# Patient Record
Sex: Male | Born: 1940
Health system: Southern US, Community
[De-identification: ages and names within clinical notes are randomized; demographics above are authoritative.]

## PROBLEM LIST (undated history)

## (undated) DIAGNOSIS — E785 Hyperlipidemia, unspecified: Secondary | ICD-10-CM

## (undated) DIAGNOSIS — E119 Type 2 diabetes mellitus without complications: Secondary | ICD-10-CM

## (undated) DIAGNOSIS — E1110 Type 2 diabetes mellitus with ketoacidosis without coma: Secondary | ICD-10-CM

## (undated) DIAGNOSIS — N189 Chronic kidney disease, unspecified: Secondary | ICD-10-CM

## (undated) DIAGNOSIS — C801 Malignant (primary) neoplasm, unspecified: Secondary | ICD-10-CM

## (undated) DIAGNOSIS — I10 Essential (primary) hypertension: Secondary | ICD-10-CM

## (undated) DIAGNOSIS — H409 Unspecified glaucoma: Secondary | ICD-10-CM

## (undated) DIAGNOSIS — N179 Acute kidney failure, unspecified: Secondary | ICD-10-CM

## (undated) HISTORY — DX: Unspecified glaucoma: H40.9

## (undated) HISTORY — PX: IRIDOTOMY / IRIDECTOMY: SHX165

## (undated) HISTORY — DX: Chronic kidney disease, unspecified: N18.9

## (undated) HISTORY — DX: Hyperlipidemia, unspecified: E78.5

## (undated) HISTORY — PX: OTHER SURGICAL HISTORY: SHX169

---

## 1998-12-21 ENCOUNTER — Encounter: Payer: Self-pay | Admitting: Cardiology

## 1998-12-21 ENCOUNTER — Ambulatory Visit (HOSPITAL_COMMUNITY): Admission: RE | Admit: 1998-12-21 | Discharge: 1998-12-21 | Payer: Self-pay | Admitting: Cardiology

## 2000-06-14 ENCOUNTER — Ambulatory Visit (HOSPITAL_COMMUNITY): Admission: RE | Admit: 2000-06-14 | Discharge: 2000-06-14 | Payer: Self-pay | Admitting: Cardiology

## 2000-07-31 ENCOUNTER — Ambulatory Visit (HOSPITAL_COMMUNITY): Admission: RE | Admit: 2000-07-31 | Discharge: 2000-07-31 | Payer: Self-pay | Admitting: *Deleted

## 2012-08-17 LAB — MICROALBUMIN, URINE: Microalb, Ur: 23.5

## 2012-10-26 LAB — HM HEPATITIS C SCREENING LAB: HM HEPATITIS C SCREENING: NEGATIVE

## 2015-02-05 LAB — PSA: PSA: <0.1

## 2015-05-07 LAB — BASIC METABOLIC PANEL
BUN: 41 — AB (ref 4–21)
Creatinine: 2 — AB (ref 0.6–1.3)
Glucose: 437
Potassium: 5.9 — AB (ref 3.4–5.3)
Sodium: 132 — AB (ref 137–147)

## 2015-05-07 LAB — CBC AND DIFFERENTIAL
HCT: 42 (ref 41–53)
HEMOGLOBIN: 13.3 — AB (ref 13.5–17.5)
NEUTROS ABS: 3
PLATELETS: 292 (ref 150–399)
WBC: 6.1

## 2015-05-07 LAB — LIPID PANEL
CHOLESTEROL: 155 (ref 0–200)
HDL: 43 (ref 35–70)
LDL CALC: 81
TRIGLYCERIDES: 157 (ref 40–160)

## 2015-05-07 LAB — HEPATIC FUNCTION PANEL
ALK PHOS: 95 (ref 25–125)
ALT: 20 (ref 10–40)
AST: 14 (ref 14–40)
Bilirubin, Total: 0.3

## 2015-05-07 LAB — HEMOGLOBIN A1C: Hemoglobin A1C: 12.2

## 2015-05-07 LAB — VITAMIN B12: Vitamin B-12: 904

## 2015-06-17 ENCOUNTER — Inpatient Hospital Stay (HOSPITAL_COMMUNITY): Payer: Medicare HMO

## 2015-06-17 ENCOUNTER — Emergency Department (HOSPITAL_COMMUNITY): Payer: Medicare HMO

## 2015-06-17 ENCOUNTER — Other Ambulatory Visit: Payer: Self-pay

## 2015-06-17 ENCOUNTER — Encounter (HOSPITAL_COMMUNITY): Payer: Self-pay | Admitting: *Deleted

## 2015-06-17 ENCOUNTER — Inpatient Hospital Stay (HOSPITAL_COMMUNITY)
Admission: EM | Admit: 2015-06-17 | Discharge: 2015-07-15 | DRG: 853 | Disposition: A | Discharge status: Skilled Nursing Facility | Payer: Medicare HMO | Attending: Internal Medicine | Admitting: Internal Medicine

## 2015-06-17 DIAGNOSIS — R140 Abdominal distension (gaseous): Secondary | ICD-10-CM

## 2015-06-17 DIAGNOSIS — I447 Left bundle-branch block, unspecified: Secondary | ICD-10-CM | POA: Diagnosis present

## 2015-06-17 DIAGNOSIS — Z978 Presence of other specified devices: Secondary | ICD-10-CM

## 2015-06-17 DIAGNOSIS — D509 Iron deficiency anemia, unspecified: Secondary | ICD-10-CM | POA: Diagnosis present

## 2015-06-17 DIAGNOSIS — K5900 Constipation, unspecified: Secondary | ICD-10-CM | POA: Diagnosis present

## 2015-06-17 DIAGNOSIS — I248 Other forms of acute ischemic heart disease: Secondary | ICD-10-CM | POA: Diagnosis present

## 2015-06-17 DIAGNOSIS — K920 Hematemesis: Secondary | ICD-10-CM | POA: Diagnosis present

## 2015-06-17 DIAGNOSIS — I480 Paroxysmal atrial fibrillation: Secondary | ICD-10-CM | POA: Diagnosis not present

## 2015-06-17 DIAGNOSIS — N179 Acute kidney failure, unspecified: Secondary | ICD-10-CM | POA: Diagnosis not present

## 2015-06-17 DIAGNOSIS — Z992 Dependence on renal dialysis: Secondary | ICD-10-CM | POA: Diagnosis not present

## 2015-06-17 DIAGNOSIS — E1122 Type 2 diabetes mellitus with diabetic chronic kidney disease: Secondary | ICD-10-CM | POA: Diagnosis present

## 2015-06-17 DIAGNOSIS — K5669 Other intestinal obstruction: Secondary | ICD-10-CM | POA: Diagnosis not present

## 2015-06-17 DIAGNOSIS — I2510 Atherosclerotic heart disease of native coronary artery without angina pectoris: Secondary | ICD-10-CM | POA: Diagnosis present

## 2015-06-17 DIAGNOSIS — R5381 Other malaise: Secondary | ICD-10-CM | POA: Diagnosis present

## 2015-06-17 DIAGNOSIS — K3189 Other diseases of stomach and duodenum: Secondary | ICD-10-CM | POA: Diagnosis present

## 2015-06-17 DIAGNOSIS — R52 Pain, unspecified: Secondary | ICD-10-CM

## 2015-06-17 DIAGNOSIS — I10 Essential (primary) hypertension: Secondary | ICD-10-CM

## 2015-06-17 DIAGNOSIS — M6282 Rhabdomyolysis: Secondary | ICD-10-CM | POA: Diagnosis present

## 2015-06-17 DIAGNOSIS — E1169 Type 2 diabetes mellitus with other specified complication: Secondary | ICD-10-CM | POA: Diagnosis present

## 2015-06-17 DIAGNOSIS — E1140 Type 2 diabetes mellitus with diabetic neuropathy, unspecified: Secondary | ICD-10-CM | POA: Diagnosis present

## 2015-06-17 DIAGNOSIS — R7989 Other specified abnormal findings of blood chemistry: Secondary | ICD-10-CM | POA: Diagnosis present

## 2015-06-17 DIAGNOSIS — M25511 Pain in right shoulder: Secondary | ICD-10-CM | POA: Diagnosis present

## 2015-06-17 DIAGNOSIS — E1129 Type 2 diabetes mellitus with other diabetic kidney complication: Secondary | ICD-10-CM | POA: Diagnosis present

## 2015-06-17 DIAGNOSIS — Z794 Long term (current) use of insulin: Secondary | ICD-10-CM

## 2015-06-17 DIAGNOSIS — Z4659 Encounter for fitting and adjustment of other gastrointestinal appliance and device: Secondary | ICD-10-CM

## 2015-06-17 DIAGNOSIS — M869 Osteomyelitis, unspecified: Secondary | ICD-10-CM | POA: Diagnosis present

## 2015-06-17 DIAGNOSIS — Z79899 Other long term (current) drug therapy: Secondary | ICD-10-CM

## 2015-06-17 DIAGNOSIS — I4891 Unspecified atrial fibrillation: Secondary | ICD-10-CM | POA: Diagnosis not present

## 2015-06-17 DIAGNOSIS — E872 Acidosis, unspecified: Secondary | ICD-10-CM | POA: Diagnosis present

## 2015-06-17 DIAGNOSIS — E860 Dehydration: Secondary | ICD-10-CM | POA: Diagnosis not present

## 2015-06-17 DIAGNOSIS — E11628 Type 2 diabetes mellitus with other skin complications: Secondary | ICD-10-CM | POA: Diagnosis present

## 2015-06-17 DIAGNOSIS — E1152 Type 2 diabetes mellitus with diabetic peripheral angiopathy with gangrene: Secondary | ICD-10-CM | POA: Diagnosis present

## 2015-06-17 DIAGNOSIS — N170 Acute kidney failure with tubular necrosis: Secondary | ICD-10-CM | POA: Diagnosis present

## 2015-06-17 DIAGNOSIS — E1165 Type 2 diabetes mellitus with hyperglycemia: Secondary | ICD-10-CM | POA: Diagnosis present

## 2015-06-17 DIAGNOSIS — E1121 Type 2 diabetes mellitus with diabetic nephropathy: Secondary | ICD-10-CM | POA: Diagnosis present

## 2015-06-17 DIAGNOSIS — A419 Sepsis, unspecified organism: Principal | ICD-10-CM | POA: Diagnosis present

## 2015-06-17 DIAGNOSIS — R0989 Other specified symptoms and signs involving the circulatory and respiratory systems: Secondary | ICD-10-CM | POA: Diagnosis not present

## 2015-06-17 DIAGNOSIS — M25512 Pain in left shoulder: Secondary | ICD-10-CM | POA: Diagnosis present

## 2015-06-17 DIAGNOSIS — E875 Hyperkalemia: Secondary | ICD-10-CM | POA: Diagnosis present

## 2015-06-17 DIAGNOSIS — R413 Other amnesia: Secondary | ICD-10-CM | POA: Diagnosis present

## 2015-06-17 DIAGNOSIS — L03032 Cellulitis of left toe: Secondary | ICD-10-CM | POA: Diagnosis present

## 2015-06-17 DIAGNOSIS — E1159 Type 2 diabetes mellitus with other circulatory complications: Secondary | ICD-10-CM | POA: Diagnosis present

## 2015-06-17 DIAGNOSIS — I120 Hypertensive chronic kidney disease with stage 5 chronic kidney disease or end stage renal disease: Secondary | ICD-10-CM | POA: Diagnosis present

## 2015-06-17 DIAGNOSIS — R778 Other specified abnormalities of plasma proteins: Secondary | ICD-10-CM | POA: Diagnosis present

## 2015-06-17 DIAGNOSIS — N186 End stage renal disease: Secondary | ICD-10-CM | POA: Diagnosis not present

## 2015-06-17 DIAGNOSIS — K766 Portal hypertension: Secondary | ICD-10-CM | POA: Diagnosis present

## 2015-06-17 DIAGNOSIS — E871 Hypo-osmolality and hyponatremia: Secondary | ICD-10-CM | POA: Diagnosis present

## 2015-06-17 DIAGNOSIS — E8809 Other disorders of plasma-protein metabolism, not elsewhere classified: Secondary | ICD-10-CM | POA: Diagnosis present

## 2015-06-17 DIAGNOSIS — M542 Cervicalgia: Secondary | ICD-10-CM | POA: Diagnosis present

## 2015-06-17 DIAGNOSIS — E11621 Type 2 diabetes mellitus with foot ulcer: Secondary | ICD-10-CM | POA: Diagnosis present

## 2015-06-17 DIAGNOSIS — K559 Vascular disorder of intestine, unspecified: Secondary | ICD-10-CM | POA: Diagnosis present

## 2015-06-17 DIAGNOSIS — E785 Hyperlipidemia, unspecified: Secondary | ICD-10-CM | POA: Diagnosis present

## 2015-06-17 DIAGNOSIS — E1310 Other specified diabetes mellitus with ketoacidosis without coma: Secondary | ICD-10-CM

## 2015-06-17 DIAGNOSIS — E669 Obesity, unspecified: Secondary | ICD-10-CM | POA: Diagnosis present

## 2015-06-17 DIAGNOSIS — K2971 Gastritis, unspecified, with bleeding: Secondary | ICD-10-CM | POA: Diagnosis present

## 2015-06-17 DIAGNOSIS — D72829 Elevated white blood cell count, unspecified: Secondary | ICD-10-CM | POA: Diagnosis not present

## 2015-06-17 DIAGNOSIS — K7460 Unspecified cirrhosis of liver: Secondary | ICD-10-CM | POA: Diagnosis present

## 2015-06-17 DIAGNOSIS — K56609 Unspecified intestinal obstruction, unspecified as to partial versus complete obstruction: Secondary | ICD-10-CM | POA: Diagnosis present

## 2015-06-17 DIAGNOSIS — I152 Hypertension secondary to endocrine disorders: Secondary | ICD-10-CM | POA: Diagnosis present

## 2015-06-17 DIAGNOSIS — L97529 Non-pressure chronic ulcer of other part of left foot with unspecified severity: Secondary | ICD-10-CM | POA: Diagnosis present

## 2015-06-17 DIAGNOSIS — N185 Chronic kidney disease, stage 5: Secondary | ICD-10-CM | POA: Diagnosis not present

## 2015-06-17 DIAGNOSIS — L089 Local infection of the skin and subcutaneous tissue, unspecified: Secondary | ICD-10-CM | POA: Diagnosis present

## 2015-06-17 DIAGNOSIS — K5660 Unspecified intestinal obstruction: Secondary | ICD-10-CM | POA: Diagnosis present

## 2015-06-17 DIAGNOSIS — Z833 Family history of diabetes mellitus: Secondary | ICD-10-CM

## 2015-06-17 DIAGNOSIS — Z8546 Personal history of malignant neoplasm of prostate: Secondary | ICD-10-CM

## 2015-06-17 DIAGNOSIS — N2581 Secondary hyperparathyroidism of renal origin: Secondary | ICD-10-CM | POA: Diagnosis present

## 2015-06-17 DIAGNOSIS — K567 Ileus, unspecified: Secondary | ICD-10-CM | POA: Diagnosis not present

## 2015-06-17 DIAGNOSIS — R5383 Other fatigue: Secondary | ICD-10-CM | POA: Diagnosis present

## 2015-06-17 DIAGNOSIS — N183 Chronic kidney disease, stage 3 (moderate): Secondary | ICD-10-CM | POA: Diagnosis present

## 2015-06-17 DIAGNOSIS — Z6827 Body mass index (BMI) 27.0-27.9, adult: Secondary | ICD-10-CM | POA: Diagnosis not present

## 2015-06-17 DIAGNOSIS — T17908A Unspecified foreign body in respiratory tract, part unspecified causing other injury, initial encounter: Secondary | ICD-10-CM

## 2015-06-17 DIAGNOSIS — R509 Fever, unspecified: Secondary | ICD-10-CM

## 2015-06-17 DIAGNOSIS — N178 Other acute kidney failure: Secondary | ICD-10-CM | POA: Diagnosis not present

## 2015-06-17 DIAGNOSIS — D638 Anemia in other chronic diseases classified elsewhere: Secondary | ICD-10-CM | POA: Diagnosis present

## 2015-06-17 DIAGNOSIS — Z9119 Patient's noncompliance with other medical treatment and regimen: Secondary | ICD-10-CM | POA: Diagnosis not present

## 2015-06-17 DIAGNOSIS — E119 Type 2 diabetes mellitus without complications: Secondary | ICD-10-CM | POA: Diagnosis present

## 2015-06-17 HISTORY — DX: Type 2 diabetes mellitus without complications: E11.9

## 2015-06-17 HISTORY — DX: Malignant (primary) neoplasm, unspecified: C80.1

## 2015-06-17 HISTORY — DX: Type 2 diabetes mellitus with ketoacidosis without coma: E11.10

## 2015-06-17 HISTORY — DX: Essential (primary) hypertension: I10

## 2015-06-17 HISTORY — DX: Acute kidney failure, unspecified: N17.9

## 2015-06-17 LAB — BASIC METABOLIC PANEL
ANION GAP: 16 — AB (ref 5–15)
ANION GAP: 15 (ref 5–15)
BUN: 60 mg/dL — ABNORMAL HIGH (ref 6–20)
BUN: 64 mg/dL — AB (ref 6–20)
CHLORIDE: 95 mmol/L — AB (ref 101–111)
CHLORIDE: 95 mmol/L — AB (ref 101–111)
CO2: 18 mmol/L — AB (ref 22–32)
CO2: 23 mmol/L (ref 22–32)
CREATININE: 4.01 mg/dL — AB (ref 0.61–1.24)
Calcium: 8.8 mg/dL — ABNORMAL LOW (ref 8.9–10.3)
Calcium: 9.1 mg/dL (ref 8.9–10.3)
Creatinine, Ser: 4.18 mg/dL — ABNORMAL HIGH (ref 0.61–1.24)
GFR calc Af Amer: 15 mL/min — ABNORMAL LOW (ref >60)
GFR calc non Af Amer: 14 mL/min — ABNORMAL LOW (ref >60)
GFR calc non Af Amer: 13 mL/min — ABNORMAL LOW (ref >60)
GFR, EST AFRICAN AMERICAN: 16 mL/min — AB (ref >60)
GLUCOSE: 332 mg/dL — AB (ref 65–99)
Glucose, Bld: 454 mg/dL — ABNORMAL HIGH (ref 65–99)
POTASSIUM: 6.3 mmol/L — AB (ref 3.5–5.1)
Potassium: 6 mmol/L — ABNORMAL HIGH (ref 3.5–5.1)
Sodium: 129 mmol/L — ABNORMAL LOW (ref 135–145)
Sodium: 133 mmol/L — ABNORMAL LOW (ref 135–145)

## 2015-06-17 LAB — CBC
HCT: 35.1 % — ABNORMAL LOW (ref 39.0–52.0)
HEMATOCRIT: 37 % — AB (ref 39.0–52.0)
HEMOGLOBIN: 11.4 g/dL — AB (ref 13.0–17.0)
Hemoglobin: 12.1 g/dL — ABNORMAL LOW (ref 13.0–17.0)
MCH: 27.4 pg (ref 26.0–34.0)
MCH: 27.9 pg (ref 26.0–34.0)
MCHC: 32.5 g/dL (ref 30.0–36.0)
MCHC: 32.7 g/dL (ref 30.0–36.0)
MCV: 84.4 fL (ref 78.0–100.0)
MCV: 85.5 fL (ref 78.0–100.0)
PLATELETS: 196 10*3/uL (ref 150–400)
Platelets: 208 10*3/uL (ref 150–400)
RBC: 4.16 MIL/uL — AB (ref 4.22–5.81)
RBC: 4.33 MIL/uL (ref 4.22–5.81)
RDW: 13.8 % (ref 11.5–15.5)
RDW: 13.9 % (ref 11.5–15.5)
WBC: 16.2 10*3/uL — ABNORMAL HIGH (ref 4.0–10.5)
WBC: 16.1 10*3/uL — AB (ref 4.0–10.5)

## 2015-06-17 LAB — URINALYSIS, ROUTINE W REFLEX MICROSCOPIC
Bilirubin Urine: NEGATIVE
Ketones, ur: NEGATIVE mg/dL
Leukocytes, UA: NEGATIVE
Nitrite: NEGATIVE
PH: 5 (ref 5.0–8.0)
Protein, ur: 100 mg/dL — AB
SPECIFIC GRAVITY, URINE: 1.023 (ref 1.005–1.030)

## 2015-06-17 LAB — URINE MICROSCOPIC-ADD ON

## 2015-06-17 LAB — CK
Total CK: 3053 U/L — ABNORMAL HIGH (ref 49–397)
Total CK: 2960 U/L — ABNORMAL HIGH (ref 49–397)

## 2015-06-17 LAB — PROCALCITONIN: Procalcitonin: 35.32 ng/mL

## 2015-06-17 LAB — HEPATIC FUNCTION PANEL
ALBUMIN: 2.9 g/dL — AB (ref 3.5–5.0)
ALK PHOS: 91 U/L (ref 38–126)
ALT: 29 U/L (ref 17–63)
AST: 73 U/L — AB (ref 15–41)
BILIRUBIN TOTAL: 0.6 mg/dL (ref 0.3–1.2)
Bilirubin, Direct: 0.3 mg/dL (ref 0.1–0.5)
Indirect Bilirubin: 0.3 mg/dL (ref 0.3–0.9)
Total Protein: 6.6 g/dL (ref 6.5–8.1)

## 2015-06-17 LAB — GLUCOSE, CAPILLARY: Glucose-Capillary: 281 mg/dL — ABNORMAL HIGH (ref 65–99)

## 2015-06-17 LAB — SEDIMENTATION RATE: Sed Rate: 53 mm/hr — ABNORMAL HIGH (ref 0–16)

## 2015-06-17 LAB — TROPONIN I
TROPONIN I: 0.19 ng/mL — AB (ref <0.031)
Troponin I: 0.05 ng/mL — ABNORMAL HIGH (ref <0.031)

## 2015-06-17 LAB — PHOSPHORUS: PHOSPHORUS: 6 mg/dL — AB (ref 2.5–4.6)

## 2015-06-17 LAB — CBG MONITORING, ED: Glucose-Capillary: 316 mg/dL — ABNORMAL HIGH (ref 65–99)

## 2015-06-17 LAB — MAGNESIUM: MAGNESIUM: 2.2 mg/dL (ref 1.7–2.4)

## 2015-06-17 MED ORDER — ONDANSETRON HCL 4 MG/2ML IJ SOLN: 4.0000 mg | Freq: Four times a day (QID) | INTRAMUSCULAR | Status: DC | PRN

## 2015-06-17 MED ORDER — SODIUM CHLORIDE 0.9 % IJ SOLN
3.0000 mL | Freq: Two times a day (BID) | INTRAMUSCULAR | Status: DC
  Administered 2015-06-18 – 2015-07-15 (×46): 3 mL via INTRAVENOUS

## 2015-06-17 MED ORDER — HEPARIN SODIUM (PORCINE) 5000 UNIT/ML IJ SOLN
5000.0000 [IU] | Freq: Three times a day (TID) | INTRAMUSCULAR | Status: DC
  Administered 2015-06-17 – 2015-06-18 (×2): 5000 [IU] via SUBCUTANEOUS
  Filled 2015-06-17 (×2): qty 1

## 2015-06-17 MED ORDER — INSULIN ASPART 100 UNIT/ML ~~LOC~~ SOLN: 0.0000 [IU] | Freq: Three times a day (TID) | SUBCUTANEOUS | Status: DC

## 2015-06-17 MED ORDER — ACETAMINOPHEN 650 MG RE SUPP: 650.0000 mg | Freq: Four times a day (QID) | RECTAL | Status: DC | PRN

## 2015-06-17 MED ORDER — PIPERACILLIN-TAZOBACTAM IN DEX 2-0.25 GM/50ML IV SOLN
2.2500 g | Freq: Three times a day (TID) | INTRAVENOUS | Status: DC
  Administered 2015-06-18 – 2015-06-22 (×13): 2.25 g via INTRAVENOUS
  Filled 2015-06-17 (×22): qty 50

## 2015-06-17 MED ORDER — SODIUM CHLORIDE 0.9 % IV SOLN
INTRAVENOUS | Status: DC
  Administered 2015-06-17: 23:00:00 via INTRAVENOUS

## 2015-06-17 MED ORDER — INSULIN REGULAR BOLUS VIA INFUSION: 0.0000 [IU] | Freq: Three times a day (TID) | INTRAVENOUS | Status: DC

## 2015-06-17 MED ORDER — VANCOMYCIN HCL IN DEXTROSE 1-5 GM/200ML-% IV SOLN
1000.0000 mg | Freq: Once | INTRAVENOUS | Status: AC
  Administered 2015-06-18: 1000 mg via INTRAVENOUS
  Filled 2015-06-17: qty 200

## 2015-06-17 MED ORDER — SODIUM CHLORIDE 0.9 % IV SOLN
1.0000 g | Freq: Once | INTRAVENOUS | Status: AC
  Administered 2015-06-18: 1 g via INTRAVENOUS
  Filled 2015-06-17: qty 10

## 2015-06-17 MED ORDER — VANCOMYCIN HCL IN DEXTROSE 1-5 GM/200ML-% IV SOLN: 1000.0000 mg | INTRAVENOUS | Status: DC

## 2015-06-17 MED ORDER — PIPERACILLIN-TAZOBACTAM 3.375 G IVPB 30 MIN
3.3750 g | Freq: Once | INTRAVENOUS | Status: AC
  Administered 2015-06-17: 3.375 g via INTRAVENOUS
  Filled 2015-06-17: qty 50

## 2015-06-17 MED ORDER — DEXTROSE 50 % IV SOLN: 25.0000 mL | INTRAVENOUS | Status: DC | PRN

## 2015-06-17 MED ORDER — ONDANSETRON HCL 4 MG PO TABS: 4.0000 mg | ORAL_TABLET | Freq: Four times a day (QID) | ORAL | Status: DC | PRN

## 2015-06-17 MED ORDER — SODIUM CHLORIDE 0.9 % IV BOLUS (SEPSIS)
500.0000 mL | Freq: Once | INTRAVENOUS | Status: AC
  Administered 2015-06-17: 500 mL via INTRAVENOUS

## 2015-06-17 MED ORDER — DOCUSATE SODIUM 100 MG PO CAPS
100.0000 mg | ORAL_CAPSULE | Freq: Two times a day (BID) | ORAL | Status: DC
  Administered 2015-06-17 – 2015-06-18 (×3): 100 mg via ORAL
  Filled 2015-06-17 (×4): qty 1

## 2015-06-17 MED ORDER — ZOLPIDEM TARTRATE 5 MG PO TABS
5.0000 mg | ORAL_TABLET | Freq: Once | ORAL | Status: AC
  Administered 2015-06-17: 5 mg via ORAL
  Filled 2015-06-17: qty 1

## 2015-06-17 MED ORDER — INSULIN ASPART 100 UNIT/ML ~~LOC~~ SOLN
10.0000 [IU] | Freq: Once | SUBCUTANEOUS | Status: AC
  Administered 2015-06-18: 10 [IU] via INTRAVENOUS

## 2015-06-17 MED ORDER — INSULIN ASPART 100 UNIT/ML IV SOLN
10.0000 [IU] | Freq: Once | INTRAVENOUS | Status: AC
  Administered 2015-06-17: 10 [IU] via INTRAVENOUS
  Filled 2015-06-17: qty 1

## 2015-06-17 MED ORDER — OXYCODONE-ACETAMINOPHEN 5-325 MG PO TABS
1.0000 | ORAL_TABLET | Freq: Once | ORAL | Status: AC
  Administered 2015-06-17: 1 via ORAL
  Filled 2015-06-17: qty 1

## 2015-06-17 MED ORDER — SENNA 8.6 MG PO TABS
1.0000 | ORAL_TABLET | Freq: Two times a day (BID) | ORAL | Status: DC
  Administered 2015-06-17 – 2015-06-18 (×3): 8.6 mg via ORAL
  Filled 2015-06-17 (×4): qty 1

## 2015-06-17 MED ORDER — VITAMIN B-1 100 MG PO TABS
100.0000 mg | ORAL_TABLET | Freq: Every day | ORAL | Status: DC
  Administered 2015-06-18: 100 mg via ORAL
  Filled 2015-06-17 (×2): qty 1

## 2015-06-17 MED ORDER — POLYETHYLENE GLYCOL 3350 17 G PO PACK: 17.0000 g | PACK | Freq: Every day | ORAL | Status: DC | PRN

## 2015-06-17 MED ORDER — SODIUM CHLORIDE 0.9 % IV BOLUS (SEPSIS)
1000.0000 mL | Freq: Once | INTRAVENOUS | Status: AC
  Administered 2015-06-17: 1000 mL via INTRAVENOUS

## 2015-06-17 MED ORDER — HYDROCODONE-ACETAMINOPHEN 5-325 MG PO TABS
1.0000 | ORAL_TABLET | ORAL | Status: DC | PRN
  Administered 2015-06-19: 1 via ORAL
  Filled 2015-06-17: qty 1

## 2015-06-17 MED ORDER — SODIUM CHLORIDE 0.9 % IV SOLN
INTRAVENOUS | Status: DC
  Administered 2015-06-17: 2.2 [IU]/h via INTRAVENOUS
  Filled 2015-06-17: qty 2.5

## 2015-06-17 MED ORDER — DEXTROSE 50 % IV SOLN
1.0000 | Freq: Once | INTRAVENOUS | Status: AC
  Administered 2015-06-18: 50 mL via INTRAVENOUS
  Filled 2015-06-17: qty 50

## 2015-06-17 MED ORDER — ACETAMINOPHEN 325 MG PO TABS: 650.0000 mg | ORAL_TABLET | Freq: Four times a day (QID) | ORAL | Status: DC | PRN

## 2015-06-17 MED ORDER — FOLIC ACID 1 MG PO TABS
1.0000 mg | ORAL_TABLET | Freq: Every day | ORAL | Status: DC
  Administered 2015-06-18: 1 mg via ORAL
  Filled 2015-06-17 (×2): qty 1

## 2015-06-17 MED ORDER — SODIUM POLYSTYRENE SULFONATE 15 GM/60ML PO SUSP
30.0000 g | Freq: Once | ORAL | Status: AC
  Administered 2015-06-17: 30 g via ORAL
  Filled 2015-06-17: qty 120

## 2015-06-17 NOTE — ED Provider Notes (Signed)
CSN: [id number]     Arrival date & time 06/17/15  1641 History   First MD Initiated Contact with Patient 06/17/15 1702     Chief Complaint  Patient presents with  . Shoulder Pain     Patient is a 74 y.o. male presenting with weakness. The history is provided by the patient.  Weakness This is a new problem. The current episode started 2 days ago. The problem occurs daily. The problem has been gradually worsening. Pertinent negatives include no chest pain, no abdominal pain, no headaches and no shortness of breath. The symptoms are aggravated by walking. The symptoms are relieved by rest.  pt reports for 2 days he has had no energy He reports yesterday while trying to get off couch he fell onto floor but denies injury - no head injury or LOC, no extremity injury reported He reports pain in both shoulders and also "feet feel weak" He reports he has no energy   No fever/vomiting/diarrhea No HA No CP/SOB No new meds PCP is in [place]   Past Medical History  Diagnosis Date  . Diabetes mellitus without complication ([place])   . Hypertension    History reviewed. No pertinent past surgical history. No family history on file. Social History  Substance Use Topics  . Smoking status: Never Smoker   . Smokeless tobacco: None  . Alcohol Use: Yes    Review of Systems  Constitutional: Positive for fatigue. Negative for fever.  Respiratory: Negative for shortness of breath.   Cardiovascular: Negative for chest pain.  Gastrointestinal: Negative for vomiting, abdominal pain, diarrhea and blood in stool.  Musculoskeletal: Positive for myalgias and arthralgias. Negative for back pain and neck pain.  Neurological: Positive for weakness. Negative for headaches.  All other systems reviewed and are negative.     Allergies  Review of patient's allergies indicates no known allergies.  Home Medications   Prior to Admission medications   Medication Sig Start Date End Date Taking? Authorizing  Provider  AMLODIPINE BESYLATE PO Take by mouth.   Yes Historical Provider, MD  CARVEDILOL PO Take by mouth.   Yes Historical Provider, MD  GABAPENTIN PO Take by mouth.   Yes Historical Provider, MD  HYDRALAZINE HCL PO Take by mouth.   Yes Historical Provider, MD  Linagliptin (TRADJENTA PO) Take by mouth.   Yes Historical Provider, MD  LISINOPRIL PO Take by mouth.   Yes Historical Provider, MD  Rosuvastatin Calcium (CRESTOR PO) Take by mouth.   Yes Historical Provider, MD  TRAZODONE HCL PO Take by mouth.   Yes Historical Provider, MD   BP 159/71 mmHg  Pulse 88  Temp(Src) 98.5 F (36.9 C) (Oral)  Resp 15  SpO2 93% Physical Exam CONSTITUTIONAL: Well developed/well nourished, pt smells ketotic HEAD: Normocephalic/atraumatic EYES: EOMI/PERRL ENMT: Mucous membranes dry NECK: supple no meningeal signs SPINE/BACK:entire spine nontender CV: S1/S2 noted, no murmurs/rubs/gallops noted LUNGS: Lungs are clear to auscultation bilaterally, no apparent distress ABDOMEN: soft, nontender, no rebound or guarding, bowel sounds noted throughout abdomen GU:no cva tenderness NEURO: Pt is awake/alert/appropriate, moves all extremitiesx4.  No facial droop.  No arm or leg drift.   EXTREMITIES: pulses normal/equal, full ROM, no tenderness/erythema to either shoulder.  No deformity noted. He has maceration to toes of left foot with foul smelling discharge but no large open wounds noted SKIN: warm, color normal PSYCH: no abnormalities of mood noted, alert and oriented to situation  ED Course  Procedures  CRITICAL CARE Performed by: Sharyon Cable Total critical  care time: 35 minutes Critical care time was exclusive of separately billable procedures and treating other patients. Critical care was necessary to treat or prevent imminent or life-threatening deterioration. Critical care was time spent personally by me on the following activities: development of treatment plan with patient and/or surrogate as  well as nursing, discussions with consultants, evaluation of patient's response to treatment, examination of patient, obtaining history from patient or surrogate, ordering and performing treatments and interventions, ordering and review of laboratory studies, ordering and review of radiographic studies, pulse oximetry and re-evaluation of patient's condition. PATIENT WITH ACUTE RENAL FAILURE, HYPERKALEMIA AND DEHYDRATION REQUIRING IV INSULIN  5:42 PM Pt with fatigue for two days Reports arthralgias of unclear etiology (no signs of trauma) Imaging/labs pending 7:45 PM PT WITH MULTIPLE LAB ABNORMALITIES HE IS IN RHABDOMYOLYSIS, ACUTE RENAL FAILURE AND HYPERKALEMIA HE IS ALSO IN MILD DKA WILL NEED ADMISSION D/W DR DOUTOVA, WILL ADMIT SHE REQUESTS IV INSULIN AND ALSO BLADDER SCAN TO SEE IF HE HAS ANY SIGNS OF URINARY OBSTRUCTION  Labs Review Labs Reviewed  BASIC METABOLIC PANEL - Abnormal; Notable for the following:    Sodium 129 (*)    Potassium 6.0 (*)    Chloride 95 (*)    CO2 18 (*)    Glucose, Bld 454 (*)    BUN 60 (*)    Creatinine, Ser 4.01 (*)    Calcium 8.8 (*)    GFR calc non Af Amer 14 (*)    GFR calc Af Amer 16 (*)    Anion gap 16 (*)    All other components within normal limits  CBC - Abnormal; Notable for the following:    WBC 16.2 (*)    RBC 4.16 (*)    Hemoglobin 11.4 (*)    HCT 35.1 (*)    All other components within normal limits  TROPONIN I - Abnormal; Notable for the following:    Troponin I 0.05 (*)    All other components within normal limits  HEPATIC FUNCTION PANEL - Abnormal; Notable for the following:    Albumin 2.9 (*)    AST 73 (*)    All other components within normal limits  CK - Abnormal; Notable for the following:    Total CK 3053 (*)    All other components within normal limits  CBG MONITORING, ED - Abnormal; Notable for the following:    Glucose-Capillary 316 (*)    All other components within normal limits  URINALYSIS, ROUTINE W REFLEX  MICROSCOPIC (NOT AT [facility])  SEDIMENTATION RATE  CBG MONITORING, ED    Imaging Review Dg Chest 2 View  06/17/2015  CLINICAL DATA:  Weakness. EXAM: CHEST  2 VIEW COMPARISON:  None. FINDINGS: Trachea is midline. Heart size within normal limits. Lungs are clear. No pleural fluid. Degenerative changes are seen in the spine. IMPRESSION: No acute findings. Electronically Signed   By: Lorin Picket M.D.   On: 06/17/2015 18:10   Dg Foot Complete Left  06/17/2015  CLINICAL DATA:  Unable to walk.  Foot pain.  Diabetes. EXAM: LEFT FOOT - COMPLETE 3+ VIEW COMPARISON:  None. FINDINGS: Questionable soft tissue ulceration at the tips of the first and second toes. 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 No visible radiographic changes of acute osteomyelitis. No fracture, subluxation or dislocation. IMPRESSION: No radiographic changes of osteomyelitis. Electronically Signed   By: Rolm Baptise M.D.   On: 06/17/2015 18:11   I have personally reviewed and evaluated these  images and lab results as part of my medical decision-making.   EKG Interpretation   Date/Time:  Wednesday June 17 2015 16:35:42 EST Ventricular Rate:  93 PR Interval:  200 QRS Duration: 154 QT Interval:  381 QTC Calculation: 474 R Axis:   161 Text Interpretation:  Sinus rhythm Probable lateral infarct, age  indeterminate Probable anteroseptal infarct, acute Left bundle branch  block Abnormal ekg Confirmed by Christy Gentles  MD, Elenore Rota ([id number]) on 06/17/2015  4:40:21 PM     Medications  sodium chloride 0.9 % bolus 1,000 mL (not administered)  insulin regular (NOVOLIN R,HUMULIN R) 250 Units in sodium chloride 0.9 % 250 mL (1 Units/mL) infusion (not administered)  sodium chloride 0.9 % bolus 500 mL (500 mLs Intravenous New Bag/Given 06/17/15 1841)  insulin aspart (novoLOG) injection 10 Units (10 Units Intravenous Given 06/17/15 1842)  oxyCODONE-acetaminophen (PERCOCET/ROXICET) 5-325 MG per tablet 1 tablet  (1 tablet Oral Given 06/17/15 1912)    MDM   Final diagnoses:  AKI (acute kidney injury) ([place])  Diabetic ketoacidosis without coma associated with other specified diabetes mellitus ([place])  Dehydration  Non-traumatic rhabdomyolysis  Hyperkalemia    Nursing notes including past medical history and social history reviewed and considered in documentation xrays/imaging reviewed by myself and considered during evaluation Labs/vital reviewed myself and considered during evaluation     Ripley Fraise, MD 06/17/15 1947

## 2015-06-17 NOTE — H&P (Signed)
PCP: No primary care provider on file.    Referring provider Dr. Christy Gentles   Chief Complaint:  Shoulder pain  HPI: David Ford is a 74 y.o. male   has a past medical history of Diabetes mellitus without complication ([place]) and Hypertension.   Presented with a fall onto the floor from the couch.  He has communicated that has been down on the floor for past 3 days. He was not able to walk but could crawl and states could reach fridge and microwave so he ate and drank. He reports he was able able to call on the phone the entire time but did not request any help until today when he realized he was not getting any better.    He has been living in [place] for past 20 years but has a PCP in [place]. He reports flying there every 3 months.  Patient lives at home alone he was found today by the friend who called EMS. Patient has history of diabetes but his glucose meter has been broken. Patient has been fatigued. Denies any loss of consciousness patient reports some file order from his toes. Reports generalized muscle aches. Reports loss of strength in arms and legs. Reports he was able to urinate today.  He is unsure if he has any history of kidney disease. Unsure if he's been taking his medications Emergency department he was found to have evidence of rhabdomyolysis with CKs 3053, his initial lab work showing creatinine of 4 potassium was 6.0 and an gap was 16. Patient was unable to produce urine. Troponin slightly elevated to 0.05 albumin 2.9 White blood cell, was noted to be elevated 16.2 Hospitalist was called for admission for dehydration rhabdomyolysis evidence of acute renal failure  Review of Systems:    Pertinent positives include:  fatigue, weight loss   Constitutional:  No weight loss, night sweats, Fevers, chills, HEENT:  No headaches, Difficulty swallowing,Tooth/dental problems,Sore throat,  No sneezing, itching, ear ache, nasal congestion, post nasal drip,  Cardio-vascular:   No chest pain, Orthopnea, PND, anasarca, dizziness, palpitations.no Bilateral lower extremity swelling  GI:  No heartburn, indigestion, abdominal pain, nausea, vomiting, diarrhea, change in bowel habits, loss of appetite, melena, blood in stool, hematemesis Resp:  no shortness of breath at rest. No dyspnea on exertion, No excess mucus, no productive cough, No non-productive cough, No coughing up of blood.No change in color of mucus.No wheezing. Skin:  no rash or lesions. No jaundice GU:  no dysuria, change in color of urine, no urgency or frequency. No straining to urinate.  No flank pain.  Musculoskeletal:  No joint pain or no joint swelling. No decreased range of motion. No back pain.  Psych:  No change in mood or affect. No depression or anxiety. No memory loss.  Neuro: no localizing neurological complaints, no tingling, no weakness, no double vision, no gait abnormality, no slurred speech, no confusion  Otherwise ROS are negative except for above, 10 systems were reviewed  Past Medical History: Past Medical History  Diagnosis Date  . Diabetes mellitus without complication ([place])   . Hypertension    History reviewed. No pertinent past surgical history.   Medications: Prior to Admission medications   Medication Sig Start Date End Date Taking? Authorizing Provider  AMLODIPINE BESYLATE PO Take by mouth.   Yes Historical Provider, MD  CARVEDILOL PO Take by mouth.   Yes Historical Provider, MD  GABAPENTIN PO Take by mouth.   Yes Historical Provider, MD  HYDRALAZINE HCL PO Take  by mouth.   Yes Historical Provider, MD  Linagliptin (TRADJENTA PO) Take by mouth.   Yes Historical Provider, MD  LISINOPRIL PO Take by mouth.   Yes Historical Provider, MD  Rosuvastatin Calcium (CRESTOR PO) Take by mouth.   Yes Historical Provider, MD  TRAZODONE HCL PO Take by mouth.   Yes Historical Provider, MD    Allergies:  No Known Allergies  Social History:  Ambulatory  Independently  Lives  at home alone,      reports that he has never smoked. He does not have any smokeless tobacco history on file. He reports that he drinks alcohol. He reports that he does not use illicit drugs.    Family History: family history includes Diabetes in his brother, mother, and sister.    Physical Exam: Patient Vitals for the past 24 hrs:  BP Temp Temp src Pulse Resp SpO2  06/17/15 1900 120/79 mmHg - - 90 19 90 %  06/17/15 1845 148/73 mmHg - - 91 22 96 %  06/17/15 1730 159/71 mmHg - - 88 15 93 %  06/17/15 1715 (!) 161/134 mmHg - - 88 17 96 %  06/17/15 1645 144/70 mmHg - - 89 15 (!) 89 %  06/17/15 1642 165/70 mmHg 98.5 F (36.9 C) Oral 85 20 99 %    1. General:  in No Acute distress disheveled 2. Psychological: Alert and Oriented 3. Head/ENT:     Dry Mucous Membranes                          Head Non traumatic, neck supple                          Poor Dentition 4. SKIN:  decreased Skin turgor,  Skin clean Dry a wound noted on Left foot with foul smelling discharge      5. Heart: Regular rate and rhythm no Murmur, Rub or gallop 6. Lungs: Clear to auscultation bilaterally, no wheezes or crackles   7. Abdomen:  non-tender,  distended 8. Lower extremities: no clubbing, cyanosis, or edema 9. Neurologically strength 5/5 in all 4 extremities, CN 2-12 intact 10. MSK: Normal range of motion  body mass index is unknown because there is no height or weight on file.   Labs on Admission:   Results for orders placed or performed during the hospital encounter of 06/17/15 (from the past 24 hour(s))  Basic metabolic panel     Status: Abnormal   Collection Time: 06/17/15  5:08 PM  Result Value Ref Range   Sodium 129 (L) 135 - 145 mmol/L   Potassium 6.0 (H) 3.5 - 5.1 mmol/L   Chloride 95 (L) 101 - 111 mmol/L   CO2 18 (L) 22 - 32 mmol/L   Glucose, Bld 454 (H) 65 - 99 mg/dL   BUN 60 (H) 6 - 20 mg/dL   Creatinine, Ser 4.01 (H) 0.61 - 1.24 mg/dL   Calcium 8.8 (L) 8.9 - 10.3 mg/dL   GFR calc  non Af Amer 14 (L) >60 mL/min   GFR calc Af Amer 16 (L) >60 mL/min   Anion gap 16 (H) 5 - 15  CBC     Status: Abnormal   Collection Time: 06/17/15  5:08 PM  Result Value Ref Range   WBC 16.2 (H) 4.0 - 10.5 K/uL   RBC 4.16 (L) 4.22 - 5.81 MIL/uL   Hemoglobin 11.4 (L) 13.0 - 17.0 g/dL  HCT 35.1 (L) 39.0 - 52.0 %   MCV 84.4 78.0 - 100.0 fL   MCH 27.4 26.0 - 34.0 pg   MCHC 32.5 30.0 - 36.0 g/dL   RDW 13.8 11.5 - 15.5 %   Platelets 208 150 - 400 K/uL  Troponin I     Status: Abnormal   Collection Time: 06/17/15  5:08 PM  Result Value Ref Range   Troponin I 0.05 (H) <0.031 ng/mL  Hepatic function panel     Status: Abnormal   Collection Time: 06/17/15  5:08 PM  Result Value Ref Range   Total Protein 6.6 6.5 - 8.1 g/dL   Albumin 2.9 (L) 3.5 - 5.0 g/dL   AST 73 (H) 15 - 41 U/L   ALT 29 17 - 63 U/L   Alkaline Phosphatase 91 38 - 126 U/L   Total Bilirubin 0.6 0.3 - 1.2 mg/dL   Bilirubin, Direct 0.3 0.1 - 0.5 mg/dL   Indirect Bilirubin 0.3 0.3 - 0.9 mg/dL  CK     Status: Abnormal   Collection Time: 06/17/15  5:08 PM  Result Value Ref Range   Total CK 3053 (H) 49 - 397 U/L  CBG monitoring, ED     Status: Abnormal   Collection Time: 06/17/15  7:08 PM  Result Value Ref Range   Glucose-Capillary 316 (H) 65 - 99 mg/dL    UA ordered  No results found for: HGBA1C  CrCl cannot be calculated (Unknown ideal weight.).  BNP (last 3 results) No results for input(s): PROBNP in the last 8760 hours.  Other results:  I have pearsonaly reviewed this: ECG REPORT  Rate: 91  Rhythm: Left bundle-branch block ST&T Change: N/A QTC 492  There were no vitals filed for this visit.   Cultures: No results found for: [place], [place], [place], REPTSTATUS   Radiological Exams on Admission: Dg Chest 2 View  06/17/2015  CLINICAL DATA:  Weakness. EXAM: CHEST  2 VIEW COMPARISON:  None. FINDINGS: Trachea is midline. Heart size within normal limits. Lungs are clear. No pleural fluid. Degenerative  changes are seen in the spine. IMPRESSION: No acute findings. Electronically Signed   By: Lorin Picket M.D.   On: 06/17/2015 18:10   Dg Foot Complete Left  06/17/2015  CLINICAL DATA:  Unable to walk.  Foot pain.  Diabetes. EXAM: LEFT FOOT - COMPLETE 3+ VIEW COMPARISON:  None. FINDINGS: Questionable soft tissue ulceration at the tips of the first and second toes. 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 No visible radiographic changes of acute osteomyelitis. No fracture, subluxation or dislocation. IMPRESSION: No radiographic changes of osteomyelitis. Electronically Signed   By: Rolm Baptise M.D.   On: 06/17/2015 18:11    Chart has been reviewed  Family not at  Bedside  Son Malakiah Mathiesen [id number] not at bedside   Assessment/Plan 74 yo M with hx of DM, HTN  Here with generalized weakness was found to have ARF most likely due to dehydration in the setting of ACEi and was found to have untreated diabetic foot ulcer  Present on Admission:  . Diabetic foot infection ([place])  - cover with vanc and zosyn for now, no evidence of osteo per plain imaging, will need orthopedics consult in AM with Dr. Sharol Given if available . Hypertension - hold home meds , hold lisinopril . Leukocytosis - likely due to hemoconcentration and diabetic foot ulcer . Elevated troponin - in the setting of dehydration and ARF, likely due to demand ischemia  no chest pain. Will cycle and monitor on tele . Hyponatremia - corrects to 137 . Hypoalbuminemia - will check prealbumin, nutrition consult . Hyperkalemia - treat with insulin and kayexalate, follow with serial labs, monitor on tele . Rhabdomyolysis - rehydrate monitor renal function, nephrology is aware may need addition of Bicarb if evidence of worsening acidosis . Metabolic acidosis - in the setting of acute renal failure, will check urine for ketones, check lactic acid, rehydrate . Acute renal failure (ARF) ([place]) - stop lisinopril, order renal  US, make sure able to urinate ad no evidence of obstruction, obtain renal electrolytes, rehydrate . DM type 2 causing renal disease ([place]) - given hyperglycemia will start on insulin drip, diabetic education, ONCE Blood Glucose UNDER CONTROL WILL START INSULIN SLIDING scale . Dehydration - rehydrate with IV fluids . Debility - PT and OT  EVAL and treat    Prophylaxis:  Hep [place]  CODE STATUS:  FULL CODE  as per patient    Disposition:   likely will need placement for rehabilitation                        Other plan as per orders.  I have spent a total of 67 min on this admission extra time was taken to discuss care with Dr. Shary Decamp with nephrology  [place] 06/17/2015, 8:55 PM  [facility]  Pager [phone]   after 2 AM please page floor coverage PA If 7AM-7PM, please contact the day team taking care of the patient  [email or web address]  Password [email or web address]

## 2015-06-17 NOTE — ED Notes (Signed)
Report given to nurse on 6 e rm 16

## 2015-06-17 NOTE — ED Notes (Signed)
Pt taken to xray 

## 2015-06-17 NOTE — ED Notes (Signed)
Unable to locate the bladder scanner

## 2015-06-17 NOTE — ED Notes (Signed)
To x-ray

## 2015-06-17 NOTE — ED Notes (Signed)
Pt returned from xray

## 2015-06-17 NOTE — Progress Notes (Signed)
CRITICAL VALUE ALERT  Critical value received:  Potassium 6.3  Date of notification:  06/17/2015  Time of notification:  11:27 pm  Critical value read back:Yes.    Nurse who received alert:  Arlyss Queen  MD notified (1st page):  Lamar Blinks NP  Time of first page:  11:28 pm

## 2015-06-17 NOTE — Progress Notes (Signed)
ANTIBIOTIC CONSULT NOTE - INITIAL  Pharmacy Consult for Vancomycin and Zosyn Indication: wound infection  No Known Allergies  Patient Measurements: Height: 6\' 2"  (188 cm) Weight: 216 lb (97.977 kg) IBW/kg (Calculated) : 82.2 Adjusted Body Weight:   Vital Signs: Temp: 98.5 F (36.9 C) (12/28 1642) Temp Source: Oral (12/28 1642) BP: 120/79 mmHg (12/28 1900) Pulse Rate: 90 (12/28 1900) Intake/Output from previous day:   Intake/Output from this shift:    Labs:  Recent Labs  06/17/15 1708  WBC 16.2*  HGB 11.4*  PLT 208  CREATININE 4.01*   CrCl cannot be calculated (Unknown ideal weight.). No results for input(s): VANCOTROUGH, VANCOPEAK, VANCORANDOM, GENTTROUGH, GENTPEAK, GENTRANDOM, TOBRATROUGH, TOBRAPEAK, TOBRARND, AMIKACINPEAK, AMIKACINTROU, AMIKACIN in the last 72 hours.   Microbiology: No results found for this or any previous visit (from the past 720 hour(s)).  Medical History: Past Medical History  Diagnosis Date  . Diabetes mellitus without complication ([place])   . Hypertension     Medications:   (Not in a hospital admission) Scheduled:  . sodium polystyrene  30 g Oral Once   Infusions:  . insulin (NOVOLIN-R) infusion    . sodium chloride     Assessment: 74yo male with history of DM2 and HTN presents after mechanical fall. Pharmacy is consulted to dose vancomycin and zosyn for suspected wound infection. Pt is afebrile, WBC 16.2 and sCr 4.01.  Pt received vancomycin 1g and zosyn 3.375g IV once in the ED.  Goal of Therapy:  Vancomycin trough level 15-20 mcg/ml  Plan:  Vancomycin 1g IV q48h though will increase if renal function improves Zosyn 2.25g IV q8h Measure antibiotic drug levels at steady state Follow up culture results, renal function and clinical course  Andrey Cota. Diona Foley, PharmD, [place] Clinical Pharmacist Pager [phone] 06/17/2015,9:07 PM

## 2015-06-17 NOTE — ED Notes (Signed)
hospitalist here seeing the pt

## 2015-06-17 NOTE — ED Notes (Signed)
The pt lives alone and has friends that come to check on him.  Today he was sitting on the couch felt weak and slid down onto the   Floor.  He crawled into the br and that is where a friend  Came in and called ems.  He is alert oriented skin warm  And dry.  He is a diabetic and reports that his glucose meter is broken  For several days.  He has  Toes on the lt foot that has a very bad odor and he reports that he almost lost his lt great toe 2 years ago.  Dirty  Spray soap placed on the lt toes and cleaned gently  And slightly

## 2015-06-17 NOTE — ED Notes (Signed)
The pt  Talking with  A friend.  He has also spoken with his son on the phone  And he told him that he had been lying on the floor for 3 days before anyone  Came by the house.

## 2015-06-17 NOTE — ED Notes (Signed)
pts  Son in [place] number son malloy colletta [phone]   [place] [address]  Number [phone]

## 2015-06-18 ENCOUNTER — Encounter (HOSPITAL_COMMUNITY): Payer: Self-pay | Admitting: General Practice

## 2015-06-18 ENCOUNTER — Inpatient Hospital Stay (HOSPITAL_COMMUNITY): Payer: Medicare HMO

## 2015-06-18 DIAGNOSIS — R7989 Other specified abnormal findings of blood chemistry: Secondary | ICD-10-CM

## 2015-06-18 DIAGNOSIS — E871 Hypo-osmolality and hyponatremia: Secondary | ICD-10-CM

## 2015-06-18 DIAGNOSIS — N179 Acute kidney failure, unspecified: Secondary | ICD-10-CM

## 2015-06-18 DIAGNOSIS — E1110 Type 2 diabetes mellitus with ketoacidosis without coma: Secondary | ICD-10-CM

## 2015-06-18 DIAGNOSIS — K567 Ileus, unspecified: Secondary | ICD-10-CM | POA: Diagnosis present

## 2015-06-18 DIAGNOSIS — E1129 Type 2 diabetes mellitus with other diabetic kidney complication: Secondary | ICD-10-CM

## 2015-06-18 DIAGNOSIS — E875 Hyperkalemia: Secondary | ICD-10-CM

## 2015-06-18 DIAGNOSIS — E872 Acidosis: Secondary | ICD-10-CM

## 2015-06-18 HISTORY — DX: Type 2 diabetes mellitus with ketoacidosis without coma: E11.10

## 2015-06-18 HISTORY — DX: Acute kidney failure, unspecified: N17.9

## 2015-06-18 LAB — BASIC METABOLIC PANEL
ANION GAP: 15 (ref 5–15)
Anion gap: 11 (ref 5–15)
Anion gap: 11 (ref 5–15)
Anion gap: 14 (ref 5–15)
Anion gap: 15 (ref 5–15)
BUN: 65 mg/dL — ABNORMAL HIGH (ref 6–20)
BUN: 67 mg/dL — ABNORMAL HIGH (ref 6–20)
BUN: 70 mg/dL — ABNORMAL HIGH (ref 6–20)
BUN: 72 mg/dL — ABNORMAL HIGH (ref 6–20)
BUN: 74 mg/dL — ABNORMAL HIGH (ref 6–20)
CALCIUM: 8.9 mg/dL (ref 8.9–10.3)
CALCIUM: 8.5 mg/dL — AB (ref 8.9–10.3)
CALCIUM: 8.7 mg/dL — AB (ref 8.9–10.3)
CHLORIDE: 97 mmol/L — AB (ref 101–111)
CHLORIDE: 95 mmol/L — AB (ref 101–111)
CHLORIDE: 92 mmol/L — AB (ref 101–111)
CO2: 23 mmol/L (ref 22–32)
CO2: 21 mmol/L — AB (ref 22–32)
CO2: 19 mmol/L — AB (ref 22–32)
CO2: 22 mmol/L (ref 22–32)
CO2: 20 mmol/L — AB (ref 22–32)
CREATININE: 4.64 mg/dL — AB (ref 0.61–1.24)
CREATININE: 4.94 mg/dL — AB (ref 0.61–1.24)
CREATININE: 5.32 mg/dL — AB (ref 0.61–1.24)
CREATININE: 5.6 mg/dL — AB (ref 0.61–1.24)
CREATININE: 6.05 mg/dL — AB (ref 0.61–1.24)
Calcium: 8.6 mg/dL — ABNORMAL LOW (ref 8.9–10.3)
Calcium: 8.1 mg/dL — ABNORMAL LOW (ref 8.9–10.3)
Chloride: 98 mmol/L — ABNORMAL LOW (ref 101–111)
Chloride: 98 mmol/L — ABNORMAL LOW (ref 101–111)
GFR calc Af Amer: 13 mL/min — ABNORMAL LOW (ref >60)
GFR calc Af Amer: 12 mL/min — ABNORMAL LOW (ref >60)
GFR calc Af Amer: 11 mL/min — ABNORMAL LOW (ref >60)
GFR calc non Af Amer: 11 mL/min — ABNORMAL LOW (ref >60)
GFR calc non Af Amer: 11 mL/min — ABNORMAL LOW (ref >60)
GFR calc non Af Amer: 10 mL/min — ABNORMAL LOW (ref >60)
GFR calc non Af Amer: 9 mL/min — ABNORMAL LOW (ref >60)
GFR calc non Af Amer: 8 mL/min — ABNORMAL LOW (ref >60)
GFR, EST AFRICAN AMERICAN: 10 mL/min — AB (ref >60)
GFR, EST AFRICAN AMERICAN: 10 mL/min — AB (ref >60)
GLUCOSE: 266 mg/dL — AB (ref 65–99)
GLUCOSE: 154 mg/dL — AB (ref 65–99)
GLUCOSE: 233 mg/dL — AB (ref 65–99)
Glucose, Bld: 242 mg/dL — ABNORMAL HIGH (ref 65–99)
Glucose, Bld: 251 mg/dL — ABNORMAL HIGH (ref 65–99)
POTASSIUM: 5.1 mmol/L (ref 3.5–5.1)
POTASSIUM: 5 mmol/L (ref 3.5–5.1)
Potassium: 4.7 mmol/L (ref 3.5–5.1)
Potassium: 4.7 mmol/L (ref 3.5–5.1)
Potassium: 4.9 mmol/L (ref 3.5–5.1)
Sodium: 132 mmol/L — ABNORMAL LOW (ref 135–145)
Sodium: 130 mmol/L — ABNORMAL LOW (ref 135–145)
Sodium: 131 mmol/L — ABNORMAL LOW (ref 135–145)
Sodium: 131 mmol/L — ABNORMAL LOW (ref 135–145)
Sodium: 127 mmol/L — ABNORMAL LOW (ref 135–145)

## 2015-06-18 LAB — RAPID URINE DRUG SCREEN, HOSP PERFORMED
AMPHETAMINES: NOT DETECTED
BENZODIAZEPINES: NOT DETECTED
Barbiturates: NOT DETECTED
COCAINE: NOT DETECTED
OPIATES: NOT DETECTED
TETRAHYDROCANNABINOL: NOT DETECTED

## 2015-06-18 LAB — CBC
HEMATOCRIT: 34.4 % — AB (ref 39.0–52.0)
Hemoglobin: 11.2 g/dL — ABNORMAL LOW (ref 13.0–17.0)
MCH: 27.8 pg (ref 26.0–34.0)
MCHC: 32.6 g/dL (ref 30.0–36.0)
MCV: 85.4 fL (ref 78.0–100.0)
Platelets: 208 10*3/uL (ref 150–400)
RBC: 4.03 MIL/uL — ABNORMAL LOW (ref 4.22–5.81)
RDW: 14 % (ref 11.5–15.5)
WBC: 12.9 10*3/uL — ABNORMAL HIGH (ref 4.0–10.5)

## 2015-06-18 LAB — MAGNESIUM: Magnesium: 2.2 mg/dL (ref 1.7–2.4)

## 2015-06-18 LAB — COMPREHENSIVE METABOLIC PANEL
ALBUMIN: 2.6 g/dL — AB (ref 3.5–5.0)
ALT: 28 U/L (ref 17–63)
AST: 68 U/L — AB (ref 15–41)
Alkaline Phosphatase: 91 U/L (ref 38–126)
Anion gap: 12 (ref 5–15)
BILIRUBIN TOTAL: 0.5 mg/dL (ref 0.3–1.2)
BUN: 66 mg/dL — AB (ref 6–20)
CHLORIDE: 98 mmol/L — AB (ref 101–111)
CO2: 23 mmol/L (ref 22–32)
Calcium: 8.9 mg/dL (ref 8.9–10.3)
Creatinine, Ser: 4.75 mg/dL — ABNORMAL HIGH (ref 0.61–1.24)
GFR calc Af Amer: 13 mL/min — ABNORMAL LOW (ref >60)
GFR calc non Af Amer: 11 mL/min — ABNORMAL LOW (ref >60)
GLUCOSE: 179 mg/dL — AB (ref 65–99)
POTASSIUM: 5 mmol/L (ref 3.5–5.1)
Sodium: 133 mmol/L — ABNORMAL LOW (ref 135–145)
Total Protein: 5.8 g/dL — ABNORMAL LOW (ref 6.5–8.1)

## 2015-06-18 LAB — GLUCOSE, CAPILLARY
GLUCOSE-CAPILLARY: 174 mg/dL — AB (ref 65–99)
GLUCOSE-CAPILLARY: 178 mg/dL — AB (ref 65–99)
GLUCOSE-CAPILLARY: 229 mg/dL — AB (ref 65–99)
GLUCOSE-CAPILLARY: 246 mg/dL — AB (ref 65–99)
GLUCOSE-CAPILLARY: 293 mg/dL — AB (ref 65–99)
GLUCOSE-CAPILLARY: 293 mg/dL — AB (ref 65–99)
GLUCOSE-CAPILLARY: 393 mg/dL — AB (ref 65–99)
Glucose-Capillary: 129 mg/dL — ABNORMAL HIGH (ref 65–99)
Glucose-Capillary: 126 mg/dL — ABNORMAL HIGH (ref 65–99)
Glucose-Capillary: 156 mg/dL — ABNORMAL HIGH (ref 65–99)
Glucose-Capillary: 215 mg/dL — ABNORMAL HIGH (ref 65–99)
Glucose-Capillary: 223 mg/dL — ABNORMAL HIGH (ref 65–99)

## 2015-06-18 LAB — TROPONIN I
TROPONIN I: 0.06 ng/mL — AB (ref <0.031)
Troponin I: 0.34 ng/mL — ABNORMAL HIGH (ref <0.031)
Troponin I: 0.06 ng/mL — ABNORMAL HIGH (ref <0.031)
Troponin I: 0.12 ng/mL — ABNORMAL HIGH (ref <0.031)

## 2015-06-18 LAB — SODIUM, URINE, RANDOM

## 2015-06-18 LAB — CK
CK TOTAL: 1983 U/L — AB (ref 49–397)
CK TOTAL: 1449 U/L — AB (ref 49–397)
Total CK: 2284 U/L — ABNORMAL HIGH (ref 49–397)

## 2015-06-18 LAB — PHOSPHORUS: PHOSPHORUS: 5.7 mg/dL — AB (ref 2.5–4.6)

## 2015-06-18 LAB — LACTIC ACID, PLASMA: Lactic Acid, Venous: 1.7 mmol/L (ref 0.5–2.0)

## 2015-06-18 LAB — PROTEIN / CREATININE RATIO, URINE
CREATININE, URINE: 185.57 mg/dL
Protein Creatinine Ratio: 1.67 mg/mg{Cre} — ABNORMAL HIGH (ref 0.00–0.15)
Total Protein, Urine: 310 mg/dL

## 2015-06-18 LAB — PREALBUMIN: Prealbumin: 10 mg/dL — ABNORMAL LOW (ref 18–38)

## 2015-06-18 LAB — CREATININE, URINE, RANDOM: Creatinine, Urine: 217.96 mg/dL

## 2015-06-18 LAB — TSH: TSH: 0.839 u[IU]/mL (ref 0.350–4.500)

## 2015-06-18 MED ORDER — STERILE WATER FOR INJECTION IV SOLN
INTRAVENOUS | Status: DC
  Administered 2015-06-18: 14:00:00 via INTRAVENOUS
  Filled 2015-06-18 (×3): qty 850

## 2015-06-18 MED ORDER — SODIUM CHLORIDE 0.45 % IV SOLN
INTRAVENOUS | Status: DC
  Administered 2015-06-18: 22:00:00 via INTRAVENOUS

## 2015-06-18 MED ORDER — INSULIN ASPART 100 UNIT/ML ~~LOC~~ SOLN
0.0000 [IU] | Freq: Three times a day (TID) | SUBCUTANEOUS | Status: DC
  Administered 2015-06-18: 3 [IU] via SUBCUTANEOUS
  Administered 2015-06-19 (×2): 7 [IU] via SUBCUTANEOUS

## 2015-06-18 MED ORDER — VANCOMYCIN HCL IN DEXTROSE 1-5 GM/200ML-% IV SOLN
1000.0000 mg | INTRAVENOUS | Status: DC
  Administered 2015-06-18: 1000 mg via INTRAVENOUS
  Filled 2015-06-18 (×2): qty 200

## 2015-06-18 MED ORDER — HEPARIN SODIUM (PORCINE) 5000 UNIT/ML IJ SOLN
5000.0000 [IU] | Freq: Three times a day (TID) | INTRAMUSCULAR | Status: DC
  Administered 2015-06-18: 5000 [IU] via SUBCUTANEOUS
  Filled 2015-06-18: qty 1

## 2015-06-18 MED ORDER — INSULIN ASPART 100 UNIT/ML ~~LOC~~ SOLN
0.0000 [IU] | Freq: Four times a day (QID) | SUBCUTANEOUS | Status: DC
  Administered 2015-06-18: 3 [IU] via SUBCUTANEOUS

## 2015-06-18 MED ORDER — GLUCERNA SHAKE PO LIQD
237.0000 mL | Freq: Three times a day (TID) | ORAL | Status: DC
  Administered 2015-06-18: 237 mL via ORAL

## 2015-06-18 MED ORDER — HEPARIN (PORCINE) IN NACL 100-0.45 UNIT/ML-% IJ SOLN
1200.0000 [IU]/h | INTRAMUSCULAR | Status: DC
  Administered 2015-06-18: 1200 [IU]/h via INTRAVENOUS
  Filled 2015-06-18: qty 250

## 2015-06-18 MED ORDER — INSULIN GLARGINE 100 UNIT/ML ~~LOC~~ SOLN
10.0000 [IU] | Freq: Once | SUBCUTANEOUS | Status: AC
Start: 2015-06-18 — End: 2015-06-18
  Administered 2015-06-18: 10 [IU] via SUBCUTANEOUS
  Filled 2015-06-18: qty 0.1

## 2015-06-18 MED ORDER — ASPIRIN EC 325 MG PO TBEC
325.0000 mg | DELAYED_RELEASE_TABLET | Freq: Every day | ORAL | Status: DC
  Administered 2015-06-18: 325 mg via ORAL
  Filled 2015-06-18 (×2): qty 1

## 2015-06-18 MED ORDER — METOCLOPRAMIDE HCL 5 MG/ML IJ SOLN
5.0000 mg | Freq: Four times a day (QID) | INTRAMUSCULAR | Status: DC
  Administered 2015-06-18 – 2015-06-19 (×6): 5 mg via INTRAVENOUS
  Filled 2015-06-18 (×5): qty 2

## 2015-06-18 MED ORDER — SODIUM CHLORIDE 0.9 % IV SOLN: INTRAVENOUS | Status: DC

## 2015-06-18 NOTE — Consult Note (Signed)
Reason for Consult: Abscess osteomyelitis gangrene left great toe and left second toe Referring Physician: Dr. Myrtie Cruise is an 74 y.o. male.  HPI: Patient is a 74 year old gentleman with type 2 diabetes not on dialysis who presents with acute history of black gangrenous changes of the left great toe and second toe.  Past Medical History  Diagnosis Date  . Diabetes mellitus without complication ([place])   . Hypertension   . DKA (diabetic ketoacidoses) ([place]) 06/18/2015  . AKI (acute kidney injury) ([place]) 06/18/2015  . Cancer ([place])     hx of prostate cancer    Past Surgical History  Procedure Laterality Date  . Prostate seeds      Family History  Problem Relation Age of Onset  . Diabetes Mother   . Diabetes Sister   . Diabetes Brother     Social History:  reports that he has never smoked. He has never used smokeless tobacco. He reports that he drinks alcohol. He reports that he does not use illicit drugs.  Allergies: No Known Allergies  Medications: I have reviewed the patient's current medications.  Results for orders placed or performed during the hospital encounter of 06/17/15 (from the past 48 hour(s))  Basic metabolic panel     Status: Abnormal   Collection Time: 06/17/15  5:08 PM  Result Value Ref Range   Sodium 129 (L) 135 - 145 mmol/L   Potassium 6.0 (H) 3.5 - 5.1 mmol/L   Chloride 95 (L) 101 - 111 mmol/L   CO2 18 (L) 22 - 32 mmol/L   Glucose, Bld 454 (H) 65 - 99 mg/dL   BUN 60 (H) 6 - 20 mg/dL   Creatinine, Ser 4.01 (H) 0.61 - 1.24 mg/dL   Calcium 8.8 (L) 8.9 - 10.3 mg/dL   GFR calc non Af Amer 14 (L) >60 mL/min   GFR calc Af Amer 16 (L) >60 mL/min    Comment: (NOTE) The eGFR has been calculated using the CKD EPI equation. This calculation has not been validated in all clinical situations. eGFR's persistently <60 mL/min signify possible Chronic Kidney Disease.    Anion gap 16 (H) 5 - 15  CBC     Status: Abnormal   Collection Time: 06/17/15  5:08 PM   Result Value Ref Range   WBC 16.2 (H) 4.0 - 10.5 K/uL   RBC 4.16 (L) 4.22 - 5.81 MIL/uL   Hemoglobin 11.4 (L) 13.0 - 17.0 g/dL   HCT 35.1 (L) 39.0 - 52.0 %   MCV 84.4 78.0 - 100.0 fL   MCH 27.4 26.0 - 34.0 pg   MCHC 32.5 30.0 - 36.0 g/dL   RDW 13.8 11.5 - 15.5 %   Platelets 208 150 - 400 K/uL  Troponin I     Status: Abnormal   Collection Time: 06/17/15  5:08 PM  Result Value Ref Range   Troponin I 0.05 (H) <0.031 ng/mL    Comment:        PERSISTENTLY INCREASED TROPONIN VALUES IN THE RANGE OF 0.04-0.49 ng/mL CAN BE SEEN IN:       -UNSTABLE ANGINA       -CONGESTIVE HEART FAILURE       -MYOCARDITIS       -CHEST TRAUMA       -ARRYHTHMIAS       -LATE PRESENTING MYOCARDIAL INFARCTION       -COPD   CLINICAL FOLLOW-UP RECOMMENDED.   Hepatic function panel     Status: Abnormal   Collection Time:  06/17/15  5:08 PM  Result Value Ref Range   Total Protein 6.6 6.5 - 8.1 g/dL   Albumin 2.9 (L) 3.5 - 5.0 g/dL   AST 73 (H) 15 - 41 U/L   ALT 29 17 - 63 U/L   Alkaline Phosphatase 91 38 - 126 U/L   Total Bilirubin 0.6 0.3 - 1.2 mg/dL   Bilirubin, Direct 0.3 0.1 - 0.5 mg/dL   Indirect Bilirubin 0.3 0.3 - 0.9 mg/dL  CK     Status: Abnormal   Collection Time: 06/17/15  5:08 PM  Result Value Ref Range   Total CK 3053 (H) 49 - 397 U/L  Sedimentation rate     Status: Abnormal   Collection Time: 06/17/15  5:08 PM  Result Value Ref Range   Sed Rate 53 (H) 0 - 16 mm/hr  CBG monitoring, ED     Status: Abnormal   Collection Time: 06/17/15  7:08 PM  Result Value Ref Range   Glucose-Capillary 316 (H) 65 - 99 mg/dL  Urinalysis, Routine w reflex microscopic (not at [facility])     Status: Abnormal   Collection Time: 06/17/15  9:20 PM  Result Value Ref Range   Color, Urine YELLOW YELLOW   APPearance CLOUDY (A) CLEAR   Specific Gravity, Urine 1.023 1.005 - 1.030   pH 5.0 5.0 - 8.0   Glucose, UA >1000 (A) NEGATIVE mg/dL   Hgb urine dipstick LARGE (A) NEGATIVE   Bilirubin Urine NEGATIVE NEGATIVE    Ketones, ur NEGATIVE NEGATIVE mg/dL   Protein, ur 100 (A) NEGATIVE mg/dL   Nitrite NEGATIVE NEGATIVE   Leukocytes, UA NEGATIVE NEGATIVE  Urine microscopic-add on     Status: Abnormal   Collection Time: 06/17/15  9:20 PM  Result Value Ref Range   Squamous Epithelial / LPF 0-5 (A) NONE SEEN   WBC, UA 0-5 0 - 5 WBC/hpf   RBC / HPF 6-30 0 - 5 RBC/hpf   Bacteria, UA FEW (A) NONE SEEN   Casts HYALINE CASTS (A) NEGATIVE  Glucose, capillary     Status: Abnormal   Collection Time: 06/17/15 10:13 PM  Result Value Ref Range   Glucose-Capillary 281 (H) 65 - 99 mg/dL  Magnesium     Status: None   Collection Time: 06/17/15 10:35 PM  Result Value Ref Range   Magnesium 2.2 1.7 - 2.4 mg/dL  Phosphorus     Status: Abnormal   Collection Time: 06/17/15 10:35 PM  Result Value Ref Range   Phosphorus 6.0 (H) 2.5 - 4.6 mg/dL  Troponin I (q 6hr x 3)     Status: Abnormal   Collection Time: 06/17/15 10:35 PM  Result Value Ref Range   Troponin I 0.19 (H) <0.031 ng/mL    Comment:        PERSISTENTLY INCREASED TROPONIN VALUES IN THE RANGE OF 0.04-0.49 ng/mL CAN BE SEEN IN:       -UNSTABLE ANGINA       -CONGESTIVE HEART FAILURE       -MYOCARDITIS       -CHEST TRAUMA       -ARRYHTHMIAS       -LATE PRESENTING MYOCARDIAL INFARCTION       -COPD   CLINICAL FOLLOW-UP RECOMMENDED.   Procalcitonin - Baseline     Status: None   Collection Time: 06/17/15 10:35 PM  Result Value Ref Range   Procalcitonin 35.32 ng/mL    Comment:        Interpretation: PCT >= 10 ng/mL: Important systemic inflammatory  response, almost exclusively due to severe bacterial sepsis or septic shock. (NOTE)         ICU PCT Algorithm               Non ICU PCT Algorithm    ----------------------------     ------------------------------         PCT < 0.25 ng/mL                 PCT < 0.1 ng/mL     Stopping of antibiotics            Stopping of antibiotics       strongly encouraged.               strongly encouraged.     ----------------------------     ------------------------------       PCT level decrease by               PCT < 0.25 ng/mL       >= 80% from peak PCT       OR PCT 0.25 - 0.5 ng/mL          Stopping of antibiotics                                             encouraged.     Stopping of antibiotics           encouraged.    ----------------------------     ------------------------------       PCT level decrease by              PCT >= 0.25 ng/mL       < 80% from peak PCT        AND PCT >= 0.5 ng/mL             Continuing antibiotics                                              encouraged.       Continuing antibiotics            encouraged.    ----------------------------     ------------------------------     PCT level increase compared          PCT > 0.5 ng/mL         with peak PCT AND          PCT >= 0.5 ng/mL             Escalation of antibiotics                                          strongly encouraged.      Escalation of antibiotics        strongly encouraged.   CBC     Status: Abnormal   Collection Time: 06/17/15 10:35 PM  Result Value Ref Range   WBC 16.1 (H) 4.0 - 10.5 K/uL   RBC 4.33 4.22 - 5.81 MIL/uL   Hemoglobin 12.1 (L) 13.0 - 17.0 g/dL   HCT 37.0 (L) 39.0 - 52.0 %   MCV 85.5 78.0 - 100.0 fL   MCH 27.9  26.0 - 34.0 pg   MCHC 32.7 30.0 - 36.0 g/dL   RDW 13.9 11.5 - 15.5 %   Platelets 196 150 - 400 K/uL  Basic metabolic panel     Status: Abnormal   Collection Time: 06/17/15 10:35 PM  Result Value Ref Range   Sodium 133 (L) 135 - 145 mmol/L   Potassium 6.3 (HH) 3.5 - 5.1 mmol/L    Comment: CRITICAL RESULT CALLED TO, READ BACK BY AND VERIFIED WITH: QIU,R RN 06/17/2015 [address] NO VISIBLE HEMOLYSIS    Chloride 95 (L) 101 - 111 mmol/L   CO2 23 22 - 32 mmol/L   Glucose, Bld 332 (H) 65 - 99 mg/dL   BUN 64 (H) 6 - 20 mg/dL   Creatinine, Ser 4.18 (H) 0.61 - 1.24 mg/dL   Calcium 9.1 8.9 - 10.3 mg/dL   GFR calc non Af Amer 13 (L) >60 mL/min   GFR calc Af Amer 15  (L) >60 mL/min    Comment: (NOTE) The eGFR has been calculated using the CKD EPI equation. This calculation has not been validated in all clinical situations. eGFR's persistently <60 mL/min signify possible Chronic Kidney Disease.    Anion gap 15 5 - 15  CK     Status: Abnormal   Collection Time: 06/17/15 10:35 PM  Result Value Ref Range   Total CK 2960 (H) 49 - 397 U/L  Glucose, capillary     Status: Abnormal   Collection Time: 06/17/15 11:47 PM  Result Value Ref Range   Glucose-Capillary 293 (H) 65 - 99 mg/dL  Lactic acid, plasma     Status: None   Collection Time: 06/18/15 12:01 AM  Result Value Ref Range   Lactic Acid, Venous 1.7 0.5 - 2.0 mmol/L  Glucose, capillary     Status: Abnormal   Collection Time: 06/18/15 12:37 AM  Result Value Ref Range   Glucose-Capillary 393 (H) 65 - 99 mg/dL  Creatinine, urine, random     Status: None   Collection Time: 06/18/15  1:16 AM  Result Value Ref Range   Creatinine, Urine 217.96 mg/dL  Sodium, urine, random     Status: None   Collection Time: 06/18/15  1:16 AM  Result Value Ref Range   Sodium, Ur <10 mmol/L  Urine rapid drug screen (hosp performed)     Status: None   Collection Time: 06/18/15  1:17 AM  Result Value Ref Range   Opiates NONE DETECTED NONE DETECTED   Cocaine NONE DETECTED NONE DETECTED   Benzodiazepines NONE DETECTED NONE DETECTED   Amphetamines NONE DETECTED NONE DETECTED   Tetrahydrocannabinol NONE DETECTED NONE DETECTED   Barbiturates NONE DETECTED NONE DETECTED    Comment:        DRUG SCREEN FOR MEDICAL PURPOSES ONLY.  IF CONFIRMATION IS NEEDED FOR ANY PURPOSE, NOTIFY LAB WITHIN 5 DAYS.        LOWEST DETECTABLE LIMITS FOR URINE DRUG SCREEN Drug Class       Cutoff (ng/mL) Amphetamine      1000 Barbiturate      200 Benzodiazepine   623 Tricyclics       762 Opiates          300 Cocaine          300 THC              50   Glucose, capillary     Status: Abnormal   Collection Time: 06/18/15  1:46 AM   Result Value Ref Range  Glucose-Capillary 293 (H) 65 - 99 mg/dL  TSH     Status: None   Collection Time: 06/18/15  2:30 AM  Result Value Ref Range   TSH 0.839 0.350 - 4.500 uIU/mL  Basic metabolic panel     Status: Abnormal   Collection Time: 06/18/15  2:30 AM  Result Value Ref Range   Sodium 132 (L) 135 - 145 mmol/L   Potassium 4.7 3.5 - 5.1 mmol/L    Comment: DELTA CHECK NOTED NO VISIBLE HEMOLYSIS    Chloride 98 (L) 101 - 111 mmol/L   CO2 23 22 - 32 mmol/L   Glucose, Bld 266 (H) 65 - 99 mg/dL   BUN 65 (H) 6 - 20 mg/dL   Creatinine, Ser 4.64 (H) 0.61 - 1.24 mg/dL   Calcium 8.9 8.9 - 10.3 mg/dL   GFR calc non Af Amer 11 (L) >60 mL/min   GFR calc Af Amer 13 (L) >60 mL/min    Comment: (NOTE) The eGFR has been calculated using the CKD EPI equation. This calculation has not been validated in all clinical situations. eGFR's persistently <60 mL/min signify possible Chronic Kidney Disease.    Anion gap 11 5 - 15  Glucose, capillary     Status: Abnormal   Collection Time: 06/18/15  2:43 AM  Result Value Ref Range   Glucose-Capillary 229 (H) 65 - 99 mg/dL  Glucose, capillary     Status: Abnormal   Collection Time: 06/18/15  3:44 AM  Result Value Ref Range   Glucose-Capillary 174 (H) 65 - 99 mg/dL  CK     Status: Abnormal   Collection Time: 06/18/15  4:08 AM  Result Value Ref Range   Total CK 2284 (H) 49 - 397 U/L  Troponin I (q 6hr x 3)     Status: Abnormal   Collection Time: 06/18/15  4:08 AM  Result Value Ref Range   Troponin I 0.34 (H) <0.031 ng/mL    Comment:        PERSISTENTLY INCREASED TROPONIN VALUES IN THE RANGE OF 0.04-0.49 ng/mL CAN BE SEEN IN:       -UNSTABLE ANGINA       -CONGESTIVE HEART FAILURE       -MYOCARDITIS       -CHEST TRAUMA       -ARRYHTHMIAS       -LATE PRESENTING MYOCARDIAL INFARCTION       -COPD   CLINICAL FOLLOW-UP RECOMMENDED.   Prealbumin     Status: Abnormal   Collection Time: 06/18/15  4:08 AM  Result Value Ref Range   Prealbumin  10.0 (L) 18 - 38 mg/dL  Magnesium     Status: None   Collection Time: 06/18/15  4:08 AM  Result Value Ref Range   Magnesium 2.2 1.7 - 2.4 mg/dL  Phosphorus     Status: Abnormal   Collection Time: 06/18/15  4:08 AM  Result Value Ref Range   Phosphorus 5.7 (H) 2.5 - 4.6 mg/dL  Comprehensive metabolic panel     Status: Abnormal   Collection Time: 06/18/15  4:08 AM  Result Value Ref Range   Sodium 133 (L) 135 - 145 mmol/L   Potassium 5.0 3.5 - 5.1 mmol/L   Chloride 98 (L) 101 - 111 mmol/L   CO2 23 22 - 32 mmol/L   Glucose, Bld 179 (H) 65 - 99 mg/dL   BUN 66 (H) 6 - 20 mg/dL   Creatinine, Ser 4.75 (H) 0.61 - 1.24 mg/dL   Calcium 8.9 8.9 -  10.3 mg/dL   Total Protein 5.8 (L) 6.5 - 8.1 g/dL   Albumin 2.6 (L) 3.5 - 5.0 g/dL   AST 68 (H) 15 - 41 U/L   ALT 28 17 - 63 U/L   Alkaline Phosphatase 91 38 - 126 U/L   Total Bilirubin 0.5 0.3 - 1.2 mg/dL   GFR calc non Af Amer 11 (L) >60 mL/min   GFR calc Af Amer 13 (L) >60 mL/min    Comment: (NOTE) The eGFR has been calculated using the CKD EPI equation. This calculation has not been validated in all clinical situations. eGFR's persistently <60 mL/min signify possible Chronic Kidney Disease.    Anion gap 12 5 - 15  CBC     Status: Abnormal   Collection Time: 06/18/15  4:08 AM  Result Value Ref Range   WBC 12.9 (H) 4.0 - 10.5 K/uL   RBC 4.03 (L) 4.22 - 5.81 MIL/uL   Hemoglobin 11.2 (L) 13.0 - 17.0 g/dL   HCT 34.4 (L) 39.0 - 52.0 %   MCV 85.4 78.0 - 100.0 fL   MCH 27.8 26.0 - 34.0 pg   MCHC 32.6 30.0 - 36.0 g/dL   RDW 14.0 11.5 - 15.5 %   Platelets 208 150 - 400 K/uL  Glucose, capillary     Status: Abnormal   Collection Time: 06/18/15  4:49 AM  Result Value Ref Range   Glucose-Capillary 129 (H) 65 - 99 mg/dL  Glucose, capillary     Status: Abnormal   Collection Time: 06/18/15  5:47 AM  Result Value Ref Range   Glucose-Capillary 126 (H) 65 - 99 mg/dL  Basic metabolic panel     Status: Abnormal   Collection Time: 06/18/15  6:35 AM   Result Value Ref Range   Sodium 130 (L) 135 - 145 mmol/L   Potassium 4.7 3.5 - 5.1 mmol/L   Chloride 98 (L) 101 - 111 mmol/L   CO2 21 (L) 22 - 32 mmol/L   Glucose, Bld 154 (H) 65 - 99 mg/dL   BUN 67 (H) 6 - 20 mg/dL   Creatinine, Ser 4.94 (H) 0.61 - 1.24 mg/dL   Calcium 8.5 (L) 8.9 - 10.3 mg/dL   GFR calc non Af Amer 11 (L) >60 mL/min   GFR calc Af Amer 12 (L) >60 mL/min    Comment: (NOTE) The eGFR has been calculated using the CKD EPI equation. This calculation has not been validated in all clinical situations. eGFR's persistently <60 mL/min signify possible Chronic Kidney Disease.    Anion gap 11 5 - 15  Glucose, capillary     Status: Abnormal   Collection Time: 06/18/15  6:53 AM  Result Value Ref Range   Glucose-Capillary 156 (H) 65 - 99 mg/dL  Troponin I (q 6hr x 3)     Status: Abnormal   Collection Time: 06/18/15  7:56 AM  Result Value Ref Range   Troponin I 0.06 (H) <0.031 ng/mL    Comment:        PERSISTENTLY INCREASED TROPONIN VALUES IN THE RANGE OF 0.04-0.49 ng/mL CAN BE SEEN IN:       -UNSTABLE ANGINA       -CONGESTIVE HEART FAILURE       -MYOCARDITIS       -CHEST TRAUMA       -ARRYHTHMIAS       -LATE PRESENTING MYOCARDIAL INFARCTION       -COPD   CLINICAL FOLLOW-UP RECOMMENDED.   Glucose, capillary     Status:  Abnormal   Collection Time: 06/18/15  7:57 AM  Result Value Ref Range   Glucose-Capillary 178 (H) 65 - 99 mg/dL  Glucose, capillary     Status: Abnormal   Collection Time: 06/18/15 10:57 AM  Result Value Ref Range   Glucose-Capillary 215 (H) 65 - 99 mg/dL  Basic metabolic panel     Status: Abnormal   Collection Time: 06/18/15 11:00 AM  Result Value Ref Range   Sodium 131 (L) 135 - 145 mmol/L   Potassium 4.9 3.5 - 5.1 mmol/L   Chloride 97 (L) 101 - 111 mmol/L   CO2 19 (L) 22 - 32 mmol/L   Glucose, Bld 233 (H) 65 - 99 mg/dL   BUN 70 (H) 6 - 20 mg/dL   Creatinine, Ser 5.32 (H) 0.61 - 1.24 mg/dL   Calcium 8.7 (L) 8.9 - 10.3 mg/dL   GFR calc non  Af Amer 10 (L) >60 mL/min   GFR calc Af Amer 11 (L) >60 mL/min    Comment: (NOTE) The eGFR has been calculated using the CKD EPI equation. This calculation has not been validated in all clinical situations. eGFR's persistently <60 mL/min signify possible Chronic Kidney Disease.    Anion gap 15 5 - 15  Basic metabolic panel     Status: Abnormal   Collection Time: 06/18/15 12:58 PM  Result Value Ref Range   Sodium 131 (L) 135 - 145 mmol/L   Potassium 5.1 3.5 - 5.1 mmol/L   Chloride 95 (L) 101 - 111 mmol/L   CO2 22 22 - 32 mmol/L   Glucose, Bld 242 (H) 65 - 99 mg/dL   BUN 72 (H) 6 - 20 mg/dL   Creatinine, Ser 5.60 (H) 0.61 - 1.24 mg/dL   Calcium 8.6 (L) 8.9 - 10.3 mg/dL   GFR calc non Af Amer 9 (L) >60 mL/min   GFR calc Af Amer 10 (L) >60 mL/min    Comment: (NOTE) The eGFR has been calculated using the CKD EPI equation. This calculation has not been validated in all clinical situations. eGFR's persistently <60 mL/min signify possible Chronic Kidney Disease.    Anion gap 14 5 - 15  CK     Status: Abnormal   Collection Time: 06/18/15 12:58 PM  Result Value Ref Range   Total CK 1983 (H) 49 - 397 U/L  Troponin I (q 6hr x 3)     Status: Abnormal   Collection Time: 06/18/15 12:58 PM  Result Value Ref Range   Troponin I 0.12 (H) <0.031 ng/mL    Comment:        PERSISTENTLY INCREASED TROPONIN VALUES IN THE RANGE OF 0.04-0.49 ng/mL CAN BE SEEN IN:       -UNSTABLE ANGINA       -CONGESTIVE HEART FAILURE       -MYOCARDITIS       -CHEST TRAUMA       -ARRYHTHMIAS       -LATE PRESENTING MYOCARDIAL INFARCTION       -COPD   CLINICAL FOLLOW-UP RECOMMENDED.   Protein / creatinine ratio, urine     Status: Abnormal   Collection Time: 06/18/15  4:10 PM  Result Value Ref Range   Creatinine, Urine 185.57 mg/dL   Total Protein, Urine 310 mg/dL    Comment: RESULTS CONFIRMED BY MANUAL DILUTION NO NORMAL RANGE ESTABLISHED FOR THIS TEST    Protein Creatinine Ratio 1.67 (H) 0.00 - 0.15  mg/mg[Cre]  Glucose, capillary     Status: Abnormal   Collection  Time: 06/18/15  5:11 PM  Result Value Ref Range   Glucose-Capillary 223 (H) 65 - 99 mg/dL    Dg Chest 2 View  06/17/2015  CLINICAL DATA:  Weakness. EXAM: CHEST  2 VIEW COMPARISON:  None. FINDINGS: Trachea is midline. Heart size within normal limits. Lungs are clear. No pleural fluid. Degenerative changes are seen in the spine. IMPRESSION: No acute findings. Electronically Signed   By: Lorin Picket M.D.   On: 06/17/2015 18:10   Dg Abd 1 View  06/17/2015  CLINICAL DATA:  Abdominal distention EXAM: ABDOMEN - 1 VIEW COMPARISON:  None. FINDINGS: Brachytherapy seeds overlie the prostate. There mildly dilated small bowel loops in the central abdomen measuring up to 3.8 cm in diameter. Mild stool is seen throughout the colon. No evidence of pneumatosis or pneumoperitoneum. No pathologic soft tissue calcifications. IMPRESSION: Mildly dilated small bowel loops in the central abdomen, for which the differential includes a mid to distal small bowel obstruction or adynamic ileus. Electronically Signed   By: Ilona Sorrel M.D.   On: 06/17/2015 21:12   US Renal  06/18/2015  CLINICAL DATA:  Acute onset of renal failure.  Initial encounter. EXAM: RENAL / URINARY TRACT ULTRASOUND COMPLETE COMPARISON:  None. FINDINGS: Right Kidney: Length: 10.2 cm. Increased parenchymal echogenicity noted. A small 1.1 cm cyst is noted at the interpole region of the right kidney. No hydronephrosis visualized. Left Kidney: Length: 11.9 cm. Mildly increased parenchymal echogenicity noted. No mass or hydronephrosis visualized. Bladder: Decompressed and not well assessed. IMPRESSION: 1. No evidence of hydronephrosis. 2. Increased renal parenchymal echogenicity likely reflects medical renal disease. 3. Small right renal cyst noted. Electronically Signed   By: Garald Balding M.D.   On: 06/18/2015 00:41   Dg Shoulder Right Port  06/18/2015  CLINICAL DATA:  Patient with  diffuse right shoulder pain. No known injury. EXAM: PORTABLE RIGHT SHOULDER - 2+ VIEW COMPARISON:  Chest radiograph 06/17/2015 FINDINGS: Limited evaluation due to inability to position patient. No evidence for displaced fracture. AC joint degenerative changes. Visualized right hemi thorax is unremarkable. IMPRESSION: Limited exam due to difficulty with patient positioning. Within the above limitation, no evidence for displaced fracture. Recommend evaluation with standard radiographic views when patient clinically able. Electronically Signed   By: Lovey Newcomer M.D.   On: 06/18/2015 13:28   Dg Foot Complete Left  06/17/2015  CLINICAL DATA:  Unable to walk.  Foot pain.  Diabetes. EXAM: LEFT FOOT - COMPLETE 3+ VIEW COMPARISON:  None. FINDINGS: Questionable soft tissue ulceration at the tips of the first and second toes. 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 No visible radiographic changes of acute osteomyelitis. No fracture, subluxation or dislocation. IMPRESSION: No radiographic changes of osteomyelitis. Electronically Signed   By: Rolm Baptise M.D.   On: 06/17/2015 18:11    Review of Systems  All other systems reviewed and are negative.  Blood pressure 95/64, pulse 102, temperature 99.3 F (37.4 C), temperature source Oral, resp. rate 20, height _0  (1.88 m), weight 97.977 kg (216 lb), SpO2 92 %. Physical Exam On examination patient has no ulcers in his leg he has a good dorsalis pedis pulse he has black gangrenous changes of the great toe and second toe with exposed bone of the great toe and second toe. The ulceration extends to the webspace. Radiographs do not show destructive bony changes however patient does have exposed bone of the great toe and second toe. Assessment/Plan: Assessment: Osteomyelitis ulceration and gangrene  left foot second and great toe with diabetic insensate neuropathy with good pulses.  Plan: We'll try 4 of foot salvage with amputation of the  first and second Ray. Risk and benefits were discussed including risk of the wound not healing. Risk of a higher level amputation. Patient states he understands wish to proceed at this time plan for surgery Friday afternoon.  David Ford,David Ford 06/18/2015, 6:13 PM

## 2015-06-18 NOTE — Progress Notes (Signed)
ANTICOAGULATION CONSULT NOTE - Initial Consult  Pharmacy Consult for heparin Indication: chest pain/ACS  No Known Allergies  Patient Measurements: Height: 6\' 2"  (188 cm) Weight: 216 lb (97.977 kg) IBW/kg (Calculated) : 82.2  Vital Signs: Temp: 97.7 F (36.5 C) (12/29 0805) Temp Source: Oral (12/29 0805) BP: 115/54 mmHg (12/29 0805) Pulse Rate: 82 (12/29 0805)  Labs:  Recent Labs  06/17/15 1708 06/17/15 2235 06/18/15 0230 06/18/15 0408 06/18/15 0635 06/18/15 0756  HGB 11.4* 12.1*  --  11.2*  --   --   HCT 35.1* 37.0*  --  34.4*  --   --   PLT 208 196  --  208  --   --   CREATININE 4.01* 4.18* 4.64* 4.75* 4.94*  --   CKTOTAL 3053* 2960*  --  2284*  --   --   TROPONINI 0.05* 0.19*  --  0.34*  --  0.06*    Assessment: 74 yo male admitted 12/28 after being unable to get up from the floor after 3 days. Initial EKG concerning for possible anteroseptal infarct and L bundle branch block. Troponin 0.34. Pharmacy consulted to transition from heparin for DVT prophylaxis to treatment dose. Cardiology eval px.   Goal of Therapy:  Heparin level 0.3-0.7 units/ml Monitor platelets by anticoagulation protocol: Yes   Plan:  1. Omit heparin bolus since reeved 5000 units earlier this am; start heparin gtt at 1200 units/hr 2. HL in 8 hours and daily thereafter  3. Monitor CBC, sxsx of bleeding and other pertinent albs/test   Vincenza Hews, PharmD, BCPS 06/18/2015, 9:40 AM Pager: [phone]

## 2015-06-18 NOTE — Consult Note (Addendum)
WOC wound consult note Reason for Consult: Consult requested for left foot wounds.  Pt is unable to state how long wounds have been present. Wound type: Left great toe with full thickness wound affecting the entire toe; 4X3cm, outer loose peeling skin removes easily, revealing brown necrotic wound bed, fluctuant with mod amt tan drainage and strong foul odor.   Left plantar foot next to this with fluctuant white macerated area; 1X.3cm with intact skin Left 2nd toe with full thickness wound affecting the entire toe; 3X2cm, outer loose peeling skin removes easily, revealing brown necrotic wound bed, fluctuant with mod amt tan drainage and strong foul odor.   Dressing procedure/placement/frequency: Recommend ortho consult for further plan of care related to questionable viability of the 1st and 2nd toe.  Discussed plan of care with primary team. Moist gauze applied until further input is available from the ortho service. Please re-consult if further assistance is needed.  Thank-you,  Julien Girt MSN, [place], [place], [place], [place]

## 2015-06-18 NOTE — Progress Notes (Signed)
Echocardiogram 2D Echocardiogram has been performed.  Joelene Millin 06/18/2015, 10:33 AM

## 2015-06-18 NOTE — Evaluation (Signed)
Physical Therapy Evaluation Patient Details Name: David Ford MRN: [id number] DOB: 09-29-1940 Today's Date: 06/18/2015   History of Present Illness  74 yo M with hx of DM, HTN Here with generalized weakness was found to have ARF most likely due to dehydration in the setting of ACEi and was found to have untreated diabetic foot ulcer  Clinical Impression  Pt admitted with the above complications. Pt currently with functional limitations due to the deficits listed below (see PT Problem List). Demonstrates intermittent balance loss requiring physical assist to prevent falls. Impaired ability to follow commands consistently. Bowel incontinence. Decreased awareness of deficits and safety. SpO2 92-93% on room air during therapy evaluation. Supportive daughter however lives 30 minutes away and will be of limited help for pt at home.Pt will benefit from skilled PT to increase their independence and safety with mobility to allow discharge to the venue listed below.       Follow Up Recommendations SNF;Supervision/Assistance - 24 hour    Equipment Recommendations  Rolling walker with 5" wheels    Recommendations for Other Services       Precautions / Restrictions Precautions Precautions: Fall      Mobility  Bed Mobility Overal bed mobility: Needs Assistance Bed Mobility: Supine to Sit;Sit to Supine     Supine to sit: Min assist Sit to supine: Min assist   General bed mobility comments: Min assist for LE support and Truncal suppor to rise out of bed. LE support only to enter bed. Use of rail as needed with cues for technique.  Transfers Overall transfer level: Needs assistance Equipment used: Rolling walker (2 wheeled) Transfers: Sit to/from Stand Sit to Stand: Min assist         General transfer comment: min assist for boost to stand from lowest bed setting. Cues to rock for momentum and hand placement. Some posteror loss of balance upon standing requiring min assist to  correct.  Ambulation/Gait Ambulation/Gait assistance: Min assist Ambulation Distance (Feet): 55 Feet Assistive device: Rolling walker (2 wheeled) Gait Pattern/deviations: Step-through pattern;Decreased stride length;Drifts right/left;Staggering left Gait velocity: slow Gait velocity interpretation: <1.8 ft/sec, indicative of risk for recurrent falls General Gait Details: Min assist frequently for walker control due to right drift, with delayed and inconsistent ability to correct with verbal cues, loss of balance towards left needing support to correct. Educated on safe DME use with rolling walker.  Stairs            Wheelchair Mobility    Modified Rankin (Stroke Patients Only)       Balance Overall balance assessment: Needs assistance;History of Falls Sitting-balance support: No upper extremity supported;Feet supported Sitting balance-Leahy Scale: Good     Standing balance support: No upper extremity supported Standing balance-Leahy Scale: Fair Standing balance comment: Stood for several minutes for pericare due to bowel incontinence with intermittent loss of balance to posterior.                             Pertinent Vitals/Pain Pain Assessment: 0-10 Pain Score: 10-Worst pain ever Pain Location: BIL shoulders and Hips Pain Descriptors / Indicators: Aching Pain Intervention(s): Monitored during session;Repositioned    Home Living Family/patient expects to be discharged to:: Private residence Living Arrangements: Alone Available Help at Discharge: Family;Neighbor;Available PRN/intermittently Type of Home: House Home Access: Stairs to enter Entrance Stairs-Rails: None Entrance Stairs-Number of Steps: 2-3 Home Layout: Two level;Able to live on main level with bedroom/bathroom Home Equipment: None  Prior Function Level of Independence: Independent               Hand Dominance        Extremity/Trunk Assessment   Upper Extremity  Assessment: Defer to OT evaluation           Lower Extremity Assessment: Generalized weakness         Communication   Communication: No difficulties  Cognition Arousal/Alertness: Awake/alert Behavior During Therapy: Flat affect Overall Cognitive Status: Impaired/Different from baseline Area of Impairment: Orientation;Following commands;Problem solving;Safety/judgement Orientation Level: Disoriented to;Time     Following Commands: Follows one step commands inconsistently;Follows one step commands with increased time Safety/Judgement: Decreased awareness of safety;Decreased awareness of deficits (unaware of instability and balance loss)   Problem Solving: Slow processing;Decreased initiation;Difficulty sequencing;Requires verbal cues;Requires tactile cues      General Comments General comments (skin integrity, edema, etc.): SpO2 93% on room air at rest, HR 80. Ambulating HR 90 with SpO2 92% no dyspnea, on room air. bowel incontinence. Pt daughter present very supportive, states pt cognition altered dramatically from baseline.    Exercises        Assessment/Plan    PT Assessment Patient needs continued PT services  PT Diagnosis Difficulty walking;Abnormality of gait;Generalized weakness;Altered mental status   PT Problem List Decreased strength;Decreased range of motion;Decreased activity tolerance;Decreased balance;Decreased mobility;Decreased cognition;Decreased knowledge of use of DME;Decreased safety awareness;Decreased knowledge of precautions;Impaired sensation;Pain  PT Treatment Interventions DME instruction;Gait training;Functional mobility training;Therapeutic activities;Therapeutic exercise;Balance training;Patient/family education;Cognitive remediation   PT Goals (Current goals can be found in the Care Plan section) Acute Rehab PT Goals Patient Stated Goal: None stated PT Goal Formulation: With patient/family Time For Goal Achievement: 07/02/15 Potential to  Achieve Goals: Good    Frequency Min 3X/week   Barriers to discharge Decreased caregiver support Has limited supervision available    Co-evaluation               End of Session Equipment Utilized During Treatment: Gait belt Activity Tolerance: Patient tolerated treatment well Patient left: in bed;with call bell/phone within reach;with family/visitor present;with bed alarm set Nurse Communication: Mobility status         Time: 1021 (-8 minutes while MD in room speaking with pt.)-1051 PT Time Calculation (min) (ACUTE ONLY): 30 min   Charges:   PT Evaluation $Initial PT Evaluation Tier I: 1 Procedure     PT G CodesEllouise Newer 06/18/2015, 11:47 AM  Elayne Snare, [place]

## 2015-06-18 NOTE — Consult Note (Signed)
Fahad Lader Admit Date: 06/17/2015 06/18/2015 Rexene Agent Requesting Physician:  Short MD  Reason for Consult:  AKI, Hyerkalemia  HPI:  89F seen at request of Dr. Sheran Fava for the above issues. Patient's past medical history is pertinent for long-standing diabetes without knowledge of microvascular disease, long-standing hypertension. He does take an ACE inhibitor each day at not a diuretic. In November of this year he had a serum creatinine of 2.02.    The patient was admitted after presenting to the emergency room having been on the floor for 3 days after a fall. He was found to have renal insufficiency, mild hyperkalemia, mild metabolic acidosis. His creatinine kinase was elevated.  Renal ultrasound during admission demonstrated normal-sized kidneys with increased echogenicity and no evidence of obstruction or hydronephrosis.the daughter and a friend in the room cannot identify any nonsteroidal use.urine analysis demonstrated 2+ proteinuria and hematuria (he does have a Foley catheter) and a urine sodium is less than 10.  He has been found to have a diabetic foot ulcer with orthopedic evaluation pending.  He lives alone. His daughter, Amedeo Gory, who is in the room with them lives in [place]. His son, who lives in [place], manages much of his finances. He is confused upon exam but answers simple questions correctly.   CREATININE, SER (mg/dL)  Date Value  06/18/2015 5.32*  06/18/2015 4.94*  06/18/2015 4.75*  06/18/2015 4.64*  06/17/2015 4.18*  06/17/2015 4.01*  ] I/Os: I/O last 3 completed shifts: In: 76 [P.O.:480; I.V.:500] Out: 400 [Urine:400]   ROS NSAIDS: no known exposure IV Contrast no exposure TMP/SMX no known expsoure Hypotension no identified period Balance of 12 systems is negative w/ exceptions as above  PMH  Past Medical History  Diagnosis Date  . Diabetes mellitus without complication ([place])   . Hypertension   . DKA (diabetic ketoacidoses) ([place])  06/18/2015  . AKI (acute kidney injury) ([place]) 06/18/2015  . Cancer ([place])     hx of prostate cancer   PSH  Past Surgical History  Procedure Laterality Date  . Prostate seeds     FH  Family History  Problem Relation Age of Onset  . Diabetes Mother   . Diabetes Sister   . Diabetes Brother    SH  reports that he has never smoked. He has never used smokeless tobacco. He reports that he drinks alcohol. He reports that he does not use illicit drugs. Allergies No Known Allergies Home medications Prior to Admission medications   Medication Sig Start Date End Date Taking? Authorizing Provider  AMLODIPINE BESYLATE PO Take by mouth.   Yes Historical Provider, MD  CARVEDILOL PO Take by mouth.   Yes Historical Provider, MD  GABAPENTIN PO Take by mouth.   Yes Historical Provider, MD  HYDRALAZINE HCL PO Take by mouth.   Yes Historical Provider, MD  Linagliptin (TRADJENTA PO) Take by mouth.   Yes Historical Provider, MD  LISINOPRIL PO Take by mouth.   Yes Historical Provider, MD  Rosuvastatin Calcium (CRESTOR PO) Take by mouth.   Yes Historical Provider, MD  TRAZODONE HCL PO Take by mouth.   Yes Historical Provider, MD    Current Medications Scheduled Meds: . aspirin EC  325 mg Oral Daily  . docusate sodium  100 mg Oral BID  . folic acid  1 mg Oral Daily  . insulin aspart  0-9 Units Subcutaneous 4 times per day  . metoCLOPramide (REGLAN) injection  5 mg Intravenous 4 times per day  . piperacillin-tazobactam (ZOSYN)  IV  2.25 g Intravenous Q8H  . senna  1 tablet Oral BID  . sodium chloride  3 mL Intravenous Q12H  . thiamine  100 mg Oral Daily  . vancomycin  1,000 mg Intravenous Q48H   Continuous Infusions: . heparin 1,200 Units/hr (06/18/15 1102)   PRN Meds:.acetaminophen **OR** acetaminophen, dextrose, HYDROcodone-acetaminophen, ondansetron **OR** ondansetron (ZOFRAN) IV, polyethylene glycol  CBC  Recent Labs Lab 06/17/15 1708 06/17/15 2235 06/18/15 0408  WBC 16.2* 16.1* 12.9*   HGB 11.4* 12.1* 11.2*  HCT 35.1* 37.0* 34.4*  MCV 84.4 85.5 85.4  PLT 208 196 [id number]   Basic Metabolic Panel  Recent Labs Lab 06/17/15 1708 06/17/15 2235 06/18/15 0230 06/18/15 0408 06/18/15 0635 06/18/15 1100  NA 129* 133* 132* 133* 130* 131*  K 6.0* 6.3* 4.7 5.0 4.7 4.9  CL 95* 95* 98* 98* 98* 97*  CO2 18* 23 23 23  21* 19*  GLUCOSE 454* 332* 266* 179* 154* 233*  BUN 60* 64* 65* 66* 67* 70*  CREATININE 4.01* 4.18* 4.64* 4.75* 4.94* 5.32*  CALCIUM 8.8* 9.1 8.9 8.9 8.5* 8.7*  PHOS  --  6.0*  --  5.7*  --   --     Physical Exam  Blood pressure 95/64, pulse 102, temperature 99.3 F (37.4 C), temperature source Oral, resp. rate 20, height 6\' 2"  (1.88 m), weight 97.977 kg (216 lb), SpO2 92 %. GEN: NAD, elderly male lying in bed ENT: NCAT EYES: EOMI CV: RRR, no rub PULM: CTAB, poor effort, diminished in bases ABD: s/nt.  Mild distension SKIN: no rashes/lesions [id number] pedal edema  Assessment 36M with renal insufficiency, unclear duration, mild hyperkalemia, admitted after down on ground for 3d; on ACEi; with mild rhabdomyolysis; diabetic foot ulcer  1. AoCKD; not obstructed, foley in place; suspect due to #3, #6, #7 2. Diabetic Foot Ulcer  3. Mild Rhabdomyolysis 4. Mild Hyperkalemia, resolved 5. DM2 6. HTN on ACEi 7. Dehydration 8. Debility  / Loss of independence  11/17: A1c 12.2%; SCr 2.02, K 5.9  Plan 1. Resume hydration, given inc CK will use NaHCO3 138mEq at 135mL/hr x24h 2. Await ortho eval 3. Check UP/C Daily weights, Daily Renal Panel, Strict I/Os, Avoid nephrotoxins (NSAIDs, judicious IV Contrast)    Pearson Grippe MD [phone] pgr 06/18/2015, 1:01 PM

## 2015-06-18 NOTE — Progress Notes (Signed)
Pt. Unable to answer appropriately as to where he is, what month it is or what day it is. Unable to obtain consent for amputation of great toe and second toe of left foot at this time. Dorthey Sawyer, RN

## 2015-06-18 NOTE — Progress Notes (Signed)
Inpatient Diabetes Program Recommendations  AACE/ADA: New Consensus Statement on Inpatient Glycemic Control (2015)  Target Ranges:  Prepandial:   less than 140 mg/dL      Peak postprandial:   less than 180 mg/dL (1-2 hours)      Critically ill patients:  140 - 180 mg/dL   Review of Glycemic Control  Inpatient Diabetes Program Recommendations:  Insulin - Basal: consider increasing Lantus to 20 units  HgbA1C: ordered Thank you  Raoul Pitch BSN, RN,CDE Inpatient Diabetes Coordinator [phone] (team pager)

## 2015-06-18 NOTE — Progress Notes (Signed)
New Admission Note:   Arrival:  From ED with tech on stretcher Mental Orientation: A&Ox4 Telemetry: Placed on box 6e16 Assessment:  See doc flowsheet Skin: Diabetic ulcer located in between left great toe and second toe.  Purulent drainage.  Foul odor.  Dressed with gauze.  Skin flaky and dry.  Otherwise, intact. IV: Left wrist IV Pain: None Safety Measures:  Call bell placed within reach; patient instructed on use of call bell and verbalized understanding. Bed in lowest position. Bed alarm on. [address] Orientation: Patient oriented to staff, room, and unit. Family: None at bedside  Orders have been reviewed and implemented. Patient stated he was too tired at this time to complete admission questions.  Will continue to monitor.  Arlyss Queen, RN, BSN

## 2015-06-18 NOTE — Progress Notes (Signed)
[facility] PROGRESS NOTE  David Ford [id number] DOB: April 16, 1941 DOA: 06/17/2015 PCP: No primary care provider on file.  Brief Summary  David Ford is a 74 y.o. male with history of diabetes mellitus with noncompliance, memory loss, hypertension.  Patient is a poor historian and gives a different history each time he is asked about the events preceding admission. Patient lives at home alone.  He was found by a friend on the floor who called EMS.  His daughter reported that he had had swelling and darkness of the toes on his left foot for several months.  When she saw his toes during this admission, however, they appeared much more swollen and malodorous than before. Emergency department he was found to have evidence of rhabdomyolysis with CKs 3053, his initial lab work showing creatinine of 4 potassium was 6.0 and an gap was 16. He was unable to produce urine. Troponin slightly elevated to 0.05 albumin 2.9, white blood cell, was noted to be elevated 16.2.  Hospitalist was called for admission for dehydration rhabdomyolysis evidence of acute renal failure   Assessment/Plan  Sepsis due to diabetic foot ulcer with gangrene (tachycardia and leukocytosis) -  Continue vancomycin and zosyn -  Dr. Sharol Given consulted for possible amputation  AKI, creatinine trending up, likely due to ACEI, progressive diabetic nephropathy, mild rhabdomyoloysis.   Persistent metabolic acidosis likely secondary to RTA.  Hyperkalemia secondary to AKI.   - appreciate nephrology assistance -  IVF with bicarb ordered -  Urine protein-creatinine ratio -  Daily weights -  Strict I/O -  Avoid NSAIDS and IV contrast  Probable demand ischemia with mildly elevated troponin in setting of renal failure and infection -  ECHO pending -  Cardiology consult for preoperative clearance   Mild gap metabolic acidosis likely secondary to RTA.   -  Bicarb added to IVF -  Repeat BMP in AM  Hyponatremia due to AKI, stable  asymptomatic  Hyperkalemia secondary to AKI, resolved, but borderline high -  Change to Renal diet  Hypertension, blood pressures low normal to mildly hypotensive -  Continue to hold blood pressure medications  Leukocytosis due to infection and gangrene -  Trend while on antibiotics  Diabetes mellitus type 2, A1c pending -  D/c insulin gtt -  Add levemir 10 units -  Start SSI  Bilateral shoulder pain, seems to be impingement or rotator cuff injury.  Exam not suggestive of intraarticular problem -  XR right shoulder:  No fracture, but difficult exam  -  Consider MRI as outpatient  Diet:  Renal, carb modified Access:  PIV IVF:  yes Proph:  heparin  Code Status: full Family Communication: patient and his daughter Disposition Plan: pending probable amputation    Consultants:  Orthopedic surgery  Nephrology  Cardiology  Procedures:  RUS  ECHO    Antibiotics:  Zosyn 12/28 >  Vanc 12/29 >   HPI/Subjective:  Confused but able to answer direct questions.  Denies shortness of breath, chest pain.  Has bilateral shoulder pain and some neck pain.  Foot has bothered him for several days.     Objective: Filed Vitals:   06/18/15 0100 06/18/15 0508 06/18/15 0805 06/18/15 1152  BP: 143/78 123/67 115/54 95/64  Pulse: 96 82 82 102  Temp: 97.7 F (36.5 C) 99.1 F (37.3 C) 97.7 F (36.5 C) 99.3 F (37.4 C)  TempSrc: Oral Oral Oral Oral  Resp: 16 16 16 20   Height:      Weight:  SpO2: 94% 95% 96% 92%    Intake/Output Summary (Last 24 hours) at 06/18/15 1554 Last data filed at 06/18/15 1514  Gross per 24 hour  Intake   1220 ml  Output    400 ml  Net    820 ml   Filed Weights   06/17/15 2100  Weight: 97.977 kg (216 lb)   Body mass index is 27.72 kg/(m^2).  Exam:   General:  Adult male, No acute distress  HEENT:  NCAT, MMM  Cardiovascular:  RRR, nl S1, S2 no mrg, warm extremities  Respiratory:  CTAB, no increased WOB  Abdomen:   NABS, soft,  NT/ND  MSK:   Normal tone and bulk, left foot swollen, malodorous, darkening of 1st and second toes with somewhat purulent discharge from between toes.  2+ bilateral pedal pulses  Data Reviewed: Basic Metabolic Panel:  Recent Labs Lab 06/17/15 2235 06/18/15 0230 06/18/15 0408 06/18/15 0635 06/18/15 1100 06/18/15 1258  NA 133* 132* 133* 130* 131* 131*  K 6.3* 4.7 5.0 4.7 4.9 5.1  CL 95* 98* 98* 98* 97* 95*  CO2 23 23 23  21* 19* 22  GLUCOSE 332* 266* 179* 154* 233* 242*  BUN 64* 65* 66* 67* 70* 72*  CREATININE 4.18* 4.64* 4.75* 4.94* 5.32* 5.60*  CALCIUM 9.1 8.9 8.9 8.5* 8.7* 8.6*  MG 2.2  --  2.2  --   --   --   PHOS 6.0*  --  5.7*  --   --   --    Liver Function Tests:  Recent Labs Lab 06/17/15 1708 06/18/15 0408  AST 73* 68*  ALT 29 28  ALKPHOS 91 91  BILITOT 0.6 0.5  PROT 6.6 5.8*  ALBUMIN 2.9* 2.6*   No results for input(s): LIPASE, AMYLASE in the last 168 hours. No results for input(s): AMMONIA in the last 168 hours. CBC:  Recent Labs Lab 06/17/15 1708 06/17/15 2235 06/18/15 0408  WBC 16.2* 16.1* 12.9*  HGB 11.4* 12.1* 11.2*  HCT 35.1* 37.0* 34.4*  MCV 84.4 85.5 85.4  PLT 208 196 208    No results found for this or any previous visit (from the past 240 hour(s)).   Studies: Dg Chest 2 View  06/17/2015  CLINICAL DATA:  Weakness. EXAM: CHEST  2 VIEW COMPARISON:  None. FINDINGS: Trachea is midline. Heart size within normal limits. Lungs are clear. No pleural fluid. Degenerative changes are seen in the spine. IMPRESSION: No acute findings. Electronically Signed   By: Lorin Picket M.D.   On: 06/17/2015 18:10   Dg Abd 1 View  06/17/2015  CLINICAL DATA:  Abdominal distention EXAM: ABDOMEN - 1 VIEW COMPARISON:  None. FINDINGS: Brachytherapy seeds overlie the prostate. There mildly dilated small bowel loops in the central abdomen measuring up to 3.8 cm in diameter. Mild stool is seen throughout the colon. No evidence of pneumatosis or pneumoperitoneum. No  pathologic soft tissue calcifications. IMPRESSION: Mildly dilated small bowel loops in the central abdomen, for which the differential includes a mid to distal small bowel obstruction or adynamic ileus. Electronically Signed   By: Ilona Sorrel M.D.   On: 06/17/2015 21:12   US Renal  06/18/2015  CLINICAL DATA:  Acute onset of renal failure.  Initial encounter. EXAM: RENAL / URINARY TRACT ULTRASOUND COMPLETE COMPARISON:  None. FINDINGS: Right Kidney: Length: 10.2 cm. Increased parenchymal echogenicity noted. A small 1.1 cm cyst is noted at the interpole region of the right kidney. No hydronephrosis visualized. Left Kidney: Length: 11.9 cm. Mildly increased  parenchymal echogenicity noted. No mass or hydronephrosis visualized. Bladder: Decompressed and not well assessed. IMPRESSION: 1. No evidence of hydronephrosis. 2. Increased renal parenchymal echogenicity likely reflects medical renal disease. 3. Small right renal cyst noted. Electronically Signed   By: Garald Balding M.D.   On: 06/18/2015 00:41   Dg Shoulder Right Port  06/18/2015  CLINICAL DATA:  Patient with diffuse right shoulder pain. No known injury. EXAM: PORTABLE RIGHT SHOULDER - 2+ VIEW COMPARISON:  Chest radiograph 06/17/2015 FINDINGS: Limited evaluation due to inability to position patient. No evidence for displaced fracture. AC joint degenerative changes. Visualized right hemi thorax is unremarkable. IMPRESSION: Limited exam due to difficulty with patient positioning. Within the above limitation, no evidence for displaced fracture. Recommend evaluation with standard radiographic views when patient clinically able. Electronically Signed   By: Lovey Newcomer M.D.   On: 06/18/2015 13:28   Dg Foot Complete Left  06/17/2015  CLINICAL DATA:  Unable to walk.  Foot pain.  Diabetes. EXAM: LEFT FOOT - COMPLETE 3+ VIEW COMPARISON:  None. FINDINGS: Questionable soft tissue ulceration at the tips of the first and second toes. There is absence of the left  great toe distal phalanx, likely chronic. No visible radiographic changes of acute osteomyelitis. No fracture, subluxation or dislocation. IMPRESSION: No radiographic changes of osteomyelitis. Electronically Signed   By: Rolm Baptise M.D.   On: 06/17/2015 18:11    Scheduled Meds: . aspirin EC  325 mg Oral Daily  . docusate sodium  100 mg Oral BID  . feeding supplement (GLUCERNA SHAKE)  237 mL Oral TID BM  . folic acid  1 mg Oral Daily  . heparin subcutaneous  5,000 Units Subcutaneous 3 times per day  . insulin aspart  0-9 Units Subcutaneous 4 times per day  . metoCLOPramide (REGLAN) injection  5 mg Intravenous 4 times per day  . piperacillin-tazobactam (ZOSYN)  IV  2.25 g Intravenous Q8H  . senna  1 tablet Oral BID  . sodium chloride  3 mL Intravenous Q12H  . thiamine  100 mg Oral Daily  . vancomycin  1,000 mg Intravenous Q48H   Continuous Infusions: .  sodium bicarbonate 150 mEq in sterile water 1000 mL infusion 100 mL/hr at 06/18/15 1422    Active Problems:   Hypertension   Leukocytosis   Elevated troponin   Hyponatremia   Hypoalbuminemia   Hyperkalemia   Rhabdomyolysis   Metabolic acidosis   Acute renal failure (ARF) ([place])   DM type 2 causing renal disease ([place])   Dehydration   Debility   Diabetic foot infection ([place])   Ileus ([place])    Time spent: 30 min    Porcia Morganti, [facility] Pager [phone]. If 7PM-7AM, please contact night-coverage at [email or web address], password [facility] 06/18/2015, 3:54 PM  LOS: 1 day
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
## 2015-06-18 NOTE — Progress Notes (Signed)
Utilization review completed. Lewi Drost, RN, BSN. 

## 2015-06-18 NOTE — Progress Notes (Signed)
Initial Nutrition Assessment  DOCUMENTATION CODES:   Not applicable  INTERVENTION:  Provide Glucerna Shake po TID, each supplement provides 220 kcal and 10 grams of protein.  Encourage adequate PO intake.   NUTRITION DIAGNOSIS:   Increased nutrient needs related to wound healing as evidenced by estimated needs.  GOAL:   Patient will meet greater than or equal to 90% of their needs  MONITOR:   PO intake, Supplement acceptance, Skin, Weight trends, Labs, I & O's  REASON FOR ASSESSMENT:   Consult Assessment of nutrition requirement/status  ASSESSMENT:   74 yo M with hx of DM, HTN Here with generalized weakness was found to have ARF most likely due to dehydration in the setting of ACEi and was found to have untreated diabetic foot ulcer  Meal completion at lunch today was 25%. Pt reports usually eating well PTA with consumption of at least 3 meals a day. Usual body weight reported to be ~215 lbs. Daughter at bedside reports pt does not follow a diabetic diet at home. Pt may benefit from an diet education in the near future. Pt is agreeable to nutritional supplements to aid in caloric and protein needs as well as in wound healing. RD to order. Pt was encouraged to eat his food at meals.   Pt with no observed significant fat or muscle mass loss.   Labs and medications reviewed.   Diet Order:  Diet Carb Modified Fluid consistency:: Thin; Room service appropriate?: Yes  Skin:  Wound (see comment) (Diabetic ulcer L toe)  Last BM:  Unknown  Height:   Ht Readings from Last 1 Encounters:  06/17/15 6\' 2"  (1.88 m)    Weight:   Wt Readings from Last 1 Encounters:  06/17/15 216 lb (97.977 kg)    Ideal Body Weight:  86.4 kg  BMI:  Body mass index is 27.72 kg/(m^2).  Estimated Nutritional Needs:   Kcal:  2400-2600  Protein:  115-125 grams  Fluid:  >/= 2.4 L/day  EDUCATION NEEDS:   No education needs identified at this time  Corrin Parker, MS, RD, LDN Pager #  [phone] After hours/ weekend pager # [phone]

## 2015-06-19 ENCOUNTER — Inpatient Hospital Stay (HOSPITAL_COMMUNITY): Payer: Medicare HMO

## 2015-06-19 ENCOUNTER — Encounter (HOSPITAL_COMMUNITY)
Admission: EM | Disposition: A | Discharge status: Skilled Nursing Facility | Payer: Self-pay | Source: Home / Self Care | Attending: Internal Medicine

## 2015-06-19 ENCOUNTER — Encounter (HOSPITAL_COMMUNITY): Payer: Self-pay | Admitting: Gastroenterology

## 2015-06-19 DIAGNOSIS — M6282 Rhabdomyolysis: Secondary | ICD-10-CM

## 2015-06-19 DIAGNOSIS — K5669 Other intestinal obstruction: Secondary | ICD-10-CM

## 2015-06-19 DIAGNOSIS — K920 Hematemesis: Secondary | ICD-10-CM | POA: Diagnosis present

## 2015-06-19 DIAGNOSIS — K56609 Unspecified intestinal obstruction, unspecified as to partial versus complete obstruction: Secondary | ICD-10-CM | POA: Diagnosis present

## 2015-06-19 DIAGNOSIS — K567 Ileus, unspecified: Secondary | ICD-10-CM

## 2015-06-19 DIAGNOSIS — N170 Acute kidney failure with tubular necrosis: Secondary | ICD-10-CM

## 2015-06-19 DIAGNOSIS — N179 Acute kidney failure, unspecified: Secondary | ICD-10-CM

## 2015-06-19 DIAGNOSIS — R140 Abdominal distension (gaseous): Secondary | ICD-10-CM

## 2015-06-19 DIAGNOSIS — E860 Dehydration: Secondary | ICD-10-CM

## 2015-06-19 LAB — RENAL FUNCTION PANEL
ANION GAP: 21 — AB (ref 5–15)
Albumin: 2.2 g/dL — ABNORMAL LOW (ref 3.5–5.0)
Albumin: 2.2 g/dL — ABNORMAL LOW (ref 3.5–5.0)
Anion gap: 22 — ABNORMAL HIGH (ref 5–15)
BUN: 91 mg/dL — AB (ref 6–20)
BUN: 101 mg/dL — ABNORMAL HIGH (ref 6–20)
CHLORIDE: 86 mmol/L — AB (ref 101–111)
CHLORIDE: 88 mmol/L — AB (ref 101–111)
CO2: 19 mmol/L — AB (ref 22–32)
CO2: 20 mmol/L — ABNORMAL LOW (ref 22–32)
CREATININE: 7.45 mg/dL — AB (ref 0.61–1.24)
Calcium: 8.3 mg/dL — ABNORMAL LOW (ref 8.9–10.3)
Calcium: 8.2 mg/dL — ABNORMAL LOW (ref 8.9–10.3)
Creatinine, Ser: 8.17 mg/dL — ABNORMAL HIGH (ref 0.61–1.24)
GFR calc Af Amer: 7 mL/min — ABNORMAL LOW (ref >60)
GFR, EST AFRICAN AMERICAN: 7 mL/min — AB (ref >60)
GFR, EST NON AFRICAN AMERICAN: 6 mL/min — AB (ref >60)
GFR, EST NON AFRICAN AMERICAN: 6 mL/min — AB (ref >60)
Glucose, Bld: 340 mg/dL — ABNORMAL HIGH (ref 65–99)
Glucose, Bld: 357 mg/dL — ABNORMAL HIGH (ref 65–99)
POTASSIUM: 4.8 mmol/L (ref 3.5–5.1)
Phosphorus: 8.3 mg/dL — ABNORMAL HIGH (ref 2.5–4.6)
Phosphorus: 7.1 mg/dL — ABNORMAL HIGH (ref 2.5–4.6)
Potassium: 5 mmol/L (ref 3.5–5.1)
Sodium: 127 mmol/L — ABNORMAL LOW (ref 135–145)
Sodium: 129 mmol/L — ABNORMAL LOW (ref 135–145)

## 2015-06-19 LAB — COMPREHENSIVE METABOLIC PANEL
ALBUMIN: 2.1 g/dL — AB (ref 3.5–5.0)
ALK PHOS: 83 U/L (ref 38–126)
ALT: 45 U/L (ref 17–63)
AST: 69 U/L — AB (ref 15–41)
Anion gap: 21 — ABNORMAL HIGH (ref 5–15)
BILIRUBIN TOTAL: 1 mg/dL (ref 0.3–1.2)
BUN: 100 mg/dL — AB (ref 6–20)
CALCIUM: 8.1 mg/dL — AB (ref 8.9–10.3)
CO2: 19 mmol/L — AB (ref 22–32)
Chloride: 86 mmol/L — ABNORMAL LOW (ref 101–111)
Creatinine, Ser: 8.15 mg/dL — ABNORMAL HIGH (ref 0.61–1.24)
GFR calc Af Amer: 7 mL/min — ABNORMAL LOW (ref >60)
GFR calc non Af Amer: 6 mL/min — ABNORMAL LOW (ref >60)
GLUCOSE: 367 mg/dL — AB (ref 65–99)
Potassium: 5 mmol/L (ref 3.5–5.1)
Sodium: 126 mmol/L — ABNORMAL LOW (ref 135–145)
TOTAL PROTEIN: 5.7 g/dL — AB (ref 6.5–8.1)

## 2015-06-19 LAB — CBC
HCT: 36.1 % — ABNORMAL LOW (ref 39.0–52.0)
HCT: 32 % — ABNORMAL LOW (ref 39.0–52.0)
Hemoglobin: 11.9 g/dL — ABNORMAL LOW (ref 13.0–17.0)
Hemoglobin: 10.8 g/dL — ABNORMAL LOW (ref 13.0–17.0)
MCH: 27.4 pg (ref 26.0–34.0)
MCH: 28 pg (ref 26.0–34.0)
MCHC: 33 g/dL (ref 30.0–36.0)
MCHC: 33.8 g/dL (ref 30.0–36.0)
MCV: 83.2 fL (ref 78.0–100.0)
MCV: 82.9 fL (ref 78.0–100.0)
PLATELETS: 233 10*3/uL (ref 150–400)
PLATELETS: 214 10*3/uL (ref 150–400)
RBC: 4.34 MIL/uL (ref 4.22–5.81)
RBC: 3.86 MIL/uL — ABNORMAL LOW (ref 4.22–5.81)
RDW: 14.2 % (ref 11.5–15.5)
RDW: 14.2 % (ref 11.5–15.5)
WBC: 16.4 10*3/uL — ABNORMAL HIGH (ref 4.0–10.5)
WBC: 15.1 10*3/uL — AB (ref 4.0–10.5)

## 2015-06-19 LAB — URINE CULTURE: CULTURE: NO GROWTH

## 2015-06-19 LAB — GLUCOSE, CAPILLARY
GLUCOSE-CAPILLARY: 342 mg/dL — AB (ref 65–99)
GLUCOSE-CAPILLARY: 319 mg/dL — AB (ref 65–99)
Glucose-Capillary: 342 mg/dL — ABNORMAL HIGH (ref 65–99)
Glucose-Capillary: 340 mg/dL — ABNORMAL HIGH (ref 65–99)

## 2015-06-19 LAB — TYPE AND SCREEN
ABO/RH(D): O POS
ANTIBODY SCREEN: NEGATIVE

## 2015-06-19 LAB — TROPONIN I
Troponin I: 0.13 ng/mL — ABNORMAL HIGH (ref <0.031)
Troponin I: 0.07 ng/mL — ABNORMAL HIGH (ref <0.031)

## 2015-06-19 LAB — HEMOGLOBIN A1C
HEMOGLOBIN A1C: 14.2 % — AB (ref 4.8–5.6)
MEAN PLASMA GLUCOSE: 361 mg/dL

## 2015-06-19 LAB — CK: Total CK: 951 U/L — ABNORMAL HIGH (ref 49–397)

## 2015-06-19 LAB — ABO/RH: ABO/RH(D): O POS

## 2015-06-19 LAB — SURGICAL PCR SCREEN
MRSA, PCR: NEGATIVE
STAPHYLOCOCCUS AUREUS: NEGATIVE

## 2015-06-19 LAB — HEMOGLOBIN AND HEMATOCRIT, BLOOD
HCT: 32 % — ABNORMAL LOW (ref 39.0–52.0)
Hemoglobin: 11.1 g/dL — ABNORMAL LOW (ref 13.0–17.0)

## 2015-06-19 LAB — LACTIC ACID, PLASMA: LACTIC ACID, VENOUS: 1.7 mmol/L (ref 0.5–2.0)

## 2015-06-19 LAB — PROCALCITONIN: PROCALCITONIN: 52.55 ng/mL

## 2015-06-19 SURGERY — AMPUTATION, FOOT, RAY
Anesthesia: Choice | Laterality: Left

## 2015-06-19 MED ORDER — BISACODYL 10 MG RE SUPP
10.0000 mg | Freq: Once | RECTAL | Status: AC
  Administered 2015-06-19: 10 mg via RECTAL
  Filled 2015-06-19: qty 1

## 2015-06-19 MED ORDER — BARIUM SULFATE 2.1 % PO SUSP
ORAL | Status: AC
  Administered 2015-06-19: 450 mL via ORAL
  Filled 2015-06-19: qty 2

## 2015-06-19 MED ORDER — SODIUM CHLORIDE 0.9 % IV SOLN
80.0000 mg | Freq: Once | INTRAVENOUS | Status: AC
  Administered 2015-06-19: 80 mg via INTRAVENOUS
  Filled 2015-06-19: qty 80

## 2015-06-19 MED ORDER — INSULIN GLARGINE 100 UNIT/ML ~~LOC~~ SOLN
10.0000 [IU] | Freq: Every day | SUBCUTANEOUS | Status: DC
  Administered 2015-06-19: 10 [IU] via SUBCUTANEOUS
  Filled 2015-06-19: qty 0.1

## 2015-06-19 MED ORDER — SODIUM CHLORIDE 0.9 % IV SOLN
8.0000 mg/h | INTRAVENOUS | Status: DC
  Administered 2015-06-19 – 2015-06-22 (×4): 8 mg/h via INTRAVENOUS
  Filled 2015-06-19 (×14): qty 80

## 2015-06-19 MED ORDER — INSULIN ASPART 100 UNIT/ML ~~LOC~~ SOLN: 2.0000 [IU] | Freq: Three times a day (TID) | SUBCUTANEOUS | Status: DC

## 2015-06-19 MED ORDER — INSULIN ASPART 100 UNIT/ML ~~LOC~~ SOLN
0.0000 [IU] | SUBCUTANEOUS | Status: DC
  Administered 2015-06-19: 7 [IU] via SUBCUTANEOUS
  Administered 2015-06-19: 5 [IU] via SUBCUTANEOUS
  Administered 2015-06-20: 2 [IU] via SUBCUTANEOUS
  Administered 2015-06-20: 5 [IU] via SUBCUTANEOUS
  Administered 2015-06-20: 3 [IU] via SUBCUTANEOUS
  Administered 2015-06-20: 7 [IU] via SUBCUTANEOUS
  Administered 2015-06-21: 1 [IU] via SUBCUTANEOUS
  Administered 2015-06-21: 2 [IU] via SUBCUTANEOUS
  Administered 2015-06-21: 1 [IU] via SUBCUTANEOUS
  Administered 2015-06-21: 2 [IU] via SUBCUTANEOUS

## 2015-06-19 MED ORDER — ONDANSETRON HCL 4 MG/2ML IJ SOLN
4.0000 mg | Freq: Four times a day (QID) | INTRAMUSCULAR | Status: DC | PRN
  Administered 2015-06-26: 4 mg via INTRAVENOUS

## 2015-06-19 MED ORDER — INSULIN GLARGINE 100 UNIT/ML ~~LOC~~ SOLN
15.0000 [IU] | Freq: Every day | SUBCUTANEOUS | Status: DC
  Administered 2015-06-20 – 2015-06-21 (×2): 15 [IU] via SUBCUTANEOUS
  Filled 2015-06-19 (×3): qty 0.15

## 2015-06-19 MED ORDER — POLYETHYLENE GLYCOL 3350 17 G PO PACK: 17.0000 g | PACK | Freq: Every day | ORAL | Status: DC

## 2015-06-19 MED ORDER — PANTOPRAZOLE SODIUM 40 MG IV SOLR
40.0000 mg | Freq: Two times a day (BID) | INTRAVENOUS | Status: DC
  Administered 2015-06-23 – 2015-06-29 (×14): 40 mg via INTRAVENOUS
  Filled 2015-06-19 (×15): qty 40

## 2015-06-19 MED ORDER — BARIUM SULFATE 2.1 % PO SUSP
450.0000 mL | ORAL | Status: AC
  Administered 2015-06-19: 450 mL via ORAL

## 2015-06-19 MED ORDER — TAMSULOSIN HCL 0.4 MG PO CAPS
0.4000 mg | ORAL_CAPSULE | Freq: Every day | ORAL | Status: DC
Start: 2015-06-19 — End: 2015-06-19

## 2015-06-19 MED ORDER — HEPARIN 1000 UNIT/ML FOR PERITONEAL DIALYSIS
2.4000 mL | Freq: Once | INTRAMUSCULAR | Status: AC
  Administered 2015-06-19: 2400 [IU] via INTRAPERITONEAL
  Filled 2015-06-19: qty 2.4

## 2015-06-19 MED ORDER — HYDROMORPHONE HCL 1 MG/ML IJ SOLN
0.5000 mg | INTRAMUSCULAR | Status: DC | PRN
  Administered 2015-06-19 – 2015-06-24 (×7): 0.5 mg via INTRAVENOUS
  Filled 2015-06-19 (×6): qty 1

## 2015-06-19 NOTE — NC FL2 (Signed)
[place] LEVEL OF CARE SCREENING TOOL     IDENTIFICATION  Patient Name: David Ford Birthdate: 1941/04/27 Sex: male Admission Date (Current Location): 06/17/2015  [facility] and [place] Number:  Herbalist and Address:  [facility], [place] [address] [place], [place] [address]      Provider Number: [id number]  Attending Physician Name and Address:  Janece Canterbury, MD  Relative Name and Phone Number:       Current Level of Care: Hospital Recommended Level of Care: [place] Prior Approval Number:    Date Approved/Denied:   PASRR Number:   [id number]  Discharge Plan: SNF    Current Diagnoses: Patient Active Problem List   Diagnosis Date Noted  . Ileus ([place]) 06/18/2015  . Hypertension 06/17/2015  . Leukocytosis 06/17/2015  . Elevated troponin 06/17/2015  . Hyponatremia 06/17/2015  . Hypoalbuminemia 06/17/2015  . Hyperkalemia 06/17/2015  . Rhabdomyolysis 06/17/2015  . Metabolic acidosis [id number]  . Acute renal failure (ARF) ([place]) 06/17/2015  . DM type 2 causing renal disease ([place]) 06/17/2015  . Dehydration 06/17/2015  . Debility 06/17/2015  . Diabetic foot infection ([place]) 06/17/2015  . Abdominal distension   . Non-traumatic rhabdomyolysis     Orientation RESPIRATION BLADDER Height & Weight    Self  Normal Indwelling catheter 6\' 2"  (188 cm) 200 lbs.  BEHAVIORAL SYMPTOMS/MOOD NEUROLOGICAL BOWEL NUTRITION STATUS      Incontinent    AMBULATORY STATUS COMMUNICATION OF NEEDS Skin   Limited Assist Verbally Surgical wounds (Amputation of Left great toe and second toe)                       Personal Care Assistance Level of Assistance  Bathing, Feeding, Dressing Bathing Assistance: Limited assistance Feeding assistance: Independent Dressing Assistance: Limited assistance     Functional Limitations Info  Sight, Hearing, Speech Sight Info: Adequate Hearing Info: Adequate Speech Info:  Adequate    SPECIAL CARE FACTORS FREQUENCY  PT (By licensed PT), OT (By licensed OT)     PT Frequency: 3 OT Frequency: 3            Contractures Contractures Info: Not present    Additional Factors Info  Insulin Sliding Scale, Code Status, Allergies Code Status Info: Full Code Allergies Info: No Known Allergies   Insulin Sliding Scale Info: 3 times daily with meals       Current Medications (06/19/2015):  This is the current hospital active medication list Current Facility-Administered Medications  Medication Dose Route Frequency Provider Last Rate Last Dose  . 0.45 % sodium chloride infusion   Intravenous Continuous Newt Minion, MD   Stopped at 06/19/15 [phone]  . acetaminophen (TYLENOL) tablet 650 mg  650 mg Oral Q6H PRN Toy Baker, MD       Or  . acetaminophen (TYLENOL) suppository 650 mg  650 mg Rectal Q6H PRN Toy Baker, MD      . aspirin EC tablet 325 mg  325 mg Oral Daily Janece Canterbury, MD   325 mg at 06/18/15 1100  . dextrose 50 % solution 25 mL  25 mL Intravenous PRN Toy Baker, MD      . docusate sodium (COLACE) capsule 100 mg  100 mg Oral BID Toy Baker, MD   100 mg at 06/18/15 2211  . feeding supplement (GLUCERNA SHAKE) (GLUCERNA SHAKE) liquid 237 mL  237 mL Oral TID BM Dale Thurmont, RD   237 mL at 06/18/15 1600  .  folic acid (FOLVITE) tablet 1 mg  1 mg Oral Daily Toy Baker, MD   1 mg at 06/18/15 1100  . heparin injection 5,000 Units  5,000 Units Subcutaneous 3 times per day Janece Canterbury, MD   5,000 Units at 06/18/15 2211  . HYDROcodone-acetaminophen (NORCO/VICODIN) 5-325 MG per tablet 1-2 tablet  1-2 tablet Oral Q4H PRN Toy Baker, MD      . insulin aspart (novoLOG) injection 0-9 Units  0-9 Units Subcutaneous TID WC Janece Canterbury, MD   7 Units at 06/19/15 [phone]  . insulin aspart (novoLOG) injection 2 Units  2 Units Subcutaneous TID WC Janece Canterbury, MD   2 Units at 06/19/15 0800  . insulin glargine  (LANTUS) injection 10 Units  10 Units Subcutaneous Daily Janece Canterbury, MD      . metoCLOPramide (REGLAN) injection 5 mg  5 mg Intravenous 4 times per day Toy Baker, MD   5 mg at 06/19/15 0521  . ondansetron (ZOFRAN) tablet 4 mg  4 mg Oral Q6H PRN Toy Baker, MD       Or  . ondansetron (ZOFRAN) injection 4 mg  4 mg Intravenous Q6H PRN Toy Baker, MD      . piperacillin-tazobactam (ZOSYN) IVPB 2.25 g  2.25 g Intravenous Q8H Rebecka Apley, RPH   2.25 g at 06/19/15 0520  . polyethylene glycol (MIRALAX / GLYCOLAX) packet 17 g  17 g Oral Daily PRN Toy Baker, MD      . senna (SENOKOT) tablet 8.6 mg  1 tablet Oral BID Toy Baker, MD   8.6 mg at 06/18/15 2211  . sodium bicarbonate 150 mEq in sterile water 1,000 mL infusion   Intravenous Continuous Rexene Agent, MD 100 mL/hr at 06/19/15 0620    . sodium chloride 0.9 % injection 3 mL  3 mL Intravenous Q12H Toy Baker, MD   3 mL at 06/18/15 2211  . thiamine (VITAMIN B-1) tablet 100 mg  100 mg Oral Daily Toy Baker, MD   100 mg at 06/18/15 1100  . vancomycin (VANCOCIN) IVPB 1000 mg/200 mL premix  1,000 mg Intravenous Q48H Janece Canterbury, MD   1,000 mg at 06/18/15 1421     Discharge Medications: Please see discharge summary for a list of discharge medications.  Relevant Imaging Results:  Relevant Lab Results:   Additional Information SSN [id number]  [name], [place]

## 2015-06-19 NOTE — Progress Notes (Signed)
Spoke with patient and family.  Feeling better after NG tube placed.  PCCM has placed HD catheter and NG has been repositioned adequately in the stomach.  Awaiting CT abd/pelvis.  GI following given hematemesis, however, fortunately, his bleeding has slowed.

## 2015-06-19 NOTE — Progress Notes (Signed)
Patient transfer to stepdown due to GIB, SBO, acute renal failure needing HD catheter, demand ischemia, sepsis with gangrenous foot. NG tube placed before transfer.  He had projectile vomiting while placing NG tube with coffee ground emesis.  The total output was 1100.  Given report.  He was scheduled for amputation of left great toe at 3 pm, but might not happen till patient is stabilized.

## 2015-06-19 NOTE — Progress Notes (Addendum)
Admit: 06/17/2015 LOS: 2  47M with renal insufficiency, unclear duration, mild hyperkalemia, admitted after down on ground for 3d; on ACEi; with mild rhabdomyolysis; diabetic foot ulcer  Subjective:  Placed on 1/2 NS overnight, mild hyponatremia this AM For toe amputation today SCr further increased No AM labs yet  UP/C 1.67 Pt with abd distension, IVFs held, no BM yet  12/29 0701 - 12/30 0700 In: 930 [P.O.:240; I.V.:590; IV Piggyback:100] Out: 0   Filed Weights   06/17/15 2100 06/18/15 2133  Weight: 97.977 kg (216 lb) 90.719 kg (200 lb)    Scheduled Meds: . aspirin EC  325 mg Oral Daily  . bisacodyl  10 mg Rectal Once  . docusate sodium  100 mg Oral BID  . feeding supplement (GLUCERNA SHAKE)  237 mL Oral TID BM  . folic acid  1 mg Oral Daily  . heparin subcutaneous  5,000 Units Subcutaneous 3 times per day  . insulin aspart  0-9 Units Subcutaneous TID WC  . insulin aspart  2 Units Subcutaneous TID WC  . insulin glargine  10 Units Subcutaneous Daily  . metoCLOPramide (REGLAN) injection  5 mg Intravenous 4 times per day  . piperacillin-tazobactam (ZOSYN)  IV  2.25 g Intravenous Q8H  . polyethylene glycol  17 g Oral Daily  . senna  1 tablet Oral BID  . sodium chloride  3 mL Intravenous Q12H  . thiamine  100 mg Oral Daily  . vancomycin  1,000 mg Intravenous Q48H   Continuous Infusions:   PRN Meds:.acetaminophen **OR** acetaminophen, dextrose, HYDROcodone-acetaminophen, ondansetron **OR** ondansetron (ZOFRAN) IV  Current Labs: reviewed    Physical Exam:  Blood pressure 147/68, pulse 93, temperature 98.4 F (36.9 C), temperature source Oral, resp. rate 20, height 6\' 2"  (1.88 m), weight 90.719 kg (200 lb), SpO2 94 %. GEN: NAD, elderly male lying in bed ENT: NCAT EYES: EOMI CV: RRR, no rub PULM: CTAB, poor effort, diminished in bases ABD: s/nt. Mild distension SKIN: no rashes/lesions [id number] pedal edema  A 1. AoCKD3 (SCr 04/2015 2.0) with subnephrotic  proteinuria; not obstructed, foley in place; suspect due to #3, #6, #7 2. Diabetic Foot Ulcer  3. Mild Rhabdomyolysis 4. Mild Hyperkalemia, resolved 5. DM2 6. HTN on ACEi 7. Dehydration 8. Debility / Loss of independence  9. Abd Distension 10. Constipation  Plan 1. OK with held IVFs while abdomen distended 2. Laxative per TRH 3. Very worriome prognosis, await AM labs 4. Surgery toady 4. Daily weights, Daily Renal Panel, Strict I/Os, Avoid nephrotoxins (NSAIDs, judicious IV Contrast)   Update 12:30PM: Received request to update daughter who was not able to be here, residing in [place]. Labs earlier this morning also have returned. Demonstrate progressive loss of kidney function and patient remains anuric. Patient likely to need dialysis within the next 24 hours, currently no hyperkalemia or significant acidosis. Does not currently have dialysis access. We'll look towards placing temporary catheter.   Pearson Grippe MD 06/19/2015, 10:30 AM   Recent Labs Lab 06/17/15 2235  06/18/15 0408  06/18/15 1258 06/18/15 1815 06/19/15 0910  NA 133*  < > 133*  < > 131* 127* 127*  K 6.3*  < > 5.0  < > 5.1 5.0 5.0  CL 95*  < > 98*  < > 95* 92* 86*  CO2 23  < > 23  < > 22 20* 19*  GLUCOSE 332*  < > 179*  < > 242* 251* 340*  BUN 64*  < > 66*  < > 72*  74* 91*  CREATININE 4.18*  < > 4.75*  < > 5.60* 6.05* 7.45*  CALCIUM 9.1  < > 8.9  < > 8.6* 8.1* 8.3*  PHOS 6.0*  --  5.7*  --   --   --  8.3*  < > = values in this interval not displayed.  Recent Labs Lab 06/17/15 2235 06/18/15 0408 06/19/15 0910  WBC 16.1* 12.9* 16.4*  HGB 12.1* 11.2* 11.9*  HCT 37.0* 34.4* 36.1*  MCV 85.5 85.4 83.2  PLT 196 208 233

## 2015-06-19 NOTE — H&P (View-Only) (Signed)
Reason for Consult: Abscess osteomyelitis gangrene left great toe and left second toe Referring Physician: Dr. Myrtie Cruise is an 74 y.o. male.  HPI: Patient is a 74 year old gentleman with type 2 diabetes not on dialysis who presents with acute history of black gangrenous changes of the left great toe and second toe.  Past Medical History  Diagnosis Date  . Diabetes mellitus without complication ([place])   . Hypertension   . DKA (diabetic ketoacidoses) ([place]) 06/18/2015  . AKI (acute kidney injury) ([place]) 06/18/2015  . Cancer ([place])     hx of prostate cancer    Past Surgical History  Procedure Laterality Date  . Prostate seeds      Family History  Problem Relation Age of Onset  . Diabetes Mother   . Diabetes Sister   . Diabetes Brother     Social History:  reports that he has never smoked. He has never used smokeless tobacco. He reports that he drinks alcohol. He reports that he does not use illicit drugs.  Allergies: No Known Allergies  Medications: I have reviewed the patient's current medications.  Results for orders placed or performed during the hospital encounter of 06/17/15 (from the past 48 hour(s))  Basic metabolic panel     Status: Abnormal   Collection Time: 06/17/15  5:08 PM  Result Value Ref Range   Sodium 129 (L) 135 - 145 mmol/L   Potassium 6.0 (H) 3.5 - 5.1 mmol/L   Chloride 95 (L) 101 - 111 mmol/L   CO2 18 (L) 22 - 32 mmol/L   Glucose, Bld 454 (H) 65 - 99 mg/dL   BUN 60 (H) 6 - 20 mg/dL   Creatinine, Ser 4.01 (H) 0.61 - 1.24 mg/dL   Calcium 8.8 (L) 8.9 - 10.3 mg/dL   GFR calc non Af Amer 14 (L) >60 mL/min   GFR calc Af Amer 16 (L) >60 mL/min    Comment: (NOTE) The eGFR has been calculated using the CKD EPI equation. This calculation has not been validated in all clinical situations. eGFR's persistently <60 mL/min signify possible Chronic Kidney Disease.    Anion gap 16 (H) 5 - 15  CBC     Status: Abnormal   Collection Time: 06/17/15  5:08 PM   Result Value Ref Range   WBC 16.2 (H) 4.0 - 10.5 K/uL   RBC 4.16 (L) 4.22 - 5.81 MIL/uL   Hemoglobin 11.4 (L) 13.0 - 17.0 g/dL   HCT 35.1 (L) 39.0 - 52.0 %   MCV 84.4 78.0 - 100.0 fL   MCH 27.4 26.0 - 34.0 pg   MCHC 32.5 30.0 - 36.0 g/dL   RDW 13.8 11.5 - 15.5 %   Platelets 208 150 - 400 K/uL  Troponin I     Status: Abnormal   Collection Time: 06/17/15  5:08 PM  Result Value Ref Range   Troponin I 0.05 (H) <0.031 ng/mL    Comment:        PERSISTENTLY INCREASED TROPONIN VALUES IN THE RANGE OF 0.04-0.49 ng/mL CAN BE SEEN IN:       -UNSTABLE ANGINA       -CONGESTIVE HEART FAILURE       -MYOCARDITIS       -CHEST TRAUMA       -ARRYHTHMIAS       -LATE PRESENTING MYOCARDIAL INFARCTION       -COPD   CLINICAL FOLLOW-UP RECOMMENDED.   Hepatic function panel     Status: Abnormal   Collection Time:  06/17/15  5:08 PM  Result Value Ref Range   Total Protein 6.6 6.5 - 8.1 g/dL   Albumin 2.9 (L) 3.5 - 5.0 g/dL   AST 73 (H) 15 - 41 U/L   ALT 29 17 - 63 U/L   Alkaline Phosphatase 91 38 - 126 U/L   Total Bilirubin 0.6 0.3 - 1.2 mg/dL   Bilirubin, Direct 0.3 0.1 - 0.5 mg/dL   Indirect Bilirubin 0.3 0.3 - 0.9 mg/dL  CK     Status: Abnormal   Collection Time: 06/17/15  5:08 PM  Result Value Ref Range   Total CK 3053 (H) 49 - 397 U/L  Sedimentation rate     Status: Abnormal   Collection Time: 06/17/15  5:08 PM  Result Value Ref Range   Sed Rate 53 (H) 0 - 16 mm/hr  CBG monitoring, ED     Status: Abnormal   Collection Time: 06/17/15  7:08 PM  Result Value Ref Range   Glucose-Capillary 316 (H) 65 - 99 mg/dL  Urinalysis, Routine w reflex microscopic (not at [facility])     Status: Abnormal   Collection Time: 06/17/15  9:20 PM  Result Value Ref Range   Color, Urine YELLOW YELLOW   APPearance CLOUDY (A) CLEAR   Specific Gravity, Urine 1.023 1.005 - 1.030   pH 5.0 5.0 - 8.0   Glucose, UA >1000 (A) NEGATIVE mg/dL   Hgb urine dipstick LARGE (A) NEGATIVE   Bilirubin Urine NEGATIVE NEGATIVE    Ketones, ur NEGATIVE NEGATIVE mg/dL   Protein, ur 100 (A) NEGATIVE mg/dL   Nitrite NEGATIVE NEGATIVE   Leukocytes, UA NEGATIVE NEGATIVE  Urine microscopic-add on     Status: Abnormal   Collection Time: 06/17/15  9:20 PM  Result Value Ref Range   Squamous Epithelial / LPF 0-5 (A) NONE SEEN   WBC, UA 0-5 0 - 5 WBC/hpf   RBC / HPF 6-30 0 - 5 RBC/hpf   Bacteria, UA FEW (A) NONE SEEN   Casts HYALINE CASTS (A) NEGATIVE  Glucose, capillary     Status: Abnormal   Collection Time: 06/17/15 10:13 PM  Result Value Ref Range   Glucose-Capillary 281 (H) 65 - 99 mg/dL  Magnesium     Status: None   Collection Time: 06/17/15 10:35 PM  Result Value Ref Range   Magnesium 2.2 1.7 - 2.4 mg/dL  Phosphorus     Status: Abnormal   Collection Time: 06/17/15 10:35 PM  Result Value Ref Range   Phosphorus 6.0 (H) 2.5 - 4.6 mg/dL  Troponin I (q 6hr x 3)     Status: Abnormal   Collection Time: 06/17/15 10:35 PM  Result Value Ref Range   Troponin I 0.19 (H) <0.031 ng/mL    Comment:        PERSISTENTLY INCREASED TROPONIN VALUES IN THE RANGE OF 0.04-0.49 ng/mL CAN BE SEEN IN:       -UNSTABLE ANGINA       -CONGESTIVE HEART FAILURE       -MYOCARDITIS       -CHEST TRAUMA       -ARRYHTHMIAS       -LATE PRESENTING MYOCARDIAL INFARCTION       -COPD   CLINICAL FOLLOW-UP RECOMMENDED.   Procalcitonin - Baseline     Status: None   Collection Time: 06/17/15 10:35 PM  Result Value Ref Range   Procalcitonin 35.32 ng/mL    Comment:        Interpretation: PCT >= 10 ng/mL: Important systemic inflammatory  response, almost exclusively due to severe bacterial sepsis or septic shock. (NOTE)         ICU PCT Algorithm               Non ICU PCT Algorithm    ----------------------------     ------------------------------         PCT < 0.25 ng/mL                 PCT < 0.1 ng/mL     Stopping of antibiotics            Stopping of antibiotics       strongly encouraged.               strongly encouraged.     ----------------------------     ------------------------------       PCT level decrease by               PCT < 0.25 ng/mL       >= 80% from peak PCT       OR PCT 0.25 - 0.5 ng/mL          Stopping of antibiotics                                             encouraged.     Stopping of antibiotics           encouraged.    ----------------------------     ------------------------------       PCT level decrease by              PCT >= 0.25 ng/mL       < 80% from peak PCT        AND PCT >= 0.5 ng/mL             Continuing antibiotics                                              encouraged.       Continuing antibiotics            encouraged.    ----------------------------     ------------------------------     PCT level increase compared          PCT > 0.5 ng/mL         with peak PCT AND          PCT >= 0.5 ng/mL             Escalation of antibiotics                                          strongly encouraged.      Escalation of antibiotics        strongly encouraged.   CBC     Status: Abnormal   Collection Time: 06/17/15 10:35 PM  Result Value Ref Range   WBC 16.1 (H) 4.0 - 10.5 K/uL   RBC 4.33 4.22 - 5.81 MIL/uL   Hemoglobin 12.1 (L) 13.0 - 17.0 g/dL   HCT 37.0 (L) 39.0 - 52.0 %   MCV 85.5 78.0 - 100.0 fL   MCH 27.9  26.0 - 34.0 pg   MCHC 32.7 30.0 - 36.0 g/dL   RDW 13.9 11.5 - 15.5 %   Platelets 196 150 - 400 K/uL  Basic metabolic panel     Status: Abnormal   Collection Time: 06/17/15 10:35 PM  Result Value Ref Range   Sodium 133 (L) 135 - 145 mmol/L   Potassium 6.3 (HH) 3.5 - 5.1 mmol/L    Comment: CRITICAL RESULT CALLED TO, READ BACK BY AND VERIFIED WITH: QIU,R RN 06/17/2015 [address] NO VISIBLE HEMOLYSIS    Chloride 95 (L) 101 - 111 mmol/L   CO2 23 22 - 32 mmol/L   Glucose, Bld 332 (H) 65 - 99 mg/dL   BUN 64 (H) 6 - 20 mg/dL   Creatinine, Ser 4.18 (H) 0.61 - 1.24 mg/dL   Calcium 9.1 8.9 - 10.3 mg/dL   GFR calc non Af Amer 13 (L) >60 mL/min   GFR calc Af Amer 15  (L) >60 mL/min    Comment: (NOTE) The eGFR has been calculated using the CKD EPI equation. This calculation has not been validated in all clinical situations. eGFR's persistently <60 mL/min signify possible Chronic Kidney Disease.    Anion gap 15 5 - 15  CK     Status: Abnormal   Collection Time: 06/17/15 10:35 PM  Result Value Ref Range   Total CK 2960 (H) 49 - 397 U/L  Glucose, capillary     Status: Abnormal   Collection Time: 06/17/15 11:47 PM  Result Value Ref Range   Glucose-Capillary 293 (H) 65 - 99 mg/dL  Lactic acid, plasma     Status: None   Collection Time: 06/18/15 12:01 AM  Result Value Ref Range   Lactic Acid, Venous 1.7 0.5 - 2.0 mmol/L  Glucose, capillary     Status: Abnormal   Collection Time: 06/18/15 12:37 AM  Result Value Ref Range   Glucose-Capillary 393 (H) 65 - 99 mg/dL  Creatinine, urine, random     Status: None   Collection Time: 06/18/15  1:16 AM  Result Value Ref Range   Creatinine, Urine 217.96 mg/dL  Sodium, urine, random     Status: None   Collection Time: 06/18/15  1:16 AM  Result Value Ref Range   Sodium, Ur <10 mmol/L  Urine rapid drug screen (hosp performed)     Status: None   Collection Time: 06/18/15  1:17 AM  Result Value Ref Range   Opiates NONE DETECTED NONE DETECTED   Cocaine NONE DETECTED NONE DETECTED   Benzodiazepines NONE DETECTED NONE DETECTED   Amphetamines NONE DETECTED NONE DETECTED   Tetrahydrocannabinol NONE DETECTED NONE DETECTED   Barbiturates NONE DETECTED NONE DETECTED    Comment:        DRUG SCREEN FOR MEDICAL PURPOSES ONLY.  IF CONFIRMATION IS NEEDED FOR ANY PURPOSE, NOTIFY LAB WITHIN 5 DAYS.        LOWEST DETECTABLE LIMITS FOR URINE DRUG SCREEN Drug Class       Cutoff (ng/mL) Amphetamine      1000 Barbiturate      200 Benzodiazepine   623 Tricyclics       762 Opiates          300 Cocaine          300 THC              50   Glucose, capillary     Status: Abnormal   Collection Time: 06/18/15  1:46 AM   Result Value Ref Range  Glucose-Capillary 293 (H) 65 - 99 mg/dL  TSH     Status: None   Collection Time: 06/18/15  2:30 AM  Result Value Ref Range   TSH 0.839 0.350 - 4.500 uIU/mL  Basic metabolic panel     Status: Abnormal   Collection Time: 06/18/15  2:30 AM  Result Value Ref Range   Sodium 132 (L) 135 - 145 mmol/L   Potassium 4.7 3.5 - 5.1 mmol/L    Comment: DELTA CHECK NOTED NO VISIBLE HEMOLYSIS    Chloride 98 (L) 101 - 111 mmol/L   CO2 23 22 - 32 mmol/L   Glucose, Bld 266 (H) 65 - 99 mg/dL   BUN 65 (H) 6 - 20 mg/dL   Creatinine, Ser 4.64 (H) 0.61 - 1.24 mg/dL   Calcium 8.9 8.9 - 10.3 mg/dL   GFR calc non Af Amer 11 (L) >60 mL/min   GFR calc Af Amer 13 (L) >60 mL/min    Comment: (NOTE) The eGFR has been calculated using the CKD EPI equation. This calculation has not been validated in all clinical situations. eGFR's persistently <60 mL/min signify possible Chronic Kidney Disease.    Anion gap 11 5 - 15  Glucose, capillary     Status: Abnormal   Collection Time: 06/18/15  2:43 AM  Result Value Ref Range   Glucose-Capillary 229 (H) 65 - 99 mg/dL  Glucose, capillary     Status: Abnormal   Collection Time: 06/18/15  3:44 AM  Result Value Ref Range   Glucose-Capillary 174 (H) 65 - 99 mg/dL  CK     Status: Abnormal   Collection Time: 06/18/15  4:08 AM  Result Value Ref Range   Total CK 2284 (H) 49 - 397 U/L  Troponin I (q 6hr x 3)     Status: Abnormal   Collection Time: 06/18/15  4:08 AM  Result Value Ref Range   Troponin I 0.34 (H) <0.031 ng/mL    Comment:        PERSISTENTLY INCREASED TROPONIN VALUES IN THE RANGE OF 0.04-0.49 ng/mL CAN BE SEEN IN:       -UNSTABLE ANGINA       -CONGESTIVE HEART FAILURE       -MYOCARDITIS       -CHEST TRAUMA       -ARRYHTHMIAS       -LATE PRESENTING MYOCARDIAL INFARCTION       -COPD   CLINICAL FOLLOW-UP RECOMMENDED.   Prealbumin     Status: Abnormal   Collection Time: 06/18/15  4:08 AM  Result Value Ref Range   Prealbumin  10.0 (L) 18 - 38 mg/dL  Magnesium     Status: None   Collection Time: 06/18/15  4:08 AM  Result Value Ref Range   Magnesium 2.2 1.7 - 2.4 mg/dL  Phosphorus     Status: Abnormal   Collection Time: 06/18/15  4:08 AM  Result Value Ref Range   Phosphorus 5.7 (H) 2.5 - 4.6 mg/dL  Comprehensive metabolic panel     Status: Abnormal   Collection Time: 06/18/15  4:08 AM  Result Value Ref Range   Sodium 133 (L) 135 - 145 mmol/L   Potassium 5.0 3.5 - 5.1 mmol/L   Chloride 98 (L) 101 - 111 mmol/L   CO2 23 22 - 32 mmol/L   Glucose, Bld 179 (H) 65 - 99 mg/dL   BUN 66 (H) 6 - 20 mg/dL   Creatinine, Ser 4.75 (H) 0.61 - 1.24 mg/dL   Calcium 8.9 8.9 -  10.3 mg/dL   Total Protein 5.8 (L) 6.5 - 8.1 g/dL   Albumin 2.6 (L) 3.5 - 5.0 g/dL   AST 68 (H) 15 - 41 U/L   ALT 28 17 - 63 U/L   Alkaline Phosphatase 91 38 - 126 U/L   Total Bilirubin 0.5 0.3 - 1.2 mg/dL   GFR calc non Af Amer 11 (L) >60 mL/min   GFR calc Af Amer 13 (L) >60 mL/min    Comment: (NOTE) The eGFR has been calculated using the CKD EPI equation. This calculation has not been validated in all clinical situations. eGFR's persistently <60 mL/min signify possible Chronic Kidney Disease.    Anion gap 12 5 - 15  CBC     Status: Abnormal   Collection Time: 06/18/15  4:08 AM  Result Value Ref Range   WBC 12.9 (H) 4.0 - 10.5 K/uL   RBC 4.03 (L) 4.22 - 5.81 MIL/uL   Hemoglobin 11.2 (L) 13.0 - 17.0 g/dL   HCT 34.4 (L) 39.0 - 52.0 %   MCV 85.4 78.0 - 100.0 fL   MCH 27.8 26.0 - 34.0 pg   MCHC 32.6 30.0 - 36.0 g/dL   RDW 14.0 11.5 - 15.5 %   Platelets 208 150 - 400 K/uL  Glucose, capillary     Status: Abnormal   Collection Time: 06/18/15  4:49 AM  Result Value Ref Range   Glucose-Capillary 129 (H) 65 - 99 mg/dL  Glucose, capillary     Status: Abnormal   Collection Time: 06/18/15  5:47 AM  Result Value Ref Range   Glucose-Capillary 126 (H) 65 - 99 mg/dL  Basic metabolic panel     Status: Abnormal   Collection Time: 06/18/15  6:35 AM   Result Value Ref Range   Sodium 130 (L) 135 - 145 mmol/L   Potassium 4.7 3.5 - 5.1 mmol/L   Chloride 98 (L) 101 - 111 mmol/L   CO2 21 (L) 22 - 32 mmol/L   Glucose, Bld 154 (H) 65 - 99 mg/dL   BUN 67 (H) 6 - 20 mg/dL   Creatinine, Ser 4.94 (H) 0.61 - 1.24 mg/dL   Calcium 8.5 (L) 8.9 - 10.3 mg/dL   GFR calc non Af Amer 11 (L) >60 mL/min   GFR calc Af Amer 12 (L) >60 mL/min    Comment: (NOTE) The eGFR has been calculated using the CKD EPI equation. This calculation has not been validated in all clinical situations. eGFR's persistently <60 mL/min signify possible Chronic Kidney Disease.    Anion gap 11 5 - 15  Glucose, capillary     Status: Abnormal   Collection Time: 06/18/15  6:53 AM  Result Value Ref Range   Glucose-Capillary 156 (H) 65 - 99 mg/dL  Troponin I (q 6hr x 3)     Status: Abnormal   Collection Time: 06/18/15  7:56 AM  Result Value Ref Range   Troponin I 0.06 (H) <0.031 ng/mL    Comment:        PERSISTENTLY INCREASED TROPONIN VALUES IN THE RANGE OF 0.04-0.49 ng/mL CAN BE SEEN IN:       -UNSTABLE ANGINA       -CONGESTIVE HEART FAILURE       -MYOCARDITIS       -CHEST TRAUMA       -ARRYHTHMIAS       -LATE PRESENTING MYOCARDIAL INFARCTION       -COPD   CLINICAL FOLLOW-UP RECOMMENDED.   Glucose, capillary     Status:  Abnormal   Collection Time: 06/18/15  7:57 AM  Result Value Ref Range   Glucose-Capillary 178 (H) 65 - 99 mg/dL  Glucose, capillary     Status: Abnormal   Collection Time: 06/18/15 10:57 AM  Result Value Ref Range   Glucose-Capillary 215 (H) 65 - 99 mg/dL  Basic metabolic panel     Status: Abnormal   Collection Time: 06/18/15 11:00 AM  Result Value Ref Range   Sodium 131 (L) 135 - 145 mmol/L   Potassium 4.9 3.5 - 5.1 mmol/L   Chloride 97 (L) 101 - 111 mmol/L   CO2 19 (L) 22 - 32 mmol/L   Glucose, Bld 233 (H) 65 - 99 mg/dL   BUN 70 (H) 6 - 20 mg/dL   Creatinine, Ser 5.32 (H) 0.61 - 1.24 mg/dL   Calcium 8.7 (L) 8.9 - 10.3 mg/dL   GFR calc non  Af Amer 10 (L) >60 mL/min   GFR calc Af Amer 11 (L) >60 mL/min    Comment: (NOTE) The eGFR has been calculated using the CKD EPI equation. This calculation has not been validated in all clinical situations. eGFR's persistently <60 mL/min signify possible Chronic Kidney Disease.    Anion gap 15 5 - 15  Basic metabolic panel     Status: Abnormal   Collection Time: 06/18/15 12:58 PM  Result Value Ref Range   Sodium 131 (L) 135 - 145 mmol/L   Potassium 5.1 3.5 - 5.1 mmol/L   Chloride 95 (L) 101 - 111 mmol/L   CO2 22 22 - 32 mmol/L   Glucose, Bld 242 (H) 65 - 99 mg/dL   BUN 72 (H) 6 - 20 mg/dL   Creatinine, Ser 5.60 (H) 0.61 - 1.24 mg/dL   Calcium 8.6 (L) 8.9 - 10.3 mg/dL   GFR calc non Af Amer 9 (L) >60 mL/min   GFR calc Af Amer 10 (L) >60 mL/min    Comment: (NOTE) The eGFR has been calculated using the CKD EPI equation. This calculation has not been validated in all clinical situations. eGFR's persistently <60 mL/min signify possible Chronic Kidney Disease.    Anion gap 14 5 - 15  CK     Status: Abnormal   Collection Time: 06/18/15 12:58 PM  Result Value Ref Range   Total CK 1983 (H) 49 - 397 U/L  Troponin I (q 6hr x 3)     Status: Abnormal   Collection Time: 06/18/15 12:58 PM  Result Value Ref Range   Troponin I 0.12 (H) <0.031 ng/mL    Comment:        PERSISTENTLY INCREASED TROPONIN VALUES IN THE RANGE OF 0.04-0.49 ng/mL CAN BE SEEN IN:       -UNSTABLE ANGINA       -CONGESTIVE HEART FAILURE       -MYOCARDITIS       -CHEST TRAUMA       -ARRYHTHMIAS       -LATE PRESENTING MYOCARDIAL INFARCTION       -COPD   CLINICAL FOLLOW-UP RECOMMENDED.   Protein / creatinine ratio, urine     Status: Abnormal   Collection Time: 06/18/15  4:10 PM  Result Value Ref Range   Creatinine, Urine 185.57 mg/dL   Total Protein, Urine 310 mg/dL    Comment: RESULTS CONFIRMED BY MANUAL DILUTION NO NORMAL RANGE ESTABLISHED FOR THIS TEST    Protein Creatinine Ratio 1.67 (H) 0.00 - 0.15  mg/mg[Cre]  Glucose, capillary     Status: Abnormal   Collection  Time: 06/18/15  5:11 PM  Result Value Ref Range   Glucose-Capillary 223 (H) 65 - 99 mg/dL    Dg Chest 2 View  06/17/2015  CLINICAL DATA:  Weakness. EXAM: CHEST  2 VIEW COMPARISON:  None. FINDINGS: Trachea is midline. Heart size within normal limits. Lungs are clear. No pleural fluid. Degenerative changes are seen in the spine. IMPRESSION: No acute findings. Electronically Signed   By: Lorin Picket M.D.   On: 06/17/2015 18:10   Dg Abd 1 View  06/17/2015  CLINICAL DATA:  Abdominal distention EXAM: ABDOMEN - 1 VIEW COMPARISON:  None. FINDINGS: Brachytherapy seeds overlie the prostate. There mildly dilated small bowel loops in the central abdomen measuring up to 3.8 cm in diameter. Mild stool is seen throughout the colon. No evidence of pneumatosis or pneumoperitoneum. No pathologic soft tissue calcifications. IMPRESSION: Mildly dilated small bowel loops in the central abdomen, for which the differential includes a mid to distal small bowel obstruction or adynamic ileus. Electronically Signed   By: Ilona Sorrel M.D.   On: 06/17/2015 21:12   US Renal  06/18/2015  CLINICAL DATA:  Acute onset of renal failure.  Initial encounter. EXAM: RENAL / URINARY TRACT ULTRASOUND COMPLETE COMPARISON:  None. FINDINGS: Right Kidney: Length: 10.2 cm. Increased parenchymal echogenicity noted. A small 1.1 cm cyst is noted at the interpole region of the right kidney. No hydronephrosis visualized. Left Kidney: Length: 11.9 cm. Mildly increased parenchymal echogenicity noted. No mass or hydronephrosis visualized. Bladder: Decompressed and not well assessed. IMPRESSION: 1. No evidence of hydronephrosis. 2. Increased renal parenchymal echogenicity likely reflects medical renal disease. 3. Small right renal cyst noted. Electronically Signed   By: Garald Balding M.D.   On: 06/18/2015 00:41   Dg Shoulder Right Port  06/18/2015  CLINICAL DATA:  Patient with  diffuse right shoulder pain. No known injury. EXAM: PORTABLE RIGHT SHOULDER - 2+ VIEW COMPARISON:  Chest radiograph 06/17/2015 FINDINGS: Limited evaluation due to inability to position patient. No evidence for displaced fracture. AC joint degenerative changes. Visualized right hemi thorax is unremarkable. IMPRESSION: Limited exam due to difficulty with patient positioning. Within the above limitation, no evidence for displaced fracture. Recommend evaluation with standard radiographic views when patient clinically able. Electronically Signed   By: Lovey Newcomer M.D.   On: 06/18/2015 13:28   Dg Foot Complete Left  06/17/2015  CLINICAL DATA:  Unable to walk.  Foot pain.  Diabetes. EXAM: LEFT FOOT - COMPLETE 3+ VIEW COMPARISON:  None. FINDINGS: Questionable soft tissue ulceration at the tips of the first and second toes. 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 No visible radiographic changes of acute osteomyelitis. No fracture, subluxation or dislocation. IMPRESSION: No radiographic changes of osteomyelitis. Electronically Signed   By: Rolm Baptise M.D.   On: 06/17/2015 18:11    Review of Systems  All other systems reviewed and are negative.  Blood pressure 95/64, pulse 102, temperature 99.3 F (37.4 C), temperature source Oral, resp. rate 20, height _0  (1.88 m), weight 97.977 kg (216 lb), SpO2 92 %. Physical Exam On examination patient has no ulcers in his leg he has a good dorsalis pedis pulse he has black gangrenous changes of the great toe and second toe with exposed bone of the great toe and second toe. The ulceration extends to the webspace. Radiographs do not show destructive bony changes however patient does have exposed bone of the great toe and second toe. Assessment/Plan: Assessment: Osteomyelitis ulceration and gangrene  left foot second and great toe with diabetic insensate neuropathy with good pulses.  Plan: We'll try 4 of foot salvage with amputation of the  first and second Ray. Risk and benefits were discussed including risk of the wound not healing. Risk of a higher level amputation. Patient states he understands wish to proceed at this time plan for surgery Friday afternoon.  DUDA,MARCUS V 06/18/2015, 6:13 PM

## 2015-06-19 NOTE — Care Management Important Message (Signed)
Important Message  Patient Details  Name: David Ford MRN: [id number] Date of Birth: 1940/07/13   Medicare Important Message Given:  Yes    Barb Merino [place] 06/19/2015, 2:59 PM

## 2015-06-19 NOTE — Interval H&P Note (Signed)
History and Physical Interval Note:  06/19/2015 6:36 AM  David Ford  has presented today for surgery, with the diagnosis of Gangrene Left Great Toe and Second Toe  The various methods of treatment have been discussed with the patient and family. After consideration of risks, benefits and other options for treatment, the patient has consented to  Procedure(s): AMPUTATION RAY (Left) as a surgical intervention .  The patient's history has been reviewed, patient examined, no change in status, stable for surgery.  I have reviewed the patient's chart and labs.  Questions were answered to the patient's satisfaction.     [name]

## 2015-06-19 NOTE — Progress Notes (Signed)
Pt is able to answer questions appropriately this am. He is able to verbalize that he is at [facility], his date of birth, the month and year. However pt is only able to verbalize that the doctor "is going to do surgery today" but is unable to verbalize that this may consists of an amputation and request further explanation before signing his consent. Prepared consent placed in chart to be signed once pt is clear on today's procedure.  Pt also noted to have no output per foley catheter. Obtained bladder scan which shows 66 ml to bladder. Abdomen appears distended on visual assessment. Foley catheter is not clamped with no dependent loops noted. Pt denies discomfort. Pt fluid orders changed from Sodium Bicarb to Half NS pre-procedural for surgery today. Question to restart the bicarb and hold the half normal at this time secondary to pt renal function. Page to K. Schorr for notification with call back. Ok to leave on sodium bicarb at this time. Dorthey Sawyer, RN

## 2015-06-19 NOTE — Progress Notes (Signed)
CT results:  Partial SBO localized to left mid-abdomen with partial malrotation of the small bowel.  He also had abnormal appearance of the cecum with stranding, suspicious for neoplasm vs. Colitis and a cirrhotic appearing liver.  Notified general surgery of findings but hopefully pSBO with resolve with conservative measures.  Will add ammonia level to morning labs.  Cirrhosis with portal hypertensive gastropathy may also be contributing to patient's hematemesis.

## 2015-06-19 NOTE — Consult Note (Signed)
Reason for Consult: Coffee-ground in NG tube Referring Physician: Hospital team  David Ford is an 74 y.o. male.  HPI: Patient seen and examined and hospital computer chart was reviewed and he denies any previous GI problems although he believes he had 2 colonoscopies in the past he is not sure if they were here or in [place] but supposedly they were okay and he has not had any upper tract workup and he has not had a bowel movement since he's been here but has not seen any blood or black stools and his bowels and they have been normal prior to getting sick and he denies any upper tract symptoms heartburn swallowing problems or abdominal pain and he denies any aspirin or nonsteroidals or blood thinners at home and his family history is negative from a GI standpoint but he has been losing weight because he's been watching his diabetic diet he says and has no other complaints  Past Medical History  Diagnosis Date  . Diabetes mellitus without complication ([place])   . Hypertension   . DKA (diabetic ketoacidoses) ([place]) 06/18/2015  . AKI (acute kidney injury) ([place]) 06/18/2015  . Cancer ([place])     hx of prostate cancer    Past Surgical History  Procedure Laterality Date  . Prostate seeds      Family History  Problem Relation Age of Onset  . Diabetes Mother   . Diabetes Sister   . Diabetes Brother     Social History:  reports that he has never smoked. He has never used smokeless tobacco. He reports that he drinks alcohol. He reports that he does not use illicit drugs.  Allergies: No Known Allergies  Medications: I have reviewed the patient's current medications.  Results for orders placed or performed during the hospital encounter of 06/17/15 (from the past 48 hour(s))  Basic metabolic panel     Status: Abnormal   Collection Time: 06/17/15  5:08 PM  Result Value Ref Range   Sodium 129 (L) 135 - 145 mmol/L   Potassium 6.0 (H) 3.5 - 5.1 mmol/L   Chloride 95 (L) 101 - 111 mmol/L   CO2 18  (L) 22 - 32 mmol/L   Glucose, Bld 454 (H) 65 - 99 mg/dL   BUN 60 (H) 6 - 20 mg/dL   Creatinine, Ser 4.01 (H) 0.61 - 1.24 mg/dL   Calcium 8.8 (L) 8.9 - 10.3 mg/dL   GFR calc non Af Amer 14 (L) >60 mL/min   GFR calc Af Amer 16 (L) >60 mL/min    Comment: (NOTE) The eGFR has been calculated using the CKD EPI equation. This calculation has not been validated in all clinical situations. eGFR's persistently <60 mL/min signify possible Chronic Kidney Disease.    Anion gap 16 (H) 5 - 15  CBC     Status: Abnormal   Collection Time: 06/17/15  5:08 PM  Result Value Ref Range   WBC 16.2 (H) 4.0 - 10.5 K/uL   RBC 4.16 (L) 4.22 - 5.81 MIL/uL   Hemoglobin 11.4 (L) 13.0 - 17.0 g/dL   HCT 35.1 (L) 39.0 - 52.0 %   MCV 84.4 78.0 - 100.0 fL   MCH 27.4 26.0 - 34.0 pg   MCHC 32.5 30.0 - 36.0 g/dL   RDW 13.8 11.5 - 15.5 %   Platelets 208 150 - 400 K/uL  Troponin I     Status: Abnormal   Collection Time: 06/17/15  5:08 PM  Result Value Ref Range   Troponin I  0.05 (H) <0.031 ng/mL    Comment:        PERSISTENTLY INCREASED TROPONIN VALUES IN THE RANGE OF 0.04-0.49 ng/mL CAN BE SEEN IN:       -UNSTABLE ANGINA       -CONGESTIVE HEART FAILURE       -MYOCARDITIS       -CHEST TRAUMA       -ARRYHTHMIAS       -LATE PRESENTING MYOCARDIAL INFARCTION       -COPD   CLINICAL FOLLOW-UP RECOMMENDED.   Hepatic function panel     Status: Abnormal   Collection Time: 06/17/15  5:08 PM  Result Value Ref Range   Total Protein 6.6 6.5 - 8.1 g/dL   Albumin 2.9 (L) 3.5 - 5.0 g/dL   AST 73 (H) 15 - 41 U/L   ALT 29 17 - 63 U/L   Alkaline Phosphatase 91 38 - 126 U/L   Total Bilirubin 0.6 0.3 - 1.2 mg/dL   Bilirubin, Direct 0.3 0.1 - 0.5 mg/dL   Indirect Bilirubin 0.3 0.3 - 0.9 mg/dL  CK     Status: Abnormal   Collection Time: 06/17/15  5:08 PM  Result Value Ref Range   Total CK 3053 (H) 49 - 397 U/L  Sedimentation rate     Status: Abnormal   Collection Time: 06/17/15  5:08 PM  Result Value Ref Range   Sed  Rate 53 (H) 0 - 16 mm/hr  CBG monitoring, ED     Status: Abnormal   Collection Time: 06/17/15  7:08 PM  Result Value Ref Range   Glucose-Capillary 316 (H) 65 - 99 mg/dL  Urinalysis, Routine w reflex microscopic (not at [facility])     Status: Abnormal   Collection Time: 06/17/15  9:20 PM  Result Value Ref Range   Color, Urine YELLOW YELLOW   APPearance CLOUDY (A) CLEAR   Specific Gravity, Urine 1.023 1.005 - 1.030   pH 5.0 5.0 - 8.0   Glucose, UA >1000 (A) NEGATIVE mg/dL   Hgb urine dipstick LARGE (A) NEGATIVE   Bilirubin Urine NEGATIVE NEGATIVE   Ketones, ur NEGATIVE NEGATIVE mg/dL   Protein, ur 100 (A) NEGATIVE mg/dL   Nitrite NEGATIVE NEGATIVE   Leukocytes, UA NEGATIVE NEGATIVE  Urine microscopic-add on     Status: Abnormal   Collection Time: 06/17/15  9:20 PM  Result Value Ref Range   Squamous Epithelial / LPF 0-5 (A) NONE SEEN   WBC, UA 0-5 0 - 5 WBC/hpf   RBC / HPF 6-30 0 - 5 RBC/hpf   Bacteria, UA FEW (A) NONE SEEN   Casts HYALINE CASTS (A) NEGATIVE  Glucose, capillary     Status: Abnormal   Collection Time: 06/17/15 10:13 PM  Result Value Ref Range   Glucose-Capillary 281 (H) 65 - 99 mg/dL  Magnesium     Status: None   Collection Time: 06/17/15 10:35 PM  Result Value Ref Range   Magnesium 2.2 1.7 - 2.4 mg/dL  Phosphorus     Status: Abnormal   Collection Time: 06/17/15 10:35 PM  Result Value Ref Range   Phosphorus 6.0 (H) 2.5 - 4.6 mg/dL  Troponin I (q 6hr x 3)     Status: Abnormal   Collection Time: 06/17/15 10:35 PM  Result Value Ref Range   Troponin I 0.19 (H) <0.031 ng/mL    Comment:        PERSISTENTLY INCREASED TROPONIN VALUES IN THE RANGE OF 0.04-0.49 ng/mL CAN BE SEEN IN:       -  UNSTABLE ANGINA       -CONGESTIVE HEART FAILURE       -MYOCARDITIS       -CHEST TRAUMA       -ARRYHTHMIAS       -LATE PRESENTING MYOCARDIAL INFARCTION       -COPD   CLINICAL FOLLOW-UP RECOMMENDED.   Procalcitonin - Baseline     Status: None   Collection Time: 06/17/15 10:35 PM   Result Value Ref Range   Procalcitonin 35.32 ng/mL    Comment:        Interpretation: PCT >= 10 ng/mL: Important systemic inflammatory response, almost exclusively due to severe bacterial sepsis or septic shock. (NOTE)         ICU PCT Algorithm               Non ICU PCT Algorithm    ----------------------------     ------------------------------         PCT < 0.25 ng/mL                 PCT < 0.1 ng/mL     Stopping of antibiotics            Stopping of antibiotics       strongly encouraged.               strongly encouraged.    ----------------------------     ------------------------------       PCT level decrease by               PCT < 0.25 ng/mL       >= 80% from peak PCT       OR PCT 0.25 - 0.5 ng/mL          Stopping of antibiotics                                             encouraged.     Stopping of antibiotics           encouraged.    ----------------------------     ------------------------------       PCT level decrease by              PCT >= 0.25 ng/mL       < 80% from peak PCT        AND PCT >= 0.5 ng/mL             Continuing antibiotics                                              encouraged.       Continuing antibiotics            encouraged.    ----------------------------     ------------------------------     PCT level increase compared          PCT > 0.5 ng/mL         with peak PCT AND          PCT >= 0.5 ng/mL             Escalation of antibiotics  strongly encouraged.      Escalation of antibiotics        strongly encouraged.   CBC     Status: Abnormal   Collection Time: 06/17/15 10:35 PM  Result Value Ref Range   WBC 16.1 (H) 4.0 - 10.5 K/uL   RBC 4.33 4.22 - 5.81 MIL/uL   Hemoglobin 12.1 (L) 13.0 - 17.0 g/dL   HCT 37.0 (L) 39.0 - 52.0 %   MCV 85.5 78.0 - 100.0 fL   MCH 27.9 26.0 - 34.0 pg   MCHC 32.7 30.0 - 36.0 g/dL   RDW 13.9 11.5 - 15.5 %   Platelets 196 150 - 400 K/uL  Basic metabolic panel      Status: Abnormal   Collection Time: 06/17/15 10:35 PM  Result Value Ref Range   Sodium 133 (L) 135 - 145 mmol/L   Potassium 6.3 (HH) 3.5 - 5.1 mmol/L    Comment: CRITICAL RESULT CALLED TO, READ BACK BY AND VERIFIED WITH: QIU,R RN 06/17/2015 [address] NO VISIBLE HEMOLYSIS    Chloride 95 (L) 101 - 111 mmol/L   CO2 23 22 - 32 mmol/L   Glucose, Bld 332 (H) 65 - 99 mg/dL   BUN 64 (H) 6 - 20 mg/dL   Creatinine, Ser 4.18 (H) 0.61 - 1.24 mg/dL   Calcium 9.1 8.9 - 10.3 mg/dL   GFR calc non Af Amer 13 (L) >60 mL/min   GFR calc Af Amer 15 (L) >60 mL/min    Comment: (NOTE) The eGFR has been calculated using the CKD EPI equation. This calculation has not been validated in all clinical situations. eGFR's persistently <60 mL/min signify possible Chronic Kidney Disease.    Anion gap 15 5 - 15  CK     Status: Abnormal   Collection Time: 06/17/15 10:35 PM  Result Value Ref Range   Total CK 2960 (H) 49 - 397 U/L  Glucose, capillary     Status: Abnormal   Collection Time: 06/17/15 11:47 PM  Result Value Ref Range   Glucose-Capillary 293 (H) 65 - 99 mg/dL  Lactic acid, plasma     Status: None   Collection Time: 06/18/15 12:01 AM  Result Value Ref Range   Lactic Acid, Venous 1.7 0.5 - 2.0 mmol/L  Glucose, capillary     Status: Abnormal   Collection Time: 06/18/15 12:37 AM  Result Value Ref Range   Glucose-Capillary 393 (H) 65 - 99 mg/dL  Creatinine, urine, random     Status: None   Collection Time: 06/18/15  1:16 AM  Result Value Ref Range   Creatinine, Urine 217.96 mg/dL  Sodium, urine, random     Status: None   Collection Time: 06/18/15  1:16 AM  Result Value Ref Range   Sodium, Ur <10 mmol/L  Urine rapid drug screen (hosp performed)     Status: None   Collection Time: 06/18/15  1:17 AM  Result Value Ref Range   Opiates NONE DETECTED NONE DETECTED   Cocaine NONE DETECTED NONE DETECTED   Benzodiazepines NONE DETECTED NONE DETECTED   Amphetamines NONE DETECTED NONE DETECTED    Tetrahydrocannabinol NONE DETECTED NONE DETECTED   Barbiturates NONE DETECTED NONE DETECTED    Comment:        DRUG SCREEN FOR MEDICAL PURPOSES ONLY.  IF CONFIRMATION IS NEEDED FOR ANY PURPOSE, NOTIFY LAB WITHIN 5 DAYS.        LOWEST DETECTABLE LIMITS FOR URINE DRUG SCREEN Drug Class       Cutoff (ng/mL)  Amphetamine      1000 Barbiturate      200 Benzodiazepine   237 Tricyclics       628 Opiates          300 Cocaine          300 THC              50   Urine culture     Status: None   Collection Time: 06/18/15  1:18 AM  Result Value Ref Range   Specimen Description URINE, CATHETERIZED    Special Requests NONE    Culture NO GROWTH 1 DAY    Report Status 06/19/2015 FINAL   Glucose, capillary     Status: Abnormal   Collection Time: 06/18/15  1:46 AM  Result Value Ref Range   Glucose-Capillary 293 (H) 65 - 99 mg/dL  Hemoglobin A1c     Status: Abnormal   Collection Time: 06/18/15  2:30 AM  Result Value Ref Range   Hgb A1c MFr Bld 14.2 (H) 4.8 - 5.6 %    Comment: (NOTE)         Pre-diabetes: 5.7 - 6.4         Diabetes: >6.4         Glycemic control for adults with diabetes: <7.0    Mean Plasma Glucose 361 mg/dL    Comment: (NOTE) Performed At: [facility] [place], [place] [id number] Lindon Romp MD VP:[id number]   TSH     Status: None   Collection Time: 06/18/15  2:30 AM  Result Value Ref Range   TSH 0.839 0.350 - 4.500 uIU/mL  Basic metabolic panel     Status: Abnormal   Collection Time: 06/18/15  2:30 AM  Result Value Ref Range   Sodium 132 (L) 135 - 145 mmol/L   Potassium 4.7 3.5 - 5.1 mmol/L    Comment: DELTA CHECK NOTED NO VISIBLE HEMOLYSIS    Chloride 98 (L) 101 - 111 mmol/L   CO2 23 22 - 32 mmol/L   Glucose, Bld 266 (H) 65 - 99 mg/dL   BUN 65 (H) 6 - 20 mg/dL   Creatinine, Ser 4.64 (H) 0.61 - 1.24 mg/dL   Calcium 8.9 8.9 - 10.3 mg/dL   GFR calc non Af Amer 11 (L) >60 mL/min   GFR calc Af Amer 13 (L) >60 mL/min    Comment:  (NOTE) The eGFR has been calculated using the CKD EPI equation. This calculation has not been validated in all clinical situations. eGFR's persistently <60 mL/min signify possible Chronic Kidney Disease.    Anion gap 11 5 - 15  Glucose, capillary     Status: Abnormal   Collection Time: 06/18/15  2:43 AM  Result Value Ref Range   Glucose-Capillary 229 (H) 65 - 99 mg/dL  Glucose, capillary     Status: Abnormal   Collection Time: 06/18/15  3:44 AM  Result Value Ref Range   Glucose-Capillary 174 (H) 65 - 99 mg/dL  CK     Status: Abnormal   Collection Time: 06/18/15  4:08 AM  Result Value Ref Range   Total CK 2284 (H) 49 - 397 U/L  Troponin I (q 6hr x 3)     Status: Abnormal   Collection Time: 06/18/15  4:08 AM  Result Value Ref Range   Troponin I 0.34 (H) <0.031 ng/mL    Comment:        PERSISTENTLY INCREASED TROPONIN VALUES IN THE RANGE OF 0.04-0.49 ng/mL CAN BE  SEEN IN:       -UNSTABLE ANGINA       -CONGESTIVE HEART FAILURE       -MYOCARDITIS       -CHEST TRAUMA       -ARRYHTHMIAS       -LATE PRESENTING MYOCARDIAL INFARCTION       -COPD   CLINICAL FOLLOW-UP RECOMMENDED.   Prealbumin     Status: Abnormal   Collection Time: 06/18/15  4:08 AM  Result Value Ref Range   Prealbumin 10.0 (L) 18 - 38 mg/dL  Magnesium     Status: None   Collection Time: 06/18/15  4:08 AM  Result Value Ref Range   Magnesium 2.2 1.7 - 2.4 mg/dL  Phosphorus     Status: Abnormal   Collection Time: 06/18/15  4:08 AM  Result Value Ref Range   Phosphorus 5.7 (H) 2.5 - 4.6 mg/dL  Comprehensive metabolic panel     Status: Abnormal   Collection Time: 06/18/15  4:08 AM  Result Value Ref Range   Sodium 133 (L) 135 - 145 mmol/L   Potassium 5.0 3.5 - 5.1 mmol/L   Chloride 98 (L) 101 - 111 mmol/L   CO2 23 22 - 32 mmol/L   Glucose, Bld 179 (H) 65 - 99 mg/dL   BUN 66 (H) 6 - 20 mg/dL   Creatinine, Ser 4.75 (H) 0.61 - 1.24 mg/dL   Calcium 8.9 8.9 - 10.3 mg/dL   Total Protein 5.8 (L) 6.5 - 8.1 g/dL    Albumin 2.6 (L) 3.5 - 5.0 g/dL   AST 68 (H) 15 - 41 U/L   ALT 28 17 - 63 U/L   Alkaline Phosphatase 91 38 - 126 U/L   Total Bilirubin 0.5 0.3 - 1.2 mg/dL   GFR calc non Af Amer 11 (L) >60 mL/min   GFR calc Af Amer 13 (L) >60 mL/min    Comment: (NOTE) The eGFR has been calculated using the CKD EPI equation. This calculation has not been validated in all clinical situations. eGFR's persistently <60 mL/min signify possible Chronic Kidney Disease.    Anion gap 12 5 - 15  CBC     Status: Abnormal   Collection Time: 06/18/15  4:08 AM  Result Value Ref Range   WBC 12.9 (H) 4.0 - 10.5 K/uL   RBC 4.03 (L) 4.22 - 5.81 MIL/uL   Hemoglobin 11.2 (L) 13.0 - 17.0 g/dL   HCT 34.4 (L) 39.0 - 52.0 %   MCV 85.4 78.0 - 100.0 fL   MCH 27.8 26.0 - 34.0 pg   MCHC 32.6 30.0 - 36.0 g/dL   RDW 14.0 11.5 - 15.5 %   Platelets 208 150 - 400 K/uL  Glucose, capillary     Status: Abnormal   Collection Time: 06/18/15  4:49 AM  Result Value Ref Range   Glucose-Capillary 129 (H) 65 - 99 mg/dL  Glucose, capillary     Status: Abnormal   Collection Time: 06/18/15  5:47 AM  Result Value Ref Range   Glucose-Capillary 126 (H) 65 - 99 mg/dL  Basic metabolic panel     Status: Abnormal   Collection Time: 06/18/15  6:35 AM  Result Value Ref Range   Sodium 130 (L) 135 - 145 mmol/L   Potassium 4.7 3.5 - 5.1 mmol/L   Chloride 98 (L) 101 - 111 mmol/L   CO2 21 (L) 22 - 32 mmol/L   Glucose, Bld 154 (H) 65 - 99 mg/dL   BUN 67 (H) 6 -  20 mg/dL   Creatinine, Ser 4.94 (H) 0.61 - 1.24 mg/dL   Calcium 8.5 (L) 8.9 - 10.3 mg/dL   GFR calc non Af Amer 11 (L) >60 mL/min   GFR calc Af Amer 12 (L) >60 mL/min    Comment: (NOTE) The eGFR has been calculated using the CKD EPI equation. This calculation has not been validated in all clinical situations. eGFR's persistently <60 mL/min signify possible Chronic Kidney Disease.    Anion gap 11 5 - 15  Glucose, capillary     Status: Abnormal   Collection Time: 06/18/15  6:53 AM   Result Value Ref Range   Glucose-Capillary 156 (H) 65 - 99 mg/dL  Troponin I (q 6hr x 3)     Status: Abnormal   Collection Time: 06/18/15  7:56 AM  Result Value Ref Range   Troponin I 0.06 (H) <0.031 ng/mL    Comment:        PERSISTENTLY INCREASED TROPONIN VALUES IN THE RANGE OF 0.04-0.49 ng/mL CAN BE SEEN IN:       -UNSTABLE ANGINA       -CONGESTIVE HEART FAILURE       -MYOCARDITIS       -CHEST TRAUMA       -ARRYHTHMIAS       -LATE PRESENTING MYOCARDIAL INFARCTION       -COPD   CLINICAL FOLLOW-UP RECOMMENDED.   Glucose, capillary     Status: Abnormal   Collection Time: 06/18/15  7:57 AM  Result Value Ref Range   Glucose-Capillary 178 (H) 65 - 99 mg/dL  Glucose, capillary     Status: Abnormal   Collection Time: 06/18/15 10:57 AM  Result Value Ref Range   Glucose-Capillary 215 (H) 65 - 99 mg/dL  Basic metabolic panel     Status: Abnormal   Collection Time: 06/18/15 11:00 AM  Result Value Ref Range   Sodium 131 (L) 135 - 145 mmol/L   Potassium 4.9 3.5 - 5.1 mmol/L   Chloride 97 (L) 101 - 111 mmol/L   CO2 19 (L) 22 - 32 mmol/L   Glucose, Bld 233 (H) 65 - 99 mg/dL   BUN 70 (H) 6 - 20 mg/dL   Creatinine, Ser 5.32 (H) 0.61 - 1.24 mg/dL   Calcium 8.7 (L) 8.9 - 10.3 mg/dL   GFR calc non Af Amer 10 (L) >60 mL/min   GFR calc Af Amer 11 (L) >60 mL/min    Comment: (NOTE) The eGFR has been calculated using the CKD EPI equation. This calculation has not been validated in all clinical situations. eGFR's persistently <60 mL/min signify possible Chronic Kidney Disease.    Anion gap 15 5 - 15  Basic metabolic panel     Status: Abnormal   Collection Time: 06/18/15 12:58 PM  Result Value Ref Range   Sodium 131 (L) 135 - 145 mmol/L   Potassium 5.1 3.5 - 5.1 mmol/L   Chloride 95 (L) 101 - 111 mmol/L   CO2 22 22 - 32 mmol/L   Glucose, Bld 242 (H) 65 - 99 mg/dL   BUN 72 (H) 6 - 20 mg/dL   Creatinine, Ser 5.60 (H) 0.61 - 1.24 mg/dL   Calcium 8.6 (L) 8.9 - 10.3 mg/dL   GFR calc non  Af Amer 9 (L) >60 mL/min   GFR calc Af Amer 10 (L) >60 mL/min    Comment: (NOTE) The eGFR has been calculated using the CKD EPI equation. This calculation has not been validated in all clinical situations. eGFR's  persistently <60 mL/min signify possible Chronic Kidney Disease.    Anion gap 14 5 - 15  CK     Status: Abnormal   Collection Time: 06/18/15 12:58 PM  Result Value Ref Range   Total CK 1983 (H) 49 - 397 U/L  Troponin I (q 6hr x 3)     Status: Abnormal   Collection Time: 06/18/15 12:58 PM  Result Value Ref Range   Troponin I 0.12 (H) <0.031 ng/mL    Comment:        PERSISTENTLY INCREASED TROPONIN VALUES IN THE RANGE OF 0.04-0.49 ng/mL CAN BE SEEN IN:       -UNSTABLE ANGINA       -CONGESTIVE HEART FAILURE       -MYOCARDITIS       -CHEST TRAUMA       -ARRYHTHMIAS       -LATE PRESENTING MYOCARDIAL INFARCTION       -COPD   CLINICAL FOLLOW-UP RECOMMENDED.   Protein / creatinine ratio, urine     Status: Abnormal   Collection Time: 06/18/15  4:10 PM  Result Value Ref Range   Creatinine, Urine 185.57 mg/dL   Total Protein, Urine 310 mg/dL    Comment: RESULTS CONFIRMED BY MANUAL DILUTION NO NORMAL RANGE ESTABLISHED FOR THIS TEST    Protein Creatinine Ratio 1.67 (H) 0.00 - 0.15 mg/mg[Cre]  Glucose, capillary     Status: Abnormal   Collection Time: 06/18/15  5:11 PM  Result Value Ref Range   Glucose-Capillary 223 (H) 65 - 99 mg/dL  Basic metabolic panel     Status: Abnormal   Collection Time: 06/18/15  6:15 PM  Result Value Ref Range   Sodium 127 (L) 135 - 145 mmol/L   Potassium 5.0 3.5 - 5.1 mmol/L   Chloride 92 (L) 101 - 111 mmol/L   CO2 20 (L) 22 - 32 mmol/L   Glucose, Bld 251 (H) 65 - 99 mg/dL   BUN 74 (H) 6 - 20 mg/dL   Creatinine, Ser 6.05 (H) 0.61 - 1.24 mg/dL   Calcium 8.1 (L) 8.9 - 10.3 mg/dL   GFR calc non Af Amer 8 (L) >60 mL/min   GFR calc Af Amer 10 (L) >60 mL/min    Comment: (NOTE) The eGFR has been calculated using the CKD EPI equation. This  calculation has not been validated in all clinical situations. eGFR's persistently <60 mL/min signify possible Chronic Kidney Disease.    Anion gap 15 5 - 15  Troponin I     Status: Abnormal   Collection Time: 06/18/15  6:15 PM  Result Value Ref Range   Troponin I 0.06 (H) <0.031 ng/mL    Comment:        PERSISTENTLY INCREASED TROPONIN VALUES IN THE RANGE OF 0.04-0.49 ng/mL CAN BE SEEN IN:       -UNSTABLE ANGINA       -CONGESTIVE HEART FAILURE       -MYOCARDITIS       -CHEST TRAUMA       -ARRYHTHMIAS       -LATE PRESENTING MYOCARDIAL INFARCTION       -COPD   CLINICAL FOLLOW-UP RECOMMENDED.   CK     Status: Abnormal   Collection Time: 06/18/15  6:15 PM  Result Value Ref Range   Total CK 1449 (H) 49 - 397 U/L  Glucose, capillary     Status: Abnormal   Collection Time: 06/18/15  9:21 PM  Result Value Ref Range   Glucose-Capillary 246 (H)  65 - 99 mg/dL  Surgical pcr screen     Status: None   Collection Time: 06/19/15  5:25 AM  Result Value Ref Range   MRSA, PCR NEGATIVE NEGATIVE   Staphylococcus aureus NEGATIVE NEGATIVE    Comment:        The Xpert SA Assay (FDA approved for NASAL specimens in patients over 58 years of age), is one component of a comprehensive surveillance program.  Test performance has been validated by [facility] for patients greater than or equal to 43 year old. It is not intended to diagnose infection nor to guide or monitor treatment.   CK     Status: Abnormal   Collection Time: 06/19/15  6:17 AM  Result Value Ref Range   Total CK 951 (H) 49 - 397 U/L  Procalcitonin     Status: None   Collection Time: 06/19/15  6:17 AM  Result Value Ref Range   Procalcitonin 52.55 ng/mL    Comment:        Interpretation: PCT >= 10 ng/mL: Important systemic inflammatory response, almost exclusively due to severe bacterial sepsis or septic shock. (NOTE)         ICU PCT Algorithm               Non ICU PCT Algorithm    ----------------------------      ------------------------------         PCT < 0.25 ng/mL                 PCT < 0.1 ng/mL     Stopping of antibiotics            Stopping of antibiotics       strongly encouraged.               strongly encouraged.    ----------------------------     ------------------------------       PCT level decrease by               PCT < 0.25 ng/mL       >= 80% from peak PCT       OR PCT 0.25 - 0.5 ng/mL          Stopping of antibiotics                                             encouraged.     Stopping of antibiotics           encouraged.    ----------------------------     ------------------------------       PCT level decrease by              PCT >= 0.25 ng/mL       < 80% from peak PCT        AND PCT >= 0.5 ng/mL             Continuing antibiotics                                              encouraged.       Continuing antibiotics            encouraged.    ----------------------------     ------------------------------     PCT level  increase compared          PCT > 0.5 ng/mL         with peak PCT AND          PCT >= 0.5 ng/mL             Escalation of antibiotics                                          strongly encouraged.      Escalation of antibiotics        strongly encouraged.   Glucose, capillary     Status: Abnormal   Collection Time: 06/19/15  7:43 AM  Result Value Ref Range   Glucose-Capillary 342 (H) 65 - 99 mg/dL   Comment 1 Notify RN   Renal function panel     Status: Abnormal   Collection Time: 06/19/15  9:10 AM  Result Value Ref Range   Sodium 127 (L) 135 - 145 mmol/L   Potassium 5.0 3.5 - 5.1 mmol/L   Chloride 86 (L) 101 - 111 mmol/L   CO2 19 (L) 22 - 32 mmol/L   Glucose, Bld 340 (H) 65 - 99 mg/dL   BUN 91 (H) 6 - 20 mg/dL   Creatinine, Ser 7.45 (H) 0.61 - 1.24 mg/dL   Calcium 8.3 (L) 8.9 - 10.3 mg/dL   Phosphorus 8.3 (H) 2.5 - 4.6 mg/dL   Albumin 2.2 (L) 3.5 - 5.0 g/dL   GFR calc non Af Amer 6 (L) >60 mL/min   GFR calc Af Amer 7 (L) >60 mL/min    Comment:  (NOTE) The eGFR has been calculated using the CKD EPI equation. This calculation has not been validated in all clinical situations. eGFR's persistently <60 mL/min signify possible Chronic Kidney Disease.    Anion gap 22 (H) 5 - 15  CBC     Status: Abnormal   Collection Time: 06/19/15  9:10 AM  Result Value Ref Range   WBC 16.4 (H) 4.0 - 10.5 K/uL   RBC 4.34 4.22 - 5.81 MIL/uL   Hemoglobin 11.9 (L) 13.0 - 17.0 g/dL   HCT 36.1 (L) 39.0 - 52.0 %   MCV 83.2 78.0 - 100.0 fL   MCH 27.4 26.0 - 34.0 pg   MCHC 33.0 30.0 - 36.0 g/dL   RDW 14.2 11.5 - 15.5 %   Platelets 233 150 - 400 K/uL  Glucose, capillary     Status: Abnormal   Collection Time: 06/19/15 11:34 AM  Result Value Ref Range   Glucose-Capillary 340 (H) 65 - 99 mg/dL   Comment 1 Notify RN   Type and screen [place]     Status: None (Preliminary result)   Collection Time: 06/19/15  2:30 PM  Result Value Ref Range   ABO/RH(D) O POS    Antibody Screen PENDING    Sample Expiration 06/22/2015     Dg Chest 2 View  06/17/2015  CLINICAL DATA:  Weakness. EXAM: CHEST  2 VIEW COMPARISON:  None. FINDINGS: Trachea is midline. Heart size within normal limits. Lungs are clear. No pleural fluid. Degenerative changes are seen in the spine. IMPRESSION: No acute findings. Electronically Signed   By: Lorin Picket M.D.   On: 06/17/2015 18:10   Dg Abd 1 View  06/17/2015  CLINICAL DATA:  Abdominal distention EXAM: ABDOMEN - 1 VIEW COMPARISON:  None. FINDINGS: Brachytherapy  seeds overlie the prostate. There mildly dilated small bowel loops in the central abdomen measuring up to 3.8 cm in diameter. Mild stool is seen throughout the colon. No evidence of pneumatosis or pneumoperitoneum. No pathologic soft tissue calcifications. IMPRESSION: Mildly dilated small bowel loops in the central abdomen, for which the differential includes a mid to distal small bowel obstruction or adynamic ileus. Electronically Signed   By: Ilona Sorrel  M.D.   On: 06/17/2015 21:12   US Renal  06/18/2015  CLINICAL DATA:  Acute onset of renal failure.  Initial encounter. EXAM: RENAL / URINARY TRACT ULTRASOUND COMPLETE COMPARISON:  None. FINDINGS: Right Kidney: Length: 10.2 cm. Increased parenchymal echogenicity noted. A small 1.1 cm cyst is noted at the interpole region of the right kidney. No hydronephrosis visualized. Left Kidney: Length: 11.9 cm. Mildly increased parenchymal echogenicity noted. No mass or hydronephrosis visualized. Bladder: Decompressed and not well assessed. IMPRESSION: 1. No evidence of hydronephrosis. 2. Increased renal parenchymal echogenicity likely reflects medical renal disease. 3. Small right renal cyst noted. Electronically Signed   By: Garald Balding M.D.   On: 06/18/2015 00:41   Dg Shoulder Right Port  06/18/2015  CLINICAL DATA:  Patient with diffuse right shoulder pain. No known injury. EXAM: PORTABLE RIGHT SHOULDER - 2+ VIEW COMPARISON:  Chest radiograph 06/17/2015 FINDINGS: Limited evaluation due to inability to position patient. No evidence for displaced fracture. AC joint degenerative changes. Visualized right hemi thorax is unremarkable. IMPRESSION: Limited exam due to difficulty with patient positioning. Within the above limitation, no evidence for displaced fracture. Recommend evaluation with standard radiographic views when patient clinically able. Electronically Signed   By: Lovey Newcomer M.D.   On: 06/18/2015 13:28   Dg Abd Portable 1v  06/19/2015  CLINICAL DATA:  Abdominal distension EXAM: PORTABLE ABDOMEN - 1 VIEW COMPARISON:  06/17/2015 FINDINGS: Interval significant distension of the stomach is seen. Increasing small bowel dilatation is noted. Persisting colonic air is noted consistent with a partial small bowel obstruction. Prostate brachytherapy seeds are noted. No free air is noted. IMPRESSION: Increasing distension of the small bowel and stomach as described. Decompression is recommended. Electronically  Signed   By: Inez Catalina M.D.   On: 06/19/2015 10:54   Dg Foot Complete Left  06/17/2015  CLINICAL DATA:  Unable to walk.  Foot pain.  Diabetes. EXAM: LEFT FOOT - COMPLETE 3+ VIEW COMPARISON:  None. FINDINGS: Questionable soft tissue ulceration at the tips of the first and second toes. 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 No visible radiographic changes of acute osteomyelitis. No fracture, subluxation or dislocation. IMPRESSION: No radiographic changes of osteomyelitis. Electronically Signed   By: Rolm Baptise M.D.   On: 06/17/2015 18:11    ROS negative except above Blood pressure 137/76, pulse 95, temperature 98 F (36.7 C), temperature source Oral, resp. rate 18, height _0  (1.88 m), weight 90.719 kg (200 lb), SpO2 96 %. Physical Exam Vital signs stable afebrile no acute distress exam pertinent for his abdomen being soft nontender  rare bowel sound minimal tympany labs and x-rays reviewed Assessment: seemingly self-limited coffee ground in NG tube in a patient with multiple medical problems  plan: We will follow with you await CT that has been ordered and consider endoscopy when necessary or when ileus resolves which I discussed with the patient and otherwise continue pump inhibitors and call us sooner if question or problem otherwise will last my partner Dr. Jacinto Reap to check on tomorrow  [facility]  E 06/19/2015, 3:15 PM

## 2015-06-19 NOTE — Consult Note (Signed)
Reason for Consult:Abdominal distension and CT findings Referring Physician: Short  David Ford is an 74 y.o. male.  HPI: Patient admitted after being found down.  This was two days ago.  Multiple problems including acute renal failure and gangrene of his feet.  Surgery for his feet cancelled because of renal failure and other problems.  Had hemodialysis catheter placed.  CT done with ?barium, questionable malrotation, bowel obstruction (patient has had not prior surgery) and right  Sided colitis and possibly amlignancy.  Past Medical History  Diagnosis Date  . Diabetes mellitus without complication (HCC)   . Hypertension   . DKA (diabetic ketoacidoses) (HCC) 06/18/2015  . AKI (acute kidney injury) (HCC) 06/18/2015  . Cancer (HCC)     hx of prostate cancer    Past Surgical History  Procedure Laterality Date  . Prostate seeds      Family History  Problem Relation Age of Onset  . Diabetes Mother   . Diabetes Sister   . Diabetes Brother     Social History:  reports that he has never smoked. He has never used smokeless tobacco. He reports that he drinks alcohol. He reports that he does not use illicit drugs.  Allergies: No Known Allergies  Medications: I have reviewed the patient's current medications.  Results for orders placed or performed during the hospital encounter of 06/17/15 (from the past 48 hour(s))  Urinalysis, Routine w reflex microscopic (not at [facility])     Status: Abnormal   Collection Time: 06/17/15  9:20 PM  Result Value Ref Range   Color, Urine YELLOW YELLOW   APPearance CLOUDY (A) CLEAR   Specific Gravity, Urine 1.023 1.005 - 1.030   pH 5.0 5.0 - 8.0   Glucose, UA >1000 (A) NEGATIVE mg/dL   Hgb urine dipstick LARGE (A) NEGATIVE   Bilirubin Urine NEGATIVE NEGATIVE   Ketones, ur NEGATIVE NEGATIVE mg/dL   Protein, ur 100 (A) NEGATIVE mg/dL   Nitrite NEGATIVE NEGATIVE   Leukocytes, UA NEGATIVE NEGATIVE  Urine microscopic-add on     Status: Abnormal    Collection Time: 06/17/15  9:20 PM  Result Value Ref Range   Squamous Epithelial / LPF 0-5 (A) NONE SEEN   WBC, UA 0-5 0 - 5 WBC/hpf   RBC / HPF 6-30 0 - 5 RBC/hpf   Bacteria, UA FEW (A) NONE SEEN   Casts HYALINE CASTS (A) NEGATIVE  Glucose, capillary     Status: Abnormal   Collection Time: 06/17/15 10:13 PM  Result Value Ref Range   Glucose-Capillary 281 (H) 65 - 99 mg/dL  Magnesium     Status: None   Collection Time: 06/17/15 10:35 PM  Result Value Ref Range   Magnesium 2.2 1.7 - 2.4 mg/dL  Phosphorus     Status: Abnormal   Collection Time: 06/17/15 10:35 PM  Result Value Ref Range   Phosphorus 6.0 (H) 2.5 - 4.6 mg/dL  Troponin I (q 6hr x 3)     Status: Abnormal   Collection Time: 06/17/15 10:35 PM  Result Value Ref Range   Troponin I 0.19 (H) <0.031 ng/mL    Comment:        PERSISTENTLY INCREASED TROPONIN VALUES IN THE RANGE OF 0.04-0.49 ng/mL CAN BE SEEN IN:       -UNSTABLE ANGINA       -CONGESTIVE HEART FAILURE       -MYOCARDITIS       -CHEST TRAUMA       -ARRYHTHMIAS       -  LATE PRESENTING MYOCARDIAL INFARCTION       -COPD   CLINICAL FOLLOW-UP RECOMMENDED.   Procalcitonin - Baseline     Status: None   Collection Time: 06/17/15 10:35 PM  Result Value Ref Range   Procalcitonin 35.32 ng/mL    Comment:        Interpretation: PCT >= 10 ng/mL: Important systemic inflammatory response, almost exclusively due to severe bacterial sepsis or septic shock. (NOTE)         ICU PCT Algorithm               Non ICU PCT Algorithm    ----------------------------     ------------------------------         PCT < 0.25 ng/mL                 PCT < 0.1 ng/mL     Stopping of antibiotics            Stopping of antibiotics       strongly encouraged.               strongly encouraged.    ----------------------------     ------------------------------       PCT level decrease by               PCT < 0.25 ng/mL       >= 80% from peak PCT       OR PCT 0.25 - 0.5 ng/mL          Stopping of  antibiotics                                             encouraged.     Stopping of antibiotics           encouraged.    ----------------------------     ------------------------------       PCT level decrease by              PCT >= 0.25 ng/mL       < 80% from peak PCT        AND PCT >= 0.5 ng/mL             Continuing antibiotics                                              encouraged.       Continuing antibiotics            encouraged.    ----------------------------     ------------------------------     PCT level increase compared          PCT > 0.5 ng/mL         with peak PCT AND          PCT >= 0.5 ng/mL             Escalation of antibiotics                                          strongly encouraged.      Escalation of antibiotics        strongly encouraged.   CBC       Status: Abnormal   Collection Time: 06/17/15 10:35 PM  Result Value Ref Range   WBC 16.1 (H) 4.0 - 10.5 K/uL   RBC 4.33 4.22 - 5.81 MIL/uL   Hemoglobin 12.1 (L) 13.0 - 17.0 g/dL   HCT 37.0 (L) 39.0 - 52.0 %   MCV 85.5 78.0 - 100.0 fL   MCH 27.9 26.0 - 34.0 pg   MCHC 32.7 30.0 - 36.0 g/dL   RDW 13.9 11.5 - 15.5 %   Platelets 196 150 - 400 K/uL  Basic metabolic panel     Status: Abnormal   Collection Time: 06/17/15 10:35 PM  Result Value Ref Range   Sodium 133 (L) 135 - 145 mmol/L   Potassium 6.3 (HH) 3.5 - 5.1 mmol/L    Comment: CRITICAL RESULT CALLED TO, READ BACK BY AND VERIFIED WITH: QIU,R RN 06/17/2015 [address] NO VISIBLE HEMOLYSIS    Chloride 95 (L) 101 - 111 mmol/L   CO2 23 22 - 32 mmol/L   Glucose, Bld 332 (H) 65 - 99 mg/dL   BUN 64 (H) 6 - 20 mg/dL   Creatinine, Ser 4.18 (H) 0.61 - 1.24 mg/dL   Calcium 9.1 8.9 - 10.3 mg/dL   GFR calc non Af Amer 13 (L) >60 mL/min   GFR calc Af Amer 15 (L) >60 mL/min    Comment: (NOTE) The eGFR has been calculated using the CKD EPI equation. This calculation has not been validated in all clinical situations. eGFR's persistently <60 mL/min signify  possible Chronic Kidney Disease.    Anion gap 15 5 - 15  CK     Status: Abnormal   Collection Time: 06/17/15 10:35 PM  Result Value Ref Range   Total CK 2960 (H) 49 - 397 U/L  Glucose, capillary     Status: Abnormal   Collection Time: 06/17/15 11:47 PM  Result Value Ref Range   Glucose-Capillary 293 (H) 65 - 99 mg/dL  Lactic acid, plasma     Status: None   Collection Time: 06/18/15 12:01 AM  Result Value Ref Range   Lactic Acid, Venous 1.7 0.5 - 2.0 mmol/L  Glucose, capillary     Status: Abnormal   Collection Time: 06/18/15 12:37 AM  Result Value Ref Range   Glucose-Capillary 393 (H) 65 - 99 mg/dL  Creatinine, urine, random     Status: None   Collection Time: 06/18/15  1:16 AM  Result Value Ref Range   Creatinine, Urine 217.96 mg/dL  Sodium, urine, random     Status: None   Collection Time: 06/18/15  1:16 AM  Result Value Ref Range   Sodium, Ur <10 mmol/L  Urine rapid drug screen (hosp performed)     Status: None   Collection Time: 06/18/15  1:17 AM  Result Value Ref Range   Opiates NONE DETECTED NONE DETECTED   Cocaine NONE DETECTED NONE DETECTED   Benzodiazepines NONE DETECTED NONE DETECTED   Amphetamines NONE DETECTED NONE DETECTED   Tetrahydrocannabinol NONE DETECTED NONE DETECTED   Barbiturates NONE DETECTED NONE DETECTED    Comment:        DRUG SCREEN FOR MEDICAL PURPOSES ONLY.  IF CONFIRMATION IS NEEDED FOR ANY PURPOSE, NOTIFY LAB WITHIN 5 DAYS.        LOWEST DETECTABLE LIMITS FOR URINE DRUG SCREEN Drug Class       Cutoff (ng/mL) Amphetamine      1000 Barbiturate      200 Benzodiazepine   200 Tricyclics       300 Opiates            300 Cocaine          300 THC              50   Urine culture     Status: None   Collection Time: 06/18/15  1:18 AM  Result Value Ref Range   Specimen Description URINE, CATHETERIZED    Special Requests NONE    Culture NO GROWTH 1 DAY    Report Status 06/19/2015 FINAL   Glucose, capillary     Status: Abnormal   Collection  Time: 06/18/15  1:46 AM  Result Value Ref Range   Glucose-Capillary 293 (H) 65 - 99 mg/dL  Hemoglobin A1c     Status: Abnormal   Collection Time: 06/18/15  2:30 AM  Result Value Ref Range   Hgb A1c MFr Bld 14.2 (H) 4.8 - 5.6 %    Comment: (NOTE)         Pre-diabetes: 5.7 - 6.4         Diabetes: >6.4         Glycemic control for adults with diabetes: <7.0    Mean Plasma Glucose 361 mg/dL    Comment: (NOTE) Performed At: [facility] [address] [place], [place] [id number] [place] William F MD Ph:[phone]   TSH     Status: None   Collection Time: 06/18/15  2:30 AM  Result Value Ref Range   TSH 0.839 0.350 - 4.500 uIU/mL  Basic metabolic panel     Status: Abnormal   Collection Time: 06/18/15  2:30 AM  Result Value Ref Range   Sodium 132 (L) 135 - 145 mmol/L   Potassium 4.7 3.5 - 5.1 mmol/L    Comment: DELTA CHECK NOTED NO VISIBLE HEMOLYSIS    Chloride 98 (L) 101 - 111 mmol/L   CO2 23 22 - 32 mmol/L   Glucose, Bld 266 (H) 65 - 99 mg/dL   BUN 65 (H) 6 - 20 mg/dL   Creatinine, Ser 4.64 (H) 0.61 - 1.24 mg/dL   Calcium 8.9 8.9 - 10.3 mg/dL   GFR calc non Af Amer 11 (L) >60 mL/min   GFR calc Af Amer 13 (L) >60 mL/min    Comment: (NOTE) The eGFR has been calculated using the CKD EPI equation. This calculation has not been validated in all clinical situations. eGFR's persistently <60 mL/min signify possible Chronic Kidney Disease.    Anion gap 11 5 - 15  Glucose, capillary     Status: Abnormal   Collection Time: 06/18/15  2:43 AM  Result Value Ref Range   Glucose-Capillary 229 (H) 65 - 99 mg/dL  Glucose, capillary     Status: Abnormal   Collection Time: 06/18/15  3:44 AM  Result Value Ref Range   Glucose-Capillary 174 (H) 65 - 99 mg/dL  CK     Status: Abnormal   Collection Time: 06/18/15  4:08 AM  Result Value Ref Range   Total CK 2284 (H) 49 - 397 U/L  Troponin I (q 6hr x 3)     Status: Abnormal   Collection Time: 06/18/15  4:08 AM  Result Value Ref Range    Troponin I 0.34 (H) <0.031 ng/mL    Comment:        PERSISTENTLY INCREASED TROPONIN VALUES IN THE RANGE OF 0.04-0.49 ng/mL CAN BE SEEN IN:       -UNSTABLE ANGINA       -CONGESTIVE HEART FAILURE       -MYOCARDITIS       -CHEST TRAUMA       -  ARRYHTHMIAS       -LATE PRESENTING MYOCARDIAL INFARCTION       -COPD   CLINICAL FOLLOW-UP RECOMMENDED.   Prealbumin     Status: Abnormal   Collection Time: 06/18/15  4:08 AM  Result Value Ref Range   Prealbumin 10.0 (L) 18 - 38 mg/dL  Magnesium     Status: None   Collection Time: 06/18/15  4:08 AM  Result Value Ref Range   Magnesium 2.2 1.7 - 2.4 mg/dL  Phosphorus     Status: Abnormal   Collection Time: 06/18/15  4:08 AM  Result Value Ref Range   Phosphorus 5.7 (H) 2.5 - 4.6 mg/dL  Comprehensive metabolic panel     Status: Abnormal   Collection Time: 06/18/15  4:08 AM  Result Value Ref Range   Sodium 133 (L) 135 - 145 mmol/L   Potassium 5.0 3.5 - 5.1 mmol/L   Chloride 98 (L) 101 - 111 mmol/L   CO2 23 22 - 32 mmol/L   Glucose, Bld 179 (H) 65 - 99 mg/dL   BUN 66 (H) 6 - 20 mg/dL   Creatinine, Ser 4.75 (H) 0.61 - 1.24 mg/dL   Calcium 8.9 8.9 - 10.3 mg/dL   Total Protein 5.8 (L) 6.5 - 8.1 g/dL   Albumin 2.6 (L) 3.5 - 5.0 g/dL   AST 68 (H) 15 - 41 U/L   ALT 28 17 - 63 U/L   Alkaline Phosphatase 91 38 - 126 U/L   Total Bilirubin 0.5 0.3 - 1.2 mg/dL   GFR calc non Af Amer 11 (L) >60 mL/min   GFR calc Af Amer 13 (L) >60 mL/min    Comment: (NOTE) The eGFR has been calculated using the CKD EPI equation. This calculation has not been validated in all clinical situations. eGFR's persistently <60 mL/min signify possible Chronic Kidney Disease.    Anion gap 12 5 - 15  CBC     Status: Abnormal   Collection Time: 06/18/15  4:08 AM  Result Value Ref Range   WBC 12.9 (H) 4.0 - 10.5 K/uL   RBC 4.03 (L) 4.22 - 5.81 MIL/uL   Hemoglobin 11.2 (L) 13.0 - 17.0 g/dL   HCT 34.4 (L) 39.0 - 52.0 %   MCV 85.4 78.0 - 100.0 fL   MCH 27.8 26.0 - 34.0  pg   MCHC 32.6 30.0 - 36.0 g/dL   RDW 14.0 11.5 - 15.5 %   Platelets 208 150 - 400 K/uL  Glucose, capillary     Status: Abnormal   Collection Time: 06/18/15  4:49 AM  Result Value Ref Range   Glucose-Capillary 129 (H) 65 - 99 mg/dL  Glucose, capillary     Status: Abnormal   Collection Time: 06/18/15  5:47 AM  Result Value Ref Range   Glucose-Capillary 126 (H) 65 - 99 mg/dL  Basic metabolic panel     Status: Abnormal   Collection Time: 06/18/15  6:35 AM  Result Value Ref Range   Sodium 130 (L) 135 - 145 mmol/L   Potassium 4.7 3.5 - 5.1 mmol/L   Chloride 98 (L) 101 - 111 mmol/L   CO2 21 (L) 22 - 32 mmol/L   Glucose, Bld 154 (H) 65 - 99 mg/dL   BUN 67 (H) 6 - 20 mg/dL   Creatinine, Ser 4.94 (H) 0.61 - 1.24 mg/dL   Calcium 8.5 (L) 8.9 - 10.3 mg/dL   GFR calc non Af Amer 11 (L) >60 mL/min   GFR calc Af Amer 12 (L) >  60 mL/min    Comment: (NOTE) The eGFR has been calculated using the CKD EPI equation. This calculation has not been validated in all clinical situations. eGFR's persistently <60 mL/min signify possible Chronic Kidney Disease.    Anion gap 11 5 - 15  Glucose, capillary     Status: Abnormal   Collection Time: 06/18/15  6:53 AM  Result Value Ref Range   Glucose-Capillary 156 (H) 65 - 99 mg/dL  Troponin I (q 6hr x 3)     Status: Abnormal   Collection Time: 06/18/15  7:56 AM  Result Value Ref Range   Troponin I 0.06 (H) <0.031 ng/mL    Comment:        PERSISTENTLY INCREASED TROPONIN VALUES IN THE RANGE OF 0.04-0.49 ng/mL CAN BE SEEN IN:       -UNSTABLE ANGINA       -CONGESTIVE HEART FAILURE       -MYOCARDITIS       -CHEST TRAUMA       -ARRYHTHMIAS       -LATE PRESENTING MYOCARDIAL INFARCTION       -COPD   CLINICAL FOLLOW-UP RECOMMENDED.   Glucose, capillary     Status: Abnormal   Collection Time: 06/18/15  7:57 AM  Result Value Ref Range   Glucose-Capillary 178 (H) 65 - 99 mg/dL  Glucose, capillary     Status: Abnormal   Collection Time: 06/18/15 10:57 AM   Result Value Ref Range   Glucose-Capillary 215 (H) 65 - 99 mg/dL  Basic metabolic panel     Status: Abnormal   Collection Time: 06/18/15 11:00 AM  Result Value Ref Range   Sodium 131 (L) 135 - 145 mmol/L   Potassium 4.9 3.5 - 5.1 mmol/L   Chloride 97 (L) 101 - 111 mmol/L   CO2 19 (L) 22 - 32 mmol/L   Glucose, Bld 233 (H) 65 - 99 mg/dL   BUN 70 (H) 6 - 20 mg/dL   Creatinine, Ser 5.32 (H) 0.61 - 1.24 mg/dL   Calcium 8.7 (L) 8.9 - 10.3 mg/dL   GFR calc non Af Amer 10 (L) >60 mL/min   GFR calc Af Amer 11 (L) >60 mL/min    Comment: (NOTE) The eGFR has been calculated using the CKD EPI equation. This calculation has not been validated in all clinical situations. eGFR's persistently <60 mL/min signify possible Chronic Kidney Disease.    Anion gap 15 5 - 15  Basic metabolic panel     Status: Abnormal   Collection Time: 06/18/15 12:58 PM  Result Value Ref Range   Sodium 131 (L) 135 - 145 mmol/L   Potassium 5.1 3.5 - 5.1 mmol/L   Chloride 95 (L) 101 - 111 mmol/L   CO2 22 22 - 32 mmol/L   Glucose, Bld 242 (H) 65 - 99 mg/dL   BUN 72 (H) 6 - 20 mg/dL   Creatinine, Ser 5.60 (H) 0.61 - 1.24 mg/dL   Calcium 8.6 (L) 8.9 - 10.3 mg/dL   GFR calc non Af Amer 9 (L) >60 mL/min   GFR calc Af Amer 10 (L) >60 mL/min    Comment: (NOTE) The eGFR has been calculated using the CKD EPI equation. This calculation has not been validated in all clinical situations. eGFR's persistently <60 mL/min signify possible Chronic Kidney Disease.    Anion gap 14 5 - 15  CK     Status: Abnormal   Collection Time: 06/18/15 12:58 PM  Result Value Ref Range   Total CK   1983 (H) 49 - 397 U/L  Troponin I (q 6hr x 3)     Status: Abnormal   Collection Time: 06/18/15 12:58 PM  Result Value Ref Range   Troponin I 0.12 (H) <0.031 ng/mL    Comment:        PERSISTENTLY INCREASED TROPONIN VALUES IN THE RANGE OF 0.04-0.49 ng/mL CAN BE SEEN IN:       -UNSTABLE ANGINA       -CONGESTIVE HEART FAILURE       -MYOCARDITIS        -CHEST TRAUMA       -ARRYHTHMIAS       -LATE PRESENTING MYOCARDIAL INFARCTION       -COPD   CLINICAL FOLLOW-UP RECOMMENDED.   Protein / creatinine ratio, urine     Status: Abnormal   Collection Time: 06/18/15  4:10 PM  Result Value Ref Range   Creatinine, Urine 185.57 mg/dL   Total Protein, Urine 310 mg/dL    Comment: RESULTS CONFIRMED BY MANUAL DILUTION NO NORMAL RANGE ESTABLISHED FOR THIS TEST    Protein Creatinine Ratio 1.67 (H) 0.00 - 0.15 mg/mg[Cre]  Glucose, capillary     Status: Abnormal   Collection Time: 06/18/15  5:11 PM  Result Value Ref Range   Glucose-Capillary 223 (H) 65 - 99 mg/dL  Basic metabolic panel     Status: Abnormal   Collection Time: 06/18/15  6:15 PM  Result Value Ref Range   Sodium 127 (L) 135 - 145 mmol/L   Potassium 5.0 3.5 - 5.1 mmol/L   Chloride 92 (L) 101 - 111 mmol/L   CO2 20 (L) 22 - 32 mmol/L   Glucose, Bld 251 (H) 65 - 99 mg/dL   BUN 74 (H) 6 - 20 mg/dL   Creatinine, Ser 6.05 (H) 0.61 - 1.24 mg/dL   Calcium 8.1 (L) 8.9 - 10.3 mg/dL   GFR calc non Af Amer 8 (L) >60 mL/min   GFR calc Af Amer 10 (L) >60 mL/min    Comment: (NOTE) The eGFR has been calculated using the CKD EPI equation. This calculation has not been validated in all clinical situations. eGFR's persistently <60 mL/min signify possible Chronic Kidney Disease.    Anion gap 15 5 - 15  Troponin I     Status: Abnormal   Collection Time: 06/18/15  6:15 PM  Result Value Ref Range   Troponin I 0.06 (H) <0.031 ng/mL    Comment:        PERSISTENTLY INCREASED TROPONIN VALUES IN THE RANGE OF 0.04-0.49 ng/mL CAN BE SEEN IN:       -UNSTABLE ANGINA       -CONGESTIVE HEART FAILURE       -MYOCARDITIS       -CHEST TRAUMA       -ARRYHTHMIAS       -LATE PRESENTING MYOCARDIAL INFARCTION       -COPD   CLINICAL FOLLOW-UP RECOMMENDED.   CK     Status: Abnormal   Collection Time: 06/18/15  6:15 PM  Result Value Ref Range   Total CK 1449 (H) 49 - 397 U/L  Glucose, capillary      Status: Abnormal   Collection Time: 06/18/15  9:21 PM  Result Value Ref Range   Glucose-Capillary 246 (H) 65 - 99 mg/dL  Surgical pcr screen     Status: None   Collection Time: 06/19/15  5:25 AM  Result Value Ref Range   MRSA, PCR NEGATIVE NEGATIVE   Staphylococcus aureus NEGATIVE NEGATIVE  Comment:        The Xpert SA Assay (FDA approved for NASAL specimens in patients over 64 years of age), is one component of a comprehensive surveillance program.  Test performance has been validated by [facility] for patients greater than or equal to 80 year old. It is not intended to diagnose infection nor to guide or monitor treatment.   CK     Status: Abnormal   Collection Time: 06/19/15  6:17 AM  Result Value Ref Range   Total CK 951 (H) 49 - 397 U/L  Procalcitonin     Status: None   Collection Time: 06/19/15  6:17 AM  Result Value Ref Range   Procalcitonin 52.55 ng/mL    Comment:        Interpretation: PCT >= 10 ng/mL: Important systemic inflammatory response, almost exclusively due to severe bacterial sepsis or septic shock. (NOTE)         ICU PCT Algorithm               Non ICU PCT Algorithm    ----------------------------     ------------------------------         PCT < 0.25 ng/mL                 PCT < 0.1 ng/mL     Stopping of antibiotics            Stopping of antibiotics       strongly encouraged.               strongly encouraged.    ----------------------------     ------------------------------       PCT level decrease by               PCT < 0.25 ng/mL       >= 80% from peak PCT       OR PCT 0.25 - 0.5 ng/mL          Stopping of antibiotics                                             encouraged.     Stopping of antibiotics           encouraged.    ----------------------------     ------------------------------       PCT level decrease by              PCT >= 0.25 ng/mL       < 80% from peak PCT        AND PCT >= 0.5 ng/mL             Continuing antibiotics                                               encouraged.       Continuing antibiotics            encouraged.    ----------------------------     ------------------------------     PCT level increase compared          PCT > 0.5 ng/mL         with peak PCT AND          PCT >= 0.5 ng/mL  Escalation of antibiotics                                          strongly encouraged.      Escalation of antibiotics        strongly encouraged.   Glucose, capillary     Status: Abnormal   Collection Time: 06/19/15  7:43 AM  Result Value Ref Range   Glucose-Capillary 342 (H) 65 - 99 mg/dL   Comment 1 Notify RN   Renal function panel     Status: Abnormal   Collection Time: 06/19/15  9:10 AM  Result Value Ref Range   Sodium 127 (L) 135 - 145 mmol/L   Potassium 5.0 3.5 - 5.1 mmol/L   Chloride 86 (L) 101 - 111 mmol/L   CO2 19 (L) 22 - 32 mmol/L   Glucose, Bld 340 (H) 65 - 99 mg/dL   BUN 91 (H) 6 - 20 mg/dL   Creatinine, Ser 7.45 (H) 0.61 - 1.24 mg/dL   Calcium 8.3 (L) 8.9 - 10.3 mg/dL   Phosphorus 8.3 (H) 2.5 - 4.6 mg/dL   Albumin 2.2 (L) 3.5 - 5.0 g/dL   GFR calc non Af Amer 6 (L) >60 mL/min   GFR calc Af Amer 7 (L) >60 mL/min    Comment: (NOTE) The eGFR has been calculated using the CKD EPI equation. This calculation has not been validated in all clinical situations. eGFR's persistently <60 mL/min signify possible Chronic Kidney Disease.    Anion gap 22 (H) 5 - 15  CBC     Status: Abnormal   Collection Time: 06/19/15  9:10 AM  Result Value Ref Range   WBC 16.4 (H) 4.0 - 10.5 K/uL   RBC 4.34 4.22 - 5.81 MIL/uL   Hemoglobin 11.9 (L) 13.0 - 17.0 g/dL   HCT 36.1 (L) 39.0 - 52.0 %   MCV 83.2 78.0 - 100.0 fL   MCH 27.4 26.0 - 34.0 pg   MCHC 33.0 30.0 - 36.0 g/dL   RDW 14.2 11.5 - 15.5 %   Platelets 233 150 - 400 K/uL  Glucose, capillary     Status: Abnormal   Collection Time: 06/19/15 11:34 AM  Result Value Ref Range   Glucose-Capillary 340 (H) 65 - 99 mg/dL   Comment 1 Notify RN    Lactic acid, plasma     Status: None   Collection Time: 06/19/15  2:30 PM  Result Value Ref Range   Lactic Acid, Venous 1.7 0.5 - 2.0 mmol/L  CBC     Status: Abnormal   Collection Time: 06/19/15  2:30 PM  Result Value Ref Range   WBC 15.1 (H) 4.0 - 10.5 K/uL   RBC 3.86 (L) 4.22 - 5.81 MIL/uL   Hemoglobin 10.8 (L) 13.0 - 17.0 g/dL   HCT 32.0 (L) 39.0 - 52.0 %   MCV 82.9 78.0 - 100.0 fL   MCH 28.0 26.0 - 34.0 pg   MCHC 33.8 30.0 - 36.0 g/dL   RDW 14.2 11.5 - 15.5 %   Platelets 214 150 - 400 K/uL  Comprehensive metabolic panel     Status: Abnormal   Collection Time: 06/19/15  2:30 PM  Result Value Ref Range   Sodium 126 (L) 135 - 145 mmol/L   Potassium 5.0 3.5 - 5.1 mmol/L   Chloride 86 (L) 101 - 111 mmol/L   CO2 19 (L) 22 -   32 mmol/L   Glucose, Bld 367 (H) 65 - 99 mg/dL   BUN 100 (H) 6 - 20 mg/dL   Creatinine, Ser 8.15 (H) 0.61 - 1.24 mg/dL   Calcium 8.1 (L) 8.9 - 10.3 mg/dL   Total Protein 5.7 (L) 6.5 - 8.1 g/dL   Albumin 2.1 (L) 3.5 - 5.0 g/dL   AST 69 (H) 15 - 41 U/L   ALT 45 17 - 63 U/L   Alkaline Phosphatase 83 38 - 126 U/L   Total Bilirubin 1.0 0.3 - 1.2 mg/dL   GFR calc non Af Amer 6 (L) >60 mL/min   GFR calc Af Amer 7 (L) >60 mL/min    Comment: (NOTE) The eGFR has been calculated using the CKD EPI equation. This calculation has not been validated in all clinical situations. eGFR's persistently <60 mL/min signify possible Chronic Kidney Disease.    Anion gap 21 (H) 5 - 15  Troponin I     Status: Abnormal   Collection Time: 06/19/15  2:30 PM  Result Value Ref Range   Troponin I 0.13 (H) <0.031 ng/mL    Comment:        PERSISTENTLY INCREASED TROPONIN VALUES IN THE RANGE OF 0.04-0.49 ng/mL CAN BE SEEN IN:       -UNSTABLE ANGINA       -CONGESTIVE HEART FAILURE       -MYOCARDITIS       -CHEST TRAUMA       -ARRYHTHMIAS       -LATE PRESENTING MYOCARDIAL INFARCTION       -COPD   CLINICAL FOLLOW-UP RECOMMENDED.   Type and screen [place]      Status: None   Collection Time: 06/19/15  2:30 PM  Result Value Ref Range   ABO/RH(D) O POS    Antibody Screen NEG    Sample Expiration 06/22/2015   ABO/Rh     Status: None   Collection Time: 06/19/15  2:30 PM  Result Value Ref Range   ABO/RH(D) O POS   Renal function panel     Status: Abnormal   Collection Time: 06/19/15  3:43 PM  Result Value Ref Range   Sodium 129 (L) 135 - 145 mmol/L   Potassium 4.8 3.5 - 5.1 mmol/L   Chloride 88 (L) 101 - 111 mmol/L   CO2 20 (L) 22 - 32 mmol/L   Glucose, Bld 357 (H) 65 - 99 mg/dL   BUN 101 (H) 6 - 20 mg/dL   Creatinine, Ser 8.17 (H) 0.61 - 1.24 mg/dL   Calcium 8.2 (L) 8.9 - 10.3 mg/dL   Phosphorus 7.1 (H) 2.5 - 4.6 mg/dL   Albumin 2.2 (L) 3.5 - 5.0 g/dL   GFR calc non Af Amer 6 (L) >60 mL/min   GFR calc Af Amer 7 (L) >60 mL/min    Comment: (NOTE) The eGFR has been calculated using the CKD EPI equation. This calculation has not been validated in all clinical situations. eGFR's persistently <60 mL/min signify possible Chronic Kidney Disease.    Anion gap 21 (H) 5 - 15  Glucose, capillary     Status: Abnormal   Collection Time: 06/19/15  4:47 PM  Result Value Ref Range   Glucose-Capillary 342 (H) 65 - 99 mg/dL   Comment 1 Notify RN    Comment 2 Document in Chart   Glucose, capillary     Status: Abnormal   Collection Time: 06/19/15  7:06 PM  Result Value Ref Range   Glucose-Capillary 319 (  H) 65 - 99 mg/dL   Comment 1 Notify RN     Ct Abdomen Pelvis Wo Contrast  06/19/2015  CLINICAL DATA:  8 YOM PRESENTS WITH SMALL BOWEL OBSTRUCTION EXAM: CT ABDOMEN AND PELVIS WITHOUT CONTRAST TECHNIQUE: Multidetector CT imaging of the abdomen and pelvis was performed following the standard protocol without IV contrast. COMPARISON:  06/19/2015 plain films FINDINGS: Lower chest: Bilateral small pleural effusions and bibasilar atelectasis. Nasogastric tube in place. Hiatal hernia noted. Upper abdomen: Scalloped contour of the liver noted. No focal  abnormality identified within the liver, spleen, pancreas. Adrenal glands are diffusely mildly prominent. No focal lesions are identified however. No focal abnormality identified within the kidneys. No hydronephrosis. Gallbladder is present. Gastrointestinal tract: Stomach is mildly distended and contains contrast. The ligament of Treitz is inferiorly positioned, consistent partial malrotation. The proximal small bowel loops are dilated with mild tapering in the left mid abdomen to normal caliber. The distal small bowel loops are normal in caliber. The appendix has a normal appearance. Small amounts of ascites. There is abnormal appearance of the cecum. There is a small amount of contrast in the cecal tip. However, the wall of the ascending colon appears very thickened and there is associated stranding within the mesentery. There is thickening of the right peritoneal reflection adjacent to the colon. The findings are suspicious for neoplasm. Other considerations include typhlitis or colitis. Contrast is seen beyond this level to normal appearing hepatic flexure and distal colon. Pelvis: Urinary bladder contains Foley catheter. Prostatic seeds are noted. Seminal vesicles have a normal appearance. There is moderate stool within the sigmoid and rectum. No free pelvic fluid. Retroperitoneum: There is moderate atherosclerosis of the abdominal aorta. No retroperitoneal or mesenteric adenopathy. Abdominal wall: Small left inguinal hernia contains ascites. Osseous structures: Mild degenerative changes are seen in the spine. IMPRESSION: 1. Partial small bowel obstruction, best localized to the left mid abdomen. 2. Abnormal appearance of the cecum and ascending colon. Findings are suspicious for malignancy. Other considerations include typhlitis or colitis. 3. Small bilateral pleural effusions.  Hiatal hernia. 4. Cirrhotic contour of the liver. 5. Partial malrotation of the small bowel. 6. Prostatic seeds. 7. Abdominal aortic  atherosclerosis. Electronically Signed   By: Nolon Nations M.D.   On: 06/19/2015 17:46   US Renal  06/18/2015  CLINICAL DATA:  Acute onset of renal failure.  Initial encounter. EXAM: RENAL / URINARY TRACT ULTRASOUND COMPLETE COMPARISON:  None. FINDINGS: Right Kidney: Length: 10.2 cm. Increased parenchymal echogenicity noted. A small 1.1 cm cyst is noted at the interpole region of the right kidney. No hydronephrosis visualized. Left Kidney: Length: 11.9 cm. Mildly increased parenchymal echogenicity noted. No mass or hydronephrosis visualized. Bladder: Decompressed and not well assessed. IMPRESSION: 1. No evidence of hydronephrosis. 2. Increased renal parenchymal echogenicity likely reflects medical renal disease. 3. Small right renal cyst noted. Electronically Signed   By: Garald Balding M.D.   On: 06/18/2015 00:41   Dg Chest Port 1 View  06/19/2015  CLINICAL DATA:  Nasogastric tube and hemodialysis catheter placement EXAM: PORTABLE CHEST 1 VIEW COMPARISON:  Portable exam 1551 hours compared to 06/17/2015 FINDINGS: Nasogastric tube present extending into stomach. RIGHT jugular central venous catheter with tip projecting over SVC. Borderline enlargement of cardiac silhouette with slight vascular congestion. Atherosclerotic calcification at aortic arch. Lungs clear. No pleural effusion or pneumothorax. IMPRESSION: No acute abnormalities. Electronically Signed   By: Lavonia Dana M.D.   On: 06/19/2015 16:11   Dg Shoulder Right Port  06/18/2015  CLINICAL DATA:  Patient with diffuse right shoulder pain. No known injury. EXAM: PORTABLE RIGHT SHOULDER - 2+ VIEW COMPARISON:  Chest radiograph 06/17/2015 FINDINGS: Limited evaluation due to inability to position patient. No evidence for displaced fracture. AC joint degenerative changes. Visualized right hemi thorax is unremarkable. IMPRESSION: Limited exam due to difficulty with patient positioning. Within the above limitation, no evidence for displaced fracture.  Recommend evaluation with standard radiographic views when patient clinically able. Electronically Signed   By: Lovey Newcomer M.D.   On: 06/18/2015 13:28   Dg Abd Portable 1v  06/19/2015  CLINICAL DATA:  Nasogastric tube placement EXAM: PORTABLE ABDOMEN - 1 VIEW COMPARISON:  Study obtained earlier in the day FINDINGS: Nasogastric tube tip and side port are in the stomach. Stomach is no longer distended with air. There are multiple loops of dilated small bowel, concerning for a degree of obstruction. No free air is seen on this supine examination. IMPRESSION: Nasogastric tube tip and side port in stomach. Stomach no longer distended with air. There remain loops of dilated small bowel, concerning for a degree of obstruction. Electronically Signed   By: Lowella Grip III M.D.   On: 06/19/2015 16:14   Dg Abd Portable 1v  06/19/2015  CLINICAL DATA:  Abdominal distension EXAM: PORTABLE ABDOMEN - 1 VIEW COMPARISON:  06/17/2015 FINDINGS: Interval significant distension of the stomach is seen. Increasing small bowel dilatation is noted. Persisting colonic air is noted consistent with a partial small bowel obstruction. Prostate brachytherapy seeds are noted. No free air is noted. IMPRESSION: Increasing distension of the small bowel and stomach as described. Decompression is recommended. Electronically Signed   By: Inez Catalina M.D.   On: 06/19/2015 10:54    ROS Blood pressure 125/72, pulse 94, temperature 99.3 F (37.4 C), temperature source Oral, resp. rate 14, height 6' 2" (1.88 m), weight 96.7 kg (213 lb 3 oz), SpO2 96 %. Physical Exam  Vitals reviewed. Constitutional: He is oriented to person, place, and time. He appears well-nourished.  HENT:  Head: Normocephalic and atraumatic.  Eyes: Conjunctivae are normal. Pupils are equal, round, and reactive to light.  Neck: Normal range of motion. Neck supple.  Cardiovascular: Normal rate, regular rhythm and normal heart sounds.   Respiratory: Breath sounds  normal.  GI: Soft. He exhibits distension. He exhibits no shifting dullness, no pulsatile liver, no fluid wave, no abdominal bruit, no ascites, no pulsatile midline mass and no mass. Bowel sounds are decreased. There is no tenderness. There is no rigidity, no rebound and no guarding.  Distended but otherwise benign  Musculoskeletal: Normal range of motion.  Neurological: He is alert and oriented to person, place, and time. He has normal reflexes.  Skin: Skin is warm and dry.  Psychiatric: He has a normal mood and affect. His behavior is normal. Judgment and thought content normal.    Assessment/Plan: Patient with interesting findings on CT, but a clinical examination not consistent with ischemic or dead bowel.  If he is rotated or obstructed, this would be unusual without prior surgery, and to have malrotation or 70+ years without prior manifestation prior to this event would be also unusual.  I suspect that hw has developed these changes from low flow during his down time that has led to his renal dysfunction and failure.  NGT decompression is the most appropriate management now.  Unless he should develop peritonitis, I would not consider surgery with all the other problems going on (i.e. Elevated troponin, renal failure, gangrene of his  feet.)  Yarethzy Croak 06/19/2015, 9:19 PM

## 2015-06-19 NOTE — Progress Notes (Signed)
Pharmacy Antibiotic Follow-up Note  David Ford is a 74 y.o. year-old male admitted on 06/17/2015. The patient is currently on day 2 of Zosyn and vancomycin for black gangrenous changes of L great toe and 2nd toe;scheduled for sx/amputation today. Admitted with AKI but has become anuric over last 24 hours; nephrology following.  Assessment/Plan: 1. Continue vancomycin at current dose of 1 gram IV q 48 hours for now. If renal fxn/UOP does not improve will likely discontinue scheduled doses and order level to help guide further dosing needs 2. Zosyn 2.25 grams IV Q8H appropriate for present renal fxn 3. Await cx data, op report and narrow abx as feasible   Recent Labs Lab 06/17/15 1708 06/17/15 2235 06/18/15 0408  WBC 16.2* 16.1* 12.9*    Recent Labs Lab 06/18/15 0408 06/18/15 0635 06/18/15 1100 06/18/15 1258 06/18/15 1815  CREATININE 4.75* 4.94* 5.32* 5.60* 6.05*   Estimated CrCl: < 20 ml/min  No Known Allergies  Antimicrobials this admission: 12/28 Zosyn >>  12/28 Vancomycin >>   Levels/dose changes this admission: n/a  Microbiology results: 12/29 UCx: px 12/30  MRSA PCR: neg  Thank you for allowing pharmacy to be a part of this patient's care.  Vincenza Hews, PharmD, BCPS 06/19/2015, 9:25 AM Pager: [phone]

## 2015-06-19 NOTE — Progress Notes (Signed)
Inpatient Diabetes Program Recommendations  AACE/ADA: New Consensus Statement on Inpatient Glycemic Control (2015)  Target Ranges:  Prepandial:   less than 140 mg/dL      Peak postprandial:   less than 180 mg/dL (1-2 hours)      Critically ill patients:  140 - 180 mg/dL   Review of Glycemic Control    Inpatient Diabetes Program Recommendations: Recommend basal Lantus or Levemir even when NPO.  A1C >14%. Thank you  Raoul Pitch BSN, RN,CDE Inpatient Diabetes Coordinator [phone] (team pager)

## 2015-06-19 NOTE — Progress Notes (Signed)
Patient ID: David Ford, male   DOB: [birth year], 74 y.o.   MRN: [id number] Surgery canceled due to medical complications. We will plan for surgery left foot gangrene 1 patient is stabilized.

## 2015-06-19 NOTE — Progress Notes (Addendum)
[facility] PROGRESS NOTE  David Ford [id number] DOB: Mar 20, 1941 DOA: 06/17/2015 PCP: No primary care provider on file.  Brief Summary  David Ford is a 74 y.o. male with history of diabetes mellitus with noncompliance, memory loss, hypertension.  Patient is a poor historian and gives a different history each time he is asked about the events preceding admission. Patient lives at home alone.  He was found by a friend on the floor who called EMS.  His daughter reported that he had had swelling and darkness of the toes on his left foot for several months.  When she saw his toes during this admission, however, they appeared much more swollen and malodorous than before. Emergency department he was found to have evidence of rhabdomyolysis with CKs 3053, his initial lab work showing creatinine of 4 potassium was 6.0 and an gap was 16.  He was unable to produce urine. Troponin slightly elevated to 0.05 albumin 2.9, white blood cell, was noted to be elevated 16.2.  Hospitalist was called for admission for dehydration rhabdomyolysis evidence of acute renal failure.  12/29:  Cardiology recommends outpatient lexiscan 12/30:  Anuric, plan to start HD.  SBO with GIB > GI consulted.  Transfer to stepdown.  Toe amputation deferred   Assessment/Plan  Sepsis due to diabetic foot ulcer with gangrene (tachycardia and leukocytosis) -  Continue vancomycin and zosyn -  Dr. Sharol Given assistance appreciated > will need to defer surgery  SBO -  NPO with IVF and IV medications -  NG to LIS -  CT scan ab/pelvis  Upper GIB:  Suspect some gastritis secondary to uremia, ASA and possibly some trauma from NG insertion -  Start PPI -  Type and screen -  Cycle H&H -  GI consult   Anuric AKI, BUN and creatinine trending up, likely due to ACEI, progressive diabetic nephropathy, mild rhabdomyoloysis. -  Appreciate nephrology assistance -  PCCM consulted for temporary dialysis catheter -  Plan to start HD in  next 24-48h  Probable demand ischemia with mildly elevated troponin in setting of renal failure and infection -  ECHO:  50-55% EF with mild to mod LVH, no regional wall motion abnl, grade 1 DD -  Cardiology recommending outpatient work up -  Cycle troponins again in setting of worsening illness, high risk for ACS -  Repeat ECG  Mild gap metabolic acidosis likely secondary to RTA.   -  Bicarb added to IVF -  Repeat BMP in AM  Hyponatremia due to AKI, stable asymptomatic -  To start HD  Hyperkalemia secondary to AKI, resolved, but borderline high -  To start HD  Hypertension, blood pressures low normal to mildly hypotensive -  Continue to hold blood pressure medications  Leukocytosis due to infection and gangrene -  Trend while on antibiotics  Diabetes mellitus type 2, A1c 14.2 -  Start SSI q6h while NPO -  Increase to 15 units lantus   Bilateral shoulder pain, seems to be impingement or rotator cuff injury.  Exam not suggestive of intraarticular problem -  XR right shoulder:  No fracture, but difficult exam  -  Consider MRI as outpatient  Hx of prostate cancer, seed implantation  Leukocytosis due to gangrene, SBO, worsening -  Trend WBC  Microcytic anemia -  Iron studies, B12, folate -  TSH -  Repeat hgb in AM  Diet:  NPO Access:  PIV IVF:  off Proph:  SCDs  Code Status: full Family Communication: patient and his daughter Disposition  Plan: transfer to stepdown due to GIB, SBO, acute renal failure needing HD catheter, demand ischemia, sepsis with gangrenous foot    Consultants:  Orthopedic surgery  Nephrology  Cardiology  Procedures:  RUS  ECHO  CT abd/pelvis  Antibiotics:  Zosyn 12/28 >  Vanc 12/29 >   HPI/Subjective:  Has severe lower abdominal pain with abdominal distension.  No BM since admission and feels uncomfortable.  Denies chest pain, difficulty breathing.      Objective: Filed Vitals:   06/18/15 2133 06/19/15 0343 06/19/15 1000  06/19/15 1253  BP: 115/59 147/68 135/58 137/76  Pulse: 85 93 91 95  Temp: 98.7 F (37.1 C) 98.4 F (36.9 C) 98.6 F (37 C) 98.9 F (37.2 C)  TempSrc:   Oral Oral  Resp: 18 20 18    Height:      Weight: 90.719 kg (200 lb)     SpO2: 96% 94% 99% 96%    Intake/Output Summary (Last 24 hours) at 06/19/15 1259 Last data filed at 06/19/15 1255  Gross per 24 hour  Intake    930 ml  Output    800 ml  Net    130 ml   Filed Weights   06/17/15 2100 06/18/15 2133  Weight: 97.977 kg (216 lb) 90.719 kg (200 lb)   Body mass index is 25.67 kg/(m^2).  Exam:   General:  Adult male, No acute distress  HEENT:  NCAT, MMM  Cardiovascular:  RRR, nl S1, S2 no mrg, warm extremities  Respiratory:  CTAB, no increased WOB but mildly tachypneic  Abdomen:   Hypoactive BS, severely distended, TTP diffusely without rebound or guarding.  tympanic  MSK:   Normal tone and bulk, left foot swollen, malodorous, darkening of 1st and second toes with somewhat purulent discharge from between toes.  2+ bilateral pedal pulses  Data Reviewed: Basic Metabolic Panel:  Recent Labs Lab 06/17/15 2235  06/18/15 0408 06/18/15 0635 06/18/15 1100 06/18/15 1258 06/18/15 1815 06/19/15 0910  NA 133*  < > 133* 130* 131* 131* 127* 127*  K 6.3*  < > 5.0 4.7 4.9 5.1 5.0 5.0  CL 95*  < > 98* 98* 97* 95* 92* 86*  CO2 23  < > 23 21* 19* 22 20* 19*  GLUCOSE 332*  < > 179* 154* 233* 242* 251* 340*  BUN 64*  < > 66* 67* 70* 72* 74* 91*  CREATININE 4.18*  < > 4.75* 4.94* 5.32* 5.60* 6.05* 7.45*  CALCIUM 9.1  < > 8.9 8.5* 8.7* 8.6* 8.1* 8.3*  MG 2.2  --  2.2  --   --   --   --   --   PHOS 6.0*  --  5.7*  --   --   --   --  8.3*  < > = values in this interval not displayed. Liver Function Tests:  Recent Labs Lab 06/17/15 1708 06/18/15 0408 06/19/15 0910  AST 73* 68*  --   ALT 29 28  --   ALKPHOS 91 91  --   BILITOT 0.6 0.5  --   PROT 6.6 5.8*  --   ALBUMIN 2.9* 2.6* 2.2*   No results for input(s): LIPASE,  AMYLASE in the last 168 hours. No results for input(s): AMMONIA in the last 168 hours. CBC:  Recent Labs Lab 06/17/15 1708 06/17/15 2235 06/18/15 0408 06/19/15 0910  WBC 16.2* 16.1* 12.9* 16.4*  HGB 11.4* 12.1* 11.2* 11.9*  HCT 35.1* 37.0* 34.4* 36.1*  MCV 84.4 85.5 85.4 83.2  PLT 208 196 208 233    Recent Results (from the past 240 hour(s))  Urine culture     Status: None   Collection Time: 06/18/15  1:18 AM  Result Value Ref Range Status   Specimen Description URINE, CATHETERIZED  Final   Special Requests NONE  Final   Culture NO GROWTH 1 DAY  Final   Report Status 06/19/2015 FINAL  Final  Surgical pcr screen     Status: None   Collection Time: 06/19/15  5:25 AM  Result Value Ref Range Status   MRSA, PCR NEGATIVE NEGATIVE Final   Staphylococcus aureus NEGATIVE NEGATIVE Final    Comment:        The Xpert SA Assay (FDA approved for NASAL specimens in patients over 5 years of age), is one component of a comprehensive surveillance program.  Test performance has been validated by [facility] for patients greater than or equal to 27 year old. It is not intended to diagnose infection nor to guide or monitor treatment.      Studies: Dg Chest 2 View  06/17/2015  CLINICAL DATA:  Weakness. EXAM: CHEST  2 VIEW COMPARISON:  None. FINDINGS: Trachea is midline. Heart size within normal limits. Lungs are clear. No pleural fluid. Degenerative changes are seen in the spine. IMPRESSION: No acute findings. Electronically Signed   By: Lorin Picket M.D.   On: 06/17/2015 18:10   Dg Abd 1 View  06/17/2015  CLINICAL DATA:  Abdominal distention EXAM: ABDOMEN - 1 VIEW COMPARISON:  None. FINDINGS: Brachytherapy seeds overlie the prostate. There mildly dilated small bowel loops in the central abdomen measuring up to 3.8 cm in diameter. Mild stool is seen throughout the colon. No evidence of pneumatosis or pneumoperitoneum. No pathologic soft tissue calcifications. IMPRESSION: Mildly  dilated small bowel loops in the central abdomen, for which the differential includes a mid to distal small bowel obstruction or adynamic ileus. Electronically Signed   By: Ilona Sorrel M.D.   On: 06/17/2015 21:12   US Renal  06/18/2015  CLINICAL DATA:  Acute onset of renal failure.  Initial encounter. EXAM: RENAL / URINARY TRACT ULTRASOUND COMPLETE COMPARISON:  None. FINDINGS: Right Kidney: Length: 10.2 cm. Increased parenchymal echogenicity noted. A small 1.1 cm cyst is noted at the interpole region of the right kidney. No hydronephrosis visualized. Left Kidney: Length: 11.9 cm. Mildly increased parenchymal echogenicity noted. No mass or hydronephrosis visualized. Bladder: Decompressed and not well assessed. IMPRESSION: 1. No evidence of hydronephrosis. 2. Increased renal parenchymal echogenicity likely reflects medical renal disease. 3. Small right renal cyst noted. Electronically Signed   By: Garald Balding M.D.   On: 06/18/2015 00:41   Dg Shoulder Right Port  06/18/2015  CLINICAL DATA:  Patient with diffuse right shoulder pain. No known injury. EXAM: PORTABLE RIGHT SHOULDER - 2+ VIEW COMPARISON:  Chest radiograph 06/17/2015 FINDINGS: Limited evaluation due to inability to position patient. No evidence for displaced fracture. AC joint degenerative changes. Visualized right hemi thorax is unremarkable. IMPRESSION: Limited exam due to difficulty with patient positioning. Within the above limitation, no evidence for displaced fracture. Recommend evaluation with standard radiographic views when patient clinically able. Electronically Signed   By: Lovey Newcomer M.D.   On: 06/18/2015 13:28   Dg Abd Portable 1v  06/19/2015  CLINICAL DATA:  Abdominal distension EXAM: PORTABLE ABDOMEN - 1 VIEW COMPARISON:  06/17/2015 FINDINGS: Interval significant distension of the stomach is seen. Increasing small bowel dilatation is noted. Persisting colonic air is noted consistent with  a partial small bowel obstruction.  Prostate brachytherapy seeds are noted. No free air is noted. IMPRESSION: Increasing distension of the small bowel and stomach as described. Decompression is recommended. Electronically Signed   By: Inez Catalina M.D.   On: 06/19/2015 10:54   Dg Foot Complete Left  06/17/2015  CLINICAL DATA:  Unable to walk.  Foot pain.  Diabetes. EXAM: LEFT FOOT - COMPLETE 3+ VIEW COMPARISON:  None. FINDINGS: Questionable soft tissue ulceration at the tips of the first and second toes. 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 No visible radiographic changes of acute osteomyelitis. No fracture, subluxation or dislocation. IMPRESSION: No radiographic changes of osteomyelitis. Electronically Signed   By: Rolm Baptise M.D.   On: 06/17/2015 18:11    Scheduled Meds: . Barium Sulfate      . bisacodyl  10 mg Rectal Once  . insulin aspart  0-9 Units Subcutaneous 6 times per day  . pantoprazole (PROTONIX) IV  80 mg Intravenous Once  . [START ON 06/23/2015] pantoprazole (PROTONIX) IV  40 mg Intravenous Q12H  . piperacillin-tazobactam (ZOSYN)  IV  2.25 g Intravenous Q8H  . sodium chloride  3 mL Intravenous Q12H  . vancomycin  1,000 mg Intravenous Q48H   Continuous Infusions: . pantoprozole (PROTONIX) infusion      Active Problems:   Hypertension   Leukocytosis   Elevated troponin   Hyponatremia   Hypoalbuminemia   Hyperkalemia   Rhabdomyolysis   Metabolic acidosis   Acute renal failure (ARF) ([place])   DM type 2 causing renal disease ([place])   Dehydration   Debility   Diabetic foot infection ([place])   Ileus ([place])    Time spent: 30 min    David Ford, [place] [facility] Pager [phone]. If 7PM-7AM, please contact night-coverage at [email or web address], password [facility] 06/19/2015, 12:59 PM  LOS: 2 days

## 2015-06-19 NOTE — Progress Notes (Signed)
OT Cancellation Note  Patient Details Name: David Ford MRN: [id number] DOB: 02/06/1941   Cancelled Treatment:    Reason Eval/Treat Not Completed: Medical issues which prohibited therapy. Scheduled for surgery today which was cancelled due to medical complications. Pt transferred to step down unit/having catheter placed. Pt assess when medically appropriate.   [place], OTR/L  [id number] 06/19/2015 06/19/2015, 3:27 PM

## 2015-06-19 NOTE — Procedures (Signed)
Central Venous Catheter Insertion Procedure Note David Ford [id number] Oct 29, 1940  Procedure: Insertion of Central Venous Catheter Indications: Assessment of intravascular volume, Drug and/or fluid administration, Frequent blood sampling and Hemodialysis  Procedure Details Consent: Risks of procedure as well as the alternatives and risks of each were explained to the (patient/caregiver).  Consent for procedure obtained. Time Out: Verified patient identification, verified procedure, site/side was marked, verified correct patient position, special equipment/implants available, medications/allergies/relevent history reviewed, required imaging and test results available.  Performed  Maximum sterile technique was used including antiseptics, cap, gloves, gown, hand hygiene, mask and sheet. Skin prep: Chlorhexidine; local anesthetic administered A antimicrobial bonded/coated triple lumen catheter was placed in the right internal jugular vein using the Seldinger technique.  Evaluation Blood flow good Complications: No apparent complications Patient did tolerate procedure well. Chest X-Ford ordered to verify placement.  CXR: pending.  Procedure performed under direct supervision of Dr. Halford Chessman. Ultrasound utilized for realtime vessel cannulation   David Ford S. [facility] ANP-BC Pulmonary and Critical Care Medicine [facility] Pager: [phone] 06/19/2015, 2:52 PM

## 2015-06-20 ENCOUNTER — Inpatient Hospital Stay (HOSPITAL_COMMUNITY): Payer: Medicare HMO

## 2015-06-20 DIAGNOSIS — D72829 Elevated white blood cell count, unspecified: Secondary | ICD-10-CM

## 2015-06-20 LAB — CBC
HEMATOCRIT: 31.3 % — AB (ref 39.0–52.0)
Hemoglobin: 10.9 g/dL — ABNORMAL LOW (ref 13.0–17.0)
MCH: 27.8 pg (ref 26.0–34.0)
MCHC: 34.8 g/dL (ref 30.0–36.0)
MCV: 79.8 fL (ref 78.0–100.0)
PLATELETS: 222 10*3/uL (ref 150–400)
RBC: 3.92 MIL/uL — ABNORMAL LOW (ref 4.22–5.81)
RDW: 13.7 % (ref 11.5–15.5)
WBC: 19 10*3/uL — AB (ref 4.0–10.5)

## 2015-06-20 LAB — RENAL FUNCTION PANEL
ANION GAP: 17 — AB (ref 5–15)
Albumin: 2 g/dL — ABNORMAL LOW (ref 3.5–5.0)
Albumin: 2 g/dL — ABNORMAL LOW (ref 3.5–5.0)
Anion gap: 18 — ABNORMAL HIGH (ref 5–15)
BUN: 111 mg/dL — ABNORMAL HIGH (ref 6–20)
BUN: 62 mg/dL — ABNORMAL HIGH (ref 6–20)
CHLORIDE: 93 mmol/L — AB (ref 101–111)
CO2: 24 mmol/L (ref 22–32)
CO2: 24 mmol/L (ref 22–32)
Calcium: 8.3 mg/dL — ABNORMAL LOW (ref 8.9–10.3)
Calcium: 8.2 mg/dL — ABNORMAL LOW (ref 8.9–10.3)
Chloride: 89 mmol/L — ABNORMAL LOW (ref 101–111)
Creatinine, Ser: 8.96 mg/dL — ABNORMAL HIGH (ref 0.61–1.24)
Creatinine, Ser: 6.45 mg/dL — ABNORMAL HIGH (ref 0.61–1.24)
GFR calc Af Amer: 6 mL/min — ABNORMAL LOW (ref >60)
GFR calc non Af Amer: 5 mL/min — ABNORMAL LOW (ref >60)
GFR calc non Af Amer: 8 mL/min — ABNORMAL LOW (ref >60)
GFR, EST AFRICAN AMERICAN: 9 mL/min — AB (ref >60)
Glucose, Bld: 251 mg/dL — ABNORMAL HIGH (ref 65–99)
Glucose, Bld: 152 mg/dL — ABNORMAL HIGH (ref 65–99)
Phosphorus: 7.3 mg/dL — ABNORMAL HIGH (ref 2.5–4.6)
Phosphorus: 5.3 mg/dL — ABNORMAL HIGH (ref 2.5–4.6)
Potassium: 4.3 mmol/L (ref 3.5–5.1)
Potassium: 3.8 mmol/L (ref 3.5–5.1)
Sodium: 131 mmol/L — ABNORMAL LOW (ref 135–145)
Sodium: 134 mmol/L — ABNORMAL LOW (ref 135–145)

## 2015-06-20 LAB — GLUCOSE, CAPILLARY
GLUCOSE-CAPILLARY: 137 mg/dL — AB (ref 65–99)
Glucose-Capillary: 293 mg/dL — ABNORMAL HIGH (ref 65–99)
Glucose-Capillary: 269 mg/dL — ABNORMAL HIGH (ref 65–99)
Glucose-Capillary: 202 mg/dL — ABNORMAL HIGH (ref 65–99)
Glucose-Capillary: 151 mg/dL — ABNORMAL HIGH (ref 65–99)

## 2015-06-20 LAB — FERRITIN: FERRITIN: 332 ng/mL (ref 24–336)

## 2015-06-20 LAB — FOLATE: Folate: 24.4 ng/mL (ref >5.9)

## 2015-06-20 LAB — IRON AND TIBC
IRON: 17 ug/dL — AB (ref 45–182)
Saturation Ratios: 11 % — ABNORMAL LOW (ref 17.9–39.5)
TIBC: 160 ug/dL — AB (ref 250–450)
UIBC: 143 ug/dL

## 2015-06-20 LAB — HEMOGLOBIN AND HEMATOCRIT, BLOOD
HCT: 33.1 % — ABNORMAL LOW (ref 39.0–52.0)
Hemoglobin: 11.3 g/dL — ABNORMAL LOW (ref 13.0–17.0)

## 2015-06-20 LAB — TROPONIN I
TROPONIN I: 0.06 ng/mL — AB (ref <0.031)
TROPONIN I: 0.06 ng/mL — AB (ref <0.031)

## 2015-06-20 LAB — VITAMIN B12: Vitamin B-12: 963 pg/mL — ABNORMAL HIGH (ref 180–914)

## 2015-06-20 LAB — PSA: PSA: 0.07 ng/mL (ref 0.00–4.00)

## 2015-06-20 LAB — TSH: TSH: 0.653 u[IU]/mL (ref 0.350–4.500)

## 2015-06-20 LAB — AMMONIA: Ammonia: 21 umol/L (ref 9–35)

## 2015-06-20 MED ORDER — SODIUM CHLORIDE 0.9 % IV SOLN
510.0000 mg | Freq: Once | INTRAVENOUS | Status: AC
  Administered 2015-06-20: 510 mg via INTRAVENOUS
  Filled 2015-06-20: qty 17

## 2015-06-20 MED ORDER — ALTEPLASE 2 MG IJ SOLR: 2.0000 mg | Freq: Once | INTRAMUSCULAR | Status: DC | PRN

## 2015-06-20 MED ORDER — HEPARIN SODIUM (PORCINE) 1000 UNIT/ML DIALYSIS: 1000.0000 [IU] | INTRAMUSCULAR | Status: DC | PRN

## 2015-06-20 MED ORDER — VANCOMYCIN HCL IN DEXTROSE 1-5 GM/200ML-% IV SOLN
1000.0000 mg | Freq: Once | INTRAVENOUS | Status: AC
  Administered 2015-06-20: 1000 mg via INTRAVENOUS
  Filled 2015-06-20: qty 200

## 2015-06-20 MED ORDER — DIPHENHYDRAMINE HCL 50 MG/ML IJ SOLN
12.5000 mg | Freq: Once | INTRAMUSCULAR | Status: AC
  Administered 2015-06-20: 12.5 mg via INTRAVENOUS
  Filled 2015-06-20: qty 1

## 2015-06-20 MED ORDER — SODIUM CHLORIDE 0.9 % IV SOLN: 100.0000 mL | INTRAVENOUS | Status: DC | PRN

## 2015-06-20 MED ORDER — DIPHENHYDRAMINE HCL 50 MG/ML IJ SOLN: 12.5000 mg | Freq: Every evening | INTRAMUSCULAR | Status: DC | PRN

## 2015-06-20 MED ORDER — SODIUM CHLORIDE 0.9 % IV BOLUS (SEPSIS)
250.0000 mL | Freq: Once | INTRAVENOUS | Status: AC
  Administered 2015-06-20: 250 mL via INTRAVENOUS

## 2015-06-20 MED ORDER — ZOLPIDEM TARTRATE 5 MG PO TABS: 5.0000 mg | ORAL_TABLET | Freq: Every evening | ORAL | Status: DC | PRN

## 2015-06-20 MED ORDER — METOPROLOL TARTRATE 1 MG/ML IV SOLN
INTRAVENOUS | Status: AC
  Filled 2015-06-20: qty 5

## 2015-06-20 MED ORDER — HEPARIN (PORCINE) IN NACL 100-0.45 UNIT/ML-% IJ SOLN
1450.0000 [IU]/h | INTRAMUSCULAR | Status: DC
  Administered 2015-06-20 – 2015-06-22 (×3): 1450 [IU]/h via INTRAVENOUS
  Filled 2015-06-20 (×3): qty 250

## 2015-06-20 MED ORDER — METOPROLOL TARTRATE 1 MG/ML IV SOLN
5.0000 mg | Freq: Four times a day (QID) | INTRAVENOUS | Status: DC | PRN
  Administered 2015-06-20: 5 mg via INTRAVENOUS

## 2015-06-20 NOTE — Progress Notes (Signed)
Pt returned from dialysis with elevated heart rate ranging from 108-130, all other vital signs WDL. Dr. Sheran Fava called new orders obtained. Will implement and continue to monitor.

## 2015-06-20 NOTE — Progress Notes (Signed)
Pharmacy Antibiotic Follow-up Note David Ford is a 74 y.o. year-old male admitted on 06/17/2015. The patient is currently on day 3 of Zosyn and vancomycin for black gangrenous changes of L great toe and 2nd toe. Renal fxn continues to decline since admission and will under go IHD today due to uremia. Sx on hold at this time.   Assessment/Plan: 1. Discontinue scheduled vancomycin; will give vancomycin 1 gram post IHD today as pt has essentially received only a loading dose up till this point. 2. F/u IHD toleration, renal function and order levels/susequent vancomycin doses as needed  3. Zosyn 2.25 grams IV Q8H appropriate for present renal fxn 4. Await cx data, op report and narrow abx as feasible   Recent Labs Lab 06/17/15 2235 06/18/15 0408 06/19/15 0910 06/19/15 1430 06/20/15 0425  WBC 16.1* 12.9* 16.4* 15.1* 19.0*     Recent Labs Lab 06/18/15 1815 06/19/15 0910 06/19/15 1430 06/19/15 1543 06/20/15 0425  CREATININE 6.05* 7.45* 8.15* 8.17* 8.96*   Estimated CrCl: < 20 ml/min  No Known Allergies  Antimicrobials this admission: 12/28 Zosyn >>  12/28 Vancomycin >>   Levels/dose changes this admission: n/a  Microbiology results: 12/29 UCx: ngF 12/30  MRSA PCR: neg  Thank you for allowing pharmacy to be a part of this patient's care.  Vincenza Hews, PharmD, BCPS 06/20/2015, 12:10 PM Pager: [phone]

## 2015-06-20 NOTE — Clinical Social Work Note (Signed)
Clinical Social Work Assessment  Patient Details  Name: David Ford MRN: [id number] Date of Birth: July 27, 1940  Date of referral:  06/20/15               Reason for consult:  Facility Placement                Permission sought to share information with:  Facility Sport and exercise psychologist, Family Supports Permission granted to share information::  Yes, Verbal Permission Granted  Name::     daughter and son  Agency::  [facility]  Relationship::  children  Contact Information:     Housing/Transportation Living arrangements for the past 2 months:  [place] of Information:  Patient, Adult Children Patient Interpreter Needed:  None Criminal Activity/Legal Involvement Pertinent to Current Situation/Hospitalization:  No - Comment as needed Significant Relationships:  Adult Children Lives with:  Self Do you feel safe going back to the place where you live?  Yes Need for family participation in patient care:  No (Coment)  Care giving concerns:  None, at this time   Facilities manager / plan:  Met with Pt, daughter and son at the bedside.  Pt, daughter and son in agreement that SNF will be in order upon d/c.  Pt lives alone and several steps in the home and, due to his impending amputation, it is likely that he will have difficulty getting up and down steps without rehab.    Employment status:  Retired Forensic scientist:  Managed Care PT Recommendations:  Not assessed at this time Information / Referral to community resources:   SNF list  Patient/Family's Response to care:  Pt and family agree that SNF is the best d/c option, at this time.  Daughter doesn't feel that Pt is ready to return home, as he lives alone.  Patient/Family's Understanding of and Emotional Response to Diagnosis, Current Treatment, and Prognosis: Everyone feels good about SNF as the primary d/c plan.  Emotional Assessment Appearance:  Appears stated  age Attitude/Demeanor/Rapport:   (cooperative) Affect (typically observed):  Accepting Orientation:  Oriented to Self, Oriented to Place, Oriented to  Time, Oriented to Situation Alcohol / Substance use:  Never Used Psych involvement (Current and /or in the community):  No (Comment)  Discharge Needs  Concerns to be addressed:  No discharge needs identified Readmission within the last 30 days:  No Current discharge risk:  None Barriers to Discharge:  No Barriers Identified   Matilde Bash, [place] 06/20/2015, 10:50 AM

## 2015-06-20 NOTE — Progress Notes (Signed)
(  no charge)  Pt not seen 06/20/2015 per advice of Dr. Sheran Fava, who indicated he was stable from GI standpoint.  Cleotis Nipper, M.D. Pager [phone] If no answer or after 5 PM call [phone]

## 2015-06-20 NOTE — Clinical Social Work Placement (Signed)
   CLINICAL SOCIAL WORK PLACEMENT  NOTE  Date:  06/20/2015  Patient Details  Name: David Ford MRN: [id number] Date of Birth: 12-27-1940  Clinical Social Work is seeking post-discharge placement for this patient at the [place] level of care (*CSW will initial, date and re-position this form in  chart as items are completed):  Yes   Patient/family provided with [place] Work Department's list of facilities offering this level of care within the geographic area requested by the patient (or if unable, by the patient's family).  Yes   Patient/family informed of their freedom to choose among providers that offer the needed level of care, that participate in Medicare, Medicaid or managed care program needed by the patient, have an available bed and are willing to accept the patient.  Yes   Patient/family informed of 's ownership interest in [facility] and [facility], as well as of the fact that they are under no obligation to receive care at these facilities.  PASRR submitted to EDS on 06/19/15     PASRR number received on 06/19/15     Existing PASRR number confirmed on       FL2 transmitted to all facilities in geographic area requested by pt/family on 06/20/15     FL2 transmitted to all facilities within larger geographic area on       Patient informed that his/her managed care company has contracts with or will negotiate with certain facilities, including the following:            Patient/family informed of bed offers received.  Patient chooses bed at       Physician recommends and patient chooses bed at      Patient to be transferred to   on  .  Patient to be transferred to facility by       Patient family notified on   of transfer.  Name of family member notified:        PHYSICIAN       Additional Comment:    _______________________________________________ Matilde Bash, [place] 06/20/2015, 10:54  AM

## 2015-06-20 NOTE — Progress Notes (Signed)
[facility] PROGRESS NOTE  David Ford [id number] DOB: 26-Jun-1940 DOA: 06/17/2015 PCP: No primary care provider on file.  Brief Summary  David Ford is a 74 y.o. male with history of diabetes mellitus with noncompliance, memory loss, hypertension.  Patient is a poor historian and gives a different history each time he is asked about the events preceding admission. Patient lives at home alone.  He was found by a friend on the floor who called EMS.  His daughter reported that he had had swelling and darkness of the toes on his left foot for several months.  When she saw his toes during this admission, however, they appeared much more swollen and malodorous than before. Emergency department he was found to have evidence of rhabdomyolysis with CKs 3053, his initial lab work showing creatinine of 4 potassium was 6.0 and an gap was 16.  He was unable to produce urine. Troponin slightly elevated to 0.05 albumin 2.9, white blood cell, was noted to be elevated 16.2.  Hospitalist was called for admission for dehydration rhabdomyolysis evidence of acute renal failure.  12/29:  Cardiology recommends outpatient lexiscan 12/30:  Anuric, plan to start HD.  SBO with GIB > GI consulted.  Transfer to stepdown.  Toe amputation deferred   Assessment/Plan  Sepsis due to diabetic foot ulcer with gangrene (tachycardia and leukocytosis) -  Continue vancomycin and zosyn -  Dr. Sharol Given assistance appreciated  Partial SBO, localized to left mid-abdomen with partial malrotation of the small bowel. -  KUB this morning shows possible full bowel obstruction -  Had large BM with bisacodyl suppository overnight -  NPO with IVF and IV medications -  NG to LIS -  Appreciate general surgery assistance, agree with attending to avoid surgery if possible -  If slow to resolve will need be CCM or interventional radiology to place a line for TNA  Upper GIB:  Suspect some gastritis secondary to uremia, ASA and possibly  some trauma from NG insertion -  Continue PPI -  Hemoglobin stable, NG tube output appears bilious -  Appreciate GI assistance  Anuric AKI, BUN and creatinine trending up, likely due to ACEI, progressive diabetic nephropathy, mild rhabdomyoloysis. -  Appreciate nephrology assistance -  PCCM consulted for temporary dialysis catheter -  Starting hemodialysis today  cirrhotic appearing liver, ammonia 21, labs not consistent with end-stage disease  Abnormal appearance of the cecum with stranding, suspicious for neoplasm vs. Colitis  -  Defer workup at this time secondary to other acute illness -  May need repeat imaging and/or colonoscopy with biopsy  Probable demand ischemia with mildly elevated troponin in setting of renal failure and infection -  ECHO:  50-55% EF with mild to mod LVH, no regional wall motion abnl, grade 1 DD -  Cardiology recommending outpatient work up -  Cycle troponins again in setting of worsening illness, high risk for ACS -  Repeat ECG  Mild gap metabolic acidosis, resolved with bicarb containing IVF  Hyponatremia due to AKI, stable asymptomatic -  To start HD  Hyperkalemia secondary to AKI, resolved, but borderline high -  To start HD  Hypertension, blood pressures low normal to mildly hypotensive -  Continue to hold blood pressure medications  Leukocytosis due to infection and gangrene and worsening, likely due to bowel obstruction -  Trend while on antibiotics  Diabetes mellitus type 2, A1c 14.2 -  Continue SSI q6h while NPO -  Trial of 15 units lantus, starting today  Bilateral shoulder pain, seems  to be impingement or rotator cuff injury.  Exam not suggestive of intraarticular problem -  XR right shoulder:  No fracture, but difficult exam  -  Consider MRI as outpatient  Hx of prostate cancer, seed implantation  Microcytic anemia -  Iron studies c/w iron deficiency, B12 963, folate wnl -  TSH 0.653  Diet:  NPO Access:  PIV IVF:   off Proph:  SCDs  Code Status: full Family Communication: patient and his daughter Disposition Plan:  Ongoing stepdown management for now.  HD to start today  Consultants:  Orthopedic surgery  Nephrology  Cardiology  Procedures:  RUS  ECHO  CT abd/pelvis  Antibiotics:  Zosyn 12/28 >  Vanc 12/29 >   HPI/Subjective:  Abdominal pain improved.  Tired.  Ready to start HD.  Bleeding slowing.  Had large BM overnight.    Objective: Filed Vitals:   06/19/15 1909 06/19/15 2254 06/20/15 0304 06/20/15 0830  BP: 125/72 129/59 119/66   Pulse: 94 94 83   Temp: 99.3 F (37.4 C) 98.4 F (36.9 C) 98.2 F (36.8 C) 98.3 F (36.8 C)  TempSrc: Oral Oral Oral Oral  Resp: 14 18 15    Height:      Weight:   96.8 kg (213 lb 6.5 oz)   SpO2: 96% 94% 95%     Intake/Output Summary (Last 24 hours) at 06/20/15 1011 Last data filed at 06/20/15 0830  Gross per 24 hour  Intake  572.5 ml  Output   1730 ml  Net -1157.5 ml   Filed Weights   06/18/15 2133 06/19/15 1341 06/20/15 0304  Weight: 90.719 kg (200 lb) 96.7 kg (213 lb 3 oz) 96.8 kg (213 lb 6.5 oz)   Body mass index is 27.39 kg/(m^2).  Exam:   General:  Adult male, No acute distress  HEENT:  NCAT, MMM  Cardiovascular:  RRR, nl S1, S2 no mrg, warm extremities  Respiratory:  CTAB, no increased WOB but mildly tachypneic  Abdomen:   Hypoactive BS, moderately distended, TTP in lower quadrants without rebound or guarding.  Much softer than yesterday  MSK:   Normal tone and bulk, left foot swollen, malodorous, darkening of 1st and second toes with somewhat purulent discharge from between toes.  2+ bilateral pedal pulses  Data Reviewed: Basic Metabolic Panel:  Recent Labs Lab 06/17/15 2235  06/18/15 0408  06/18/15 1815 06/19/15 0910 06/19/15 1430 06/19/15 1543 06/20/15 0425  NA 133*  < > 133*  < > 127* 127* 126* 129* 131*  K 6.3*  < > 5.0  < > 5.0 5.0 5.0 4.8 4.3  CL 95*  < > 98*  < > 92* 86* 86* 88* 89*  CO2 23  <  > 23  < > 20* 19* 19* 20* 24  GLUCOSE 332*  < > 179*  < > 251* 340* 367* 357* 251*  BUN 64*  < > 66*  < > 74* 91* 100* 101* 111*  CREATININE 4.18*  < > 4.75*  < > 6.05* 7.45* 8.15* 8.17* 8.96*  CALCIUM 9.1  < > 8.9  < > 8.1* 8.3* 8.1* 8.2* 8.3*  MG 2.2  --  2.2  --   --   --   --   --   --   PHOS 6.0*  --  5.7*  --   --  8.3*  --  7.1* 7.3*  < > = values in this interval not displayed. Liver Function Tests:  Recent Labs Lab 06/17/15 1708 06/18/15 0408  06/19/15 0910 06/19/15 1430 06/19/15 1543 06/20/15 0425  AST 73* 68*  --  69*  --   --   ALT 29 28  --  45  --   --   ALKPHOS 91 91  --  83  --   --   BILITOT 0.6 0.5  --  1.0  --   --   PROT 6.6 5.8*  --  5.7*  --   --   ALBUMIN 2.9* 2.6* 2.2* 2.1* 2.2* 2.0*   No results for input(s): LIPASE, AMYLASE in the last 168 hours.  Recent Labs Lab 06/20/15 0421  AMMONIA 21   CBC:  Recent Labs Lab 06/17/15 2235 06/18/15 0408 06/19/15 0910 06/19/15 1430 06/19/15 2322 06/20/15 0425  WBC 16.1* 12.9* 16.4* 15.1*  --  19.0*  HGB 12.1* 11.2* 11.9* 10.8* 11.1* 10.9*  HCT 37.0* 34.4* 36.1* 32.0* 32.0* 31.3*  MCV 85.5 85.4 83.2 82.9  --  79.8  PLT 196 208 233 214  --  222    Recent Results (from the past 240 hour(s))  Urine culture     Status: None   Collection Time: 06/18/15  1:18 AM  Result Value Ref Range Status   Specimen Description URINE, CATHETERIZED  Final   Special Requests NONE  Final   Culture NO GROWTH 1 DAY  Final   Report Status 06/19/2015 FINAL  Final  Surgical pcr screen     Status: None   Collection Time: 06/19/15  5:25 AM  Result Value Ref Range Status   MRSA, PCR NEGATIVE NEGATIVE Final   Staphylococcus aureus NEGATIVE NEGATIVE Final    Comment:        The Xpert SA Assay (FDA approved for NASAL specimens in patients over 63 years of age), is one component of a comprehensive surveillance program.  Test performance has been validated by [facility] for patients greater than or equal to 69 year  old. It is not intended to diagnose infection nor to guide or monitor treatment.      Studies: Ct Abdomen Pelvis Wo Contrast  06/19/2015  CLINICAL DATA:  47 YOM PRESENTS WITH SMALL BOWEL OBSTRUCTION EXAM: CT ABDOMEN AND PELVIS WITHOUT CONTRAST TECHNIQUE: Multidetector CT imaging of the abdomen and pelvis was performed following the standard protocol without IV contrast. COMPARISON:  06/19/2015 plain films FINDINGS: Lower chest: Bilateral small pleural effusions and bibasilar atelectasis. Nasogastric tube in place. Hiatal hernia noted. Upper abdomen: Scalloped contour of the liver noted. No focal abnormality identified within the liver, spleen, pancreas. Adrenal glands are diffusely mildly prominent. No focal lesions are identified however. No focal abnormality identified within the kidneys. No hydronephrosis. Gallbladder is present. Gastrointestinal tract: Stomach is mildly distended and contains contrast. The ligament of Treitz is inferiorly positioned, consistent partial malrotation. The proximal small bowel loops are dilated with mild tapering in the left mid abdomen to normal caliber. The distal small bowel loops are normal in caliber. The appendix has a normal appearance. Small amounts of ascites. There is abnormal appearance of the cecum. There is a small amount of contrast in the cecal tip. However, the wall of the ascending colon appears very thickened and there is associated stranding within the mesentery. There is thickening of the right peritoneal reflection adjacent to the colon. The findings are suspicious for neoplasm. Other considerations include typhlitis or colitis. Contrast is seen beyond this level to normal appearing hepatic flexure and distal colon. Pelvis: Urinary bladder contains Foley catheter. Prostatic seeds are noted. Seminal vesicles have a  normal appearance. There is moderate stool within the sigmoid and rectum. No free pelvic fluid. Retroperitoneum: There is moderate  atherosclerosis of the abdominal aorta. No retroperitoneal or mesenteric adenopathy. Abdominal wall: Small left inguinal hernia contains ascites. Osseous structures: Mild degenerative changes are seen in the spine. IMPRESSION: 1. Partial small bowel obstruction, best localized to the left mid abdomen. 2. Abnormal appearance of the cecum and ascending colon. Findings are suspicious for malignancy. Other considerations include typhlitis or colitis. 3. Small bilateral pleural effusions.  Hiatal hernia. 4. Cirrhotic contour of the liver. 5. Partial malrotation of the small bowel. 6. Prostatic seeds. 7. Abdominal aortic atherosclerosis. Electronically Signed   By: Nolon Nations M.D.   On: 06/19/2015 17:46   Dg Chest Port 1 View  06/19/2015  CLINICAL DATA:  Nasogastric tube and hemodialysis catheter placement EXAM: PORTABLE CHEST 1 VIEW COMPARISON:  Portable exam 1551 hours compared to 06/17/2015 FINDINGS: Nasogastric tube present extending into stomach. RIGHT jugular central venous catheter with tip projecting over SVC. Borderline enlargement of cardiac silhouette with slight vascular congestion. Atherosclerotic calcification at aortic arch. Lungs clear. No pleural effusion or pneumothorax. IMPRESSION: No acute abnormalities. Electronically Signed   By: Lavonia Dana M.D.   On: 06/19/2015 16:11   Dg Shoulder Right Port  06/18/2015  CLINICAL DATA:  Patient with diffuse right shoulder pain. No known injury. EXAM: PORTABLE RIGHT SHOULDER - 2+ VIEW COMPARISON:  Chest radiograph 06/17/2015 FINDINGS: Limited evaluation due to inability to position patient. No evidence for displaced fracture. AC joint degenerative changes. Visualized right hemi thorax is unremarkable. IMPRESSION: Limited exam due to difficulty with patient positioning. Within the above limitation, no evidence for displaced fracture. Recommend evaluation with standard radiographic views when patient clinically able. Electronically Signed   By: Lovey Newcomer M.D.   On: 06/18/2015 13:28   Dg Abd Portable 1v  06/20/2015  CLINICAL DATA:  Small bowel obstruction EXAM: PORTABLE ABDOMEN - 1 VIEW COMPARISON:  06/19/2015 FINDINGS: Nasogastric tube is coiled in the stomach. Stacked loops of gas-filled small bowel are present in the left abdomen, similar to prior, measuring up to 5.2 cm diameter, previously 4.8 cm diameter. No dilated colon is observed. No secondary signs of free intraperitoneal gas. IMPRESSION: 1. Slight increase in small bowel dilation, individual loops measuring up to 5.2 cm in diameter. Appearance compatible with small bowel obstruction. Electronically Signed   By: Van Clines M.D.   On: 06/20/2015 08:16   Dg Abd Portable 1v  06/19/2015  CLINICAL DATA:  Nasogastric tube placement EXAM: PORTABLE ABDOMEN - 1 VIEW COMPARISON:  Study obtained earlier in the day FINDINGS: Nasogastric tube tip and side port are in the stomach. Stomach is no longer distended with air. There are multiple loops of dilated small bowel, concerning for a degree of obstruction. No free air is seen on this supine examination. IMPRESSION: Nasogastric tube tip and side port in stomach. Stomach no longer distended with air. There remain loops of dilated small bowel, concerning for a degree of obstruction. Electronically Signed   By: Lowella Grip III M.D.   On: 06/19/2015 16:14   Dg Abd Portable 1v  06/19/2015  CLINICAL DATA:  Abdominal distension EXAM: PORTABLE ABDOMEN - 1 VIEW COMPARISON:  06/17/2015 FINDINGS: Interval significant distension of the stomach is seen. Increasing small bowel dilatation is noted. Persisting colonic air is noted consistent with a partial small bowel obstruction. Prostate brachytherapy seeds are noted. No free air is noted. IMPRESSION: Increasing distension of the small bowel and stomach  as described. Decompression is recommended. Electronically Signed   By: Inez Catalina M.D.   On: 06/19/2015 10:54    Scheduled Meds: . insulin  aspart  0-9 Units Subcutaneous 6 times per day  . insulin glargine  15 Units Subcutaneous Daily  . [START ON 06/23/2015] pantoprazole (PROTONIX) IV  40 mg Intravenous Q12H  . piperacillin-tazobactam (ZOSYN)  IV  2.25 g Intravenous Q8H  . sodium chloride  3 mL Intravenous Q12H  . vancomycin  1,000 mg Intravenous Q48H   Continuous Infusions: . pantoprozole (PROTONIX) infusion 8 mg/hr (06/19/15 2000)    Active Problems:   Hypertension   Leukocytosis   Elevated troponin   Hyponatremia   Hypoalbuminemia   Hyperkalemia   Rhabdomyolysis   Metabolic acidosis   Acute renal failure (ARF) ([place])   DM type 2 causing renal disease ([place])   Dehydration   Debility   Diabetic foot infection ([place])   Ileus ([facility])   SBO (small bowel obstruction) ([place])   Gastrointestinal hemorrhage with hematemesis    Time spent: 30 min    David Ford, [place] [facility] Pager [phone]. If 7PM-7AM, please contact night-coverage at [email or web address], password [facility] 06/20/2015, 10:11 AM  LOS: 3 days

## 2015-06-20 NOTE — Progress Notes (Signed)
Admit: 06/17/2015 LOS: 3  42M with renal insufficiency, unclear duration, mild hyperkalemia, admitted after down on ground for 3d; on ACEi; with mild rhabdomyolysis; diabetic foot ulcer  Subjective:  Has developed pSBO; seen by CCS and GI; current plan conservative with decompression CT with concern for colonic malignancy CT also noted cirrhotic contour of liver Remains anuric, GFR decline as if anephric Had hematemesis in NGT, on PPI gtt; Hb stable this AM Toe amputations postponed Family updated yesterday re renal issues PCCM placed R IJ temp HD cath eysterday, appreciate assistance Pt w/o questions today, conversant  12/30 0701 - 12/31 0700 In: 572.5 [I.V.:382.5; NG/GT:90; IV Piggyback:100] Out: [id number] [Urine:25; Emesis/NG output:1700]  Filed Weights   06/18/15 2133 06/19/15 1341 06/20/15 0304  Weight: 90.719 kg (200 lb) 96.7 kg (213 lb 3 oz) 96.8 kg (213 lb 6.5 oz)    Scheduled Meds: . insulin aspart  0-9 Units Subcutaneous 6 times per day  . insulin glargine  15 Units Subcutaneous Daily  . [START ON 06/23/2015] pantoprazole (PROTONIX) IV  40 mg Intravenous Q12H  . piperacillin-tazobactam (ZOSYN)  IV  2.25 g Intravenous Q8H  . sodium chloride  3 mL Intravenous Q12H  . vancomycin  1,000 mg Intravenous Q48H   Continuous Infusions: . pantoprozole (PROTONIX) infusion 8 mg/hr (06/19/15 2000)   PRN Meds:.dextrose, HYDROmorphone (DILAUDID) injection, ondansetron (ZOFRAN) IV  Current Labs: reviewed    Physical Exam:  Blood pressure 119/66, pulse 83, temperature 98.2 F (36.8 C), temperature source Oral, resp. rate 15, height 6\' 2"  (1.88 m), weight 96.8 kg (213 lb 6.5 oz), SpO2 95 %. GEN: NAD, elderly male lying in bed ENT: NCAT; NGT in place with bilious contents EYES: EOMI CV: RRR, no rub PULM: CTAB, poor effort, diminished in bases ABD: s/nt. Mild distension SKIN: no rashes/lesions [id number] pedal edema R IJ Temp HD cath present Neuro: Faint asterixus  present  A 1. Anuric AoCKD3 (SCr 04/2015 2.0; suspect diabteic disease) with subnephrotic proteinuria; not obstructed, foley in place; suspect due to #3, #6, #7 leading to ATN; s/p R IJ Temp HD cath 06/19/15 2. Diabetic Foot Ulcer needing amputation 3. pSBO with NGT 4. ? Colonic malignancy based on CT 12/30 5. UGIB with NGT in place, Hb stable 6. Leukocytosis 7. Mild Rhabdomyolysis at admission resolved 8. Mild Hyperkalemia, resolved 9. DM2 10. HTN on ACEi (now held) 11. Dehydration, now volume replete 12. Debility / Loss of independence   Plan 1. HD Today for azotemia, anuric ATN; attempt some UF; no heparin 2. Daily weights, Daily Renal Panel, Strict I/Os, Avoid nephrotoxins (NSAIDs, judicious IV Contrast)   Pearson Grippe MD 06/20/2015, 7:31 AM   Recent Labs Lab 06/19/15 0910 06/19/15 1430 06/19/15 1543 06/20/15 0425  NA 127* 126* 129* 131*  K 5.0 5.0 4.8 4.3  CL 86* 86* 88* 89*  CO2 19* 19* 20* 24  GLUCOSE 340* 367* 357* 251*  BUN 91* 100* 101* 111*  CREATININE 7.45* 8.15* 8.17* 8.96*  CALCIUM 8.3* 8.1* 8.2* 8.3*  PHOS 8.3*  --  7.1* 7.3*    Recent Labs Lab 06/19/15 0910 06/19/15 1430 06/19/15 2322 06/20/15 0425  WBC 16.4* 15.1*  --  19.0*  HGB 11.9* 10.8* 11.1* 10.9*  HCT 36.1* 32.0* 32.0* 31.3*  MCV 83.2 82.9  --  79.8  PLT 233 214  --  222

## 2015-06-20 NOTE — Progress Notes (Signed)
ANTICOAGULATION CONSULT NOTE - Initial Consult  Pharmacy Consult for heparin Indication: atrial fibrillation  No Known Allergies  Patient Measurements: Height: 6\' 2"  (188 cm) Weight: 211 lb 13.8 oz (96.1 kg) IBW/kg (Calculated) : 82.2  Vital Signs: Temp: 98.6 F (37 C) (12/31 1539) Temp Source: Oral (12/31 1100) BP: 139/80 mmHg (12/31 1539) Pulse Rate: 114 (12/31 1539)  Labs:  Recent Labs  06/18/15 1258 06/18/15 1815 06/19/15 0617 06/19/15 0910 06/19/15 1430 06/19/15 1543 06/19/15 2322 06/20/15 0423 06/20/15 0425 06/20/15 1257  HGB  --   --   --  11.9* 10.8*  --  11.1*  --  10.9* 11.3*  HCT  --   --   --  36.1* 32.0*  --  32.0*  --  31.3* 33.1*  PLT  --   --   --  233 214  --   --   --  222  --   CREATININE 5.60* 6.05*  --  7.45* 8.15* 8.17*  --   --  8.96*  --   CKTOTAL 1983* 1449* 951*  --   --   --   --   --   --   --   TROPONINI 0.12* 0.06*  --   --  0.13*  --  0.07* 0.06*  --   --     Estimated Creatinine Clearance: 8.5 mL/min (by C-G formula based on Cr of 8.96).   Medical History: Past Medical History  Diagnosis Date  . Diabetes mellitus without complication ([place])   . Hypertension   . DKA (diabetic ketoacidoses) ([place]) 06/18/2015  . AKI (acute kidney injury) ([place]) 06/18/2015  . Cancer [facility])     hx of prostate cancer    Assessment: 74 yo M presents on 12/28 with should pain and a diabetic foot ulcer. Also has had some gastritis secodnary to uremia. GI has been consulted and is working up. Had a recent GI bleed. Pharmacy consulted to dose heparin for new Afib. Hgb stable at 11.3, plts wnl.   Goal of Therapy:  Heparin level 0.3-0.7 units/ml Monitor platelets by anticoagulation protocol: Yes   Plan:  No heparin BOLUS Start heparin 1450 units/hr Check 8 hr HL Monitor daily HL, CBC, s/s of bleed  Elenor Quinones, PharmD, BCPS Clinical Pharmacist Pager [phone] 06/20/2015 5:07 PM

## 2015-06-20 NOTE — Progress Notes (Signed)
Subjective: No pain, no flatus either, awake alert states he had csc in [place] 2 years ago that was normal  Objective: Vital signs in last 24 hours: Temp:  [98 F (36.7 C)-99.3 F (37.4 C)] 98.2 F (36.8 C) (12/31 0304) Pulse Rate:  [54-95] 83 (12/31 0304) Resp:  [14-23] 15 (12/31 0304) BP: (119-145)/(58-76) 119/66 mmHg (12/31 0304) SpO2:  [94 %-100 %] 95 % (12/31 0304) Weight:  [96.7 kg (213 lb 3 oz)-96.8 kg (213 lb 6.5 oz)] 96.8 kg (213 lb 6.5 oz) (12/31 0304) Last BM Date: 06/19/15  Intake/Output from previous day: 12/30 0701 - 12/31 0700 In: 572.5 [I.V.:382.5; NG/GT:90; IV Piggyback:100] Out: 1725 [Urine:25; Emesis/NG output:1700] Intake/Output this shift:    GI: moderate distention, nontender, some bs present  Lab Results:   Recent Labs  06/19/15 1430 06/19/15 2322 06/20/15 0425  WBC 15.1*  --  19.0*  HGB 10.8* 11.1* 10.9*  HCT 32.0* 32.0* 31.3*  PLT 214  --  222   BMET  Recent Labs  06/19/15 1543 06/20/15 0425  NA 129* 131*  K 4.8 4.3  CL 88* 89*  CO2 20* 24  GLUCOSE 357* 251*  BUN 101* 111*  CREATININE 8.17* 8.96*  CALCIUM 8.2* 8.3*   PT/INR No results for input(s): LABPROT, INR in the last 72 hours. ABG No results for input(s): PHART, HCO3 in the last 72 hours.  Invalid input(s): PCO2, PO2  Studies/Results: Ct Abdomen Pelvis Wo Contrast  06/19/2015  CLINICAL DATA:  68 YOM PRESENTS WITH SMALL BOWEL OBSTRUCTION EXAM: CT ABDOMEN AND PELVIS WITHOUT CONTRAST TECHNIQUE: Multidetector CT imaging of the abdomen and pelvis was performed following the standard protocol without IV contrast. COMPARISON:  06/19/2015 plain films FINDINGS: Lower chest: Bilateral small pleural effusions and bibasilar atelectasis. Nasogastric tube in place. Hiatal hernia noted. Upper abdomen: Scalloped contour of the liver noted. No focal abnormality identified within the liver, spleen, pancreas. Adrenal glands are diffusely mildly prominent. No focal lesions are identified  however. No focal abnormality identified within the kidneys. No hydronephrosis. Gallbladder is present. Gastrointestinal tract: Stomach is mildly distended and contains contrast. The ligament of Treitz is inferiorly positioned, consistent partial malrotation. The proximal small bowel loops are dilated with mild tapering in the left mid abdomen to normal caliber. The distal small bowel loops are normal in caliber. The appendix has a normal appearance. Small amounts of ascites. There is abnormal appearance of the cecum. There is a small amount of contrast in the cecal tip. However, the wall of the ascending colon appears very thickened and there is associated stranding within the mesentery. There is thickening of the right peritoneal reflection adjacent to the colon. The findings are suspicious for neoplasm. Other considerations include typhlitis or colitis. Contrast is seen beyond this level to normal appearing hepatic flexure and distal colon. Pelvis: Urinary bladder contains Foley catheter. Prostatic seeds are noted. Seminal vesicles have a normal appearance. There is moderate stool within the sigmoid and rectum. No free pelvic fluid. Retroperitoneum: There is moderate atherosclerosis of the abdominal aorta. No retroperitoneal or mesenteric adenopathy. Abdominal wall: Small left inguinal hernia contains ascites. Osseous structures: Mild degenerative changes are seen in the spine. IMPRESSION: 1. Partial small bowel obstruction, best localized to the left mid abdomen. 2. Abnormal appearance of the cecum and ascending colon. Findings are suspicious for malignancy. Other considerations include typhlitis or colitis. 3. Small bilateral pleural effusions.  Hiatal hernia. 4. Cirrhotic contour of the liver. 5. Partial malrotation of the small bowel. 6. Prostatic seeds. 7.  Abdominal aortic atherosclerosis. Electronically Signed   By: Nolon Nations M.D.   On: 06/19/2015 17:46   Dg Chest Port 1 View  06/19/2015   CLINICAL DATA:  Nasogastric tube and hemodialysis catheter placement EXAM: PORTABLE CHEST 1 VIEW COMPARISON:  Portable exam 1551 hours compared to 06/17/2015 FINDINGS: Nasogastric tube present extending into stomach. RIGHT jugular central venous catheter with tip projecting over SVC. Borderline enlargement of cardiac silhouette with slight vascular congestion. Atherosclerotic calcification at aortic arch. Lungs clear. No pleural effusion or pneumothorax. IMPRESSION: No acute abnormalities. Electronically Signed   By: Lavonia Dana M.D.   On: 06/19/2015 16:11   Dg Shoulder Right Port  06/18/2015  CLINICAL DATA:  Patient with diffuse right shoulder pain. No known injury. EXAM: PORTABLE RIGHT SHOULDER - 2+ VIEW COMPARISON:  Chest radiograph 06/17/2015 FINDINGS: Limited evaluation due to inability to position patient. No evidence for displaced fracture. AC joint degenerative changes. Visualized right hemi thorax is unremarkable. IMPRESSION: Limited exam due to difficulty with patient positioning. Within the above limitation, no evidence for displaced fracture. Recommend evaluation with standard radiographic views when patient clinically able. Electronically Signed   By: Lovey Newcomer M.D.   On: 06/18/2015 13:28   Dg Abd Portable 1v  06/19/2015  CLINICAL DATA:  Nasogastric tube placement EXAM: PORTABLE ABDOMEN - 1 VIEW COMPARISON:  Study obtained earlier in the day FINDINGS: Nasogastric tube tip and side port are in the stomach. Stomach is no longer distended with air. There are multiple loops of dilated small bowel, concerning for a degree of obstruction. No free air is seen on this supine examination. IMPRESSION: Nasogastric tube tip and side port in stomach. Stomach no longer distended with air. There remain loops of dilated small bowel, concerning for a degree of obstruction. Electronically Signed   By: Lowella Grip III M.D.   On: 06/19/2015 16:14   Dg Abd Portable 1v  06/19/2015  CLINICAL DATA:   Abdominal distension EXAM: PORTABLE ABDOMEN - 1 VIEW COMPARISON:  06/17/2015 FINDINGS: Interval significant distension of the stomach is seen. Increasing small bowel dilatation is noted. Persisting colonic air is noted consistent with a partial small bowel obstruction. Prostate brachytherapy seeds are noted. No free air is noted. IMPRESSION: Increasing distension of the small bowel and stomach as described. Decompression is recommended. Electronically Signed   By: Inez Catalina M.D.   On: 06/19/2015 10:54    Anti-infectives: Anti-infectives    Start     Dose/Rate Route Frequency Ordered Stop   06/19/15 2100  vancomycin (VANCOCIN) IVPB 1000 mg/200 mL premix  Status:  Discontinued     1,000 mg 200 mL/hr over 60 Minutes Intravenous Every 48 hours 06/17/15 2214 06/18/15 0945   06/18/15 1200  vancomycin (VANCOCIN) IVPB 1000 mg/200 mL premix     1,000 mg 200 mL/hr over 60 Minutes Intravenous Every 48 hours 06/18/15 0945     06/18/15 0530  piperacillin-tazobactam (ZOSYN) IVPB 2.25 g     2.25 g 100 mL/hr over 30 Minutes Intravenous Every 8 hours 06/17/15 2214     06/17/15 2115  piperacillin-tazobactam (ZOSYN) IVPB 3.375 g     3.375 g 100 mL/hr over 30 Minutes Intravenous  Once 06/17/15 2111 06/17/15 2151   06/17/15 2115  vancomycin (VANCOCIN) IVPB 1000 mg/200 mL premix     1,000 mg 200 mL/hr over 60 Minutes Intravenous  Once 06/17/15 2111 06/18/15 0235      Assessment/Plan: Ischemic bowel likely  I agree with Dr Murlean Caller note and fact he has csc  makes large bowel malignancy less likely.  I dont think he needs surgery and historically this all fits under likely ischemia. Agree with continued ng drainage, would consider iv ntn at some point also. Will follow  [facility] 06/20/2015

## 2015-06-21 ENCOUNTER — Inpatient Hospital Stay (HOSPITAL_COMMUNITY): Payer: Medicare HMO

## 2015-06-21 DIAGNOSIS — I4891 Unspecified atrial fibrillation: Secondary | ICD-10-CM | POA: Diagnosis present

## 2015-06-21 LAB — GLUCOSE, CAPILLARY
GLUCOSE-CAPILLARY: 107 mg/dL — AB (ref 65–99)
GLUCOSE-CAPILLARY: 148 mg/dL — AB (ref 65–99)
GLUCOSE-CAPILLARY: 182 mg/dL — AB (ref 65–99)
GLUCOSE-CAPILLARY: 193 mg/dL — AB (ref 65–99)
Glucose-Capillary: 102 mg/dL — ABNORMAL HIGH (ref 65–99)
Glucose-Capillary: 149 mg/dL — ABNORMAL HIGH (ref 65–99)
Glucose-Capillary: 180 mg/dL — ABNORMAL HIGH (ref 65–99)

## 2015-06-21 LAB — RENAL FUNCTION PANEL
ALBUMIN: 1.8 g/dL — AB (ref 3.5–5.0)
ANION GAP: 16 — AB (ref 5–15)
Albumin: 1.9 g/dL — ABNORMAL LOW (ref 3.5–5.0)
Anion gap: 18 — ABNORMAL HIGH (ref 5–15)
BUN: 73 mg/dL — AB (ref 6–20)
BUN: 90 mg/dL — ABNORMAL HIGH (ref 6–20)
CALCIUM: 8.1 mg/dL — AB (ref 8.9–10.3)
CHLORIDE: 97 mmol/L — AB (ref 101–111)
CO2: 23 mmol/L (ref 22–32)
CO2: 24 mmol/L (ref 22–32)
CREATININE: 7.49 mg/dL — AB (ref 0.61–1.24)
Calcium: 8.2 mg/dL — ABNORMAL LOW (ref 8.9–10.3)
Chloride: 95 mmol/L — ABNORMAL LOW (ref 101–111)
Creatinine, Ser: 9.14 mg/dL — ABNORMAL HIGH (ref 0.61–1.24)
GFR, EST AFRICAN AMERICAN: 7 mL/min — AB (ref >60)
GFR, EST AFRICAN AMERICAN: 6 mL/min — AB (ref >60)
GFR, EST NON AFRICAN AMERICAN: 6 mL/min — AB (ref >60)
GFR, EST NON AFRICAN AMERICAN: 5 mL/min — AB (ref >60)
Glucose, Bld: 180 mg/dL — ABNORMAL HIGH (ref 65–99)
Glucose, Bld: 133 mg/dL — ABNORMAL HIGH (ref 65–99)
POTASSIUM: 4.3 mmol/L (ref 3.5–5.1)
Phosphorus: 6.7 mg/dL — ABNORMAL HIGH (ref 2.5–4.6)
Phosphorus: 6 mg/dL — ABNORMAL HIGH (ref 2.5–4.6)
Potassium: 4.6 mmol/L (ref 3.5–5.1)
SODIUM: 136 mmol/L (ref 135–145)
SODIUM: 137 mmol/L (ref 135–145)

## 2015-06-21 LAB — CBC
HCT: 32.3 % — ABNORMAL LOW (ref 39.0–52.0)
HEMOGLOBIN: 11.2 g/dL — AB (ref 13.0–17.0)
MCH: 28 pg (ref 26.0–34.0)
MCHC: 34.7 g/dL (ref 30.0–36.0)
MCV: 80.8 fL (ref 78.0–100.0)
Platelets: 273 10*3/uL (ref 150–400)
RBC: 4 MIL/uL — ABNORMAL LOW (ref 4.22–5.81)
RDW: 13.7 % (ref 11.5–15.5)
WBC: 20.2 10*3/uL — ABNORMAL HIGH (ref 4.0–10.5)

## 2015-06-21 LAB — HEPARIN LEVEL (UNFRACTIONATED)
HEPARIN UNFRACTIONATED: 0.41 [IU]/mL (ref 0.30–0.70)
HEPARIN UNFRACTIONATED: 0.63 [IU]/mL (ref 0.30–0.70)

## 2015-06-21 LAB — TROPONIN I: TROPONIN I: 0.2 ng/mL — AB (ref <0.031)

## 2015-06-21 LAB — PROCALCITONIN: PROCALCITONIN: 29.67 ng/mL

## 2015-06-21 MED ORDER — CHLORHEXIDINE GLUCONATE 0.12 % MT SOLN
15.0000 mL | Freq: Two times a day (BID) | OROMUCOSAL | Status: DC
  Administered 2015-06-21 – 2015-07-11 (×32): 15 mL via OROMUCOSAL
  Filled 2015-06-21 (×28): qty 15

## 2015-06-21 MED ORDER — CETYLPYRIDINIUM CHLORIDE 0.05 % MT LIQD
7.0000 mL | Freq: Two times a day (BID) | OROMUCOSAL | Status: DC
  Administered 2015-06-21 – 2015-07-14 (×34): 7 mL via OROMUCOSAL

## 2015-06-21 NOTE — Progress Notes (Signed)
ANTICOAGULATION CONSULT NOTE - Follow-up Consult  Pharmacy Consult for heparin Indication: atrial fibrillation  No Known Allergies  Patient Measurements: Height: 6\' 2"  (188 cm) Weight: 211 lb 13.8 oz (96.1 kg) IBW/kg (Calculated) : 82.2  Vital Signs: Temp: 98.6 F (37 C) (01/01 0018) Temp Source: Oral (01/01 0018) BP: 127/70 mmHg (01/01 0018) Pulse Rate: 119 (01/01 0018)  Labs:  Recent Labs  06/18/15 1258 06/18/15 1815 06/19/15 0617  06/19/15 1430 06/19/15 1543 06/19/15 2322 06/20/15 0423 06/20/15 0425 06/20/15 1257 06/20/15 1730 06/21/15 0201 06/21/15 0203  HGB  --   --   --   < > 10.8*  --  11.1*  --  10.9* 11.3*  --   --  11.2*  HCT  --   --   --   < > 32.0*  --  32.0*  --  31.3* 33.1*  --   --  32.3*  PLT  --   --   --   < > 214  --   --   --  222  --   --   --  273  HEPARINUNFRC  --   --   --   --   --   --   --   --   --   --   --  0.41  --   CREATININE 5.60* 6.05*  --   < > 8.15* 8.17*  --   --  8.96*  --  6.45*  --   --   CKTOTAL 1983* 1449* 951*  --   --   --   --   --   --   --   --   --   --   TROPONINI 0.12* 0.06*  --   --  0.13*  --  0.07* 0.06*  --   --  0.06*  --   --   < > = values in this interval not displayed.  Estimated Creatinine Clearance: 11.9 mL/min (by C-G formula based on Cr of 6.45).   Assessment: 75 yo M on heparin for new afib. Pt with noted recent GI bleed. Heparin level 0.41 (therapeutic) on 1450 units/hr. Hgb stable, no bleeding noted.   Goal of Therapy:  Heparin level 0.3-0.7 units/ml Monitor platelets by anticoagulation protocol: Yes   Plan:  Continue heparin 1450 units/hr F/u 6 hr confirmatory level  Sherlon Handing, PharmD, BCPS Clinical pharmacist, pager [phone] 06/21/2015 2:32 AM

## 2015-06-21 NOTE — Progress Notes (Signed)
[facility] PROGRESS NOTE  Chandlar Cobble [id number] DOB: 02/13/1941 DOA: 06/17/2015 PCP: No primary care provider on file.  Brief Summary  David Ford is a 75 y.o. male with history of diabetes mellitus with noncompliance, memory loss, hypertension.  Patient is a poor historian and gives a different history each time he is asked about the events preceding admission. Patient lives at home alone.  He was found by a friend on the floor who called EMS.  His daughter reported that he had had swelling and darkness of the toes on his left foot for several months.  When she saw his toes during this admission, however, they appeared much more swollen and malodorous than before. Emergency department he was found to have evidence of rhabdomyolysis with CKs 3053, his initial lab work showing creatinine of 4 potassium was 6.0 and an gap was 16.  He was unable to produce urine. Troponin slightly elevated to 0.05 albumin 2.9, white blood cell, was noted to be elevated 16.2.  Hospitalist was called for admission for dehydration rhabdomyolysis evidence of acute renal failure.  12/29:  Cardiology recommends outpatient lexiscan 12/30:  Anuric, plan to start HD.  SBO with GIB > GI consulted.  Transfer to stepdown.  Toe amputation deferred.  HD catheter placed by PCCM 12/31:  Started HD and developed a-fib    Assessment/Plan  Sepsis due to diabetic foot ulcer with gangrene (tachycardia and leukocytosis) -  Continue vancomycin and zosyn -  Dr. Sharol Given assistance appreciated  Partial SBO, localized to left mid-abdomen with partial malrotation of the small bowel.   -  KUB this morning shows persistent pSBO -  Having additional BM -  NPO with IVF and IV medications -  NG to LIS -  Appreciate general surgery assistance, agree with attending to avoid surgery if possible -  If slow to resolve will need CCM or interventional radiology to place a line for TNA -  OOB, PT eval  Upper GIB:  Suspect some  gastritis secondary to uremia, ASA and possibly some trauma from NG insertion -  Continue PPI -  Hemoglobin stable, NG tube output appears bilious -  GI signed off  Anuric AKI, likely due to ACEI, progressive diabetic nephropathy -  BUN and creatinine down from yesterday morning, but trending up since post-HD and still anuric -  Appreciate nephrology assistance -  hemodialysis started 12/31 -  Family wondering about HD today  Cirrhotic appearing liver, ammonia 21, labs not consistent with end-stage disease  Abnormal appearance of the cecum with stranding, suspicious for neoplasm vs. Colitis  -  Defer workup at this time secondary to other acute illness -  May need repeat imaging and/or colonoscopy with biopsy  New onset atrial fibrillation, rate in low 100s -  CHADs2vasc = 4  -  Metoprolol prn rate control -  On heparin gtt for now  Probable demand ischemia with mildly elevated troponin in setting of renal failure and infection -  ECHO:  50-55% EF with mild to mod LVH, no regional wall motion abnl, grade 1 DD -  Cardiology recommending outpatient work up -  Troponins likely mildly elevated due to CKD and demand  Mild gap metabolic acidosis, resolved with bicarb containing IVF  Hyponatremia due to AKI, resolved with HD  Hyperkalemia secondary to AKI, resolved.  Trending up  Hypertension, blood pressures low normal to mildly hypotensive -  Continue to hold blood pressure medications  Leukocytosis due to infection and gangrene and worsening, likely due to bowel  obstruction -  Trend while on antibiotics  Diabetes mellitus type 2, A1c 14.2 -  Continue SSI q6h while NPO -  Continue 15 units lantus, but might go up a little tomorrow if CBG still borderline high  Bilateral shoulder pain, seems to be impingement or rotator cuff injury.  Exam not suggestive of intraarticular problem -  XR right shoulder:  No fracture, but difficult exam  -  Consider MRI as outpatient  Hx of  prostate cancer, seed implantation  Microcytic anemia -  Iron studies c/w iron deficiency, B12 963, folate wnl -  TSH 0.653  Diet:  NPO Access:  PIV IVF:  off Proph:  SCDs  Code Status: full Family Communication: patient and his daughter Disposition Plan:  Ongoing stepdown management for now.  HD to start today  Consultants:  Orthopedic surgery  Nephrology  Cardiology  Procedures:  RUS  ECHO  CT abd/pelvis  Antibiotics:  Zosyn 12/28 >  Vanc 12/29 >   HPI/Subjective:  Abdominal pain about the same as yesterday.  Ready to start HD.  Bleeding slowing.  Had large BM overnight.    Objective: Filed Vitals:   06/21/15 0018 06/21/15 0440 06/21/15 0700 06/21/15 1300  BP: 127/70 122/73  118/82  Pulse: 119 110  100  Temp: 98.6 F (37 C) 98.2 F (36.8 C) 98.2 F (36.8 C)   TempSrc: Oral Oral Oral   Resp: 15 16  15   Height:      Weight:      SpO2: 97% 99%  95%    Intake/Output Summary (Last 24 hours) at 06/21/15 1535 Last data filed at 06/21/15 0700  Gross per 24 hour  Intake   1015 ml  Output   1245 ml  Net   -230 ml   Filed Weights   06/19/15 1341 06/20/15 0304 06/20/15 1515  Weight: 96.7 kg (213 lb 3 oz) 96.8 kg (213 lb 6.5 oz) 96.1 kg (211 lb 13.8 oz)   Body mass index is 27.19 kg/(m^2).  Exam:   General:  Adult male, No acute distress  HEENT:  NCAT, MMM  Cardiovascular:  IRRR, nl S1, S2 no mrg, warm extremities  Respiratory:  CTAB, no increased WOB but mildly tachypneic  Abdomen:   Hypoactive BS, moderately distended, TTP in lower quadrants without rebound or guarding.  Soft  MSK:   Normal tone and bulk, left foot swollen, malodorous, darkening of 1st and second toes with somewhat purulent discharge from between toes.  2+ bilateral pedal pulses  Data Reviewed: Basic Metabolic Panel:  Recent Labs Lab 06/17/15 2235  06/18/15 0408  06/19/15 0910 06/19/15 1430 06/19/15 1543 06/20/15 0425 06/20/15 1730 06/21/15 0201  NA 133*  < >  133*  < > 127* 126* 129* 131* 134* 136  K 6.3*  < > 5.0  < > 5.0 5.0 4.8 4.3 3.8 4.6  CL 95*  < > 98*  < > 86* 86* 88* 89* 93* 95*  CO2 23  < > 23  < > 19* 19* 20* 24 24 23   GLUCOSE 332*  < > 179*  < > 340* 367* 357* 251* 152* 180*  BUN 64*  < > 66*  < > 91* 100* 101* 111* 62* 73*  CREATININE 4.18*  < > 4.75*  < > 7.45* 8.15* 8.17* 8.96* 6.45* 7.49*  CALCIUM 9.1  < > 8.9  < > 8.3* 8.1* 8.2* 8.3* 8.2* 8.1*  MG 2.2  --  2.2  --   --   --   --   --   --   --  PHOS 6.0*  --  5.7*  --  8.3*  --  7.1* 7.3* 5.3* 6.7*  < > = values in this interval not displayed. Liver Function Tests:  Recent Labs Lab 06/17/15 1708 06/18/15 0408  06/19/15 1430 06/19/15 1543 06/20/15 0425 06/20/15 1730 06/21/15 0201  AST 73* 68*  --  69*  --   --   --   --   ALT 29 28  --  45  --   --   --   --   ALKPHOS 91 91  --  83  --   --   --   --   BILITOT 0.6 0.5  --  1.0  --   --   --   --   PROT 6.6 5.8*  --  5.7*  --   --   --   --   ALBUMIN 2.9* 2.6*  < > 2.1* 2.2* 2.0* 2.0* 1.8*  < > = values in this interval not displayed. No results for input(s): LIPASE, AMYLASE in the last 168 hours.  Recent Labs Lab 06/20/15 0421  AMMONIA 21   CBC:  Recent Labs Lab 06/18/15 0408 06/19/15 0910 06/19/15 1430 06/19/15 2322 06/20/15 0425 06/20/15 1257 06/21/15 0203  WBC 12.9* 16.4* 15.1*  --  19.0*  --  20.2*  HGB 11.2* 11.9* 10.8* 11.1* 10.9* 11.3* 11.2*  HCT 34.4* 36.1* 32.0* 32.0* 31.3* 33.1* 32.3*  MCV 85.4 83.2 82.9  --  79.8  --  80.8  PLT 208 233 214  --  222  --  273    Recent Results (from the past 240 hour(s))  Urine culture     Status: None   Collection Time: 06/18/15  1:18 AM  Result Value Ref Range Status   Specimen Description URINE, CATHETERIZED  Final   Special Requests NONE  Final   Culture NO GROWTH 1 DAY  Final   Report Status 06/19/2015 FINAL  Final  Surgical pcr screen     Status: None   Collection Time: 06/19/15  5:25 AM  Result Value Ref Range Status   MRSA, PCR NEGATIVE  NEGATIVE Final   Staphylococcus aureus NEGATIVE NEGATIVE Final    Comment:        The Xpert SA Assay (FDA approved for NASAL specimens in patients over 65 years of age), is one component of a comprehensive surveillance program.  Test performance has been validated by [facility] for patients greater than or equal to 70 year old. It is not intended to diagnose infection nor to guide or monitor treatment.      Studies: Ct Abdomen Pelvis Wo Contrast  06/19/2015  CLINICAL DATA:  10 YOM PRESENTS WITH SMALL BOWEL OBSTRUCTION EXAM: CT ABDOMEN AND PELVIS WITHOUT CONTRAST TECHNIQUE: Multidetector CT imaging of the abdomen and pelvis was performed following the standard protocol without IV contrast. COMPARISON:  06/19/2015 plain films FINDINGS: Lower chest: Bilateral small pleural effusions and bibasilar atelectasis. Nasogastric tube in place. Hiatal hernia noted. Upper abdomen: Scalloped contour of the liver noted. No focal abnormality identified within the liver, spleen, pancreas. Adrenal glands are diffusely mildly prominent. No focal lesions are identified however. No focal abnormality identified within the kidneys. No hydronephrosis. Gallbladder is present. Gastrointestinal tract: Stomach is mildly distended and contains contrast. The ligament of Treitz is inferiorly positioned, consistent partial malrotation. The proximal small bowel loops are dilated with mild tapering in the left mid abdomen to normal caliber. The distal small bowel loops are normal in caliber. The appendix  has a normal appearance. Small amounts of ascites. There is abnormal appearance of the cecum. There is a small amount of contrast in the cecal tip. However, the wall of the ascending colon appears very thickened and there is associated stranding within the mesentery. There is thickening of the right peritoneal reflection adjacent to the colon. The findings are suspicious for neoplasm. Other considerations include typhlitis or  colitis. Contrast is seen beyond this level to normal appearing hepatic flexure and distal colon. Pelvis: Urinary bladder contains Foley catheter. Prostatic seeds are noted. Seminal vesicles have a normal appearance. There is moderate stool within the sigmoid and rectum. No free pelvic fluid. Retroperitoneum: There is moderate atherosclerosis of the abdominal aorta. No retroperitoneal or mesenteric adenopathy. Abdominal wall: Small left inguinal hernia contains ascites. Osseous structures: Mild degenerative changes are seen in the spine. IMPRESSION: 1. Partial small bowel obstruction, best localized to the left mid abdomen. 2. Abnormal appearance of the cecum and ascending colon. Findings are suspicious for malignancy. Other considerations include typhlitis or colitis. 3. Small bilateral pleural effusions.  Hiatal hernia. 4. Cirrhotic contour of the liver. 5. Partial malrotation of the small bowel. 6. Prostatic seeds. 7. Abdominal aortic atherosclerosis. Electronically Signed   By: Nolon Nations M.D.   On: 06/19/2015 17:46   Dg Chest Port 1 View  06/19/2015  CLINICAL DATA:  Nasogastric tube and hemodialysis catheter placement EXAM: PORTABLE CHEST 1 VIEW COMPARISON:  Portable exam 1551 hours compared to 06/17/2015 FINDINGS: Nasogastric tube present extending into stomach. RIGHT jugular central venous catheter with tip projecting over SVC. Borderline enlargement of cardiac silhouette with slight vascular congestion. Atherosclerotic calcification at aortic arch. Lungs clear. No pleural effusion or pneumothorax. IMPRESSION: No acute abnormalities. Electronically Signed   By: Lavonia Dana M.D.   On: 06/19/2015 16:11   Dg Abd Portable 1v  06/21/2015  CLINICAL DATA:  Small bowel obstruction EXAM: PORTABLE ABDOMEN - 1 VIEW COMPARISON:  06/20/2015 FINDINGS: Nasogastric tube is in place with tip overlying the level of the stomach. Numerous dilated small bowel loops are again identified. There is paucity of large bowel  gas, identified within nondilated loops. No evidence for free intraperitoneal air on this supine view of the abdomen. Note is made of prostatic seeds. IMPRESSION: Persistent, stable dilatation of small bowel loops consistent with partial small bowel obstruction. Electronically Signed   By: Nolon Nations M.D.   On: 06/21/2015 08:56   Dg Abd Portable 1v  06/20/2015  CLINICAL DATA:  Small bowel obstruction EXAM: PORTABLE ABDOMEN - 1 VIEW COMPARISON:  06/19/2015 FINDINGS: Nasogastric tube is coiled in the stomach. Stacked loops of gas-filled small bowel are present in the left abdomen, similar to prior, measuring up to 5.2 cm diameter, previously 4.8 cm diameter. No dilated colon is observed. No secondary signs of free intraperitoneal gas. IMPRESSION: 1. Slight increase in small bowel dilation, individual loops measuring up to 5.2 cm in diameter. Appearance compatible with small bowel obstruction. Electronically Signed   By: Van Clines M.D.   On: 06/20/2015 08:16   Dg Abd Portable 1v  06/19/2015  CLINICAL DATA:  Nasogastric tube placement EXAM: PORTABLE ABDOMEN - 1 VIEW COMPARISON:  Study obtained earlier in the day FINDINGS: Nasogastric tube tip and side port are in the stomach. Stomach is no longer distended with air. There are multiple loops of dilated small bowel, concerning for a degree of obstruction. No free air is seen on this supine examination. IMPRESSION: Nasogastric tube tip and side port in stomach. Stomach no  longer distended with air. There remain loops of dilated small bowel, concerning for a degree of obstruction. Electronically Signed   By: Lowella Grip III M.D.   On: 06/19/2015 16:14    Scheduled Meds: . insulin aspart  0-9 Units Subcutaneous 6 times per day  . insulin glargine  15 Units Subcutaneous Daily  . [START ON 06/23/2015] pantoprazole (PROTONIX) IV  40 mg Intravenous Q12H  . piperacillin-tazobactam (ZOSYN)  IV  2.25 g Intravenous Q8H  . sodium chloride  3 mL  Intravenous Q12H   Continuous Infusions: . heparin 1,450 Units/hr (06/21/15 1015)  . pantoprozole (PROTONIX) infusion 8 mg/hr (06/21/15 1040)    Active Problems:   Hypertension   Leukocytosis   Elevated troponin   Hyponatremia   Hypoalbuminemia   Hyperkalemia   Rhabdomyolysis   Metabolic acidosis   Acute renal failure (ARF) ([place])   DM type 2 causing renal disease ([place])   Dehydration   Debility   Diabetic foot infection ([place])   Ileus ([facility])   SBO (small bowel obstruction) ([place])   Gastrointestinal hemorrhage with hematemesis    Time spent: 30 min    Saige Busby, [facility] Pager [phone]. If 7PM-7AM, please contact night-coverage at [email or web address], password [facility] 06/21/2015, 3:35 PM  LOS: 4 days

## 2015-06-21 NOTE — Progress Notes (Signed)
Admit: 06/17/2015 LOS: 4  12M with renal insufficiency, unclear duration, mild hyperkalemia, admitted after down on ground for 3d; on ACEi; with mild rhabdomyolysis; diabetic foot ulcer  Subjective:  Pt more awake this AM HD yesterday had AFib with RVR, slower in 110s this AM   12/31 0701 - 01/01 0700 In: [id number] [I.V.:775; NG/GT:90; IV Piggyback:150] Out: 2050 [Urine:85; Emesis/NG output:1550]  Filed Weights   06/19/15 1341 06/20/15 0304 06/20/15 1515  Weight: 96.7 kg (213 lb 3 oz) 96.8 kg (213 lb 6.5 oz) 96.1 kg (211 lb 13.8 oz)    Scheduled Meds: . insulin aspart  0-9 Units Subcutaneous 6 times per day  . insulin glargine  15 Units Subcutaneous Daily  . [START ON 06/23/2015] pantoprazole (PROTONIX) IV  40 mg Intravenous Q12H  . piperacillin-tazobactam (ZOSYN)  IV  2.25 g Intravenous Q8H  . sodium chloride  3 mL Intravenous Q12H   Continuous Infusions: . heparin 1,450 Units/hr (06/21/15 0600)  . pantoprozole (PROTONIX) infusion 8 mg/hr (06/20/15 2000)   PRN Meds:.dextrose, HYDROmorphone (DILAUDID) injection, metoprolol, ondansetron (ZOFRAN) IV  Current Labs: reviewed    Physical Exam:  Blood pressure 122/73, pulse 110, temperature 98.2 F (36.8 C), temperature source Oral, resp. rate 16, height 6\' 2"  (1.88 m), weight 96.1 kg (211 lb 13.8 oz), SpO2 99 %. GEN: NAD, elderly male lying in bed ENT: NCAT; NGT in place with bilious contents EYES: EOMI CV: RRR, no rub PULM: CTAB, poor effort, diminished in bases ABD: s/nt. Mild distension SKIN: no rashes/lesions [id number] pedal edema R IJ Temp HD cath present Neuro: Faint asterixus present  A 1. Anuric AoCKD3 (SCr 04/2015 2.0; suspect diabteic disease) with subnephrotic proteinuria; not obstructed, foley in place; suspect due to #3, #6, #7 leading to ATN; s/p R IJ Temp HD cath 06/19/15 and s tarted HD 12/31 2. Diabetic Foot Ulcer needing amputation 3. pSBO with NGT 4. UGIB with NGT in place, Hb stable; GI  s/o 5. Leukocytosis 6. Mild Rhabdomyolysis at admission; resolved 7. Mild Hyperkalemia, resolved 8. DM2 9. HTN on ACEi (now held) 10. Dehydration, now volume replete 11. Debility / Loss of independence   Plan 1. No HD today 2. Follow on daily basis, anticipate will need tomorrow 3. Daily weights, Daily Renal Panel, Strict I/Os, Avoid nephrotoxins (NSAIDs, judicious IV Contrast)   Pearson Grippe MD 06/21/2015, 7:09 AM   Recent Labs Lab 06/20/15 0425 06/20/15 1730 06/21/15 0201  NA 131* 134* 136  K 4.3 3.8 4.6  CL 89* 93* 95*  CO2 24 24 23   GLUCOSE 251* 152* 180*  BUN 111* 62* 73*  CREATININE 8.96* 6.45* 7.49*  CALCIUM 8.3* 8.2* 8.1*  PHOS 7.3* 5.3* 6.7*    Recent Labs Lab 06/19/15 1430  06/20/15 0425 06/20/15 1257 06/21/15 0203  WBC 15.1*  --  19.0*  --  20.2*  HGB 10.8*  < > 10.9* 11.3* 11.2*  HCT 32.0*  < > 31.3* 33.1* 32.3*  MCV 82.9  --  79.8  --  80.8  PLT 214  --  222  --  273  < > = values in this interval not displayed.

## 2015-06-21 NOTE — Progress Notes (Signed)
Hemoglobin remains stable, and NG output is nonbloody (dark green "motor oil" appearance).  Patient has evidence for partial bowel obstruction, being followed by surgery.  We will sign off; please call us if we can be of further assistance in this patient's care.  Cleotis Nipper, M.D. Pager [phone] If no answer or after 5 PM call [phone]

## 2015-06-21 NOTE — Progress Notes (Signed)
ANTICOAGULATION CONSULT NOTE - Follow-up Consult  Pharmacy Consult for heparin Indication: atrial fibrillation  No Known Allergies  Patient Measurements: Height: 6\' 2"  (188 cm) Weight: 211 lb 13.8 oz (96.1 kg) IBW/kg (Calculated) : 82.2  Vital Signs: Temp: 98.2 F (36.8 C) (01/01 0700) Temp Source: Oral (01/01 0700) BP: 122/73 mmHg (01/01 0440) Pulse Rate: 110 (01/01 0440)  Labs:  Recent Labs  06/18/15 1258 06/18/15 1815 06/19/15 0617  06/19/15 1430  06/20/15 0423 06/20/15 0425 06/20/15 1257 06/20/15 1730 06/21/15 0201 06/21/15 0203 06/21/15 1027  HGB  --   --   --   < > 10.8*  < >  --  10.9* 11.3*  --   --  11.2*  --   HCT  --   --   --   < > 32.0*  < >  --  31.3* 33.1*  --   --  32.3*  --   PLT  --   --   --   < > 214  --   --  222  --   --   --  273  --   HEPARINUNFRC  --   --   --   --   --   --   --   --   --   --  0.41  --  0.63  CREATININE 5.60* 6.05*  --   < > 8.15*  < >  --  8.96*  --  6.45* 7.49*  --   --   CKTOTAL 1983* 1449* 951*  --   --   --   --   --   --   --   --   --   --   TROPONINI 0.12* 0.06*  --   --  0.13*  < > 0.06*  --   --  0.06* 0.20*  --   --   < > = values in this interval not displayed.  Estimated Creatinine Clearance: 10.2 mL/min (by C-G formula based on Cr of 7.49).   Assessment: 75 yo M on heparin for new afib. Seemingly self-limiting coffee ground emesis from NG tube. GI consulted and following. Heparin level remains therapeutic at 0.63 on 1450 units/hr. Will continue same rate. Hgb stable at 11.2, plts wnl. No further bleeding noted.   Goal of Therapy:  Heparin level 0.3-0.7 units/ml Monitor platelets by anticoagulation protocol: Yes   Plan:  Continue heparin at 1450 units/hr Daily HL, CBC Monitor changes in renal fcn/further iHD plans, s/sx bleeding  Stephens November, PharmD Clinical Pharmacy Resident 06/21/2015 12:54 PM

## 2015-06-21 NOTE — Progress Notes (Signed)
Patient ID: David Ford, male   DOB: Oct 16, 1940, 75 y.o.   MRN: [id number]   LOS: 4 days   Subjective: Feels the same as yesterday. Denies N/V/flatus/BM. Denies pain.   Objective: Vital signs in last 24 hours: Temp:  [98.2 F (36.8 C)-98.6 F (37 C)] 98.2 F (36.8 C) (01/01 0700) Pulse Rate:  [45-129] 110 (01/01 0440) Resp:  [11-21] 16 (01/01 0440) BP: (105-143)/(70-96) 122/73 mmHg (01/01 0440) SpO2:  [93 %-99 %] 99 % (01/01 0440) Weight:  [96.1 kg (211 lb 13.8 oz)] 96.1 kg (211 lb 13.8 oz) (12/31 1515) Last BM Date: 06/19/15   NGT: 1551ml/24h   Laboratory  CBC  Recent Labs  06/20/15 0425 06/20/15 1257 06/21/15 0203  WBC 19.0*  --  20.2*  HGB 10.9* 11.3* 11.2*  HCT 31.3* 33.1* 32.3*  PLT 222  --  273   BMET  Recent Labs  06/20/15 1730 06/21/15 0201  NA 134* 136  K 3.8 4.6  CL 93* 95*  CO2 24 23  GLUCOSE 152* 180*  BUN 62* 73*  CREATININE 6.45* 7.49*  CALCIUM 8.2* 8.1*    Radiology Results PORTABLE ABDOMEN - 1 VIEW  COMPARISON: 06/20/2015  FINDINGS: Nasogastric tube is in place with tip overlying the level of the stomach. Numerous dilated small bowel loops are again identified. There is paucity of large bowel gas, identified within nondilated loops. No evidence for free intraperitoneal air on this supine view of the abdomen. Note is made of prostatic seeds.  IMPRESSION: Persistent, stable dilatation of small bowel loops consistent with partial small bowel obstruction.   Electronically Signed  By: Nolon Nations M.D.  On: 06/21/2015 08:56   Physical Exam General appearance: alert and no distress Resp: clear to auscultation bilaterally Cardio: regular rate and rhythm GI: Distended, +BS, NT   Assessment/Plan: PSBO -- Continue non-operative treatment. Continue NGT. Multiple medical problems -- per primary service    Lisette Abu, PA-C Pager: [phone] General Trauma PA Pager: [phone]  06/21/2015

## 2015-06-22 ENCOUNTER — Inpatient Hospital Stay (HOSPITAL_COMMUNITY): Payer: Medicare HMO

## 2015-06-22 DIAGNOSIS — I480 Paroxysmal atrial fibrillation: Secondary | ICD-10-CM

## 2015-06-22 LAB — GLUCOSE, CAPILLARY
GLUCOSE-CAPILLARY: 105 mg/dL — AB (ref 65–99)
GLUCOSE-CAPILLARY: 105 mg/dL — AB (ref 65–99)
GLUCOSE-CAPILLARY: 171 mg/dL — AB (ref 65–99)
Glucose-Capillary: 132 mg/dL — ABNORMAL HIGH (ref 65–99)
Glucose-Capillary: 131 mg/dL — ABNORMAL HIGH (ref 65–99)

## 2015-06-22 LAB — HEPARIN LEVEL (UNFRACTIONATED): HEPARIN UNFRACTIONATED: 0.5 [IU]/mL (ref 0.30–0.70)

## 2015-06-22 LAB — CBC
HEMATOCRIT: 32.8 % — AB (ref 39.0–52.0)
HEMOGLOBIN: 11.2 g/dL — AB (ref 13.0–17.0)
MCH: 27.5 pg (ref 26.0–34.0)
MCHC: 34.1 g/dL (ref 30.0–36.0)
MCV: 80.4 fL (ref 78.0–100.0)
Platelets: 292 10*3/uL (ref 150–400)
RBC: 4.08 MIL/uL — AB (ref 4.22–5.81)
RDW: 14.1 % (ref 11.5–15.5)
WBC: 19 10*3/uL — AB (ref 4.0–10.5)

## 2015-06-22 LAB — RENAL FUNCTION PANEL
ALBUMIN: 1.9 g/dL — AB (ref 3.5–5.0)
ANION GAP: 18 — AB (ref 5–15)
BUN: 103 mg/dL — ABNORMAL HIGH (ref 6–20)
CALCIUM: 8.4 mg/dL — AB (ref 8.9–10.3)
CO2: 22 mmol/L (ref 22–32)
Chloride: 97 mmol/L — ABNORMAL LOW (ref 101–111)
Creatinine, Ser: 9.94 mg/dL — ABNORMAL HIGH (ref 0.61–1.24)
GFR calc non Af Amer: 5 mL/min — ABNORMAL LOW (ref >60)
GFR, EST AFRICAN AMERICAN: 5 mL/min — AB (ref >60)
Glucose, Bld: 113 mg/dL — ABNORMAL HIGH (ref 65–99)
PHOSPHORUS: 6.7 mg/dL — AB (ref 2.5–4.6)
POTASSIUM: 4.3 mmol/L (ref 3.5–5.1)
SODIUM: 137 mmol/L (ref 135–145)

## 2015-06-22 MED ORDER — WHITE PETROLATUM GEL
Status: AC
  Administered 2015-06-23: 0.2
  Filled 2015-06-22: qty 1

## 2015-06-22 MED ORDER — VANCOMYCIN HCL IN DEXTROSE 1-5 GM/200ML-% IV SOLN
1000.0000 mg | Freq: Once | INTRAVENOUS | Status: AC
  Administered 2015-06-22: 1000 mg via INTRAVENOUS
  Filled 2015-06-22 (×2): qty 200

## 2015-06-22 MED ORDER — LATANOPROST 0.005 % OP SOLN
1.0000 [drp] | Freq: Every day | OPHTHALMIC | Status: DC
  Administered 2015-06-22 – 2015-07-14 (×19): 1 [drp] via OPHTHALMIC
  Filled 2015-06-22 (×6): qty 2.5

## 2015-06-22 MED ORDER — LORAZEPAM 2 MG/ML IJ SOLN: 0.5000 mg | Freq: Every evening | INTRAMUSCULAR | Status: DC | PRN

## 2015-06-22 MED ORDER — INSULIN GLARGINE 100 UNIT/ML ~~LOC~~ SOLN
10.0000 [IU] | Freq: Every day | SUBCUTANEOUS | Status: DC
  Administered 2015-06-22 – 2015-06-26 (×5): 10 [IU] via SUBCUTANEOUS
  Filled 2015-06-22 (×5): qty 0.1

## 2015-06-22 MED ORDER — INSULIN ASPART 100 UNIT/ML ~~LOC~~ SOLN
0.0000 [IU] | Freq: Four times a day (QID) | SUBCUTANEOUS | Status: DC
  Administered 2015-06-23 (×3): 2 [IU] via SUBCUTANEOUS
  Administered 2015-06-24: 1 [IU] via SUBCUTANEOUS
  Administered 2015-06-24: 2 [IU] via SUBCUTANEOUS
  Administered 2015-06-25: 7 [IU] via SUBCUTANEOUS
  Administered 2015-06-25: 9 [IU] via SUBCUTANEOUS

## 2015-06-22 MED ORDER — BRIMONIDINE TARTRATE 0.2 % OP SOLN
1.0000 [drp] | Freq: Two times a day (BID) | OPHTHALMIC | Status: DC
  Administered 2015-06-22 – 2015-07-15 (×37): 1 [drp] via OPHTHALMIC
  Filled 2015-06-22 (×6): qty 5

## 2015-06-22 MED ORDER — HYDROMORPHONE HCL 1 MG/ML IJ SOLN
INTRAMUSCULAR | Status: AC
  Administered 2015-06-22: 0.5 mg via INTRAVENOUS
  Filled 2015-06-22: qty 1

## 2015-06-22 MED ORDER — DIPHENHYDRAMINE HCL 50 MG/ML IJ SOLN
12.5000 mg | Freq: Every evening | INTRAMUSCULAR | Status: DC | PRN
  Administered 2015-06-22: 12.5 mg via INTRAVENOUS
  Filled 2015-06-22: qty 1

## 2015-06-22 MED ORDER — PIPERACILLIN-TAZOBACTAM IN DEX 2-0.25 GM/50ML IV SOLN
2.2500 g | Freq: Three times a day (TID) | INTRAVENOUS | Status: DC
  Administered 2015-06-23 – 2015-06-28 (×17): 2.25 g via INTRAVENOUS
  Filled 2015-06-22 (×23): qty 50

## 2015-06-22 NOTE — Progress Notes (Signed)
[facility] PROGRESS NOTE  David Ford [id number] DOB: 1940/10/22 DOA: 06/17/2015 PCP: No primary care provider on file.  Brief Summary  David Ford is a 75 y.o. male with history of diabetes mellitus with noncompliance, memory loss, hypertension.  Patient is a poor historian and gives a different history each time he is asked about the events preceding admission. Patient lives at home alone.  He was found by a friend on the floor who called EMS.  His daughter reported that he had had swelling and darkness of the toes on his left foot for several months.  When she saw his toes during this admission, however, they appeared much more swollen and malodorous than before. Emergency department he was found to have evidence of rhabdomyolysis with CKs 3053, his initial lab work showing creatinine of 4 potassium was 6.0 and an gap was 16.  He was unable to produce urine. Troponin slightly elevated to 0.05 albumin 2.9, white blood cell, was noted to be elevated 16.2.  Hospitalist was called for admission for dehydration rhabdomyolysis evidence of acute renal failure.  12/29:  Cardiology recommends outpatient lexiscan 12/30:  Anuric, plan to start HD.  SBO with GIB > GI consulted.  Transfer to stepdown.  Toe amputation deferred.  HD catheter placed by PCCM 12/31:  Started HD and developed a-fib  1/2:  spontaneously reverted to NSR   Assessment/Plan  Sepsis due to diabetic foot ulcer with gangrene (tachycardia and leukocytosis) -  Continue vancomycin and zosyn -  Dr. Sharol Given assistance appreciated  Partial SBO, localized to left mid-abdomen with partial malrotation of the small bowel, possible due to bowel ischemia -  KUB this morning shows persistent pSBO -  NPO with IVF and IV medications -  NG to LIS -  Appreciate general surgery assistance, agree with attending to avoid surgery if possible -  OOB, PT eval  Upper GIB:  Suspect some gastritis secondary to uremia, ASA and possibly some  trauma from NG insertion -  Change to BID PPI -  Hemoglobin stable, NG tube output appears bilious -  GI signed off  Anuric ARF, likely due to ACEI, progressive diabetic nephropathy, still anuric,  -  Appreciate nephrology assistance -  hemodialysis started 12/31 -  HD today  Cirrhotic appearing liver, ammonia 21, labs not consistent with end-stage disease  Abnormal appearance of the cecum with stranding, suspicious for neoplasm vs. Colitis  -  Defer workup at this time secondary to other acute illness -  May need repeat imaging and/or colonoscopy with biopsy  Paroxysmal atrial fibrillation, rate in low 100s, reverted to SR on 1/2 -  CHADs2vasc = 4  -  Metoprolol prn -  On heparin gtt for now  Probable demand ischemia with mildly elevated troponin in setting of renal failure and infection -  ECHO:  50-55% EF with mild to mod LVH, no regional wall motion abnl, grade 1 DD -  Cardiology recommending outpatient work up -  Troponins likely mildly elevated due to CKD and demand  Mild gap metabolic acidosis, hyponatremia, hyperkalemia, 2/2 AKI and resolved with HD  Hypertension, blood pressures stable -  Continue to hold blood pressure medications -  Consider scheduled metoprolol IV   Leukocytosis due to infection and gangrene and plateauing -  Trend while on antibiotics  Diabetes mellitus type 2, A1c 14.2, CBG low normal -  Continue SSI q6h while NPO -  Decrease to 10 units lantus -  Consider D10 fluids or TPN if going to be NPO  for much longer  Bilateral shoulder pain, seems to be impingement or rotator cuff injury.  Exam not suggestive of intraarticular problem -  XR right shoulder:  No fracture, but difficult exam  -  Consider MRI as outpatient  Hx of prostate cancer, seed implantation  Microcytic anemia -  Iron studies c/w iron deficiency, B12 963, folate wnl -  TSH 0.653  Diet:  NPO Access:  PIV IVF:  off Proph:  SCDs  Code Status: full Family Communication:  patient alone Disposition Plan:  Still very ill, prognosis tenuous  Consultants:  Orthopedic surgery  Nephrology  Cardiology  Procedures:  RUS  ECHO  CT abd/pelvis  Antibiotics:  Zosyn 12/28 >  Vanc 12/29 >   HPI/Subjective:  Abdominal pain about the same as yesterday.  Not able to sleep well at night  Objective: Filed Vitals:   06/22/15 0303 06/22/15 0732 06/22/15 0800 06/22/15 1117  BP:   143/71   Pulse:   77   Temp: 98.2 F (36.8 C) 98.6 F (37 C)  98.1 F (36.7 C)  TempSrc: Oral Axillary  Oral  Resp:   12   Height:      Weight:      SpO2:   96%     Intake/Output Summary (Last 24 hours) at 06/22/15 1202 Last data filed at 06/22/15 0341  Gross per 24 hour  Intake    130 ml  Output   1000 ml  Net   -870 ml   Filed Weights   06/19/15 1341 06/20/15 0304 06/20/15 1515  Weight: 96.7 kg (213 lb 3 oz) 96.8 kg (213 lb 6.5 oz) 96.1 kg (211 lb 13.8 oz)   Body mass index is 27.19 kg/(m^2).  Exam:   General:  Adult male, No acute distress  HEENT:  NCAT, MMM  Cardiovascular:  RRR, nl S1, S2 no mrg, warm extremities  Respiratory:  CTAB  Abdomen:   Hypoactive BS, moderately distended, TTP in lower quadrants without rebound or guarding.  Soft  MSK:   Normal tone and bulk, left foot swollen, malodorous, dressed and not fully examined today  Data Reviewed: Basic Metabolic Panel:  Recent Labs Lab 06/17/15 2235  06/18/15 0408  06/20/15 0425 06/20/15 1730 06/21/15 0201 06/21/15 1725 06/22/15 0530  NA 133*  < > 133*  < > 131* 134* 136 137 137  K 6.3*  < > 5.0  < > 4.3 3.8 4.6 4.3 4.3  CL 95*  < > 98*  < > 89* 93* 95* 97* 97*  CO2 23  < > 23  < > 24 24 23 24 22   GLUCOSE 332*  < > 179*  < > 251* 152* 180* 133* 113*  BUN 64*  < > 66*  < > 111* 62* 73* 90* 103*  CREATININE 4.18*  < > 4.75*  < > 8.96* 6.45* 7.49* 9.14* 9.94*  CALCIUM 9.1  < > 8.9  < > 8.3* 8.2* 8.1* 8.2* 8.4*  MG 2.2  --  2.2  --   --   --   --   --   --   PHOS 6.0*  --  5.7*  < >  7.3* 5.3* 6.7* 6.0* 6.7*  < > = values in this interval not displayed. Liver Function Tests:  Recent Labs Lab 06/17/15 1708 06/18/15 0408  06/19/15 1430  06/20/15 0425 06/20/15 1730 06/21/15 0201 06/21/15 1725 06/22/15 0530  AST 73* 68*  --  69*  --   --   --   --   --   --  ALT 29 28  --  45  --   --   --   --   --   --   ALKPHOS 91 91  --  83  --   --   --   --   --   --   BILITOT 0.6 0.5  --  1.0  --   --   --   --   --   --   PROT 6.6 5.8*  --  5.7*  --   --   --   --   --   --   ALBUMIN 2.9* 2.6*  < > 2.1*  < > 2.0* 2.0* 1.8* 1.9* 1.9*  < > = values in this interval not displayed. No results for input(s): LIPASE, AMYLASE in the last 168 hours.  Recent Labs Lab 06/20/15 0421  AMMONIA 21   CBC:  Recent Labs Lab 06/19/15 0910 06/19/15 1430 06/19/15 2322 06/20/15 0425 06/20/15 1257 06/21/15 0203 06/22/15 0530  WBC 16.4* 15.1*  --  19.0*  --  20.2* 19.0*  HGB 11.9* 10.8* 11.1* 10.9* 11.3* 11.2* 11.2*  HCT 36.1* 32.0* 32.0* 31.3* 33.1* 32.3* 32.8*  MCV 83.2 82.9  --  79.8  --  80.8 80.4  PLT 233 214  --  222  --  273 292    Recent Results (from the past 240 hour(s))  Urine culture     Status: None   Collection Time: 06/18/15  1:18 AM  Result Value Ref Range Status   Specimen Description URINE, CATHETERIZED  Final   Special Requests NONE  Final   Culture NO GROWTH 1 DAY  Final   Report Status 06/19/2015 FINAL  Final  Surgical pcr screen     Status: None   Collection Time: 06/19/15  5:25 AM  Result Value Ref Range Status   MRSA, PCR NEGATIVE NEGATIVE Final   Staphylococcus aureus NEGATIVE NEGATIVE Final    Comment:        The Xpert SA Assay (FDA approved for NASAL specimens in patients over 54 years of age), is one component of a comprehensive surveillance program.  Test performance has been validated by [facility] for patients greater than or equal to 56 year old. It is not intended to diagnose infection nor to guide or monitor treatment.       Studies: Dg Abd Portable 1v  06/22/2015  CLINICAL DATA:  74 year old male with abdominal pain and distention. Small bowel obstruction. EXAM: PORTABLE ABDOMEN - 1 VIEW COMPARISON:  Abdominal radiograph 06/21/2015. FINDINGS: Nasogastric tube coiled in the stomach with tip in the region of the cardia. Gaseous distention throughout the small bowel which is dilated up to 5.9 cm in diameter. Some gas is also noted throughout the colon. No gross evidence of pneumoperitoneum. Calcific density projecting over the left ilium, presumably something ingested. This is move slightly compared to yesterday's examination, and is new compared to prior study 06/20/2015. IMPRESSION: 1. Findings remain compatible with partial small bowel obstruction, as above. 2. No gross evidence of pneumoperitoneum on this single supine view. 3. Nasogastric tube is coiled back upon itself with tip in the cardia of the stomach. Electronically Signed   By: Vinnie Langton M.D.   On: 06/22/2015 08:01   Dg Abd Portable 1v  06/21/2015  CLINICAL DATA:  Small bowel obstruction EXAM: PORTABLE ABDOMEN - 1 VIEW COMPARISON:  06/20/2015 FINDINGS: Nasogastric tube is in place with tip overlying the level of the stomach. Numerous dilated small bowel loops  are again identified. There is paucity of large bowel gas, identified within nondilated loops. No evidence for free intraperitoneal air on this supine view of the abdomen. Note is made of prostatic seeds. IMPRESSION: Persistent, stable dilatation of small bowel loops consistent with partial small bowel obstruction. Electronically Signed   By: Nolon Nations M.D.   On: 06/21/2015 08:56    Scheduled Meds: . antiseptic oral rinse  7 mL Mouth Rinse q12n4p  . chlorhexidine  15 mL Mouth Rinse BID  . insulin aspart  0-9 Units Subcutaneous 4 times per day  . insulin glargine  10 Units Subcutaneous Daily  . [START ON 06/23/2015] pantoprazole (PROTONIX) IV  40 mg Intravenous Q12H  . piperacillin-tazobactam  (ZOSYN)  IV  2.25 g Intravenous Q8H  . sodium chloride  3 mL Intravenous Q12H   Continuous Infusions: . heparin 1,450 Units/hr (06/22/15 0341)  . pantoprozole (PROTONIX) infusion 8 mg/hr (06/22/15 1011)    Active Problems:   Hypertension   Leukocytosis   Elevated troponin   Hyponatremia   Hypoalbuminemia   Hyperkalemia   Rhabdomyolysis   Metabolic acidosis   Acute renal failure (ARF) ([place])   DM type 2 causing renal disease ([place])   Dehydration   Debility   Diabetic foot infection ([place])   Ileus (HCC)   SBO (small bowel obstruction) (HCC)   Gastrointestinal hemorrhage with hematemesis   Atrial fibrillation ([place])    Time spent: 30 min    David Ford, [place] [facility] Pager [phone]. If 7PM-7AM, please contact night-coverage at [email or web address], password [facility] 06/22/2015, 12:02 PM  LOS: 5 days

## 2015-06-22 NOTE — Progress Notes (Signed)
Physical Therapy Treatment Patient Details Name: David Ford MRN: [id number] DOB: 02/03/1941 Today's Date: 06/22/2015    History of Present Illness 75 yo M  Who fell and stayed on the floor for 3 days and developed rhabdo and ARF. Ptwith hx of DM, HTN Was also  found to have untreated diabetic foot ulcer on the L and SBO. Pt not medically stable enough to have either surgery at this time and is now on Dialysis.    PT Comments    Pt with medical decline therefore has suffered from a functional decline as well. Pt motivated to participate however requires modA for transfers and has decreased activity/ambulation tolerance. Pt now on HD and is unable to have surgery for L toes amputation and SBO. Pt to con't to follow to improve mobility as able.   Follow Up Recommendations  SNF;Supervision/Assistance - 24 hour     Equipment Recommendations  Rolling walker with 5" wheels    Recommendations for Other Services       Precautions / Restrictions Precautions Precautions: Fall Precaution Comments: pt with necrotic L great, 1st and second toe however not medically stable for surgery. Pt also with SBO awaiting to see if surgery needed due to poor medical status Restrictions Weight Bearing Restrictions: No    Mobility  Bed Mobility Overal bed mobility: Needs Assistance Bed Mobility: Rolling;Sidelying to Sit;Sit to Sidelying Rolling: Mod assist Sidelying to sit: Mod assist     Sit to sidelying: Mod assist General bed mobility comments: modA for trunk elevation due to bilat shoulder pain, max directional cues for log roll technique, assist for LEs back into bed  Transfers Overall transfer level: Needs assistance Equipment used: 2 person hand held assist (benefit from RW) Transfers: Sit to/from Stand Sit to Stand: Min assist         General transfer comment: minA for stability, 2nd person helpful for lines  Ambulation/Gait Ambulation/Gait assistance: Min assist;+2  safety/equipment Ambulation Distance (Feet): 20 Feet Assistive device: Rolling walker (2 wheeled) Gait Pattern/deviations: Step-through pattern;Decreased stride length (decreased step height) Gait velocity: slow   General Gait Details: pt easily fatigued,, no episodes of LOB but unsteady   Stairs            Wheelchair Mobility    Modified Rankin (Stroke Patients Only)       Balance Overall balance assessment: Needs assistance Sitting-balance support: Feet supported;No upper extremity supported Sitting balance-Leahy Scale: Fair     Standing balance support: Bilateral upper extremity supported Standing balance-Leahy Scale: Poor Standing balance comment: okay for static standing but not for ambulation                    Cognition Arousal/Alertness: Awake/alert Behavior During Therapy: Flat affect Overall Cognitive Status: Within Functional Limits for tasks assessed                      Exercises      General Comments        Pertinent Vitals/Pain Pain Assessment: 0-10 Pain Score: 10-Worst pain ever Pain Location: bilat shoulders Pain Intervention(s): Monitored during session    Home Living                      Prior Function            PT Goals (current goals can now be found in the care plan section) Acute Rehab PT Goals Patient Stated Goal: get better Progress towards PT goals: Not progressing  toward goals - comment (declining medically)    Frequency  Min 3X/week    PT Plan Current plan remains appropriate    Co-evaluation             End of Session Equipment Utilized During Treatment: Gait belt Activity Tolerance: Patient limited by fatigue Patient left: in bed;with call bell/phone within reach (pt leaving for DIA)     Time: [id number] PT Time Calculation (min) (ACUTE ONLY): 19 min  Charges:  $Gait Training: 8-22 mins                    G Codes:      [name] 06/22/2015, 3:01 PM   [name], PT, DPT Pager #: [phone] Office #: [phone]

## 2015-06-22 NOTE — Procedures (Signed)
I have personally attended this patient's dialysis session.  2K bath Temp R IJ catheter running OK Goal to keep BP > Peter, MD [place] Pager 06/22/2015, 3:56 PM

## 2015-06-22 NOTE — Progress Notes (Signed)
Upon return from hemodialysis patient's nasogastric tube appears to have bright red blood in the tubing. Auscultated for placement and notified MD of findings. Dr. Sheran Fava gave order to turn off the heparin drip and hold the low intermittent wall suction for now.  Dr. Sheran Fava stated she will be ordering abdominal x-ray to confirm placement.

## 2015-06-22 NOTE — Care Management Important Message (Signed)
Important Message  Patient Details  Name: Jayston Folgar MRN: [id number] Date of Birth: 1940/07/05   Medicare Important Message Given:  Yes    Louanne Belton 06/22/2015, 12:26 [place] Message  Patient Details  Name: Emilien Bredow MRN: [id number] Date of Birth: [birth year]   Medicare Important Message Given:  Yes    Leighton Luster, Neal Dy 06/22/2015, 12:25 PM

## 2015-06-22 NOTE — Progress Notes (Signed)
ANTICOAGULATION CONSULT NOTE - Follow Up Consult  Pharmacy Consult for Heparin + Vanco/Zosyn Indication: atrial fibrillation + sepsis  No Known Allergies  Patient Measurements: Height: 6\' 2"  (188 cm) Weight: 210 lb 5.1 oz (95.4 kg) IBW/kg (Calculated) : 82.2 Heparin Dosing Weight:    Vital Signs: Temp: 97.6 F (36.4 C) (01/02 1354) Temp Source: Oral (01/02 1354) BP: 134/76 mmHg (01/02 1430) Pulse Rate: 89 (01/02 1430)  Labs:  Recent Labs  06/20/15 0423 06/20/15 0425 06/20/15 1257 06/20/15 1730 06/21/15 0201 06/21/15 0203 06/21/15 1027 06/21/15 1725 06/22/15 0530  HGB  --  10.9* 11.3*  --   --  11.2*  --   --  11.2*  HCT  --  31.3* 33.1*  --   --  32.3*  --   --  32.8*  PLT  --  222  --   --   --  273  --   --  292  HEPARINUNFRC  --   --   --   --  0.41  --  0.63  --  0.50  CREATININE  --  8.96*  --  6.45* 7.49*  --   --  9.14* 9.94*  TROPONINI 0.06*  --   --  0.06* 0.20*  --   --   --   --     Estimated Creatinine Clearance: 7.7 mL/min (by C-G formula based on Cr of 9.94).   Assessment:  Anticoagulation: Heparin for new Afib. Seemingly self-limiting coffee ground emesis from NG tube. GI consulted and following. Heparin level remains therapeutic at 0.5 on 1450 units/hr. Will continue same rate. Hgb stable at 11.2, plts wnl. No further bleeding noted.   ID: Vanc and Zosyn for sepsis 2/2 DM foot ulcer/gangrene Surgery planned once patient stabilized. SCr continues to trend upward; started on iHD 12/31 for uremia. Currently afebrile, WBC 19.0. LA normal 12/30. PC 29.67. Received 2g load of vancomycin followed by 1g post-HD 12/31.  12/28 Zosyn >> 12/28 Vancomycin  >>   12/29 UCx: NGF MRSA screen neg   Goal of Therapy:  Heparin level 0.3-0.7 units/ml Monitor platelets by anticoagulation protocol: Yes   Plan:  Continue heparin at 1450 units/hr Vancomycin 1 gram IV --give today with HD - f/u toleration and length of HD session today 1/2. - F/U HD plans for  further vancomycin doses (none scheduled) Zosyn 2.25 gm Q8H   Esra Frankowski S. Alford Highland, PharmD, BCPS Clinical Staff Pharmacist Pager [phone]  Eilene Ghazi [place] 06/22/2015,2:49 PM

## 2015-06-22 NOTE — Progress Notes (Signed)
[facility] Rounding Note   Subjective:  Pt more awake this AM HD 12/31  had AFib with RVR Rate controlled with BB (IV prn) Creatinine and BUN rising.  Minimal UOP  01/01 0701 - 01/02 0700 In: 160 [NG/GT:60; IV Piggyback:100] Out: 1350 [Emesis/NG output:1350]   Physical Exam:  BP 143/71 mmHg  Pulse 77  Temp(Src) 98.6 F (37 C) (Axillary)  Resp 12  Ht 6\' 2"  (1.88 m)  Wt 96.1 kg (211 lb 13.8 oz)  BMI 27.19 kg/m2  SpO2 96%  GEN: NAD, elderly male lying in bed. Very pleasant.  Wants water ENT: NGTube  in place with bilious contents CV: Irreg rate 70's No rub PULM: Ant clear.Diminished at bases ABD: Mod abd distension. Bowel sounds absent.  [id number] edema LE's R IJ Temp HD cath present (12/30)  Scheduled Meds: . antiseptic oral rinse  7 mL Mouth Rinse q12n4p  . chlorhexidine  15 mL Mouth Rinse BID  . insulin aspart  0-9 Units Subcutaneous 4 times per day  . insulin glargine  10 Units Subcutaneous Daily  . [START ON 06/23/2015] pantoprazole (PROTONIX) IV  40 mg Intravenous Q12H  . piperacillin-tazobactam (ZOSYN)  IV  2.25 g Intravenous Q8H  . sodium chloride  3 mL Intravenous Q12H   Continuous Infusions: . heparin 1,450 Units/hr (06/22/15 0341)  . pantoprozole (PROTONIX) infusion 8 mg/hr (06/22/15 1011)   PRN Meds:.dextrose, diphenhydrAMINE, HYDROmorphone (DILAUDID) injection, metoprolol, ondansetron (ZOFRAN) IV    Recent Labs Lab 06/21/15 0201 06/21/15 1725 06/22/15 0530  NA 136 137 137  K 4.6 4.3 4.3  CL 95* 97* 97*  CO2 23 24 22   GLUCOSE 180* 133* 113*  BUN 73* 90* 103*  CREATININE 7.49* 9.14* 9.94*  CALCIUM 8.1* 8.2* 8.4*  PHOS 6.7* 6.0* 6.7*    Recent Labs Lab 06/20/15 0425 06/20/15 1257 06/21/15 0203 06/22/15 0530  WBC 19.0*  --  20.2* 19.0*  HGB 10.9* 11.3* 11.2* 11.2*  HCT 31.3* 33.1* 32.3* 32.8*  MCV 79.8  --  80.8 80.4  PLT 222  --  273 292    Background: 71M with renal insufficiency, unclear duration (creatinine 2.0  04/2015), mild hyperkalemia, admitted after down on ground for 3d with creatinine of 4; on ACEi; with mild rhabdomyolysis; septic with diabetic foot ulcer/gangrene. Hospital course complicated by AF/RVR, GIB. Progressive AKI, oligoanuric. HD initiated 12/31 (R IJ tem cath 12/30)  ASSESSMENT/RECOMMENDATIONS  1. Anuric AoCKD3 (SCr 04/2015 2.0; suspect diabteic disease) with subnephrotic proteinuria; not obstructed, foley in place. Rhabdo/gangrenous foot w/sepsis/vol depletion/ACI-->ischemic ATN.  s/p R IJ Temp HD cath 06/19/15 and started HD 12/31. Negligible UOP. Plan HD again today. 2. Diabetic foot ulcer/gangrene of left 1st 2 toes - ferred until more stable per Dr. Sharol Given 3. Sepsis d/t #2. Vanco and zosyn 4. Partial SBO with partial small bowel malrotation.  NGT. Still no return of bowel function. Surgeons thinking possibly d/t ischemia. Hoping to avoid surgery. 5. UGIB with NGT in place. Felt gastritis/asa/tube trauma/uremia. Hb stable; GI s/o. On protonix drip. 6. Leukocytosis - persistent 7. Mild rhabdomyolysis at admission - resolved 8. DM2 9. HTN on ACEi (now held) 10. AFib with RVR - occurred with HD 12/31. BB for rate control (IV prn). 11. + trops. ? Demand ischemia. EF 50-55% 12. Dehydration, now volume replete 13. Debility / Loss of independence    Jamal Maes, MD [facility] [phone] Pager 06/22/2015, 11:18 AM

## 2015-06-22 NOTE — Clinical Documentation Improvement (Signed)
Internal Medicine  Please clarify if Sepsis was present or evolving on admission as the diagnosis is not seen on Problem List. Admission order notes: ARF with documentation supporting diagnosis.. Document findings in next progress note; NOT in BPA drop down box.    Sepsis was present or evolving on admission- Coders/CDI cannot assume anything; must be documented   Sepsis was acquired after admission  Other  Clinically Undetermined  Supporting Information:  Procalcitonin level drawn on 12/28 was 35.32; Lactate was 1.7  WBC = 16.2  Organ Failure with ARF possibly due to Rhabdomyolysis, DM2 with progressive CKD; on HD  Please exercise your independent, professional judgment when responding. A specific answer is not anticipated or expected.  Thank You, Zoila Shutter RN, BSN, [place] [phone]; Cell: [phone]

## 2015-06-22 NOTE — Progress Notes (Signed)
OT Cancellation Note  Patient Details Name: David Ford MRN: [id number] DOB: [birth year]   Cancelled Treatment:    Reason Eval/Treat Not Completed: Patient at procedure or test/ unavailable - will reattempt.   Darlina Rumpf [place], OTR/L [id number]  06/22/2015, 3:07 PM

## 2015-06-22 NOTE — Progress Notes (Signed)
Called by RN that NG has bright red blood.   -  Clamp NG -  Turn off heparin gtt -  KUB to confirm placement -  Aspiration precautions

## 2015-06-22 NOTE — Progress Notes (Signed)
Subjective: Awake, alert, no bowel function, no abdominal pain  Objective: Vital signs in last 24 hours: Temp:  [98.2 F (36.8 C)-98.9 F (37.2 C)] 98.6 F (37 C) (01/02 0732) Pulse Rate:  [56-105] 105 (01/02 0000) Resp:  [11-22] 11 (01/02 0000) BP: (118-124)/(73-83) 124/83 mmHg (01/02 0000) SpO2:  [91 %-97 %] 97 % (01/02 0000) Last BM Date: 06/21/15  Intake/Output from previous day: 01/01 0701 - 01/02 0700 In: 160 [NG/GT:60; IV Piggyback:100] Out: 1350 [Emesis/NG output:1350] Intake/Output this shift:    General appearance: no distress Resp: clear to auscultation bilaterally Cardio: regular rate and rhythm GI: moderate distention, no bs nontender  Lab Results:   Recent Labs  06/21/15 0203 06/22/15 0530  WBC 20.2* 19.0*  HGB 11.2* 11.2*  HCT 32.3* 32.8*  PLT 273 292   BMET  Recent Labs  06/21/15 1725 06/22/15 0530  NA 137 137  K 4.3 4.3  CL 97* 97*  CO2 24 22  GLUCOSE 133* 113*  BUN 90* 103*  CREATININE 9.14* 9.94*  CALCIUM 8.2* 8.4*   PT/INR No results for input(s): LABPROT, INR in the last 72 hours. ABG No results for input(s): PHART, HCO3 in the last 72 hours.  Invalid input(s): PCO2, PO2  Studies/Results: Dg Abd Portable 1v  06/22/2015  CLINICAL DATA:  75 year old male with abdominal pain and distention. Small bowel obstruction. EXAM: PORTABLE ABDOMEN - 1 VIEW COMPARISON:  Abdominal radiograph 06/21/2015. FINDINGS: Nasogastric tube coiled in the stomach with tip in the region of the cardia. Gaseous distention throughout the small bowel which is dilated up to 5.9 cm in diameter. Some gas is also noted throughout the colon. No gross evidence of pneumoperitoneum. Calcific density projecting over the left ilium, presumably something ingested. This is move slightly compared to yesterday's examination, and is new compared to prior study 06/20/2015. IMPRESSION: 1. Findings remain compatible with partial small bowel obstruction, as above. 2. No gross  evidence of pneumoperitoneum on this single supine view. 3. Nasogastric tube is coiled back upon itself with tip in the cardia of the stomach. Electronically Signed   By: Vinnie Langton M.D.   On: 06/22/2015 08:01   Dg Abd Portable 1v  06/21/2015  CLINICAL DATA:  Small bowel obstruction EXAM: PORTABLE ABDOMEN - 1 VIEW COMPARISON:  06/20/2015 FINDINGS: Nasogastric tube is in place with tip overlying the level of the stomach. Numerous dilated small bowel loops are again identified. There is paucity of large bowel gas, identified within nondilated loops. No evidence for free intraperitoneal air on this supine view of the abdomen. Note is made of prostatic seeds. IMPRESSION: Persistent, stable dilatation of small bowel loops consistent with partial small bowel obstruction. Electronically Signed   By: Nolon Nations M.D.   On: 06/21/2015 08:56    Anti-infectives: Anti-infectives    Start     Dose/Rate Route Frequency Ordered Stop   06/20/15 1400  vancomycin (VANCOCIN) IVPB 1000 mg/200 mL premix     1,000 mg 200 mL/hr over 60 Minutes Intravenous  Once 06/20/15 1205 06/20/15 1538   06/19/15 2100  vancomycin (VANCOCIN) IVPB 1000 mg/200 mL premix  Status:  Discontinued     1,000 mg 200 mL/hr over 60 Minutes Intravenous Every 48 hours 06/17/15 2214 06/18/15 0945   06/18/15 1200  vancomycin (VANCOCIN) IVPB 1000 mg/200 mL premix  Status:  Discontinued     1,000 mg 200 mL/hr over 60 Minutes Intravenous Every 48 hours 06/18/15 0945 06/20/15 1204   06/18/15 0530  piperacillin-tazobactam (ZOSYN) IVPB 2.25 g  2.25 g 100 mL/hr over 30 Minutes Intravenous Every 8 hours 06/17/15 2214     06/17/15 2115  piperacillin-tazobactam (ZOSYN) IVPB 3.375 g     3.375 g 100 mL/hr over 30 Minutes Intravenous  Once 06/17/15 2111 06/17/15 2151   06/17/15 2115  vancomycin (VANCOCIN) IVPB 1000 mg/200 mL premix     1,000 mg 200 mL/hr over 60 Minutes Intravenous  Once 06/17/15 2111 06/18/15 0235       Assessment/Plan: sbo  Im not sure of why he is obstructed still ct has abnl cecum.  He does have csc in last couple years. I still think this historically is likely ischemic and given him chance to resolve nonoperatively reasonable.  If he does not get better he may end up needing surgery though. Will check films again in am.  [facility] 06/22/2015

## 2015-06-23 ENCOUNTER — Inpatient Hospital Stay (HOSPITAL_COMMUNITY): Payer: Medicare HMO

## 2015-06-23 DIAGNOSIS — L089 Local infection of the skin and subcutaneous tissue, unspecified: Secondary | ICD-10-CM

## 2015-06-23 DIAGNOSIS — E1169 Type 2 diabetes mellitus with other specified complication: Secondary | ICD-10-CM

## 2015-06-23 LAB — CBC
HCT: 29.2 % — ABNORMAL LOW (ref 39.0–52.0)
Hemoglobin: 9.8 g/dL — ABNORMAL LOW (ref 13.0–17.0)
MCH: 27.1 pg (ref 26.0–34.0)
MCHC: 33.6 g/dL (ref 30.0–36.0)
MCV: 80.9 fL (ref 78.0–100.0)
PLATELETS: 300 10*3/uL (ref 150–400)
RBC: 3.61 MIL/uL — AB (ref 4.22–5.81)
RDW: 14 % (ref 11.5–15.5)
WBC: 18.9 10*3/uL — AB (ref 4.0–10.5)

## 2015-06-23 LAB — HEPATITIS B SURFACE ANTIGEN: HEP B S AG: NEGATIVE

## 2015-06-23 LAB — RENAL FUNCTION PANEL
ALBUMIN: 1.8 g/dL — AB (ref 3.5–5.0)
Anion gap: 16 — ABNORMAL HIGH (ref 5–15)
BUN: 63 mg/dL — AB (ref 6–20)
CALCIUM: 8.4 mg/dL — AB (ref 8.9–10.3)
CO2: 24 mmol/L (ref 22–32)
CREATININE: 6.88 mg/dL — AB (ref 0.61–1.24)
Chloride: 97 mmol/L — ABNORMAL LOW (ref 101–111)
GFR, EST AFRICAN AMERICAN: 8 mL/min — AB (ref >60)
GFR, EST NON AFRICAN AMERICAN: 7 mL/min — AB (ref >60)
Glucose, Bld: 159 mg/dL — ABNORMAL HIGH (ref 65–99)
Phosphorus: 5.6 mg/dL — ABNORMAL HIGH (ref 2.5–4.6)
Potassium: 3.9 mmol/L (ref 3.5–5.1)
SODIUM: 137 mmol/L (ref 135–145)

## 2015-06-23 LAB — GLUCOSE, CAPILLARY
GLUCOSE-CAPILLARY: 155 mg/dL — AB (ref 65–99)
GLUCOSE-CAPILLARY: 161 mg/dL — AB (ref 65–99)
GLUCOSE-CAPILLARY: 137 mg/dL — AB (ref 65–99)
Glucose-Capillary: 139 mg/dL — ABNORMAL HIGH (ref 65–99)
Glucose-Capillary: 171 mg/dL — ABNORMAL HIGH (ref 65–99)

## 2015-06-23 MED ORDER — DIATRIZOATE MEGLUMINE & SODIUM 66-10 % PO SOLN
90.0000 mL | Freq: Once | ORAL | Status: AC
Start: 2015-06-23 — End: 2015-06-23
  Administered 2015-06-23: 90 mL via NASOGASTRIC

## 2015-06-23 MED ORDER — HEPARIN SODIUM (PORCINE) 5000 UNIT/ML IJ SOLN
5000.0000 [IU] | Freq: Three times a day (TID) | INTRAMUSCULAR | Status: DC
  Administered 2015-06-23 – 2015-06-24 (×3): 5000 [IU] via SUBCUTANEOUS
  Filled 2015-06-23 (×4): qty 1

## 2015-06-23 MED ORDER — DIATRIZOATE MEGLUMINE & SODIUM 66-10 % PO SOLN
ORAL | Status: AC
  Administered 2015-06-23: 90 mL via NASOGASTRIC
  Filled 2015-06-23: qty 90

## 2015-06-23 MED ORDER — CHLORHEXIDINE GLUCONATE 0.12 % MT SOLN: 15.0000 mL | Freq: Two times a day (BID) | OROMUCOSAL | Status: DC

## 2015-06-23 MED ORDER — CETYLPYRIDINIUM CHLORIDE 0.05 % MT LIQD: 7.0000 mL | Freq: Two times a day (BID) | OROMUCOSAL | Status: DC

## 2015-06-23 NOTE — Progress Notes (Signed)
Called Dr Sheran Fava with xray results, instructed to leave Heparin drip off and resume LIWS and observe for any further bleeding. Pt has no complaints of pain or nausea, resting at this time. Will continue to monitor

## 2015-06-23 NOTE — Progress Notes (Signed)
[facility] Rounding Note  Subjective:  Awake and alert. Asking about dialysis. Had a BM today! HD [id number] complicated by AFib with RVR Tolerated HD 1/2 well. Minimal UOP  01/02 0701 - 01/03 0700 In: 90 [NG/GT:40; IV Piggyback:50] Out: [id number] [Urine:50; Emesis/NG output:400]   Physical Exam:  BP 144/72 mmHg  Pulse 83  Temp(Src) 99.1 F (37.3 C) (Oral)  Resp 18  Ht 6\' 2"  (1.88 m)  Wt 94.4 kg (208 lb 1.8 oz)  BMI 26.71 kg/m2  SpO2 97%  GEN: NAD, elderly male lying in bed. Very pleasant.   ENT: NGTube  CV: Irreg rate 70's No rub PULM: Ant clear.Diminished at bases ABD: Mod abd distension. Still no BS EXT:No edema. Foot not examined. R IJ Temp HD cath present (12/30)  Scheduled Meds: . antiseptic oral rinse  7 mL Mouth Rinse q12n4p  . brimonidine  1 drop Both Eyes BID  . chlorhexidine  15 mL Mouth Rinse BID  . diatrizoate meglumine-sodium  90 mL Per NG tube Once  . insulin aspart  0-9 Units Subcutaneous 4 times per day  . insulin glargine  10 Units Subcutaneous Daily  . latanoprost  1 drop Both Eyes QHS  . pantoprazole (PROTONIX) IV  40 mg Intravenous Q12H  . piperacillin-tazobactam (ZOSYN)  IV  2.25 g Intravenous Q8H  . sodium chloride  3 mL Intravenous Q12H      PRN Meds:.dextrose, diphenhydrAMINE, HYDROmorphone (DILAUDID) injection, metoprolol, ondansetron (ZOFRAN) IV    Recent Labs Lab 06/21/15 1725 06/22/15 0530 06/23/15 0420  NA 137 137 137  K 4.3 4.3 3.9  CL 97* 97* 97*  CO2 24 22 24   GLUCOSE 133* 113* 159*  BUN 90* 103* 63*  CREATININE 9.14* 9.94* 6.88*  CALCIUM 8.2* 8.4* 8.4*  PHOS 6.0* 6.7* 5.6*    Recent Labs Lab 06/21/15 0203 06/22/15 0530 06/23/15 0420  WBC 20.2* 19.0* 18.9*  HGB 11.2* 11.2* 9.8*  HCT 32.3* 32.8* 29.2*  MCV 80.8 80.4 80.9  PLT 273 292 300    Background: 23M with renal insufficiency, unclear duration (creatinine 2.0 04/2015), mild hyperkalemia, admitted after down on ground for 3d with creatinine of 4;  on ACEi; with mild rhabdomyolysis; septic with diabetic foot ulcer/gangrene. Hospital course complicated by AF/RVR, GIB. Progressive AKI, oligoanuric. HD initiated 12/31 (R IJ tem cath 12/30)  ASSESSMENT/RECOMMENDATIONS  1. Anuric AoCKD3 (SCr 04/2015 2.0; suspect diabetic nephropathy with baseline CKD3) with subnephrotic proteinuria; not obstructed, foley in place. Rhabdo/gangrenous foot w/sepsis/vol depletion/ACI-->all led to ischemic ATN.  s/p R IJ Temp HD cath 06/19/15 and started HD 12/31.  Still negligible UOP. Had HD 1/2. Anticipate HD 1/4. 2. Diabetic foot ulcer/gangrene of left 1st 2 toes - deferred until more stable per Dr. Sharol Given 3. Sepsis d/t #2. Vanco and zosyn 4. Partial SBO with partial small bowel malrotation.  NGT. Had BM today!  Surgeons thinking bowel issue possibly d/t ischemia. Hoping to avoid surgery. 5. UGIB with NGT in place. Felt gastritis/asa/tube trauma/uremia. Hb drop 11.3->9.8 since yesterday with some more bloody NG drainage yesterday. Heparin off. On IV protonix 6. Leukocytosis - persistent 7. Mild rhabdomyolysis at admission - resolved 8. DM2 9. HTN on ACEi (now held) 10. AFib with RVR - occurred with HD 12/31. BB for rate control (IV prn). 11. + trops. ? Demand ischemia. EF 50-55% 12. Dehydration, now volume replete 13. Debility / Loss of independence    David Maes, MD [facility] [phone] Pager 06/23/2015, 11:41 AM

## 2015-06-23 NOTE — Progress Notes (Signed)
[facility] PROGRESS NOTE  David Ford [id number] DOB: [birth year] DOA: 06/17/2015 PCP: No primary care provider on file.  Brief Summary  David Ford is a 75 y.o. male with history of diabetes mellitus with noncompliance, memory loss, hypertension.  Patient is a poor historian and gives a different history each time he is asked about the events preceding admission. Patient lives at home alone.  He was found by a friend on the floor who called EMS.  His daughter reported that he had had swelling and darkness of the toes on his left foot for several months.  When she saw his toes during this admission, however, they appeared much more swollen and malodorous than before. Emergency department he was found to have evidence of rhabdomyolysis with CKs 3053, his initial lab work showing creatinine of 4 potassium was 6.0 and an gap was 16.  He was unable to produce urine. Troponin slightly elevated to 0.05 albumin 2.9, white blood cell, was noted to be elevated 16.2.  Hospitalist was called for admission for dehydration rhabdomyolysis evidence of acute renal failure.  12/29:  Cardiology recommends outpatient lexiscan 12/30:  Anuric, plan to start HD.  SBO with GIB > GI consulted.  Transfer to stepdown.  Toe amputation deferred.  HD catheter placed by PCCM 12/31:  Started HD and developed a-fib  1/2:  spontaneously reverted to NSR   Assessment/Plan  Sepsis, present at time of admission, due to diabetic foot ulcer with gangrene (tachycardia and leukocytosis) -  Continue vancomycin and zosyn -  Dr. Sharol Given assistance appreciated  Partial SBO, localized to left mid-abdomen with partial malrotation of the small bowel, possible due to bowel ischemia -  KUB this morning shows persistent pSBO -  NG to LIS -  Will clearly be a prolonged recovery > need to start IV nutrition  Upper GIB, recurred overnight. Suspect some gastritis secondary to uremia, ASA and possibly some trauma from NG insertion -   Change to BID PPI -  hgb trended down some, but no need for transfusion and output appears bilious currently -  D/c a/c  Anuric ARF, likely due to ACEI, progressive diabetic nephropathy, still anuric,  -  Appreciate nephrology assistance -  hemodialysis started 12/31, s/p 2 treatments  Cirrhotic appearing liver, ammonia 21, labs not consistent with end-stage disease  Abnormal appearance of the cecum with stranding, suspicious for neoplasm vs. Colitis  -  Defer workup at this time secondary to other acute illness -  May need repeat imaging and/or colonoscopy with biopsy  Paroxysmal atrial fibrillation, rate in low 100s, reverted to SR on 1/2 -  CHADs2vasc = 4  -  Metoprolol prn -  D/c heparin for now due to recurrent UGIB  Probable demand ischemia with mildly elevated troponin in setting of renal failure and infection -  ECHO:  50-55% EF with mild to mod LVH, no regional wall motion abnl, grade 1 DD -  Cardiology recommending outpatient work up -  Troponins likely mildly elevated due to CKD and demand  Mild gap metabolic acidosis, hyponatremia, hyperkalemia, 2/2 AKI and resolved with HD  Hypertension, blood pressures stable -  Continue to hold blood pressure medications -  Consider scheduled metoprolol IV   Leukocytosis due to infection and gangrene and plateauing -  Trend while on antibiotics  Diabetes mellitus type 2, A1c 14.2, CBG at goal -  Continue SSI q6h while NPO -  Continue 10 units lantus  Bilateral shoulder pain, seems to be impingement or rotator cuff injury.  Exam not suggestive of intraarticular problem -  XR right shoulder:  No fracture, but difficult exam  -  Consider MRI as outpatient  Hx of prostate cancer, seed implantation  Microcytic anemia -  Iron studies c/w iron deficiency, B12 963, folate wnl -  TSH 0.653  Diet:  NPO Access:  PIV IVF:  off Proph:  SCDs  Code Status: full Family Communication: patient alone Disposition Plan:  Still very  ill, prognosis tenuous  Consultants:  Orthopedic surgery  Nephrology  Cardiology  Procedures:  RUS  ECHO  CT abd/pelvis  Antibiotics:  Zosyn 12/28 >  Vanc 12/29 >   HPI/Subjective:  Abdominal pain about the same as yesterday.  Still not sleeping.  No uop.  Denies CP and SOB  Objective: Filed Vitals:   06/23/15 0259 06/23/15 0300 06/23/15 0733 06/23/15 1127  BP:  144/72    Pulse:  83    Temp: 98.7 F (37.1 C)  98.1 F (36.7 C) 99.1 F (37.3 C)  TempSrc: Oral  Oral Oral  Resp:  18    Height:      Weight:      SpO2:  97%      Intake/Output Summary (Last 24 hours) at 06/23/15 1212 Last data filed at 06/23/15 1136  Gross per 24 hour  Intake     90 ml  Output   2060 ml  Net  -1970 ml   Filed Weights   06/20/15 1515 06/22/15 1354 06/22/15 1745  Weight: 96.1 kg (211 lb 13.8 oz) 95.4 kg (210 lb 5.1 oz) 94.4 kg (208 lb 1.8 oz)   Body mass index is 26.71 kg/(m^2).  Exam:   General:  Adult male, No acute distress  HEENT:  NCAT, MMM  Cardiovascular:  RRR, nl S1, S2 no mrg, warm extremities  Respiratory:  CTAB  Abdomen:   Hypoactive BS, moderately distended, TTP in lower quadrants without rebound or guarding.  Soft  MSK:   Normal tone and bulk, left foot swollen, malodorous, dressed and not fully examined today  Data Reviewed: Basic Metabolic Panel:  Recent Labs Lab 06/17/15 2235  06/18/15 0408  06/20/15 1730 06/21/15 0201 06/21/15 1725 06/22/15 0530 06/23/15 0420  NA 133*  < > 133*  < > 134* 136 137 137 137  K 6.3*  < > 5.0  < > 3.8 4.6 4.3 4.3 3.9  CL 95*  < > 98*  < > 93* 95* 97* 97* 97*  CO2 23  < > 23  < > 24 23 24 22 24   GLUCOSE 332*  < > 179*  < > 152* 180* 133* 113* 159*  BUN 64*  < > 66*  < > 62* 73* 90* 103* 63*  CREATININE 4.18*  < > 4.75*  < > 6.45* 7.49* 9.14* 9.94* 6.88*  CALCIUM 9.1  < > 8.9  < > 8.2* 8.1* 8.2* 8.4* 8.4*  MG 2.2  --  2.2  --   --   --   --   --   --   PHOS 6.0*  --  5.7*  < > 5.3* 6.7* 6.0* 6.7* 5.6*  < >  = values in this interval not displayed. Liver Function Tests:  Recent Labs Lab 06/17/15 1708 06/18/15 0408  06/19/15 1430  06/20/15 1730 06/21/15 0201 06/21/15 1725 06/22/15 0530 06/23/15 0420  AST 73* 68*  --  69*  --   --   --   --   --   --   ALT 29  28  --  45  --   --   --   --   --   --   ALKPHOS 91 91  --  83  --   --   --   --   --   --   BILITOT 0.6 0.5  --  1.0  --   --   --   --   --   --   PROT 6.6 5.8*  --  5.7*  --   --   --   --   --   --   ALBUMIN 2.9* 2.6*  < > 2.1*  < > 2.0* 1.8* 1.9* 1.9* 1.8*  < > = values in this interval not displayed. No results for input(s): LIPASE, AMYLASE in the last 168 hours.  Recent Labs Lab 06/20/15 0421  AMMONIA 21   CBC:  Recent Labs Lab 06/19/15 1430  06/20/15 0425 06/20/15 1257 06/21/15 0203 06/22/15 0530 06/23/15 0420  WBC 15.1*  --  19.0*  --  20.2* 19.0* 18.9*  HGB 10.8*  < > 10.9* 11.3* 11.2* 11.2* 9.8*  HCT 32.0*  < > 31.3* 33.1* 32.3* 32.8* 29.2*  MCV 82.9  --  79.8  --  80.8 80.4 80.9  PLT 214  --  222  --  273 292 300  < > = values in this interval not displayed.  Recent Results (from the past 240 hour(s))  Urine culture     Status: None   Collection Time: 06/18/15  1:18 AM  Result Value Ref Range Status   Specimen Description URINE, CATHETERIZED  Final   Special Requests NONE  Final   Culture NO GROWTH 1 DAY  Final   Report Status 06/19/2015 FINAL  Final  Surgical pcr screen     Status: None   Collection Time: 06/19/15  5:25 AM  Result Value Ref Range Status   MRSA, PCR NEGATIVE NEGATIVE Final   Staphylococcus aureus NEGATIVE NEGATIVE Final    Comment:        The Xpert SA Assay (FDA approved for NASAL specimens in patients over 68 years of age), is one component of a comprehensive surveillance program.  Test performance has been validated by [facility] for patients greater than or equal to 84 year old. It is not intended to diagnose infection nor to guide or monitor treatment.       Studies: Dg Abd Portable 1v  06/23/2015  CLINICAL DATA:  Small bowel obstruction EXAM: PORTABLE ABDOMEN - 1 VIEW COMPARISON:  06/22/2015 FINDINGS: Loops of small bowel are similar to the prior exam and measuring up to 5.8 cm in diameter, previously the same by my measurements. There is gas within nondilated transverse colon. Nasogastric tube remains coiled in the stomach. IMPRESSION: 1. Similar degree of dilatation of small bowel loops, measuring up to 5.8 cm in diameter. Given the lack of colonic dilatation, appearance favors obstruction over ileus. Electronically Signed   By: Van Clines M.D.   On: 06/23/2015 07:58   Dg Abd Portable 1v  06/22/2015  CLINICAL DATA:  NG tube placement.  Hemoptysis. EXAM: PORTABLE ABDOMEN - 1 VIEW COMPARISON:  Radiographs earlier this day at 0550 hour FINDINGS: Tip and side-port of the enteric tube below the diaphragm, tip directed towards the fundus, unchanged from prior exam. Diffusely dilated small bowel loops in the central abdomen, with equivocal improvement, greatest dimension 5.3 cm, previously 5.9 cm. No evidence of free air. IMPRESSION: Persistent small-bowel obstruction, with equivocal improvement and  decrease in degree of bowel distention. Enteric tube remains in place. Electronically Signed   By: Jeb Levering M.D.   On: 06/22/2015 19:32   Dg Abd Portable 1v  06/22/2015  CLINICAL DATA:  75 year old male with abdominal pain and distention. Small bowel obstruction. EXAM: PORTABLE ABDOMEN - 1 VIEW COMPARISON:  Abdominal radiograph 06/21/2015. FINDINGS: Nasogastric tube coiled in the stomach with tip in the region of the cardia. Gaseous distention throughout the small bowel which is dilated up to 5.9 cm in diameter. Some gas is also noted throughout the colon. No gross evidence of pneumoperitoneum. Calcific density projecting over the left ilium, presumably something ingested. This is move slightly compared to yesterday's examination, and is new compared to  prior study 06/20/2015. IMPRESSION: 1. Findings remain compatible with partial small bowel obstruction, as above. 2. No gross evidence of pneumoperitoneum on this single supine view. 3. Nasogastric tube is coiled back upon itself with tip in the cardia of the stomach. Electronically Signed   By: Vinnie Langton M.D.   On: 06/22/2015 08:01    Scheduled Meds: . antiseptic oral rinse  7 mL Mouth Rinse q12n4p  . brimonidine  1 drop Both Eyes BID  . chlorhexidine  15 mL Mouth Rinse BID  . diatrizoate meglumine-sodium  90 mL Per NG tube Once  . diatrizoate meglumine-sodium      . insulin aspart  0-9 Units Subcutaneous 4 times per day  . insulin glargine  10 Units Subcutaneous Daily  . latanoprost  1 drop Both Eyes QHS  . pantoprazole (PROTONIX) IV  40 mg Intravenous Q12H  . piperacillin-tazobactam (ZOSYN)  IV  2.25 g Intravenous Q8H  . sodium chloride  3 mL Intravenous Q12H   Continuous Infusions:    Active Problems:   Hypertension   Leukocytosis   Elevated troponin   Hyponatremia   Hypoalbuminemia   Hyperkalemia   Rhabdomyolysis   Metabolic acidosis   Acute renal failure (ARF) (HCC)   DM type 2 causing renal disease ([place])   Dehydration   Debility   Diabetic foot infection (HCC)   Ileus (HCC)   SBO (small bowel obstruction) (HCC)   Gastrointestinal hemorrhage with hematemesis   Atrial fibrillation ([place])    Time spent: 30 min    Kieley Akter, [place] [facility] Pager [phone]. If 7PM-7AM, please contact night-coverage at [email or web address], password [email or web address] 06/23/2015, 12:12 PM  LOS: 6 days

## 2015-06-23 NOTE — Care Management Note (Addendum)
Case Management Note  Patient Details  Name: David Ford MRN: [id number] Date of Birth: 24-Jan-1941  Subjective/Objective:        Dehydration, rhabdomyolysis, acute renal failure             Action/Plan: CSW following for SNF placement. [place], [place] # [phone] and dtr, [name] [phone] discussed with CSW placement. Pt lives at home alone.    Expected Discharge Date:                  Expected Discharge Plan:  Skilled Nursing Facility  In-House Referral:  Clinical Social Work  Discharge planning Services  CM Consult  Post Acute Care Choice:  NA Choice offered to:  NA  DME Arranged:  N/A DME Agency:  NA  HH Arranged:  NA HH Agency:  NA  Status of Service:  Completed, signed off  Medicare Important Message Given:  Yes Date Medicare IM Given:    Medicare IM give by:    Date Additional Medicare IM Given:    Additional Medicare Important Message give by:     If discussed at [place] of Stay Meetings, dates discussed:    Additional Comments:  Erenest Rasher, RN 06/23/2015, 5:15 PM

## 2015-06-23 NOTE — Progress Notes (Signed)
Occupational Therapy Evaluation Patient Details Name: David Ford MRN: [id number] DOB: [birth year] Today's Date: 06/23/2015    History of Present Illness 74 yo M with hx of DM, HTN Here with generalized weakness was found to have ARF most likely due to dehydration in the setting of ACEi and was found to have untreated diabetic foot ulcer. rhabdomyolysis . ARF. SBO. Awaiting ray amputation of L foot when medically stable.    Clinical Impression   PTA, pt lived alone and was independent with ADL and mobility. Pt presents with significant functional decline due to below deficits. Pt will benefit form OT at a SNF to facilitate independence with ADL and mobility and further assess pt's ability to live alone or if an ALF is more appropriate. Will follow acutely to address established goals and maximize functional level of independence.    Follow Up Recommendations  SNF;Supervision/Assistance - 24 hour    Equipment Recommendations  Other (comment) (TBA at SNF)    Recommendations for Other Services       Precautions / Restrictions Precautions Precautions: Fall Precaution Comments: pt with necrotic L great, 1st and second toe however not medically stable for surgery. Pt also with SBO awaiting to see if surgery needed due to poor medical status Restrictions Weight Bearing Restrictions: No      Mobility Bed Mobility Overal bed mobility: Needs Assistance Bed Mobility: Sit to Supine       Sit to supine: Min guard      Transfers Overall transfer level: Needs assistance Equipment used: 1 person hand held assist (benefit from RW)   Sit to Stand: Min assist              Balance     Sitting balance-Leahy Scale: Fair       Standing balance-Leahy Scale: Poor                              ADL Overall ADL's : Needs assistance/impaired     Grooming: Minimal assistance   Upper Body Bathing: Minimal assitance   Lower Body Bathing: Moderate assistance;Sit  to/from stand   Upper Body Dressing : Moderate assistance   Lower Body Dressing: Moderate assistance;Sit to/from stand   Toilet Transfer: Minimal assistance;Stand-pivot   Toileting- Clothing Manipulation and Hygiene: Maximal assistance Toileting - Clothing Manipulation Details (indicate cue type and reason): incontinent of BM     Functional mobility during ADLs: Minimal assistance;Cueing for safety General ADL Comments: Pt staes he was on the floor for 1 day before he was found. Chart states 3 days with him crawling around on the floor to get food; pt talked to people on the phone but did not ask for assistance to get up off floor. Unsure of accurate history.       Vision     Perception     Praxis      Pertinent Vitals/Pain Pain Assessment: 0-10 Pain Score: 8  Pain Location: B shoulders Pain Descriptors / Indicators: Aching Pain Intervention(s): Limited activity within patient's tolerance     Hand Dominance Right   Extremity/Trunk Assessment Upper Extremity Assessment Upper Extremity Assessment: RUE deficits/detail;LUE deficits/detail RUE Deficits / Details: generaized weakness @ 3+/5. limited shoulder flexion to around 90 due to pain LUE Deficits / Details: ROM better than R. AROM overall WFL. strength @4 /5 distally and 3+/5 proximally   Lower Extremity Assessment Lower Extremity Assessment: Defer to PT evaluation   Cervical / Trunk Assessment Cervical / Trunk  Assessment: Normal   Communication Communication Communication: No difficulties   Cognition Arousal/Alertness: Awake/alert Behavior During Therapy: Flat affect Overall Cognitive Status: No family/caregiver present to determine baseline cognitive functioning           Safety/Judgement: Decreased awareness of safety;Decreased awareness of deficits (trying to get out of chair with leg rest up)         General Comments       Exercises       Shoulder Instructions      Home Living Family/patient  expects to be discharged to:: Skilled nursing facility                                        Prior Functioning/Environment Level of Independence: Independent        Comments: Living independently PTA. States he has traveled the world.     OT Diagnosis: Generalized weakness;Cognitive deficits   OT Problem List: Decreased strength;Decreased range of motion;Decreased activity tolerance;Impaired balance (sitting and/or standing);Decreased cognition;Decreased safety awareness;Decreased knowledge of use of DME or AE;Obesity;Pain   OT Treatment/Interventions: Self-care/ADL training;Therapeutic exercise;Energy conservation;DME and/or AE instruction;Therapeutic activities;Cognitive remediation/compensation;Patient/family education;Balance training    OT Goals(Current goals can be found in the care plan section) Acute Rehab OT Goals Patient Stated Goal: get better OT Goal Formulation: With patient Time For Goal Achievement: 07/07/15 Potential to Achieve Goals: Good  OT Frequency: Min 2X/week   Barriers to D/C: Decreased caregiver support          Co-evaluation              End of Session Equipment Utilized During Treatment: Gait belt Nurse Communication: Mobility status  Activity Tolerance: Patient tolerated treatment well Patient left: in bed;with call bell/phone within reach;with bed alarm set   Time: 1655-1716 OT Time Calculation (min): 21 min Charges:  OT General Charges $OT Visit: 1 Procedure OT Evaluation $OT Eval Moderate Complexity: 1 Procedure G-Codes:    Barlow Harrison,HILLARY 06/30/2015, 5:28 PM   [facility], OTR/L  [phone] 06/30/2015

## 2015-06-23 NOTE — Progress Notes (Signed)
Patient ID: David Ford, male   DOB: February 28, 1941, 75 y.o.   MRN: [id number]     [address] [place], [place] [address]    Phone: [phone] FAX: [phone]     Subjective: Recorded BM. Denies flatus.  AXR unchanged.   Objective:  Vital signs:  Filed Vitals:   06/22/15 2355 06/23/15 0259 06/23/15 0300 06/23/15 0733  BP: 150/74  144/72   Pulse: 90  83   Temp:  98.7 F (37.1 C)  98.1 F (36.7 C)  TempSrc:  Oral  Oral  Resp: 15  18   Height:      Weight:      SpO2: 95%  97%     Last BM Date: 06/22/15  Intake/Output   Yesterday:  01/02 0701 - 01/03 0700 In: 90 [NG/GT:40; IV Piggyback:50] Out: 0254 [Urine:50; Emesis/NG output:400] This shift:      Physical Exam: General: Pt awake/alert/oriented x4 in no acute distress  Abdomen: Soft.  distended.   Non tender.   No evidence of peritonitis.  No incarcerated hernias.    Problem List:   Active Problems:   Hypertension   Leukocytosis   Elevated troponin   Hyponatremia   Hypoalbuminemia   Hyperkalemia   Rhabdomyolysis   Metabolic acidosis   Acute renal failure (ARF) (HCC)   DM type 2 causing renal disease ([place])   Dehydration   Debility   Diabetic foot infection (HCC)   Ileus (HCC)   SBO (small bowel obstruction) (HCC)   Gastrointestinal hemorrhage with hematemesis   Atrial fibrillation ([place])    Results:   Labs: Results for orders placed or performed during the hospital encounter of 06/17/15 (from the past 48 hour(s))  Heparin level (unfractionated)     Status: None   Collection Time: 06/21/15 10:27 AM  Result Value Ref Range   Heparin Unfractionated 0.63 0.30 - 0.70 IU/mL    Comment:        IF HEPARIN RESULTS ARE BELOW EXPECTED VALUES, AND PATIENT DOSAGE HAS BEEN CONFIRMED, SUGGEST FOLLOW UP TESTING OF ANTITHROMBIN III LEVELS.   Glucose, capillary     Status: Abnormal   Collection Time: 06/21/15 11:41 AM  Result Value Ref  Range   Glucose-Capillary 149 (H) 65 - 99 mg/dL  Glucose, capillary     Status: Abnormal   Collection Time: 06/21/15  3:46 PM  Result Value Ref Range   Glucose-Capillary 148 (H) 65 - 99 mg/dL  Renal function panel     Status: Abnormal   Collection Time: 06/21/15  5:25 PM  Result Value Ref Range   Sodium 137 135 - 145 mmol/L   Potassium 4.3 3.5 - 5.1 mmol/L   Chloride 97 (L) 101 - 111 mmol/L   CO2 24 22 - 32 mmol/L   Glucose, Bld 133 (H) 65 - 99 mg/dL   BUN 90 (H) 6 - 20 mg/dL   Creatinine, Ser 9.14 (H) 0.61 - 1.24 mg/dL   Calcium 8.2 (L) 8.9 - 10.3 mg/dL   Phosphorus 6.0 (H) 2.5 - 4.6 mg/dL   Albumin 1.9 (L) 3.5 - 5.0 g/dL   GFR calc non Af Amer 5 (L) >60 mL/min   GFR calc Af Amer 6 (L) >60 mL/min    Comment: (NOTE) The eGFR has been calculated using the CKD EPI equation. This calculation has not been validated in all clinical situations. eGFR's persistently <60 mL/min signify possible Chronic Kidney Disease.  Anion gap 16 (H) 5 - 15  Glucose, capillary     Status: Abnormal   Collection Time: 06/21/15  7:13 PM  Result Value Ref Range   Glucose-Capillary 107 (H) 65 - 99 mg/dL  Glucose, capillary     Status: Abnormal   Collection Time: 06/21/15 11:54 PM  Result Value Ref Range   Glucose-Capillary 102 (H) 65 - 99 mg/dL  Glucose, capillary     Status: Abnormal   Collection Time: 06/22/15  3:06 AM  Result Value Ref Range   Glucose-Capillary 105 (H) 65 - 99 mg/dL  Renal function panel     Status: Abnormal   Collection Time: 06/22/15  5:30 AM  Result Value Ref Range   Sodium 137 135 - 145 mmol/L   Potassium 4.3 3.5 - 5.1 mmol/L   Chloride 97 (L) 101 - 111 mmol/L   CO2 22 22 - 32 mmol/L   Glucose, Bld 113 (H) 65 - 99 mg/dL   BUN 103 (H) 6 - 20 mg/dL   Creatinine, Ser 9.94 (H) 0.61 - 1.24 mg/dL   Calcium 8.4 (L) 8.9 - 10.3 mg/dL   Phosphorus 6.7 (H) 2.5 - 4.6 mg/dL   Albumin 1.9 (L) 3.5 - 5.0 g/dL   GFR calc non Af Amer 5 (L) >60 mL/min   GFR calc Af Amer 5 (L) >60  mL/min    Comment: (NOTE) The eGFR has been calculated using the CKD EPI equation. This calculation has not been validated in all clinical situations. eGFR's persistently <60 mL/min signify possible Chronic Kidney Disease.    Anion gap 18 (H) 5 - 15  CBC     Status: Abnormal   Collection Time: 06/22/15  5:30 AM  Result Value Ref Range   WBC 19.0 (H) 4.0 - 10.5 K/uL   RBC 4.08 (L) 4.22 - 5.81 MIL/uL   Hemoglobin 11.2 (L) 13.0 - 17.0 g/dL   HCT 32.8 (L) 39.0 - 52.0 %   MCV 80.4 78.0 - 100.0 fL   MCH 27.5 26.0 - 34.0 pg   MCHC 34.1 30.0 - 36.0 g/dL   RDW 14.1 11.5 - 15.5 %   Platelets 292 150 - 400 K/uL  Heparin level (unfractionated)     Status: None   Collection Time: 06/22/15  5:30 AM  Result Value Ref Range   Heparin Unfractionated 0.50 0.30 - 0.70 IU/mL    Comment:        IF HEPARIN RESULTS ARE BELOW EXPECTED VALUES, AND PATIENT DOSAGE HAS BEEN CONFIRMED, SUGGEST FOLLOW UP TESTING OF ANTITHROMBIN III LEVELS.   Glucose, capillary     Status: Abnormal   Collection Time: 06/22/15  7:34 AM  Result Value Ref Range   Glucose-Capillary 105 (H) 65 - 99 mg/dL   Comment 1 Notify RN    Comment 2 Document in Chart   Glucose, capillary     Status: Abnormal   Collection Time: 06/22/15 11:16 AM  Result Value Ref Range   Glucose-Capillary 132 (H) 65 - 99 mg/dL   Comment 1 Notify RN    Comment 2 Document in Chart   Hepatitis B surface antigen     Status: None   Collection Time: 06/22/15  2:15 PM  Result Value Ref Range   Hepatitis B Surface Ag Negative Negative    Comment: (NOTE) CALLED RIA 06/22/15 @ 12:56PM D LOWERY Performed At: [facility] [address] [place], [place] [id number] Lindon Romp MD [id number]   Glucose, capillary     Status: Abnormal  Collection Time: 06/22/15  6:50 PM  Result Value Ref Range   Glucose-Capillary 131 (H) 65 - 99 mg/dL  Glucose, capillary     Status: Abnormal   Collection Time: 06/22/15 11:53 PM  Result Value Ref Range    Glucose-Capillary 171 (H) 65 - 99 mg/dL  Renal function panel     Status: Abnormal   Collection Time: 06/23/15  4:20 AM  Result Value Ref Range   Sodium 137 135 - 145 mmol/L   Potassium 3.9 3.5 - 5.1 mmol/L   Chloride 97 (L) 101 - 111 mmol/L   CO2 24 22 - 32 mmol/L   Glucose, Bld 159 (H) 65 - 99 mg/dL   BUN 63 (H) 6 - 20 mg/dL   Creatinine, Ser 6.88 (H) 0.61 - 1.24 mg/dL   Calcium 8.4 (L) 8.9 - 10.3 mg/dL   Phosphorus 5.6 (H) 2.5 - 4.6 mg/dL   Albumin 1.8 (L) 3.5 - 5.0 g/dL   GFR calc non Af Amer 7 (L) >60 mL/min   GFR calc Af Amer 8 (L) >60 mL/min    Comment: (NOTE) The eGFR has been calculated using the CKD EPI equation. This calculation has not been validated in all clinical situations. eGFR's persistently <60 mL/min signify possible Chronic Kidney Disease.    Anion gap 16 (H) 5 - 15  CBC     Status: Abnormal   Collection Time: 06/23/15  4:20 AM  Result Value Ref Range   WBC 18.9 (H) 4.0 - 10.5 K/uL   RBC 3.61 (L) 4.22 - 5.81 MIL/uL   Hemoglobin 9.8 (L) 13.0 - 17.0 g/dL   HCT 29.2 (L) 39.0 - 52.0 %   MCV 80.9 78.0 - 100.0 fL   MCH 27.1 26.0 - 34.0 pg   MCHC 33.6 30.0 - 36.0 g/dL   RDW 14.0 11.5 - 15.5 %   Platelets 300 150 - 400 K/uL  Glucose, capillary     Status: Abnormal   Collection Time: 06/23/15  5:33 AM  Result Value Ref Range   Glucose-Capillary 155 (H) 65 - 99 mg/dL  Glucose, capillary     Status: Abnormal   Collection Time: 06/23/15  7:32 AM  Result Value Ref Range   Glucose-Capillary 161 (H) 65 - 99 mg/dL   Comment 1 Notify RN    Comment 2 Document in Chart     Imaging / Studies: Dg Abd Portable 1v  06/23/2015  CLINICAL DATA:  Small bowel obstruction EXAM: PORTABLE ABDOMEN - 1 VIEW COMPARISON:  06/22/2015 FINDINGS: Loops of small bowel are similar to the prior exam and measuring up to 5.8 cm in diameter, previously the same by my measurements. There is gas within nondilated transverse colon. Nasogastric tube remains coiled in the stomach. IMPRESSION: 1.  Similar degree of dilatation of small bowel loops, measuring up to 5.8 cm in diameter. Given the lack of colonic dilatation, appearance favors obstruction over ileus. Electronically Signed   By: Van Clines M.D.   On: 06/23/2015 07:58   Dg Abd Portable 1v  06/22/2015  CLINICAL DATA:  NG tube placement.  Hemoptysis. EXAM: PORTABLE ABDOMEN - 1 VIEW COMPARISON:  Radiographs earlier this day at 0550 hour FINDINGS: Tip and side-port of the enteric tube below the diaphragm, tip directed towards the fundus, unchanged from prior exam. Diffusely dilated small bowel loops in the central abdomen, with equivocal improvement, greatest dimension 5.3 cm, previously 5.9 cm. No evidence of free air. IMPRESSION: Persistent small-bowel obstruction, with equivocal improvement and decrease in degree of  bowel distention. Enteric tube remains in place. Electronically Signed   By: Jeb Levering M.D.   On: 06/22/2015 19:32   Dg Abd Portable 1v  06/22/2015  CLINICAL DATA:  75 year old male with abdominal pain and distention. Small bowel obstruction. EXAM: PORTABLE ABDOMEN - 1 VIEW COMPARISON:  Abdominal radiograph 06/21/2015. FINDINGS: Nasogastric tube coiled in the stomach with tip in the region of the cardia. Gaseous distention throughout the small bowel which is dilated up to 5.9 cm in diameter. Some gas is also noted throughout the colon. No gross evidence of pneumoperitoneum. Calcific density projecting over the left ilium, presumably something ingested. This is move slightly compared to yesterday's examination, and is new compared to prior study 06/20/2015. IMPRESSION: 1. Findings remain compatible with partial small bowel obstruction, as above. 2. No gross evidence of pneumoperitoneum on this single supine view. 3. Nasogastric tube is coiled back upon itself with tip in the cardia of the stomach. Electronically Signed   By: Vinnie Langton M.D.   On: 06/22/2015 08:01    Medications / Allergies:  Scheduled Meds: .  antiseptic oral rinse  7 mL Mouth Rinse q12n4p  . brimonidine  1 drop Both Eyes BID  . chlorhexidine  15 mL Mouth Rinse BID  . insulin aspart  0-9 Units Subcutaneous 4 times per day  . insulin glargine  10 Units Subcutaneous Daily  . latanoprost  1 drop Both Eyes QHS  . pantoprazole (PROTONIX) IV  40 mg Intravenous Q12H  . piperacillin-tazobactam (ZOSYN)  IV  2.25 g Intravenous Q8H  . sodium chloride  3 mL Intravenous Q12H   Continuous Infusions:  PRN Meds:.dextrose, diphenhydrAMINE, HYDROmorphone (DILAUDID) injection, metoprolol, ondansetron (ZOFRAN) IV  Antibiotics: Anti-infectives    Start     Dose/Rate Route Frequency Ordered Stop   06/23/15 0100  piperacillin-tazobactam (ZOSYN) IVPB 2.25 g     2.25 g 100 mL/hr over 30 Minutes Intravenous Every 8 hours 06/22/15 1912     06/22/15 1600  vancomycin (VANCOCIN) IVPB 1000 mg/200 mL premix     1,000 mg 200 mL/hr over 60 Minutes Intravenous  Once 06/22/15 1448 06/22/15 1730   06/20/15 1400  vancomycin (VANCOCIN) IVPB 1000 mg/200 mL premix     1,000 mg 200 mL/hr over 60 Minutes Intravenous  Once 06/20/15 1205 06/20/15 1538   06/19/15 2100  vancomycin (VANCOCIN) IVPB 1000 mg/200 mL premix  Status:  Discontinued     1,000 mg 200 mL/hr over 60 Minutes Intravenous Every 48 hours 06/17/15 2214 06/18/15 0945   06/18/15 1200  vancomycin (VANCOCIN) IVPB 1000 mg/200 mL premix  Status:  Discontinued     1,000 mg 200 mL/hr over 60 Minutes Intravenous Every 48 hours 06/18/15 0945 06/20/15 1204   06/18/15 0530  piperacillin-tazobactam (ZOSYN) IVPB 2.25 g  Status:  Discontinued     2.25 g 100 mL/hr over 30 Minutes Intravenous Every 8 hours 06/17/15 2214 06/22/15 1912   06/17/15 2115  piperacillin-tazobactam (ZOSYN) IVPB 3.375 g     3.375 g 100 mL/hr over 30 Minutes Intravenous  Once 06/17/15 2111 06/17/15 2151   06/17/15 2115  vancomycin (VANCOCIN) IVPB 1000 mg/200 mL premix     1,000 mg 200 mL/hr over 60 Minutes Intravenous  Once 06/17/15 2111  06/18/15 0235        Assessment/Plan pSBO-AXR unchanged, had a BM, non tender.  Will consider clamping trial.   Erby Pian, ANP-BC [facility] Pager [phone](7A-4:30P) For consults and floor pages call [phone](7A-4:30P)  06/23/2015 8:07 AM

## 2015-06-24 DIAGNOSIS — R5381 Other malaise: Secondary | ICD-10-CM

## 2015-06-24 DIAGNOSIS — E8809 Other disorders of plasma-protein metabolism, not elsewhere classified: Secondary | ICD-10-CM

## 2015-06-24 DIAGNOSIS — I10 Essential (primary) hypertension: Secondary | ICD-10-CM

## 2015-06-24 LAB — RENAL FUNCTION PANEL
ALBUMIN: 1.8 g/dL — AB (ref 3.5–5.0)
Anion gap: 20 — ABNORMAL HIGH (ref 5–15)
BUN: 87 mg/dL — AB (ref 6–20)
CHLORIDE: 100 mmol/L — AB (ref 101–111)
CO2: 20 mmol/L — ABNORMAL LOW (ref 22–32)
CREATININE: 9.59 mg/dL — AB (ref 0.61–1.24)
Calcium: 8.6 mg/dL — ABNORMAL LOW (ref 8.9–10.3)
GFR calc Af Amer: 5 mL/min — ABNORMAL LOW (ref >60)
GFR, EST NON AFRICAN AMERICAN: 5 mL/min — AB (ref >60)
Glucose, Bld: 165 mg/dL — ABNORMAL HIGH (ref 65–99)
Phosphorus: 7.8 mg/dL — ABNORMAL HIGH (ref 2.5–4.6)
Potassium: 4.3 mmol/L (ref 3.5–5.1)
Sodium: 140 mmol/L (ref 135–145)

## 2015-06-24 LAB — GLUCOSE, CAPILLARY
GLUCOSE-CAPILLARY: 158 mg/dL — AB (ref 65–99)
Glucose-Capillary: 161 mg/dL — ABNORMAL HIGH (ref 65–99)
Glucose-Capillary: 203 mg/dL — ABNORMAL HIGH (ref 65–99)

## 2015-06-24 LAB — CBC
HCT: 31.6 % — ABNORMAL LOW (ref 39.0–52.0)
Hemoglobin: 10.6 g/dL — ABNORMAL LOW (ref 13.0–17.0)
MCH: 27.5 pg (ref 26.0–34.0)
MCHC: 33.5 g/dL (ref 30.0–36.0)
MCV: 82.1 fL (ref 78.0–100.0)
PLATELETS: 312 10*3/uL (ref 150–400)
RBC: 3.85 MIL/uL — ABNORMAL LOW (ref 4.22–5.81)
RDW: 14.2 % (ref 11.5–15.5)
WBC: 16.8 10*3/uL — AB (ref 4.0–10.5)

## 2015-06-24 MED ORDER — GLUCERNA SHAKE PO LIQD
237.0000 mL | Freq: Three times a day (TID) | ORAL | Status: DC
  Administered 2015-06-24: 237 mL via ORAL

## 2015-06-24 NOTE — Progress Notes (Signed)
Patient ID: David Ford, male   DOB: [birth year], 75 y.o.   MRN: [id number]     [address] [place], [place] [address]    Phone: [phone] FAX: [phone]     Subjective: 5 BMs yesterday and 1 this AM. No abdominal pain, n/v.   Objective:  Vital signs:  Filed Vitals:   06/23/15 2351 06/24/15 0000 06/24/15 0400 06/24/15 0415  BP:  122/83 143/76   Pulse:  83 78   Temp: 97.7 F (36.5 C)   98 F (36.7 C)  TempSrc: Oral   Oral  Resp:  15 18   Height:      Weight:      SpO2:  94% 95%     Last BM Date: 06/23/15  Intake/Output   Yesterday:  01/03 0701 - 01/04 0700 In: -  Out: 800 [Urine:40; Emesis/NG output:760] This shift:    I/O last 3 completed shifts: In: 34 [IV Piggyback:50] Out: 17 [Urine:90; Emesis/NG output:960]   Physical Exam: General: Pt awake/alert/oriented x4 in no acute distress  Abdomen: Soft.  Nondistended. Non tender.  No evidence of peritonitis.  No incarcerated hernias.    Problem List:   Active Problems:   Hypertension   Leukocytosis   Elevated troponin   Hyponatremia   Hypoalbuminemia   Hyperkalemia   Rhabdomyolysis   Metabolic acidosis   Acute renal failure (ARF) (HCC)   DM type 2 causing renal disease ([place])   Dehydration   Debility   Diabetic foot infection (HCC)   Ileus (HCC)   SBO (small bowel obstruction) (HCC)   Gastrointestinal hemorrhage with hematemesis   Atrial fibrillation ([place])    Results:   Labs: Results for orders placed or performed during the hospital encounter of 06/17/15 (from the past 48 hour(s))  Glucose, capillary     Status: Abnormal   Collection Time: 06/22/15  7:34 AM  Result Value Ref Range   Glucose-Capillary 105 (H) 65 - 99 mg/dL   Comment 1 Notify RN    Comment 2 Document in Chart   Glucose, capillary     Status: Abnormal   Collection Time: 06/22/15 11:16 AM  Result Value Ref Range   Glucose-Capillary 132 (H) 65 - 99  mg/dL   Comment 1 Notify RN    Comment 2 Document in Chart   Hepatitis B surface antigen     Status: None   Collection Time: 06/22/15  2:15 PM  Result Value Ref Range   Hepatitis B Surface Ag Negative Negative    Comment: (NOTE) CALLED RIA 06/22/15 @ 12:56PM D LOW[facility] Performed At: [facility] [address] [place], [place] [id number] Lindon Romp MD DU:[id number]   Glucose, capillary     Status: Abnormal   Collection Time: 06/22/15  6:50 PM  Result Value Ref Range   Glucose-Capillary 131 (H) 65 - 99 mg/dL  Glucose, capillary     Status: Abnormal   Collection Time: 06/22/15 11:53 PM  Result Value Ref Range   Glucose-Capillary 171 (H) 65 - 99 mg/dL  Renal function panel     Status: Abnormal   Collection Time: 06/23/15  4:20 AM  Result Value Ref Range   Sodium 137 135 - 145 mmol/L   Potassium 3.9 3.5 - 5.1 mmol/L   Chloride 97 (L) 101 - 111 mmol/L   CO2 24 22 - 32 mmol/L   Glucose, Bld 159 (H) 65 - 99 mg/dL   BUN  63 (H) 6 - 20 mg/dL   Creatinine, Ser 6.88 (H) 0.61 - 1.24 mg/dL   Calcium 8.4 (L) 8.9 - 10.3 mg/dL   Phosphorus 5.6 (H) 2.5 - 4.6 mg/dL   Albumin 1.8 (L) 3.5 - 5.0 g/dL   GFR calc non Af Amer 7 (L) >60 mL/min   GFR calc Af Amer 8 (L) >60 mL/min    Comment: (NOTE) The eGFR has been calculated using the CKD EPI equation. This calculation has not been validated in all clinical situations. eGFR's persistently <60 mL/min signify possible Chronic Kidney Disease.    Anion gap 16 (H) 5 - 15  CBC     Status: Abnormal   Collection Time: 06/23/15  4:20 AM  Result Value Ref Range   WBC 18.9 (H) 4.0 - 10.5 K/uL   RBC 3.61 (L) 4.22 - 5.81 MIL/uL   Hemoglobin 9.8 (L) 13.0 - 17.0 g/dL   HCT 29.2 (L) 39.0 - 52.0 %   MCV 80.9 78.0 - 100.0 fL   MCH 27.1 26.0 - 34.0 pg   MCHC 33.6 30.0 - 36.0 g/dL   RDW 14.0 11.5 - 15.5 %   Platelets 300 150 - 400 K/uL  Glucose, capillary     Status: Abnormal   Collection Time: 06/23/15  5:33 AM  Result Value Ref Range    Glucose-Capillary 155 (H) 65 - 99 mg/dL  Glucose, capillary     Status: Abnormal   Collection Time: 06/23/15  7:32 AM  Result Value Ref Range   Glucose-Capillary 161 (H) 65 - 99 mg/dL   Comment 1 Notify RN    Comment 2 Document in Chart   Glucose, capillary     Status: Abnormal   Collection Time: 06/23/15 11:35 AM  Result Value Ref Range   Glucose-Capillary 137 (H) 65 - 99 mg/dL   Comment 1 Notify RN    Comment 2 Document in Chart   Glucose, capillary     Status: Abnormal   Collection Time: 06/23/15  6:48 PM  Result Value Ref Range   Glucose-Capillary 171 (H) 65 - 99 mg/dL   Comment 1 Notify RN    Comment 2 Document in Chart   Glucose, capillary     Status: Abnormal   Collection Time: 06/23/15 11:54 PM  Result Value Ref Range   Glucose-Capillary 139 (H) 65 - 99 mg/dL  Glucose, capillary     Status: Abnormal   Collection Time: 06/24/15  5:23 AM  Result Value Ref Range   Glucose-Capillary 158 (H) 65 - 99 mg/dL  Renal function panel     Status: Abnormal   Collection Time: 06/24/15  5:30 AM  Result Value Ref Range   Sodium 140 135 - 145 mmol/L   Potassium 4.3 3.5 - 5.1 mmol/L   Chloride 100 (L) 101 - 111 mmol/L   CO2 20 (L) 22 - 32 mmol/L   Glucose, Bld 165 (H) 65 - 99 mg/dL   BUN 87 (H) 6 - 20 mg/dL   Creatinine, Ser 9.59 (H) 0.61 - 1.24 mg/dL   Calcium 8.6 (L) 8.9 - 10.3 mg/dL   Phosphorus 7.8 (H) 2.5 - 4.6 mg/dL   Albumin 1.8 (L) 3.5 - 5.0 g/dL   GFR calc non Af Amer 5 (L) >60 mL/min   GFR calc Af Amer 5 (L) >60 mL/min    Comment: (NOTE) The eGFR has been calculated using the CKD EPI equation. This calculation has not been validated in all clinical situations. eGFR's persistently <60 mL/min signify possible  Chronic Kidney Disease.    Anion gap 20 (H) 5 - 15  CBC     Status: Abnormal   Collection Time: 06/24/15  5:30 AM  Result Value Ref Range   WBC 16.8 (H) 4.0 - 10.5 K/uL   RBC 3.85 (L) 4.22 - 5.81 MIL/uL   Hemoglobin 10.6 (L) 13.0 - 17.0 g/dL   HCT 31.6 (L)  39.0 - 52.0 %   MCV 82.1 78.0 - 100.0 fL   MCH 27.5 26.0 - 34.0 pg   MCHC 33.5 30.0 - 36.0 g/dL   RDW 14.2 11.5 - 15.5 %   Platelets 312 150 - 400 K/uL    Imaging / Studies: Dg Abd Portable 1v-small Bowel Obstruction Protocol-initial, 8 Hr Delay  06/23/2015  CLINICAL DATA:  75 year old male with a history of small bowel obstruction. EXAM: PORTABLE ABDOMEN - 1 VIEW COMPARISON:  Multiple prior plain film, most recent 06/23/2015, 06/22/2015, 06/21/2015, 06/20/2015, CT 06/19/2015 FINDINGS: Gastric tube projects over the lower mediastinum and terminates in the region of the stomach, unchanged. There has been progression of enteric contrast to the distal small bowel and colon. The opacified terminal ileum and length of the colon a decompressed. Small bowel loops within the mid and left abdomen are persistently borderline dilated, although the diameter is not as exaggerated as the most recent comparison plain film. IMPRESSION: Compared to prior plain films there has been progression of enteric contrast to the terminal ileum and throughout the length of the colon, with decreasing diameter of borderline dilated small bowel loops in the mid left abdomen. Findings suggest resolving partial bowel obstruction. Unchanged gastric tube. Signed, Dulcy Fanny. Earleen Newport, DO [facility] Radiology Electronically Signed   By: Corrie Mckusick D.O.   On: 06/23/2015 20:40   Dg Abd Portable 1v  06/23/2015  CLINICAL DATA:  Small bowel obstruction EXAM: PORTABLE ABDOMEN - 1 VIEW COMPARISON:  06/22/2015 FINDINGS: Loops of small bowel are similar to the prior exam and measuring up to 5.8 cm in diameter, previously the same by my measurements. There is gas within nondilated transverse colon. Nasogastric tube remains coiled in the stomach. IMPRESSION: 1. Similar degree of dilatation of small bowel loops, measuring up to 5.8 cm in diameter. Given the lack of colonic dilatation, appearance favors  obstruction over ileus. Electronically Signed   By: Van Clines M.D.   On: 06/23/2015 07:58   Dg Abd Portable 1v  06/22/2015  CLINICAL DATA:  NG tube placement.  Hemoptysis. EXAM: PORTABLE ABDOMEN - 1 VIEW COMPARISON:  Radiographs earlier this day at 0550 hour FINDINGS: Tip and side-port of the enteric tube below the diaphragm, tip directed towards the fundus, unchanged from prior exam. Diffusely dilated small bowel loops in the central abdomen, with equivocal improvement, greatest dimension 5.3 cm, previously 5.9 cm. No evidence of free air. IMPRESSION: Persistent small-bowel obstruction, with equivocal improvement and decrease in degree of bowel distention. Enteric tube remains in place. Electronically Signed   By: Jeb Levering M.D.   On: 06/22/2015 19:32    Medications / Allergies:  Scheduled Meds: . antiseptic oral rinse  7 mL Mouth Rinse q12n4p  . brimonidine  1 drop Both Eyes BID  . chlorhexidine  15 mL Mouth Rinse BID  . GLUCERNA  237 mL Oral TID BM  . heparin subcutaneous  5,000 Units Subcutaneous 3 times per day  . insulin aspart  0-9 Units Subcutaneous 4 times per day  . insulin glargine  10 Units Subcutaneous Daily  .  latanoprost  1 drop Both Eyes QHS  . pantoprazole (PROTONIX) IV  40 mg Intravenous Q12H  . piperacillin-tazobactam (ZOSYN)  IV  2.25 g Intravenous Q8H  . sodium chloride  3 mL Intravenous Q12H   Continuous Infusions:  PRN Meds:.dextrose, diphenhydrAMINE, HYDROmorphone (DILAUDID) injection, metoprolol, ondansetron (ZOFRAN) IV  Antibiotics: Anti-infectives    Start     Dose/Rate Route Frequency Ordered Stop   06/23/15 0100  piperacillin-tazobactam (ZOSYN) IVPB 2.25 g     2.25 g 100 mL/hr over 30 Minutes Intravenous Every 8 hours 06/22/15 1912     06/22/15 1600  vancomycin (VANCOCIN) IVPB 1000 mg/200 mL premix     1,000 mg 200 mL/hr over 60 Minutes Intravenous  Once 06/22/15 1448 06/22/15 1730   06/20/15 1400  vancomycin (VANCOCIN) IVPB 1000 mg/200 mL  premix     1,000 mg 200 mL/hr over 60 Minutes Intravenous  Once 06/20/15 1205 06/20/15 1538   06/19/15 2100  vancomycin (VANCOCIN) IVPB 1000 mg/200 mL premix  Status:  Discontinued     1,000 mg 200 mL/hr over 60 Minutes Intravenous Every 48 hours 06/17/15 2214 06/18/15 0945   06/18/15 1200  vancomycin (VANCOCIN) IVPB 1000 mg/200 mL premix  Status:  Discontinued     1,000 mg 200 mL/hr over 60 Minutes Intravenous Every 48 hours 06/18/15 0945 06/20/15 1204   06/18/15 0530  piperacillin-tazobactam (ZOSYN) IVPB 2.25 g  Status:  Discontinued     2.25 g 100 mL/hr over 30 Minutes Intravenous Every 8 hours 06/17/15 2214 06/22/15 1912   06/17/15 2115  piperacillin-tazobactam (ZOSYN) IVPB 3.375 g     3.375 g 100 mL/hr over 30 Minutes Intravenous  Once 06/17/15 2111 06/17/15 2151   06/17/15 2115  vancomycin (VANCOCIN) IVPB 1000 mg/200 mL premix     1,000 mg 200 mL/hr over 60 Minutes Intravenous  Once 06/17/15 2111 06/18/15 0235        Assessment/Plan PSBO-resolving, clamp NGT and allow for clear liquids +shakes.  If able to tolerate, DC NGT later today and advance diet slowly.  Erby Pian, [facility] Surgery Pager [phone]) For consults and floor pages call [phone]([id number]:30P)   06/24/2015 7:30 AM

## 2015-06-24 NOTE — Progress Notes (Signed)
Nasogastric tube discontinued per order. Patient vomited red substance and had bowel movement upon removal of NGT.  Patient reports that he doesn't feel nauseous and that he is feeling better.

## 2015-06-24 NOTE — Progress Notes (Signed)
Patient noted to having some hematemesis approx. 50-75 ml of bright red blood with clots. MD notified due to having SQ Heparin due.Order to hold 2200 dose and observe patient for further bleeding.

## 2015-06-24 NOTE — Progress Notes (Signed)
[facility] Rounding Note  Subjective:   Awake, alert, in good spirits Seen in HD - tolerating well UOP 50 cc/24 hours  Physical Exam:  BP 132/82 mmHg  Pulse 77  Temp(Src) 97.1 F (36.2 C) (Oral)  Resp 17  Ht 6\' 2"  (1.88 m)  Wt 95.5 kg (210 lb 8.6 oz)  BMI 27.02 kg/m2  SpO2 100%  GEN: NAD, elderly male lying in bed. Very pleasant.   ENT: NGTube clamped. (Eating ice chips) CV: Irreg rate 70's No rub PULM: Ant clear. ABD: Mod abd distension.  EXT:No edema. Foot not examined (wrapped). R IJ Temp HD cath  (12/30) running fine in HD  Scheduled Meds: . antiseptic oral rinse  7 mL Mouth Rinse q12n4p  . brimonidine  1 drop Both Eyes BID  . chlorhexidine  15 mL Mouth Rinse BID  . feeding supplement (GLUCERNA SHAKE)  237 mL Oral TID BM  . heparin subcutaneous  5,000 Units Subcutaneous 3 times per day  . insulin aspart  0-9 Units Subcutaneous 4 times per day  . insulin glargine  10 Units Subcutaneous Daily  . latanoprost  1 drop Both Eyes QHS  . pantoprazole (PROTONIX) IV  40 mg Intravenous Q12H  . piperacillin-tazobactam (ZOSYN)  IV  2.25 g Intravenous Q8H  . sodium chloride  3 mL Intravenous Q12H       PRN Meds:.dextrose, diphenhydrAMINE, HYDROmorphone (DILAUDID) injection, metoprolol, ondansetron (ZOFRAN) IV    Recent Labs Lab 06/22/15 0530 06/23/15 0420 06/24/15 0530  NA 137 137 140  K 4.3 3.9 4.3  CL 97* 97* 100*  CO2 22 24 20*  GLUCOSE 113* 159* 165*  BUN 103* 63* 87*  CREATININE 9.94* 6.88* 9.59*  CALCIUM 8.4* 8.4* 8.6*  PHOS 6.7* 5.6* 7.8*    Recent Labs Lab 06/22/15 0530 06/23/15 0420 06/24/15 0530  WBC 19.0* 18.9* 16.8*  HGB 11.2* 9.8* 10.6*  HCT 32.8* 29.2* 31.6*  MCV 80.4 80.9 82.1  PLT 292 300 312    Background: 45M with renal insufficiency, unclear duration (creatinine 2.0 04/2015), mild hyperkalemia, admitted after down on ground for 3d with creatinine of 4; on ACEi; with mild rhabdomyolysis; septic with diabetic foot  ulcer/gangrene. Hospital course complicated by AF/RVR, GIB. Progressive AKI, oligoanuric. HD initiated 12/31 (R IJ tem cath 12/30)  ASSESSMENT/RECOMMENDATIONS  1. Anuric AKI on CKD3 (SCr 04/2015 2.0; Probable baseline diabetic nephropathy with baseline CKD3) with subnephrotic proteinuria; not obstructed.  Rhabdo/gangrenous foot w/sepsis/vol depletion/ACI-->all led to ischemic ATN.  s/p R IJ Temp HD cath 06/19/15 and started HD 12/31. Still negligible UOP. Getting HD today. D/C foley 2. Diabetic foot ulcer/gangrene of left 1st 2 toes - deferred until more stable per Dr. Sharol Given 3. Sepsis d/t #2. Vanco and zosyn 4. Partial SBO with partial small bowel malrotation.  NGT. Having BM's now. Clamping NG and allowing sips. Surgeons thinking bowel issue possibly d/t ischemia. Hoping to avoid surgery. 5. UGIB with NGT in place. Felt gastritis/asa/tube trauma/uremia. Hb drop 11.3->9.8 yesterday with some more bloody NG drainage yesterday. Heparin off. On IV protonix. Up to 10.6 today. 6. Leukocytosis - persistent but improving 7. Mild rhabdomyolysis at admission - resolved 8. DM2 9. HTN on ACEi (now held) 10. AFib with RVR - occurred with HD 12/31. BB for rate control (IV prn). 11. + trops. ? Demand ischemia. EF 50-55% 12. Dehydration, now volume replete 13. Debility / Loss of independence    David Maes, MD [place] Pager 06/24/2015, 1:16 PM

## 2015-06-24 NOTE — Progress Notes (Signed)
Utilization review complete. Grigor Lipschutz RN CCM Case Mgmt phone [phone] 

## 2015-06-24 NOTE — Progress Notes (Signed)
Physical Therapy Treatment Patient Details Name: David Ford MRN: [id number] DOB: 10-10-1940 Today's Date: 06/24/2015    History of Present Illness 75 yo M with hx of DM, HTN Here with generalized weakness was found to have ARF most likely due to dehydration in the setting of ACEi and was found to have untreated diabetic foot ulcer. rhabdomyolysis . ARF. SBO. Awaiting ray amputation of L foot when medically stable.     PT Comments    David Ford is progressing modestly but continues to require assist w/ mobility.  Pt will benefit from continued skilled PT services to increase functional independence and safety.  Follow Up Recommendations  SNF;Supervision/Assistance - 24 hour     Equipment Recommendations  Rolling walker with 5" wheels    Recommendations for Other Services       Precautions / Restrictions Precautions Precautions: Fall Precaution Comments: pt with necrotic L great, 1st and second toe however not medically stable for surgery.  Restrictions Weight Bearing Restrictions: No    Mobility  Bed Mobility Overal bed mobility: Needs Assistance Bed Mobility: Sit to Supine       Sit to supine: Min guard   General bed mobility comments: Increased time, pt fatigues w/ bed mobility  Transfers Overall transfer level: Needs assistance Equipment used: Rolling walker (2 wheeled) Transfers: Sit to/from Stand Sit to Stand: Min guard         General transfer comment: Slow to stand and requires cues for proper hand placement.  Min instability noted upon standing.  Ambulation/Gait Ambulation/Gait assistance: Min guard Ambulation Distance (Feet): 60 Feet Assistive device: Rolling walker (2 wheeled) Gait Pattern/deviations: Step-through pattern;Antalgic;Trunk flexed;Drifts right/left;Decreased stride length   Gait velocity interpretation: Below normal speed for age/gender General Gait Details: Drifting Lt and pt easily fatigues.  Min instability noted, min guard provided  for pt's safety.  Cues to stand upright and look ahead while ambulating.   Stairs            Wheelchair Mobility    Modified Rankin (Stroke Patients Only)       Balance Overall balance assessment: Needs assistance Sitting-balance support: Feet supported;Bilateral upper extremity supported Sitting balance-Leahy Scale: Fair     Standing balance support: Bilateral upper extremity supported;During functional activity Standing balance-Leahy Scale: Poor Standing balance comment: Relies on RW for support                    Cognition Arousal/Alertness: Awake/alert Behavior During Therapy: Flat affect Overall Cognitive Status: Within Functional Limits for tasks assessed                      Exercises General Exercises - Lower Extremity Ankle Circles/Pumps: AROM;Both;10 reps;Seated Long Arc Quad: AROM;Both;10 reps;Seated    General Comments        Pertinent Vitals/Pain Pain Assessment: 0-10 Pain Score: 10-Worst pain ever Pain Location: Rt shoulder and hip Pain Descriptors / Indicators: Aching Pain Intervention(s): Limited activity within patient's tolerance;Monitored during session;Repositioned    Home Living                      Prior Function            PT Goals (current goals can now be found in the care plan section) Acute Rehab PT Goals Patient Stated Goal: get better Progress towards PT goals: Progressing toward goals (modestly)    Frequency  Min 3X/week    PT Plan Current plan remains appropriate    Co-evaluation  End of Session Equipment Utilized During Treatment: Gait belt Activity Tolerance: Patient limited by fatigue Patient left: in bed;with call bell/phone within reach;with SCD's reapplied     Time: 0922-0949 PT Time Calculation (min) (ACUTE ONLY): 27 min  Charges:  $Gait Training: 8-22 mins $Therapeutic Exercise: 8-22 mins                    G Codes:      David Ford PT, [place]  [id number] Pager: [phone] 06/24/2015, 11:04 AM

## 2015-06-24 NOTE — Procedures (Signed)
I have personally attended this patient's dialysis session.   Keeping volume even. 2K bath. 300/600 No heparin R IJ temp cath  Jamal Maes, MD [place] Pager 06/24/2015, 1:25 PM

## 2015-06-24 NOTE — Progress Notes (Signed)
[facility] Progress Note  Patient: David Ford [id number]   PCP: No primary care provider on file. DOB: [birth year]   DOA: 06/17/2015   DOS: 06/24/2015   Date of Service: the patient was seen and examined on 06/24/2015  Subjective: Patient denies any nausea or vomiting. Has been passing minimal amount of gas. Had a bowel movement yesterday. No abdominal pain. Nutrition: Was nothing by mouth yesterday Activity: Out of bed to chair Last BM: 06/23/2015  Assessment and Plan: 1. Diabetic foot infection ([place]) Sepsis, most likely secondary to cellulitis with diabetes. No blood culture on admission, MRSA screen negative, discontinue vancomycin, will also discuss with orthopedic to reconsider amputation.  2. Small bowel obstruction partial. General surgery consulted, currently diet is advance to clear liquid diet and NG tube is being clamped. If cannot advance diet today patient will need TPN.  3. Upper GI bleed. No further episode of bleeding. GI consulted initially and thought to be secondary to trauma from NG tube insertion. H&H remains stable continue monitoring.  4. Anuric acute on chronic kidney disease stage III. Possible diabetic nephropathy. Nephrology following with the patient. Currently has a Foley catheter will consider removing after discussion with nephrology. Patient will go for hemodialysis today again. Appreciate input.  5. Elevated troponin. Echocardiogram 50-55% EF, no wall motion abnormality, grade 1 diastolic dysfunction. As per earlier discussion with cardiology patient will need outpatient workup. It was thought most likely secondary to renal failure as well as demand ischemia.  6. Atrial fibrillation. [place] Vasc score elevated. Will anticoagulation. Will discuss resuming it tomorrow.  7. Bilateral shoulder pain. X-ray shoulder no fracture. May need MRI as an outpatient.  8. Essential hypertension. Will resume oral medication once patient is able to  tolerate orally.  DVT Prophylaxis: subcutaneous Heparin Nutrition: Clear liquid diet Advance goals of care discussion: Full code  Brief Summary of Hospitalization:  HPI: As per the H and P dictated on admission, "David Ford is a 75 y.o. male   has a past medical history of Diabetes mellitus without complication ([place]) and Hypertension.   Presented with a fall onto the floor from the couch. He has communicated that has been down on the floor for past 3 days. He was not able to walk but could crawl and states could reach fridge and microwave so he ate and drank. He reports he was able able to call on the phone the entire time but did not request any help until today when he realized he was not getting any better. He has been living in [place] for past 20 years but has a PCP in [place]. He reports flying there every 3 months.  Patient lives at home alone he was found today by the friend who called EMS. Patient has history of diabetes but his glucose meter has been broken. Patient has been fatigued. Denies any loss of consciousness patient reports some file order from his toes. Reports generalized muscle aches. Reports loss of strength in arms and legs. Reports he was able to urinate today.  He is unsure if he has any history of kidney disease. Unsure if he's been taking his medications Emergency department he was found to have evidence of rhabdomyolysis with CKs 3053, his initial lab work showing creatinine of 4 potassium was 6.0 and an gap was 16. Patient was unable to produce urine. Troponin slightly elevated to 0.05 albumin 2.9 White blood cell, was noted to be elevated 16.2 Hospitalist was called for admission for dehydration rhabdomyolysis evidence of acute  renal failure" Procedures: Echocardiogram 06/18/2015 50-55% ejection fraction. Hemodialysis catheter insertion 06/19/2015 Consultants: Nephrology, gastroenterology, general surgery, orthopedic surgery Dr. Sharol Given, critical care for  dialysis catheter placement. Antibiotics: Anti-infectives    Start     Dose/Rate Route Frequency Ordered Stop   06/23/15 0100  piperacillin-tazobactam (ZOSYN) IVPB 2.25 g     2.25 g 100 mL/hr over 30 Minutes Intravenous Every 8 hours 06/22/15 1912     06/22/15 1600  vancomycin (VANCOCIN) IVPB 1000 mg/200 mL premix     1,000 mg 200 mL/hr over 60 Minutes Intravenous  Once 06/22/15 1448 06/22/15 1730   06/20/15 1400  vancomycin (VANCOCIN) IVPB 1000 mg/200 mL premix     1,000 mg 200 mL/hr over 60 Minutes Intravenous  Once 06/20/15 1205 06/20/15 1538   06/19/15 2100  vancomycin (VANCOCIN) IVPB 1000 mg/200 mL premix  Status:  Discontinued     1,000 mg 200 mL/hr over 60 Minutes Intravenous Every 48 hours 06/17/15 2214 06/18/15 0945   06/18/15 1200  vancomycin (VANCOCIN) IVPB 1000 mg/200 mL premix  Status:  Discontinued     1,000 mg 200 mL/hr over 60 Minutes Intravenous Every 48 hours 06/18/15 0945 06/20/15 1204   06/18/15 0530  piperacillin-tazobactam (ZOSYN) IVPB 2.25 g  Status:  Discontinued     2.25 g 100 mL/hr over 30 Minutes Intravenous Every 8 hours 06/17/15 2214 06/22/15 1912   06/17/15 2115  piperacillin-tazobactam (ZOSYN) IVPB 3.375 g     3.375 g 100 mL/hr over 30 Minutes Intravenous  Once 06/17/15 2111 06/17/15 2151   06/17/15 2115  vancomycin (VANCOCIN) IVPB 1000 mg/200 mL premix     1,000 mg 200 mL/hr over 60 Minutes Intravenous  Once 06/17/15 2111 06/18/15 0235       Family Communication: no family was present at bedside, at the time of interview.   Disposition:  Barriers to safe discharge: Improvement in renal function,   Intake/Output Summary (Last 24 hours) at 06/24/15 1252 Last data filed at 06/24/15 0600  Gross per 24 hour  Intake      0 ml  Output    190 ml  Net   -190 ml   Filed Weights   06/22/15 1354 06/22/15 1745 06/24/15 1050  Weight: 95.4 kg (210 lb 5.1 oz) 94.4 kg (208 lb 1.8 oz) 95.5 kg (210 lb 8.6 oz)    Objective: Physical Exam: Filed Vitals:     06/24/15 1100 06/24/15 1130 06/24/15 1200 06/24/15 1230  BP: 139/73 135/77 153/83 137/81  Pulse: 71 78 67 84  Temp:      TempSrc:      Resp:      Height:      Weight:      SpO2:         General: Appear in mild distress, no Rash; Oral Mucosa moist. Cardiovascular: S1 and S2 Present, no Murmur,  Respiratory: Bilateral Air entry present and Clear to Auscultation, no Crackles, no wheezes Abdomen: Bowel Sound present, Soft and no tenderness Extremities: no Pedal edema, no calf tenderness Neurology: Grossly no focal neuro deficit.  Data Reviewed: CBC:  Recent Labs Lab 06/20/15 0425 06/20/15 1257 06/21/15 0203 06/22/15 0530 06/23/15 0420 06/24/15 0530  WBC 19.0*  --  20.2* 19.0* 18.9* 16.8*  HGB 10.9* 11.3* 11.2* 11.2* 9.8* 10.6*  HCT 31.3* 33.1* 32.3* 32.8* 29.2* 31.6*  MCV 79.8  --  80.8 80.4 80.9 82.1  PLT 222  --  273 292 300 [id number]   Basic Metabolic Panel:  Recent Labs Lab 06/17/15 2235  06/18/15 0408  06/21/15 0201 06/21/15 1725 06/22/15 0530 06/23/15 0420 06/24/15 0530  NA 133*  < > 133*  < > 136 137 137 137 140  K 6.3*  < > 5.0  < > 4.6 4.3 4.3 3.9 4.3  CL 95*  < > 98*  < > 95* 97* 97* 97* 100*  CO2 23  < > 23  < > 23 24 22 24  20*  GLUCOSE 332*  < > 179*  < > 180* 133* 113* 159* 165*  BUN 64*  < > 66*  < > 73* 90* 103* 63* 87*  CREATININE 4.18*  < > 4.75*  < > 7.49* 9.14* 9.94* 6.88* 9.59*  CALCIUM 9.1  < > 8.9  < > 8.1* 8.2* 8.4* 8.4* 8.6*  MG 2.2  --  2.2  --   --   --   --   --   --   PHOS 6.0*  --  5.7*  < > 6.7* 6.0* 6.7* 5.6* 7.8*  < > = values in this interval not displayed. Liver Function Tests:  Recent Labs Lab 06/17/15 1708 06/18/15 0408  06/19/15 1430  06/21/15 0201 06/21/15 1725 06/22/15 0530 06/23/15 0420 06/24/15 0530  AST 73* 68*  --  69*  --   --   --   --   --   --   ALT 29 28  --  45  --   --   --   --   --   --   ALKPHOS 91 91  --  83  --   --   --   --   --   --   BILITOT 0.6 0.5  --  1.0  --   --   --   --   --   --   PROT  6.6 5.8*  --  5.7*  --   --   --   --   --   --   ALBUMIN 2.9* 2.6*  < > 2.1*  < > 1.8* 1.9* 1.9* 1.8* 1.8*  < > = values in this interval not displayed. No results for input(s): LIPASE, AMYLASE in the last 168 hours.  Recent Labs Lab 06/20/15 0421  AMMONIA 21    Cardiac Enzymes:  Recent Labs Lab 06/17/15 2235 06/18/15 0408  06/18/15 1258 06/18/15 1815 06/19/15 0617 06/19/15 1430 06/19/15 2322 06/20/15 0423 06/20/15 1730 06/21/15 0201  CKTOTAL 2960* 2284*  --  1983* 1449* 951*  --   --   --   --   --   TROPONINI 0.19* 0.34*  < > 0.12* 0.06*  --  0.13* 0.07* 0.06* 0.06* 0.20*  < > = values in this interval not displayed.  BNP (last 3 results) No results for input(s): BNP in the last 8760 hours.  CBG:  Recent Labs Lab 06/23/15 0732 06/23/15 1135 06/23/15 1848 06/23/15 2354 06/24/15 0523  GLUCAP 161* 137* 171* 139* 158*    Recent Results (from the past 240 hour(s))  Urine culture     Status: None   Collection Time: 06/18/15  1:18 AM  Result Value Ref Range Status   Specimen Description URINE, CATHETERIZED  Final   Special Requests NONE  Final   Culture NO GROWTH 1 DAY  Final   Report Status 06/19/2015 FINAL  Final  Surgical pcr screen     Status: None   Collection Time: 06/19/15  5:25 AM  Result Value Ref Range Status   MRSA, PCR NEGATIVE NEGATIVE  Final   Staphylococcus aureus NEGATIVE NEGATIVE Final    Comment:        The Xpert SA Assay (FDA approved for NASAL specimens in patients over 1 years of age), is one component of a comprehensive surveillance program.  Test performance has been validated by [facility] for patients greater than or equal to 21 year old. It is not intended to diagnose infection nor to guide or monitor treatment.      Studies: Dg Abd Portable 1v-small Bowel Obstruction Protocol-initial, 8 Hr Delay  06/23/2015  CLINICAL DATA:  75 year old male with a history of small bowel obstruction. EXAM: PORTABLE ABDOMEN - 1 VIEW  COMPARISON:  Multiple prior plain film, most recent 06/23/2015, 06/22/2015, 06/21/2015, 06/20/2015, CT 06/19/2015 FINDINGS: Gastric tube projects over the lower mediastinum and terminates in the region of the stomach, unchanged. There has been progression of enteric contrast to the distal small bowel and colon. The opacified terminal ileum and length of the colon a decompressed. Small bowel loops within the mid and left abdomen are persistently borderline dilated, although the diameter is not as exaggerated as the most recent comparison plain film. IMPRESSION: Compared to prior plain films there has been progression of enteric contrast to the terminal ileum and throughout the length of the colon, with decreasing diameter of borderline dilated small bowel loops in the mid left abdomen. Findings suggest resolving partial bowel obstruction. Unchanged gastric tube. Signed, Dulcy Fanny. Earleen Newport, DO [facility] Electronically Signed   By: Corrie Mckusick D.O.   On: 06/23/2015 20:40     Scheduled Meds: . antiseptic oral rinse  7 mL Mouth Rinse q12n4p  . brimonidine  1 drop Both Eyes BID  . chlorhexidine  15 mL Mouth Rinse BID  . feeding supplement (GLUCERNA SHAKE)  237 mL Oral TID BM  . heparin subcutaneous  5,000 Units Subcutaneous 3 times per day  . insulin aspart  0-9 Units Subcutaneous 4 times per day  . insulin glargine  10 Units Subcutaneous Daily  . latanoprost  1 drop Both Eyes QHS  . pantoprazole (PROTONIX) IV  40 mg Intravenous Q12H  . piperacillin-tazobactam (ZOSYN)  IV  2.25 g Intravenous Q8H  . sodium chloride  3 mL Intravenous Q12H   Continuous Infusions:  PRN Meds: dextrose, diphenhydrAMINE, HYDROmorphone (DILAUDID) injection, metoprolol, ondansetron (ZOFRAN) IV  Time spent: 30 minutes  Author: Berle Mull, MD [facility] Pager: [phone] 06/24/2015 12:52 PM  If 7PM-7AM, please contact night-coverage at [email or web address],  password [facility]

## 2015-06-24 NOTE — Progress Notes (Signed)
   06/24/15 1000  Clinical Encounter Type  Visited With Health care provider;Patient not available;Family  Visit Type Initial  Referral From (Acute rehab)  [place] spoke with daughter from [place] regarding [place] and other issues regarding pt care; [place] referred to visit with pt by Acute Rehab based on their evaluation and pt request; pt dialysis; [place] will follow-up. Candis Schatz Jamae Tison 11:00 AM

## 2015-06-25 LAB — COMPREHENSIVE METABOLIC PANEL
ALBUMIN: 1.6 g/dL — AB (ref 3.5–5.0)
ALK PHOS: 81 U/L (ref 38–126)
ALT: 18 U/L (ref 17–63)
ANION GAP: 13 (ref 5–15)
AST: 18 U/L (ref 15–41)
BILIRUBIN TOTAL: 0.9 mg/dL (ref 0.3–1.2)
BUN: 56 mg/dL — ABNORMAL HIGH (ref 6–20)
CALCIUM: 8 mg/dL — AB (ref 8.9–10.3)
CO2: 24 mmol/L (ref 22–32)
Chloride: 98 mmol/L — ABNORMAL LOW (ref 101–111)
Creatinine, Ser: 6.6 mg/dL — ABNORMAL HIGH (ref 0.61–1.24)
GFR calc Af Amer: 9 mL/min — ABNORMAL LOW (ref >60)
GFR, EST NON AFRICAN AMERICAN: 7 mL/min — AB (ref >60)
GLUCOSE: 289 mg/dL — AB (ref 65–99)
Potassium: 3.7 mmol/L (ref 3.5–5.1)
Sodium: 135 mmol/L (ref 135–145)
TOTAL PROTEIN: 5.8 g/dL — AB (ref 6.5–8.1)

## 2015-06-25 LAB — CBC WITH DIFFERENTIAL/PLATELET
Basophils Absolute: 0.2 10*3/uL — ABNORMAL HIGH (ref 0.0–0.1)
Basophils Relative: 1 %
Eosinophils Absolute: 0.2 10*3/uL (ref 0.0–0.7)
Eosinophils Relative: 1 %
HCT: 28.8 % — ABNORMAL LOW (ref 39.0–52.0)
Hemoglobin: 9.8 g/dL — ABNORMAL LOW (ref 13.0–17.0)
Lymphocytes Relative: 11 %
Lymphs Abs: 1.9 10*3/uL (ref 0.7–4.0)
MCH: 28.2 pg (ref 26.0–34.0)
MCHC: 34 g/dL (ref 30.0–36.0)
MCV: 83 fL (ref 78.0–100.0)
Monocytes Absolute: 1.4 10*3/uL — ABNORMAL HIGH (ref 0.1–1.0)
Monocytes Relative: 8 %
Neutro Abs: 13.6 10*3/uL — ABNORMAL HIGH (ref 1.7–7.7)
Neutrophils Relative %: 79 %
Platelets: 319 10*3/uL (ref 150–400)
RBC: 3.47 MIL/uL — ABNORMAL LOW (ref 4.22–5.81)
RDW: 14.1 % (ref 11.5–15.5)
WBC: 17.3 10*3/uL — ABNORMAL HIGH (ref 4.0–10.5)

## 2015-06-25 LAB — GLUCOSE, CAPILLARY
GLUCOSE-CAPILLARY: 367 mg/dL — AB (ref 65–99)
GLUCOSE-CAPILLARY: 332 mg/dL — AB (ref 65–99)
GLUCOSE-CAPILLARY: 280 mg/dL — AB (ref 65–99)
Glucose-Capillary: 326 mg/dL — ABNORMAL HIGH (ref 65–99)
Glucose-Capillary: 279 mg/dL — ABNORMAL HIGH (ref 65–99)

## 2015-06-25 LAB — RENAL FUNCTION PANEL
Albumin: 1.7 g/dL — ABNORMAL LOW (ref 3.5–5.0)
Anion gap: 13 (ref 5–15)
BUN: 56 mg/dL — ABNORMAL HIGH (ref 6–20)
CO2: 24 mmol/L (ref 22–32)
Calcium: 8.1 mg/dL — ABNORMAL LOW (ref 8.9–10.3)
Chloride: 98 mmol/L — ABNORMAL LOW (ref 101–111)
Creatinine, Ser: 6.46 mg/dL — ABNORMAL HIGH (ref 0.61–1.24)
GFR calc Af Amer: 9 mL/min — ABNORMAL LOW (ref >60)
GFR calc non Af Amer: 8 mL/min — ABNORMAL LOW (ref >60)
Glucose, Bld: 288 mg/dL — ABNORMAL HIGH (ref 65–99)
Phosphorus: 4.3 mg/dL (ref 2.5–4.6)
Potassium: 3.7 mmol/L (ref 3.5–5.1)
Sodium: 135 mmol/L (ref 135–145)

## 2015-06-25 LAB — TYPE AND SCREEN
ABO/RH(D): O POS
Antibody Screen: NEGATIVE

## 2015-06-25 MED ORDER — INSULIN ASPART 100 UNIT/ML ~~LOC~~ SOLN
4.0000 [IU] | Freq: Three times a day (TID) | SUBCUTANEOUS | Status: DC
  Administered 2015-06-25 – 2015-06-27 (×3): 4 [IU] via SUBCUTANEOUS

## 2015-06-25 MED ORDER — TRAMADOL HCL 50 MG PO TABS
50.0000 mg | ORAL_TABLET | Freq: Four times a day (QID) | ORAL | Status: DC | PRN
  Administered 2015-06-30: 50 mg via ORAL
  Filled 2015-06-25: qty 1

## 2015-06-25 MED ORDER — NEPRO/CARBSTEADY PO LIQD
237.0000 mL | Freq: Three times a day (TID) | ORAL | Status: DC
  Administered 2015-06-25 – 2015-07-01 (×10): 237 mL via ORAL
  Filled 2015-06-25 (×4): qty 237

## 2015-06-25 MED ORDER — HYDRALAZINE HCL 25 MG PO TABS
100.0000 mg | ORAL_TABLET | Freq: Two times a day (BID) | ORAL | Status: DC
  Administered 2015-06-25 – 2015-07-05 (×19): 100 mg via ORAL
  Filled 2015-06-25 (×20): qty 4

## 2015-06-25 MED ORDER — GABAPENTIN 300 MG PO CAPS
300.0000 mg | ORAL_CAPSULE | Freq: Two times a day (BID) | ORAL | Status: DC
  Administered 2015-06-25 – 2015-07-08 (×25): 300 mg via ORAL
  Filled 2015-06-25 (×2): qty 1
  Filled 2015-06-25: qty 3
  Filled 2015-06-25 (×9): qty 1
  Filled 2015-06-25: qty 3
  Filled 2015-06-25 (×6): qty 1
  Filled 2015-06-25: qty 3
  Filled 2015-06-25: qty 1
  Filled 2015-06-25: qty 3
  Filled 2015-06-25 (×4): qty 1

## 2015-06-25 MED ORDER — INSULIN ASPART 100 UNIT/ML ~~LOC~~ SOLN
0.0000 [IU] | Freq: Every day | SUBCUTANEOUS | Status: DC
  Administered 2015-06-25: 3 [IU] via SUBCUTANEOUS
  Administered 2015-06-26: 5 [IU] via SUBCUTANEOUS
  Administered 2015-06-27 – 2015-07-08 (×2): 2 [IU] via SUBCUTANEOUS
  Administered 2015-07-10: 1 [IU] via SUBCUTANEOUS

## 2015-06-25 MED ORDER — ROSUVASTATIN CALCIUM 20 MG PO TABS
20.0000 mg | ORAL_TABLET | Freq: Every day | ORAL | Status: DC
  Administered 2015-06-25 – 2015-07-15 (×20): 20 mg via ORAL
  Filled 2015-06-25 (×21): qty 1

## 2015-06-25 MED ORDER — TRAZODONE HCL 150 MG PO TABS
150.0000 mg | ORAL_TABLET | Freq: Every evening | ORAL | Status: DC | PRN
  Administered 2015-06-26 – 2015-06-30 (×3): 300 mg via ORAL
  Administered 2015-07-04 – 2015-07-12 (×4): 150 mg via ORAL
  Filled 2015-06-25 (×12): qty 2

## 2015-06-25 MED ORDER — CARVEDILOL 6.25 MG PO TABS
6.2500 mg | ORAL_TABLET | Freq: Two times a day (BID) | ORAL | Status: DC
  Administered 2015-06-26 – 2015-07-07 (×20): 6.25 mg via ORAL
  Filled 2015-06-25 (×20): qty 1

## 2015-06-25 MED ORDER — FLUTICASONE PROPIONATE 50 MCG/ACT NA SUSP
1.0000 | Freq: Every day | NASAL | Status: DC
  Administered 2015-06-25 – 2015-07-15 (×18): 1 via NASAL
  Filled 2015-06-25 (×2): qty 16

## 2015-06-25 MED ORDER — OXYCODONE HCL 5 MG PO TABS
5.0000 mg | ORAL_TABLET | Freq: Four times a day (QID) | ORAL | Status: DC | PRN
  Administered 2015-06-29 – 2015-07-07 (×3): 5 mg via ORAL
  Filled 2015-06-25 (×4): qty 1

## 2015-06-25 MED ORDER — INSULIN ASPART 100 UNIT/ML ~~LOC~~ SOLN
0.0000 [IU] | Freq: Three times a day (TID) | SUBCUTANEOUS | Status: DC
  Administered 2015-06-25 – 2015-06-26 (×2): 11 [IU] via SUBCUTANEOUS
  Administered 2015-06-27: 8 [IU] via SUBCUTANEOUS
  Administered 2015-06-27: 11 [IU] via SUBCUTANEOUS
  Administered 2015-06-27: 8 [IU] via SUBCUTANEOUS
  Administered 2015-06-28: 2 [IU] via SUBCUTANEOUS
  Administered 2015-06-28 (×2): 3 [IU] via SUBCUTANEOUS

## 2015-06-25 NOTE — Progress Notes (Signed)
[facility] Rounding Note  Subjective:   Awake, alert, in good spirits NG out, tolerating some clear liquids "part of the time" he says Had HD yesterday well tolerated No urine output  Physical Exam:  BP 141/79 mmHg  Pulse 76  Temp(Src) 98.4 F (36.9 C) (Axillary)  Resp 18  Ht 6\' 2"  (1.88 m)  Wt 95.5 kg (210 lb 8.6 oz)  BMI 27.02 kg/m2  SpO2 99%  GEN: NAD, elderly male lying in bed. Very pleasant.   ENT: NG out. CV: Irreg S1S2 with wide split S2 PULM: Ant clear. ABD: Mod abd distension. Hypoactive BS. No focal tenderness  EXT:No edema. Foot not examined (wrapped). R IJ Temp HD cath  (12/30)   Scheduled Meds: . antiseptic oral rinse  7 mL Mouth Rinse q12n4p  . brimonidine  1 drop Both Eyes BID  . chlorhexidine  15 mL Mouth Rinse BID  . feeding supplement (GLUCERNA SHAKE)  237 mL Oral TID BM  . insulin aspart  0-9 Units Subcutaneous 4 times per day  . insulin glargine  10 Units Subcutaneous Daily  . latanoprost  1 drop Both Eyes QHS  . pantoprazole (PROTONIX) IV  40 mg Intravenous Q12H  . piperacillin-tazobactam (ZOSYN)  IV  2.25 g Intravenous Q8H  . sodium chloride  3 mL Intravenous Q12H       PRN Meds:.dextrose, diphenhydrAMINE, HYDROmorphone (DILAUDID) injection, metoprolol, ondansetron (ZOFRAN) IV    Recent Labs Lab 06/23/15 0420 06/24/15 0530 06/25/15 0248  NA 137 140 135  135  K 3.9 4.3 3.7  3.7  CL 97* 100* 98*  98*  CO2 24 20* 24  24  GLUCOSE 159* 165* 289*  288*  BUN 63* 87* 56*  56*  CREATININE 6.88* 9.59* 6.60*  6.46*  CALCIUM 8.4* 8.6* 8.0*  8.1*  PHOS 5.6* 7.8* 4.3    Recent Labs Lab 06/23/15 0420 06/24/15 0530 06/25/15 0248  WBC 18.9* 16.8* 17.3*  NEUTROABS  --   --  13.6*  HGB 9.8* 10.6* 9.8*  HCT 29.2* 31.6* 28.8*  MCV 80.9 82.1 83.0  PLT 300 312 319    Background: 62M with renal insufficiency, unclear duration (creatinine 2.0 04/2015), mild hyperkalemia, admitted after down on ground for 3d with  creatinine of 4; on ACEi; with mild rhabdomyolysis; septic with diabetic foot ulcer/gangrene. Hospital course complicated by AF/RVR, GIB. Progressive AKI, oligoanuric. HD initiated 12/31 (R IJ tem cath 12/30)  ASSESSMENT/RECOMMENDATIONS  1. Anuric AKI on CKD3 (SCr 04/2015 was 2.0) (Probable diabetic nephropathy with baseline CKD3).  Rhabdo/gangrenous foot w/sepsis/vol depletion/ACE PTA-->all led to ischemic ATN.  s/p R IJ Temp HD cath 06/19/15 and started HD 12/31. Still negligible UOP. Foley out. Next HD 06/26/15. 2. Dialysis access: R IJ temp cath (12/30) 3. Diabetic foot ulcer/gangrene of left 1st 2 toes - deferred until more stable per Dr. Sharol Given 4. Sepsis d/t #2. Vanco and zosyn 5. Partial SBO with partial small bowel malrotation.  Resolving. NG tube out. Clear liquids.  6. UGIB Felt gastritis/asa/tube trauma/uremia. Had some hematemesis after NG pulled yesterday - none since. Hb with some variation last 3 days no trend. On protonix 7. Anemia - Fe def. Rec'd Feraheme 12/31. When Hb consistently <10, will add Aranesp. 8. Mild rhabdomyolysis at admission - resolved 9. DM2 10. HTN  11. AFib with RVR - occurred with HD 12/31. BB for rate control (IV prn).  12. + trops. ? Demand ischemia. EF 50-55% 13. [place], MD [facility]  [phone] Pager 06/25/2015, 9:34 AM

## 2015-06-25 NOTE — Progress Notes (Signed)
[facility] Progress Note  Patient: David Ford [id number]   PCP: No primary care provider on file. DOB: [birth year]   DOA: 06/17/2015   DOS: 06/25/2015   Date of Service: the patient was seen and examined on 06/25/2015  Subjective: Patient denies any complaints of abdominal pain. Had an episode of hemoptysis yesterday. Denies having any fever or chills Nutrition: Able to tolerate clear liquid diet. Activity: Out of bed to chair Last BM: 06/24/2015  Assessment and Plan: 1. Diabetic foot infection ([place]) Sepsis, most likely secondary to cellulitis with diabetes. No blood culture on admission, MRSA screen negative,  Orthopedic consulted again. Continue Zosyn.  2. Small bowel obstruction partial. General surgery consulted, currently diet is advance to full liquid diet.  3. Upper GI bleed. One episode of hemoptysis, probable gastritis, no further episode. H&H remains stable. GI consulted initially and thought to be secondary to trauma from NG tube insertion. Recheck in the evening.  4. Anuric acute on chronic kidney disease stage III. Possible diabetic nephropathy. Nephrology following with the patient. Foley catheter discontinued, no urine output, will check bladder scan. Appreciate input from nephrology. Continue acute hemodialysis as needed per nephrology.  5. Elevated troponin. Echocardiogram 50-55% EF, no wall motion abnormality, grade 1 diastolic dysfunction. As per earlier discussion with cardiology patient will need outpatient workup. It was thought most likely secondary to renal failure as well as demand ischemia.  6. Atrial fibrillation. Paroxysmal, currently normal sinus rhythm spontaneously [place] Vasc score elevated. Will need anticoagulation, currently holding in the setting of gastritis with probable upper GI bleed Also awaiting recommendations from orthopedics regarding procedure. started on heparin earlier.  7. Bilateral shoulder pain. X-ray shoulder no  fracture. May need MRI as an outpatient.  8. Essential hypertension. Resume oral medication.  9. Type 2 diabetes mellitus, uncontrolled Hemoglobin A1c 14.2 December 16. Since the patient is taking orally I will switch his sliding scale from sensitivity to moderate, continue 10 units Lantus.  DVT Prophylaxis: subcutaneous Heparin Nutrition: Full liquid diet Advance goals of care discussion: Full code  Brief Summary of Hospitalization:  HPI: As per the H and P dictated on admission, "David Ford is a 75 y.o. male   has a past medical history of Diabetes mellitus without complication ([place]) and Hypertension.   Presented with a fall onto the floor from the couch. He has communicated that has been down on the floor for past 3 days. He was not able to walk but could crawl and states could reach fridge and microwave so he ate and drank. He reports he was able able to call on the phone the entire time but did not request any help until today when he realized he was not getting any better. He has been living in [place] for past 20 years but has a PCP in [place]. He reports flying there every 3 months.  Patient lives at home alone he was found today by the friend who called EMS. Patient has history of diabetes but his glucose meter has been broken. Patient has been fatigued. Denies any loss of consciousness patient reports some file order from his toes. Reports generalized muscle aches. Reports loss of strength in arms and legs. Reports he was able to urinate today.  He is unsure if he has any history of kidney disease. Unsure if he's been taking his medications Emergency department he was found to have evidence of rhabdomyolysis with CKs 3053, his initial lab work showing creatinine of 4 potassium was 6.0 and an gap  was 16. Patient was unable to produce urine. Troponin slightly elevated to 0.05 albumin 2.9 White blood cell, was noted to be elevated 16.2 Hospitalist was called for admission  for dehydration rhabdomyolysis evidence of acute renal failure" Procedures: Echocardiogram 06/18/2015 50-55% ejection fraction. Hemodialysis catheter insertion 06/19/2015 Consultants: Nephrology, gastroenterology, general surgery, orthopedic surgery Dr. Sharol Given, critical care for dialysis catheter placement. Antibiotics: Anti-infectives    Start     Dose/Rate Route Frequency Ordered Stop   06/23/15 0100  piperacillin-tazobactam (ZOSYN) IVPB 2.25 g     2.25 g 100 mL/hr over 30 Minutes Intravenous Every 8 hours 06/22/15 1912     06/22/15 1600  vancomycin (VANCOCIN) IVPB 1000 mg/200 mL premix     1,000 mg 200 mL/hr over 60 Minutes Intravenous  Once 06/22/15 1448 06/22/15 1730   06/20/15 1400  vancomycin (VANCOCIN) IVPB 1000 mg/200 mL premix     1,000 mg 200 mL/hr over 60 Minutes Intravenous  Once 06/20/15 1205 06/20/15 1538   06/19/15 2100  vancomycin (VANCOCIN) IVPB 1000 mg/200 mL premix  Status:  Discontinued     1,000 mg 200 mL/hr over 60 Minutes Intravenous Every 48 hours 06/17/15 2214 06/18/15 0945   06/18/15 1200  vancomycin (VANCOCIN) IVPB 1000 mg/200 mL premix  Status:  Discontinued     1,000 mg 200 mL/hr over 60 Minutes Intravenous Every 48 hours 06/18/15 0945 06/20/15 1204   06/18/15 0530  piperacillin-tazobactam (ZOSYN) IVPB 2.25 g  Status:  Discontinued     2.25 g 100 mL/hr over 30 Minutes Intravenous Every 8 hours 06/17/15 2214 06/22/15 1912   06/17/15 2115  piperacillin-tazobactam (ZOSYN) IVPB 3.375 g     3.375 g 100 mL/hr over 30 Minutes Intravenous  Once 06/17/15 2111 06/17/15 2151   06/17/15 2115  vancomycin (VANCOCIN) IVPB 1000 mg/200 mL premix     1,000 mg 200 mL/hr over 60 Minutes Intravenous  Once 06/17/15 2111 06/18/15 0235      Family Communication: no family was present at bedside, at the time of interview.   Disposition: Transferred out of the stepdown unit today. Barriers to safe discharge: Improvement in renal function,   Intake/Output Summary (Last 24  hours) at 06/25/15 1349 Last data filed at 06/25/15 0800  Gross per 24 hour  Intake    340 ml  Output     -3 ml  Net    343 ml   Filed Weights   06/22/15 1354 06/22/15 1745 06/24/15 1050  Weight: 95.4 kg (210 lb 5.1 oz) 94.4 kg (208 lb 1.8 oz) 95.5 kg (210 lb 8.6 oz)    Objective: Physical Exam: Filed Vitals:   06/24/15 2309 06/25/15 0309 06/25/15 0727 06/25/15 1225  BP: 131/77 141/79 136/84   Pulse: 89 76 84   Temp: 98.3 F (36.8 C) 97.8 F (36.6 C) 98.4 F (36.9 C) 98.9 F (37.2 C)  TempSrc: Oral Axillary Axillary Axillary  Resp: 13 18 13    Height:      Weight:      SpO2: 94% 99% 97%     General: Appear in mild distress, no Rash; Oral Mucosa moist. Cardiovascular: S1 and S2 Present, no Murmur,  Respiratory: Bilateral Air entry present and Clear to Auscultation, no Crackles, no wheezes Abdomen: Bowel Sound present, Soft and no tenderness Extremities: no Pedal edema, no calf tenderness  Data Reviewed: CBC:  Recent Labs Lab 06/21/15 0203 06/22/15 0530 06/23/15 0420 06/24/15 0530 06/25/15 0248  WBC 20.2* 19.0* 18.9* 16.8* 17.3*  NEUTROABS  --   --   --   --  13.6*  HGB 11.2* 11.2* 9.8* 10.6* 9.8*  HCT 32.3* 32.8* 29.2* 31.6* 28.8*  MCV 80.8 80.4 80.9 82.1 83.0  PLT 273 292 300 312 [id number]   Basic Metabolic Panel:  Recent Labs Lab 06/21/15 1725 06/22/15 0530 06/23/15 0420 06/24/15 0530 06/25/15 0248  NA 137 137 137 140 135  135  K 4.3 4.3 3.9 4.3 3.7  3.7  CL 97* 97* 97* 100* 98*  98*  CO2 24 22 24  20* 24  24  GLUCOSE 133* 113* 159* 165* 289*  288*  BUN 90* 103* 63* 87* 56*  56*  CREATININE 9.14* 9.94* 6.88* 9.59* 6.60*  6.46*  CALCIUM 8.2* 8.4* 8.4* 8.6* 8.0*  8.1*  PHOS 6.0* 6.7* 5.6* 7.8* 4.3   Liver Function Tests:  Recent Labs Lab 06/19/15 1430  06/21/15 1725 06/22/15 0530 06/23/15 0420 06/24/15 0530 06/25/15 0248  AST 69*  --   --   --   --   --  18  ALT 45  --   --   --   --   --  18  ALKPHOS 83  --   --   --   --   --  81    BILITOT 1.0  --   --   --   --   --  0.9  PROT 5.7*  --   --   --   --   --  5.8*  ALBUMIN 2.1*  < > 1.9* 1.9* 1.8* 1.8* 1.6*  1.7*  < > = values in this interval not displayed. No results for input(s): LIPASE, AMYLASE in the last 168 hours.  Recent Labs Lab 06/20/15 0421  AMMONIA 21    Cardiac Enzymes:  Recent Labs Lab 06/18/15 1815 06/19/15 0617 06/19/15 1430 06/19/15 2322 06/20/15 0423 06/20/15 1730 06/21/15 0201  CKTOTAL 1449* 951*  --   --   --   --   --   TROPONINI 0.06*  --  0.13* 0.07* 0.06* 0.06* 0.20*    BNP (last 3 results) No results for input(s): BNP in the last 8760 hours.  CBG:  Recent Labs Lab 06/24/15 1458 06/24/15 1749 06/24/15 2311 06/25/15 0532 06/25/15 1223  GLUCAP 161* 203* 326* 279* 367*    Recent Results (from the past 240 hour(s))  Urine culture     Status: None   Collection Time: 06/18/15  1:18 AM  Result Value Ref Range Status   Specimen Description URINE, CATHETERIZED  Final   Special Requests NONE  Final   Culture NO GROWTH 1 DAY  Final   Report Status 06/19/2015 FINAL  Final  Surgical pcr screen     Status: None   Collection Time: 06/19/15  5:25 AM  Result Value Ref Range Status   MRSA, PCR NEGATIVE NEGATIVE Final   Staphylococcus aureus NEGATIVE NEGATIVE Final    Comment:        The Xpert SA Assay (FDA approved for NASAL specimens in patients over 74 years of age), is one component of a comprehensive surveillance program.  Test performance has been validated by [facility] for patients greater than or equal to 54 year old. It is not intended to diagnose infection nor to guide or monitor treatment.      Studies: No results found.   Scheduled Meds: . antiseptic oral rinse  7 mL Mouth Rinse q12n4p  . brimonidine  1 drop Both Eyes BID  . chlorhexidine  15 mL Mouth Rinse BID  . feeding supplement (NEPRO CARB STEADY)  237 mL Oral TID BM  . insulin aspart  0-15 Units Subcutaneous TID WC  . insulin aspart  0-5  Units Subcutaneous QHS  . insulin aspart  4 Units Subcutaneous TID WC  . insulin glargine  10 Units Subcutaneous Daily  . latanoprost  1 drop Both Eyes QHS  . pantoprazole (PROTONIX) IV  40 mg Intravenous Q12H  . piperacillin-tazobactam (ZOSYN)  IV  2.25 g Intravenous Q8H  . sodium chloride  3 mL Intravenous Q12H   Continuous Infusions:  PRN Meds: dextrose, diphenhydrAMINE, HYDROmorphone (DILAUDID) injection, metoprolol, ondansetron (ZOFRAN) IV  Time spent: 30 minutes  Author: Berle Mull, MD [facility] Pager: [phone] 06/25/2015 1:49 PM  If 7PM-7AM, please contact night-coverage at [email or web address], password [facility]

## 2015-06-25 NOTE — Progress Notes (Signed)
Patient had a large green/black VERY foamy poop. Paged MD to notify. No orders received. Will continue to monitor.

## 2015-06-25 NOTE — Progress Notes (Signed)
Nutrition Follow-up  DOCUMENTATION CODES:   Not applicable  INTERVENTION:    Change PO supplement from Glucerna to Nepro Shake po TID, each supplement provides 425 kcal and 19 grams protein  NUTRITION DIAGNOSIS:   Increased nutrient needs related to wound healing as evidenced by estimated needs.  Ongoing  GOAL:   Patient will meet greater than or equal to 90% of their needs  Unmet  MONITOR:   PO intake, Supplement acceptance, Skin, Weight trends, Labs, I & O's  ASSESSMENT:   75 yo M with hx of DM, HTN Here with generalized weakness was found to have ARF most likely due to dehydration in the setting of ACEi and was found to have untreated diabetic foot ulcer  Patient with a lot of diarrhea per RN. He has been eating minimally. Currently on a full liquid diet. He reported that he ate some soup and jello at breakfast today. Patient sipping on a Glucerna Shake during RD visit. He agreed to trying Nepro Shakes between meals. Nepro has more calories and protein than Glucerna. Nepro also has a special CHO blend designed to help manage blood glucose response.   Diet Order:  Diet full liquid Room service appropriate?: Yes; Fluid consistency:: Thin  Skin:  Wound (see comment) (Diabetic ulcer L toe)  Last BM:  1/5 (watery, loose)  Height:   Ht Readings from Last 1 Encounters:  06/19/15 6\' 2"  (1.88 m)    Weight:   Wt Readings from Last 1 Encounters:  06/24/15 210 lb 8.6 oz (95.5 kg)    Ideal Body Weight:  86.4 kg  BMI:  Body mass index is 27.02 kg/(m^2).  Estimated Nutritional Needs:   Kcal:  2400-2600  Protein:  115-125 grams  Fluid:  >/= 2.4 L/day  EDUCATION NEEDS:   No education needs identified at this time  Molli Barrows, [place], [place], [place] Pager [phone] After Hours Pager [phone]

## 2015-06-25 NOTE — Progress Notes (Signed)
Patient discussed with Dr. Sharol Given and he will follow up.

## 2015-06-25 NOTE — Progress Notes (Signed)
Patient ID: David Ford, male   DOB: 1941/05/29, 75 y.o.   MRN: [id number]     [address] [place], [place] [address]    Phone: [phone] FAX: [phone]     Subjective: No n/v.  No abd pain.  Having BMs.  Tolerating clears.   Objective:  Vital signs:  Filed Vitals:   06/24/15 2132 06/24/15 2309 06/25/15 0309 06/25/15 0727  BP: 128/84 131/77 141/79   Pulse: 95 89 76   Temp: 98.7 F (37.1 C) 98.3 F (36.8 C) 97.8 F (36.6 C) 98.4 F (36.9 C)  TempSrc: Oral Oral Axillary Axillary  Resp: 13 13 18    Height:      Weight:      SpO2: 98% 94% 99%     Last BM Date: 06/24/15  Intake/Output   Yesterday:  01/04 0701 - 01/05 0700 In: 290 [P.O.:240; IV Piggyback:50] Out: -3  This shift:    Physical Exam: General: Pt awake/alert/oriented x4 in no acute distress  Abdomen: Soft. Nondistended. Non tender. No evidence of peritonitis. No incarcerated hernias.   Problem List:   Principal Problem:   Diabetic foot infection ([place]) Active Problems:   Hypertension   Leukocytosis   Elevated troponin   Hyponatremia   Hypoalbuminemia   Hyperkalemia   Rhabdomyolysis   Metabolic acidosis   Acute renal failure (ARF) (HCC)   DM type 2 causing renal disease (HCC)   Dehydration   Debility   Ileus (HCC)   SBO (small bowel obstruction) (HCC)   Gastrointestinal hemorrhage with hematemesis   Atrial fibrillation ([place])    Results:   Labs: Results for orders placed or performed during the hospital encounter of 06/17/15 (from the past 48 hour(s))  Glucose, capillary     Status: Abnormal   Collection Time: 06/23/15 11:35 AM  Result Value Ref Range   Glucose-Capillary 137 (H) 65 - 99 mg/dL   Comment 1 Notify RN    Comment 2 Document in Chart   Glucose, capillary     Status: Abnormal   Collection Time: 06/23/15  6:48 PM  Result Value Ref Range   Glucose-Capillary 171 (H) 65 - 99 mg/dL   Comment 1 Notify  RN    Comment 2 Document in Chart   Glucose, capillary     Status: Abnormal   Collection Time: 06/23/15 11:54 PM  Result Value Ref Range   Glucose-Capillary 139 (H) 65 - 99 mg/dL  Glucose, capillary     Status: Abnormal   Collection Time: 06/24/15  5:23 AM  Result Value Ref Range   Glucose-Capillary 158 (H) 65 - 99 mg/dL  Renal function panel     Status: Abnormal   Collection Time: 06/24/15  5:30 AM  Result Value Ref Range   Sodium 140 135 - 145 mmol/L   Potassium 4.3 3.5 - 5.1 mmol/L   Chloride 100 (L) 101 - 111 mmol/L   CO2 20 (L) 22 - 32 mmol/L   Glucose, Bld 165 (H) 65 - 99 mg/dL   BUN 87 (H) 6 - 20 mg/dL   Creatinine, Ser 9.59 (H) 0.61 - 1.24 mg/dL   Calcium 8.6 (L) 8.9 - 10.3 mg/dL   Phosphorus 7.8 (H) 2.5 - 4.6 mg/dL   Albumin 1.8 (L) 3.5 - 5.0 g/dL   GFR calc non Af Amer 5 (L) >60 mL/min   GFR calc Af Amer 5 (L) >60 mL/min    Comment: (NOTE) The eGFR  has been calculated using the CKD EPI equation. This calculation has not been validated in all clinical situations. eGFR's persistently <60 mL/min signify possible Chronic Kidney Disease.    Anion gap 20 (H) 5 - 15  CBC     Status: Abnormal   Collection Time: 06/24/15  5:30 AM  Result Value Ref Range   WBC 16.8 (H) 4.0 - 10.5 K/uL   RBC 3.85 (L) 4.22 - 5.81 MIL/uL   Hemoglobin 10.6 (L) 13.0 - 17.0 g/dL   HCT 31.6 (L) 39.0 - 52.0 %   MCV 82.1 78.0 - 100.0 fL   MCH 27.5 26.0 - 34.0 pg   MCHC 33.5 30.0 - 36.0 g/dL   RDW 14.2 11.5 - 15.5 %   Platelets 312 150 - 400 K/uL  Glucose, capillary     Status: Abnormal   Collection Time: 06/24/15  2:58 PM  Result Value Ref Range   Glucose-Capillary 161 (H) 65 - 99 mg/dL  Glucose, capillary     Status: Abnormal   Collection Time: 06/24/15  5:49 PM  Result Value Ref Range   Glucose-Capillary 203 (H) 65 - 99 mg/dL  Glucose, capillary     Status: Abnormal   Collection Time: 06/24/15 11:11 PM  Result Value Ref Range   Glucose-Capillary 326 (H) 65 - 99 mg/dL  Renal function  panel     Status: Abnormal   Collection Time: 06/25/15  2:48 AM  Result Value Ref Range   Sodium 135 135 - 145 mmol/L   Potassium 3.7 3.5 - 5.1 mmol/L   Chloride 98 (L) 101 - 111 mmol/L   CO2 24 22 - 32 mmol/L   Glucose, Bld 288 (H) 65 - 99 mg/dL   BUN 56 (H) 6 - 20 mg/dL   Creatinine, Ser 6.46 (H) 0.61 - 1.24 mg/dL   Calcium 8.1 (L) 8.9 - 10.3 mg/dL   Phosphorus 4.3 2.5 - 4.6 mg/dL   Albumin 1.7 (L) 3.5 - 5.0 g/dL   GFR calc non Af Amer 8 (L) >60 mL/min   GFR calc Af Amer 9 (L) >60 mL/min    Comment: (NOTE) The eGFR has been calculated using the CKD EPI equation. This calculation has not been validated in all clinical situations. eGFR's persistently <60 mL/min signify possible Chronic Kidney Disease.    Anion gap 13 5 - 15  CBC with Differential/Platelet     Status: Abnormal   Collection Time: 06/25/15  2:48 AM  Result Value Ref Range   WBC 17.3 (H) 4.0 - 10.5 K/uL   RBC 3.47 (L) 4.22 - 5.81 MIL/uL   Hemoglobin 9.8 (L) 13.0 - 17.0 g/dL   HCT 28.8 (L) 39.0 - 52.0 %   MCV 83.0 78.0 - 100.0 fL   MCH 28.2 26.0 - 34.0 pg   MCHC 34.0 30.0 - 36.0 g/dL   RDW 14.1 11.5 - 15.5 %   Platelets 319 150 - 400 K/uL   Neutrophils Relative % 79 %   Lymphocytes Relative 11 %   Monocytes Relative 8 %   Eosinophils Relative 1 %   Basophils Relative 1 %   Neutro Abs 13.6 (H) 1.7 - 7.7 K/uL   Lymphs Abs 1.9 0.7 - 4.0 K/uL   Monocytes Absolute 1.4 (H) 0.1 - 1.0 K/uL   Eosinophils Absolute 0.2 0.0 - 0.7 K/uL   Basophils Absolute 0.2 (H) 0.0 - 0.1 K/uL   Smear Review MORPHOLOGY UNREMARKABLE   Comprehensive metabolic panel     Status: Abnormal   Collection  Time: 06/25/15  2:48 AM  Result Value Ref Range   Sodium 135 135 - 145 mmol/L   Potassium 3.7 3.5 - 5.1 mmol/L   Chloride 98 (L) 101 - 111 mmol/L   CO2 24 22 - 32 mmol/L   Glucose, Bld 289 (H) 65 - 99 mg/dL   BUN 56 (H) 6 - 20 mg/dL   Creatinine, Ser 6.60 (H) 0.61 - 1.24 mg/dL   Calcium 8.0 (L) 8.9 - 10.3 mg/dL   Total Protein 5.8 (L)  6.5 - 8.1 g/dL   Albumin 1.6 (L) 3.5 - 5.0 g/dL   AST 18 15 - 41 U/L   ALT 18 17 - 63 U/L   Alkaline Phosphatase 81 38 - 126 U/L   Total Bilirubin 0.9 0.3 - 1.2 mg/dL   GFR calc non Af Amer 7 (L) >60 mL/min   GFR calc Af Amer 9 (L) >60 mL/min    Comment: (NOTE) The eGFR has been calculated using the CKD EPI equation. This calculation has not been validated in all clinical situations. eGFR's persistently <60 mL/min signify possible Chronic Kidney Disease.    Anion gap 13 5 - 15  Glucose, capillary     Status: Abnormal   Collection Time: 06/25/15  5:32 AM  Result Value Ref Range   Glucose-Capillary 279 (H) 65 - 99 mg/dL    Imaging / Studies: Dg Abd Portable 1v-small Bowel Obstruction Protocol-initial, 8 Hr Delay  06/23/2015  CLINICAL DATA:  75 year old male with a history of small bowel obstruction. EXAM: PORTABLE ABDOMEN - 1 VIEW COMPARISON:  Multiple prior plain film, most recent 06/23/2015, 06/22/2015, 06/21/2015, 06/20/2015, CT 06/19/2015 FINDINGS: Gastric tube projects over the lower mediastinum and terminates in the region of the stomach, unchanged. There has been progression of enteric contrast to the distal small bowel and colon. The opacified terminal ileum and length of the colon a decompressed. Small bowel loops within the mid and left abdomen are persistently borderline dilated, although the diameter is not as exaggerated as the most recent comparison plain film. IMPRESSION: Compared to prior plain films there has been progression of enteric contrast to the terminal ileum and throughout the length of the colon, with decreasing diameter of borderline dilated small bowel loops in the mid left abdomen. Findings suggest resolving partial bowel obstruction. Unchanged gastric tube. Signed, Dulcy Fanny. Earleen Newport, DO [facility] Electronically Signed   By: Corrie Mckusick D.O.   On: 06/23/2015 20:40    Medications / Allergies:  Scheduled  Meds: . antiseptic oral rinse  7 mL Mouth Rinse q12n4p  . brimonidine  1 drop Both Eyes BID  . chlorhexidine  15 mL Mouth Rinse BID  . feeding supplement (GLUCERNA SHAKE)  237 mL Oral TID BM  . insulin aspart  0-9 Units Subcutaneous 4 times per day  . insulin glargine  10 Units Subcutaneous Daily  . latanoprost  1 drop Both Eyes QHS  . pantoprazole (PROTONIX) IV  40 mg Intravenous Q12H  . piperacillin-tazobactam (ZOSYN)  IV  2.25 g Intravenous Q8H  . sodium chloride  3 mL Intravenous Q12H   Continuous Infusions:  PRN Meds:.dextrose, diphenhydrAMINE, HYDROmorphone (DILAUDID) injection, metoprolol, ondansetron (ZOFRAN) IV  Antibiotics: Anti-infectives    Start     Dose/Rate Route Frequency Ordered Stop   06/23/15 0100  piperacillin-tazobactam (ZOSYN) IVPB 2.25 g     2.25 g 100 mL/hr over 30 Minutes Intravenous Every 8 hours 06/22/15 1912     06/22/15 1600  vancomycin (VANCOCIN)  IVPB 1000 mg/200 mL premix     1,000 mg 200 mL/hr over 60 Minutes Intravenous  Once 06/22/15 1448 06/22/15 1730   06/20/15 1400  vancomycin (VANCOCIN) IVPB 1000 mg/200 mL premix     1,000 mg 200 mL/hr over 60 Minutes Intravenous  Once 06/20/15 1205 06/20/15 1538   06/19/15 2100  vancomycin (VANCOCIN) IVPB 1000 mg/200 mL premix  Status:  Discontinued     1,000 mg 200 mL/hr over 60 Minutes Intravenous Every 48 hours 06/17/15 2214 06/18/15 0945   06/18/15 1200  vancomycin (VANCOCIN) IVPB 1000 mg/200 mL premix  Status:  Discontinued     1,000 mg 200 mL/hr over 60 Minutes Intravenous Every 48 hours 06/18/15 0945 06/20/15 1204   06/18/15 0530  piperacillin-tazobactam (ZOSYN) IVPB 2.25 g  Status:  Discontinued     2.25 g 100 mL/hr over 30 Minutes Intravenous Every 8 hours 06/17/15 2214 06/22/15 1912   06/17/15 2115  piperacillin-tazobactam (ZOSYN) IVPB 3.375 g     3.375 g 100 mL/hr over 30 Minutes Intravenous  Once 06/17/15 2111 06/17/15 2151   06/17/15 2115  vancomycin (VANCOCIN) IVPB 1000 mg/200 mL premix      1,000 mg 200 mL/hr over 60 Minutes Intravenous  Once 06/17/15 2111 06/18/15 0235       Assessment/Plan PSBO-resolving, advance to fulls.   Erby Pian, [facility] Pager [phone]) For consults and floor pages call [phone]([id number]:30P)  06/25/2015 8:37 AM

## 2015-06-25 NOTE — Progress Notes (Signed)
Occupational Therapy Treatment Patient Details Name: David Ford MRN: [id number] DOB: [birth year] Today's Date: 06/25/2015    History of present illness 75 yo M with hx of DM, HTN Here with generalized weakness was found to have ARF most likely due to dehydration in the setting of ACEi and was found to have untreated diabetic foot ulcer. rhabdomyolysis . ARF. SBO. Awaiting ray amputation of L foot when medically stable.    OT comments  Pt with less complaint of pain in shoulders today; however, increased complaints of fatigue. Will to participate with therapy but only able to tolerate bed to chair transfer due to fatigue. Minimal participation in ADL. Continue to recommend SNF for D/C. Will follow acutely to address established goals.   Follow Up Recommendations  SNF;Supervision/Assistance - 24 hour    Equipment Recommendations  Other (comment)    Recommendations for Other Services      Precautions / Restrictions Precautions Precautions: Fall Precaution Comments: pt with necrotic L great, 1st and second toe however not medically stable for surgery.        Mobility Bed Mobility Overal bed mobility: Needs Assistance Bed Mobility: Sidelying to Sit Rolling: Min assist            Transfers    Min A        No c/o pain in L foot during mobility          Balance             Standing balance-Leahy Scale: Poor                     ADL                       Lower Body Dressing: Moderate assistance       Toileting- Clothing Manipulation and Hygiene: Total assistance Toileting - Clothing Manipulation Details (indicate cue type and reason): incontinent of BM; assisted staff with cleaning up       General ADL Comments: Pt only able to tolerate bed to cvhair only due to fatigue. Very polite.       Vision                     Perception     Praxis      Cognition   Behavior During Therapy: Flat affect Overall Cognitive Status: No  family/caregiver present to determine baseline cognitive functioning                       Extremity/Trunk Assessment   decreased complaints of pain. Will benefit from theraband ex - level 1 - will need to establish goal            Exercises Other Exercises Other Exercises: General BUE AROM. R shoulder limited to 90 degrees.    Shoulder Instructions       General Comments      Pertinent Vitals/ Pain       Pain Assessment: No/denies pain  Home Living                                          Prior Functioning/Environment              Frequency Min 2X/week     Progress Toward Goals  OT Goals(current goals can now be found in the care plan  section)  Progress towards OT goals: Progressing toward goals  Acute Rehab OT Goals Patient Stated Goal: get better OT Goal Formulation: With patient Time For Goal Achievement: 07/07/15 Potential to Achieve Goals: Good ADL Goals Pt Will Perform Grooming: with set-up;with supervision;sitting Pt Will Perform Upper Body Bathing: with set-up;with supervision;sitting Pt Will Perform Lower Body Bathing: with set-up;with supervision;sit to/from stand Pt Will Perform Upper Body Dressing: with set-up;with supervision;sitting Pt Will Transfer to Toilet: with supervision;ambulating;bedside commode Pt Will Perform Toileting - Clothing Manipulation and hygiene: with supervision;sit to/from stand  Plan Discharge plan remains appropriate    Co-evaluation                 End of Session Equipment Utilized During Treatment: Gait belt   Activity Tolerance Patient tolerated treatment well   Patient Left in chair;with call bell/phone within reach   Nurse Communication Mobility status        Time: [id number] OT Time Calculation (min): 18 min  Charges: OT General Charges $OT Visit: 1 Procedure OT Treatments $Self Care/Home Management : 8-22 mins  Ariz Terrones,HILLARY 06/25/2015, 4:06 PM   [facility],  OTR/L  [phone] 06/25/2015

## 2015-06-25 NOTE — Progress Notes (Signed)
Report called to receiving RN on  6 east. VSS. Transferred to 6E16 via bed with personal belongings. Several unsuccessful attempts at trying to notify family of patients new room number.   [facility]

## 2015-06-26 LAB — RENAL FUNCTION PANEL
ANION GAP: 14 (ref 5–15)
Albumin: 1.8 g/dL — ABNORMAL LOW (ref 3.5–5.0)
BUN: 69 mg/dL — ABNORMAL HIGH (ref 6–20)
CHLORIDE: 94 mmol/L — AB (ref 101–111)
CO2: 21 mmol/L — AB (ref 22–32)
Calcium: 7.8 mg/dL — ABNORMAL LOW (ref 8.9–10.3)
Creatinine, Ser: 8.25 mg/dL — ABNORMAL HIGH (ref 0.61–1.24)
GFR, EST AFRICAN AMERICAN: 7 mL/min — AB (ref >60)
GFR, EST NON AFRICAN AMERICAN: 6 mL/min — AB (ref >60)
Glucose, Bld: 243 mg/dL — ABNORMAL HIGH (ref 65–99)
POTASSIUM: 3.7 mmol/L (ref 3.5–5.1)
Phosphorus: 5.2 mg/dL — ABNORMAL HIGH (ref 2.5–4.6)
Sodium: 129 mmol/L — ABNORMAL LOW (ref 135–145)

## 2015-06-26 LAB — CBC
HEMATOCRIT: 28 % — AB (ref 39.0–52.0)
HEMOGLOBIN: 9.5 g/dL — AB (ref 13.0–17.0)
MCH: 28.1 pg (ref 26.0–34.0)
MCHC: 33.9 g/dL (ref 30.0–36.0)
MCV: 82.8 fL (ref 78.0–100.0)
PLATELETS: 333 10*3/uL (ref 150–400)
RBC: 3.38 MIL/uL — AB (ref 4.22–5.81)
RDW: 14 % (ref 11.5–15.5)
WBC: 16.9 10*3/uL — AB (ref 4.0–10.5)

## 2015-06-26 LAB — GLUCOSE, CAPILLARY
Glucose-Capillary: 230 mg/dL — ABNORMAL HIGH (ref 65–99)
Glucose-Capillary: 243 mg/dL — ABNORMAL HIGH (ref 65–99)
Glucose-Capillary: 318 mg/dL — ABNORMAL HIGH (ref 65–99)
Glucose-Capillary: 375 mg/dL — ABNORMAL HIGH (ref 65–99)

## 2015-06-26 MED ORDER — DARBEPOETIN ALFA 100 MCG/0.5ML IJ SOSY
100.0000 ug | PREFILLED_SYRINGE | INTRAMUSCULAR | Status: DC
  Administered 2015-06-26: 100 ug via INTRAVENOUS
  Filled 2015-06-26 (×2): qty 0.5

## 2015-06-26 MED ORDER — ONDANSETRON HCL 4 MG/2ML IJ SOLN
INTRAMUSCULAR | Status: AC
  Filled 2015-06-26: qty 2

## 2015-06-26 MED ORDER — DARBEPOETIN ALFA 100 MCG/0.5ML IJ SOSY
PREFILLED_SYRINGE | INTRAMUSCULAR | Status: AC
  Filled 2015-06-26: qty 0.5

## 2015-06-26 MED ORDER — INSULIN GLARGINE 100 UNIT/ML ~~LOC~~ SOLN
15.0000 [IU] | Freq: Every day | SUBCUTANEOUS | Status: DC
  Administered 2015-06-27 – 2015-07-15 (×18): 15 [IU] via SUBCUTANEOUS
  Filled 2015-06-26 (×20): qty 0.15

## 2015-06-26 NOTE — Progress Notes (Signed)
[facility] Rounding Note  Subjective:   Seen in HD Awake, alert, in good spirits Foley removed yesterday - bladder scan last PM only 87 ml urine Says no urge to void Bowels are moving   Physical Exam:  BP 119/75 mmHg  Pulse 85  Temp(Src) 98.2 F (36.8 C) (Oral)  Resp 18  Ht 6\' 2"  (1.88 m)  Wt 93.3 kg (205 lb 11 oz)  BMI 26.40 kg/m2  SpO2 100%  GEN: NAD, elderly male lying in bed. Very pleasant.   CV: Irreg S1S2 with wide split S2 PULM: Ant clear. ABD: Mod abd distension. + BS. No focal tenderness  EXT:No edema. Foot not examined (wrapped). R IJ Temp HD cath  (12/30) currently being accessed for HD   Scheduled Meds: . antiseptic oral rinse  7 mL Mouth Rinse q12n4p  . brimonidine  1 drop Both Eyes BID  . carvedilol  6.25 mg Oral BID WC  . chlorhexidine  15 mL Mouth Rinse BID  . feeding supplement (NEPRO CARB STEADY)  237 mL Oral TID BM  . fluticasone  1 spray Each Nare Daily  . gabapentin  300 mg Oral BID  . hydrALAZINE  100 mg Oral BID  . insulin aspart  0-15 Units Subcutaneous TID WC  . insulin aspart  0-5 Units Subcutaneous QHS  . insulin aspart  4 Units Subcutaneous TID WC  . insulin glargine  10 Units Subcutaneous Daily  . latanoprost  1 drop Both Eyes QHS  . pantoprazole (PROTONIX) IV  40 mg Intravenous Q12H  . piperacillin-tazobactam (ZOSYN)  IV  2.25 g Intravenous Q8H  . rosuvastatin  20 mg Oral Daily  . sodium chloride  3 mL Intravenous Q12H       PRN Meds:.ondansetron (ZOFRAN) IV, oxyCODONE, traMADol, trazodone  LABS PENDING FROM THIS AM  Recent Labs Lab 06/23/15 0420 06/24/15 0530 06/25/15 0248  NA 137 140 135  135  K 3.9 4.3 3.7  3.7  CL 97* 100* 98*  98*  CO2 24 20* 24  24  GLUCOSE 159* 165* 289*  288*  BUN 63* 87* 56*  56*  CREATININE 6.88* 9.59* 6.60*  6.46*  CALCIUM 8.4* 8.6* 8.0*  8.1*  PHOS 5.6* 7.8* 4.3    Recent Labs Lab 06/24/15 0530 06/25/15 0248 06/26/15 0700  WBC 16.8* 17.3* 16.9*  NEUTROABS  --   13.6*  --   HGB 10.6* 9.8* 9.5*  HCT 31.6* 28.8* 28.0*  MCV 82.1 83.0 82.8  PLT 312 319 333    Background: 47M AAM with known renal insufficiency, unclear duration (creatinine 2.0 in 04/2015), mild hyperkalemia, admitted after having been down on ground for 3d with a creatinine of 4; on ACEi; with mild rhabdomyolysis; septic with diabetic foot ulcer/gangrene. Hospital course complicated by AF/RVR, GIB. Progressive AKI, oligoanuric. HD initiated 12/31 (R IJ temp cath 12/30)  ASSESSMENT/RECOMMENDATIONS  1. Anuric AKI on CKD3 (SCr 04/2015 was 2.0) (Probable diabetic nephropathy with baseline CKD3).  Rhabdo/gangrenous foot w/sepsis/vol depletion/ACE PTA-->all led to ischemic ATN.  s/p R IJ Temp HD cath 06/19/15 and started HD 12/31. Still negligible UOP. Foley out. Minimal urine. Remains HD dependent. 2. Dialysis access: R IJ temp cath (12/30) 3. Diabetic foot ulcer/gangrene of left 1st 2 toes - deferred until more stable per Dr. Sharol Given 4. Sepsis d/t #3. Vanco and zosyn 5. Partial SBO with partial small bowel malrotation.  Resolving. NG tube out. Clear liquids. Bowels moving 6. UGIB - Felt gastritis/asa/tube trauma/uremia. Had some hematemesis after NG pulled -  Hb trending down past 3 days. On protonix 7. Anemia - Fe def. Rec'd Feraheme 12/31. Add aranesp 100/week. Dose today with HD. 8. Mild rhabdomyolysis at admission - resolved 9. DM2 - ssi 10. HTN - meds 11. AFib with RVR - occurred with HD 12/31. BB for rate control  12. + trops. ? Demand ischemia. EF 50-55% 13. [place], MD [facility] [phone] Pager 06/26/2015, 8:30 AM

## 2015-06-26 NOTE — Procedures (Signed)
I have personally attended this patient's dialysis session.   R IJ temp cath accessed. 4K bath pending labs Keeping volume even No heparin Starting Aranesp 100/week.  Jamal Maes, MD [facility] [phone] Pager 06/26/2015, 8:39 AM

## 2015-06-26 NOTE — Progress Notes (Signed)
Physical Therapy Treatment Patient Details Name: David Ford MRN: [id number] DOB: 12/09/1940 Today's Date: 06/26/2015    History of Present Illness 75 yo M with hx of DM, HTN Here with generalized weakness was found to have ARF most likely due to dehydration in the setting of ACEi and was found to have untreated diabetic foot ulcer. rhabdomyolysis . ARF. SBO. Awaiting ray amputation of L foot when medically stable.     PT Comments    Modest progress today. Pt more fatigued and lethargic at times, of note, he did have dialysis today. Able to ambulate with min assist, demonstrating instability with a rolling walker. Tolerated exercises but needs frequent cues perform due to lethargy. Encouraged pt to walk with nursing staff more frequently and to get OOB as much as he can tolerate. RN notified. Patient will continue to benefit from skilled physical therapy services to further improve independence with functional mobility.   Follow Up Recommendations  SNF;Supervision/Assistance - 24 hour     Equipment Recommendations  Rolling walker with 5" wheels    Recommendations for Other Services       Precautions / Restrictions Precautions Precautions: Fall Precaution Comments: pt with necrotic L great, 1st and second toe however not medically stable for surgery.  Restrictions Weight Bearing Restrictions: No    Mobility  Bed Mobility Overal bed mobility: Needs Assistance Bed Mobility: Sit to Supine;Supine to Sit     Supine to sit: Min assist Sit to supine: Min guard   General bed mobility comments: Min assist for patient to pull self up through PTs hand. Vc for technique. Able to enter bed without assist.  Transfers Overall transfer level: Needs assistance Equipment used: Rolling walker (2 wheeled) Transfers: Sit to/from Stand Sit to Stand: Min guard         General transfer comment: Close guard for safety. Slow to rise but stable once upright. VC for hand placement. Practiced 3  times from lowest bed setting.  Ambulation/Gait Ambulation/Gait assistance: Min assist Ambulation Distance (Feet): 25 Feet Assistive device: Rolling walker (2 wheeled) Gait Pattern/deviations: Step-through pattern;Decreased stride length;Staggering left;Staggering right;Drifts right/left;Trunk flexed Gait velocity: decreased Gait velocity interpretation: Below normal speed for age/gender General Gait Details: Drifting at times, occasional stagger. Required min assist to correct balance. VC for walker placement and upright posture.   Stairs            Wheelchair Mobility    Modified Rankin (Stroke Patients Only)       Balance                                    Cognition Arousal/Alertness: Lethargic Behavior During Therapy: Flat affect Overall Cognitive Status: Impaired/Different from baseline Area of Impairment: Following commands;Problem solving;Memory     Memory: Decreased short-term memory Following Commands: Follows one step commands inconsistently;Follows one step commands with increased time     Problem Solving: Slow processing;Decreased initiation;Difficulty sequencing;Requires verbal cues;Requires tactile cues      Exercises General Exercises - Lower Extremity Ankle Circles/Pumps: AROM;Both;10 reps;Supine Long Arc Quad: Both;10 reps;Seated;Strengthening Straight Leg Raises: Strengthening;Both;10 reps;Supine Hip Flexion/Marching: Strengthening;Both;10 reps;Seated    General Comments General comments (skin integrity, edema, etc.): SpO2 98% on room air. 3/4 dyspnea with limited exertion.       Pertinent Vitals/Pain Pain Assessment: No/denies pain    Home Living  Prior Function            PT Goals (current goals can now be found in the care plan section) Acute Rehab PT Goals Patient Stated Goal: get better PT Goal Formulation: With patient/family Time For Goal Achievement: 07/02/15 Potential to Achieve  Goals: Good Progress towards PT goals: Progressing toward goals    Frequency  Min 3X/week    PT Plan Current plan remains appropriate    Co-evaluation             End of Session Equipment Utilized During Treatment: Gait belt Activity Tolerance: Patient limited by fatigue Patient left: with call bell/phone within reach;with SCD's reapplied;in bed;with bed alarm set     Time: [id number] PT Time Calculation (min) (ACUTE ONLY): 17 min  Charges:  $Therapeutic Exercise: 8-22 mins                    G Codes:      [name] 2015-06-28, 6:47 PM Camille Bal Weir, [place]

## 2015-06-26 NOTE — Progress Notes (Signed)
Patient ID: David Ford, male   DOB: 18-May-1941, 75 y.o.   MRN: [id number]    Subjective: Pt in HD.  Feels ok.  Ate some jello, fruit cocktail, and juice this morning.  Passing flatus  Objective: Vital signs in last 24 hours: Temp:  [97.9 F (36.6 C)-99 F (37.2 C)] 98.2 F (36.8 C) (01/06 0818) Pulse Rate:  [84-100] 92 (01/06 1000) Resp:  [18-27] 20 (01/06 1000) BP: (107-158)/(56-79) 115/56 mmHg (01/06 1000) SpO2:  [97 %-100 %] 100 % (01/06 0818) Weight:  [93.3 kg (205 lb 11 oz)] 93.3 kg (205 lb 11 oz) (01/06 0818) Last BM Date: 06/25/15  Intake/Output from previous day: 01/05 0701 - 01/06 0700 In: 510 [P.O.:360; IV Piggyback:150] Out: -  Intake/Output this shift:    PE: Abd: soft, some bloating, +BS, NT  Lab Results:   Recent Labs  06/25/15 0248 06/26/15 0700  WBC 17.3* 16.9*  HGB 9.8* 9.5*  HCT 28.8* 28.0*  PLT 319 333   BMET  Recent Labs  06/25/15 0248 06/26/15 0700  NA 135  135 129*  K 3.7  3.7 3.7  CL 98*  98* 94*  CO2 24  24 21*  GLUCOSE 289*  288* 243*  BUN 56*  56* 69*  CREATININE 6.60*  6.46* 8.25*  CALCIUM 8.0*  8.1* 7.8*   PT/INR No results for input(s): LABPROT, INR in the last 72 hours. CMP     Component Value Date/Time   NA 129* 06/26/2015 0700   K 3.7 06/26/2015 0700   CL 94* 06/26/2015 0700   CO2 21* 06/26/2015 0700   GLUCOSE 243* 06/26/2015 0700   BUN 69* 06/26/2015 0700   CREATININE 8.25* 06/26/2015 0700   CALCIUM 7.8* 06/26/2015 0700   PROT 5.8* 06/25/2015 0248   ALBUMIN 1.8* 06/26/2015 0700   AST 18 06/25/2015 0248   ALT 18 06/25/2015 0248   ALKPHOS 81 06/25/2015 0248   BILITOT 0.9 06/25/2015 0248   GFRNONAA 6* 06/26/2015 0700   GFRAA 7* 06/26/2015 0700   Lipase  No results found for: LIPASE     Studies/Results: No results found.  Anti-infectives: Anti-infectives    Start     Dose/Rate Route Frequency Ordered Stop   06/23/15 0100  piperacillin-tazobactam (ZOSYN) IVPB 2.25 g     2.25 g 100 mL/hr over  30 Minutes Intravenous Every 8 hours 06/22/15 1912     06/22/15 1600  vancomycin (VANCOCIN) IVPB 1000 mg/200 mL premix     1,000 mg 200 mL/hr over 60 Minutes Intravenous  Once 06/22/15 1448 06/22/15 1730   06/20/15 1400  vancomycin (VANCOCIN) IVPB 1000 mg/200 mL premix     1,000 mg 200 mL/hr over 60 Minutes Intravenous  Once 06/20/15 1205 06/20/15 1538   06/19/15 2100  vancomycin (VANCOCIN) IVPB 1000 mg/200 mL premix  Status:  Discontinued     1,000 mg 200 mL/hr over 60 Minutes Intravenous Every 48 hours 06/17/15 2214 06/18/15 0945   06/18/15 1200  vancomycin (VANCOCIN) IVPB 1000 mg/200 mL premix  Status:  Discontinued     1,000 mg 200 mL/hr over 60 Minutes Intravenous Every 48 hours 06/18/15 0945 06/20/15 1204   06/18/15 0530  piperacillin-tazobactam (ZOSYN) IVPB 2.25 g  Status:  Discontinued     2.25 g 100 mL/hr over 30 Minutes Intravenous Every 8 hours 06/17/15 2214 06/22/15 1912   06/17/15 2115  piperacillin-tazobactam (ZOSYN) IVPB 3.375 g     3.375 g 100 mL/hr over 30 Minutes Intravenous  Once 06/17/15 2111 06/17/15 2151  06/17/15 2115  vancomycin (VANCOCIN) IVPB 1000 mg/200 mL premix     1,000 mg 200 mL/hr over 60 Minutes Intravenous  Once 06/17/15 2111 06/18/15 0235       Assessment/Plan  1. SBO, resolving -already on a solid diet.  No nausea and seems to be tolerating this well.  No plans for surgical intervention.  We will sign off.   LOS: 9 days    Santia Labate E 06/26/2015, 10:09 AM Pager: [phone]

## 2015-06-26 NOTE — Clinical Social Work Placement (Signed)
   CLINICAL SOCIAL WORK PLACEMENT  NOTE 06/26/15 - Patient provided with Facility Responses.  Date:  06/26/2015  Patient Details  Name: David Ford MRN: [id number] Date of Birth: [birth year]  Clinical Social Work is seeking post-discharge placement for this patient at the [place] level of care (*CSW will initial, date and re-position this form in  chart as items are completed):  Yes   Patient/family provided with [place] Work Department's list of facilities offering this level of care within the geographic area requested by the patient (or if unable, by the patient's family).  Yes   Patient/family informed of their freedom to choose among providers that offer the needed level of care, that participate in Medicare, Medicaid or managed care program needed by the patient, have an available bed and are willing to accept the patient.  Yes   Patient/family informed of [facility]'s ownership interest in [facility] and [facility], as well as of the fact that they are under no obligation to receive care at these facilities.  PASRR submitted to EDS on 06/19/15     PASRR number received on 06/19/15     Existing PASRR number confirmed on       FL2 transmitted to all facilities in geographic area requested by pt/family on 06/20/15     FL2 transmitted to all facilities within larger geographic area on       Patient informed that his/her managed care company has contracts with or will negotiate with certain facilities, including the following:         06/26/15 - Patient/family informed of bed offers received.  Patient chooses bed at       Physician recommends and patient chooses bed at      Patient to be transferred to   on  .  Patient to be transferred to facility by       Patient family notified on   of transfer.  Name of family member notified:        PHYSICIAN       Additional Comment:     _______________________________________________ Sable Feil, LCSW 06/26/2015, 3:01 PM

## 2015-06-26 NOTE — Progress Notes (Signed)
[facility] Progress Note  Patient: David Ford [id number]   PCP: No primary care provider on file. DOB: 1941/01/09   DOA: 06/17/2015   DOS: 06/26/2015   Date of Service: the patient was seen and examined on 06/26/2015  Subjective: no further bleeding. No other complains.  Nutrition: Able to tolerate solids. No BM Activity: Out of bed to chair Last BM: 06/24/2015  Assessment and Plan: 1. Diabetic foot infection ([place]) Sepsis, most likely secondary to cellulitis with diabetes. No blood culture on admission, MRSA screen negative,  Orthopedic consulted again, awaiting recommendation.  Continue Zosyn.  2. Small bowel obstruction partial. General surgery consulted, currently diet is advance to solids.  3. Upper GI bleed. One episode of hemoptysis, probable gastritis, no further episode. H&H remains stable. GI consulted initially and thought to be secondary to trauma from NG tube insertion.  4. Anuric acute on chronic kidney disease stage III. Possible diabetic nephropathy. Nephrology following with the patient. Foley catheter discontinued, no urine output, will check bladder scan. Appreciate input from nephrology. Continue acute hemodialysis as needed per nephrology.  5. Elevated troponin. Echocardiogram 50-55% EF, no wall motion abnormality, grade 1 diastolic dysfunction. As per earlier discussion with cardiology patient will need outpatient workup. It was thought most likely secondary to renal failure as well as demand ischemia.  6. Atrial fibrillation. Paroxysmal, currently normal sinus rhythm spontaneously [place] Vasc score elevated. Will need anticoagulation, currently holding in the setting of gastritis with probable upper GI bleed. Also awaiting recommendations from orthopedics regarding procedure. started on heparin earlier.  7. Bilateral shoulder pain. X-ray shoulder no fracture. May need MRI as an outpatient.  8. Essential hypertension. Resume oral  medication.  9. Type 2 diabetes mellitus, uncontrolled Hemoglobin A1c 14.2 December 16. Moderate sliding scale, 4 units AC coverage and 15 units lantus.  DVT Prophylaxis: subcutaneous Heparin Nutrition: Full liquid diet Advance goals of care discussion: Full code  Brief Summary of Hospitalization:  HPI: As per the H and P dictated on admission, "David Ford is a 75 y.o. male   has a past medical history of Diabetes mellitus without complication ([place]) and Hypertension.   Presented with a fall onto the floor from the couch. He has communicated that has been down on the floor for past 3 days. He was not able to walk but could crawl and states could reach fridge and microwave so he ate and drank. He reports he was able able to call on the phone the entire time but did not request any help until today when he realized he was not getting any better. He has been living in [place] for past 20 years but has a PCP in [place]. He reports flying there every 3 months.  Patient lives at home alone he was found today by the friend who called EMS. Patient has history of diabetes but his glucose meter has been broken. Patient has been fatigued. Denies any loss of consciousness patient reports some file order from his toes. Reports generalized muscle aches. Reports loss of strength in arms and legs. Reports he was able to urinate today.  He is unsure if he has any history of kidney disease. Unsure if he's been taking his medications Emergency department he was found to have evidence of rhabdomyolysis with CKs 3053, his initial lab work showing creatinine of 4 potassium was 6.0 and an gap was 16. Patient was unable to produce urine. Troponin slightly elevated to 0.05 albumin 2.9 White blood cell, was noted to be elevated 16.2  Hospitalist was called for admission for dehydration rhabdomyolysis evidence of acute renal failure" Procedures: Echocardiogram 06/18/2015 50-55% ejection fraction. Hemodialysis  catheter insertion 06/19/2015 Consultants: Nephrology, gastroenterology, general surgery, orthopedic surgery Dr. Sharol Given, critical care for dialysis catheter placement. Antibiotics: Anti-infectives    Start     Dose/Rate Route Frequency Ordered Stop   06/23/15 0100  piperacillin-tazobactam (ZOSYN) IVPB 2.25 g     2.25 g 100 mL/hr over 30 Minutes Intravenous Every 8 hours 06/22/15 1912     06/22/15 1600  vancomycin (VANCOCIN) IVPB 1000 mg/200 mL premix     1,000 mg 200 mL/hr over 60 Minutes Intravenous  Once 06/22/15 1448 06/22/15 1730   06/20/15 1400  vancomycin (VANCOCIN) IVPB 1000 mg/200 mL premix     1,000 mg 200 mL/hr over 60 Minutes Intravenous  Once 06/20/15 1205 06/20/15 1538   06/19/15 2100  vancomycin (VANCOCIN) IVPB 1000 mg/200 mL premix  Status:  Discontinued     1,000 mg 200 mL/hr over 60 Minutes Intravenous Every 48 hours 06/17/15 2214 06/18/15 0945   06/18/15 1200  vancomycin (VANCOCIN) IVPB 1000 mg/200 mL premix  Status:  Discontinued     1,000 mg 200 mL/hr over 60 Minutes Intravenous Every 48 hours 06/18/15 0945 06/20/15 1204   06/18/15 0530  piperacillin-tazobactam (ZOSYN) IVPB 2.25 g  Status:  Discontinued     2.25 g 100 mL/hr over 30 Minutes Intravenous Every 8 hours 06/17/15 2214 06/22/15 1912   06/17/15 2115  piperacillin-tazobactam (ZOSYN) IVPB 3.375 g     3.375 g 100 mL/hr over 30 Minutes Intravenous  Once 06/17/15 2111 06/17/15 2151   06/17/15 2115  vancomycin (VANCOCIN) IVPB 1000 mg/200 mL premix     1,000 mg 200 mL/hr over 60 Minutes Intravenous  Once 06/17/15 2111 06/18/15 0235      Family Communication: no family was present at bedside, at the time of interview.   Disposition: Transferred out of the stepdown unit today. Barriers to safe discharge: Improvement in renal function,   Intake/Output Summary (Last 24 hours) at 06/26/15 1758 Last data filed at 06/26/15 1228  Gross per 24 hour  Intake    460 ml  Output    158 ml  Net    302 ml   Filed  Weights   06/24/15 1050 06/26/15 0818 06/26/15 1228  Weight: 95.5 kg (210 lb 8.6 oz) 93.3 kg (205 lb 11 oz) 92.8 kg (204 lb 9.4 oz)    Objective: Physical Exam: Filed Vitals:   06/26/15 1100 06/26/15 1127 06/26/15 1228 06/26/15 1528  BP: 138/70 140/76 134/69 111/62  Pulse: 88 89 86 97  Temp:   97.9 F (36.6 C) 100 F (37.8 C)  TempSrc:   Oral Oral  Resp: 23 18 20 19   Height:      Weight:   92.8 kg (204 lb 9.4 oz)   SpO2:   100% 98%    General: Appear in mild distress, no Rash; Oral Mucosa moist. Cardiovascular: S1 and S2 Present, no Murmur,  Respiratory: Bilateral Air entry present and Clear to Auscultation, no Crackles, no wheezes Abdomen: Bowel Sound present, Soft and no tenderness Extremities: no Pedal edema, no calf tenderness  Data Reviewed: CBC:  Recent Labs Lab 06/22/15 0530 06/23/15 0420 06/24/15 0530 06/25/15 0248 06/26/15 0700  WBC 19.0* 18.9* 16.8* 17.3* 16.9*  NEUTROABS  --   --   --  13.6*  --   HGB 11.2* 9.8* 10.6* 9.8* 9.5*  HCT 32.8* 29.2* 31.6* 28.8* 28.0*  MCV 80.4 80.9 82.1  83.0 82.8  PLT 292 300 312 319 0000000   Basic Metabolic Panel:  Recent Labs Lab 06/22/15 0530 06/23/15 0420 06/24/15 0530 06/25/15 0248 06/26/15 0700  NA 137 137 140 135  135 129*  K 4.3 3.9 4.3 3.7  3.7 3.7  CL 97* 97* 100* 98*  98* 94*  CO2 22 24 20* 24  24 21*  GLUCOSE 113* 159* 165* 289*  288* 243*  BUN 103* 63* 87* 56*  56* 69*  CREATININE 9.94* 6.88* 9.59* 6.60*  6.46* 8.25*  CALCIUM 8.4* 8.4* 8.6* 8.0*  8.1* 7.8*  PHOS 6.7* 5.6* 7.8* 4.3 5.2*   Liver Function Tests:  Recent Labs Lab 06/22/15 0530 06/23/15 0420 06/24/15 0530 06/25/15 0248 06/26/15 0700  AST  --   --   --  18  --   ALT  --   --   --  18  --   ALKPHOS  --   --   --  81  --   BILITOT  --   --   --  0.9  --   PROT  --   --   --  5.8*  --   ALBUMIN 1.9* 1.8* 1.8* 1.6*  1.7* 1.8*   No results for input(s): LIPASE, AMYLASE in the last 168 hours.  Recent Labs Lab 06/20/15 0421   AMMONIA 21    Cardiac Enzymes:  Recent Labs Lab 06/19/15 2322 06/20/15 0423 06/20/15 1730 06/21/15 0201  TROPONINI 0.07* 0.06* 0.06* 0.20*    BNP (last 3 results) No results for input(s): BNP in the last 8760 hours.  CBG:  Recent Labs Lab 06/25/15 1723 06/25/15 2124 06/26/15 0623 06/26/15 0802 06/26/15 1627  GLUCAP 332* 280* 230* 243* 318*    Recent Results (from the past 240 hour(s))  Urine culture     Status: None   Collection Time: 06/18/15  1:18 AM  Result Value Ref Range Status   Specimen Description URINE, CATHETERIZED  Final   Special Requests NONE  Final   Culture NO GROWTH 1 DAY  Final   Report Status 06/19/2015 FINAL  Final  Surgical pcr screen     Status: None   Collection Time: 06/19/15  5:25 AM  Result Value Ref Range Status   MRSA, PCR NEGATIVE NEGATIVE Final   Staphylococcus aureus NEGATIVE NEGATIVE Final    Comment:        The Xpert SA Assay (FDA approved for NASAL specimens in patients over 24 years of age), is one component of a comprehensive surveillance program.  Test performance has been validated by [facility] for patients greater than or equal to 59 year old. It is not intended to diagnose infection nor to guide or monitor treatment.      Studies: No results found.   Scheduled Meds: . antiseptic oral rinse  7 mL Mouth Rinse q12n4p  . brimonidine  1 drop Both Eyes BID  . carvedilol  6.25 mg Oral BID WC  . chlorhexidine  15 mL Mouth Rinse BID  . Darbepoetin Alfa      . darbepoetin (ARANESP) injection - DIALYSIS  100 mcg Intravenous Q Fri-HD  . feeding supplement (NEPRO CARB STEADY)  237 mL Oral TID BM  . fluticasone  1 spray Each Nare Daily  . gabapentin  300 mg Oral BID  . hydrALAZINE  100 mg Oral BID  . insulin aspart  0-15 Units Subcutaneous TID WC  . insulin aspart  0-5 Units Subcutaneous QHS  . insulin aspart  4  Units Subcutaneous TID WC  . [START ON 06/27/2015] insulin glargine  15 Units Subcutaneous Daily  .  latanoprost  1 drop Both Eyes QHS  . pantoprazole (PROTONIX) IV  40 mg Intravenous Q12H  . piperacillin-tazobactam (ZOSYN)  IV  2.25 g Intravenous Q8H  . rosuvastatin  20 mg Oral Daily  . sodium chloride  3 mL Intravenous Q12H   Continuous Infusions:  PRN Meds: ondansetron (ZOFRAN) IV, oxyCODONE, traMADol, trazodone  Time spent: 30 minutes  Author: Berle Mull, MD [facility] Pager: [phone] 06/26/2015 5:58 PM  If 7PM-7AM, please contact night-coverage at [email or web address], password [facility]

## 2015-06-26 NOTE — Progress Notes (Signed)
Pt catheter was removed before i came in at 7 pm pt was not able to pee the whole of the twelve hours shift bladder scan done retaining amount of urine is 87 ml will continue to monitor

## 2015-06-27 LAB — RENAL FUNCTION PANEL
ALBUMIN: 1.7 g/dL — AB (ref 3.5–5.0)
Anion gap: 12 (ref 5–15)
BUN: 39 mg/dL — AB (ref 6–20)
CO2: 22 mmol/L (ref 22–32)
Calcium: 7.4 mg/dL — ABNORMAL LOW (ref 8.9–10.3)
Chloride: 98 mmol/L — ABNORMAL LOW (ref 101–111)
Creatinine, Ser: 6.04 mg/dL — ABNORMAL HIGH (ref 0.61–1.24)
GFR calc Af Amer: 10 mL/min — ABNORMAL LOW (ref >60)
GFR calc non Af Amer: 8 mL/min — ABNORMAL LOW (ref >60)
GLUCOSE: 254 mg/dL — AB (ref 65–99)
PHOSPHORUS: 4.4 mg/dL (ref 2.5–4.6)
POTASSIUM: 3.7 mmol/L (ref 3.5–5.1)
SODIUM: 132 mmol/L — AB (ref 135–145)

## 2015-06-27 LAB — GLUCOSE, CAPILLARY
GLUCOSE-CAPILLARY: 251 mg/dL — AB (ref 65–99)
GLUCOSE-CAPILLARY: 203 mg/dL — AB (ref 65–99)
Glucose-Capillary: 255 mg/dL — ABNORMAL HIGH (ref 65–99)
Glucose-Capillary: 337 mg/dL — ABNORMAL HIGH (ref 65–99)

## 2015-06-27 LAB — CBC
HEMATOCRIT: 24.4 % — AB (ref 39.0–52.0)
Hemoglobin: 8 g/dL — ABNORMAL LOW (ref 13.0–17.0)
MCH: 27.4 pg (ref 26.0–34.0)
MCHC: 32.8 g/dL (ref 30.0–36.0)
MCV: 83.6 fL (ref 78.0–100.0)
Platelets: 249 10*3/uL (ref 150–400)
RBC: 2.92 MIL/uL — ABNORMAL LOW (ref 4.22–5.81)
RDW: 14.1 % (ref 11.5–15.5)
WBC: 15.3 10*3/uL — ABNORMAL HIGH (ref 4.0–10.5)

## 2015-06-27 MED ORDER — INSULIN ASPART 100 UNIT/ML ~~LOC~~ SOLN
8.0000 [IU] | Freq: Three times a day (TID) | SUBCUTANEOUS | Status: DC
  Administered 2015-06-27 – 2015-06-28 (×5): 8 [IU] via SUBCUTANEOUS

## 2015-06-27 MED ORDER — GUAIFENESIN 100 MG/5ML PO SOLN
5.0000 mL | ORAL | Status: DC | PRN
  Filled 2015-06-27: qty 5

## 2015-06-27 NOTE — Progress Notes (Signed)
[facility] Rounding Note  Subjective:   Last HD 1/6 No urine output No complaints  Physical Exam:  BP 130/60 mmHg  Pulse 85  Temp(Src) 99.2 F (37.3 C) (Oral)  Resp 20  Ht 6\' 2"  (1.88 m)  Wt 92.6 kg (204 lb 2.3 oz)  BMI 26.20 kg/m2  SpO2 98%  GEN: NAD, elderly male lying in bed. Awakened him from sleep CV: Irreg S1S2 with wide split S2 PULM: Ant clear. ABD: Mod abd distension. + BS. No focal tenderness  EXT:No edema. Foot not examined (wrapped). R IJ Temp HD cath  (12/30)   Scheduled Meds: . antiseptic oral rinse  7 mL Mouth Rinse q12n4p  . brimonidine  1 drop Both Eyes BID  . carvedilol  6.25 mg Oral BID WC  . chlorhexidine  15 mL Mouth Rinse BID  . darbepoetin (ARANESP) injection - DIALYSIS  100 mcg Intravenous Q Fri-HD  . feeding supplement (NEPRO CARB STEADY)  237 mL Oral TID BM  . fluticasone  1 spray Each Nare Daily  . gabapentin  300 mg Oral BID  . hydrALAZINE  100 mg Oral BID  . insulin aspart  0-15 Units Subcutaneous TID WC  . insulin aspart  0-5 Units Subcutaneous QHS  . insulin aspart  4 Units Subcutaneous TID WC  . insulin glargine  15 Units Subcutaneous Daily  . latanoprost  1 drop Both Eyes QHS  . pantoprazole (PROTONIX) IV  40 mg Intravenous Q12H  . piperacillin-tazobactam (ZOSYN)  IV  2.25 g Intravenous Q8H  . rosuvastatin  20 mg Oral Daily  . sodium chloride  3 mL Intravenous Q12H       PRN Meds:.ondansetron (ZOFRAN) IV, oxyCODONE, traMADol, trazodone  LABS PENDING FROM THIS AM  Recent Labs Lab 06/25/15 0248 06/26/15 0700 06/27/15 0531  NA 135  135 129* 132*  K 3.7  3.7 3.7 3.7  CL 98*  98* 94* 98*  CO2 24  24 21* 22  GLUCOSE 289*  288* 243* 254*  BUN 56*  56* 69* 39*  CREATININE 6.60*  6.46* 8.25* 6.04*  CALCIUM 8.0*  8.1* 7.8* 7.4*  PHOS 4.3 5.2* 4.4    Recent Labs Lab 06/25/15 0248 06/26/15 0700 06/27/15 0531  WBC 17.3* 16.9* 15.3*  NEUTROABS 13.6*  --   --   HGB 9.8* 9.5* 8.0*  HCT 28.8* 28.0*  24.4*  MCV 83.0 82.8 83.6  PLT 319 333 249    Background: 39M AAM with known renal insufficiency, unclear duration (creatinine 2.0 in 04/2015), mild hyperkalemia, admitted after having been down on ground for 3d with a creatinine of 4; on ACEi; with mild rhabdomyolysis; septic with diabetic foot ulcer/gangrene. Hospital course complicated by AF/RVR, GIB. Progressive AKI, oligoanuric. HD initiated 12/31 (R IJ temp cath 12/30)  ASSESSMENT/RECOMMENDATIONS  1. Anuric AKI on CKD3 (SCr 04/2015 was 2.0) (Probable diabetic nephropathy with baseline CKD3).  Rhabdo/gangrenous foot w/sepsis/vol depletion/ACE PTA-->all led to ischemic ATN.  s/p R IJ Temp HD cath 06/19/15 and started HD 12/31. Still negligible UOP. Foley out. Prn bladder scans. Minimal urine. Remains HD dependent. Anticipate next HD Monday 2. Dialysis access: R IJ temp cath (12/30) 3. Diabetic foot ulcer/gangrene of left 1st 2 toes - deferred until more stable per Dr. Sharol Given - he has been reconsulted? 4. Sepsis d/t #3. Vanco and zosyn 5. Partial SBO with partial small bowel malrotation.  Resolving. NG tube out.  Bowels moving. Tolerating diet. Surgery has signed off. 6. UGIB - Felt gastritis/asa/tube trauma/uremia. Had some  hematemesis after NG pulled - Hb trending down past 4 days. On protonix. 7. Anemia - Fe def. Rec'd Feraheme 12/31. Added aranesp 100/week. Dosed 1/6. Hb down to 8.  8. DM2 - ssi 9. HTN - meds 10. AFib with RVR - occurred with HD 12/31. BB for rate control  11. + trops. ? Demand ischemia. EF 50-55% 12. [place], MD [facility] [phone] Pager 06/27/2015, 12:29 PM

## 2015-06-27 NOTE — Progress Notes (Signed)
[facility] Progress Note  Patient: David Ford [id number]   PCP: No primary care provider on file. DOB: [birth year]   DOA: 06/17/2015   DOS: 06/27/2015   Date of Service: the patient was seen and examined on 06/27/2015  Subjective: No significant urine output. No abdominal pain no known vomiting..  Nutrition: Able to tolerate solids. No BM Activity: Out of bed to chair Last BM: 06/24/2015  Assessment and Plan: 1. Diabetic foot infection ([place]) Sepsis, most likely secondary to cellulitis with diabetes. No blood culture on admission, MRSA screen negative,  Orthopedic consulted again, awaiting recommendation.  Continue Zosyn. We will complete a total 14 days of treatment on 07/01/2015  2. Small bowel obstruction partial. Tolerating oral diet, no further bowel movement but no further vomiting as well. Abdomen is not distended.  3. Upper GI bleed. One episode of hemoptysis, probable gastritis, no further episode. H&H remains stable. GI consulted initially and thought to be secondary to trauma from NG tube insertion.  4. Anuric acute on chronic kidney disease stage III. Possible diabetic nephropathy. Nephrology following with the patient. Foley catheter discontinued, no urine output, will check bladder scan. Appreciate input from nephrology. Continue acute hemodialysis as needed per nephrology.  5. Elevated troponin. Echocardiogram 50-55% EF, no wall motion abnormality, grade 1 diastolic dysfunction. As per earlier discussion with cardiology patient will need outpatient workup. It was thought most likely secondary to renal failure as well as demand ischemia.  6. Atrial fibrillation. Paroxysmal, currently normal sinus rhythm spontaneously [place] Vasc score elevated. Will need anticoagulation, currently holding in the setting of gastritis with probable upper GI bleed. Also awaiting recommendations from orthopedics regarding procedure. started on heparin earlier.  7. Bilateral  shoulder pain. Currently have resolved. He may need MRI if recurrence as an outpatient  8. Essential hypertension. Resume oral medication.  9. Type 2 diabetes mellitus, uncontrolled Hemoglobin A1c 14.2 December 16. Moderate sliding scale, 4 units AC coverage and 15 units lantus.  DVT Prophylaxis: subcutaneous Heparin Nutrition: Full liquid diet Advance goals of care discussion: Full code  Brief Summary of Hospitalization:  HPI: As per the H and P dictated on admission, "David Ford is a 75 y.o. male   has a past medical history of Diabetes mellitus without complication ([place]) and Hypertension.   Presented with a fall onto the floor from the couch. He has communicated that has been down on the floor for past 3 days. He was not able to walk but could crawl and states could reach fridge and microwave so he ate and drank. He reports he was able able to call on the phone the entire time but did not request any help until today when he realized he was not getting any better. He has been living in [place] for past 20 years but has a PCP in [place]. He reports flying there every 3 months.  Patient lives at home alone he was found today by the friend who called EMS. Patient has history of diabetes but his glucose meter has been broken. Patient has been fatigued. Denies any loss of consciousness patient reports some file order from his toes. Reports generalized muscle aches. Reports loss of strength in arms and legs. Reports he was able to urinate today.  He is unsure if he has any history of kidney disease. Unsure if he's been taking his medications Emergency department he was found to have evidence of rhabdomyolysis with CKs 3053, his initial lab work showing creatinine of 4 potassium was 6.0 and an  gap was 16. Patient was unable to produce urine. Troponin slightly elevated to 0.05 albumin 2.9 White blood cell, was noted to be elevated 16.2 Hospitalist was called for admission for  dehydration rhabdomyolysis evidence of acute renal failure" Procedures: Echocardiogram 06/18/2015 50-55% ejection fraction. Hemodialysis catheter insertion 06/19/2015 Consultants: Nephrology, gastroenterology, general surgery, orthopedic surgery Dr. Sharol Given, critical care for dialysis catheter placement. Antibiotics: Anti-infectives    Start     Dose/Rate Route Frequency Ordered Stop   06/23/15 0100  piperacillin-tazobactam (ZOSYN) IVPB 2.25 g     2.25 g 100 mL/hr over 30 Minutes Intravenous Every 8 hours 06/22/15 1912     06/22/15 1600  vancomycin (VANCOCIN) IVPB 1000 mg/200 mL premix     1,000 mg 200 mL/hr over 60 Minutes Intravenous  Once 06/22/15 1448 06/22/15 1730   06/20/15 1400  vancomycin (VANCOCIN) IVPB 1000 mg/200 mL premix     1,000 mg 200 mL/hr over 60 Minutes Intravenous  Once 06/20/15 1205 06/20/15 1538   06/19/15 2100  vancomycin (VANCOCIN) IVPB 1000 mg/200 mL premix  Status:  Discontinued     1,000 mg 200 mL/hr over 60 Minutes Intravenous Every 48 hours 06/17/15 2214 06/18/15 0945   06/18/15 1200  vancomycin (VANCOCIN) IVPB 1000 mg/200 mL premix  Status:  Discontinued     1,000 mg 200 mL/hr over 60 Minutes Intravenous Every 48 hours 06/18/15 0945 06/20/15 1204   06/18/15 0530  piperacillin-tazobactam (ZOSYN) IVPB 2.25 g  Status:  Discontinued     2.25 g 100 mL/hr over 30 Minutes Intravenous Every 8 hours 06/17/15 2214 06/22/15 1912   06/17/15 2115  piperacillin-tazobactam (ZOSYN) IVPB 3.375 g     3.375 g 100 mL/hr over 30 Minutes Intravenous  Once 06/17/15 2111 06/17/15 2151   06/17/15 2115  vancomycin (VANCOCIN) IVPB 1000 mg/200 mL premix     1,000 mg 200 mL/hr over 60 Minutes Intravenous  Once 06/17/15 2111 06/18/15 0235      Family Communication: no family was present at bedside, at the time of interview.   Disposition: Barriers to safe discharge: Improvement in renal function, further workup regarding diabetic foot infection   Intake/Output Summary (Last 24  hours) at 06/27/15 1710 Last data filed at 06/27/15 0940  Gross per 24 hour  Intake    700 ml  Output      1 ml  Net    699 ml   Filed Weights   06/26/15 0818 06/26/15 1228 06/26/15 2103  Weight: 93.3 kg (205 lb 11 oz) 92.8 kg (204 lb 9.4 oz) 92.6 kg (204 lb 2.3 oz)    Objective: Physical Exam: Filed Vitals:   06/26/15 1528 06/26/15 2103 06/27/15 0614 06/27/15 0914  BP: 111/62 116/53 127/57 130/60  Pulse: 97 91 83 85  Temp: 100 F (37.8 C) 99.8 F (37.7 C) 99.6 F (37.6 C) 99.2 F (37.3 C)  TempSrc: Oral Oral Oral Oral  Resp: 19 18 19 20   Height:      Weight:  92.6 kg (204 lb 2.3 oz)    SpO2: 98% 99% 98% 98%    General: Appear in mild distress, no Rash; Oral Mucosa moist. Cardiovascular: S1 and S2 Present, no Murmur,  Respiratory: Bilateral Air entry present and Clear to Auscultation, no Crackles, no wheezes Abdomen: Bowel Sound present, Soft and no tenderness Extremities: no Pedal edema, no calf tenderness  Data Reviewed: CBC:  Recent Labs Lab 06/23/15 0420 06/24/15 0530 06/25/15 0248 06/26/15 0700 06/27/15 0531  WBC 18.9* 16.8* 17.3* 16.9* 15.3*  NEUTROABS  --   --  13.6*  --   --   HGB 9.8* 10.6* 9.8* 9.5* 8.0*  HCT 29.2* 31.6* 28.8* 28.0* 24.4*  MCV 80.9 82.1 83.0 82.8 83.6  PLT 300 312 319 333 0000000   Basic Metabolic Panel:  Recent Labs Lab 06/23/15 0420 06/24/15 0530 06/25/15 0248 06/26/15 0700 06/27/15 0531  NA 137 140 135  135 129* 132*  K 3.9 4.3 3.7  3.7 3.7 3.7  CL 97* 100* 98*  98* 94* 98*  CO2 24 20* 24  24 21* 22  GLUCOSE 159* 165* 289*  288* 243* 254*  BUN 63* 87* 56*  56* 69* 39*  CREATININE 6.88* 9.59* 6.60*  6.46* 8.25* 6.04*  CALCIUM 8.4* 8.6* 8.0*  8.1* 7.8* 7.4*  PHOS 5.6* 7.8* 4.3 5.2* 4.4   Liver Function Tests:  Recent Labs Lab 06/23/15 0420 06/24/15 0530 06/25/15 0248 06/26/15 0700 06/27/15 0531  AST  --   --  18  --   --   ALT  --   --  18  --   --   ALKPHOS  --   --  81  --   --   BILITOT  --   --  0.9   --   --   PROT  --   --  5.8*  --   --   ALBUMIN 1.8* 1.8* 1.6*  1.7* 1.8* 1.7*   No results for input(s): LIPASE, AMYLASE in the last 168 hours. No results for input(s): AMMONIA in the last 168 hours.  Cardiac Enzymes:  Recent Labs Lab 06/20/15 1730 06/21/15 0201  TROPONINI 0.06* 0.20*    BNP (last 3 results) No results for input(s): BNP in the last 8760 hours.  CBG:  Recent Labs Lab 06/26/15 0802 06/26/15 1627 06/26/15 2101 06/27/15 0745 06/27/15 1233  GLUCAP 243* 318* 375* 255* 337*    Recent Results (from the past 240 hour(s))  Urine culture     Status: None   Collection Time: 06/18/15  1:18 AM  Result Value Ref Range Status   Specimen Description URINE, CATHETERIZED  Final   Special Requests NONE  Final   Culture NO GROWTH 1 DAY  Final   Report Status 06/19/2015 FINAL  Final  Surgical pcr screen     Status: None   Collection Time: 06/19/15  5:25 AM  Result Value Ref Range Status   MRSA, PCR NEGATIVE NEGATIVE Final   Staphylococcus aureus NEGATIVE NEGATIVE Final    Comment:        The Xpert SA Assay (FDA approved for NASAL specimens in patients over 63 years of age), is one component of a comprehensive surveillance program.  Test performance has been validated by [facility] for patients greater than or equal to 7 year old. It is not intended to diagnose infection nor to guide or monitor treatment.      Studies: No results found.   Scheduled Meds: . antiseptic oral rinse  7 mL Mouth Rinse q12n4p  . brimonidine  1 drop Both Eyes BID  . carvedilol  6.25 mg Oral BID WC  . chlorhexidine  15 mL Mouth Rinse BID  . darbepoetin (ARANESP) injection - DIALYSIS  100 mcg Intravenous Q Fri-HD  . feeding supplement (NEPRO CARB STEADY)  237 mL Oral TID BM  . fluticasone  1 spray Each Nare Daily  . gabapentin  300 mg Oral BID  . hydrALAZINE  100 mg Oral BID  . insulin aspart  0-15 Units Subcutaneous TID WC  . insulin aspart  0-5 Units Subcutaneous QHS    . insulin aspart  8 Units Subcutaneous TID WC  . insulin glargine  15 Units Subcutaneous Daily  . latanoprost  1 drop Both Eyes QHS  . pantoprazole (PROTONIX) IV  40 mg Intravenous Q12H  . piperacillin-tazobactam (ZOSYN)  IV  2.25 g Intravenous Q8H  . rosuvastatin  20 mg Oral Daily  . sodium chloride  3 mL Intravenous Q12H   Continuous Infusions:  PRN Meds: ondansetron (ZOFRAN) IV, oxyCODONE, traMADol, trazodone  Time spent: 30 minutes  Author: Berle Mull, MD [facility] Pager: [phone] 06/27/2015 5:10 PM  If 7PM-7AM, please contact night-coverage at [email or web address], password [facility]

## 2015-06-28 LAB — RENAL FUNCTION PANEL
Albumin: 1.8 g/dL — ABNORMAL LOW (ref 3.5–5.0)
Anion gap: 11 (ref 5–15)
BUN: 53 mg/dL — AB (ref 6–20)
CHLORIDE: 97 mmol/L — AB (ref 101–111)
CO2: 22 mmol/L (ref 22–32)
CREATININE: 7.65 mg/dL — AB (ref 0.61–1.24)
Calcium: 7.7 mg/dL — ABNORMAL LOW (ref 8.9–10.3)
GFR calc Af Amer: 7 mL/min — ABNORMAL LOW (ref >60)
GFR calc non Af Amer: 6 mL/min — ABNORMAL LOW (ref >60)
Glucose, Bld: 120 mg/dL — ABNORMAL HIGH (ref 65–99)
POTASSIUM: 3.3 mmol/L — AB (ref 3.5–5.1)
Phosphorus: 5.8 mg/dL — ABNORMAL HIGH (ref 2.5–4.6)
Sodium: 130 mmol/L — ABNORMAL LOW (ref 135–145)

## 2015-06-28 LAB — CBC
HCT: 26.5 % — ABNORMAL LOW (ref 39.0–52.0)
Hemoglobin: 8.6 g/dL — ABNORMAL LOW (ref 13.0–17.0)
MCH: 27.5 pg (ref 26.0–34.0)
MCHC: 32.5 g/dL (ref 30.0–36.0)
MCV: 84.7 fL (ref 78.0–100.0)
PLATELETS: 316 10*3/uL (ref 150–400)
RBC: 3.13 MIL/uL — AB (ref 4.22–5.81)
RDW: 14.3 % (ref 11.5–15.5)
WBC: 17.3 10*3/uL — AB (ref 4.0–10.5)

## 2015-06-28 LAB — GLUCOSE, CAPILLARY
GLUCOSE-CAPILLARY: 126 mg/dL — AB (ref 65–99)
GLUCOSE-CAPILLARY: 126 mg/dL — AB (ref 65–99)
GLUCOSE-CAPILLARY: 142 mg/dL — AB (ref 65–99)
GLUCOSE-CAPILLARY: 108 mg/dL — AB (ref 65–99)
Glucose-Capillary: 89 mg/dL (ref 65–99)
Glucose-Capillary: 96 mg/dL (ref 65–99)
Glucose-Capillary: 157 mg/dL — ABNORMAL HIGH (ref 65–99)
Glucose-Capillary: 153 mg/dL — ABNORMAL HIGH (ref 65–99)

## 2015-06-28 MED ORDER — SENNOSIDES-DOCUSATE SODIUM 8.6-50 MG PO TABS
1.0000 | ORAL_TABLET | Freq: Two times a day (BID) | ORAL | Status: DC
  Administered 2015-06-28 – 2015-07-15 (×30): 1 via ORAL
  Filled 2015-06-28 (×32): qty 1

## 2015-06-28 MED ORDER — BISACODYL 10 MG RE SUPP
10.0000 mg | Freq: Once | RECTAL | Status: AC
  Administered 2015-06-28: 10 mg via RECTAL
  Filled 2015-06-28: qty 1

## 2015-06-28 MED ORDER — PIPERACILLIN-TAZOBACTAM IN DEX 2-0.25 GM/50ML IV SOLN
2.2500 g | Freq: Three times a day (TID) | INTRAVENOUS | Status: AC
  Administered 2015-06-28 – 2015-07-01 (×9): 2.25 g via INTRAVENOUS
  Filled 2015-06-28 (×11): qty 50

## 2015-06-28 MED ORDER — PIPERACILLIN-TAZOBACTAM IN DEX 2-0.25 GM/50ML IV SOLN
2.2500 g | Freq: Four times a day (QID) | INTRAVENOUS | Status: DC
  Filled 2015-06-28 (×3): qty 50

## 2015-06-28 MED ORDER — SENNOSIDES-DOCUSATE SODIUM 8.6-50 MG PO TABS: 1.0000 | ORAL_TABLET | Freq: Two times a day (BID) | ORAL | Status: DC | PRN

## 2015-06-28 MED ORDER — POLYETHYLENE GLYCOL 3350 17 G PO PACK
17.0000 g | PACK | Freq: Every day | ORAL | Status: DC
  Administered 2015-06-28 – 2015-07-15 (×14): 17 g via ORAL
  Filled 2015-06-28 (×15): qty 1

## 2015-06-28 NOTE — Progress Notes (Signed)
[facility] Progress Note  Patient: David Ford [id number]   PCP: No primary care provider on file. DOB: Jul 28, 1940   DOA: 06/17/2015   DOS: 06/28/2015   Date of Service: the patient was seen and examined on 06/28/2015  Subjective: Patient denies any abdominal pain nausea or vomiting. As per the RN patient had one bowel movement although the patient denies. Activity: Out of bed to chair Last BM: 06/28/2015  Assessment and Plan: 1. Diabetic foot infection ([place]) Sepsis, most likely secondary to cellulitis with diabetes. No blood culture on admission, MRSA screen negative,  Orthopedic consulted again, awaiting recommendation.  Continue Zosyn. We will complete a total 14 days of treatment on 07/01/2015  2. Small bowel obstruction partial. Tolerating oral diet, no further bowel movement but no further vomiting as well. Abdomen is not distended. Patient's constipation relieved with Dulcolax suppository 1. Continue MiraLAX and Senokot scheduled.  3. Upper GI bleed. One episode of hemoptysis, probable gastritis, no further episode. H&H remains stable. GI consulted initially and thought to be secondary to trauma from NG tube insertion.  4. Anuric acute on chronic kidney disease stage III. Possible diabetic nephropathy. Nephrology following with the patient. Foley catheter discontinued, no urine output,  No signs of recovery Appreciate input from nephrology. Continue acute hemodialysis as needed per nephrology.  5. Elevated troponin. Echocardiogram 50-55% EF, no wall motion abnormality, grade 1 diastolic dysfunction. As per earlier discussion with cardiology patient will need outpatient workup. It was thought most likely secondary to renal failure as well as demand ischemia.  6. Atrial fibrillation. Paroxysmal, currently normal sinus rhythm spontaneously [place] Vasc score elevated. Will need anticoagulation, currently holding in the setting of gastritis with probable upper GI  bleed. Also awaiting recommendations from orthopedics regarding procedure. started on heparin earlier.  7. Bilateral shoulder pain. Currently have resolved. He may need MRI if recurrence as an outpatient  8. Essential hypertension. Resume oral medication.  9. Type 2 diabetes mellitus, uncontrolled Hemoglobin A1c 14.2 December 16. Sugars are now well controlled Moderate sliding scale, 4 units AC coverage and 15 units lantus.  DVT Prophylaxis: subcutaneous Heparin Nutrition: Full liquid diet Advance goals of care discussion: Full code  Brief Summary of Hospitalization:  HPI: As per the H and P dictated on admission, "David Ford is a 75 y.o. male   has a past medical history of Diabetes mellitus without complication ([place]) and Hypertension.   Presented with a fall onto the floor from the couch. He has communicated that has been down on the floor for past 3 days. He was not able to walk but could crawl and states could reach fridge and microwave so he ate and drank. He reports he was able able to call on the phone the entire time but did not request any help until today when he realized he was not getting any better. He has been living in [place] for past 20 years but has a PCP in [place]. He reports flying there every 3 months.  Patient lives at home alone he was found today by the friend who called EMS. Patient has history of diabetes but his glucose meter has been broken. Patient has been fatigued. Denies any loss of consciousness patient reports some file order from his toes. Reports generalized muscle aches. Reports loss of strength in arms and legs. Reports he was able to urinate today.  He is unsure if he has any history of kidney disease. Unsure if he's been taking his medications Emergency department he was found  to have evidence of rhabdomyolysis with CKs 3053, his initial lab work showing creatinine of 4 potassium was 6.0 and an gap was 16. Patient was unable to produce  urine. Troponin slightly elevated to 0.05 albumin 2.9 White blood cell, was noted to be elevated 16.2 Hospitalist was called for admission for dehydration rhabdomyolysis evidence of acute renal failure" Procedures: Echocardiogram 06/18/2015 50-55% ejection fraction. Hemodialysis catheter insertion 06/19/2015 Consultants: Nephrology, gastroenterology, general surgery, orthopedic surgery Dr. Sharol Given, critical care for dialysis catheter placement. Antibiotics: Anti-infectives    Start     Dose/Rate Route Frequency Ordered Stop   06/23/15 0100  piperacillin-tazobactam (ZOSYN) IVPB 2.25 g     2.25 g 100 mL/hr over 30 Minutes Intravenous Every 8 hours 06/22/15 1912 07/01/15 2359   06/22/15 1600  vancomycin (VANCOCIN) IVPB 1000 mg/200 mL premix     1,000 mg 200 mL/hr over 60 Minutes Intravenous  Once 06/22/15 1448 06/22/15 1730   06/20/15 1400  vancomycin (VANCOCIN) IVPB 1000 mg/200 mL premix     1,000 mg 200 mL/hr over 60 Minutes Intravenous  Once 06/20/15 1205 06/20/15 1538   06/19/15 2100  vancomycin (VANCOCIN) IVPB 1000 mg/200 mL premix  Status:  Discontinued     1,000 mg 200 mL/hr over 60 Minutes Intravenous Every 48 hours 06/17/15 2214 06/18/15 0945   06/18/15 1200  vancomycin (VANCOCIN) IVPB 1000 mg/200 mL premix  Status:  Discontinued     1,000 mg 200 mL/hr over 60 Minutes Intravenous Every 48 hours 06/18/15 0945 06/20/15 1204   06/18/15 0530  piperacillin-tazobactam (ZOSYN) IVPB 2.25 g  Status:  Discontinued     2.25 g 100 mL/hr over 30 Minutes Intravenous Every 8 hours 06/17/15 2214 06/22/15 1912   06/17/15 2115  piperacillin-tazobactam (ZOSYN) IVPB 3.375 g     3.375 g 100 mL/hr over 30 Minutes Intravenous  Once 06/17/15 2111 06/17/15 2151   06/17/15 2115  vancomycin (VANCOCIN) IVPB 1000 mg/200 mL premix     1,000 mg 200 mL/hr over 60 Minutes Intravenous  Once 06/17/15 2111 06/18/15 0235      Family Communication: no family was present at bedside, at the time of interview.    Disposition: Barriers to safe discharge: Improvement in renal function, further workup regarding diabetic foot infection   Intake/Output Summary (Last 24 hours) at 06/28/15 1502 Last data filed at 06/28/15 1043  Gross per 24 hour  Intake    290 ml  Output      1 ml  Net    289 ml   Filed Weights   06/26/15 0818 06/26/15 1228 06/26/15 2103  Weight: 93.3 kg (205 lb 11 oz) 92.8 kg (204 lb 9.4 oz) 92.6 kg (204 lb 2.3 oz)    Objective: Physical Exam: Filed Vitals:   06/27/15 2100 06/28/15 0500 06/28/15 0515 06/28/15 0831  BP: 127/64 132/67  108/46  Pulse: 79 88  88  Temp:   98.5 F (36.9 C) 96 F (35.6 C)  TempSrc:    Oral  Resp: 18 18  17   Height:      Weight:      SpO2: 99% 96%  96%    General: Appear in mild distress, no Rash; Oral Mucosa moist. Cardiovascular: S1 and S2 Present, no Murmur,  Respiratory: Bilateral Air entry present and Clear to Auscultation, no Crackles, no wheezes Abdomen: Bowel Sound present, Soft and no tenderness Extremities: no Pedal edema, no calf tenderness  Data Reviewed: CBC:  Recent Labs Lab 06/24/15 0530 06/25/15 0248 06/26/15 0700 06/27/15 0531 06/28/15  0509  WBC 16.8* 17.3* 16.9* 15.3* 17.3*  NEUTROABS  --  13.6*  --   --   --   HGB 10.6* 9.8* 9.5* 8.0* 8.6*  HCT 31.6* 28.8* 28.0* 24.4* 26.5*  MCV 82.1 83.0 82.8 83.6 84.7  PLT 312 319 333 249 [id number]   Basic Metabolic Panel:  Recent Labs Lab 06/24/15 0530 06/25/15 0248 06/26/15 0700 06/27/15 0531 06/28/15 0509  NA 140 135  135 129* 132* 130*  K 4.3 3.7  3.7 3.7 3.7 3.3*  CL 100* 98*  98* 94* 98* 97*  CO2 20* 24  24 21* 22 22  GLUCOSE 165* 289*  288* 243* 254* 120*  BUN 87* 56*  56* 69* 39* 53*  CREATININE 9.59* 6.60*  6.46* 8.25* 6.04* 7.65*  CALCIUM 8.6* 8.0*  8.1* 7.8* 7.4* 7.7*  PHOS 7.8* 4.3 5.2* 4.4 5.8*   Liver Function Tests:  Recent Labs Lab 06/24/15 0530 06/25/15 0248 06/26/15 0700 06/27/15 0531 06/28/15 0509  AST  --  18  --   --   --   ALT   --  18  --   --   --   ALKPHOS  --  81  --   --   --   BILITOT  --  0.9  --   --   --   PROT  --  5.8*  --   --   --   ALBUMIN 1.8* 1.6*  1.7* 1.8* 1.7* 1.8*   No results for input(s): LIPASE, AMYLASE in the last 168 hours. No results for input(s): AMMONIA in the last 168 hours.  Cardiac Enzymes: No results for input(s): CKTOTAL, CKMB, CKMBINDEX, TROPONINI in the last 168 hours.  BNP (last 3 results) No results for input(s): BNP in the last 8760 hours.  CBG:  Recent Labs Lab 06/28/15 0344 06/28/15 0537 06/28/15 0633 06/28/15 0753 06/28/15 1133  GLUCAP 96 126* 126* 142* 157*    Recent Results (from the past 240 hour(s))  Surgical pcr screen     Status: None   Collection Time: 06/19/15  5:25 AM  Result Value Ref Range Status   MRSA, PCR NEGATIVE NEGATIVE Final   Staphylococcus aureus NEGATIVE NEGATIVE Final    Comment:        The Xpert SA Assay (FDA approved for NASAL specimens in patients over 21 years of age), is one component of a comprehensive surveillance program.  Test performance has been validated by [facility] for patients greater than or equal to 35 year old. It is not intended to diagnose infection nor to guide or monitor treatment.      Studies: No results found.   Scheduled Meds: . antiseptic oral rinse  7 mL Mouth Rinse q12n4p  . brimonidine  1 drop Both Eyes BID  . carvedilol  6.25 mg Oral BID WC  . chlorhexidine  15 mL Mouth Rinse BID  . darbepoetin (ARANESP) injection - DIALYSIS  100 mcg Intravenous Q Fri-HD  . feeding supplement (NEPRO CARB STEADY)  237 mL Oral TID BM  . fluticasone  1 spray Each Nare Daily  . gabapentin  300 mg Oral BID  . hydrALAZINE  100 mg Oral BID  . insulin aspart  0-15 Units Subcutaneous TID WC  . insulin aspart  0-5 Units Subcutaneous QHS  . insulin aspart  8 Units Subcutaneous TID WC  . insulin glargine  15 Units Subcutaneous Daily  . latanoprost  1 drop Both Eyes QHS  . pantoprazole (PROTONIX) IV  40  mg  Intravenous Q12H  . piperacillin-tazobactam (ZOSYN)  IV  2.25 g Intravenous Q8H  . polyethylene glycol  17 g Oral Daily  . rosuvastatin  20 mg Oral Daily  . senna-docusate  1 tablet Oral BID  . sodium chloride  3 mL Intravenous Q12H   Continuous Infusions:  PRN Meds: guaiFENesin, ondansetron (ZOFRAN) IV, oxyCODONE, traMADol, trazodone  Time spent: 30 minutes  Author: Berle Mull, MD [facility] Pager: [phone] 06/28/2015 3:02 PM  If 7PM-7AM, please contact night-coverage at [email or web address], password [facility]

## 2015-06-28 NOTE — Progress Notes (Signed)
[facility] Rounding Note  Subjective:  No complaints Eating, bowels moving, no abd pain Last HD 1/6 Still no urine output  Physical Exam:  BP 108/46 mmHg  Pulse 88  Temp(Src) 96 F (35.6 C) (Oral)  Resp 17  Ht 6\' 2"  (1.88 m)  Wt 92.6 kg (204 lb 2.3 oz)  BMI 26.20 kg/m2  SpO2 96%  GEN: NAD, elderly male lying in bed.  Very pleasant CV: Irreg S1S2 with wide split S2 PULM: Ant clear. ABD: Mod abd distension. + BS. No focal tenderness  EXT:No edema.  Foot not examined (wrapped). R IJ Temp HD cath  (12/30)   Scheduled Meds: . antiseptic oral rinse  7 mL Mouth Rinse q12n4p  . bisacodyl  10 mg Rectal Once  . brimonidine  1 drop Both Eyes BID  . carvedilol  6.25 mg Oral BID WC  . chlorhexidine  15 mL Mouth Rinse BID  . darbepoetin (ARANESP) injection - DIALYSIS  100 mcg Intravenous Q Fri-HD  . feeding supplement (NEPRO CARB STEADY)  237 mL Oral TID BM  . fluticasone  1 spray Each Nare Daily  . gabapentin  300 mg Oral BID  . hydrALAZINE  100 mg Oral BID  . insulin aspart  0-15 Units Subcutaneous TID WC  . insulin aspart  0-5 Units Subcutaneous QHS  . insulin aspart  8 Units Subcutaneous TID WC  . insulin glargine  15 Units Subcutaneous Daily  . latanoprost  1 drop Both Eyes QHS  . pantoprazole (PROTONIX) IV  40 mg Intravenous Q12H  . piperacillin-tazobactam (ZOSYN)  IV  2.25 g Intravenous Q8H  . polyethylene glycol  17 g Oral Daily  . rosuvastatin  20 mg Oral Daily  . senna-docusate  1 tablet Oral BID  . sodium chloride  3 mL Intravenous Q12H       PRN Meds:.guaiFENesin, ondansetron (ZOFRAN) IV, oxyCODONE, traMADol, trazodone    Recent Labs Lab 06/26/15 0700 06/27/15 0531 06/28/15 0509  NA 129* 132* 130*  K 3.7 3.7 3.3*  CL 94* 98* 97*  CO2 21* 22 22  GLUCOSE 243* 254* 120*  BUN 69* 39* 53*  CREATININE 8.25* 6.04* 7.65*  CALCIUM 7.8* 7.4* 7.7*  PHOS 5.2* 4.4 5.8*    Recent Labs Lab 06/25/15 0248 06/26/15 0700 06/27/15 0531  06/28/15 0509  WBC 17.3* 16.9* 15.3* 17.3*  NEUTROABS 13.6*  --   --   --   HGB 9.8* 9.5* 8.0* 8.6*  HCT 28.8* 28.0* 24.4* 26.5*  MCV 83.0 82.8 83.6 84.7  PLT 319 333 249 316    Background: 37M AAM with known renal insufficiency, unclear duration (creatinine 2.0 in 04/2015), mild hyperkalemia, admitted after having been down on ground for 3d with a creatinine of 4; on ACEi; with mild rhabdomyolysis; septic with diabetic foot ulcer/gangrene. Hospital course complicated by AF/RVR, GIB. Progressive AKI, oligoanuric. HD initiated 12/31 (R IJ temp cath 12/30)  ASSESSMENT/RECOMMENDATIONS  1. Anuric AKI on CKD3 (SCr 04/2015 was 2.0) (Probable diabetic nephropathy with baseline CKD3).  Rhabdo/gangrenous foot w/sepsis/vol depletion/ACE PTA-->all led to ischemic ATN.  s/p R IJ Temp HD cath 06/19/15 and started HD 12/31. Still negligible UOP. Foley out. Prn bladder scans. Minimal urine. Remains HD dependent. No evidence yet for any recovery. Check labs AM. Anticipate next HD on Monday. 2. Dialysis access: R IJ temp cath (12/30) 3. Diabetic foot ulcer/gangrene of left 1st 2 toes - deferred until more stable per Dr. Sharol Given - he has been reconsulted? 4. Sepsis d/t #3. Vanco  and zosyn 5. Partial SBO with partial small bowel malrotation.  Resolved.  Bowels moving. Tolerating diet. Surgery has signed off. 6. UGIB - Felt gastritis/asa/tube trauma/uremia. Hb variable last 4 days. On  protonix. 7. Anemia - Fe def. Rec'd Feraheme 12/31. Added aranesp 100/week. Dosed 1/6.   8. DM2 - ssi 9. HTN - meds 10. AFib with RVR - occurred with HD 12/31. BB for rate control  11. + trops. ? Demand ischemia. EF 50-55%    Jamal Maes, MD [facility] [phone] Pager 06/28/2015, 10:50 AM

## 2015-06-29 LAB — GLUCOSE, CAPILLARY
GLUCOSE-CAPILLARY: 127 mg/dL — AB (ref 65–99)
Glucose-Capillary: 87 mg/dL (ref 65–99)

## 2015-06-29 LAB — CBC
HCT: 24.5 % — ABNORMAL LOW (ref 39.0–52.0)
HEMATOCRIT: 24 % — AB (ref 39.0–52.0)
HEMOGLOBIN: 8.3 g/dL — AB (ref 13.0–17.0)
Hemoglobin: 8.3 g/dL — ABNORMAL LOW (ref 13.0–17.0)
MCH: 28.1 pg (ref 26.0–34.0)
MCH: 28.5 pg (ref 26.0–34.0)
MCHC: 34.6 g/dL (ref 30.0–36.0)
MCHC: 33.9 g/dL (ref 30.0–36.0)
MCV: 81.4 fL (ref 78.0–100.0)
MCV: 84.2 fL (ref 78.0–100.0)
PLATELETS: 364 10*3/uL (ref 150–400)
Platelets: 369 10*3/uL (ref 150–400)
RBC: 2.95 MIL/uL — ABNORMAL LOW (ref 4.22–5.81)
RBC: 2.91 MIL/uL — ABNORMAL LOW (ref 4.22–5.81)
RDW: 14.2 % (ref 11.5–15.5)
RDW: 14.6 % (ref 11.5–15.5)
WBC: 12.8 10*3/uL — ABNORMAL HIGH (ref 4.0–10.5)
WBC: 12 10*3/uL — ABNORMAL HIGH (ref 4.0–10.5)

## 2015-06-29 LAB — RENAL FUNCTION PANEL
ALBUMIN: 1.7 g/dL — AB (ref 3.5–5.0)
Anion gap: 14 (ref 5–15)
BUN: 65 mg/dL — AB (ref 6–20)
CHLORIDE: 96 mmol/L — AB (ref 101–111)
CO2: 20 mmol/L — ABNORMAL LOW (ref 22–32)
CREATININE: 9.94 mg/dL — AB (ref 0.61–1.24)
Calcium: 7.7 mg/dL — ABNORMAL LOW (ref 8.9–10.3)
GFR calc Af Amer: 5 mL/min — ABNORMAL LOW (ref >60)
GFR calc non Af Amer: 5 mL/min — ABNORMAL LOW (ref >60)
GLUCOSE: 90 mg/dL (ref 65–99)
PHOSPHORUS: 8.3 mg/dL — AB (ref 2.5–4.6)
POTASSIUM: 3.3 mmol/L — AB (ref 3.5–5.1)
Sodium: 130 mmol/L — ABNORMAL LOW (ref 135–145)

## 2015-06-29 LAB — PROTIME-INR
INR: 1.29 (ref 0.00–1.49)
PROTHROMBIN TIME: 16.3 s — AB (ref 11.6–15.2)

## 2015-06-29 LAB — APTT: APTT: 38 s — AB (ref 24–37)

## 2015-06-29 MED ORDER — LIDOCAINE HCL (PF) 1 % IJ SOLN: 5.0000 mL | INTRAMUSCULAR | Status: DC | PRN

## 2015-06-29 MED ORDER — INSULIN ASPART 100 UNIT/ML ~~LOC~~ SOLN
0.0000 [IU] | Freq: Three times a day (TID) | SUBCUTANEOUS | Status: DC
  Administered 2015-06-30: 3 [IU] via SUBCUTANEOUS
  Administered 2015-06-30 – 2015-07-01 (×2): 2 [IU] via SUBCUTANEOUS
  Administered 2015-07-01: 5 [IU] via SUBCUTANEOUS
  Administered 2015-07-02: 3 [IU] via SUBCUTANEOUS
  Administered 2015-07-03: 5 [IU] via SUBCUTANEOUS
  Administered 2015-07-04 – 2015-07-05 (×3): 3 [IU] via SUBCUTANEOUS
  Administered 2015-07-05 (×2): 5 [IU] via SUBCUTANEOUS
  Administered 2015-07-06 (×2): 2 [IU] via SUBCUTANEOUS
  Administered 2015-07-06 – 2015-07-07 (×2): 3 [IU] via SUBCUTANEOUS
  Administered 2015-07-08 (×2): 5 [IU] via SUBCUTANEOUS
  Administered 2015-07-09: 3 [IU] via SUBCUTANEOUS
  Administered 2015-07-09 – 2015-07-10 (×3): 5 [IU] via SUBCUTANEOUS
  Administered 2015-07-11 (×2): 2 [IU] via SUBCUTANEOUS
  Administered 2015-07-12: 8 [IU] via SUBCUTANEOUS
  Administered 2015-07-12: 2 [IU] via SUBCUTANEOUS
  Administered 2015-07-12: 8 [IU] via SUBCUTANEOUS
  Administered 2015-07-13 (×2): 2 [IU] via SUBCUTANEOUS
  Administered 2015-07-14: 5 [IU] via SUBCUTANEOUS
  Administered 2015-07-15 (×2): 2 [IU] via SUBCUTANEOUS
  Administered 2015-07-15: 3 [IU] via SUBCUTANEOUS

## 2015-06-29 MED ORDER — HEPARIN SODIUM (PORCINE) 1000 UNIT/ML DIALYSIS: 1000.0000 [IU] | INTRAMUSCULAR | Status: DC | PRN

## 2015-06-29 MED ORDER — LIDOCAINE-PRILOCAINE 2.5-2.5 % EX CREA
1.0000 "application " | TOPICAL_CREAM | CUTANEOUS | Status: DC | PRN
  Filled 2015-06-29: qty 5

## 2015-06-29 MED ORDER — INSULIN ASPART 100 UNIT/ML ~~LOC~~ SOLN
8.0000 [IU] | Freq: Three times a day (TID) | SUBCUTANEOUS | Status: DC
  Administered 2015-06-29 (×2): 8 [IU] via SUBCUTANEOUS

## 2015-06-29 MED ORDER — INSULIN ASPART 100 UNIT/ML ~~LOC~~ SOLN: 6.0000 [IU] | Freq: Three times a day (TID) | SUBCUTANEOUS | Status: DC

## 2015-06-29 MED ORDER — SODIUM CHLORIDE 0.9 % IV SOLN
100.0000 mL | INTRAVENOUS | Status: DC | PRN
Start: 2015-06-29 — End: 2015-06-29

## 2015-06-29 MED ORDER — INSULIN ASPART 100 UNIT/ML ~~LOC~~ SOLN
6.0000 [IU] | Freq: Three times a day (TID) | SUBCUTANEOUS | Status: DC
  Administered 2015-06-30 – 2015-07-15 (×28): 6 [IU] via SUBCUTANEOUS

## 2015-06-29 MED ORDER — INSULIN ASPART 100 UNIT/ML ~~LOC~~ SOLN
0.0000 [IU] | Freq: Three times a day (TID) | SUBCUTANEOUS | Status: DC
  Administered 2015-06-29: 2 [IU] via SUBCUTANEOUS

## 2015-06-29 MED ORDER — SEVELAMER CARBONATE 800 MG PO TABS
1600.0000 mg | ORAL_TABLET | Freq: Three times a day (TID) | ORAL | Status: DC
  Administered 2015-06-29 – 2015-07-15 (×36): 1600 mg via ORAL
  Filled 2015-06-29 (×35): qty 2

## 2015-06-29 MED ORDER — ALTEPLASE 2 MG IJ SOLR
2.0000 mg | Freq: Once | INTRAMUSCULAR | Status: DC | PRN
  Filled 2015-06-29: qty 2

## 2015-06-29 MED ORDER — SODIUM CHLORIDE 0.9 % IV SOLN: 100.0000 mL | INTRAVENOUS | Status: DC | PRN

## 2015-06-29 MED ORDER — PENTAFLUOROPROP-TETRAFLUOROETH EX AERO: 1.0000 "application " | INHALATION_SPRAY | CUTANEOUS | Status: DC | PRN

## 2015-06-29 NOTE — Progress Notes (Signed)
[facility] Progress Note  Patient: David Ford [id number]   PCP: No primary care provider on file. DOB: Jun 09, 1941   DOA: 06/17/2015   DOS: 06/29/2015   Date of Service: the patient was seen and examined on 06/29/2015  Subjective: Patient denies any acute complaint. Did have a bowel movement yesterday. Continues to pass gas. Activity: Bedridden Last BM: 06/28/2015  Assessment and Plan: 1. Diabetic foot infection ([place]) Sepsis, most likely secondary to cellulitis with diabetes. No blood culture on admission, MRSA screen negative,  Orthopedic Dr. Sharol Given consulted, is planning to attempt amputation this week. Continue Zosyn. We will complete a total 14 days of treatment on 07/01/2015  2. Small bowel obstruction partial. Tolerating oral diet, Abdomen is not distended. Patient's constipation relieved with Dulcolax suppository 1. Continue MiraLAX and Senokot scheduled.  3. Upper GI bleed. Resolved. Probably from gastritis as well as NG tube insertion. Start iron supplementation  4. Anuric acute on chronic kidney disease stage III. Possible diabetic nephropathy. Likely ATN Nephrology following with the patient. no urine output, No signs of recovery Appreciate input from nephrology.  Continue acute hemodialysis as needed per nephrology. TDC will be planned after amputation  5. Coronary artery disease Patient had elevated troponin initially, peak 0.34 Echocardiogram 50-55% EF, no wall motion abnormality, grade 1 diastolic dysfunction. As per earlier discussion with cardiology patient will need outpatient workup. It was thought most likely secondary to renal failure as well as demand ischemia.  6. Atrial fibrillation. Paroxysmal, currently normal sinus rhythm spontaneously [place] Vasc score elevated. We discussed to anticoagulation with the patient after amputation.  7. Bilateral shoulder pain. Currently have resolved. He may need MRI if recurrence as an outpatient  8.  Essential hypertension. Controlled, continue carvedilol, hydralazine  9. Type 2 diabetes mellitus, uncontrolled Hemoglobin A1c 14.2 December 16. Sugars are now well controlled Moderate sliding scale, 6 units AC coverage and 15 units lantus.  DVT Prophylaxis: subcutaneous Heparin Nutrition: RENAL carb modified diet Advance goals of care discussion: Full code  Brief Summary of Hospitalization:  HPI: As per the H and P dictated on admission, "David Ford is a 75 y.o. male   has a past medical history of Diabetes mellitus without complication ([place]) and Hypertension.   Presented with a fall onto the floor from the couch. He has communicated that has been down on the floor for past 3 days. He was not able to walk but could crawl and states could reach fridge and microwave so he ate and drank. He reports he was able able to call on the phone the entire time but did not request any help until today when he realized he was not getting any better. He has been living in [place] for past 20 years but has a PCP in [place]. He reports flying there every 3 months.  Patient lives at home alone he was found today by the friend who called EMS. Patient has history of diabetes but his glucose meter has been broken. Patient has been fatigued. Denies any loss of consciousness patient reports some file order from his toes. Reports generalized muscle aches. Reports loss of strength in arms and legs. Reports he was able to urinate today.  He is unsure if he has any history of kidney disease. Unsure if he's been taking his medications Emergency department he was found to have evidence of rhabdomyolysis with CKs 3053, his initial lab work showing creatinine of 4 potassium was 6.0 and an gap was 16. Patient was unable to produce urine.  Troponin slightly elevated to 0.05 albumin 2.9 White blood cell, was noted to be elevated 16.2 Hospitalist was called for admission for dehydration rhabdomyolysis evidence of  acute renal failure" Procedures: Echocardiogram 06/18/2015 50-55% ejection fraction. Hemodialysis catheter insertion 06/19/2015 Consultants: Nephrology, gastroenterology, general surgery, orthopedic surgery Dr. Sharol Given, critical care for dialysis catheter placement. Antibiotics: Anti-infectives    Start     Dose/Rate Route Frequency Ordered Stop   06/28/15 2200  piperacillin-tazobactam (ZOSYN) IVPB 2.25 g     2.25 g 100 mL/hr over 30 Minutes Intravenous 3 times per day 06/28/15 1737 07/02/15 0559   06/28/15 1945  piperacillin-tazobactam (ZOSYN) IVPB 2.25 g  Status:  Discontinued     2.25 g 100 mL/hr over 30 Minutes Intravenous Every 6 hours 06/28/15 1736 06/28/15 1737   06/23/15 0100  piperacillin-tazobactam (ZOSYN) IVPB 2.25 g  Status:  Discontinued     2.25 g 100 mL/hr over 30 Minutes Intravenous Every 8 hours 06/22/15 1912 06/28/15 1735   06/22/15 1600  vancomycin (VANCOCIN) IVPB 1000 mg/200 mL premix     1,000 mg 200 mL/hr over 60 Minutes Intravenous  Once 06/22/15 1448 06/22/15 1730   06/20/15 1400  vancomycin (VANCOCIN) IVPB 1000 mg/200 mL premix     1,000 mg 200 mL/hr over 60 Minutes Intravenous  Once 06/20/15 1205 06/20/15 1538   06/19/15 2100  vancomycin (VANCOCIN) IVPB 1000 mg/200 mL premix  Status:  Discontinued     1,000 mg 200 mL/hr over 60 Minutes Intravenous Every 48 hours 06/17/15 2214 06/18/15 0945   06/18/15 1200  vancomycin (VANCOCIN) IVPB 1000 mg/200 mL premix  Status:  Discontinued     1,000 mg 200 mL/hr over 60 Minutes Intravenous Every 48 hours 06/18/15 0945 06/20/15 1204   06/18/15 0530  piperacillin-tazobactam (ZOSYN) IVPB 2.25 g  Status:  Discontinued     2.25 g 100 mL/hr over 30 Minutes Intravenous Every 8 hours 06/17/15 2214 06/22/15 1912   06/17/15 2115  piperacillin-tazobactam (ZOSYN) IVPB 3.375 g     3.375 g 100 mL/hr over 30 Minutes Intravenous  Once 06/17/15 2111 06/17/15 2151   06/17/15 2115  vancomycin (VANCOCIN) IVPB 1000 mg/200 mL premix     1,000  mg 200 mL/hr over 60 Minutes Intravenous  Once 06/17/15 2111 06/18/15 0235      Family Communication: no family was present at bedside, at the time of interview.   Disposition: Barriers to safe discharge: Improvement in renal function, postoperative course with diabetic foot infection   Intake/Output Summary (Last 24 hours) at 06/29/15 1603 Last data filed at 06/28/15 1851  Gross per 24 hour  Intake    275 ml  Output      0 ml  Net    275 ml   Filed Weights   06/26/15 2103 06/28/15 1900 06/29/15 1437  Weight: 92.6 kg (204 lb 2.3 oz) 100.9 kg (222 lb 7.1 oz) 97.4 kg (214 lb 11.7 oz)    Objective: Physical Exam: Filed Vitals:   06/29/15 1449 06/29/15 1455 06/29/15 1502 06/29/15 1530  BP: 122/70 130/72 106/83 116/54  Pulse: 72 73 66 72  Temp:      TempSrc:      Resp: 16     Height:      Weight:      SpO2: 97%       General: Appear in mild distress, no Rash; Oral Mucosa moist. Cardiovascular: S1 and S2 Present, no Murmur,  Respiratory: Bilateral Air entry present and Clear to Auscultation, no Crackles, no wheezes Abdomen:  Bowel Sound present, Soft and no tenderness Extremities: no Pedal edema, no calf tenderness  Data Reviewed: CBC:  Recent Labs Lab 06/25/15 0248 06/26/15 0700 06/27/15 0531 06/28/15 0509 06/29/15 0729 06/29/15 1505  WBC 17.3* 16.9* 15.3* 17.3* 12.8* 12.0*  NEUTROABS 13.6*  --   --   --   --   --   HGB 9.8* 9.5* 8.0* 8.6* 8.3* 8.3*  HCT 28.8* 28.0* 24.4* 26.5* 24.0* 24.5*  MCV 83.0 82.8 83.6 84.7 81.4 84.2  PLT 319 333 249 316 364 0000000   Basic Metabolic Panel:  Recent Labs Lab 06/25/15 0248 06/26/15 0700 06/27/15 0531 06/28/15 0509 06/29/15 0729  NA 135  135 129* 132* 130* 130*  K 3.7  3.7 3.7 3.7 3.3* 3.3*  CL 98*  98* 94* 98* 97* 96*  CO2 24  24 21* 22 22 20*  GLUCOSE 289*  288* 243* 254* 120* 90  BUN 56*  56* 69* 39* 53* 65*  CREATININE 6.60*  6.46* 8.25* 6.04* 7.65* 9.94*  CALCIUM 8.0*  8.1* 7.8* 7.4* 7.7* 7.7*  PHOS  4.3 5.2* 4.4 5.8* 8.3*   Liver Function Tests:  Recent Labs Lab 06/25/15 0248 06/26/15 0700 06/27/15 0531 06/28/15 0509 06/29/15 0729  AST 18  --   --   --   --   ALT 18  --   --   --   --   ALKPHOS 81  --   --   --   --   BILITOT 0.9  --   --   --   --   PROT 5.8*  --   --   --   --   ALBUMIN 1.6*  1.7* 1.8* 1.7* 1.8* 1.7*   No results for input(s): LIPASE, AMYLASE in the last 168 hours. No results for input(s): AMMONIA in the last 168 hours.  Cardiac Enzymes: No results for input(s): CKTOTAL, CKMB, CKMBINDEX, TROPONINI in the last 168 hours.  BNP (last 3 results) No results for input(s): BNP in the last 8760 hours.  CBG:  Recent Labs Lab 06/28/15 1133 06/28/15 1628 06/28/15 2146 06/29/15 0744 06/29/15 1141  GLUCAP 157* 153* 108* 87 127*    No results found for this or any previous visit (from the past 240 hour(s)).   Studies: No results found.   Scheduled Meds: . antiseptic oral rinse  7 mL Mouth Rinse q12n4p  . brimonidine  1 drop Both Eyes BID  . carvedilol  6.25 mg Oral BID WC  . chlorhexidine  15 mL Mouth Rinse BID  . darbepoetin (ARANESP) injection - DIALYSIS  100 mcg Intravenous Q Fri-HD  . feeding supplement (NEPRO CARB STEADY)  237 mL Oral TID BM  . fluticasone  1 spray Each Nare Daily  . gabapentin  300 mg Oral BID  . hydrALAZINE  100 mg Oral BID  . insulin aspart  0-15 Units Subcutaneous TID WC  . insulin aspart  0-5 Units Subcutaneous QHS  . insulin aspart  8 Units Subcutaneous TID WC  . insulin glargine  15 Units Subcutaneous Daily  . latanoprost  1 drop Both Eyes QHS  . pantoprazole (PROTONIX) IV  40 mg Intravenous Q12H  . piperacillin-tazobactam (ZOSYN)  IV  2.25 g Intravenous 3 times per day  . polyethylene glycol  17 g Oral Daily  . rosuvastatin  20 mg Oral Daily  . senna-docusate  1 tablet Oral BID  . sevelamer carbonate  1,600 mg Oral TID WC  . sodium chloride  3 mL Intravenous  Q12H   Continuous Infusions:  PRN Meds: sodium  chloride, sodium chloride, alteplase, guaiFENesin, heparin, heparin, lidocaine (PF), lidocaine-prilocaine, ondansetron (ZOFRAN) IV, oxyCODONE, pentafluoroprop-tetrafluoroeth, traMADol, trazodone  Time spent: 30 minutes  Author: Berle Mull, MD [facility] Pager: [phone] 06/29/2015 4:03 PM  If 7PM-7AM, please contact night-coverage at [email or web address], password [facility]

## 2015-06-29 NOTE — Care Management Important Message (Signed)
Important Message  Patient Details  Name: Tavari Covalt MRN: [id number] Date of Birth: 03/04/1941   Medicare Important Message Given:  Yes    Louanne Belton 06/29/2015, 2:01 [place] Message  Patient Details  Name: Zakaree Finkel MRN: [id number] Date of Birth: 1940/08/31   Medicare Important Message Given:  Yes    Zailynn Brandel G 06/29/2015, 2:01 PM

## 2015-06-29 NOTE — Progress Notes (Addendum)
[facility] Progress Note    Assessment/ Plan:   Background: 70M AAM with known renal insufficiency, unclear duration (creatinine 2.0 in 04/2015), mild hyperkalemia, admitted after having been down on ground for 3d with a creatinine of 4; on ACEi; with mild rhabdomyolysis; septic with diabetic foot ulcer/gangrene. Hospital course complicated by AF/RVR, GIB. Progressive AKI, oligoanuric. HD initiated 12/31 (R IJ temp cath 12/30)  ASSESSMENT/RECOMMENDATIONS  1. Anuric AKI on CKD3 (SCr 04/2015 was 2.0) (Probable diabetic nephropathy with baseline CKD3). Rhabdo/gangrenous foot w/sepsis/vol depletion/ACE PTA-->all led to ischemic ATN. s/p R IJ Temp HD cath 06/19/15 and started HD 12/31. Still negligible UOP. Foley out. Prn bladder scans.  Remains HD dependent. -  No evidence yet for any recovery --> HD today. - Will request  conversion to tunneled catheter by VIR after amputation to prevent seeding of the permcath. Patient remains afebrile with improving WBC. 2. Dialysis access: R IJ temp cath (12/30) 3. Diabetic foot ulcer/gangrene of left 1st 2 toes - deferred until more stable per Dr. Sharol Given - he has been reconsulted? 4. Sepsis d/t #4. Vanco and zosyn 5. Partial SBO with partial small bowel malrotation. Resolved. Bowels moving. Tolerating diet. Surgery has signed off. 6. UGIB - Felt gastritis/asa/tube trauma/uremia. Hb variable last 4 days. On protonix. 7. Anemia - Fe def. Rec'd Feraheme 12/31. Added aranesp 100/week. Dosed 1/6.  8. DM2 - ssi 9. HTN - meds 10. AFib with RVR - occurred with HD 12/31. BB for rate control  11. + trops. ? Demand ischemia. EF 50-55%  Subjective:   No complaints Eating, bowels moving, no abd pain Last HD 1/6 Still no urine output and appears to be euvolemic.   Objective:   BP 133/62 mmHg  Pulse 76  Temp(Src) 98.8 F (37.1 C) (Oral)  Resp 18  Ht 6\' 2"  (1.88 m)  Wt 100.9 kg (222 lb 7.1 oz)  BMI 28.55 kg/m2  SpO2  99%  Intake/Output Summary (Last 24 hours) at 06/29/15 1026 Last data filed at 06/28/15 1851  Gross per 24 hour  Intake   1125 ml  Output      0 ml  Net   1125 ml   Weight change:   Physical Exam: GEN: NAD, elderly male lying in bed.  Very pleasant CV: Irreg S1S2 with wide split S2 PULM: Ant clear. ABD: Mod abd distension. + BS. No focal tenderness  EXT:No edema.  Foot not examined (wrapped). R IJ Temp HD cath (12/30)   Imaging: No results found.  Labs: BMET  Recent Labs Lab 06/23/15 0420 06/24/15 0530 06/25/15 0248 06/26/15 0700 06/27/15 0531 06/28/15 0509 06/29/15 0729  NA 137 140 135  135 129* 132* 130* 130*  K 3.9 4.3 3.7  3.7 3.7 3.7 3.3* 3.3*  CL 97* 100* 98*  98* 94* 98* 97* 96*  CO2 24 20* 24  24 21* 22 22 20*  GLUCOSE 159* 165* 289*  288* 243* 254* 120* 90  BUN 63* 87* 56*  56* 69* 39* 53* 65*  CREATININE 6.88* 9.59* 6.60*  6.46* 8.25* 6.04* 7.65* 9.94*  CALCIUM 8.4* 8.6* 8.0*  8.1* 7.8* 7.4* 7.7* 7.7*  PHOS 5.6* 7.8* 4.3 5.2* 4.4 5.8* 8.3*   CBC  Recent Labs Lab 06/25/15 0248 06/26/15 0700 06/27/15 0531 06/28/15 0509 06/29/15 0729  WBC 17.3* 16.9* 15.3* 17.3* 12.8*  NEUTROABS 13.6*  --   --   --   --   HGB 9.8* 9.5* 8.0* 8.6* 8.3*  HCT 28.8* 28.0* 24.4* 26.5* 24.0*  MCV 83.0 82.8 83.6 84.7 81.4  PLT 319 333 249 316 364    Medications:    . antiseptic oral rinse  7 mL Mouth Rinse q12n4p  . brimonidine  1 drop Both Eyes BID  . carvedilol  6.25 mg Oral BID WC  . chlorhexidine  15 mL Mouth Rinse BID  . darbepoetin (ARANESP) injection - DIALYSIS  100 mcg Intravenous Q Fri-HD  . feeding supplement (NEPRO CARB STEADY)  237 mL Oral TID BM  . fluticasone  1 spray Each Nare Daily  . gabapentin  300 mg Oral BID  . hydrALAZINE  100 mg Oral BID  . insulin aspart  0-15 Units Subcutaneous TID WC  . insulin aspart  0-5 Units Subcutaneous QHS  . insulin aspart  8 Units Subcutaneous TID WC  . insulin glargine  15 Units Subcutaneous  Daily  . latanoprost  1 drop Both Eyes QHS  . pantoprazole (PROTONIX) IV  40 mg Intravenous Q12H  . piperacillin-tazobactam (ZOSYN)  IV  2.25 g Intravenous 3 times per day  . polyethylene glycol  17 g Oral Daily  . rosuvastatin  20 mg Oral Daily  . senna-docusate  1 tablet Oral BID  . sevelamer carbonate  1,600 mg Oral TID WC  . sodium chloride  3 mL Intravenous Q12H      Otelia Santee, MD 06/29/2015, 10:26 AM   Seen on dialysis at 410pm.  4K/2Ca bath through RIJ temp cath tolerating well with no complaints. Qb/qd 400/800. UF 2.5L --> net 2L

## 2015-06-29 NOTE — Progress Notes (Signed)
PT Cancellation Note  Patient Details Name: David Ford MRN: [id number] DOB: 11/25/1940   Cancelled Treatment:    Reason Eval/Treat Not Completed: Patient at procedure or test/unavailable. Pt at HD. PT to return as able.   [name] 06/29/2015, 4:07 PM  [name], PT, DPT Pager #: [phone] Office #: [phone]

## 2015-06-29 NOTE — Progress Notes (Signed)
   06/29/15 1300  Clinical Encounter Type  Visited With Patient  Visit Type Initial  Referral From Nurse;Care management  Spiritual Encounters  Spiritual Needs Literature;Emotional  Stress Factors  Patient Stress Factors Health changes (Boredom)  Care management team reported that patient kept his head covered and had not told family that he had fallen and was on floor of home when they called. [name] decided to visit. Talked to patient at length about his career and his travels. He indicated that he was bored and would like something scientific to read; if he had that he would get up and sit in a recliner. Chaplain located several journals and a book in [employer] that do not need to be returned and took them to him.

## 2015-06-29 NOTE — Progress Notes (Signed)
Patient ID: David Ford, male   DOB: 08-Dec-1940, 75 y.o.   MRN: [id number] Patient is seen in follow-up for the left foot. Patient has been medically stabilized at this time. Patient states he has no GI symptoms. Examination the left foot he has gangrenous changes of the great toe and second toe with no ascending cellulitis. Patient will need a first and second Ray amputation. I will set this up for surgery for this week.

## 2015-06-30 ENCOUNTER — Other Ambulatory Visit (HOSPITAL_COMMUNITY): Payer: Self-pay | Admitting: Orthopedic Surgery

## 2015-06-30 LAB — RENAL FUNCTION PANEL
ALBUMIN: 1.7 g/dL — AB (ref 3.5–5.0)
Anion gap: 8 (ref 5–15)
BUN: 27 mg/dL — ABNORMAL HIGH (ref 6–20)
CALCIUM: 7.1 mg/dL — AB (ref 8.9–10.3)
CO2: 25 mmol/L (ref 22–32)
CREATININE: 6.15 mg/dL — AB (ref 0.61–1.24)
Chloride: 100 mmol/L — ABNORMAL LOW (ref 101–111)
GFR, EST AFRICAN AMERICAN: 9 mL/min — AB (ref >60)
GFR, EST NON AFRICAN AMERICAN: 8 mL/min — AB (ref >60)
Glucose, Bld: 114 mg/dL — ABNORMAL HIGH (ref 65–99)
Phosphorus: 5.4 mg/dL — ABNORMAL HIGH (ref 2.5–4.6)
Potassium: 3.3 mmol/L — ABNORMAL LOW (ref 3.5–5.1)
SODIUM: 133 mmol/L — AB (ref 135–145)

## 2015-06-30 LAB — GLUCOSE, CAPILLARY
GLUCOSE-CAPILLARY: 156 mg/dL — AB (ref 65–99)
Glucose-Capillary: 147 mg/dL — ABNORMAL HIGH (ref 65–99)
Glucose-Capillary: 86 mg/dL (ref 65–99)
Glucose-Capillary: 109 mg/dL — ABNORMAL HIGH (ref 65–99)

## 2015-06-30 LAB — CBC
HCT: 23.6 % — ABNORMAL LOW (ref 39.0–52.0)
Hemoglobin: 7.8 g/dL — ABNORMAL LOW (ref 13.0–17.0)
MCH: 28.2 pg (ref 26.0–34.0)
MCHC: 33.1 g/dL (ref 30.0–36.0)
MCV: 85.2 fL (ref 78.0–100.0)
PLATELETS: 293 10*3/uL (ref 150–400)
RBC: 2.77 MIL/uL — AB (ref 4.22–5.81)
RDW: 14.7 % (ref 11.5–15.5)
WBC: 8.2 10*3/uL (ref 4.0–10.5)

## 2015-06-30 MED ORDER — PANTOPRAZOLE SODIUM 40 MG PO TBEC
40.0000 mg | DELAYED_RELEASE_TABLET | Freq: Two times a day (BID) | ORAL | Status: DC
  Administered 2015-06-30 – 2015-07-07 (×15): 40 mg via ORAL
  Filled 2015-06-30 (×14): qty 1

## 2015-06-30 NOTE — Progress Notes (Signed)
[facility] Progress Note  Patient: David Ford [id number]   PCP: No primary care provider on file. DOB: 1940-09-03   DOA: 06/17/2015   DOS: 06/30/2015   Date of Service: the patient was seen and examined on 06/30/2015  Subjective: Denies any acute complaint. No shortness of breath no cough no nausea. Did have a bowel movement yesterday. Activity: Bedridden  Assessment and Plan: 1. Diabetic foot infection ([place]) Sepsis, most likely secondary to cellulitis with diabetes. No blood culture on admission, MRSA screen negative,  Orthopedic Dr. Sharol Given consulted, is planning to attempt amputation this week. Continue Zosyn. We will complete a total 14 days of treatment on 07/01/2015  2. Small bowel obstruction partial. Tolerating oral diet, Abdomen is not distended. Patient's constipation relieved with Dulcolax suppository 1. Continue MiraLAX and Senokot scheduled.  3. Upper GI bleed. Resolved. Probably from gastritis as well as NG tube insertion. Start iron supplementation  4. Anuric acute on chronic kidney disease stage III. Possible diabetic nephropathy. Likely ATN Nephrology following with the patient. no urine output, No signs of recovery Appreciate input from nephrology.  Continue acute hemodialysis as needed per nephrology. TDC will be planned after amputation  5. Coronary artery disease Patient had elevated troponin initially, peak 0.34 Echocardiogram 50-55% EF, no wall motion abnormality, grade 1 diastolic dysfunction. As per earlier discussion with cardiology patient will need outpatient workup. It was thought most likely secondary to renal failure as well as demand ischemia.  6. Atrial fibrillation. Paroxysmal, currently normal sinus rhythm spontaneously [place] Vasc score elevated. Will discuss anticoagulation with the patient after amputation.  7. Bilateral shoulder pain. Currently have resolved. He may need MRI if recurrence as an outpatient  8. Essential  hypertension. Controlled, continue carvedilol, hydralazine  9. Type 2 diabetes mellitus, uncontrolled Hemoglobin A1c 14.2 December 16. Sugars are now well controlled Moderate sliding scale, 6 units AC coverage and 15 units lantus.  DVT Prophylaxis: subcutaneous Heparin Nutrition: RENAL carb modified diet Advance goals of care discussion: Full code  Brief Summary of Hospitalization:  HPI: As per the H and P dictated on admission, "David Ford is a 75 y.o. male   has a past medical history of Diabetes mellitus without complication ([place]) and Hypertension.   Presented with a fall onto the floor from the couch. He has communicated that has been down on the floor for past 3 days. He was not able to walk but could crawl and states could reach fridge and microwave so he ate and drank. He reports he was able able to call on the phone the entire time but did not request any help until today when he realized he was not getting any better. He has been living in [place] for past 20 years but has a PCP in [place]. He reports flying there every 3 months.  Patient lives at home alone he was found today by the friend who called EMS. Patient has history of diabetes but his glucose meter has been broken. Patient has been fatigued. Denies any loss of consciousness patient reports some file order from his toes. Reports generalized muscle aches. Reports loss of strength in arms and legs. Reports he was able to urinate today.  He is unsure if he has any history of kidney disease. Unsure if he's been taking his medications Emergency department he was found to have evidence of rhabdomyolysis with CKs 3053, his initial lab work showing creatinine of 4 potassium was 6.0 and an gap was 16. Patient was unable to produce urine. Troponin  slightly elevated to 0.05 albumin 2.9 White blood cell, was noted to be elevated 16.2 Hospitalist was called for admission for dehydration rhabdomyolysis evidence of acute renal  failure" Procedures: Echocardiogram 06/18/2015 50-55% ejection fraction. Hemodialysis catheter insertion 06/19/2015 Consultants: Nephrology, gastroenterology, general surgery, orthopedic surgery Dr. Sharol Given, critical care for dialysis catheter placement. Antibiotics: Anti-infectives    Start     Dose/Rate Route Frequency Ordered Stop   06/28/15 2200  piperacillin-tazobactam (ZOSYN) IVPB 2.25 g     2.25 g 100 mL/hr over 30 Minutes Intravenous 3 times per day 06/28/15 1737 07/02/15 0559   06/28/15 1945  piperacillin-tazobactam (ZOSYN) IVPB 2.25 g  Status:  Discontinued     2.25 g 100 mL/hr over 30 Minutes Intravenous Every 6 hours 06/28/15 1736 06/28/15 1737   06/23/15 0100  piperacillin-tazobactam (ZOSYN) IVPB 2.25 g  Status:  Discontinued     2.25 g 100 mL/hr over 30 Minutes Intravenous Every 8 hours 06/22/15 1912 06/28/15 1735   06/22/15 1600  vancomycin (VANCOCIN) IVPB 1000 mg/200 mL premix     1,000 mg 200 mL/hr over 60 Minutes Intravenous  Once 06/22/15 1448 06/22/15 1730   06/20/15 1400  vancomycin (VANCOCIN) IVPB 1000 mg/200 mL premix     1,000 mg 200 mL/hr over 60 Minutes Intravenous  Once 06/20/15 1205 06/20/15 1538   06/19/15 2100  vancomycin (VANCOCIN) IVPB 1000 mg/200 mL premix  Status:  Discontinued     1,000 mg 200 mL/hr over 60 Minutes Intravenous Every 48 hours 06/17/15 2214 06/18/15 0945   06/18/15 1200  vancomycin (VANCOCIN) IVPB 1000 mg/200 mL premix  Status:  Discontinued     1,000 mg 200 mL/hr over 60 Minutes Intravenous Every 48 hours 06/18/15 0945 06/20/15 1204   06/18/15 0530  piperacillin-tazobactam (ZOSYN) IVPB 2.25 g  Status:  Discontinued     2.25 g 100 mL/hr over 30 Minutes Intravenous Every 8 hours 06/17/15 2214 06/22/15 1912   06/17/15 2115  piperacillin-tazobactam (ZOSYN) IVPB 3.375 g     3.375 g 100 mL/hr over 30 Minutes Intravenous  Once 06/17/15 2111 06/17/15 2151   06/17/15 2115  vancomycin (VANCOCIN) IVPB 1000 mg/200 mL premix     1,000 mg 200  mL/hr over 60 Minutes Intravenous  Once 06/17/15 2111 06/18/15 0235      Family Communication: no family was present at bedside, at the time of interview.   Disposition: Barriers to safe discharge: Improvement in renal function, postoperative course with diabetic foot infection   Intake/Output Summary (Last 24 hours) at 06/30/15 1759 Last data filed at 06/30/15 1400  Gross per 24 hour  Intake    480 ml  Output   1978 ml  Net  -1498 ml   Filed Weights   06/28/15 1900 06/29/15 1437 06/29/15 1915  Weight: 100.9 kg (222 lb 7.1 oz) 97.4 kg (214 lb 11.7 oz) 95.5 kg (210 lb 8.6 oz)    Objective: Physical Exam: Filed Vitals:   06/29/15 2136 06/30/15 0531 06/30/15 0828 06/30/15 1715  BP: 125/74 115/60 147/54 139/57  Pulse: 88 92 89 91  Temp: 99.2 F (37.3 C) 99.3 F (37.4 C) 99.2 F (37.3 C) 99.1 F (37.3 C)  TempSrc: Oral Oral Oral Oral  Resp: 16 18 17 18   Height:      Weight:      SpO2: 99% 98% 100% 98%    General: Appear in mild distress, no Rash; Oral Mucosa moist. Cardiovascular: S1 and S2 Present, no Murmur,  Respiratory: Bilateral Air entry present and Clear to  Auscultation, no Crackles, no wheezes Abdomen: Bowel Sound present, Soft and no tenderness Extremities: no Pedal edema, no calf tenderness  Data Reviewed: CBC:  Recent Labs Lab 06/25/15 0248  06/27/15 0531 06/28/15 0509 06/29/15 0729 06/29/15 1505 06/30/15 0536  WBC 17.3*  < > 15.3* 17.3* 12.8* 12.0* 8.2  NEUTROABS 13.6*  --   --   --   --   --   --   HGB 9.8*  < > 8.0* 8.6* 8.3* 8.3* 7.8*  HCT 28.8*  < > 24.4* 26.5* 24.0* 24.5* 23.6*  MCV 83.0  < > 83.6 84.7 81.4 84.2 85.2  PLT 319  < > 249 316 364 369 293  < > = values in this interval not displayed. Basic Metabolic Panel:  Recent Labs Lab 06/26/15 0700 06/27/15 0531 06/28/15 0509 06/29/15 0729 06/30/15 0536  NA 129* 132* 130* 130* 133*  K 3.7 3.7 3.3* 3.3* 3.3*  CL 94* 98* 97* 96* 100*  CO2 21* 22 22 20* 25  GLUCOSE 243* 254* 120* 90  114*  BUN 69* 39* 53* 65* 27*  CREATININE 8.25* 6.04* 7.65* 9.94* 6.15*  CALCIUM 7.8* 7.4* 7.7* 7.7* 7.1*  PHOS 5.2* 4.4 5.8* 8.3* 5.4*   Liver Function Tests:  Recent Labs Lab 06/25/15 0248 06/26/15 0700 06/27/15 0531 06/28/15 0509 06/29/15 0729 06/30/15 0536  AST 18  --   --   --   --   --   ALT 18  --   --   --   --   --   ALKPHOS 81  --   --   --   --   --   BILITOT 0.9  --   --   --   --   --   PROT 5.8*  --   --   --   --   --   ALBUMIN 1.6*  1.7* 1.8* 1.7* 1.8* 1.7* 1.7*   CBG:  Recent Labs Lab 06/29/15 0744 06/29/15 1141 06/30/15 0823 06/30/15 1147 06/30/15 1711  GLUCAP 87 127* 156* 147* 86    Studies: No results found.   Scheduled Meds: . antiseptic oral rinse  7 mL Mouth Rinse q12n4p  . brimonidine  1 drop Both Eyes BID  . carvedilol  6.25 mg Oral BID WC  . chlorhexidine  15 mL Mouth Rinse BID  . darbepoetin (ARANESP) injection - DIALYSIS  100 mcg Intravenous Q Fri-HD  . feeding supplement (NEPRO CARB STEADY)  237 mL Oral TID BM  . fluticasone  1 spray Each Nare Daily  . gabapentin  300 mg Oral BID  . hydrALAZINE  100 mg Oral BID  . insulin aspart  0-15 Units Subcutaneous TID WC  . insulin aspart  0-5 Units Subcutaneous QHS  . insulin aspart  6 Units Subcutaneous TID WC  . insulin glargine  15 Units Subcutaneous Daily  . latanoprost  1 drop Both Eyes QHS  . pantoprazole  40 mg Oral BID  . piperacillin-tazobactam (ZOSYN)  IV  2.25 g Intravenous 3 times per day  . polyethylene glycol  17 g Oral Daily  . rosuvastatin  20 mg Oral Daily  . senna-docusate  1 tablet Oral BID  . sevelamer carbonate  1,600 mg Oral TID WC  . sodium chloride  3 mL Intravenous Q12H   Continuous Infusions:  PRN Meds: guaiFENesin, ondansetron (ZOFRAN) IV, oxyCODONE, traMADol, trazodone  Time spent: 30 minutes  Author: Berle Mull, MD [facility] Pager: [phone] 06/30/2015 5:59 PM  If 7PM-7AM, please  contact night-coverage at [email or web address], password [facility]

## 2015-06-30 NOTE — Progress Notes (Signed)
Patient ID: David Ford, male   DOB: 1940/12/12, 75 y.o.   MRN: [id number] Plan for surgery left foot amputation of left great toe and second toe with surgery on Thursday or Friday.

## 2015-06-30 NOTE — Progress Notes (Signed)
   06/30/15 1200  Clinical Encounter Type  Visited With Patient  Visit Type Follow-up  Spiritual Encounters  Spiritual Needs Emotional  Patient had indicated desire to sit in recliner and read, so I had taken him magazines. Then I stopped by today to find his reading glasses for him. Nurse indicated he would get PT eval today and then they would put him in recliner.

## 2015-06-30 NOTE — Progress Notes (Signed)
Occupational Therapy Treatment Patient Details Name: David Ford MRN: [id number] DOB: 07-21-1940 Today's Date: 06/30/2015    History of present illness 75 yo M with hx of DM, HTN Here with generalized weakness was found to have ARF most likely due to dehydration in the setting of ACEi and was found to have untreated diabetic foot ulcer. rhabdomyolysis . ARF. SBO. Awaiting ray amputation of L foot when medically stable.    OT comments  Pt completed grooming at sink level sitting due to balance deficits and demonstrates cognitive deficits. Pt is unsafe to d/c home alone and will require higher level care at SNF level. Pt with pending surg later this week per notes. OT to await updated orders after surgery.    Follow Up Recommendations  SNF;Supervision/Assistance - 24 hour    Equipment Recommendations  Other (comment)    Recommendations for Other Services      Precautions / Restrictions Precautions Precautions: Fall Precaution Comments: pending surg later this week for L nercrotic 1st and 2nd toe       Mobility Bed Mobility Overal bed mobility: Needs Assistance Bed Mobility: Supine to Sit     Supine to sit: Mod assist     General bed mobility comments: pt needed sequencing cues at mod (A) and reaching for therapist attempting to pull on therapist. Pt needed redirection to bed rails. Pt unable to complete task without therapist input verbally and physically  Transfers Overall transfer level: Needs assistance Equipment used: Rolling walker (2 wheeled) Transfers: Stand Pivot Transfers Sit to Stand: Mod assist         General transfer comment: Rw used to help with balance and safety with transfer    Balance           Standing balance support: Bilateral upper extremity supported;During functional activity Standing balance-Leahy Scale: Poor                     ADL Overall ADL's : Needs assistance/impaired     Grooming: Wash/dry face;Oral care;Minimal  assistance;Sitting Grooming Details (indicate cue type and reason): due to ataxic stand pivot completed in sitting for safety. Pt would required total +2 for safety                 Toilet Transfer: Moderate assistance;Stand-pivot             General ADL Comments: Pt demonstrates very ataxic movements with stand pivot so pt transfering to chair. pt needs mod cueing for safety with task. pt reports ataxic movement to be new.       Vision                     Perception     Praxis      Cognition   Behavior During Therapy: Flat affect Overall Cognitive Status: Impaired/Different from baseline Area of Impairment: Problem solving;Awareness;Safety/judgement     Memory: Decreased short-term memory    Safety/Judgement: Decreased awareness of safety;Decreased awareness of deficits Awareness: Emergent   General Comments: pt demonstrates cogntiive deficits with oral care    Extremity/Trunk Assessment               Exercises     Shoulder Instructions       General Comments      Pertinent Vitals/ Pain       Pain Assessment: No/denies pain  Home Living  Prior Functioning/Environment              Frequency Min 2X/week     Progress Toward Goals  OT Goals(current goals can now be found in the care plan section)  Progress towards OT goals: Progressing toward goals  Acute Rehab OT Goals Patient Stated Goal: get better OT Goal Formulation: With patient Time For Goal Achievement: 07/07/15 Potential to Achieve Goals: Good ADL Goals Pt Will Perform Grooming: with set-up;with supervision;sitting Pt Will Perform Upper Body Bathing: with set-up;with supervision;sitting Pt Will Perform Lower Body Bathing: with set-up;with supervision;sit to/from stand Pt Will Perform Upper Body Dressing: with set-up;with supervision;sitting Pt Will Transfer to Toilet: with supervision;ambulating;bedside  commode Pt Will Perform Toileting - Clothing Manipulation and hygiene: with supervision;sit to/from stand  Plan Discharge plan remains appropriate    Co-evaluation                 End of Session Equipment Utilized During Treatment: Gait belt;Rolling walker   Activity Tolerance Patient tolerated treatment well   Patient Left in chair;with call bell/phone within reach;with chair alarm set;Other (comment) (tech and RN notified pt in chair )   Nurse Communication Mobility status;Precautions        Time: 1030-1045 OT Time Calculation (min): 15 min  Charges: OT General Charges $OT Visit: 1 Procedure OT Treatments $Self Care/Home Management : 8-22 mins  Parke Poisson B 06/30/2015, 2:10 PM  Jeri Modena   OTR/L Pager: [phone] Office: [phone] .

## 2015-06-30 NOTE — Progress Notes (Signed)
[facility] Progress Note    Assessment/ Plan:   Background: 85M AAM with known renal insufficiency, unclear duration (creatinine 2.0 in 04/2015), mild hyperkalemia, admitted after having been down on ground for 3d with a creatinine of 4; on ACEi; with mild rhabdomyolysis; septic with diabetic foot ulcer/gangrene. Hospital course complicated by AF/RVR, GIB. Progressive AKI, oligoanuric. HD initiated 12/31 (R IJ temp cath 12/30)  ASSESSMENT/RECOMMENDATIONS  1. Anuric AKI on CKD3 (SCr 04/2015 was 2.0) (Probable diabetic nephropathy with baseline CKD3). Rhabdo/gangrenous foot w/sepsis/vol depletion/ACE PTA-->all led to ischemic ATN. s/p R IJ Temp HD cath 06/19/15 and started HD 12/31. Still negligible UOP. Foley out. Prn bladder scans. Remains HD dependent. - No evidence yet for any recovery --> HD today. - Will request conversion to tunneled catheter by VIR after amputation to prevent seeding of the permcath. Patient remains afebrile with improving WBC. - 4K bath in the am with 2.5L net UF in the am. 2. Dialysis access: R IJ temp cath (12/30) 3. Diabetic foot ulcer/gangrene of left 1st 2 toes - deferred until more stable per Dr. Sharol Given - he has been reconsulted? 4. Sepsis d/t #4. Vanco and zosyn 5. Partial SBO with partial small bowel malrotation. Resolved. Bowels moving. Tolerating diet. Surgery has signed off. 6. UGIB - Felt gastritis/asa/tube trauma/uremia. Hb variable last 4 days. On protonix. 7. Anemia - Fe def. Rec'd Feraheme 12/31. Added aranesp 100/week. Dosed 1/6. If no improvement after amputation (hopefully decreases inflammatory state) then will increase Aranesp at that time. Resistance bec of the gangrenous toe. 8. DM2 - ssi 9. HTN - meds 10. AFib with RVR - occurred with HD 12/31. BB for rate control  11. + trops. ? Demand ischemia. EF 50-55%  Subjective:   No complaints Eating, bowels moving, no abd pain Last HD 1/9 Still no urine output and  appears to be euvolemic.  Sleeping but easily arousable and very pleasant.   Objective:   BP 147/54 mmHg  Pulse 89  Temp(Src) 99.2 F (37.3 C) (Oral)  Resp 17  Ht 6\' 2"  (1.88 m)  Wt 95.5 kg (210 lb 8.6 oz)  BMI 27.02 kg/m2  SpO2 100%  Intake/Output Summary (Last 24 hours) at 06/30/15 1405 Last data filed at 06/30/15 1009  Gross per 24 hour  Intake    240 ml  Output   1978 ml  Net  -1738 ml   Weight change: -3.5 kg (-7 lb 11.5 oz)  Physical Exam: GEN: NAD, elderly male lying in bed.  Very pleasant CV: Irreg S1S2 with wide split S2 PULM: Ant clear. ABD: Mod abd distension. + BS. No focal tenderness  EXT:No edema.  Foot not examined (wrapped). R IJ Temp HD cath (12/30)   Imaging: No results found.  Labs: BMET  Recent Labs Lab 06/24/15 0530 06/25/15 0248 06/26/15 0700 06/27/15 0531 06/28/15 0509 06/29/15 0729 06/30/15 0536  NA 140 135  135 129* 132* 130* 130* 133*  K 4.3 3.7  3.7 3.7 3.7 3.3* 3.3* 3.3*  CL 100* 98*  98* 94* 98* 97* 96* 100*  CO2 20* 24  24 21* 22 22 20* 25  GLUCOSE 165* 289*  288* 243* 254* 120* 90 114*  BUN 87* 56*  56* 69* 39* 53* 65* 27*  CREATININE 9.59* 6.60*  6.46* 8.25* 6.04* 7.65* 9.94* 6.15*  CALCIUM 8.6* 8.0*  8.1* 7.8* 7.4* 7.7* 7.7* 7.1*  PHOS 7.8* 4.3 5.2* 4.4 5.8* 8.3* 5.4*   CBC  Recent Labs Lab 06/25/15 0248  06/28/15 0509  06/29/15 0729 06/29/15 1505 06/30/15 0536  WBC 17.3*  < > 17.3* 12.8* 12.0* 8.2  NEUTROABS 13.6*  --   --   --   --   --   HGB 9.8*  < > 8.6* 8.3* 8.3* 7.8*  HCT 28.8*  < > 26.5* 24.0* 24.5* 23.6*  MCV 83.0  < > 84.7 81.4 84.2 85.2  PLT 319  < > 316 364 369 293  < > = values in this interval not displayed.  Medications:    . antiseptic oral rinse  7 mL Mouth Rinse q12n4p  . brimonidine  1 drop Both Eyes BID  . carvedilol  6.25 mg Oral BID WC  . chlorhexidine  15 mL Mouth Rinse BID  . darbepoetin (ARANESP) injection - DIALYSIS  100 mcg Intravenous Q Fri-HD  . feeding  supplement (NEPRO CARB STEADY)  237 mL Oral TID BM  . fluticasone  1 spray Each Nare Daily  . gabapentin  300 mg Oral BID  . hydrALAZINE  100 mg Oral BID  . insulin aspart  0-15 Units Subcutaneous TID WC  . insulin aspart  0-5 Units Subcutaneous QHS  . insulin aspart  6 Units Subcutaneous TID WC  . insulin glargine  15 Units Subcutaneous Daily  . latanoprost  1 drop Both Eyes QHS  . pantoprazole  40 mg Oral BID  . piperacillin-tazobactam (ZOSYN)  IV  2.25 g Intravenous 3 times per day  . polyethylene glycol  17 g Oral Daily  . rosuvastatin  20 mg Oral Daily  . senna-docusate  1 tablet Oral BID  . sevelamer carbonate  1,600 mg Oral TID WC  . sodium chloride  3 mL Intravenous Q12H      Otelia Santee, MD 06/30/2015, 2:05 PM

## 2015-06-30 NOTE — Progress Notes (Signed)
Physical Therapy Treatment Patient Details Name: David Ford MRN: [id number] DOB: 08-27-1940 Today's Date: 06/30/2015    History of Present Illness 75 yo M with hx of DM, HTN Here with generalized weakness was found to have ARF most likely due to dehydration in the setting of ACEi and was found to have untreated diabetic foot ulcer. rhabdomyolysis . ARF. SBO. Awaiting ray amputation of L foot when medically stable.     PT Comments    Pt eager to ambulate and was able to tolerate 150' with RW. Pt remains to have impaired balance and generalized weakness. Pt with poor R shoulder ROM and stiff neck. Due to LE weakness and impaired balance pt requiring assist for all mobility. Pt is also planning on having L toe amputations this week. PT to con't to assess patient however currently recommending CIR upon d/c to achieve maximal functional recovery.   Follow Up Recommendations  CIR     Equipment Recommendations  Rolling walker with 5" wheels    Recommendations for Other Services Rehab consult     Precautions / Restrictions Precautions Precautions: Fall Precaution Comments: pending surg later this week for L nercrotic 1st and 2nd toe Restrictions Weight Bearing Restrictions: No    Mobility  Bed Mobility Overal bed mobility: Needs Assistance Bed Mobility: Supine to Sit Rolling: Min assist   Supine to sit: Mod assist Sit to supine: Min guard   General bed mobility comments: pt needed sequencing cues at mod (A) and reaching for therapist attempting to pull on therapist. Pt needed redirection to bed rails. Pt unable to complete task without therapist input verbally and physically  Transfers Overall transfer level: Needs assistance Equipment used: Rolling walker (2 wheeled) Transfers: Stand Pivot Transfers Sit to Stand: Mod assist         General transfer comment: Rw used to help with balance and safety with transfer  Ambulation/Gait Ambulation/Gait assistance: Min  assist Ambulation Distance (Feet): 150 Feet Assistive device: Rolling walker (2 wheeled) Gait Pattern/deviations: Step-through pattern;Wide base of support;Staggering right Gait velocity: decreased Gait velocity interpretation: Below normal speed for age/gender General Gait Details: drifting to the right, assist for walker managment, pt with noted LE weakness however compensated well with RW and offweightt LEs   Stairs            Wheelchair Mobility    Modified Rankin (Stroke Patients Only)       Balance Overall balance assessment: Needs assistance Sitting-balance support: Feet supported;No upper extremity supported Sitting balance-Leahy Scale: Fair     Standing balance support: Bilateral upper extremity supported;During functional activity Standing balance-Leahy Scale: Poor Standing balance comment: needs external support to maintain balance Single Leg Stance - Right Leg: able to hold for 2 sec but required assist Single Leg Stance - Left Leg:   (pt unable to maintain >1 second, requires max A to prevent fall                Cognition Arousal/Alertness: Awake/alert Behavior During Therapy: Flat affect Overall Cognitive Status: Impaired/Different from baseline Area of Impairment: Problem solving;Awareness;Safety/judgement     Memory: Decreased short-term memory   Safety/Judgement: Decreased awareness of safety;Decreased awareness of deficits Awareness: Emergent   General Comments: pt demonstrates cogntiive deficits with oral care    Exercises      General Comments        Pertinent Vitals/Pain Pain Assessment: No/denies pain Pain Score: 6  Pain Location: R shoulder and neck Pain Descriptors / Indicators: Aching Pain Intervention(s): Monitored during session  Home Living                      Prior Function            PT Goals (current goals can now be found in the care plan section) Acute Rehab PT Goals Patient Stated Goal: get  better Progress towards PT goals: Progressing toward goals    Frequency  Min 3X/week    PT Plan Discharge plan needs to be updated    Co-evaluation             End of Session Equipment Utilized During Treatment: Gait belt Activity Tolerance: Patient tolerated treatment well Patient left: with call bell/phone within reach;with SCD's reapplied;in bed;with bed alarm set     Time: 1208-1232 PT Time Calculation (min) (ACUTE ONLY): 24 min  Charges:  $Gait Training: 8-22 mins $Neuromuscular Re-education: 8-22 mins                    G Codes:      [name] 06/30/2015, 3:24 PM   [name], PT, DPT Pager #: [phone] Office #: [phone]

## 2015-07-01 LAB — RENAL FUNCTION PANEL
ANION GAP: 11 (ref 5–15)
Albumin: 1.6 g/dL — ABNORMAL LOW (ref 3.5–5.0)
BUN: 37 mg/dL — ABNORMAL HIGH (ref 6–20)
CHLORIDE: 95 mmol/L — AB (ref 101–111)
CO2: 23 mmol/L (ref 22–32)
CREATININE: 7.62 mg/dL — AB (ref 0.61–1.24)
Calcium: 7.5 mg/dL — ABNORMAL LOW (ref 8.9–10.3)
GFR, EST AFRICAN AMERICAN: 7 mL/min — AB (ref >60)
GFR, EST NON AFRICAN AMERICAN: 6 mL/min — AB (ref >60)
Glucose, Bld: 107 mg/dL — ABNORMAL HIGH (ref 65–99)
POTASSIUM: 3.1 mmol/L — AB (ref 3.5–5.1)
Phosphorus: 6.2 mg/dL — ABNORMAL HIGH (ref 2.5–4.6)
Sodium: 129 mmol/L — ABNORMAL LOW (ref 135–145)

## 2015-07-01 LAB — GLUCOSE, CAPILLARY
GLUCOSE-CAPILLARY: 228 mg/dL — AB (ref 65–99)
GLUCOSE-CAPILLARY: 140 mg/dL — AB (ref 65–99)
Glucose-Capillary: 129 mg/dL — ABNORMAL HIGH (ref 65–99)

## 2015-07-01 LAB — HEPATITIS B CORE ANTIBODY, TOTAL: Hep B Core Total Ab: POSITIVE — AB

## 2015-07-01 LAB — CBC
HEMATOCRIT: 21.9 % — AB (ref 39.0–52.0)
HEMOGLOBIN: 7.2 g/dL — AB (ref 13.0–17.0)
MCH: 28.1 pg (ref 26.0–34.0)
MCHC: 32.9 g/dL (ref 30.0–36.0)
MCV: 85.5 fL (ref 78.0–100.0)
PLATELETS: 307 10*3/uL (ref 150–400)
RBC: 2.56 MIL/uL — AB (ref 4.22–5.81)
RDW: 15.1 % (ref 11.5–15.5)
WBC: 7.6 10*3/uL (ref 4.0–10.5)

## 2015-07-01 LAB — HEPATITIS B SURFACE ANTIBODY,QUALITATIVE: Hep B S Ab: REACTIVE

## 2015-07-01 MED ORDER — BISACODYL 10 MG RE SUPP: 10.0000 mg | Freq: Once | RECTAL | Status: DC

## 2015-07-01 MED ORDER — LIDOCAINE HCL (PF) 1 % IJ SOLN: 5.0000 mL | INTRAMUSCULAR | Status: DC | PRN

## 2015-07-01 MED ORDER — MAGNESIUM CITRATE PO SOLN
1.0000 | Freq: Once | ORAL | Status: AC
  Administered 2015-07-01: 1 via ORAL
  Filled 2015-07-01: qty 296

## 2015-07-01 MED ORDER — SODIUM CHLORIDE 0.9 % IV SOLN: 100.0000 mL | INTRAVENOUS | Status: DC | PRN

## 2015-07-01 MED ORDER — NEPRO/CARBSTEADY PO LIQD
237.0000 mL | Freq: Two times a day (BID) | ORAL | Status: DC
  Administered 2015-07-02 – 2015-07-09 (×5): 237 mL via ORAL

## 2015-07-01 MED ORDER — ALTEPLASE 2 MG IJ SOLR: 2.0000 mg | Freq: Once | INTRAMUSCULAR | Status: DC | PRN

## 2015-07-01 MED ORDER — PENTAFLUOROPROP-TETRAFLUOROETH EX AERO: 1.0000 "application " | INHALATION_SPRAY | CUTANEOUS | Status: DC | PRN

## 2015-07-01 MED ORDER — HEPARIN SODIUM (PORCINE) 1000 UNIT/ML DIALYSIS: 1000.0000 [IU] | INTRAMUSCULAR | Status: DC | PRN

## 2015-07-01 MED ORDER — LIDOCAINE-PRILOCAINE 2.5-2.5 % EX CREA: 1.0000 "application " | TOPICAL_CREAM | CUTANEOUS | Status: DC | PRN

## 2015-07-01 MED ORDER — HEPARIN SODIUM (PORCINE) 1000 UNIT/ML DIALYSIS
4000.0000 [IU] | Freq: Once | INTRAMUSCULAR | Status: AC
  Administered 2015-07-01: 4000 [IU] via INTRAVENOUS_CENTRAL

## 2015-07-01 NOTE — Progress Notes (Addendum)
[facility] Progress Note   David Ford  [id number]  DOB: 12-Jun-1941  DOA: 06/17/2015 PCP: No primary care provider on file.  Brief narrative: David Ford is a 75 y.o. male with DM, HTN admitted after a fall from his couch and being on the floor for about 3 days. His found by a friend who then called EMS. The patient was noted to have rhabdomyolysis, glucose of 454, acute renal failure with a creatinine of 4 and a potassium of 6.0. WBC count was also elevated at 16.2. He was noted to have gangrenous toes on his left foot.   Subjective:   Assessment/Plan: Principal Problem:   Diabetic foot infection -Initially started on vancomycin and Zosyn-vancomycin discontinued -Plan per orthopedic surgeries to perform amputations of the first and second left toes later this week  Active Problems: Rhabdomyolysis -Steadily resolved  Acute renal failure --Started on dialysis treatments when first admitted and Continues to require dialysis -Nephrology team managing  Upper GI bleed -Suspected be secondary to NG tube insertion and gastritis  AOCD - follow Hb- Transfuse if < 7  Partial small bowel obstruction -Resolved with conservative management - abdomen quite distended today-patient states he is constipated -  will give Dulcolax suppository- if not results, will give Mag Citrate  Atrial fibrillation with RVR -Occurred on hemodialysis on 12/1 -2-D echo revealed EF of 50-55% and grade 1 diastolic dysfunction -TSH was normal at 0.653 on 12/31 - has not had any further episodes noted since then- will hold off on anticoagulation   CAD - mild elevation in Troponin- ECHO without WMA- cardiology following  DM2 - cont Lantus and Insulin  Essential HTN - coreg and Hydralazine   Antibiotics: Anti-infectives    Start     Dose/Rate Route Frequency Ordered Stop   06/28/15 2200  piperacillin-tazobactam (ZOSYN) IVPB 2.25 g     2.25 g 100 mL/hr over 30 Minutes Intravenous 3  times per day 06/28/15 1737 07/02/15 0559   06/28/15 1945  piperacillin-tazobactam (ZOSYN) IVPB 2.25 g  Status:  Discontinued     2.25 g 100 mL/hr over 30 Minutes Intravenous Every 6 hours 06/28/15 1736 06/28/15 1737   06/23/15 0100  piperacillin-tazobactam (ZOSYN) IVPB 2.25 g  Status:  Discontinued     2.25 g 100 mL/hr over 30 Minutes Intravenous Every 8 hours 06/22/15 1912 06/28/15 1735   06/22/15 1600  vancomycin (VANCOCIN) IVPB 1000 mg/200 mL premix     1,000 mg 200 mL/hr over 60 Minutes Intravenous  Once 06/22/15 1448 06/22/15 1730   06/20/15 1400  vancomycin (VANCOCIN) IVPB 1000 mg/200 mL premix     1,000 mg 200 mL/hr over 60 Minutes Intravenous  Once 06/20/15 1205 06/20/15 1538   06/19/15 2100  vancomycin (VANCOCIN) IVPB 1000 mg/200 mL premix  Status:  Discontinued     1,000 mg 200 mL/hr over 60 Minutes Intravenous Every 48 hours 06/17/15 2214 06/18/15 0945   06/18/15 1200  vancomycin (VANCOCIN) IVPB 1000 mg/200 mL premix  Status:  Discontinued     1,000 mg 200 mL/hr over 60 Minutes Intravenous Every 48 hours 06/18/15 0945 06/20/15 1204   06/18/15 0530  piperacillin-tazobactam (ZOSYN) IVPB 2.25 g  Status:  Discontinued     2.25 g 100 mL/hr over 30 Minutes Intravenous Every 8 hours 06/17/15 2214 06/22/15 1912   06/17/15 2115  piperacillin-tazobactam (ZOSYN) IVPB 3.375 g     3.375 g 100 mL/hr over 30 Minutes Intravenous  Once 06/17/15 2111 06/17/15 2151   06/17/15 2115  vancomycin (VANCOCIN) IVPB  1000 mg/200 mL premix     1,000 mg 200 mL/hr over 60 Minutes Intravenous  Once 06/17/15 2111 06/18/15 0235     Code Status:     Code Status Orders        Start     Ordered   06/17/15 2215  Full code   Continuous     06/17/15 2214    Code Status History    Date Active Date Inactive Code Status Order ID Comments User Context   This patient has a current code status but no historical code status.     Family Communication:  Disposition Plan: awaiting amputation DVT  prophylaxis: Heparin Consultants: Nephrology, Ortho, Cardiology  Procedures:      Objective: Filed Weights   06/30/15 2014 07/01/15 0700 07/01/15 1101  Weight: 95.2 kg (209 lb 14.1 oz) 100.1 kg (220 lb 10.9 oz) 97.3 kg (214 lb 8.1 oz)    Intake/Output Summary (Last 24 hours) at 07/01/15 1347 Last data filed at 07/01/15 1101  Gross per 24 hour  Intake    480 ml  Output   2486 ml  Net  -2006 ml     Vitals Filed Vitals:   07/01/15 1101 07/01/15 1102 07/01/15 1103 07/01/15 1215  BP: 144/73 118/55 106/51 148/58  Pulse: 87 103 112 88  Temp: 98.7 F (37.1 C)   98.5 F (36.9 C)  TempSrc: Oral   Oral  Resp: 18   18  Height:      Weight: 97.3 kg (214 lb 8.1 oz)     SpO2: 99%   100%    Exam:  General:  Pt is alert, not in acute distress  HEENT: No icterus, No thrush, oral mucosa moist  Cardiovascular: regular rate and rhythm, S1/S2 No murmur  Respiratory: clear to auscultation bilaterally   Abdomen: Soft, +Bowel sounds, non tender, non distended, no guarding  MSK: No cyanosis or clubbing- no pedal edema   Data Reviewed: Basic Metabolic Panel:  Recent Labs Lab 06/27/15 0531 06/28/15 0509 06/29/15 0729 06/30/15 0536 07/01/15 0455  NA 132* 130* 130* 133* 129*  K 3.7 3.3* 3.3* 3.3* 3.1*  CL 98* 97* 96* 100* 95*  CO2 22 22 20* 25 23  GLUCOSE 254* 120* 90 114* 107*  BUN 39* 53* 65* 27* 37*  CREATININE 6.04* 7.65* 9.94* 6.15* 7.62*  CALCIUM 7.4* 7.7* 7.7* 7.1* 7.5*  PHOS 4.4 5.8* 8.3* 5.4* 6.2*   Liver Function Tests:  Recent Labs Lab 06/25/15 0248  06/27/15 0531 06/28/15 0509 06/29/15 0729 06/30/15 0536 07/01/15 0455  AST 18  --   --   --   --   --   --   ALT 18  --   --   --   --   --   --   ALKPHOS 81  --   --   --   --   --   --   BILITOT 0.9  --   --   --   --   --   --   PROT 5.8*  --   --   --   --   --   --   ALBUMIN 1.6*  1.7*  < > 1.7* 1.8* 1.7* 1.7* 1.6*  < > = values in this interval not displayed. No results for input(s): LIPASE,  AMYLASE in the last 168 hours. No results for input(s): AMMONIA in the last 168 hours. CBC:  Recent Labs Lab 06/25/15 0248  06/28/15 0509 06/29/15 0729 06/29/15 1505  06/30/15 0536 07/01/15 0452  WBC 17.3*  < > 17.3* 12.8* 12.0* 8.2 7.6  NEUTROABS 13.6*  --   --   --   --   --   --   HGB 9.8*  < > 8.6* 8.3* 8.3* 7.8* 7.2*  HCT 28.8*  < > 26.5* 24.0* 24.5* 23.6* 21.9*  MCV 83.0  < > 84.7 81.4 84.2 85.2 85.5  PLT 319  < > 316 364 369 293 307  < > = values in this interval not displayed. Cardiac Enzymes: No results for input(s): CKTOTAL, CKMB, CKMBINDEX, TROPONINI in the last 168 hours. BNP (last 3 results) No results for input(s): BNP in the last 8760 hours.  ProBNP (last 3 results) No results for input(s): PROBNP in the last 8760 hours.  CBG:  Recent Labs Lab 06/30/15 0823 06/30/15 1147 06/30/15 1711 06/30/15 2013 07/01/15 1138  GLUCAP 156* 147* 86 109* 129*    No results found for this or any previous visit (from the past 240 hour(s)).   Studies: No results found.  Scheduled Meds:  Scheduled Meds: . antiseptic oral rinse  7 mL Mouth Rinse q12n4p  . brimonidine  1 drop Both Eyes BID  . carvedilol  6.25 mg Oral BID WC  . chlorhexidine  15 mL Mouth Rinse BID  . darbepoetin (ARANESP) injection - DIALYSIS  100 mcg Intravenous Q Fri-HD  . feeding supplement (NEPRO CARB STEADY)  237 mL Oral TID BM  . fluticasone  1 spray Each Nare Daily  . gabapentin  300 mg Oral BID  . hydrALAZINE  100 mg Oral BID  . insulin aspart  0-15 Units Subcutaneous TID WC  . insulin aspart  0-5 Units Subcutaneous QHS  . insulin aspart  6 Units Subcutaneous TID WC  . insulin glargine  15 Units Subcutaneous Daily  . latanoprost  1 drop Both Eyes QHS  . pantoprazole  40 mg Oral BID  . piperacillin-tazobactam (ZOSYN)  IV  2.25 g Intravenous 3 times per day  . polyethylene glycol  17 g Oral Daily  . rosuvastatin  20 mg Oral Daily  . senna-docusate  1 tablet Oral BID  . sevelamer carbonate   1,600 mg Oral TID WC  . sodium chloride  3 mL Intravenous Q12H   Continuous Infusions:   Time spent on care of this patient: 35 min   [place], MD 07/01/2015, 1:47 PM  LOS: 14 days   [facility] Office  [phone] Pager - Text Page per [email or web address] If 7PM-7AM, please contact night-coverage [email or web address]

## 2015-07-01 NOTE — Progress Notes (Signed)
Patient was seen on dialysis and the procedure was supervised.   BFR 400 Via temp RIJ  VSS.  AP -120  VP 100 UF 3L [place] 4/2.25  Patient appears to be tolerating treatment well. No change to therapy.  Otelia Santee, MD 07/01/2015, 9:18 AM

## 2015-07-01 NOTE — Progress Notes (Signed)
Nutrition Follow-up  DOCUMENTATION CODES:   Not applicable  INTERVENTION:  Provide Nepro Shake po BID, each supplement provides 425 kcal and 19 grams protein.  Encourage adequate PO intake.   NUTRITION DIAGNOSIS:   Increased nutrient needs related to wound healing as evidenced by estimated needs; ongoing  GOAL:   Patient will meet greater than or equal to 90% of their needs; met  MONITOR:   PO intake, Supplement acceptance, Skin, Weight trends, Labs, I & O's  REASON FOR ASSESSMENT:   Consult Assessment of nutrition requirement/status  ASSESSMENT:   75 yo M with hx of DM, HTN Here with generalized weakness was found to have ARF most likely due to dehydration in the setting of ACEi and was found to have untreated diabetic foot ulcer R IJ temp cath 12/30.HD initiated 12/31. Pt currently undergoing acute HD. Per Nephrology note, pt remains HD dependent with no evidence yet for recovery.   Meal completion has been 95-100%. Pt currently has Nepro Shake ordered TID and has been consuming most of them. RD to modify orders to BID as intake has improved. Plans for surgery later this week for amputations of the first and second L toes.   Phosphorous elevated at 6.2.  Diet Order:  Diet renal/carb modified with fluid restriction Diet-HS Snack?: Snack-yes; Room service appropriate?: Yes; Fluid consistency:: Thin; Fluid restriction:: 1200 mL Fluid  Skin:  Wound (see comment) (Diabetic ulcer L toe)  Last BM:  1/10  Height:   Ht Readings from Last 1 Encounters:  06/19/15 6' 2"  (1.88 m)    Weight:   Wt Readings from Last 1 Encounters:  07/01/15 214 lb 8.1 oz (97.3 kg)    Ideal Body Weight:  86.4 kg  BMI:  Body mass index is 27.53 kg/(m^2).  Estimated Nutritional Needs:   Kcal:  2400-2600  Protein:  115-125 grams  Fluid:  1.2 L/day  EDUCATION NEEDS:   No education needs identified at this time  Corrin Parker, MS, RD, LDN Pager # [phone] After hours/ weekend  pager # [phone]

## 2015-07-01 NOTE — Progress Notes (Signed)
Physical Therapy Treatment Patient Details Name: David Ford MRN: [id number] DOB: Oct 31, 1940 Today's Date: 07/01/2015    History of Present Illness 75 yo M with hx of DM, HTN Here with generalized weakness was found to have ARF most likely due to dehydration in the setting of ACEi and was found to have untreated diabetic foot ulcer. rhabdomyolysis . ARF. SBO. Awaiting ray amputation of L foot when medically stable.     PT Comments    Pt progressing towards goals but limited due to fatigue from HD this AM. Pt received with BM in bed, unaware of occurrence. Was able to stand throughout bed changing while relying on RW for support. Tolerated ambulating 251ft with RW yet  continuing to require VC's for safety and RW management. Continue to recommend CIR upon D/C, pending progress after surgery on Friday, to gain independence towards baseline level before returning home.    Follow Up Recommendations  CIR     Equipment Recommendations  Rolling walker with 5" wheels    Recommendations for Other Services       Precautions / Restrictions Precautions Precautions: Fall Precaution Comments: pending surg later this week for L nercrotic 1st and 2nd toe Restrictions Weight Bearing Restrictions: No    Mobility  Bed Mobility Overal bed mobility: Needs Assistance Bed Mobility: Supine to Sit;Sit to Supine     Supine to sit: Min assist Sit to supine: Min assist   General bed mobility comments: HOB Elevated VCs to push up on bed and railings  Transfers Overall transfer level: Needs assistance Equipment used: Rolling walker (2 wheeled) Transfers: Sit to/from [facility] Sit to Stand: Min assist Stand pivot transfers: Min assist       General transfer comment: RW used, VC's for hand placement on bed  Ambulation/Gait Ambulation/Gait assistance: Min assist Ambulation Distance (Feet): 200 Feet Assistive device: Rolling walker (2 wheeled) Gait Pattern/deviations:  Step-through pattern;Staggering left;Staggering right;Ataxic;Decreased stride length Gait velocity: decreased Gait velocity interpretation: Below normal speed for age/gender General Gait Details: multiple VCs to correct lateral drifting and for upright head posture. Increased shakiness   Stairs            Wheelchair Mobility    Modified Rankin (Stroke Patients Only)       Balance Overall balance assessment: Needs assistance Sitting-balance support: No upper extremity supported;Feet supported Sitting balance-Leahy Scale: Fair     Standing balance support: Bilateral upper extremity supported Standing balance-Leahy Scale: Poor Standing balance comment: Pt requring use of RW 100% of the time                    Cognition Arousal/Alertness: Awake/alert;Lethargic Behavior During Therapy: Flat affect Overall Cognitive Status: Impaired/Different from baseline Area of Impairment: Safety/judgement         Safety/Judgement: Decreased awareness of deficits;Decreased awareness of safety          Exercises      General Comments        Pertinent Vitals/Pain Pain Assessment: No/denies pain    Home Living                      Prior Function            PT Goals (current goals can now be found in the care plan section) Progress towards PT goals: Progressing toward goals    Frequency  Min 3X/week    PT Plan Current plan remains appropriate    Co-evaluation  End of Session Equipment Utilized During Treatment: Gait belt Activity Tolerance: Patient limited by fatigue Patient left: in bed;with call bell/phone within reach     Time: [id number] PT Time Calculation (min) (ACUTE ONLY): 32 min  Charges:                       G Codes:      Ara Kussmaul 2015-07-06, 4:44 PM  Ara Kussmaul, Student Physical Therapist Acute Rehab [phone]

## 2015-07-02 ENCOUNTER — Other Ambulatory Visit (HOSPITAL_COMMUNITY): Payer: Self-pay | Admitting: Orthopedic Surgery

## 2015-07-02 ENCOUNTER — Encounter (HOSPITAL_COMMUNITY): Payer: Self-pay | Admitting: Certified Registered Nurse Anesthetist

## 2015-07-02 DIAGNOSIS — I4891 Unspecified atrial fibrillation: Secondary | ICD-10-CM

## 2015-07-02 LAB — RENAL FUNCTION PANEL
ALBUMIN: 1.6 g/dL — AB (ref 3.5–5.0)
ANION GAP: 9 (ref 5–15)
BUN: 22 mg/dL — AB (ref 6–20)
CALCIUM: 7.6 mg/dL — AB (ref 8.9–10.3)
CO2: 27 mmol/L (ref 22–32)
CREATININE: 5.76 mg/dL — AB (ref 0.61–1.24)
Chloride: 100 mmol/L — ABNORMAL LOW (ref 101–111)
GFR calc Af Amer: 10 mL/min — ABNORMAL LOW (ref >60)
GFR calc non Af Amer: 9 mL/min — ABNORMAL LOW (ref >60)
GLUCOSE: 98 mg/dL (ref 65–99)
PHOSPHORUS: 5.1 mg/dL — AB (ref 2.5–4.6)
Potassium: 3.4 mmol/L — ABNORMAL LOW (ref 3.5–5.1)
SODIUM: 136 mmol/L (ref 135–145)

## 2015-07-02 LAB — BASIC METABOLIC PANEL
Anion gap: 9 (ref 5–15)
BUN: 21 mg/dL — AB (ref 6–20)
CALCIUM: 7.6 mg/dL — AB (ref 8.9–10.3)
CO2: 27 mmol/L (ref 22–32)
CREATININE: 5.77 mg/dL — AB (ref 0.61–1.24)
Chloride: 99 mmol/L — ABNORMAL LOW (ref 101–111)
GFR calc non Af Amer: 9 mL/min — ABNORMAL LOW (ref >60)
GFR, EST AFRICAN AMERICAN: 10 mL/min — AB (ref >60)
Glucose, Bld: 98 mg/dL (ref 65–99)
Potassium: 3.4 mmol/L — ABNORMAL LOW (ref 3.5–5.1)
SODIUM: 135 mmol/L (ref 135–145)

## 2015-07-02 LAB — GLUCOSE, CAPILLARY
GLUCOSE-CAPILLARY: 101 mg/dL — AB (ref 65–99)
GLUCOSE-CAPILLARY: 135 mg/dL — AB (ref 65–99)
Glucose-Capillary: 170 mg/dL — ABNORMAL HIGH (ref 65–99)
Glucose-Capillary: 120 mg/dL — ABNORMAL HIGH (ref 65–99)

## 2015-07-02 LAB — CBC
HCT: 20.9 % — ABNORMAL LOW (ref 39.0–52.0)
Hemoglobin: 6.6 g/dL — CL (ref 13.0–17.0)
MCH: 27 pg (ref 26.0–34.0)
MCHC: 31.6 g/dL (ref 30.0–36.0)
MCV: 85.7 fL (ref 78.0–100.0)
PLATELETS: 294 10*3/uL (ref 150–400)
RBC: 2.44 MIL/uL — ABNORMAL LOW (ref 4.22–5.81)
RDW: 15.2 % (ref 11.5–15.5)
WBC: 5.9 10*3/uL (ref 4.0–10.5)

## 2015-07-02 LAB — PREPARE RBC (CROSSMATCH)

## 2015-07-02 MED ORDER — FENTANYL CITRATE (PF) 250 MCG/5ML IJ SOLN
INTRAMUSCULAR | Status: AC
  Filled 2015-07-02: qty 5

## 2015-07-02 MED ORDER — MIDAZOLAM HCL 2 MG/2ML IJ SOLN
INTRAMUSCULAR | Status: AC
  Filled 2015-07-02: qty 2

## 2015-07-02 MED ORDER — SODIUM CHLORIDE 0.9 % IV SOLN
Freq: Once | INTRAVENOUS | Status: AC
  Administered 2015-07-02: 14:00:00 via INTRAVENOUS

## 2015-07-02 NOTE — Progress Notes (Signed)
[facility] Progress Note   David Ford  [id number]  DOB: [birth year]  DOA: 06/17/2015 PCP: No primary care provider on file.  Brief narrative: David Ford is a 75 y.o. male with DM, HTN admitted after a fall from his couch and being on the floor for about 3 days. His found by a friend who then called EMS. The patient was noted to have rhabdomyolysis, glucose of 454, acute renal failure with a creatinine of 4 and a potassium of 6.0. WBC count was also elevated at 16.2. He was noted to have gangrenous toes on his left foot.   Subjective: No c/o pain, nausea, vomiting, diarrhea, constipation or dyspnea.  Assessment/Plan: Principal Problem:   Diabetic foot infection -Initially started on vancomycin and Zosyn-vancomycin discontinued -Plan per orthopedic surgeries to perform amputations of the first and second left toes later this week  Active Problems: Rhabdomyolysis -Steadily resolved  Acute renal failure --Started on dialysis treatments when first admitted and Continues to require dialysis -Nephrology team managing  Upper GI bleed -Suspected be secondary to NG tube insertion and gastritis  AOCD - transfusing 2 U PRBC today for Hb of 6.6  Partial small bowel obstruction -Resolved with conservative management   Atrial fibrillation with RVR -Occurred on hemodialysis on 12/1 -2-D echo revealed EF of 50-55% and grade 1 diastolic dysfunction -TSH was normal at 0.653 on 12/31 - has not had any further episodes noted since then- will hold off on anticoagulation   CAD - mild elevation in Troponin- ECHO without WMA- cardiology following  DM2 - cont Lantus and Insulin  Essential HTN - coreg and Hydralazine   Antibiotics: Anti-infectives    Start     Dose/Rate Route Frequency Ordered Stop   06/28/15 2200  piperacillin-tazobactam (ZOSYN) IVPB 2.25 g     2.25 g 100 mL/hr over 30 Minutes Intravenous 3 times per day 06/28/15 1737 07/02/15 0559   06/28/15 1945   piperacillin-tazobactam (ZOSYN) IVPB 2.25 g  Status:  Discontinued     2.25 g 100 mL/hr over 30 Minutes Intravenous Every 6 hours 06/28/15 1736 06/28/15 1737   06/23/15 0100  piperacillin-tazobactam (ZOSYN) IVPB 2.25 g  Status:  Discontinued     2.25 g 100 mL/hr over 30 Minutes Intravenous Every 8 hours 06/22/15 1912 06/28/15 1735   06/22/15 1600  vancomycin (VANCOCIN) IVPB 1000 mg/200 mL premix     1,000 mg 200 mL/hr over 60 Minutes Intravenous  Once 06/22/15 1448 06/22/15 1730   06/20/15 1400  vancomycin (VANCOCIN) IVPB 1000 mg/200 mL premix     1,000 mg 200 mL/hr over 60 Minutes Intravenous  Once 06/20/15 1205 06/20/15 1538   06/19/15 2100  vancomycin (VANCOCIN) IVPB 1000 mg/200 mL premix  Status:  Discontinued     1,000 mg 200 mL/hr over 60 Minutes Intravenous Every 48 hours 06/17/15 2214 06/18/15 0945   06/18/15 1200  vancomycin (VANCOCIN) IVPB 1000 mg/200 mL premix  Status:  Discontinued     1,000 mg 200 mL/hr over 60 Minutes Intravenous Every 48 hours 06/18/15 0945 06/20/15 1204   06/18/15 0530  piperacillin-tazobactam (ZOSYN) IVPB 2.25 g  Status:  Discontinued     2.25 g 100 mL/hr over 30 Minutes Intravenous Every 8 hours 06/17/15 2214 06/22/15 1912   06/17/15 2115  piperacillin-tazobactam (ZOSYN) IVPB 3.375 g     3.375 g 100 mL/hr over 30 Minutes Intravenous  Once 06/17/15 2111 06/17/15 2151   06/17/15 2115  vancomycin (VANCOCIN) IVPB 1000 mg/200 mL premix     1,000 mg  200 mL/hr over 60 Minutes Intravenous  Once 06/17/15 2111 06/18/15 0235     Code Status:     Code Status Orders        Start     Ordered   06/17/15 2215  Full code   Continuous     06/17/15 2214    Code Status History    Date Active Date Inactive Code Status Order ID Comments User Context   This patient has a current code status but no historical code status.     Family Communication:  Disposition Plan: awaiting amputation DVT prophylaxis: Heparin Consultants: Nephrology, Ortho, Cardiology   Procedures:      Objective: Filed Weights   06/30/15 2014 07/01/15 0700 07/01/15 1101  Weight: 95.2 kg (209 lb 14.1 oz) 100.1 kg (220 lb 10.9 oz) 97.3 kg (214 lb 8.1 oz)    Intake/Output Summary (Last 24 hours) at 07/02/15 1349 Last data filed at 07/02/15 1111  Gross per 24 hour  Intake    720 ml  Output      0 ml  Net    720 ml     Vitals Filed Vitals:   07/01/15 1800 07/01/15 2142 07/02/15 0435 07/02/15 0802  BP: 127/49 133/49 137/53 129/55  Pulse: 86 77 80 81  Temp: 99.4 F (37.4 C) 99.5 F (37.5 C) 98.6 F (37 C) 98.2 F (36.8 C)  TempSrc: Oral Oral Oral Oral  Resp: 18 16 20 18   Height:      Weight:      SpO2: 99% 98% 98% 98%    Exam:  General:  Pt is alert, not in acute distress  HEENT: No icterus, No thrush, oral mucosa moist  Cardiovascular: regular rate and rhythm, S1/S2 No murmur  Respiratory: clear to auscultation bilaterally   Abdomen: Soft, +Bowel sounds, non tender, non distended, no guarding  MSK: No cyanosis or clubbing- no pedal edema- gangrene of left first and 2nd toe   Data Reviewed: Basic Metabolic Panel:  Recent Labs Lab 06/28/15 0509 06/29/15 0729 06/30/15 0536 07/01/15 0455 07/02/15 0630  NA 130* 130* 133* 129* 136  135  K 3.3* 3.3* 3.3* 3.1* 3.4*  3.4*  CL 97* 96* 100* 95* 100*  99*  CO2 22 20* 25 23 27  27   GLUCOSE 120* 90 114* 107* 98  98  BUN 53* 65* 27* 37* 22*  21*  CREATININE 7.65* 9.94* 6.15* 7.62* 5.76*  5.77*  CALCIUM 7.7* 7.7* 7.1* 7.5* 7.6*  7.6*  PHOS 5.8* 8.3* 5.4* 6.2* 5.1*   Liver Function Tests:  Recent Labs Lab 06/28/15 0509 06/29/15 0729 06/30/15 0536 07/01/15 0455 07/02/15 0630  ALBUMIN 1.8* 1.7* 1.7* 1.6* 1.6*   No results for input(s): LIPASE, AMYLASE in the last 168 hours. No results for input(s): AMMONIA in the last 168 hours. CBC:  Recent Labs Lab 06/29/15 0729 06/29/15 1505 06/30/15 0536 07/01/15 0452 07/02/15 0630  WBC 12.8* 12.0* 8.2 7.6 5.9  HGB 8.3* 8.3* 7.8*  7.2* 6.6*  HCT 24.0* 24.5* 23.6* 21.9* 20.9*  MCV 81.4 84.2 85.2 85.5 85.7  PLT 364 369 293 307 294   Cardiac Enzymes: No results for input(s): CKTOTAL, CKMB, CKMBINDEX, TROPONINI in the last 168 hours. BNP (last 3 results) No results for input(s): BNP in the last 8760 hours.  ProBNP (last 3 results) No results for input(s): PROBNP in the last 8760 hours.  CBG:  Recent Labs Lab 07/01/15 1138 07/01/15 1732 07/01/15 2140 07/02/15 0800 07/02/15 1215  GLUCAP 129* 228* 140*  101* 170*    No results found for this or any previous visit (from the past 240 hour(s)).   Studies: No results found.  Scheduled Meds:  Scheduled Meds: . sodium chloride   Intravenous Once  . antiseptic oral rinse  7 mL Mouth Rinse q12n4p  . bisacodyl  10 mg Rectal Once  . brimonidine  1 drop Both Eyes BID  . carvedilol  6.25 mg Oral BID WC  . chlorhexidine  15 mL Mouth Rinse BID  . darbepoetin (ARANESP) injection - DIALYSIS  100 mcg Intravenous Q Fri-HD  . feeding supplement (NEPRO CARB STEADY)  237 mL Oral BID BM  . fluticasone  1 spray Each Nare Daily  . gabapentin  300 mg Oral BID  . hydrALAZINE  100 mg Oral BID  . insulin aspart  0-15 Units Subcutaneous TID WC  . insulin aspart  0-5 Units Subcutaneous QHS  . insulin aspart  6 Units Subcutaneous TID WC  . insulin glargine  15 Units Subcutaneous Daily  . latanoprost  1 drop Both Eyes QHS  . pantoprazole  40 mg Oral BID  . polyethylene glycol  17 g Oral Daily  . rosuvastatin  20 mg Oral Daily  . senna-docusate  1 tablet Oral BID  . sevelamer carbonate  1,600 mg Oral TID WC  . sodium chloride  3 mL Intravenous Q12H   Continuous Infusions:   Time spent on care of this patient: 35 min   [place], MD 07/02/2015, 1:49 PM  LOS: 15 days   [facility] Office  [phone] Pager - Text Page per [email or web address] If 7PM-7AM, please contact night-coverage [email or web address]

## 2015-07-02 NOTE — Progress Notes (Signed)
[facility] Progress Note    Assessment/ Plan:   1. Anuric AKI on CKD3 (SCr 04/2015 was 2.0) (Probable diabetic nephropathy with baseline CKD3). Rhabdo/gangrenous foot w/sepsis/vol depletion/ACE PTA-->all led to ischemic ATN. s/p R IJ Temp HD cath 06/19/15 and started HD 12/31. Still negligible UOP. Foley out. Prn bladder scans. Remains HD dependent. - No evidence yet for any recovery --> HD tomorrow. - Will request conversion to tunneled catheter by VIR after amputation to prevent seeding of the permcath. Patient remains afebrile with improving WBC.  2. Dialysis access: R IJ temp cath (12/30) 3. Diabetic foot ulcer/gangrene of left 1st 2 toes - deferred until more stable per Dr. Sharol Given - he has been reconsulted? 4. Sepsis d/t #4. Vanco and zosyn 5. Partial SBO with partial small bowel malrotation. Resolved. Bowels moving. Tolerating diet. Surgery has signed off. 6. UGIB - Felt gastritis/asa/tube trauma/uremia. Hb variable last 4 days. On protonix. 7. Anemia - Fe def. Rec'd Feraheme 12/31. Added aranesp 100/week. Dosed 1/6. If no improvement after amputation (hopefully decreases inflammatory state) then will increase Aranesp at that time. Resistance bec of the gangrenous toe. 8. DM2 - ssi 9. HTN - meds 10. AFib with RVR - occurred with HD 12/31. BB for rate control  11. + trops. ? Demand ischemia. EF 50-55%  Subjective:   No complaints Eating, bowels moving, no abd pain Last HD 1/9 Still no urine output and appears to be euvolemic.   Objective:   BP 138/55 mmHg  Pulse 76  Temp(Src) 98.1 F (36.7 C) (Axillary)  Resp 18  Ht 6\' 2"  (1.88 m)  Wt 97.3 kg (214 lb 8.1 oz)  BMI 27.53 kg/m2  SpO2 99%  Intake/Output Summary (Last 24 hours) at 07/02/15 1507 Last data filed at 07/02/15 1455  Gross per 24 hour  Intake   1080 ml  Output      0 ml  Net   1080 ml   Weight change: 2.1 kg (4 lb 10.1 oz)  Physical Exam: GEN: NAD, elderly male lying in bed.   Very pleasant CV: Irreg S1S2 with wide split S2 PULM: Ant clear. ABD: Mod abd distension. + BS. No focal tenderness  EXT:No edema.  Foot not examined (wrapped). R IJ Temp HD cath (12/30)   Imaging: No results found.  Labs: BMET  Recent Labs Lab 06/26/15 0700 06/27/15 0531 06/28/15 0509 06/29/15 0729 06/30/15 0536 07/01/15 0455 07/02/15 0630  NA 129* 132* 130* 130* 133* 129* 136  135  K 3.7 3.7 3.3* 3.3* 3.3* 3.1* 3.4*  3.4*  CL 94* 98* 97* 96* 100* 95* 100*  99*  CO2 21* 22 22 20* 25 23 27  27   GLUCOSE 243* 254* 120* 90 114* 107* 98  98  BUN 69* 39* 53* 65* 27* 37* 22*  21*  CREATININE 8.25* 6.04* 7.65* 9.94* 6.15* 7.62* 5.76*  5.77*  CALCIUM 7.8* 7.4* 7.7* 7.7* 7.1* 7.5* 7.6*  7.6*  PHOS 5.2* 4.4 5.8* 8.3* 5.4* 6.2* 5.1*   CBC  Recent Labs Lab 06/29/15 1505 06/30/15 0536 07/01/15 0452 07/02/15 0630  WBC 12.0* 8.2 7.6 5.9  HGB 8.3* 7.8* 7.2* 6.6*  HCT 24.5* 23.6* 21.9* 20.9*  MCV 84.2 85.2 85.5 85.7  PLT 369 293 307 294    Medications:    . antiseptic oral rinse  7 mL Mouth Rinse q12n4p  . bisacodyl  10 mg Rectal Once  . brimonidine  1 drop Both Eyes BID  . carvedilol  6.25 mg Oral BID WC  .  chlorhexidine  15 mL Mouth Rinse BID  . darbepoetin (ARANESP) injection - DIALYSIS  100 mcg Intravenous Q Fri-HD  . feeding supplement (NEPRO CARB STEADY)  237 mL Oral BID BM  . fluticasone  1 spray Each Nare Daily  . gabapentin  300 mg Oral BID  . hydrALAZINE  100 mg Oral BID  . insulin aspart  0-15 Units Subcutaneous TID WC  . insulin aspart  0-5 Units Subcutaneous QHS  . insulin aspart  6 Units Subcutaneous TID WC  . insulin glargine  15 Units Subcutaneous Daily  . latanoprost  1 drop Both Eyes QHS  . pantoprazole  40 mg Oral BID  . polyethylene glycol  17 g Oral Daily  . rosuvastatin  20 mg Oral Daily  . senna-docusate  1 tablet Oral BID  . sevelamer carbonate  1,600 mg Oral TID WC  . sodium chloride  3 mL Intravenous Q12H      Otelia Santee, MD 07/02/2015, 3:07 PM

## 2015-07-02 NOTE — Care Management Important Message (Signed)
Important Message  Patient Details  Name: David Ford MRN: [id number] Date of Birth: [birth year]   Medicare Important Message Given:  Yes    Barb Merino Lilu Mcglown 07/02/2015, 1:41 PM

## 2015-07-02 NOTE — H&P (View-Only) (Signed)
Patient ID: David Ford, male   DOB: 04/15/1941, 75 y.o.   MRN: [id number] Plan for surgery left foot amputation of left great toe and second toe with surgery on Thursday or Friday.

## 2015-07-02 NOTE — Interval H&P Note (Signed)
History and Physical Interval Note:  07/02/2015 6:46 AM  David Ford  has presented today for surgery, with the diagnosis of Gangrene Left Foot  The various methods of treatment have been discussed with the patient and family. After consideration of risks, benefits and other options for treatment, the patient has consented to  Procedure(s): Left Foot 1st and 2nd Ray Amputation (Left) as a surgical intervention .  The patient's history has been reviewed, patient examined, no change in status, stable for surgery.  I have reviewed the patient's chart and labs.  Questions were answered to the patient's satisfaction.     DUDA,MARCUS V

## 2015-07-02 NOTE — Anesthesia Preprocedure Evaluation (Addendum)
Anesthesia Evaluation  Patient identified by MRN, date of birth, ID band Patient awake    Reviewed: Allergy & Precautions, H&P , NPO status , Patient's Chart, lab work & pertinent test results  History of Anesthesia Complications Negative for: history of anesthetic complications  Airway Mallampati: II  TM Distance: >3 FB     Dental no notable dental hx. (+) Teeth Intact, Dental Advisory Given   Pulmonary neg pulmonary ROS,    Pulmonary exam normal        Cardiovascular hypertension, Pt. on medications and Pt. on home beta blockers Normal cardiovascular exam     Neuro/Psych negative neurological ROS  negative psych ROS   GI/Hepatic negative GI ROS, Neg liver ROS,   Endo/Other  diabetes, Type 2, Oral Hypoglycemic Agents  Renal/GU Renal disease  negative genitourinary   Musculoskeletal   Abdominal   Peds  Hematology negative hematology ROS (+)   Anesthesia Other Findings   Reproductive/Obstetrics negative OB ROS                           Anesthesia Physical Anesthesia Plan  ASA: III  Anesthesia Plan: MAC   Post-op Pain Management:    Induction: Intravenous  Airway Management Planned:   Additional Equipment:   Intra-op Plan:   Post-operative Plan:   Informed Consent: I have reviewed the patients History and Physical, chart, labs and discussed the procedure including the risks, benefits and alternatives for the proposed anesthesia with the patient or authorized representative who has indicated his/her understanding and acceptance.   Dental advisory given  Plan Discussed with: CRNA and Anesthesiologist  Anesthesia Plan Comments:        Anesthesia Quick Evaluation

## 2015-07-03 ENCOUNTER — Inpatient Hospital Stay (HOSPITAL_COMMUNITY): Payer: Medicare HMO | Admitting: Anesthesiology

## 2015-07-03 ENCOUNTER — Encounter (HOSPITAL_COMMUNITY): Payer: Self-pay | Admitting: Certified Registered"

## 2015-07-03 ENCOUNTER — Encounter (HOSPITAL_COMMUNITY)
Admission: EM | Disposition: A | Discharge status: Skilled Nursing Facility | Payer: Self-pay | Source: Home / Self Care | Attending: Internal Medicine

## 2015-07-03 HISTORY — PX: AMPUTATION: SHX166

## 2015-07-03 LAB — GLUCOSE, CAPILLARY
GLUCOSE-CAPILLARY: 104 mg/dL — AB (ref 65–99)
Glucose-Capillary: 205 mg/dL — ABNORMAL HIGH (ref 65–99)
Glucose-Capillary: 94 mg/dL (ref 65–99)
Glucose-Capillary: 120 mg/dL — ABNORMAL HIGH (ref 65–99)

## 2015-07-03 LAB — RENAL FUNCTION PANEL
Albumin: 1.7 g/dL — ABNORMAL LOW (ref 3.5–5.0)
Anion gap: 10 (ref 5–15)
BUN: 28 mg/dL — AB (ref 6–20)
CALCIUM: 7.8 mg/dL — AB (ref 8.9–10.3)
CHLORIDE: 98 mmol/L — AB (ref 101–111)
CO2: 24 mmol/L (ref 22–32)
Creatinine, Ser: 7.69 mg/dL — ABNORMAL HIGH (ref 0.61–1.24)
GFR calc Af Amer: 7 mL/min — ABNORMAL LOW (ref >60)
GFR calc non Af Amer: 6 mL/min — ABNORMAL LOW (ref >60)
GLUCOSE: 66 mg/dL (ref 65–99)
POTASSIUM: 3.2 mmol/L — AB (ref 3.5–5.1)
Phosphorus: 5.6 mg/dL — ABNORMAL HIGH (ref 2.5–4.6)
Sodium: 132 mmol/L — ABNORMAL LOW (ref 135–145)

## 2015-07-03 LAB — TYPE AND SCREEN
ABO/RH(D): O POS
ANTIBODY SCREEN: NEGATIVE
UNIT DIVISION: 0
UNIT DIVISION: 0

## 2015-07-03 LAB — CBC
HEMATOCRIT: 26.5 % — AB (ref 39.0–52.0)
HEMOGLOBIN: 8.9 g/dL — AB (ref 13.0–17.0)
MCH: 28.3 pg (ref 26.0–34.0)
MCHC: 33.6 g/dL (ref 30.0–36.0)
MCV: 84.4 fL (ref 78.0–100.0)
Platelets: 281 10*3/uL (ref 150–400)
RBC: 3.14 MIL/uL — ABNORMAL LOW (ref 4.22–5.81)
RDW: 15.2 % (ref 11.5–15.5)
WBC: 7.7 10*3/uL (ref 4.0–10.5)

## 2015-07-03 LAB — SURGICAL PCR SCREEN
MRSA, PCR: NEGATIVE
STAPHYLOCOCCUS AUREUS: NEGATIVE

## 2015-07-03 LAB — HEMOGLOBIN AND HEMATOCRIT, BLOOD
HCT: 26 % — ABNORMAL LOW (ref 39.0–52.0)
Hemoglobin: 8.6 g/dL — ABNORMAL LOW (ref 13.0–17.0)

## 2015-07-03 SURGERY — AMPUTATION, FOOT, RAY
Anesthesia: Monitor Anesthesia Care | Laterality: Left

## 2015-07-03 MED ORDER — SODIUM CHLORIDE 0.9 % IV SOLN: 100.0000 mL | INTRAVENOUS | Status: DC | PRN

## 2015-07-03 MED ORDER — LIDOCAINE HCL (CARDIAC) 20 MG/ML IV SOLN
INTRAVENOUS | Status: DC | PRN
  Administered 2015-07-03: 40 mg via INTRAVENOUS

## 2015-07-03 MED ORDER — LIDOCAINE HCL (PF) 1 % IJ SOLN: 5.0000 mL | INTRAMUSCULAR | Status: DC | PRN

## 2015-07-03 MED ORDER — HYDROMORPHONE HCL 1 MG/ML IJ SOLN: 0.2500 mg | INTRAMUSCULAR | Status: DC | PRN

## 2015-07-03 MED ORDER — BUPIVACAINE HCL (PF) 0.25 % IJ SOLN
INTRAMUSCULAR | Status: DC | PRN
  Administered 2015-07-03: 10 mL

## 2015-07-03 MED ORDER — BUPIVACAINE HCL (PF) 0.25 % IJ SOLN
INTRAMUSCULAR | Status: AC
  Filled 2015-07-03: qty 30

## 2015-07-03 MED ORDER — HYDROMORPHONE HCL 1 MG/ML IJ SOLN
1.0000 mg | INTRAMUSCULAR | Status: DC | PRN
  Administered 2015-07-08: 1 mg via INTRAVENOUS
  Filled 2015-07-03: qty 1

## 2015-07-03 MED ORDER — METOCLOPRAMIDE HCL 5 MG/ML IJ SOLN: 5.0000 mg | Freq: Three times a day (TID) | INTRAMUSCULAR | Status: DC | PRN

## 2015-07-03 MED ORDER — DARBEPOETIN ALFA 100 MCG/0.5ML IJ SOSY
PREFILLED_SYRINGE | INTRAMUSCULAR | Status: AC
Start: 2015-07-03 — End: 2015-07-03
  Administered 2015-07-03: 100 ug
  Filled 2015-07-03: qty 0.5

## 2015-07-03 MED ORDER — FENTANYL CITRATE (PF) 100 MCG/2ML IJ SOLN
INTRAMUSCULAR | Status: DC | PRN
  Administered 2015-07-03: 100 ug via INTRAVENOUS
  Administered 2015-07-03: 50 ug via INTRAVENOUS

## 2015-07-03 MED ORDER — FENTANYL CITRATE (PF) 250 MCG/5ML IJ SOLN
INTRAMUSCULAR | Status: AC
  Filled 2015-07-03: qty 5

## 2015-07-03 MED ORDER — ACETAMINOPHEN 650 MG RE SUPP: 650.0000 mg | Freq: Four times a day (QID) | RECTAL | Status: DC | PRN

## 2015-07-03 MED ORDER — ACETAMINOPHEN 325 MG PO TABS
650.0000 mg | ORAL_TABLET | Freq: Four times a day (QID) | ORAL | Status: DC | PRN
  Administered 2015-07-09 – 2015-07-11 (×2): 650 mg via ORAL
  Filled 2015-07-03 (×2): qty 2

## 2015-07-03 MED ORDER — ONDANSETRON HCL 4 MG PO TABS: 4.0000 mg | ORAL_TABLET | Freq: Four times a day (QID) | ORAL | Status: DC | PRN

## 2015-07-03 MED ORDER — 0.9 % SODIUM CHLORIDE (POUR BTL) OPTIME
TOPICAL | Status: DC | PRN
  Administered 2015-07-03: 1000 mL

## 2015-07-03 MED ORDER — HEPARIN SODIUM (PORCINE) 1000 UNIT/ML DIALYSIS: 4000.0000 [IU] | Freq: Once | INTRAMUSCULAR | Status: DC

## 2015-07-03 MED ORDER — METHOCARBAMOL 500 MG PO TABS: 500.0000 mg | ORAL_TABLET | Freq: Four times a day (QID) | ORAL | Status: DC | PRN

## 2015-07-03 MED ORDER — PENTAFLUOROPROP-TETRAFLUOROETH EX AERO: 1.0000 "application " | INHALATION_SPRAY | CUTANEOUS | Status: DC | PRN

## 2015-07-03 MED ORDER — CEFAZOLIN SODIUM-DEXTROSE 2-3 GM-% IV SOLR
INTRAVENOUS | Status: DC | PRN
  Administered 2015-07-03: 2 g via INTRAVENOUS

## 2015-07-03 MED ORDER — CEFAZOLIN SODIUM 1-5 GM-% IV SOLN
1.0000 g | Freq: Four times a day (QID) | INTRAVENOUS | Status: AC
  Administered 2015-07-04 (×3): 1 g via INTRAVENOUS
  Filled 2015-07-03 (×3): qty 50

## 2015-07-03 MED ORDER — ONDANSETRON HCL 4 MG/2ML IJ SOLN
INTRAMUSCULAR | Status: AC
Start: 2015-07-03 — End: 2015-07-03
  Filled 2015-07-03: qty 2

## 2015-07-03 MED ORDER — METOCLOPRAMIDE HCL 5 MG PO TABS: 5.0000 mg | ORAL_TABLET | Freq: Three times a day (TID) | ORAL | Status: DC | PRN

## 2015-07-03 MED ORDER — ONDANSETRON HCL 4 MG/2ML IJ SOLN: 4.0000 mg | Freq: Four times a day (QID) | INTRAMUSCULAR | Status: DC | PRN

## 2015-07-03 MED ORDER — PROPOFOL 10 MG/ML IV BOLUS
INTRAVENOUS | Status: AC
  Filled 2015-07-03: qty 20

## 2015-07-03 MED ORDER — METHOCARBAMOL 1000 MG/10ML IJ SOLN: 500.0000 mg | Freq: Four times a day (QID) | INTRAMUSCULAR | Status: DC | PRN

## 2015-07-03 MED ORDER — LIDOCAINE-PRILOCAINE 2.5-2.5 % EX CREA: 1.0000 "application " | TOPICAL_CREAM | CUTANEOUS | Status: DC | PRN

## 2015-07-03 MED ORDER — OXYCODONE HCL 5 MG PO TABS: 5.0000 mg | ORAL_TABLET | ORAL | Status: DC | PRN

## 2015-07-03 MED ORDER — PROPOFOL 10 MG/ML IV BOLUS
INTRAVENOUS | Status: DC | PRN
Start: 2015-07-03 — End: 2015-07-03
  Administered 2015-07-03: 20 mg via INTRAVENOUS

## 2015-07-03 MED ORDER — SODIUM CHLORIDE 0.9 % IV SOLN
INTRAVENOUS | Status: DC
  Administered 2015-07-03 – 2015-07-14 (×2): via INTRAVENOUS

## 2015-07-03 MED ORDER — PROMETHAZINE HCL 25 MG/ML IJ SOLN: 6.2500 mg | INTRAMUSCULAR | Status: DC | PRN

## 2015-07-03 MED ORDER — ALTEPLASE 2 MG IJ SOLR
2.0000 mg | Freq: Once | INTRAMUSCULAR | Status: DC | PRN
  Filled 2015-07-03: qty 2

## 2015-07-03 MED ORDER — SODIUM CHLORIDE 0.9 % IV SOLN
INTRAVENOUS | Status: DC
  Administered 2015-07-03 (×2): via INTRAVENOUS

## 2015-07-03 SURGICAL SUPPLY — 30 items
BLADE SAW SGTL MED 73X18.5 STR (BLADE) IMPLANT
BNDG COHESIVE 4X5 TAN STRL (GAUZE/BANDAGES/DRESSINGS) ×3 IMPLANT
BNDG GAUZE ELAST 4 BULKY (GAUZE/BANDAGES/DRESSINGS) ×3 IMPLANT
COVER SURGICAL LIGHT HANDLE (MISCELLANEOUS) ×6 IMPLANT
DRAPE U-SHAPE 47X51 STRL (DRAPES) ×6 IMPLANT
DRSG ADAPTIC 3X8 NADH LF (GAUZE/BANDAGES/DRESSINGS) ×3 IMPLANT
DRSG PAD ABDOMINAL 8X10 ST (GAUZE/BANDAGES/DRESSINGS) ×3 IMPLANT
DURAPREP 26ML APPLICATOR (WOUND CARE) ×3 IMPLANT
ELECT REM PT RETURN 9FT ADLT (ELECTROSURGICAL) ×3
ELECTRODE REM PT RTRN 9FT ADLT (ELECTROSURGICAL) ×1 IMPLANT
GAUZE SPONGE 4X4 12PLY STRL (GAUZE/BANDAGES/DRESSINGS) ×3 IMPLANT
GLOVE BIOGEL PI IND STRL 9 (GLOVE) ×1 IMPLANT
GLOVE BIOGEL PI INDICATOR 9 (GLOVE) ×2
GLOVE SURG ORTHO 9.0 STRL STRW (GLOVE) ×3 IMPLANT
GOWN STRL REUS W/ TWL LRG LVL3 (GOWN DISPOSABLE) ×1 IMPLANT
GOWN STRL REUS W/ TWL XL LVL3 (GOWN DISPOSABLE) ×2 IMPLANT
GOWN STRL REUS W/TWL LRG LVL3 (GOWN DISPOSABLE) ×2
GOWN STRL REUS W/TWL XL LVL3 (GOWN DISPOSABLE) ×4
KIT BASIN OR (CUSTOM PROCEDURE TRAY) ×3 IMPLANT
KIT ROOM TURNOVER OR (KITS) ×3 IMPLANT
NS IRRIG 1000ML POUR BTL (IV SOLUTION) ×3 IMPLANT
PACK ORTHO EXTREMITY (CUSTOM PROCEDURE TRAY) ×3 IMPLANT
PAD ARMBOARD 7.5X6 YLW CONV (MISCELLANEOUS) ×6 IMPLANT
SPONGE LAP 18X18 X RAY DECT (DISPOSABLE) ×3 IMPLANT
STOCKINETTE IMPERVIOUS LG (DRAPES) IMPLANT
SUT ETHILON 2 0 PSLX (SUTURE) ×6 IMPLANT
TOWEL OR 17X24 6PK STRL BLUE (TOWEL DISPOSABLE) ×3 IMPLANT
TOWEL OR 17X26 10 PK STRL BLUE (TOWEL DISPOSABLE) ×3 IMPLANT
UNDERPAD 30X30 INCONTINENT (UNDERPADS AND DIAPERS) ×3 IMPLANT
WATER STERILE IRR 1000ML POUR (IV SOLUTION) ×3 IMPLANT

## 2015-07-03 NOTE — Progress Notes (Signed)
[facility] Progress Note   David Ford  [id number]  DOB: 1940-11-05  DOA: 06/17/2015 PCP: No primary care provider on file.  Brief narrative: David Ford is a 75 y.o. male with DM, HTN admitted after a fall from his couch and being on the floor for about 3 days. His found by a friend who then called EMS. The patient was noted to have rhabdomyolysis, glucose of 454, acute renal failure with a creatinine of 4 and a potassium of 6.0. WBC count was also elevated at 16.2. He was noted to have gangrenous toes on his left foot.   Subjective: No c/o pain, nausea, vomiting, diarrhea, constipation or dyspnea. Awaiting surgery today.  Assessment/Plan: Principal Problem:   Diabetic foot infection -Initially started on vancomycin and Zosyn-vancomycin discontinued -Plan per orthopedic surgeries to perform amputations of the first and second left toes today  Active Problems: Rhabdomyolysis -Steadily resolved  Acute renal failure --Started on dialysis treatments when first admitted and Continues to require dialysis -Nephrology team managing  Upper GI bleed -Suspected be secondary to NG tube insertion and gastritis  AOCD - transfusing 2 U PRBC today for Hb of 6.6  Partial small bowel obstruction -Resolved with conservative management   Atrial fibrillation with RVR -Occurred on hemodialysis on 12/1 -2-D echo revealed EF of 50-55% and grade 1 diastolic dysfunction -TSH was normal at 0.653 on 12/31 - has not had any further episodes noted since then- will hold off on anticoagulation   CAD - mild elevation in Troponin- ECHO without WMA- cardiology following  DM2 - cont Lantus and Insulin  Essential HTN - coreg and Hydralazine   Antibiotics: Anti-infectives    Start     Dose/Rate Route Frequency Ordered Stop   06/28/15 2200  piperacillin-tazobactam (ZOSYN) IVPB 2.25 g     2.25 g 100 mL/hr over 30 Minutes Intravenous 3 times per day 06/28/15 1737 07/02/15 0559   06/28/15 1945  piperacillin-tazobactam (ZOSYN) IVPB 2.25 g  Status:  Discontinued     2.25 g 100 mL/hr over 30 Minutes Intravenous Every 6 hours 06/28/15 1736 06/28/15 1737   06/23/15 0100  piperacillin-tazobactam (ZOSYN) IVPB 2.25 g  Status:  Discontinued     2.25 g 100 mL/hr over 30 Minutes Intravenous Every 8 hours 06/22/15 1912 06/28/15 1735   06/22/15 1600  vancomycin (VANCOCIN) IVPB 1000 mg/200 mL premix     1,000 mg 200 mL/hr over 60 Minutes Intravenous  Once 06/22/15 1448 06/22/15 1730   06/20/15 1400  vancomycin (VANCOCIN) IVPB 1000 mg/200 mL premix     1,000 mg 200 mL/hr over 60 Minutes Intravenous  Once 06/20/15 1205 06/20/15 1538   06/19/15 2100  vancomycin (VANCOCIN) IVPB 1000 mg/200 mL premix  Status:  Discontinued     1,000 mg 200 mL/hr over 60 Minutes Intravenous Every 48 hours 06/17/15 2214 06/18/15 0945   06/18/15 1200  vancomycin (VANCOCIN) IVPB 1000 mg/200 mL premix  Status:  Discontinued     1,000 mg 200 mL/hr over 60 Minutes Intravenous Every 48 hours 06/18/15 0945 06/20/15 1204   06/18/15 0530  piperacillin-tazobactam (ZOSYN) IVPB 2.25 g  Status:  Discontinued     2.25 g 100 mL/hr over 30 Minutes Intravenous Every 8 hours 06/17/15 2214 06/22/15 1912   06/17/15 2115  piperacillin-tazobactam (ZOSYN) IVPB 3.375 g     3.375 g 100 mL/hr over 30 Minutes Intravenous  Once 06/17/15 2111 06/17/15 2151   06/17/15 2115  vancomycin (VANCOCIN) IVPB 1000 mg/200 mL premix     1,000 mg  200 mL/hr over 60 Minutes Intravenous  Once 06/17/15 2111 06/18/15 0235     Code Status:     Code Status Orders        Start     Ordered   06/17/15 2215  Full code   Continuous     06/17/15 2214    Code Status History    Date Active Date Inactive Code Status Order ID Comments User Context   This patient has a current code status but no historical code status.     Family Communication:  Disposition Plan: awaiting amputation DVT prophylaxis: Heparin Consultants: Nephrology, Ortho,  Cardiology  Procedures:      Objective: Filed Weights   07/02/15 2100 07/03/15 0656 07/03/15 1100  Weight: 99.7 kg (219 lb 12.8 oz) 99.5 kg (219 lb 5.7 oz) 96.5 kg (212 lb 11.9 oz)    Intake/Output Summary (Last 24 hours) at 07/03/15 1308 Last data filed at 07/03/15 1100  Gross per 24 hour  Intake   1235 ml  Output   2502 ml  Net  -1267 ml     Vitals Filed Vitals:   07/03/15 0930 07/03/15 1000 07/03/15 1030 07/03/15 1100  BP: 106/70 132/74 132/65 139/72  Pulse: 93 80 86 81  Temp:    98.1 F (36.7 C)  TempSrc:    Oral  Resp:    18  Height:      Weight:    96.5 kg (212 lb 11.9 oz)  SpO2:    98%    Exam:  General:  Pt is alert, not in acute distress  HEENT: No icterus, No thrush, oral mucosa moist  Cardiovascular: regular rate and rhythm, S1/S2 No murmur  Respiratory: clear to auscultation bilaterally   Abdomen: Soft, +Bowel sounds, non tender, non distended, no guarding  MSK: No cyanosis or clubbing- no pedal edema- gangrene of left first and 2nd toe   Data Reviewed: Basic Metabolic Panel:  Recent Labs Lab 06/29/15 0729 06/30/15 0536 07/01/15 0455 07/02/15 0630 07/03/15 0635  NA 130* 133* 129* 136  135 132*  K 3.3* 3.3* 3.1* 3.4*  3.4* 3.2*  CL 96* 100* 95* 100*  99* 98*  CO2 20* 25 23 27  27 24   GLUCOSE 90 114* 107* 98  98 66  BUN 65* 27* 37* 22*  21* 28*  CREATININE 9.94* 6.15* 7.62* 5.76*  5.77* 7.69*  CALCIUM 7.7* 7.1* 7.5* 7.6*  7.6* 7.8*  PHOS 8.3* 5.4* 6.2* 5.1* 5.6*   Liver Function Tests:  Recent Labs Lab 06/29/15 0729 06/30/15 0536 07/01/15 0455 07/02/15 0630 07/03/15 0635  ALBUMIN 1.7* 1.7* 1.6* 1.6* 1.7*   No results for input(s): LIPASE, AMYLASE in the last 168 hours. No results for input(s): AMMONIA in the last 168 hours. CBC:  Recent Labs Lab 06/29/15 1505 06/30/15 0536 07/01/15 0452 07/02/15 0630 07/02/15 2303 07/03/15 0635  WBC 12.0* 8.2 7.6 5.9  --  7.7  HGB 8.3* 7.8* 7.2* 6.6* 8.6* 8.9*  HCT 24.5*  23.6* 21.9* 20.9* 26.0* 26.5*  MCV 84.2 85.2 85.5 85.7  --  84.4  PLT 369 293 307 294  --  281   Cardiac Enzymes: No results for input(s): CKTOTAL, CKMB, CKMBINDEX, TROPONINI in the last 168 hours. BNP (last 3 results) No results for input(s): BNP in the last 8760 hours.  ProBNP (last 3 results) No results for input(s): PROBNP in the last 8760 hours.  CBG:  Recent Labs Lab 07/02/15 0800 07/02/15 1215 07/02/15 1714 07/02/15 2117 07/03/15 1202  GLUCAP  101* 170* 120* 135* 205*    No results found for this or any previous visit (from the past 240 hour(s)).   Studies: No results found.  Scheduled Meds:  Scheduled Meds: . antiseptic oral rinse  7 mL Mouth Rinse q12n4p  . bisacodyl  10 mg Rectal Once  . brimonidine  1 drop Both Eyes BID  . carvedilol  6.25 mg Oral BID WC  . chlorhexidine  15 mL Mouth Rinse BID  . darbepoetin (ARANESP) injection - DIALYSIS  100 mcg Intravenous Q Fri-HD  . feeding supplement (NEPRO CARB STEADY)  237 mL Oral BID BM  . fluticasone  1 spray Each Nare Daily  . gabapentin  300 mg Oral BID  . hydrALAZINE  100 mg Oral BID  . insulin aspart  0-15 Units Subcutaneous TID WC  . insulin aspart  0-5 Units Subcutaneous QHS  . insulin aspart  6 Units Subcutaneous TID WC  . insulin glargine  15 Units Subcutaneous Daily  . latanoprost  1 drop Both Eyes QHS  . pantoprazole  40 mg Oral BID  . polyethylene glycol  17 g Oral Daily  . rosuvastatin  20 mg Oral Daily  . senna-docusate  1 tablet Oral BID  . sevelamer carbonate  1,600 mg Oral TID WC  . sodium chloride  3 mL Intravenous Q12H   Continuous Infusions:   Time spent on care of this patient: 35 min   [place], MD 07/03/2015, 1:08 PM  LOS: 16 days   [facility] Office  [phone] Pager - Text Page per [email or web address] If 7PM-7AM, please contact night-coverage [email or web address]

## 2015-07-03 NOTE — Progress Notes (Signed)
[facility] Progress Note    Assessment/ Plan:   1. Anuric AKI on CKD3 (SCr 04/2015 was 2.0) (Probable diabetic nephropathy with baseline CKD3). Rhabdo/gangrenous foot w/sepsis/vol depletion/ACE PTA-->all led to ischemic ATN. s/p R IJ Temp HD cath 06/19/15 and started HD 12/31. Still negligible UOP. Foley out. Prn bladder scans. Remains HD dependent. - No evidence yet for any recovery --> tolerated HD today but clotting issues bec no heparin for surgery today. - Will request conversion to tunneled catheter by VIR after amputation to prevent seeding of the permcath. Patient remains afebrile with improving WBC --> order placed today but ok to place Sat or early next week.  2. Dialysis access: R IJ temp cath (12/30) 3. Diabetic foot ulcer/gangrene of left 1st 2 toes - deferred until more stable per Dr. Sharol Given - he has been reconsulted? 4. Sepsis d/t #4. Vanco and zosyn 5. Partial SBO with partial small bowel malrotation. Resolved. Bowels moving. Tolerating diet. Surgery has signed off. 6. UGIB - Felt gastritis/asa/tube trauma/uremia. Hb variable last 4 days. On protonix. 7. Anemia - Fe def. Rec'd Feraheme 12/31. Added aranesp 100/week. Dosed 1/6. If no improvement after amputation (hopefully decreases inflammatory state) then will increase Aranesp at that time. Resistance bec of the gangrenous toe. 8. DM2 - ssi 9. HTN - meds 10. AFib with RVR - occurred with HD 12/31. BB for rate control  11. + trops. ? Demand ischemia. EF 50-55%  Subjective:   No complaints Eating, bowels moving, no abd pain Just finished HD today Still no urine output and appears to be euvolemic.   Objective:   BP 139/72 mmHg  Pulse 81  Temp(Src) 98.1 F (36.7 C) (Oral)  Resp 18  Ht 6\' 2"  (1.88 m)  Wt 96.5 kg (212 lb 11.9 oz)  BMI 27.30 kg/m2  SpO2 98%  Intake/Output Summary (Last 24 hours) at 07/03/15 1139 Last data filed at 07/03/15 1100  Gross per 24 hour  Intake   1235 ml   Output   2502 ml  Net  -1267 ml   Weight change: 2.4 kg (5 lb 4.7 oz)  Physical Exam: GEN: NAD, elderly male lying in bed.  Very pleasant CV: Irreg S1S2 with wide split S2 PULM: Ant clear. ABD: Mod abd distension. + BS. No focal tenderness  EXT:No edema.  Foot not examined (wrapped). R IJ Temp HD cath (12/30)   Imaging: No results found.  Labs: BMET  Recent Labs Lab 06/27/15 0531 06/28/15 0509 06/29/15 0729 06/30/15 0536 07/01/15 0455 07/02/15 0630 07/03/15 0635  NA 132* 130* 130* 133* 129* 136  135 132*  K 3.7 3.3* 3.3* 3.3* 3.1* 3.4*  3.4* 3.2*  CL 98* 97* 96* 100* 95* 100*  99* 98*  CO2 22 22 20* 25 23 27  27 24   GLUCOSE 254* 120* 90 114* 107* 98  98 66  BUN 39* 53* 65* 27* 37* 22*  21* 28*  CREATININE 6.04* 7.65* 9.94* 6.15* 7.62* 5.76*  5.77* 7.69*  CALCIUM 7.4* 7.7* 7.7* 7.1* 7.5* 7.6*  7.6* 7.8*  PHOS 4.4 5.8* 8.3* 5.4* 6.2* 5.1* 5.6*   CBC  Recent Labs Lab 06/30/15 0536 07/01/15 0452 07/02/15 0630 07/02/15 2303 07/03/15 0635  WBC 8.2 7.6 5.9  --  7.7  HGB 7.8* 7.2* 6.6* 8.6* 8.9*  HCT 23.6* 21.9* 20.9* 26.0* 26.5*  MCV 85.2 85.5 85.7  --  84.4  PLT 293 307 294  --  281    Medications:    . antiseptic oral  rinse  7 mL Mouth Rinse q12n4p  . bisacodyl  10 mg Rectal Once  . brimonidine  1 drop Both Eyes BID  . carvedilol  6.25 mg Oral BID WC  . chlorhexidine  15 mL Mouth Rinse BID  . darbepoetin (ARANESP) injection - DIALYSIS  100 mcg Intravenous Q Fri-HD  . feeding supplement (NEPRO CARB STEADY)  237 mL Oral BID BM  . fluticasone  1 spray Each Nare Daily  . gabapentin  300 mg Oral BID  . hydrALAZINE  100 mg Oral BID  . insulin aspart  0-15 Units Subcutaneous TID WC  . insulin aspart  0-5 Units Subcutaneous QHS  . insulin aspart  6 Units Subcutaneous TID WC  . insulin glargine  15 Units Subcutaneous Daily  . latanoprost  1 drop Both Eyes QHS  . pantoprazole  40 mg Oral BID  . polyethylene glycol  17 g Oral Daily  .  rosuvastatin  20 mg Oral Daily  . senna-docusate  1 tablet Oral BID  . sevelamer carbonate  1,600 mg Oral TID WC  . sodium chloride  3 mL Intravenous Q12H      Otelia Santee, MD 07/03/2015, 11:39 AM

## 2015-07-03 NOTE — Op Note (Addendum)
06/17/2015 - 07/03/2015  7:28 PM  PATIENT:  David Ford    PRE-OPERATIVE DIAGNOSIS:  Gangrene Left Foot  POST-OPERATIVE DIAGNOSIS:  Same   PROCEDURE:  Left Foot 1st and 2nd Ray Amputation Local tissue rearrangement for wound closure 4 x 8 cm  SURGEON:  DUDA,MARCUS V, MD  PHYSICIAN ASSISTANT:None ANESTHESIA:   General  PREOPERATIVE INDICATIONS:  [name] is a  75 y.o. male with a diagnosis of Gangrene Left Foot who failed conservative measures and elected for surgical management.    The risks benefits and alternatives were discussed with the patient preoperatively including but not limited to the risks of infection, bleeding, nerve injury, cardiopulmonary complications, the need for revision surgery, among others, and the patient was willing to proceed.  OPERATIVE IMPLANTS: None  OPERATIVE FINDINGS: No deep abscess  OPERATIVE PROCEDURE: Patient brought the operating room and underwent a Mack anesthetic. After adequate levels anesthesia obtained patient's left lower extremity was prepped using DuraPrep draped into a sterile field. A timeout was called. The area was locally anesthetized with 10 mL of quarter percent Marcaine plain. After adequate levels anesthesia obtained a racquet incision was made around the necrotic great toe and second toe this was carried down the first ray was resected through the base of the first metatarsal and the second ray was resected to the mid shaft. The necrotic toes and rays were resected in 1 block of tissue. The wound was irrigated with normal saline. Electrocautery was used for hemostasis. Local tissue rearrangement was performed to close a wound 4 x 8 cm. The wound was closed with 2-0 nylon. A sterile compressive dressing was applied. Patient was taken to the PACU in stable condition.

## 2015-07-03 NOTE — Interval H&P Note (Signed)
History and Physical Interval Note:  07/03/2015 6:30 AM  David Ford  has presented today for surgery, with the diagnosis of Gangrene Left Foot  The various methods of treatment have been discussed with the patient and family. After consideration of risks, benefits and other options for treatment, the patient has consented to  Procedure(s): Left Foot 1st and 2nd Ray Amputation (Left) as a surgical intervention .  The patient's history has been reviewed, patient examined, no change in status, stable for surgery.  I have reviewed the patient's chart and labs.  Questions were answered to the patient's satisfaction.     DUDA,MARCUS V

## 2015-07-03 NOTE — Transfer of Care (Signed)
Immediate Anesthesia Transfer of Care Note  Patient: David Ford  Procedure(s) Performed: Procedure(s): Left Foot 1st and 2nd Ray Amputation (Left)  Patient Location: PACU  Anesthesia Type:MAC  Level of Consciousness: awake, alert  and oriented  Airway & Oxygen Therapy: Patient connected to nasal cannula oxygen  Post-op Assessment: Report given to RN and Post -op Vital signs reviewed and stable  Post vital signs: Reviewed and stable  Last Vitals:  Filed Vitals:   07/03/15 1100 07/03/15 1946  BP: 139/72   Pulse: 81   Temp: 36.7 C 36.9 C  Resp: 18     Complications: No apparent anesthesia complications

## 2015-07-03 NOTE — Progress Notes (Signed)
PT Cancellation Note  Patient Details Name: David Ford MRN: [id number] DOB: 1940/08/28   Cancelled Treatment:    Reason Eval/Treat Not Completed: Patient at procedure or test/unavailable Patient to OR for Left Foot 1st and 2nd Ray Amputation. Will follow-up.   [name] 07/03/2015, 8:08 AM  Elayne Snare, [place]

## 2015-07-03 NOTE — Progress Notes (Signed)
Orthopedic Tech Progress Note Patient Details:  David Ford 1940-11-28 [id number]  Ortho Devices Type of Ortho Device: Postop shoe/boot Ortho Device/Splint Location: LLE Ortho Device/Splint Interventions: Ordered, Application   Braulio Bosch 07/03/2015, 11:01 PM

## 2015-07-03 NOTE — Consult Note (Signed)
Chief Complaint: Patient was seen in consultation today for tunneled dialysis catheter placement Chief Complaint  Patient presents with  . Shoulder Pain   at the request of Dr Augustin Coupe  Referring Physician(s): Dr Augustin Coupe  History of Present Illness: David Ford is a 75 y.o. male   Anuric acute kidney injury/ CKD3 R IJ Temp cath placed with CCM 12/30 Started dialysis 12/31 No recovery noted Still wothout much UOP Now needs more permanent access  Gangrenous Left foot/osteomyelits Diabetes  Scheduled for left toe(s) amputation in OR today  Request for placement of tunneled catheter per Dr Augustin Coupe Will plan for conversion of temporary dialysis catheter conversion to tunneled catheter Vs new tunneled catheter placement for Mon 1/16 in IR   Past Medical History  Diagnosis Date  . Diabetes mellitus without complication ([place])   . Hypertension   . DKA (diabetic ketoacidoses) ([place]) 06/18/2015  . AKI (acute kidney injury) ([place]) 06/18/2015  . Cancer ([place])     hx of prostate cancer    Past Surgical History  Procedure Laterality Date  . Prostate seeds      Allergies: Review of patient's allergies indicates no known allergies.  Medications: Prior to Admission medications   Medication Sig Start Date End Date Taking? Authorizing Provider  amLODipine (NORVASC) 10 MG tablet Take 10 mg by mouth daily.   Yes Historical Provider, MD  brimonidine (ALPHAGAN P) 0.1 % SOLN Place 1 drop into both eyes 2 (two) times daily.   Yes Historical Provider, MD  carvedilol (COREG) 6.25 MG tablet Take 6.25 mg by mouth 2 (two) times daily with a meal.   Yes Historical Provider, MD  ezetimibe (ZETIA) 10 MG tablet Take 10 mg by mouth daily.   Yes Historical Provider, MD  fluticasone (FLONASE) 50 MCG/ACT nasal spray Place 1 spray into both nostrils daily.   Yes Historical Provider, MD  gabapentin (NEURONTIN) 300 MG capsule Take 300 mg by mouth 2 (two) times daily.    Yes Historical Provider, MD  glimepiride  (AMARYL) 2 MG tablet Take 2 mg by mouth daily with breakfast.   Yes Historical Provider, MD  hydrALAZINE (APRESOLINE) 100 MG tablet Take 100 mg by mouth 2 (two) times daily.   Yes Historical Provider, MD  linagliptin (TRADJENTA) 5 MG TABS tablet Take 5 mg by mouth daily.   Yes Historical Provider, MD  lisinopril (PRINIVIL,ZESTRIL) 5 MG tablet Take 5 mg by mouth daily.   Yes Historical Provider, MD  rosuvastatin (CRESTOR) 20 MG tablet Take 20 mg by mouth daily.   Yes Historical Provider, MD  traMADol (ULTRAM) 50 MG tablet Take 50 mg by mouth every 6 (six) hours as needed for severe pain.   Yes Historical Provider, MD  Travoprost, BAK Free, (TRAVATAN) 0.004 % SOLN ophthalmic solution Place 1 drop into both eyes at bedtime.   Yes Historical Provider, MD  trazodone (DESYREL) 300 MG tablet Take 300 mg by mouth at bedtime.   Yes Historical Provider, MD     Family History  Problem Relation Age of Onset  . Diabetes Mother   . Diabetes Sister   . Diabetes Brother     Social History   Social History  . Marital Status: Married    Spouse Name: N/A  . Number of Children: N/A  . Years of Education: N/A   Social History Main Topics  . Smoking status: Never Smoker   . Smokeless tobacco: Never Used  . Alcohol Use: Yes     Comment: once a week  .  Drug Use: No  . Sexual Activity: Not Asked   Other Topics Concern  . None   Social History Narrative     Review of Systems: A 12 point ROS discussed and pertinent positives are indicated in the HPI above.  All other systems are negative.  Review of Systems  Constitutional: Positive for activity change and appetite change. Negative for fever and fatigue.  Respiratory: Negative for shortness of breath.   Genitourinary: Positive for decreased urine volume.  Neurological: Negative for weakness.  Psychiatric/Behavioral: Negative for behavioral problems and confusion.    Vital Signs: BP 139/72 mmHg  Pulse 81  Temp(Src) 98.1 F (36.7 C) (Oral)   Resp 18  Ht 6\' 2"  (1.88 m)  Wt 212 lb 11.9 oz (96.5 kg)  BMI 27.30 kg/m2  SpO2 98%  Physical Exam  Constitutional: He is oriented to person, place, and time.  Cardiovascular: Normal rate, regular rhythm and normal heart sounds.   Pulmonary/Chest: Effort normal and breath sounds normal.  Abdominal: Soft. Bowel sounds are normal.  Musculoskeletal: Normal range of motion.  Left foot gangrene  Neurological: He is alert and oriented to person, place, and time.  Skin: Skin is warm and dry.  Psychiatric: He has a normal mood and affect. His behavior is normal. Judgment and thought content normal.  Nursing note and vitals reviewed.   Mallampati Score:  MD Evaluation Airway: WNL Heart: WNL Abdomen: WNL Chest/ Lungs: WNL ASA  Classification: 3 Mallampati/Airway Score: One  Imaging: Ct Abdomen Pelvis Wo Contrast  06/19/2015  CLINICAL DATA:  8 YOM PRESENTS WITH SMALL BOWEL OBSTRUCTION EXAM: CT ABDOMEN AND PELVIS WITHOUT CONTRAST TECHNIQUE: Multidetector CT imaging of the abdomen and pelvis was performed following the standard protocol without IV contrast. COMPARISON:  06/19/2015 plain films FINDINGS: Lower chest: Bilateral small pleural effusions and bibasilar atelectasis. Nasogastric tube in place. Hiatal hernia noted. Upper abdomen: Scalloped contour of the liver noted. No focal abnormality identified within the liver, spleen, pancreas. Adrenal glands are diffusely mildly prominent. No focal lesions are identified however. No focal abnormality identified within the kidneys. No hydronephrosis. Gallbladder is present. Gastrointestinal tract: Stomach is mildly distended and contains contrast. The ligament of Treitz is inferiorly positioned, consistent partial malrotation. The proximal small bowel loops are dilated with mild tapering in the left mid abdomen to normal caliber. The distal small bowel loops are normal in caliber. The appendix has a normal appearance. Small amounts of ascites. There  is abnormal appearance of the cecum. There is a small amount of contrast in the cecal tip. However, the wall of the ascending colon appears very thickened and there is associated stranding within the mesentery. There is thickening of the right peritoneal reflection adjacent to the colon. The findings are suspicious for neoplasm. Other considerations include typhlitis or colitis. Contrast is seen beyond this level to normal appearing hepatic flexure and distal colon. Pelvis: Urinary bladder contains Foley catheter. Prostatic seeds are noted. Seminal vesicles have a normal appearance. There is moderate stool within the sigmoid and rectum. No free pelvic fluid. Retroperitoneum: There is moderate atherosclerosis of the abdominal aorta. No retroperitoneal or mesenteric adenopathy. Abdominal wall: Small left inguinal hernia contains ascites. Osseous structures: Mild degenerative changes are seen in the spine. IMPRESSION: 1. Partial small bowel obstruction, best localized to the left mid abdomen. 2. Abnormal appearance of the cecum and ascending colon. Findings are suspicious for malignancy. Other considerations include typhlitis or colitis. 3. Small bilateral pleural effusions.  Hiatal hernia. 4. Cirrhotic contour of the liver.  5. Partial malrotation of the small bowel. 6. Prostatic seeds. 7. Abdominal aortic atherosclerosis. Electronically Signed   By: Nolon Nations M.D.   On: 06/19/2015 17:46   Dg Chest 2 View  06/17/2015  CLINICAL DATA:  Weakness. EXAM: CHEST  2 VIEW COMPARISON:  None. FINDINGS: Trachea is midline. Heart size within normal limits. Lungs are clear. No pleural fluid. Degenerative changes are seen in the spine. IMPRESSION: No acute findings. Electronically Signed   By: Lorin Picket M.D.   On: 06/17/2015 18:10   Dg Abd 1 View  06/17/2015  CLINICAL DATA:  Abdominal distention EXAM: ABDOMEN - 1 VIEW COMPARISON:  None. FINDINGS: Brachytherapy seeds overlie the prostate. There mildly dilated  small bowel loops in the central abdomen measuring up to 3.8 cm in diameter. Mild stool is seen throughout the colon. No evidence of pneumatosis or pneumoperitoneum. No pathologic soft tissue calcifications. IMPRESSION: Mildly dilated small bowel loops in the central abdomen, for which the differential includes a mid to distal small bowel obstruction or adynamic ileus. Electronically Signed   By: Ilona Sorrel M.D.   On: 06/17/2015 21:12   US Renal  06/18/2015  CLINICAL DATA:  Acute onset of renal failure.  Initial encounter. EXAM: RENAL / URINARY TRACT ULTRASOUND COMPLETE COMPARISON:  None. FINDINGS: Right Kidney: Length: 10.2 cm. Increased parenchymal echogenicity noted. A small 1.1 cm cyst is noted at the interpole region of the right kidney. No hydronephrosis visualized. Left Kidney: Length: 11.9 cm. Mildly increased parenchymal echogenicity noted. No mass or hydronephrosis visualized. Bladder: Decompressed and not well assessed. IMPRESSION: 1. No evidence of hydronephrosis. 2. Increased renal parenchymal echogenicity likely reflects medical renal disease. 3. Small right renal cyst noted. Electronically Signed   By: Garald Balding M.D.   On: 06/18/2015 00:41   Dg Chest Port 1 View  06/19/2015  CLINICAL DATA:  Nasogastric tube and hemodialysis catheter placement EXAM: PORTABLE CHEST 1 VIEW COMPARISON:  Portable exam 1551 hours compared to 06/17/2015 FINDINGS: Nasogastric tube present extending into stomach. RIGHT jugular central venous catheter with tip projecting over SVC. Borderline enlargement of cardiac silhouette with slight vascular congestion. Atherosclerotic calcification at aortic arch. Lungs clear. No pleural effusion or pneumothorax. IMPRESSION: No acute abnormalities. Electronically Signed   By: Lavonia Dana M.D.   On: 06/19/2015 16:11   Dg Shoulder Right Port  06/18/2015  CLINICAL DATA:  Patient with diffuse right shoulder pain. No known injury. EXAM: PORTABLE RIGHT SHOULDER - 2+ VIEW  COMPARISON:  Chest radiograph 06/17/2015 FINDINGS: Limited evaluation due to inability to position patient. No evidence for displaced fracture. AC joint degenerative changes. Visualized right hemi thorax is unremarkable. IMPRESSION: Limited exam due to difficulty with patient positioning. Within the above limitation, no evidence for displaced fracture. Recommend evaluation with standard radiographic views when patient clinically able. Electronically Signed   By: Lovey Newcomer M.D.   On: 06/18/2015 13:28   Dg Abd Portable 1v-small Bowel Obstruction Protocol-initial, 8 Hr Delay  06/23/2015  CLINICAL DATA:  75 year old male with a history of small bowel obstruction. EXAM: PORTABLE ABDOMEN - 1 VIEW COMPARISON:  Multiple prior plain film, most recent 06/23/2015, 06/22/2015, 06/21/2015, 06/20/2015, CT 06/19/2015 FINDINGS: Gastric tube projects over the lower mediastinum and terminates in the region of the stomach, unchanged. There has been progression of enteric contrast to the distal small bowel and colon. The opacified terminal ileum and length of the colon a decompressed. Small bowel loops within the mid and left abdomen are persistently borderline dilated, although the diameter is  not as exaggerated as the most recent comparison plain film. IMPRESSION: Compared to prior plain films there has been progression of enteric contrast to the terminal ileum and throughout the length of the colon, with decreasing diameter of borderline dilated small bowel loops in the mid left abdomen. Findings suggest resolving partial bowel obstruction. Unchanged gastric tube. Signed, Dulcy Fanny. Earleen Newport, DO [facility] Electronically Signed   By: Corrie Mckusick D.O.   On: 06/23/2015 20:40   Dg Abd Portable 1v  06/23/2015  CLINICAL DATA:  Small bowel obstruction EXAM: PORTABLE ABDOMEN - 1 VIEW COMPARISON:  06/22/2015 FINDINGS: Loops of small bowel are similar to the prior exam and  measuring up to 5.8 cm in diameter, previously the same by my measurements. There is gas within nondilated transverse colon. Nasogastric tube remains coiled in the stomach. IMPRESSION: 1. Similar degree of dilatation of small bowel loops, measuring up to 5.8 cm in diameter. Given the lack of colonic dilatation, appearance favors obstruction over ileus. Electronically Signed   By: Van Clines M.D.   On: 06/23/2015 07:58   Dg Abd Portable 1v  06/22/2015  CLINICAL DATA:  NG tube placement.  Hemoptysis. EXAM: PORTABLE ABDOMEN - 1 VIEW COMPARISON:  Radiographs earlier this day at 0550 hour FINDINGS: Tip and side-port of the enteric tube below the diaphragm, tip directed towards the fundus, unchanged from prior exam. Diffusely dilated small bowel loops in the central abdomen, with equivocal improvement, greatest dimension 5.3 cm, previously 5.9 cm. No evidence of free air. IMPRESSION: Persistent small-bowel obstruction, with equivocal improvement and decrease in degree of bowel distention. Enteric tube remains in place. Electronically Signed   By: Jeb Levering M.D.   On: 06/22/2015 19:32   Dg Abd Portable 1v  06/22/2015  CLINICAL DATA:  75 year old male with abdominal pain and distention. Small bowel obstruction. EXAM: PORTABLE ABDOMEN - 1 VIEW COMPARISON:  Abdominal radiograph 06/21/2015. FINDINGS: Nasogastric tube coiled in the stomach with tip in the region of the cardia. Gaseous distention throughout the small bowel which is dilated up to 5.9 cm in diameter. Some gas is also noted throughout the colon. No gross evidence of pneumoperitoneum. Calcific density projecting over the left ilium, presumably something ingested. This is move slightly compared to yesterday's examination, and is new compared to prior study 06/20/2015. IMPRESSION: 1. Findings remain compatible with partial small bowel obstruction, as above. 2. No gross evidence of pneumoperitoneum on this single supine view. 3. Nasogastric tube is  coiled back upon itself with tip in the cardia of the stomach. Electronically Signed   By: Vinnie Langton M.D.   On: 06/22/2015 08:01   Dg Abd Portable 1v  06/21/2015  CLINICAL DATA:  Small bowel obstruction EXAM: PORTABLE ABDOMEN - 1 VIEW COMPARISON:  06/20/2015 FINDINGS: Nasogastric tube is in place with tip overlying the level of the stomach. Numerous dilated small bowel loops are again identified. There is paucity of large bowel gas, identified within nondilated loops. No evidence for free intraperitoneal air on this supine view of the abdomen. Note is made of prostatic seeds. IMPRESSION: Persistent, stable dilatation of small bowel loops consistent with partial small bowel obstruction. Electronically Signed   By: Nolon Nations M.D.   On: 06/21/2015 08:56   Dg Abd Portable 1v  06/20/2015  CLINICAL DATA:  Small bowel obstruction EXAM: PORTABLE ABDOMEN - 1 VIEW COMPARISON:  06/19/2015 FINDINGS: Nasogastric tube is coiled in the stomach. Stacked loops of gas-filled small bowel are present in the  left abdomen, similar to prior, measuring up to 5.2 cm diameter, previously 4.8 cm diameter. No dilated colon is observed. No secondary signs of free intraperitoneal gas. IMPRESSION: 1. Slight increase in small bowel dilation, individual loops measuring up to 5.2 cm in diameter. Appearance compatible with small bowel obstruction. Electronically Signed   By: Van Clines M.D.   On: 06/20/2015 08:16   Dg Abd Portable 1v  06/19/2015  CLINICAL DATA:  Nasogastric tube placement EXAM: PORTABLE ABDOMEN - 1 VIEW COMPARISON:  Study obtained earlier in the day FINDINGS: Nasogastric tube tip and side port are in the stomach. Stomach is no longer distended with air. There are multiple loops of dilated small bowel, concerning for a degree of obstruction. No free air is seen on this supine examination. IMPRESSION: Nasogastric tube tip and side port in stomach. Stomach no longer distended with air. There remain loops  of dilated small bowel, concerning for a degree of obstruction. Electronically Signed   By: Lowella Grip III M.D.   On: 06/19/2015 16:14   Dg Abd Portable 1v  06/19/2015  CLINICAL DATA:  Abdominal distension EXAM: PORTABLE ABDOMEN - 1 VIEW COMPARISON:  06/17/2015 FINDINGS: Interval significant distension of the stomach is seen. Increasing small bowel dilatation is noted. Persisting colonic air is noted consistent with a partial small bowel obstruction. Prostate brachytherapy seeds are noted. No free air is noted. IMPRESSION: Increasing distension of the small bowel and stomach as described. Decompression is recommended. Electronically Signed   By: Inez Catalina M.D.   On: 06/19/2015 10:54   Dg Foot Complete Left  06/17/2015  CLINICAL DATA:  Unable to walk.  Foot pain.  Diabetes. EXAM: LEFT FOOT - COMPLETE 3+ VIEW COMPARISON:  None. FINDINGS: Questionable soft tissue ulceration at the tips of the first and second toes. 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 No visible radiographic changes of acute osteomyelitis. No fracture, subluxation or dislocation. IMPRESSION: No radiographic changes of osteomyelitis. Electronically Signed   By: Rolm Baptise M.D.   On: 06/17/2015 18:11    Labs:  CBC:  Recent Labs  06/30/15 0536 07/01/15 0452 07/02/15 0630 07/02/15 2303 07/03/15 0635  WBC 8.2 7.6 5.9  --  7.7  HGB 7.8* 7.2* 6.6* 8.6* 8.9*  HCT 23.6* 21.9* 20.9* 26.0* 26.5*  PLT 293 307 294  --  281    COAGS:  Recent Labs  06/29/15 1116  INR 1.29  APTT 38*    BMP:  Recent Labs  06/30/15 0536 07/01/15 0455 07/02/15 0630 07/03/15 0635  NA 133* 129* 136  135 132*  K 3.3* 3.1* 3.4*  3.4* 3.2*  CL 100* 95* 100*  99* 98*  CO2 25 23 27  27 24   GLUCOSE 114* 107* 98  98 66  BUN 27* 37* 22*  21* 28*  CALCIUM 7.1* 7.5* 7.6*  7.6* 7.8*  CREATININE 6.15* 7.62* 5.76*  5.77* 7.69*  GFRNONAA 8* 6* 9*  9* 6*  GFRAA 9* 7* 10*  10* 7*    LIVER  FUNCTION TESTS:  Recent Labs  06/17/15 1708 06/18/15 0408  06/19/15 1430  06/25/15 0248  06/30/15 0536 07/01/15 0455 07/02/15 0630 07/03/15 0635  BILITOT 0.6 0.5  --  1.0  --  0.9  --   --   --   --   --   AST 73* 68*  --  69*  --  18  --   --   --   --   --  ALT 29 28  --  45  --  18  --   --   --   --   --   ALKPHOS 91 91  --  83  --  81  --   --   --   --   --   PROT 6.6 5.8*  --  5.7*  --  5.8*  --   --   --   --   --   ALBUMIN 2.9* 2.6*  < > 2.1*  < > 1.6*  1.7*  < > 1.7* 1.6* 1.6* 1.7*  < > = values in this interval not displayed.  TUMOR MARKERS: No results for input(s): AFPTM, CEA, CA199, CHROMGRNA in the last 8760 hours.  Assessment and Plan:  No recovery of renal function after HD since 12/31 Using temp R IJ cath placed by [facility] 12/30 Now request for tunneled catheter placement Plan for procedure of conversion of temp cath to tunneled vs new placement 07/06/15 in IR Risks and Benefits discussed with the patient including, but not limited to bleeding, infection, vascular injury, pneumothorax which may require chest tube placement, air embolism or even death All of the patient's questions were answered, patient is agreeable to proceed. Consent signed and in chart.   Thank you for this interesting consult.  I greatly enjoyed meeting David Ford and look forward to participating in their care.  A copy of this report was sent to the requesting provider on this date.  Electronically Signed: Monia Sabal A 07/03/2015, 1:05 PM   I spent a total of 20 Minutes    in face to face in clinical consultation, greater than 50% of which was counseling/coordinating care for tunneled HD cath placement

## 2015-07-04 LAB — GLUCOSE, CAPILLARY
GLUCOSE-CAPILLARY: 109 mg/dL — AB (ref 65–99)
Glucose-Capillary: 156 mg/dL — ABNORMAL HIGH (ref 65–99)
Glucose-Capillary: 193 mg/dL — ABNORMAL HIGH (ref 65–99)
Glucose-Capillary: 160 mg/dL — ABNORMAL HIGH (ref 65–99)

## 2015-07-04 LAB — RENAL FUNCTION PANEL
ALBUMIN: 1.8 g/dL — AB (ref 3.5–5.0)
Anion gap: 12 (ref 5–15)
BUN: 26 mg/dL — AB (ref 6–20)
CALCIUM: 7.8 mg/dL — AB (ref 8.9–10.3)
CO2: 21 mmol/L — AB (ref 22–32)
CREATININE: 6.88 mg/dL — AB (ref 0.61–1.24)
Chloride: 100 mmol/L — ABNORMAL LOW (ref 101–111)
GFR calc Af Amer: 8 mL/min — ABNORMAL LOW (ref >60)
GFR calc non Af Amer: 7 mL/min — ABNORMAL LOW (ref >60)
GLUCOSE: 127 mg/dL — AB (ref 65–99)
PHOSPHORUS: 5.4 mg/dL — AB (ref 2.5–4.6)
Potassium: 3.9 mmol/L (ref 3.5–5.1)
SODIUM: 133 mmol/L — AB (ref 135–145)

## 2015-07-04 LAB — CBC
HCT: 26.5 % — ABNORMAL LOW (ref 39.0–52.0)
HEMOGLOBIN: 8.9 g/dL — AB (ref 13.0–17.0)
MCH: 28.2 pg (ref 26.0–34.0)
MCHC: 33.6 g/dL (ref 30.0–36.0)
MCV: 83.9 fL (ref 78.0–100.0)
RBC: 3.16 MIL/uL — AB (ref 4.22–5.81)
RDW: 14.9 % (ref 11.5–15.5)
WBC: 7.6 10*3/uL (ref 4.0–10.5)

## 2015-07-04 NOTE — Anesthesia Postprocedure Evaluation (Signed)
Anesthesia Post Note  Patient: David Ford  Procedure(s) Performed: Procedure(s) (LRB): Left Foot 1st and 2nd Ray Amputation (Left)  Patient location during evaluation: PACU Anesthesia Type: General Level of consciousness: sedated Pain management: pain level controlled Vital Signs Assessment: post-procedure vital signs reviewed and stable Respiratory status: spontaneous breathing and respiratory function stable Cardiovascular status: stable Anesthetic complications: no                 Yomaris Palecek DANIEL

## 2015-07-04 NOTE — Progress Notes (Addendum)
[facility] Progress Note   David Ford  [id number]  DOB: 31-Jul-1940  DOA: 06/17/2015 PCP: No primary care provider on file.  Brief narrative: David Ford is a 75 y.o. male with DM, HTN admitted after a fall from his couch and being on the floor for about 3 days. His found by a friend who then called EMS. The patient was noted to have rhabdomyolysis, glucose of 454, acute renal failure with a creatinine of 4 and a potassium of 6.0. WBC count was also elevated at 16.2. He was noted to have gangrenous toes on his left foot.   Subjective: S/p surgery yesterday- no complaints of pain.   ROS: no nausea, vomiting, constipation, diarrhea, dyspnea or pain.   Assessment/Plan: Principal Problem:   Diabetic foot infection -Initially started on vancomycin and Zosyn-vancomycin discontinued -s/p amputation of left 1st and 2nd ray on 1/13  Active Problems: Rhabdomyolysis -Steadily resolved  Acute renal failure --Started on dialysis treatments when first admitted and Continues to require dialysis -Nephrology team managing  Upper GI bleed -Suspected be secondary to NG tube insertion and gastritis  AOCD - transfused 2 U PRBC for Hb of 6.6  Partial small bowel obstruction -Resolved with conservative management   Atrial fibrillation with RVR -Occurred on hemodialysis on 12/1 -2-D echo revealed EF of 50-55% and grade 1 diastolic dysfunction -TSH was normal at 0.653 on 12/31 - has not had any further episodes noted since then- will hold off on anticoagulation   CAD - mild elevation in Troponin- ECHO without WMA- cardiology following  DM2 - cont Lantus and Insulin  Essential HTN - coreg and Hydralazine   Antibiotics: Anti-infectives    Start     Dose/Rate Route Frequency Ordered Stop   07/04/15 0100  ceFAZolin (ANCEF) IVPB 1 g/50 mL premix     1 g 100 mL/hr over 30 Minutes Intravenous Every 6 hours 07/03/15 2051 07/04/15 1859   06/28/15 2200   piperacillin-tazobactam (ZOSYN) IVPB 2.25 g     2.25 g 100 mL/hr over 30 Minutes Intravenous 3 times per day 06/28/15 1737 07/02/15 0559   06/28/15 1945  piperacillin-tazobactam (ZOSYN) IVPB 2.25 g  Status:  Discontinued     2.25 g 100 mL/hr over 30 Minutes Intravenous Every 6 hours 06/28/15 1736 06/28/15 1737   06/23/15 0100  piperacillin-tazobactam (ZOSYN) IVPB 2.25 g  Status:  Discontinued     2.25 g 100 mL/hr over 30 Minutes Intravenous Every 8 hours 06/22/15 1912 06/28/15 1735   06/22/15 1600  vancomycin (VANCOCIN) IVPB 1000 mg/200 mL premix     1,000 mg 200 mL/hr over 60 Minutes Intravenous  Once 06/22/15 1448 06/22/15 1730   06/20/15 1400  vancomycin (VANCOCIN) IVPB 1000 mg/200 mL premix     1,000 mg 200 mL/hr over 60 Minutes Intravenous  Once 06/20/15 1205 06/20/15 1538   06/19/15 2100  vancomycin (VANCOCIN) IVPB 1000 mg/200 mL premix  Status:  Discontinued     1,000 mg 200 mL/hr over 60 Minutes Intravenous Every 48 hours 06/17/15 2214 06/18/15 0945   06/18/15 1200  vancomycin (VANCOCIN) IVPB 1000 mg/200 mL premix  Status:  Discontinued     1,000 mg 200 mL/hr over 60 Minutes Intravenous Every 48 hours 06/18/15 0945 06/20/15 1204   06/18/15 0530  piperacillin-tazobactam (ZOSYN) IVPB 2.25 g  Status:  Discontinued     2.25 g 100 mL/hr over 30 Minutes Intravenous Every 8 hours 06/17/15 2214 06/22/15 1912   06/17/15 2115  piperacillin-tazobactam (ZOSYN) IVPB 3.375 g  3.375 g 100 mL/hr over 30 Minutes Intravenous  Once 06/17/15 2111 06/17/15 2151   06/17/15 2115  vancomycin (VANCOCIN) IVPB 1000 mg/200 mL premix     1,000 mg 200 mL/hr over 60 Minutes Intravenous  Once 06/17/15 2111 06/18/15 0235     Code Status:     Code Status Orders        Start     Ordered   06/17/15 2215  Full code   Continuous     06/17/15 2214    Code Status History    Date Active Date Inactive Code Status Order ID Comments User Context   This patient has a current code status but no historical  code status.     Family Communication:  Disposition Plan: awaiting amputation DVT prophylaxis: Heparin Consultants: Nephrology, Ortho, Cardiology  Procedures:      Objective: Filed Weights   07/02/15 2100 07/03/15 0656 07/03/15 1100  Weight: 99.7 kg (219 lb 12.8 oz) 99.5 kg (219 lb 5.7 oz) 96.5 kg (212 lb 11.9 oz)    Intake/Output Summary (Last 24 hours) at 07/04/15 1128 Last data filed at 07/04/15 1051  Gross per 24 hour  Intake    563 ml  Output      0 ml  Net    563 ml     Vitals Filed Vitals:   07/03/15 2025 07/03/15 2100 07/04/15 0544 07/04/15 0853  BP:  138/60 128/59 133/67  Pulse: 79 78 87 98  Temp: 98.4 F (36.9 C) 98.3 F (36.8 C) 98.2 F (36.8 C) 98.6 F (37 C)  TempSrc:  Oral Oral Oral  Resp: 13 16 20 16   Height:      Weight:      SpO2: 100% 99% 98% 99%    Exam:  General:  Pt is alert, not in acute distress  HEENT: No icterus, No thrush, oral mucosa moist  Cardiovascular: regular rate and rhythm, S1/S2 No murmur  Respiratory: clear to auscultation bilaterally   Abdomen: Soft, +Bowel sounds, non tender, non distended, no guarding  MSK: No cyanosis or clubbing- no pedal edema- s/p amputation- dressing not opened   Data Reviewed: Basic Metabolic Panel:  Recent Labs Lab 06/30/15 0536 07/01/15 0455 07/02/15 0630 07/03/15 0635 07/04/15 0549  NA 133* 129* 136  135 132* 133*  K 3.3* 3.1* 3.4*  3.4* 3.2* 3.9  CL 100* 95* 100*  99* 98* 100*  CO2 25 23 27  27 24  21*  GLUCOSE 114* 107* 98  98 66 127*  BUN 27* 37* 22*  21* 28* 26*  CREATININE 6.15* 7.62* 5.76*  5.77* 7.69* 6.88*  CALCIUM 7.1* 7.5* 7.6*  7.6* 7.8* 7.8*  PHOS 5.4* 6.2* 5.1* 5.6* 5.4*   Liver Function Tests:  Recent Labs Lab 06/30/15 0536 07/01/15 0455 07/02/15 0630 07/03/15 0635 07/04/15 0549  ALBUMIN 1.7* 1.6* 1.6* 1.7* 1.8*   No results for input(s): LIPASE, AMYLASE in the last 168 hours. No results for input(s): AMMONIA in the last 168  hours. CBC:  Recent Labs Lab 06/30/15 0536 07/01/15 0452 07/02/15 0630 07/02/15 2303 07/03/15 0635 07/04/15 0549  WBC 8.2 7.6 5.9  --  7.7 7.6  HGB 7.8* 7.2* 6.6* 8.6* 8.9* 8.9*  HCT 23.6* 21.9* 20.9* 26.0* 26.5* 26.5*  MCV 85.2 85.5 85.7  --  84.4 83.9  PLT 293 307 294  --  281 PLATELET CLUMPS NOTED ON SMEAR   Cardiac Enzymes: No results for input(s): CKTOTAL, CKMB, CKMBINDEX, TROPONINI in the last 168 hours. BNP (last 3  results) No results for input(s): BNP in the last 8760 hours.  ProBNP (last 3 results) No results for input(s): PROBNP in the last 8760 hours.  CBG:  Recent Labs Lab 07/03/15 1202 07/03/15 1758 07/03/15 1953 07/03/15 2118 07/04/15 0854  GLUCAP 205* 104* 94 120* 156*    Recent Results (from the past 240 hour(s))  Surgical pcr screen     Status: None   Collection Time: 07/03/15  2:00 PM  Result Value Ref Range Status   MRSA, PCR NEGATIVE NEGATIVE Final   Staphylococcus aureus NEGATIVE NEGATIVE Final    Comment:        The Xpert SA Assay (FDA approved for NASAL specimens in patients over 22 years of age), is one component of a comprehensive surveillance program.  Test performance has been validated by [facility] for patients greater than or equal to 75 year old. It is not intended to diagnose infection nor to guide or monitor treatment.      Studies: No results found.  Scheduled Meds:  Scheduled Meds: . antiseptic oral rinse  7 mL Mouth Rinse q12n4p  . bisacodyl  10 mg Rectal Once  . brimonidine  1 drop Both Eyes BID  . carvedilol  6.25 mg Oral BID WC  .  ceFAZolin (ANCEF) IV  1 g Intravenous Q6H  . chlorhexidine  15 mL Mouth Rinse BID  . darbepoetin (ARANESP) injection - DIALYSIS  100 mcg Intravenous Q Fri-HD  . feeding supplement (NEPRO CARB STEADY)  237 mL Oral BID BM  . fluticasone  1 spray Each Nare Daily  . gabapentin  300 mg Oral BID  . hydrALAZINE  100 mg Oral BID  . insulin aspart  0-15 Units Subcutaneous TID WC  .  insulin aspart  0-5 Units Subcutaneous QHS  . insulin aspart  6 Units Subcutaneous TID WC  . insulin glargine  15 Units Subcutaneous Daily  . latanoprost  1 drop Both Eyes QHS  . pantoprazole  40 mg Oral BID  . polyethylene glycol  17 g Oral Daily  . rosuvastatin  20 mg Oral Daily  . senna-docusate  1 tablet Oral BID  . sevelamer carbonate  1,600 mg Oral TID WC  . sodium chloride  3 mL Intravenous Q12H   Continuous Infusions: . sodium chloride 10 mL/hr at 07/03/15 1819  . sodium chloride 10 mL/hr at 07/03/15 2207    Time spent on care of this patient: 6 min   Hettinger, MD 07/04/2015, 11:28 AM  LOS: 17 days   [facility] Office  [phone] Pager - Text Page per [email or web address] If 7PM-7AM, please contact night-coverage [email or web address]

## 2015-07-04 NOTE — Progress Notes (Signed)
[facility] Progress Note    Assessment/ Plan:   Note: Rhabdo after being on floor for 3 days with a baseline cr of 2 in 04/2015. Gangrenous foot, ACE, GIB and developed AKI requiring HD started on 12/31. Partial bowel obstruction as well. Lt foot 1st and 2nd digit ray amputation on 1/13. No signs of recovery.  1. Anuric AKI on CKD3 (SCr 04/2015 was 2.0) (Probable diabetic nephropathy with baseline CKD3). Rhabdo/gangrenous foot w/sepsis/vol depletion/ACE PTA-->all led to ischemic ATN. s/p R IJ Temp HD cath 06/19/15 and started HD 12/31. Still negligible UOP. Foley out. Prn bladder scans. Remains HD dependent. - No evidence yet for any recovery --> tolerated HD on 1/13 (MWF until he recovers). - Requested conversion to tunneled catheter by VIR after amputation to prevent seeding of the permcath. Patient remains afebrile with improving WBC   --> appreciate VIR seeing the patient so promptly; they will place one on Monday. Will schedule HD around the conversion.  2. Dialysis access: R IJ temp cath (12/30) 3. Diabetic foot ulcer/gangrene of left 1st 2 toes - deferred until more stable per Dr. Sharol Given - he has been reconsulted? 4. Sepsis d/t #4. Vanco and zosyn 5. Partial SBO with partial small bowel malrotation. Resolved. Bowels moving. Tolerating diet. Surgery has signed off. 6. UGIB - Felt gastritis/asa/tube trauma/uremia. Hb variable last 4 days. On protonix. 7. Anemia - Fe def. Rec'd Feraheme 12/31. Added aranesp 100/week. Dosed 1/6. If no improvement after amputation (hopefully decreases inflammatory state) then will increase Aranesp at that time. Resistance bec of the gangrenous toe. 8. DM2 - ssi 9. HTN - meds 10. AFib with RVR - occurred with HD 12/31. BB for rate control  11. + trops. ? Demand ischemia. EF 50-55%  Subjective:   No complaints Eating, bowels moving, no abd pain Just finished HD today Still no urine output and appears to be euvolemic.    Objective:   BP 128/59 mmHg  Pulse 87  Temp(Src) 98.2 F (36.8 C) (Oral)  Resp 20  Ht 6\' 2"  (1.88 m)  Wt 96.5 kg (212 lb 11.9 oz)  BMI 27.30 kg/m2  SpO2 98%  Intake/Output Summary (Last 24 hours) at 07/04/15 [id number] Last data filed at 07/03/15 2015  Gross per 24 hour  Intake    340 ml  Output   2502 ml  Net  -2162 ml   Weight change: -3.2 kg (-7 lb 0.9 oz)  Physical Exam: GEN: NAD, elderly male lying in bed.  Very pleasant CV: Irreg S1S2 with wide split S2 PULM: Ant clear. ABD: Mod abd distension. + BS. No focal tenderness  EXT:No edema.  Foot not examined (wrapped). R IJ Temp HD cath (12/30)   Imaging: No results found.  Labs: BMET  Recent Labs Lab 06/28/15 0509 06/29/15 0729 06/30/15 0536 07/01/15 0455 07/02/15 0630 07/03/15 0635 07/04/15 0549  NA 130* 130* 133* 129* 136  135 132* 133*  K 3.3* 3.3* 3.3* 3.1* 3.4*  3.4* 3.2* 3.9  CL 97* 96* 100* 95* 100*  99* 98* 100*  CO2 22 20* 25 23 27  27 24  21*  GLUCOSE 120* 90 114* 107* 98  98 66 127*  BUN 53* 65* 27* 37* 22*  21* 28* 26*  CREATININE 7.65* 9.94* 6.15* 7.62* 5.76*  5.77* 7.69* 6.88*  CALCIUM 7.7* 7.7* 7.1* 7.5* 7.6*  7.6* 7.8* 7.8*  PHOS 5.8* 8.3* 5.4* 6.2* 5.1* 5.6* 5.4*   CBC  Recent Labs Lab 07/01/15 0452 07/02/15 0630 07/02/15 2303 07/03/15  [id number] 07/04/15 0549  WBC 7.6 5.9  --  7.7 PENDING  HGB 7.2* 6.6* 8.6* 8.9* 8.9*  HCT 21.9* 20.9* 26.0* 26.5* 26.5*  MCV 85.5 85.7  --  84.4 83.9  PLT 307 294  --  281 PENDING    Medications:    . antiseptic oral rinse  7 mL Mouth Rinse q12n4p  . bisacodyl  10 mg Rectal Once  . brimonidine  1 drop Both Eyes BID  . carvedilol  6.25 mg Oral BID WC  .  ceFAZolin (ANCEF) IV  1 g Intravenous Q6H  . chlorhexidine  15 mL Mouth Rinse BID  . darbepoetin (ARANESP) injection - DIALYSIS  100 mcg Intravenous Q Fri-HD  . feeding supplement (NEPRO CARB STEADY)  237 mL Oral BID BM  . fluticasone  1 spray Each Nare Daily  . gabapentin  300 mg  Oral BID  . hydrALAZINE  100 mg Oral BID  . insulin aspart  0-15 Units Subcutaneous TID WC  . insulin aspart  0-5 Units Subcutaneous QHS  . insulin aspart  6 Units Subcutaneous TID WC  . insulin glargine  15 Units Subcutaneous Daily  . latanoprost  1 drop Both Eyes QHS  . pantoprazole  40 mg Oral BID  . polyethylene glycol  17 g Oral Daily  . rosuvastatin  20 mg Oral Daily  . senna-docusate  1 tablet Oral BID  . sevelamer carbonate  1,600 mg Oral TID WC  . sodium chloride  3 mL Intravenous Q12H      Otelia Santee, MD 07/04/2015, 8:26 AM

## 2015-07-04 NOTE — Progress Notes (Signed)
Physical Therapy Treatment Patient Details Name: David Ford MRN: [id number] DOB: 1940-12-19 Today's Date: 07/04/2015    History of Present Illness 75 yo M with hx of DM, HTN Here with generalized weakness was found to have ARF most likely due to dehydration in the setting of ACEi and was found to have untreated diabetic foot ulcer.  Had ray amputation of  L foot on 07/03/15. rhabdomyolysis . ARF. SBO.     PT Comments    Pt is getting up to bedside with poor tolerance for mobility after having L foot surgery with ray removed, NWB now.  He is struggling to have the energy to keep L foot off the floor and due to his poor control did not attempt more.  Pt is not feeling like standing and asked to get back to bed.  Total lift due to his limited energy.  Follow Up Recommendations  CIR     Equipment Recommendations  Rolling walker with 5" wheels    Recommendations for Other Services Rehab consult     Precautions / Restrictions Precautions Precautions: Fall Precaution Comments: has become extremely weak from the surgery Restrictions Weight Bearing Restrictions: Yes Other Position/Activity Restrictions: NWB on LLE    Mobility  Bed Mobility Overal bed mobility: Needs Assistance Bed Mobility: Supine to Sit;Sit to Supine     Supine to sit: Mod assist Sit to supine: Total assist      Transfers Overall transfer level:  (declined to attempt due to pt not being able to maintain NWB)               General transfer comment: too lethargic to safely attempt  Ambulation/Gait                 Stairs            Wheelchair Mobility    Modified Rankin (Stroke Patients Only)       Balance Overall balance assessment: Needs assistance   Sitting balance-Leahy Scale: Fair                              Cognition Arousal/Alertness: Lethargic Behavior During Therapy: Flat affect Overall Cognitive Status: Impaired/Different from baseline Area of  Impairment: Following commands;Safety/judgement;Awareness;Problem solving     Memory: Decreased short-term memory Following Commands: Follows one step commands inconsistently Safety/Judgement: Decreased awareness of safety;Decreased awareness of deficits Awareness: Intellectual Problem Solving: Slow processing;Decreased initiation;Difficulty sequencing;Requires verbal cues;Requires tactile cues      Exercises      General Comments General comments (skin integrity, edema, etc.): Pt was able to sit up with PT bedside but did not help to get back to bed.  Pt just got very quiet and did not move much.      Pertinent Vitals/Pain Pain Assessment: No/denies pain    Home Living                      Prior Function            PT Goals (current goals can now be found in the care plan section) Acute Rehab PT Goals Patient Stated Goal: get better Progress towards PT goals: Progressing toward goals    Frequency  Min 3X/week    PT Plan Current plan remains appropriate    Co-evaluation             End of Session   Activity Tolerance: Patient limited by fatigue Patient left: in bed;with  call bell/phone within reach     Time: 1425-1448 PT Time Calculation (min) (ACUTE ONLY): 23 min  Charges:  $Therapeutic Activity: 8-22 mins                    G Codes:      Ramond Dial 07/07/2015, 4:36 PM   Mee Hives, PT MS Acute Rehab Dept. Number: [phone] [phone] and [facility] [phone]

## 2015-07-05 LAB — CBC
HEMATOCRIT: 25 % — AB (ref 39.0–52.0)
HEMOGLOBIN: 8.4 g/dL — AB (ref 13.0–17.0)
MCH: 28.3 pg (ref 26.0–34.0)
MCHC: 33.6 g/dL (ref 30.0–36.0)
MCV: 84.2 fL (ref 78.0–100.0)
Platelets: 206 10*3/uL (ref 150–400)
RBC: 2.97 MIL/uL — ABNORMAL LOW (ref 4.22–5.81)
RDW: 14.8 % (ref 11.5–15.5)
WBC: 7.3 10*3/uL (ref 4.0–10.5)

## 2015-07-05 LAB — C DIFFICILE QUICK SCREEN W PCR REFLEX
C DIFFICILE (CDIFF) TOXIN: NEGATIVE
C Diff antigen: NEGATIVE
C Diff interpretation: NEGATIVE

## 2015-07-05 LAB — RENAL FUNCTION PANEL
ALBUMIN: 1.7 g/dL — AB (ref 3.5–5.0)
ANION GAP: 14 (ref 5–15)
BUN: 36 mg/dL — AB (ref 6–20)
CO2: 22 mmol/L (ref 22–32)
Calcium: 8.1 mg/dL — ABNORMAL LOW (ref 8.9–10.3)
Chloride: 95 mmol/L — ABNORMAL LOW (ref 101–111)
Creatinine, Ser: 8.76 mg/dL — ABNORMAL HIGH (ref 0.61–1.24)
GFR calc Af Amer: 6 mL/min — ABNORMAL LOW (ref >60)
GFR calc non Af Amer: 5 mL/min — ABNORMAL LOW (ref >60)
GLUCOSE: 179 mg/dL — AB (ref 65–99)
POTASSIUM: 3.6 mmol/L (ref 3.5–5.1)
Phosphorus: 6.9 mg/dL — ABNORMAL HIGH (ref 2.5–4.6)
Sodium: 131 mmol/L — ABNORMAL LOW (ref 135–145)

## 2015-07-05 LAB — GLUCOSE, CAPILLARY
GLUCOSE-CAPILLARY: 132 mg/dL — AB (ref 65–99)
Glucose-Capillary: 163 mg/dL — ABNORMAL HIGH (ref 65–99)
Glucose-Capillary: 206 mg/dL — ABNORMAL HIGH (ref 65–99)
Glucose-Capillary: 216 mg/dL — ABNORMAL HIGH (ref 65–99)

## 2015-07-05 MED ORDER — DIPHENOXYLATE-ATROPINE 2.5-0.025 MG PO TABS
2.0000 | ORAL_TABLET | Freq: Once | ORAL | Status: AC
  Administered 2015-07-05: 2 via ORAL
  Filled 2015-07-05: qty 2

## 2015-07-05 NOTE — Progress Notes (Signed)
[facility] Progress Note    Assessment/ Plan:   Note: Rhabdo after being on floor for 3 days with a baseline cr of 2 in 04/2015. Gangrenous foot, ACE, GIB and developed AKI requiring HD started on 12/31. Partial bowel obstruction as well. Lt foot 1st and 2nd digit ray amputation on 1/13. No signs of recovery.  1. Anuric AKI on CKD3 (SCr 04/2015 was 2.0) (Probable diabetic nephropathy with baseline CKD3). Rhabdo/gangrenous foot w/sepsis/vol depletion/ACE PTA-->all led to ischemic ATN. s/p R IJ Temp HD cath 06/19/15 and started HD 12/31. Still negligible UOP. Foley out. Prn bladder scans. Remains HD dependent. - No evidence yet for any recovery --> tolerated HD on 1/13 (MWF until he recovers). - Requested conversion to tunneled catheter by VIR after amputation to prevent seeding of the permcath. Patient remains afebrile with improving WBC  --> appreciate VIR seeing the patient so promptly; they will place one on Monday. Will schedule HD around the conversion. - Bladder scan on 1/15 --> 333mL --> requested foley (we'll see what a STAT chemistry panel in AM shows). If UOP picks up tremendously overnight would hold off on the tunneled catheter.  2. Dialysis access: R IJ temp cath (12/30) 3. Diabetic foot ulcer/gangrene of left 1st 2 toes - deferred until more stable per Dr. Sharol Given - he has been reconsulted? 4. Sepsis d/t #4. Vanco and zosyn 5. Partial SBO with partial small bowel malrotation. Resolved. Bowels moving. Tolerating diet. Surgery has signed off. 6. UGIB - Felt gastritis/asa/tube trauma/uremia. Hb variable last 4 days. On protonix. 7. Anemia - Fe def. Rec'd Feraheme 12/31. Added aranesp 100/week. Dosed 1/6. If no improvement after amputation (hopefully decreases inflammatory state) then will increase Aranesp at that time. Resistance bec of the gangrenous toe. 8. DM2 - ssi 9. HTN - meds 10. AFib with RVR - occurred with HD 12/31. BB for rate control   11. + trops. ? Demand ischemia. EF 50-55%  Subjective:   No complaints Eating, bowels moving, no abd pain Last HD 1/13 (Friday) Appears to be euvolemic. Bladder scan on 07/05/15 336mL   Objective:   BP 130/57 mmHg  Pulse 85  Temp(Src) 98.1 F (36.7 C) (Oral)  Resp 17  Ht 6\' 2"  (1.88 m)  Wt 96.5 kg (212 lb 11.9 oz)  BMI 27.30 kg/m2  SpO2 98%  Intake/Output Summary (Last 24 hours) at 07/05/15 [id number] Last data filed at 07/05/15 0600  Gross per 24 hour  Intake    460 ml  Output      0 ml  Net    460 ml   Weight change: 0 kg (0 lb)  Physical Exam: GEN: NAD, elderly male lying in bed.  Very pleasant CV: Irreg S1S2 with wide split S2 PULM: Ant clear. ABD: Mod abd distension. + BS. No focal tenderness  EXT:No edema.  GU: no foley Foot not examined (wrapped). R IJ Temp HD cath (12/30)   Imaging: No results found.  Labs: BMET  Recent Labs Lab 06/29/15 0729 06/30/15 0536 07/01/15 0455 07/02/15 0630 07/03/15 0635 07/04/15 0549 07/05/15 0433  NA 130* 133* 129* 136  135 132* 133* 131*  K 3.3* 3.3* 3.1* 3.4*  3.4* 3.2* 3.9 3.6  CL 96* 100* 95* 100*  99* 98* 100* 95*  CO2 20* 25 23 27  27 24  21* 22  GLUCOSE 90 114* 107* 98  98 66 127* 179*  BUN 65* 27* 37* 22*  21* 28* 26* 36*  CREATININE 9.94* 6.15* 7.62* 5.76*  5.77* 7.69* 6.88* 8.76*  CALCIUM 7.7* 7.1* 7.5* 7.6*  7.6* 7.8* 7.8* 8.1*  PHOS 8.3* 5.4* 6.2* 5.1* 5.6* 5.4* 6.9*   CBC  Recent Labs Lab 07/02/15 0630 07/02/15 2303 07/03/15 0635 07/04/15 0549 07/05/15 0433  WBC 5.9  --  7.7 7.6 7.3  HGB 6.6* 8.6* 8.9* 8.9* 8.4*  HCT 20.9* 26.0* 26.5* 26.5* 25.0*  MCV 85.7  --  84.4 83.9 84.2  PLT 294  --  281 PLATELET CLUMPS NOTED ON SMEAR 206    Medications:    . antiseptic oral rinse  7 mL Mouth Rinse q12n4p  . bisacodyl  10 mg Rectal Once  . brimonidine  1 drop Both Eyes BID  . carvedilol  6.25 mg Oral BID WC  . chlorhexidine  15 mL Mouth Rinse BID  . darbepoetin (ARANESP) injection -  DIALYSIS  100 mcg Intravenous Q Fri-HD  . feeding supplement (NEPRO CARB STEADY)  237 mL Oral BID BM  . fluticasone  1 spray Each Nare Daily  . gabapentin  300 mg Oral BID  . hydrALAZINE  100 mg Oral BID  . insulin aspart  0-15 Units Subcutaneous TID WC  . insulin aspart  0-5 Units Subcutaneous QHS  . insulin aspart  6 Units Subcutaneous TID WC  . insulin glargine  15 Units Subcutaneous Daily  . latanoprost  1 drop Both Eyes QHS  . pantoprazole  40 mg Oral BID  . polyethylene glycol  17 g Oral Daily  . rosuvastatin  20 mg Oral Daily  . senna-docusate  1 tablet Oral BID  . sevelamer carbonate  1,600 mg Oral TID WC  . sodium chloride  3 mL Intravenous Q12H      Otelia Santee, MD 07/05/2015, 9:27 AM

## 2015-07-05 NOTE — Progress Notes (Signed)
Patient bladder scanned: 382 mLs. On call NP notified. Awaiting orders. RN will continue to monitor patient.   Ermalinda Memos, RN

## 2015-07-05 NOTE — Progress Notes (Signed)
Patient has passed three loose stools since admission. Third stool was passed during this shift. On call NP notified by this RN for C.diff standing order. Please review orders. RN will continue to monitor patient.   Ermalinda Memos, RN

## 2015-07-05 NOTE — Progress Notes (Signed)
[facility] Progress Note   David Ford  [id number]  DOB: 1940-11-21  DOA: 06/17/2015 PCP: No primary care provider on file.  Brief narrative: David Ford is a 75 y.o. male with DM, HTN admitted after a fall from his couch and being on the floor for about 3 days. His found by a friend who then called EMS. The patient was noted to have rhabdomyolysis, glucose of 454, acute renal failure with a creatinine of 4 and a potassium of 6.0. WBC count was also elevated at 16.2. He was noted to have gangrenous toes on his left foot.   Subjective:  no complaints of pain. Having some diarrhea today due to laxatives.    Assessment/Plan: Principal Problem:   Diabetic foot infection -Initially started on vancomycin and Zosyn-vancomycin discontinued -s/p amputation of left 1st and 2nd ray on 1/13- zosyn discontinued   Active Problems: Rhabdomyolysis -Steadily resolved  Acute renal failure --Started on dialysis treatments when first admitted and continues to require dialysis -Nephrology team managing  Upper GI bleed -Suspected be secondary to NG tube insertion and gastritis  AOCD - transfused 2 U PRBC for Hb of 6.6  Partial small bowel obstruction -Resolved with conservative management   Atrial fibrillation with RVR -Occurred on hemodialysis on 12/1 -2-D echo revealed EF of 50-55% and grade 1 diastolic dysfunction -TSH was normal at 0.653 on 12/31 - has not had any further episodes noted since then- will hold off on anticoagulation   CAD - mild elevation in Troponin- ECHO without WMA- cardiology following  DM2 - cont Lantus and Insulin  Essential HTN - coreg and Hydralazine   Antibiotics: Anti-infectives    Start     Dose/Rate Route Frequency Ordered Stop   07/04/15 0100  ceFAZolin (ANCEF) IVPB 1 g/50 mL premix     1 g 100 mL/hr over 30 Minutes Intravenous Every 6 hours 07/03/15 2051 07/04/15 1758   06/28/15 2200  piperacillin-tazobactam (ZOSYN) IVPB 2.25 g      2.25 g 100 mL/hr over 30 Minutes Intravenous 3 times per day 06/28/15 1737 07/02/15 0559   06/28/15 1945  piperacillin-tazobactam (ZOSYN) IVPB 2.25 g  Status:  Discontinued     2.25 g 100 mL/hr over 30 Minutes Intravenous Every 6 hours 06/28/15 1736 06/28/15 1737   06/23/15 0100  piperacillin-tazobactam (ZOSYN) IVPB 2.25 g  Status:  Discontinued     2.25 g 100 mL/hr over 30 Minutes Intravenous Every 8 hours 06/22/15 1912 06/28/15 1735   06/22/15 1600  vancomycin (VANCOCIN) IVPB 1000 mg/200 mL premix     1,000 mg 200 mL/hr over 60 Minutes Intravenous  Once 06/22/15 1448 06/22/15 1730   06/20/15 1400  vancomycin (VANCOCIN) IVPB 1000 mg/200 mL premix     1,000 mg 200 mL/hr over 60 Minutes Intravenous  Once 06/20/15 1205 06/20/15 1538   06/19/15 2100  vancomycin (VANCOCIN) IVPB 1000 mg/200 mL premix  Status:  Discontinued     1,000 mg 200 mL/hr over 60 Minutes Intravenous Every 48 hours 06/17/15 2214 06/18/15 0945   06/18/15 1200  vancomycin (VANCOCIN) IVPB 1000 mg/200 mL premix  Status:  Discontinued     1,000 mg 200 mL/hr over 60 Minutes Intravenous Every 48 hours 06/18/15 0945 06/20/15 1204   06/18/15 0530  piperacillin-tazobactam (ZOSYN) IVPB 2.25 g  Status:  Discontinued     2.25 g 100 mL/hr over 30 Minutes Intravenous Every 8 hours 06/17/15 2214 06/22/15 1912   06/17/15 2115  piperacillin-tazobactam (ZOSYN) IVPB 3.375 g     3.375  g 100 mL/hr over 30 Minutes Intravenous  Once 06/17/15 2111 06/17/15 2151   06/17/15 2115  vancomycin (VANCOCIN) IVPB 1000 mg/200 mL premix     1,000 mg 200 mL/hr over 60 Minutes Intravenous  Once 06/17/15 2111 06/18/15 0235     Code Status:     Code Status Orders        Start     Ordered   06/17/15 2215  Full code   Continuous     06/17/15 2214    Code Status History    Date Active Date Inactive Code Status Order ID Comments User Context   This patient has a current code status but no historical code status.     Family Communication:   Disposition Plan: awaiting amputation DVT prophylaxis: Heparin Consultants: Nephrology, Ortho, Cardiology  Procedures:      Objective: Filed Weights   07/03/15 0656 07/03/15 1100 07/04/15 2257  Weight: 99.5 kg (219 lb 5.7 oz) 96.5 kg (212 lb 11.9 oz) 96.5 kg (212 lb 11.9 oz)    Intake/Output Summary (Last 24 hours) at 07/05/15 1120 Last data filed at 07/05/15 1103  Gross per 24 hour  Intake    600 ml  Output    301 ml  Net    299 ml     Vitals Filed Vitals:   07/04/15 2257 07/05/15 0430 07/05/15 0858 07/05/15 0901  BP: 107/58 103/63 130/57   Pulse: 83 95 85   Temp: 99.8 F (37.7 C) 98.6 F (37 C) 98.1 F (36.7 C) 98.1 F (36.7 C)  TempSrc: Oral  Oral Oral  Resp: 18 20 17    Height:      Weight: 96.5 kg (212 lb 11.9 oz)     SpO2: 99% 98% 98%     Exam:  General:  Pt is alert, not in acute distress  HEENT: No icterus, No thrush, oral mucosa moist  Cardiovascular: regular rate and rhythm, S1/S2 No murmur  Respiratory: clear to auscultation bilaterally   Abdomen: Soft, +Bowel sounds, non tender, non distended, no guarding  MSK: No cyanosis or clubbing- no pedal edema- 1st and 2nd ray amputation   Data Reviewed: Basic Metabolic Panel:  Recent Labs Lab 07/01/15 0455 07/02/15 0630 07/03/15 0635 07/04/15 0549 07/05/15 0433  NA 129* 136  135 132* 133* 131*  K 3.1* 3.4*  3.4* 3.2* 3.9 3.6  CL 95* 100*  99* 98* 100* 95*  CO2 23 27  27 24  21* 22  GLUCOSE 107* 98  98 66 127* 179*  BUN 37* 22*  21* 28* 26* 36*  CREATININE 7.62* 5.76*  5.77* 7.69* 6.88* 8.76*  CALCIUM 7.5* 7.6*  7.6* 7.8* 7.8* 8.1*  PHOS 6.2* 5.1* 5.6* 5.4* 6.9*   Liver Function Tests:  Recent Labs Lab 07/01/15 0455 07/02/15 0630 07/03/15 0635 07/04/15 0549 07/05/15 0433  ALBUMIN 1.6* 1.6* 1.7* 1.8* 1.7*   No results for input(s): LIPASE, AMYLASE in the last 168 hours. No results for input(s): AMMONIA in the last 168 hours. CBC:  Recent Labs Lab 07/01/15 0452  07/02/15 0630 07/02/15 2303 07/03/15 0635 07/04/15 0549 07/05/15 0433  WBC 7.6 5.9  --  7.7 7.6 7.3  HGB 7.2* 6.6* 8.6* 8.9* 8.9* 8.4*  HCT 21.9* 20.9* 26.0* 26.5* 26.5* 25.0*  MCV 85.5 85.7  --  84.4 83.9 84.2  PLT 307 294  --  281 PLATELET CLUMPS NOTED ON SMEAR 206   Cardiac Enzymes: No results for input(s): CKTOTAL, CKMB, CKMBINDEX, TROPONINI in the last 168 hours. BNP (  last 3 results) No results for input(s): BNP in the last 8760 hours.  ProBNP (last 3 results) No results for input(s): PROBNP in the last 8760 hours.  CBG:  Recent Labs Lab 07/04/15 0854 07/04/15 1159 07/04/15 1644 07/04/15 2237 07/05/15 0733  GLUCAP 156* 193* 109* 160* 163*    Recent Results (from the past 240 hour(s))  Surgical pcr screen     Status: None   Collection Time: 07/03/15  2:00 PM  Result Value Ref Range Status   MRSA, PCR NEGATIVE NEGATIVE Final   Staphylococcus aureus NEGATIVE NEGATIVE Final    Comment:        The Xpert SA Assay (FDA approved for NASAL specimens in patients over 85 years of age), is one component of a comprehensive surveillance program.  Test performance has been validated by [facility] for patients greater than or equal to 52 year old. It is not intended to diagnose infection nor to guide or monitor treatment.   C difficile quick scan w PCR reflex     Status: None   Collection Time: 07/05/15  9:47 AM  Result Value Ref Range Status   C Diff antigen NEGATIVE NEGATIVE Final   C Diff toxin NEGATIVE NEGATIVE Final   C Diff interpretation Negative for toxigenic C. difficile  Final     Studies: No results found.  Scheduled Meds:  Scheduled Meds: . antiseptic oral rinse  7 mL Mouth Rinse q12n4p  . bisacodyl  10 mg Rectal Once  . brimonidine  1 drop Both Eyes BID  . carvedilol  6.25 mg Oral BID WC  . chlorhexidine  15 mL Mouth Rinse BID  . darbepoetin (ARANESP) injection - DIALYSIS  100 mcg Intravenous Q Fri-HD  . diphenoxylate-atropine  2 tablet Oral  Once  . feeding supplement (NEPRO CARB STEADY)  237 mL Oral BID BM  . fluticasone  1 spray Each Nare Daily  . gabapentin  300 mg Oral BID  . hydrALAZINE  100 mg Oral BID  . insulin aspart  0-15 Units Subcutaneous TID WC  . insulin aspart  0-5 Units Subcutaneous QHS  . insulin aspart  6 Units Subcutaneous TID WC  . insulin glargine  15 Units Subcutaneous Daily  . latanoprost  1 drop Both Eyes QHS  . pantoprazole  40 mg Oral BID  . polyethylene glycol  17 g Oral Daily  . rosuvastatin  20 mg Oral Daily  . senna-docusate  1 tablet Oral BID  . sevelamer carbonate  1,600 mg Oral TID WC  . sodium chloride  3 mL Intravenous Q12H   Continuous Infusions: . sodium chloride 10 mL/hr at 07/03/15 1819  . sodium chloride 10 mL/hr at 07/03/15 2207    Time spent on care of this patient: 1 min   [place], MD 07/05/2015, 11:20 AM  LOS: 18 days   [facility] Office  [phone] Pager - Text Page per [email or web address] If 7PM-7AM, please contact night-coverage [email or web address]

## 2015-07-05 NOTE — Care Management Note (Signed)
Case Management Note  Patient Details  Name: David Ford MRN: [id number] Date of Birth: 01-Sep-1940  Subjective/Objective:        Gangrene of left foot, s/p  Left Foot 1st and 2nd [place] 1st and 2nd Ray Amputation 07/03/15           Action/Plan:  CM met with patient at bedside to discuss discharge planning. Patient reports he lives alone, his daughter [name] [phone]) lives here in [place].  Discussed recommendation for disposition plan to CIR, patient is amendable.   Rehab evaluation pending.  CM will continue to follow.  Expected Discharge Date:                  Expected Discharge Plan:  [place]  In-House Referral:  Clinical Social Work  Discharge planning Services  CM Consult  Post Acute Care Choice:  IP Rehab Choice offered to:  NA  DME Arranged:    DME Agency:     HH Arranged:  NA HH Agency:  NA  Status of Service:  In process, will continue to follow  Medicare Important Message Given:  Yes Date Medicare IM Given:    Medicare IM give by:    Date Additional Medicare IM Given:    Additional Medicare Important Message give by:     If discussed at [place] of Stay Meetings, dates discussed:    Additional CommentsLaurena Slimmer, RN 07/05/2015, 6:19 PM

## 2015-07-06 ENCOUNTER — Encounter (HOSPITAL_COMMUNITY): Payer: Self-pay | Admitting: Orthopedic Surgery

## 2015-07-06 ENCOUNTER — Inpatient Hospital Stay (HOSPITAL_COMMUNITY): Payer: Medicare HMO

## 2015-07-06 LAB — RENAL FUNCTION PANEL
ALBUMIN: 1.5 g/dL — AB (ref 3.5–5.0)
ANION GAP: 11 (ref 5–15)
ANION GAP: 11 (ref 5–15)
Albumin: 1.6 g/dL — ABNORMAL LOW (ref 3.5–5.0)
BUN: 47 mg/dL — ABNORMAL HIGH (ref 6–20)
BUN: 48 mg/dL — ABNORMAL HIGH (ref 6–20)
CALCIUM: 7.5 mg/dL — AB (ref 8.9–10.3)
CALCIUM: 7.4 mg/dL — AB (ref 8.9–10.3)
CO2: 23 mmol/L (ref 22–32)
CO2: 23 mmol/L (ref 22–32)
Chloride: 96 mmol/L — ABNORMAL LOW (ref 101–111)
Chloride: 96 mmol/L — ABNORMAL LOW (ref 101–111)
Creatinine, Ser: 10.18 mg/dL — ABNORMAL HIGH (ref 0.61–1.24)
Creatinine, Ser: 10.97 mg/dL — ABNORMAL HIGH (ref 0.61–1.24)
GFR calc Af Amer: 5 mL/min — ABNORMAL LOW (ref >60)
GFR calc non Af Amer: 4 mL/min — ABNORMAL LOW (ref >60)
GFR, EST AFRICAN AMERICAN: 5 mL/min — AB (ref >60)
GFR, EST NON AFRICAN AMERICAN: 4 mL/min — AB (ref >60)
GLUCOSE: 148 mg/dL — AB (ref 65–99)
GLUCOSE: 164 mg/dL — AB (ref 65–99)
PHOSPHORUS: 5.8 mg/dL — AB (ref 2.5–4.6)
POTASSIUM: 3.6 mmol/L (ref 3.5–5.1)
POTASSIUM: 3.5 mmol/L (ref 3.5–5.1)
Phosphorus: 6 mg/dL — ABNORMAL HIGH (ref 2.5–4.6)
SODIUM: 130 mmol/L — AB (ref 135–145)
SODIUM: 130 mmol/L — AB (ref 135–145)

## 2015-07-06 LAB — PROTIME-INR
INR: 1.3 (ref 0.00–1.49)
PROTHROMBIN TIME: 16.4 s — AB (ref 11.6–15.2)

## 2015-07-06 LAB — CBC
HCT: 23.1 % — ABNORMAL LOW (ref 39.0–52.0)
HEMATOCRIT: 19.9 % — AB (ref 39.0–52.0)
HEMOGLOBIN: 6.8 g/dL — AB (ref 13.0–17.0)
Hemoglobin: 7.3 g/dL — ABNORMAL LOW (ref 13.0–17.0)
MCH: 28.5 pg (ref 26.0–34.0)
MCH: 26.6 pg (ref 26.0–34.0)
MCHC: 34.2 g/dL (ref 30.0–36.0)
MCHC: 31.6 g/dL (ref 30.0–36.0)
MCV: 83.3 fL (ref 78.0–100.0)
MCV: 84.3 fL (ref 78.0–100.0)
PLATELETS: 220 10*3/uL (ref 150–400)
Platelets: 198 10*3/uL (ref 150–400)
RBC: 2.39 MIL/uL — AB (ref 4.22–5.81)
RBC: 2.74 MIL/uL — AB (ref 4.22–5.81)
RDW: 14.3 % (ref 11.5–15.5)
RDW: 14.3 % (ref 11.5–15.5)
WBC: 5.9 10*3/uL (ref 4.0–10.5)
WBC: 6 10*3/uL (ref 4.0–10.5)

## 2015-07-06 LAB — TYPE AND SCREEN
ABO/RH(D): O POS
Antibody Screen: NEGATIVE

## 2015-07-06 LAB — HEMOGLOBIN AND HEMATOCRIT, BLOOD
HEMATOCRIT: 22.6 % — AB (ref 39.0–52.0)
Hemoglobin: 7.4 g/dL — ABNORMAL LOW (ref 13.0–17.0)

## 2015-07-06 LAB — GLUCOSE, CAPILLARY
GLUCOSE-CAPILLARY: 133 mg/dL — AB (ref 65–99)
GLUCOSE-CAPILLARY: 182 mg/dL — AB (ref 65–99)
Glucose-Capillary: 125 mg/dL — ABNORMAL HIGH (ref 65–99)
Glucose-Capillary: 166 mg/dL — ABNORMAL HIGH (ref 65–99)

## 2015-07-06 MED ORDER — CEFAZOLIN SODIUM-DEXTROSE 2-3 GM-% IV SOLR
INTRAVENOUS | Status: AC
  Administered 2015-07-06: 2 g via INTRAVENOUS
  Filled 2015-07-06: qty 50

## 2015-07-06 MED ORDER — HEPARIN SODIUM (PORCINE) 1000 UNIT/ML IJ SOLN
INTRAMUSCULAR | Status: AC
  Filled 2015-07-06: qty 1

## 2015-07-06 MED ORDER — ALTEPLASE 2 MG IJ SOLR
2.0000 mg | Freq: Once | INTRAMUSCULAR | Status: DC | PRN
Start: 2015-07-06 — End: 2015-07-06

## 2015-07-06 MED ORDER — CEFAZOLIN SODIUM-DEXTROSE 2-3 GM-% IV SOLR
2.0000 g | INTRAVENOUS | Status: AC
  Administered 2015-07-06: 2 g via INTRAVENOUS

## 2015-07-06 MED ORDER — LIDOCAINE-PRILOCAINE 2.5-2.5 % EX CREA: 1.0000 "application " | TOPICAL_CREAM | CUTANEOUS | Status: DC | PRN

## 2015-07-06 MED ORDER — LIDOCAINE HCL 1 % IJ SOLN
INTRAMUSCULAR | Status: AC
  Filled 2015-07-06: qty 20

## 2015-07-06 MED ORDER — MIDAZOLAM HCL 2 MG/2ML IJ SOLN
INTRAMUSCULAR | Status: AC | PRN
  Administered 2015-07-06: 1 mg via INTRAVENOUS

## 2015-07-06 MED ORDER — SODIUM CHLORIDE 0.9 % IV SOLN: 100.0000 mL | INTRAVENOUS | Status: DC | PRN

## 2015-07-06 MED ORDER — HEPARIN SODIUM (PORCINE) 1000 UNIT/ML DIALYSIS: 1000.0000 [IU] | INTRAMUSCULAR | Status: DC | PRN

## 2015-07-06 MED ORDER — HEPARIN SODIUM (PORCINE) 1000 UNIT/ML DIALYSIS: 40.0000 [IU]/kg | Freq: Once | INTRAMUSCULAR | Status: DC

## 2015-07-06 MED ORDER — LIDOCAINE-EPINEPHRINE (PF) 1 %-1:200000 IJ SOLN
INTRAMUSCULAR | Status: AC
  Filled 2015-07-06: qty 30

## 2015-07-06 MED ORDER — FENTANYL CITRATE (PF) 100 MCG/2ML IJ SOLN
INTRAMUSCULAR | Status: AC | PRN
  Administered 2015-07-06 (×2): 50 ug via INTRAVENOUS

## 2015-07-06 MED ORDER — PENTAFLUOROPROP-TETRAFLUOROETH EX AERO: 1.0000 "application " | INHALATION_SPRAY | CUTANEOUS | Status: DC | PRN

## 2015-07-06 MED ORDER — LIDOCAINE HCL (PF) 1 % IJ SOLN: 5.0000 mL | INTRAMUSCULAR | Status: DC | PRN

## 2015-07-06 MED ORDER — MIDAZOLAM HCL 2 MG/2ML IJ SOLN
INTRAMUSCULAR | Status: AC
Start: 2015-07-06 — End: 2015-07-06
  Filled 2015-07-06: qty 4

## 2015-07-06 MED ORDER — RENA-VITE PO TABS
1.0000 | ORAL_TABLET | Freq: Every day | ORAL | Status: DC
  Administered 2015-07-07 – 2015-07-14 (×9): 1 via ORAL
  Filled 2015-07-06 (×9): qty 1

## 2015-07-06 MED ORDER — GELATIN ABSORBABLE 12-7 MM EX MISC
CUTANEOUS | Status: AC
  Filled 2015-07-06: qty 1

## 2015-07-06 MED ORDER — FENTANYL CITRATE (PF) 100 MCG/2ML IJ SOLN
INTRAMUSCULAR | Status: AC
  Filled 2015-07-06: qty 4

## 2015-07-06 NOTE — Sedation Documentation (Signed)
Patient is resting comfortably.  Denies pain.

## 2015-07-06 NOTE — Procedures (Signed)
I was present at this session.  I have reviewed the session itself and made appropriate changes.  HD vial PC, bfr 400, bp 130s  Dayleen Beske L 1/16/20174:42 PM

## 2015-07-06 NOTE — Sedation Documentation (Signed)
Patient is resting comfortably. 

## 2015-07-06 NOTE — Progress Notes (Signed)
Subjective: Interval History: has no complaint .  Objective: Vital signs in last 24 hours: Temp:  [98.4 F (36.9 C)-99.5 F (37.5 C)] 98.4 F (36.9 C) (01/16 1046) Pulse Rate:  [78-89] 82 (01/16 1046) Resp:  [12-20] 18 (01/16 1046) BP: (110-135)/(47-71) 122/54 mmHg (01/16 1046) SpO2:  [94 %-100 %] 96 % (01/16 1046) Weight change:   Intake/Output from previous day: 01/15 0701 - 01/16 0700 In: 600 [P.O.:600] Out: 401 [Urine:400; Stool:1] Intake/Output this shift:    General appearance: alert, cooperative, no distress and mildly obese Resp: diminished breath sounds bilaterally Chest wall: new RIJ cath Cardio: S1, S2 normal and systolic murmur: holosystolic 2/6, blowing at apex GI: obese, pos bs, liver down 5 cm Extremities: edema 2+ and dressing L foot at site of amp  Lab Results:  Recent Labs  07/05/15 0433 07/06/15 0606 07/06/15 1037  WBC 7.3 5.9  --   HGB 8.4* 6.8* 7.4*  HCT 25.0* 19.9* 22.6*  PLT 206 198  --    BMET:  Recent Labs  07/05/15 0433 07/06/15 0606  NA 131* 130*  K 3.6 3.6  CL 95* 96*  CO2 22 23  GLUCOSE 179* 148*  BUN 36* 47*  CREATININE 8.76* 10.18*  CALCIUM 8.1* 7.5*   No results for input(s): PTH in the last 72 hours. Iron Studies: No results for input(s): IRON, TIBC, TRANSFERRIN, FERRITIN in the last 72 hours.  Studies/Results: Ir Fluoro Guide Cv Line Right  07/06/2015  CLINICAL DATA:  Acute renal injury, hypertension, diabetes, no current permanent access. EXAM: ULTRASOUND GUIDANCE FOR VASCULAR ACCESS RIGHT INTERNAL JUGULAR PERMANENT HEMODIALYSIS CATHETER Date:  1/16/20171/16/2017 10:06 am Radiologist:  M. Daryll Brod, MD Guidance:  Ultrasound and fluoroscopic FLUOROSCOPY TIME:  24 seconds, 2 mGy MEDICATIONS AND MEDICAL HISTORY: 2 g Ancef administered within 1 hour of the procedure, 1 mg Versed, 100 mcg fentanyl ANESTHESIA/SEDATION: 15 minutes CONTRAST:  None. COMPLICATIONS: None immediate PROCEDURE: Informed consent was obtained from the  patient following explanation of the procedure, risks, benefits and alternatives. The patient understands, agrees and consents for the procedure. All questions were addressed. A time out was performed. Maximal barrier sterile technique utilized including caps, mask, sterile gowns, sterile gloves, large sterile drape, hand hygiene, and 2% chlorhexidine scrub. Under sterile conditions and local anesthesia, right internal jugular micropuncture venous access was performed with ultrasound. Images were obtained for documentation. A guide wire was inserted followed by a transitional dilator. Next, a 0.035 guidewire was advanced into the IVC with a 5-French catheter. Measurements were obtained from the right venotomy site to the proximal right atrium. In the right infraclavicular chest, a subcutaneous tunnel was created under sterile conditions and local anesthesia. 1% lidocaine with epinephrine was utilized for this. The 19 cm tip to cuff dialysis catheter was tunneled subcutaneously to the venotomy site and inserted into the SVC/RA junction through a valved peel-away sheath. Position was confirmed with fluoroscopy. Images were obtained for documentation. Blood was aspirated from the catheter followed by saline and heparin flushes. The appropriate volume and strength of heparin was instilled in each lumen. Caps were applied. The catheter was secured at the tunnel site with Gelfoam and a pursestring suture. The venotomy site was closed with subcuticular Vicryl suture. Dermabond was applied to the small right neck incision. A dry sterile dressing was applied. The catheter is ready for use. No immediate complications. IMPRESSION: Ultrasound and fluoroscopically guided right internal jugular tunneled hemodialysis catheter (19 cm tip to cuff palindrome dialysis catheter). Electronically Signed   By:  M.  Shick M.D.   On: 07/06/2015 11:35   Ir US Guide Vasc Access Right  07/06/2015  CLINICAL DATA:  Acute renal injury,  hypertension, diabetes, no current permanent access. EXAM: ULTRASOUND GUIDANCE FOR VASCULAR ACCESS RIGHT INTERNAL JUGULAR PERMANENT HEMODIALYSIS CATHETER Date:  1/16/20171/16/2017 10:06 am Radiologist:  M. Daryll Brod, MD Guidance:  Ultrasound and fluoroscopic FLUOROSCOPY TIME:  24 seconds, 2 mGy MEDICATIONS AND MEDICAL HISTORY: 2 g Ancef administered within 1 hour of the procedure, 1 mg Versed, 100 mcg fentanyl ANESTHESIA/SEDATION: 15 minutes CONTRAST:  None. COMPLICATIONS: None immediate PROCEDURE: Informed consent was obtained from the patient following explanation of the procedure, risks, benefits and alternatives. The patient understands, agrees and consents for the procedure. All questions were addressed. A time out was performed. Maximal barrier sterile technique utilized including caps, mask, sterile gowns, sterile gloves, large sterile drape, hand hygiene, and 2% chlorhexidine scrub. Under sterile conditions and local anesthesia, right internal jugular micropuncture venous access was performed with ultrasound. Images were obtained for documentation. A guide wire was inserted followed by a transitional dilator. Next, a 0.035 guidewire was advanced into the IVC with a 5-French catheter. Measurements were obtained from the right venotomy site to the proximal right atrium. In the right infraclavicular chest, a subcutaneous tunnel was created under sterile conditions and local anesthesia. 1% lidocaine with epinephrine was utilized for this. The 19 cm tip to cuff dialysis catheter was tunneled subcutaneously to the venotomy site and inserted into the SVC/RA junction through a valved peel-away sheath. Position was confirmed with fluoroscopy. Images were obtained for documentation. Blood was aspirated from the catheter followed by saline and heparin flushes. The appropriate volume and strength of heparin was instilled in each lumen. Caps were applied. The catheter was secured at the tunnel site with Gelfoam and a  pursestring suture. The venotomy site was closed with subcuticular Vicryl suture. Dermabond was applied to the small right neck incision. A dry sterile dressing was applied. The catheter is ready for use. No immediate complications. IMPRESSION: Ultrasound and fluoroscopically guided right internal jugular tunneled hemodialysis catheter (19 cm tip to cuff palindrome dialysis catheter). Electronically Signed   By: Jerilynn Mages.  Shick M.D.   On: 07/06/2015 11:35    I have reviewed the patient's current medications.  Assessment/Plan: 1 AKI very little urine with foley.  Worsening chem, needs HD, will do today. 2 Gangrenous foot on AB 3 Anemia  4 HPHT 5 Rhabdio 6 Afib 7 DM controlled. 8 HTN lower vol P HD, lower vol , lower meds, follow chem,    LOS: 19 days   [place] Coupland L 07/06/2015,11:38 AM

## 2015-07-06 NOTE — Progress Notes (Signed)
[facility] Progress Note   David Ford  [id number]  DOB: [birth year]  DOA: 06/17/2015 PCP: No primary care provider on file.  Brief narrative: David Ford is a 75 y.o. male with DM, HTN admitted after a fall from his couch and being on the floor for about 3 days. His found by a friend who then called EMS. The patient was noted to have rhabdomyolysis, glucose of 454, acute renal failure with a creatinine of 4 and a potassium of 6.0. WBC count was also elevated at 16.2. He was noted to have gangrenous toes on his left foot.   Subjective:  No complaints today. Diarrhea improved.    Assessment/Plan: Principal Problem:   Diabetic foot infection -Initially started on vancomycin and Zosyn-vancomycin discontinued -s/p amputation of left 1st and 2nd ray on 1/13- zosyn discontinued   Active Problems: Rhabdomyolysis -Steadily resolved  Acute renal failure --Started on dialysis treatments when first admitted and continues to require dialysis -Nephrology team managing  Upper GI bleed -Suspected be secondary to NG tube insertion and gastritis  AOCD - transfused 2 U PRBC for Hb of 6.6  Partial small bowel obstruction -Resolved with conservative management   Atrial fibrillation with RVR -Occurred on hemodialysis on 12/1 -2-D echo revealed EF of 50-55% and grade 1 diastolic dysfunction -TSH was normal at 0.653 on 12/31 - has not had any further episodes noted since then- will hold off on anticoagulation   CAD - mild elevation in Troponin- ECHO without WMA- cardiology following  DM2 - cont Lantus and Insulin  Essential HTN - coreg and Hydralazine   Antibiotics: Anti-infectives    Start     Dose/Rate Route Frequency Ordered Stop   07/06/15 0830  ceFAZolin (ANCEF) IVPB 2 g/50 mL premix     2 g 100 mL/hr over 30 Minutes Intravenous To Radiology 07/06/15 0817 07/06/15 0945   07/04/15 0100  ceFAZolin (ANCEF) IVPB 1 g/50 mL premix     1 g 100 mL/hr over 30  Minutes Intravenous Every 6 hours 07/03/15 2051 07/04/15 1758   06/28/15 2200  piperacillin-tazobactam (ZOSYN) IVPB 2.25 g     2.25 g 100 mL/hr over 30 Minutes Intravenous 3 times per day 06/28/15 1737 07/02/15 0559   06/28/15 1945  piperacillin-tazobactam (ZOSYN) IVPB 2.25 g  Status:  Discontinued     2.25 g 100 mL/hr over 30 Minutes Intravenous Every 6 hours 06/28/15 1736 06/28/15 1737   06/23/15 0100  piperacillin-tazobactam (ZOSYN) IVPB 2.25 g  Status:  Discontinued     2.25 g 100 mL/hr over 30 Minutes Intravenous Every 8 hours 06/22/15 1912 06/28/15 1735   06/22/15 1600  vancomycin (VANCOCIN) IVPB 1000 mg/200 mL premix     1,000 mg 200 mL/hr over 60 Minutes Intravenous  Once 06/22/15 1448 06/22/15 1730   06/20/15 1400  vancomycin (VANCOCIN) IVPB 1000 mg/200 mL premix     1,000 mg 200 mL/hr over 60 Minutes Intravenous  Once 06/20/15 1205 06/20/15 1538   06/19/15 2100  vancomycin (VANCOCIN) IVPB 1000 mg/200 mL premix  Status:  Discontinued     1,000 mg 200 mL/hr over 60 Minutes Intravenous Every 48 hours 06/17/15 2214 06/18/15 0945   06/18/15 1200  vancomycin (VANCOCIN) IVPB 1000 mg/200 mL premix  Status:  Discontinued     1,000 mg 200 mL/hr over 60 Minutes Intravenous Every 48 hours 06/18/15 0945 06/20/15 1204   06/18/15 0530  piperacillin-tazobactam (ZOSYN) IVPB 2.25 g  Status:  Discontinued     2.25 g 100 mL/hr over 30  Minutes Intravenous Every 8 hours 06/17/15 2214 06/22/15 1912   06/17/15 2115  piperacillin-tazobactam (ZOSYN) IVPB 3.375 g     3.375 g 100 mL/hr over 30 Minutes Intravenous  Once 06/17/15 2111 06/17/15 2151   06/17/15 2115  vancomycin (VANCOCIN) IVPB 1000 mg/200 mL premix     1,000 mg 200 mL/hr over 60 Minutes Intravenous  Once 06/17/15 2111 06/18/15 0235     Code Status:     Code Status Orders        Start     Ordered   06/17/15 2215  Full code   Continuous     06/17/15 2214    Code Status History    Date Active Date Inactive Code Status Order ID  Comments User Context   This patient has a current code status but no historical code status.     Family Communication:  Disposition Plan: awaiting amputation DVT prophylaxis: Heparin Consultants: Nephrology, Ortho, Cardiology  Procedures:      Objective: Filed Weights   07/03/15 0656 07/03/15 1100 07/04/15 2257  Weight: 99.5 kg (219 lb 5.7 oz) 96.5 kg (212 lb 11.9 oz) 96.5 kg (212 lb 11.9 oz)    Intake/Output Summary (Last 24 hours) at 07/06/15 1222 Last data filed at 07/06/15 1046  Gross per 24 hour  Intake    360 ml  Output    100 ml  Net    260 ml     Vitals Filed Vitals:   07/06/15 1000 07/06/15 1005 07/06/15 1010 07/06/15 1046  BP: 110/71 114/64 130/68 122/54  Pulse: 89 88 87 82  Temp:    98.4 F (36.9 C)  TempSrc:    Oral  Resp: 14 14 18 18   Height:      Weight:      SpO2: 94% 99% 99% 96%    Exam:  General:  Pt is alert, not in acute distress  HEENT: No icterus, No thrush, oral mucosa moist  Cardiovascular: regular rate and rhythm, S1/S2 No murmur  Respiratory: clear to auscultation bilaterally   Abdomen: Soft, +Bowel sounds, non tender, non distended, no guarding  MSK: No cyanosis or clubbing- no pedal edema- 1st and 2nd ray amputation   Data Reviewed: Basic Metabolic Panel:  Recent Labs Lab 07/02/15 0630 07/03/15 0635 07/04/15 0549 07/05/15 0433 07/06/15 0606  NA 136  135 132* 133* 131* 130*  K 3.4*  3.4* 3.2* 3.9 3.6 3.6  CL 100*  99* 98* 100* 95* 96*  CO2 27  27 24  21* 22 23  GLUCOSE 98  98 66 127* 179* 148*  BUN 22*  21* 28* 26* 36* 47*  CREATININE 5.76*  5.77* 7.69* 6.88* 8.76* 10.18*  CALCIUM 7.6*  7.6* 7.8* 7.8* 8.1* 7.5*  PHOS 5.1* 5.6* 5.4* 6.9* 5.8*   Liver Function Tests:  Recent Labs Lab 07/02/15 0630 07/03/15 0635 07/04/15 0549 07/05/15 0433 07/06/15 0606  ALBUMIN 1.6* 1.7* 1.8* 1.7* 1.5*   No results for input(s): LIPASE, AMYLASE in the last 168 hours. No results for input(s): AMMONIA in the last 168  hours. CBC:  Recent Labs Lab 07/02/15 0630  07/03/15 0635 07/04/15 0549 07/05/15 0433 07/06/15 0606 07/06/15 1037  WBC 5.9  --  7.7 7.6 7.3 5.9  --   HGB 6.6*  < > 8.9* 8.9* 8.4* 6.8* 7.4*  HCT 20.9*  < > 26.5* 26.5* 25.0* 19.9* 22.6*  MCV 85.7  --  84.4 83.9 84.2 83.3  --   PLT 294  --  281 PLATELET CLUMPS  NOTED ON SMEAR 206 198  --   < > = values in this interval not displayed. Cardiac Enzymes: No results for input(s): CKTOTAL, CKMB, CKMBINDEX, TROPONINI in the last 168 hours. BNP (last 3 results) No results for input(s): BNP in the last 8760 hours.  ProBNP (last 3 results) No results for input(s): PROBNP in the last 8760 hours.  CBG:  Recent Labs Lab 07/05/15 1128 07/05/15 1724 07/05/15 2054 07/06/15 0746 07/06/15 1136  GLUCAP 206* 216* 132* 133* 125*    Recent Results (from the past 240 hour(s))  Surgical pcr screen     Status: None   Collection Time: 07/03/15  2:00 PM  Result Value Ref Range Status   MRSA, PCR NEGATIVE NEGATIVE Final   Staphylococcus aureus NEGATIVE NEGATIVE Final    Comment:        The Xpert SA Assay (FDA approved for NASAL specimens in patients over 18 years of age), is one component of a comprehensive surveillance program.  Test performance has been validated by [facility] for patients greater than or equal to 81 year old. It is not intended to diagnose infection nor to guide or monitor treatment.   C difficile quick scan w PCR reflex     Status: None   Collection Time: 07/05/15  9:47 AM  Result Value Ref Range Status   C Diff antigen NEGATIVE NEGATIVE Final   C Diff toxin NEGATIVE NEGATIVE Final   C Diff interpretation Negative for toxigenic C. difficile  Final     Studies: Ir Fluoro Guide Cv Line Right  07/06/2015  CLINICAL DATA:  Acute renal injury, hypertension, diabetes, no current permanent access. EXAM: ULTRASOUND GUIDANCE FOR VASCULAR ACCESS RIGHT INTERNAL JUGULAR PERMANENT HEMODIALYSIS CATHETER Date:   1/16/20171/16/2017 10:06 am Radiologist:  M. Daryll Brod, MD Guidance:  Ultrasound and fluoroscopic FLUOROSCOPY TIME:  24 seconds, 2 mGy MEDICATIONS AND MEDICAL HISTORY: 2 g Ancef administered within 1 hour of the procedure, 1 mg Versed, 100 mcg fentanyl ANESTHESIA/SEDATION: 15 minutes CONTRAST:  None. COMPLICATIONS: None immediate PROCEDURE: Informed consent was obtained from the patient following explanation of the procedure, risks, benefits and alternatives. The patient understands, agrees and consents for the procedure. All questions were addressed. A time out was performed. Maximal barrier sterile technique utilized including caps, mask, sterile gowns, sterile gloves, large sterile drape, hand hygiene, and 2% chlorhexidine scrub. Under sterile conditions and local anesthesia, right internal jugular micropuncture venous access was performed with ultrasound. Images were obtained for documentation. A guide wire was inserted followed by a transitional dilator. Next, a 0.035 guidewire was advanced into the IVC with a 5-French catheter. Measurements were obtained from the right venotomy site to the proximal right atrium. In the right infraclavicular chest, a subcutaneous tunnel was created under sterile conditions and local anesthesia. 1% lidocaine with epinephrine was utilized for this. The 19 cm tip to cuff dialysis catheter was tunneled subcutaneously to the venotomy site and inserted into the SVC/RA junction through a valved peel-away sheath. Position was confirmed with fluoroscopy. Images were obtained for documentation. Blood was aspirated from the catheter followed by saline and heparin flushes. The appropriate volume and strength of heparin was instilled in each lumen. Caps were applied. The catheter was secured at the tunnel site with Gelfoam and a pursestring suture. The venotomy site was closed with subcuticular Vicryl suture. Dermabond was applied to the small right neck incision. A dry sterile dressing  was applied. The catheter is ready for use. No immediate complications. IMPRESSION: Ultrasound and fluoroscopically  guided right internal jugular tunneled hemodialysis catheter (19 cm tip to cuff palindrome dialysis catheter). Electronically Signed   By: Jerilynn Mages.  Shick M.D.   On: 07/06/2015 11:35   Ir US Guide Vasc Access Right  07/06/2015  CLINICAL DATA:  Acute renal injury, hypertension, diabetes, no current permanent access. EXAM: ULTRASOUND GUIDANCE FOR VASCULAR ACCESS RIGHT INTERNAL JUGULAR PERMANENT HEMODIALYSIS CATHETER Date:  1/16/20171/16/2017 10:06 am Radiologist:  M. Daryll Brod, MD Guidance:  Ultrasound and fluoroscopic FLUOROSCOPY TIME:  24 seconds, 2 mGy MEDICATIONS AND MEDICAL HISTORY: 2 g Ancef administered within 1 hour of the procedure, 1 mg Versed, 100 mcg fentanyl ANESTHESIA/SEDATION: 15 minutes CONTRAST:  None. COMPLICATIONS: None immediate PROCEDURE: Informed consent was obtained from the patient following explanation of the procedure, risks, benefits and alternatives. The patient understands, agrees and consents for the procedure. All questions were addressed. A time out was performed. Maximal barrier sterile technique utilized including caps, mask, sterile gowns, sterile gloves, large sterile drape, hand hygiene, and 2% chlorhexidine scrub. Under sterile conditions and local anesthesia, right internal jugular micropuncture venous access was performed with ultrasound. Images were obtained for documentation. A guide wire was inserted followed by a transitional dilator. Next, a 0.035 guidewire was advanced into the IVC with a 5-French catheter. Measurements were obtained from the right venotomy site to the proximal right atrium. In the right infraclavicular chest, a subcutaneous tunnel was created under sterile conditions and local anesthesia. 1% lidocaine with epinephrine was utilized for this. The 19 cm tip to cuff dialysis catheter was tunneled subcutaneously to the venotomy site and inserted  into the SVC/RA junction through a valved peel-away sheath. Position was confirmed with fluoroscopy. Images were obtained for documentation. Blood was aspirated from the catheter followed by saline and heparin flushes. The appropriate volume and strength of heparin was instilled in each lumen. Caps were applied. The catheter was secured at the tunnel site with Gelfoam and a pursestring suture. The venotomy site was closed with subcuticular Vicryl suture. Dermabond was applied to the small right neck incision. A dry sterile dressing was applied. The catheter is ready for use. No immediate complications. IMPRESSION: Ultrasound and fluoroscopically guided right internal jugular tunneled hemodialysis catheter (19 cm tip to cuff palindrome dialysis catheter). Electronically Signed   By: Jerilynn Mages.  Shick M.D.   On: 07/06/2015 11:35    Scheduled Meds:  Scheduled Meds: . antiseptic oral rinse  7 mL Mouth Rinse q12n4p  . bisacodyl  10 mg Rectal Once  . brimonidine  1 drop Both Eyes BID  . carvedilol  6.25 mg Oral BID WC  . chlorhexidine  15 mL Mouth Rinse BID  . darbepoetin (ARANESP) injection - DIALYSIS  100 mcg Intravenous Q Fri-HD  . feeding supplement (NEPRO CARB STEADY)  237 mL Oral BID BM  . fentaNYL      . fluticasone  1 spray Each Nare Daily  . gabapentin  300 mg Oral BID  . gelatin adsorbable      . heparin      . insulin aspart  0-15 Units Subcutaneous TID WC  . insulin aspart  0-5 Units Subcutaneous QHS  . insulin aspart  6 Units Subcutaneous TID WC  . insulin glargine  15 Units Subcutaneous Daily  . latanoprost  1 drop Both Eyes QHS  . lidocaine      . lidocaine-EPINEPHrine      . midazolam      . multivitamin  1 tablet Oral QHS  . pantoprazole  40 mg Oral BID  .  polyethylene glycol  17 g Oral Daily  . rosuvastatin  20 mg Oral Daily  . senna-docusate  1 tablet Oral BID  . sevelamer carbonate  1,600 mg Oral TID WC  . sodium chloride  3 mL Intravenous Q12H   Continuous Infusions: .  sodium chloride 10 mL/hr at 07/03/15 1819  . sodium chloride 10 mL/hr at 07/03/15 2207    Time spent on care of this patient: 53 min   [place], MD 07/06/2015, 12:22 PM  LOS: 19 days   [facility] Office  [phone] Pager - Text Page per [email or web address] If 7PM-7AM, please contact night-coverage [email or web address]

## 2015-07-06 NOTE — Progress Notes (Signed)
Orthopedic Tech Progress Note Patient Details:  David Ford [birth year] [id number]  Ortho Devices Type of Ortho Device: Postop shoe/boot Ortho Device/Splint Location: lle Ortho Device/Splint Interventions: Application   David Ford 07/06/2015, 8:53 AM

## 2015-07-06 NOTE — Progress Notes (Signed)
Dr. Jimmy Footman at bedside to assess patient. When questioned, he stated the patient's foley catheter needs to be left in place for at least the next 24-48 hours. Will monitor.  Joellen Jersey, RN.

## 2015-07-06 NOTE — Sedation Documentation (Addendum)
Patient grimacing 

## 2015-07-06 NOTE — Progress Notes (Signed)
CRITICAL LAB VALUE HEMOGLOBIN: 6.8 MD notified.  Ermalinda Memos, RN

## 2015-07-06 NOTE — Procedures (Signed)
Successful RT IJ TUNNELED HD CATH NO COMP STABLE EBL 0 TIP SVC/RA READY FOR USE

## 2015-07-06 NOTE — Sedation Documentation (Signed)
Successful HD cath placed R side

## 2015-07-07 ENCOUNTER — Inpatient Hospital Stay (HOSPITAL_COMMUNITY): Payer: Medicare HMO

## 2015-07-07 DIAGNOSIS — Z794 Long term (current) use of insulin: Secondary | ICD-10-CM

## 2015-07-07 DIAGNOSIS — E1121 Type 2 diabetes mellitus with diabetic nephropathy: Secondary | ICD-10-CM

## 2015-07-07 LAB — CBC
HCT: 21.6 % — ABNORMAL LOW (ref 39.0–52.0)
Hemoglobin: 7.1 g/dL — ABNORMAL LOW (ref 13.0–17.0)
MCH: 27.7 pg (ref 26.0–34.0)
MCHC: 32.9 g/dL (ref 30.0–36.0)
MCV: 84.4 fL (ref 78.0–100.0)
PLATELETS: 204 10*3/uL (ref 150–400)
RBC: 2.56 MIL/uL — ABNORMAL LOW (ref 4.22–5.81)
RDW: 14.3 % (ref 11.5–15.5)
WBC: 4.9 10*3/uL (ref 4.0–10.5)

## 2015-07-07 LAB — RENAL FUNCTION PANEL
Albumin: 1.6 g/dL — ABNORMAL LOW (ref 3.5–5.0)
Anion gap: 14 (ref 5–15)
BUN: 28 mg/dL — AB (ref 6–20)
CALCIUM: 7.1 mg/dL — AB (ref 8.9–10.3)
CO2: 23 mmol/L (ref 22–32)
CREATININE: 7.17 mg/dL — AB (ref 0.61–1.24)
Chloride: 96 mmol/L — ABNORMAL LOW (ref 101–111)
GFR calc Af Amer: 8 mL/min — ABNORMAL LOW (ref >60)
GFR calc non Af Amer: 7 mL/min — ABNORMAL LOW (ref >60)
GLUCOSE: 109 mg/dL — AB (ref 65–99)
Phosphorus: 4 mg/dL (ref 2.5–4.6)
Potassium: 3.6 mmol/L (ref 3.5–5.1)
SODIUM: 133 mmol/L — AB (ref 135–145)

## 2015-07-07 LAB — GLUCOSE, CAPILLARY
GLUCOSE-CAPILLARY: 119 mg/dL — AB (ref 65–99)
Glucose-Capillary: 160 mg/dL — ABNORMAL HIGH (ref 65–99)
Glucose-Capillary: 114 mg/dL — ABNORMAL HIGH (ref 65–99)
Glucose-Capillary: 176 mg/dL — ABNORMAL HIGH (ref 65–99)

## 2015-07-07 MED ORDER — HYDROCODONE-ACETAMINOPHEN 7.5-325 MG/15ML PO SOLN: 10.0000 mL | ORAL | Status: DC | PRN

## 2015-07-07 MED ORDER — DARBEPOETIN ALFA 200 MCG/0.4ML IJ SOSY
200.0000 ug | PREFILLED_SYRINGE | INTRAMUSCULAR | Status: DC
  Administered 2015-07-10: 200 ug via INTRAVENOUS
  Filled 2015-07-07: qty 0.4

## 2015-07-07 MED ORDER — DICLOFENAC SODIUM 1 % TD GEL
2.0000 g | Freq: Four times a day (QID) | TRANSDERMAL | Status: DC
  Administered 2015-07-07 – 2015-07-15 (×23): 2 g via TOPICAL
  Filled 2015-07-07 (×2): qty 100

## 2015-07-07 MED ORDER — CARVEDILOL 3.125 MG PO TABS
3.1250 mg | ORAL_TABLET | Freq: Two times a day (BID) | ORAL | Status: DC
  Administered 2015-07-07 – 2015-07-15 (×15): 3.125 mg via ORAL
  Filled 2015-07-07 (×14): qty 1

## 2015-07-07 MED ORDER — PANTOPRAZOLE SODIUM 40 MG PO TBEC
40.0000 mg | DELAYED_RELEASE_TABLET | Freq: Every day | ORAL | Status: DC
  Administered 2015-07-08 – 2015-07-15 (×7): 40 mg via ORAL
  Filled 2015-07-07 (×8): qty 1

## 2015-07-07 MED ORDER — CYCLOBENZAPRINE HCL 5 MG PO TABS
7.5000 mg | ORAL_TABLET | Freq: Three times a day (TID) | ORAL | Status: DC
  Administered 2015-07-07 – 2015-07-15 (×24): 7.5 mg via ORAL
  Filled 2015-07-07 (×24): qty 2

## 2015-07-07 NOTE — Care Management Important Message (Signed)
Important Message  Patient Details  Name: Arzell Hall MRN: [id number] Date of Birth: July 24, 1940   Medicare Important Message Given:  Yes    Barb Merino Elisavet Buehrer 07/07/2015, 3:32 PM

## 2015-07-07 NOTE — Progress Notes (Signed)
[facility] Progress Note   Berkay Pignone  [id number]  DOB: 21-Jul-1940  DOA: 06/17/2015 PCP: No primary care provider on file.  Brief narrative: David Ford is a 75 y.o. male with DM, HTN admitted after a fall from his couch and being on the floor for about 3 days. His found by a friend who then called EMS. The patient was noted to have rhabdomyolysis, glucose of 454, acute renal failure with a creatinine of 4 and a potassium of 6.0. WBC count was also elevated at 16.2. He was noted to have gangrenous toes on his left foot. He underwent amputation on 1/13. He is still requiring dialysis and the renal team is still trying to determine if his renal function will recuperated. He received a temp right IJ tunneled dialysis cath on 1/16.  Renal team attempting to arrange outpatient dialysis for him.    Subjective: Pain in back of  neck started last night- has heat packs but is unable to turn head today.   Assessment/Plan: Principal Problem:   Diabetic foot infection -Initially started on vancomycin and Zosyn-vancomycin discontinued -s/p amputation of left 1st and 2nd ray on 1/13- zosyn subsequently discontinued   Active Problems:  Neck pain - possibly sprain - have ordered Voltaren gel, Flexeril Q 8 hrs and Narcotics PRN if pain is significant - as he is unable to turn or flex his head, will obtain c spine xray for completeness   Severe Rhabdomyolysis -Steadily resolved  Acute renal failure - possibly a result of above Rhabdomyolysis and sepsis from gangrenous toes --Started on dialysis treatments when first admitted and continues to require dialysis -Nephrology team managing  Upper GI bleed -Suspected be secondary to NG tube insertion and gastritis - was started on BID Protonix which I have decreased to daily now  AOCD - transfused 2 U PRBC for Hb of 6.6 - cont to follow Hb and transfuse as needed for Hb < 7  Partial small bowel obstruction -Resolved with  conservative management   Atrial fibrillation with RVR -Occurred on hemodialysis on 12/1 -2-D echo revealed EF of 50-55% and grade 1 diastolic dysfunction -TSH was normal at 0.653 on 12/31 - has not had any further episodes noted since then- will hold off on anticoagulation   CAD - mild elevation in Troponin- ECHO without WMA- cardiology following  DM2 - cont Lantus and Insulin  Essential HTN - coreg and Hydralazine   Antibiotics: Anti-infectives    Start     Dose/Rate Route Frequency Ordered Stop   07/06/15 0830  ceFAZolin (ANCEF) IVPB 2 g/50 mL premix     2 g 100 mL/hr over 30 Minutes Intravenous To Radiology 07/06/15 0817 07/06/15 0945   07/04/15 0100  ceFAZolin (ANCEF) IVPB 1 g/50 mL premix     1 g 100 mL/hr over 30 Minutes Intravenous Every 6 hours 07/03/15 2051 07/04/15 1758   06/28/15 2200  piperacillin-tazobactam (ZOSYN) IVPB 2.25 g     2.25 g 100 mL/hr over 30 Minutes Intravenous 3 times per day 06/28/15 1737 07/02/15 0559   06/28/15 1945  piperacillin-tazobactam (ZOSYN) IVPB 2.25 g  Status:  Discontinued     2.25 g 100 mL/hr over 30 Minutes Intravenous Every 6 hours 06/28/15 1736 06/28/15 1737   06/23/15 0100  piperacillin-tazobactam (ZOSYN) IVPB 2.25 g  Status:  Discontinued     2.25 g 100 mL/hr over 30 Minutes Intravenous Every 8 hours 06/22/15 1912 06/28/15 1735   06/22/15 1600  vancomycin (VANCOCIN) IVPB 1000 mg/200 mL premix  1,000 mg 200 mL/hr over 60 Minutes Intravenous  Once 06/22/15 1448 06/22/15 1730   06/20/15 1400  vancomycin (VANCOCIN) IVPB 1000 mg/200 mL premix     1,000 mg 200 mL/hr over 60 Minutes Intravenous  Once 06/20/15 1205 06/20/15 1538   06/19/15 2100  vancomycin (VANCOCIN) IVPB 1000 mg/200 mL premix  Status:  Discontinued     1,000 mg 200 mL/hr over 60 Minutes Intravenous Every 48 hours 06/17/15 2214 06/18/15 0945   06/18/15 1200  vancomycin (VANCOCIN) IVPB 1000 mg/200 mL premix  Status:  Discontinued     1,000 mg 200 mL/hr over 60  Minutes Intravenous Every 48 hours 06/18/15 0945 06/20/15 1204   06/18/15 0530  piperacillin-tazobactam (ZOSYN) IVPB 2.25 g  Status:  Discontinued     2.25 g 100 mL/hr over 30 Minutes Intravenous Every 8 hours 06/17/15 2214 06/22/15 1912   06/17/15 2115  piperacillin-tazobactam (ZOSYN) IVPB 3.375 g     3.375 g 100 mL/hr over 30 Minutes Intravenous  Once 06/17/15 2111 06/17/15 2151   06/17/15 2115  vancomycin (VANCOCIN) IVPB 1000 mg/200 mL premix     1,000 mg 200 mL/hr over 60 Minutes Intravenous  Once 06/17/15 2111 06/18/15 0235     Code Status:     Code Status Orders        Start     Ordered   06/17/15 2215  Full code   Continuous     06/17/15 2214    Code Status History    Date Active Date Inactive Code Status Order ID Comments User Context   This patient has a current code status but no historical code status.     Family Communication:  Disposition Plan: awaiting amputation DVT prophylaxis: Heparin Consultants: Nephrology, Ortho, Cardiology  Procedures:      Objective: Filed Weights   07/04/15 2257 07/06/15 1350 07/06/15 1800  Weight: 96.5 kg (212 lb 11.9 oz) 98.8 kg (217 lb 13 oz) 96.8 kg (213 lb 6.5 oz)    Intake/Output Summary (Last 24 hours) at 07/07/15 1007 Last data filed at 07/07/15 0600  Gross per 24 hour  Intake    720 ml  Output   2350 ml  Net  -1630 ml     Vitals Filed Vitals:   07/06/15 1827 07/06/15 2100 07/07/15 0500 07/07/15 0835  BP: 149/51 122/53 122/75 138/68  Pulse: 97 91 99 80  Temp: 99 F (37.2 C) 100.1 F (37.8 C) 98.6 F (37 C) 98.4 F (36.9 C)  TempSrc: Oral Oral Oral Oral  Resp: 20 18 18 18   Height:      Weight:      SpO2: 98% 96% 99% 98%    Exam:  General:  Pt is alert, not in acute distress  HEENT: No icterus, No thrush, oral mucosa moist, mild tenderness in right trapezius, not able to participate in any ROM exercises, head rotated and flexed to the left  Cardiovascular: regular rate and rhythm, S1/S2 No  murmur  Respiratory: clear to auscultation bilaterally   Abdomen: Soft, +Bowel sounds, non tender, non distended, no guarding  MSK: No cyanosis or clubbing- no pedal edema- 1st and 2nd ray amputation   Data Reviewed: Basic Metabolic Panel:  Recent Labs Lab 07/03/15 0635 07/04/15 0549 07/05/15 0433 07/06/15 0606 07/06/15 1414  NA 132* 133* 131* 130* 130*  K 3.2* 3.9 3.6 3.6 3.5  CL 98* 100* 95* 96* 96*  CO2 24 21* 22 23 23   GLUCOSE 66 127* 179* 148* 164*  BUN 28* 26*  36* 47* 48*  CREATININE 7.69* 6.88* 8.76* 10.18* 10.97*  CALCIUM 7.8* 7.8* 8.1* 7.5* 7.4*  PHOS 5.6* 5.4* 6.9* 5.8* 6.0*   Liver Function Tests:  Recent Labs Lab 07/03/15 [id number] 07/04/15 0549 07/05/15 0433 07/06/15 0606 07/06/15 1414  ALBUMIN 1.7* 1.8* 1.7* 1.5* 1.6*   No results for input(s): LIPASE, AMYLASE in the last 168 hours. No results for input(s): AMMONIA in the last 168 hours. CBC:  Recent Labs Lab 07/04/15 0549 07/05/15 0433 07/06/15 0606 07/06/15 1037 07/06/15 1414 07/07/15 0801  WBC 7.6 7.3 5.9  --  6.0 4.9  HGB 8.9* 8.4* 6.8* 7.4* 7.3* 7.1*  HCT 26.5* 25.0* 19.9* 22.6* 23.1* 21.6*  MCV 83.9 84.2 83.3  --  84.3 84.4  PLT PLATELET CLUMPS NOTED ON SMEAR 206 198  --  220 204   Cardiac Enzymes: No results for input(s): CKTOTAL, CKMB, CKMBINDEX, TROPONINI in the last 168 hours. BNP (last 3 results) No results for input(s): BNP in the last 8760 hours.  ProBNP (last 3 results) No results for input(s): PROBNP in the last 8760 hours.  CBG:  Recent Labs Lab 07/06/15 0746 07/06/15 1136 07/06/15 1831 07/06/15 2109 07/07/15 0805  GLUCAP 133* 125* 182* 166* 119*    Recent Results (from the past 240 hour(s))  Surgical pcr screen     Status: None   Collection Time: 07/03/15  2:00 PM  Result Value Ref Range Status   MRSA, PCR NEGATIVE NEGATIVE Final   Staphylococcus aureus NEGATIVE NEGATIVE Final    Comment:        The Xpert SA Assay (FDA approved for NASAL specimens in  patients over 90 years of age), is one component of a comprehensive surveillance program.  Test performance has been validated by [facility] for patients greater than or equal to 40 year old. It is not intended to diagnose infection nor to guide or monitor treatment.   C difficile quick scan w PCR reflex     Status: None   Collection Time: 07/05/15  9:47 AM  Result Value Ref Range Status   C Diff antigen NEGATIVE NEGATIVE Final   C Diff toxin NEGATIVE NEGATIVE Final   C Diff interpretation Negative for toxigenic C. difficile  Final     Studies: Ir Fluoro Guide Cv Line Right  07/06/2015  CLINICAL DATA:  Acute renal injury, hypertension, diabetes, no current permanent access. EXAM: ULTRASOUND GUIDANCE FOR VASCULAR ACCESS RIGHT INTERNAL JUGULAR PERMANENT HEMODIALYSIS CATHETER Date:  1/16/20171/16/2017 10:06 am Radiologist:  M. Daryll Brod, MD Guidance:  Ultrasound and fluoroscopic FLUOROSCOPY TIME:  24 seconds, 2 mGy MEDICATIONS AND MEDICAL HISTORY: 2 g Ancef administered within 1 hour of the procedure, 1 mg Versed, 100 mcg fentanyl ANESTHESIA/SEDATION: 15 minutes CONTRAST:  None. COMPLICATIONS: None immediate PROCEDURE: Informed consent was obtained from the patient following explanation of the procedure, risks, benefits and alternatives. The patient understands, agrees and consents for the procedure. All questions were addressed. A time out was performed. Maximal barrier sterile technique utilized including caps, mask, sterile gowns, sterile gloves, large sterile drape, hand hygiene, and 2% chlorhexidine scrub. Under sterile conditions and local anesthesia, right internal jugular micropuncture venous access was performed with ultrasound. Images were obtained for documentation. A guide wire was inserted followed by a transitional dilator. Next, a 0.035 guidewire was advanced into the IVC with a 5-French catheter. Measurements were obtained from the right venotomy site to the proximal right  atrium. In the right infraclavicular chest, a subcutaneous tunnel was created under sterile conditions and  local anesthesia. 1% lidocaine with epinephrine was utilized for this. The 19 cm tip to cuff dialysis catheter was tunneled subcutaneously to the venotomy site and inserted into the SVC/RA junction through a valved peel-away sheath. Position was confirmed with fluoroscopy. Images were obtained for documentation. Blood was aspirated from the catheter followed by saline and heparin flushes. The appropriate volume and strength of heparin was instilled in each lumen. Caps were applied. The catheter was secured at the tunnel site with Gelfoam and a pursestring suture. The venotomy site was closed with subcuticular Vicryl suture. Dermabond was applied to the small right neck incision. A dry sterile dressing was applied. The catheter is ready for use. No immediate complications. IMPRESSION: Ultrasound and fluoroscopically guided right internal jugular tunneled hemodialysis catheter (19 cm tip to cuff palindrome dialysis catheter). Electronically Signed   By: Jerilynn Mages.  Shick M.D.   On: 07/06/2015 11:35   Ir US Guide Vasc Access Right  07/06/2015  CLINICAL DATA:  Acute renal injury, hypertension, diabetes, no current permanent access. EXAM: ULTRASOUND GUIDANCE FOR VASCULAR ACCESS RIGHT INTERNAL JUGULAR PERMANENT HEMODIALYSIS CATHETER Date:  1/16/20171/16/2017 10:06 am Radiologist:  M. Daryll Brod, MD Guidance:  Ultrasound and fluoroscopic FLUOROSCOPY TIME:  24 seconds, 2 mGy MEDICATIONS AND MEDICAL HISTORY: 2 g Ancef administered within 1 hour of the procedure, 1 mg Versed, 100 mcg fentanyl ANESTHESIA/SEDATION: 15 minutes CONTRAST:  None. COMPLICATIONS: None immediate PROCEDURE: Informed consent was obtained from the patient following explanation of the procedure, risks, benefits and alternatives. The patient understands, agrees and consents for the procedure. All questions were addressed. A time out was performed.  Maximal barrier sterile technique utilized including caps, mask, sterile gowns, sterile gloves, large sterile drape, hand hygiene, and 2% chlorhexidine scrub. Under sterile conditions and local anesthesia, right internal jugular micropuncture venous access was performed with ultrasound. Images were obtained for documentation. A guide wire was inserted followed by a transitional dilator. Next, a 0.035 guidewire was advanced into the IVC with a 5-French catheter. Measurements were obtained from the right venotomy site to the proximal right atrium. In the right infraclavicular chest, a subcutaneous tunnel was created under sterile conditions and local anesthesia. 1% lidocaine with epinephrine was utilized for this. The 19 cm tip to cuff dialysis catheter was tunneled subcutaneously to the venotomy site and inserted into the SVC/RA junction through a valved peel-away sheath. Position was confirmed with fluoroscopy. Images were obtained for documentation. Blood was aspirated from the catheter followed by saline and heparin flushes. The appropriate volume and strength of heparin was instilled in each lumen. Caps were applied. The catheter was secured at the tunnel site with Gelfoam and a pursestring suture. The venotomy site was closed with subcuticular Vicryl suture. Dermabond was applied to the small right neck incision. A dry sterile dressing was applied. The catheter is ready for use. No immediate complications. IMPRESSION: Ultrasound and fluoroscopically guided right internal jugular tunneled hemodialysis catheter (19 cm tip to cuff palindrome dialysis catheter). Electronically Signed   By: Jerilynn Mages.  Shick M.D.   On: 07/06/2015 11:35    Scheduled Meds:  Scheduled Meds: . antiseptic oral rinse  7 mL Mouth Rinse q12n4p  . bisacodyl  10 mg Rectal Once  . brimonidine  1 drop Both Eyes BID  . carvedilol  6.25 mg Oral BID WC  . chlorhexidine  15 mL Mouth Rinse BID  . cyclobenzaprine  7.5 mg Oral TID  . darbepoetin  (ARANESP) injection - DIALYSIS  100 mcg Intravenous Q Fri-HD  . diclofenac sodium  2 g Topical QID  . feeding supplement (NEPRO CARB STEADY)  237 mL Oral BID BM  . fluticasone  1 spray Each Nare Daily  . gabapentin  300 mg Oral BID  . insulin aspart  0-15 Units Subcutaneous TID WC  . insulin aspart  0-5 Units Subcutaneous QHS  . insulin aspart  6 Units Subcutaneous TID WC  . insulin glargine  15 Units Subcutaneous Daily  . latanoprost  1 drop Both Eyes QHS  . multivitamin  1 tablet Oral QHS  . pantoprazole  40 mg Oral BID  . polyethylene glycol  17 g Oral Daily  . rosuvastatin  20 mg Oral Daily  . senna-docusate  1 tablet Oral BID  . sevelamer carbonate  1,600 mg Oral TID WC  . sodium chloride  3 mL Intravenous Q12H   Continuous Infusions: . sodium chloride 10 mL/hr at 07/03/15 1819  . sodium chloride 10 mL/hr at 07/03/15 2207    Time spent on care of this patient: 18 min   [place], MD 07/07/2015, 10:07 AM  LOS: 20 days   [facility] Office  [phone] Pager - Text Page per [email or web address] If 7PM-7AM, please contact night-coverage [email or web address]

## 2015-07-07 NOTE — Progress Notes (Signed)
Subjective: Interval History: has complaints neck pain.  Objective: Vital signs in last 24 hours: Temp:  [98.4 F (36.9 C)-100.1 F (37.8 C)] 98.4 F (36.9 C) (01/17 0835) Pulse Rate:  [80-103] 80 (01/17 0835) Resp:  [14-20] 18 (01/17 0835) BP: (103-149)/(51-75) 138/68 mmHg (01/17 0835) SpO2:  [94 %-99 %] 98 % (01/17 0835) Weight:  [96.8 kg (213 lb 6.5 oz)-98.8 kg (217 lb 13 oz)] 96.8 kg (213 lb 6.5 oz) (01/16 1800) Weight change:   Intake/Output from previous day: 01/16 0701 - 01/17 0700 In: 720 [P.O.:720] Out: 2350  Intake/Output this shift:    General appearance: alert, cooperative, no distress and moderately obese Resp: diminished breath sounds bilaterally Chest wall: RIJ cath Cardio: S1, S2 normal and systolic murmur: holosystolic 2/6, blowing at apex GI: obese, pos bs, liver down 5 cm Extremities: has boot on L foot  Lab Results:  Recent Labs  07/06/15 1414 07/07/15 0801  WBC 6.0 4.9  HGB 7.3* 7.1*  HCT 23.1* 21.6*  PLT 220 204   BMET:  Recent Labs  07/06/15 0606 07/06/15 1414  NA 130* 130*  K 3.6 3.5  CL 96* 96*  CO2 23 23  GLUCOSE 148* 164*  BUN 47* 48*  CREATININE 10.18* 10.97*  CALCIUM 7.5* 7.4*   No results for input(s): PTH in the last 72 hours. Iron Studies: No results for input(s): IRON, TIBC, TRANSFERRIN, FERRITIN in the last 72 hours.  Studies/Results: Ir Fluoro Guide Cv Line Right  07/06/2015  CLINICAL DATA:  Acute renal injury, hypertension, diabetes, no current permanent access. EXAM: ULTRASOUND GUIDANCE FOR VASCULAR ACCESS RIGHT INTERNAL JUGULAR PERMANENT HEMODIALYSIS CATHETER Date:  1/16/20171/16/2017 10:06 am Radiologist:  M. Daryll Brod, MD Guidance:  Ultrasound and fluoroscopic FLUOROSCOPY TIME:  24 seconds, 2 mGy MEDICATIONS AND MEDICAL HISTORY: 2 g Ancef administered within 1 hour of the procedure, 1 mg Versed, 100 mcg fentanyl ANESTHESIA/SEDATION: 15 minutes CONTRAST:  None. COMPLICATIONS: None immediate PROCEDURE: Informed  consent was obtained from the patient following explanation of the procedure, risks, benefits and alternatives. The patient understands, agrees and consents for the procedure. All questions were addressed. A time out was performed. Maximal barrier sterile technique utilized including caps, mask, sterile gowns, sterile gloves, large sterile drape, hand hygiene, and 2% chlorhexidine scrub. Under sterile conditions and local anesthesia, right internal jugular micropuncture venous access was performed with ultrasound. Images were obtained for documentation. A guide wire was inserted followed by a transitional dilator. Next, a 0.035 guidewire was advanced into the IVC with a 5-French catheter. Measurements were obtained from the right venotomy site to the proximal right atrium. In the right infraclavicular chest, a subcutaneous tunnel was created under sterile conditions and local anesthesia. 1% lidocaine with epinephrine was utilized for this. The 19 cm tip to cuff dialysis catheter was tunneled subcutaneously to the venotomy site and inserted into the SVC/RA junction through a valved peel-away sheath. Position was confirmed with fluoroscopy. Images were obtained for documentation. Blood was aspirated from the catheter followed by saline and heparin flushes. The appropriate volume and strength of heparin was instilled in each lumen. Caps were applied. The catheter was secured at the tunnel site with Gelfoam and a pursestring suture. The venotomy site was closed with subcuticular Vicryl suture. Dermabond was applied to the small right neck incision. A dry sterile dressing was applied. The catheter is ready for use. No immediate complications. IMPRESSION: Ultrasound and fluoroscopically guided right internal jugular tunneled hemodialysis catheter (19 cm tip to cuff palindrome dialysis catheter). Electronically  Signed   By: Jerilynn Mages.  Shick M.D.   On: 07/06/2015 11:35   Ir US Guide Vasc Access Right  07/06/2015  CLINICAL DATA:   Acute renal injury, hypertension, diabetes, no current permanent access. EXAM: ULTRASOUND GUIDANCE FOR VASCULAR ACCESS RIGHT INTERNAL JUGULAR PERMANENT HEMODIALYSIS CATHETER Date:  1/16/20171/16/2017 10:06 am Radiologist:  M. Daryll Brod, MD Guidance:  Ultrasound and fluoroscopic FLUOROSCOPY TIME:  24 seconds, 2 mGy MEDICATIONS AND MEDICAL HISTORY: 2 g Ancef administered within 1 hour of the procedure, 1 mg Versed, 100 mcg fentanyl ANESTHESIA/SEDATION: 15 minutes CONTRAST:  None. COMPLICATIONS: None immediate PROCEDURE: Informed consent was obtained from the patient following explanation of the procedure, risks, benefits and alternatives. The patient understands, agrees and consents for the procedure. All questions were addressed. A time out was performed. Maximal barrier sterile technique utilized including caps, mask, sterile gowns, sterile gloves, large sterile drape, hand hygiene, and 2% chlorhexidine scrub. Under sterile conditions and local anesthesia, right internal jugular micropuncture venous access was performed with ultrasound. Images were obtained for documentation. A guide wire was inserted followed by a transitional dilator. Next, a 0.035 guidewire was advanced into the IVC with a 5-French catheter. Measurements were obtained from the right venotomy site to the proximal right atrium. In the right infraclavicular chest, a subcutaneous tunnel was created under sterile conditions and local anesthesia. 1% lidocaine with epinephrine was utilized for this. The 19 cm tip to cuff dialysis catheter was tunneled subcutaneously to the venotomy site and inserted into the SVC/RA junction through a valved peel-away sheath. Position was confirmed with fluoroscopy. Images were obtained for documentation. Blood was aspirated from the catheter followed by saline and heparin flushes. The appropriate volume and strength of heparin was instilled in each lumen. Caps were applied. The catheter was secured at the tunnel  site with Gelfoam and a pursestring suture. The venotomy site was closed with subcuticular Vicryl suture. Dermabond was applied to the small right neck incision. A dry sterile dressing was applied. The catheter is ready for use. No immediate complications. IMPRESSION: Ultrasound and fluoroscopically guided right internal jugular tunneled hemodialysis catheter (19 cm tip to cuff palindrome dialysis catheter). Electronically Signed   By: Jerilynn Mages.  Shick M.D.   On: 07/06/2015 11:35    I have reviewed the patient's current medications.  Assessment/Plan: 1 AKI no urine, Hd dependent over 3 wk.  CKD2 at baseline.  Will do HD in am. Will see if can get in chair, make plans for access next week. And potential D/C next week. 2 HTn lower meds 3 Anemia esa 4 HPTH check 5 PVD s/p amp 6 DM controlled P HD, esa, map, get access soon, up in chair     LOS: 20 days   David Ford L 07/07/2015,11:03 AM

## 2015-07-07 NOTE — Progress Notes (Signed)
Orthopedic Tech Progress Note Patient Details:  David Ford May 07, 1941 [id number] Patient was giving two post op shoes one was returned . Patient ID: David Ford, male   DOB: 10-18-1940, 75 y.o.   MRN: [id number]   David Ford 07/07/2015, 4:04 PM

## 2015-07-07 NOTE — Evaluation (Signed)
Physical Therapy Re-Evaluation Patient Details Name: David Ford MRN: [id number] DOB: 1940/11/07 Today's Date: 07/07/2015   History of Present Illness  75 yo M with hx of DM, HTN Here with generalized weakness was found to have ARF most likely due to dehydration in the setting of ACEi and was found to have untreated diabetic foot ulcer.  Had ray amputation of  L foot on 07/03/15. rhabdomyolysis . ARF. SBO.   Clinical Impression  Pt presenting today s/p 1st and 2nd ray amputation. He is now NWBing through LLE and fitted in a post-op shoe. Pt  requiring Mod A x 2 for all transfers to maintain NWBing precautions. With Max VC's he was able to take minimal side steps with RW to position at side of bed. Ordered Darco shoe for increased ambulation next session as pt struggled to maintain all weight through RLE. Recommending SNF upon discharge as pt has intermittent support at home and was independent PTA.    Follow Up Recommendations SNF (Simultaneous filing. User may not have seen previous data.)    Equipment Recommendations  None recommended by PT    Recommendations for Other Services       Precautions / Restrictions Precautions Precautions: Fall Precaution Comments: NWBing on LLE Required Braces or Orthoses: Other Brace/Splint Other Brace/Splint: darco shoe Restrictions Weight Bearing Restrictions: Yes LLE Weight Bearing: Non weight bearing      Mobility  Bed Mobility Overal bed mobility: Needs Assistance Bed Mobility: Sit to Supine Rolling: Modified independent (Device/Increase time)   Supine to sit: Supervision Sit to supine: Min assist   General bed mobility comments: LE support to return to supine.   Transfers Overall transfer level: Needs assistance Equipment used: Rolling walker (2 wheeled);2 person hand held assist Transfers: Sit to/from [facility] Sit to Stand: Mod assist Stand pivot transfers: +2 physical assistance;Mod assist       General  transfer comment: Pt unable to maintain NWBing on LLE. Requiring Max VC's for sequencing lateral steps to move towards [facility] with RW. Pt repeatedly reaches for walker to pull to stand. needs redirection for safety  Ambulation/Gait             General Gait Details: due to patient unable to maintain/comply with L LE NWB pt unable to ambulate at this time  Stairs            Wheelchair Mobility    Modified Rankin (Stroke Patients Only)       Balance Overall balance assessment: Needs assistance Sitting-balance support: No upper extremity supported;Feet supported Sitting balance-Leahy Scale: Fair   Postural control: Posterior lean Standing balance support: Bilateral upper extremity supported Standing balance-Leahy Scale: Poor Standing balance comment: use of RW for support                             Pertinent Vitals/Pain Pain Assessment: No/denies pain Faces Pain Scale: Hurts little more Pain Location: right shoulder and neck Pain Intervention(s): Monitored during session    Home Living Family/patient expects to be discharged to:: Skilled nursing facility Living Arrangements: Alone Available Help at Discharge: Family;Neighbor;Available PRN/intermittently Type of Home: House Home Access: Stairs to enter Entrance Stairs-Rails: None Entrance Stairs-Number of Steps: 2-3 Home Layout: Two level;Able to live on main level with bedroom/bathroom Home Equipment: None      Prior Function Level of Independence: Independent         Comments: Living independently PTA.     Hand Dominance  Extremity/Trunk Assessment   Upper Extremity Assessment: Overall WFL for tasks assessed           Lower Extremity Assessment: Generalized weakness         Communication   Communication: No difficulties  Cognition Arousal/Alertness: Awake/alert Behavior During Therapy: Flat affect Overall Cognitive Status: Impaired/Different from baseline Area of  Impairment: Safety/judgement;Awareness       Following Commands: Follows one step commands consistently Safety/Judgement: Decreased awareness of deficits Awareness: Intellectual Problem Solving: Difficulty sequencing;Requires verbal cues General Comments: Pt unable to follow WBing precautions    General Comments      Exercises        Assessment/Plan    PT Assessment Patient needs continued PT services  PT Diagnosis Difficulty walking;Generalized weakness;Abnormality of gait   PT Problem List Decreased strength;Decreased activity tolerance;Decreased range of motion;Decreased balance;Decreased coordination;Decreased mobility  PT Treatment Interventions DME instruction;Gait training;Functional mobility training;Therapeutic activities;Therapeutic exercise;Balance training   PT Goals (Current goals can be found in the Care Plan section) Acute Rehab PT Goals Patient Stated Goal: work on walking PT Goal Formulation: With patient Time For Goal Achievement: 07/20/15 Potential to Achieve Goals: Good Additional Goals Additional Goal #1: Pt will deomnstrate wheelchair mobility forwards 178ft, abckwards, and left/right turning to navigate hallways with modified independence    Frequency Min 3X/week   Barriers to discharge Inaccessible home environment;Decreased caregiver support      Co-evaluation               End of Session Equipment Utilized During Treatment: Other (comment);Gait belt (post-op shoe) Activity Tolerance: Patient limited by fatigue Patient left: in bed;with call bell/phone within reach Nurse Communication: Mobility status         Time: [id number] PT Time Calculation (min) (ACUTE ONLY): 16 min   Charges:   PT Evaluation $PT Re-evaluation: 1 Procedure     PT G Codes:        Ara Kussmaul 2015/07/10, 4:53 PM  Ara Kussmaul, Student Physical Therapist Acute Rehab [phone]

## 2015-07-07 NOTE — Plan of Care (Signed)
Problem: Fluid Volume: Goal: Ability to maintain a balanced intake and output will improve Outcome: Progressing Very poor urinary output.

## 2015-07-07 NOTE — Progress Notes (Signed)
Orthopedic Tech Progress Note Patient Details:  David Ford 08/06/1940 [id number]  Ortho Devices Type of Ortho Device: Darco shoe Ortho Device/Splint Location: LLE Ortho Device/Splint Interventions: Ordered, Application   Braulio Bosch 07/07/2015, 3:59 PM

## 2015-07-08 ENCOUNTER — Inpatient Hospital Stay (HOSPITAL_COMMUNITY): Payer: Medicare HMO

## 2015-07-08 DIAGNOSIS — N186 End stage renal disease: Secondary | ICD-10-CM

## 2015-07-08 DIAGNOSIS — Z992 Dependence on renal dialysis: Secondary | ICD-10-CM

## 2015-07-08 LAB — CBC
HCT: 22.1 % — ABNORMAL LOW (ref 39.0–52.0)
HEMOGLOBIN: 7.2 g/dL — AB (ref 13.0–17.0)
MCH: 27.7 pg (ref 26.0–34.0)
MCHC: 32.6 g/dL (ref 30.0–36.0)
MCV: 85 fL (ref 78.0–100.0)
Platelets: 272 10*3/uL (ref 150–400)
RBC: 2.6 MIL/uL — AB (ref 4.22–5.81)
RDW: 14.3 % (ref 11.5–15.5)
WBC: 5.4 10*3/uL (ref 4.0–10.5)

## 2015-07-08 LAB — RENAL FUNCTION PANEL
ANION GAP: 13 (ref 5–15)
Albumin: 1.6 g/dL — ABNORMAL LOW (ref 3.5–5.0)
BUN: 41 mg/dL — ABNORMAL HIGH (ref 6–20)
CHLORIDE: 91 mmol/L — AB (ref 101–111)
CO2: 25 mmol/L (ref 22–32)
Calcium: 7.6 mg/dL — ABNORMAL LOW (ref 8.9–10.3)
Creatinine, Ser: 8.74 mg/dL — ABNORMAL HIGH (ref 0.61–1.24)
GFR calc non Af Amer: 5 mL/min — ABNORMAL LOW (ref >60)
GFR, EST AFRICAN AMERICAN: 6 mL/min — AB (ref >60)
Glucose, Bld: 158 mg/dL — ABNORMAL HIGH (ref 65–99)
POTASSIUM: 4 mmol/L (ref 3.5–5.1)
Phosphorus: 4.2 mg/dL (ref 2.5–4.6)
Sodium: 129 mmol/L — ABNORMAL LOW (ref 135–145)

## 2015-07-08 LAB — GLUCOSE, CAPILLARY
GLUCOSE-CAPILLARY: 232 mg/dL — AB (ref 65–99)
GLUCOSE-CAPILLARY: 224 mg/dL — AB (ref 65–99)
GLUCOSE-CAPILLARY: 206 mg/dL — AB (ref 65–99)

## 2015-07-08 MED ORDER — HEPARIN SODIUM (PORCINE) 1000 UNIT/ML DIALYSIS: 1000.0000 [IU] | INTRAMUSCULAR | Status: DC | PRN

## 2015-07-08 MED ORDER — LIDOCAINE-PRILOCAINE 2.5-2.5 % EX CREA
1.0000 "application " | TOPICAL_CREAM | CUTANEOUS | Status: DC | PRN
  Filled 2015-07-08: qty 5

## 2015-07-08 MED ORDER — GABAPENTIN 300 MG PO CAPS
300.0000 mg | ORAL_CAPSULE | Freq: Three times a day (TID) | ORAL | Status: DC
  Administered 2015-07-08 – 2015-07-15 (×19): 300 mg via ORAL
  Filled 2015-07-08 (×20): qty 1

## 2015-07-08 MED ORDER — PENTAFLUOROPROP-TETRAFLUOROETH EX AERO: 1.0000 "application " | INHALATION_SPRAY | CUTANEOUS | Status: DC | PRN

## 2015-07-08 MED ORDER — SODIUM CHLORIDE 0.9 % IV SOLN: 100.0000 mL | INTRAVENOUS | Status: DC | PRN

## 2015-07-08 MED ORDER — LIDOCAINE HCL (PF) 1 % IJ SOLN: 5.0000 mL | INTRAMUSCULAR | Status: DC | PRN

## 2015-07-08 MED ORDER — HEPARIN SODIUM (PORCINE) 1000 UNIT/ML DIALYSIS
100.0000 [IU]/kg | INTRAMUSCULAR | Status: DC | PRN
  Filled 2015-07-08: qty 10

## 2015-07-08 MED ORDER — ALTEPLASE 2 MG IJ SOLR
2.0000 mg | Freq: Once | INTRAMUSCULAR | Status: DC | PRN
  Filled 2015-07-08: qty 2

## 2015-07-08 NOTE — Progress Notes (Signed)
Nutrition Follow-up  DOCUMENTATION CODES:   Not applicable  INTERVENTION:  Continue Nepro Shake po BID, each supplement provides 425 kcal and 19 grams protein.  Encourage adequate PO intake.  NUTRITION DIAGNOSIS:   Increased nutrient needs related to wound healing as evidenced by estimated needs; ongoing  GOAL:   Patient will meet greater than or equal to 90% of their needs; progressing  MONITOR:   PO intake, Supplement acceptance, Skin, Weight trends, Labs, I & O's  REASON FOR ASSESSMENT:   Consult Assessment of nutrition requirement/status  ASSESSMENT:   75 yo M with hx of DM, HTN Here with generalized weakness was found to have ARF most likely due to dehydration in the setting of ACEi and was found to have untreated diabetic foot ulcer  Procedure (1/13): Left Foot 1st and 2nd Ray Amputation Local tissue rearrangement for wound closure 4 x 8 cm  Meal completion has been varied from 25-100%, with po intake today at 100%. Pt currently has Nepro Shake ordered with varied consumption. RD to continue with current orders. Labs and medications reviewed.   Diet Order:  Diet renal/carb modified with fluid restriction Diet-HS Snack?: Nothing; Room service appropriate?: Yes; Fluid consistency:: Thin  Skin:   (Incision L foot, incision R chest)  Last BM:  1/15  Height:   Ht Readings from Last 1 Encounters:  06/19/15 6\' 2"  (1.88 m)    Weight:   Wt Readings from Last 3 Encounters:  No data found for Wt  07/07/15 213 lbs (96.8 kg)  Ideal Body Weight:  86.4 kg  BMI:  Body mass index is 27.39 kg/(m^2).  Estimated Nutritional Needs:   Kcal:  2400-2600  Protein:  115-125 grams  Fluid:  1.2 L/day  EDUCATION NEEDS:   No education needs identified at this time  Corrin Parker, MS, RD, LDN Pager # [phone] After hours/ weekend pager # [phone]

## 2015-07-08 NOTE — Procedures (Signed)
I was present at this session.  I have reviewed the session itself and made appropriate changes.bps 130s-150s sys.  Cath flow 300-350.  Shameeka Silliman L 1/18/20179:15 AM

## 2015-07-08 NOTE — Progress Notes (Signed)
VASCULAR LAB PRELIMINARY  PRELIMINARY  PRELIMINARY  PRELIMINARY  Right  Upper Extremity Vein Map    Cephalic  Segment Diameter Depth Comment  1. Axilla mm mm Nothing in the upper arm  2. Mid upper arm mm mm   3. Above AC mm mm   4. In AC mm mm   5. Below AC 2.65 mm mm Joins the basilic vein.  See below  6. Mid forearm 1.19 mm mm   7. Wrist 0.91 mm mm    mm mm    mm mm    mm mm    Basilic  Segment Diameter Depth Comment  1. Axilla mm mm   2. Mid upper arm 3.7 mm mm   3. Above AC 3.06 mm mm   4. In AC 2.15 mm mm Joins the cephalic vein.  See above  5. Below AC mm mm   6. Mid forearm mm mm   7. Wrist mm mm    mm mm    mm mm    mm mm    Left Upper Extremity Vein Map    Cephalic  Segment Diameter Depth Comment  1. Axilla mm mm Nothing in the upper arm  2. Mid upper arm mm mm   3. Above AC mm mm   4. In AC mm mm   5. Below AC 1.96 mm mm Joins up with the brachial v.  6. Mid forearm 0.96 mm mm   7. Wrist 0.78 mm mm    mm mm    mm mm    mm mm    Basilic  Segment Diameter Depth Comment  1. Axilla mm mm   2. Mid upper arm 2.46 mm mm   3. Above AC 1.69 mm mm   4. In AC 1.69 mm mm   5. Below AC mm mm   6. Mid forearm mm mm   7. Wrist mm mm    mm mm    mm mm    mm mm     Holli Rengel, RVT 07/08/2015, 12:44 PM

## 2015-07-08 NOTE — Progress Notes (Signed)
[facility] PROGRESS NOTE  David Ford [id number] DOB: [birth year] DOA: 06/17/2015 PCP: No primary care provider on file.  Assessment/Plan: 75 y.o. male with DM, HTN admitted after a fall from his couch and being on the floor for about 3 days. His found by a friend who then called EMS.  -admitted with renal failure, also noted to have rhabdomyolysis, glucose of 454, acute renal failure with a creatinine of 4 and a potassium of 6.0. WBC count was also elevated at 16.2. He was noted to have gangrenous toes on his left foot. He underwent amputation on 1/13. He is still requiring dialysis and the renal team is still trying to determine if his renal function will recuperated. He received a temp right IJ tunneled dialysis cath on 1/16. Renal team attempting to arrange outpatient dialysis for him.    1. Diabetic foot infection. Pt underwent amputation of left 1st and 2nd ray on 1/13. He is completed IV vancomycin and Zosyn   2. Acute renal failure. Severe Rhabdomyolysis. AKI likely due to Rhabdomyolysis and sepsis from gangrenous toes -Unfortunately renal function did not recover, and Pt is started on dialysis treatments when first admitted and continues to require dialysis. Nephrology team managing 3. Neck pain possibly sprain. X ray: no acute fracrtures -cont Voltaren gel, Flexeril Q 8 hrs and Narcotics PRN if pain is significant 4. Episode of upper GI bleed. Suspected be secondary to NG tube insertion and gastritis - was started on BID Protonix which I have decreased to daily 5. AOCD. Renal. transfused 2 U PRBC. cont to follow Hb and transfuse as needed for Hb < 7 with HD 6. Partial small bowel obstructionResolved with conservative management 7. Atrial fibrillation with RVR Occurred on hemodialysis on 12/1. 2-D echo revealed EF of 50-55% and grade 1 diastolic dysfunction. TSH was normal at 0.653 on 12/31.  -No further episodes noted since then- will hold off on anticoagulation  8. DM2  ha1c-16.2 cont Lantus and Insulin. Titrate as needed 9. Essential HTN. coreg and Hydralazine   Code Status: full Family Communication: d/w patient, RN (indicate person spoken with, relationship, and if by phone, the number) Disposition Plan: pend HD arrangement    Consultants:  Nephrology  IR  Ortho    Procedures:  S/p left 1st and 2nd ray on 1/13   IJ tunneled dialysis cath on 1/16  Antibiotics:  vanc zosyn  (indicate start date, and stop date if known)  HPI/Subjective: Alert. No distress. Denies SOB  Objective: Filed Vitals:   07/08/15 1130 07/08/15 1136  BP: 168/66 143/61  Pulse: 90 91  Temp:    Resp:  17    Intake/Output Summary (Last 24 hours) at 07/08/15 1421 Last data filed at 07/08/15 1210  Gross per 24 hour  Intake    940 ml  Output   2354 ml  Net  -1414 ml   Filed Weights   07/06/15 1350 07/06/15 1800 07/07/15 2036  Weight: 98.8 kg (217 lb 13 oz) 96.8 kg (213 lb 6.5 oz) 96.8 kg (213 lb 6.5 oz)    Exam:   General:  alert  Cardiovascular:  S1,s2 rrr  Respiratory: few crackles LL  Abdomen: soft, nt,nd   Musculoskeletal: no leg edema    Data Reviewed: Basic Metabolic Panel:  Recent Labs Lab 07/05/15 0433 07/06/15 0606 07/06/15 1414 07/07/15 0801 07/08/15 0700  NA 131* 130* 130* 133* 129*  K 3.6 3.6 3.5 3.6 4.0  CL 95* 96* 96* 96* 91*  CO2 22 23 23  23  25  GLUCOSE 179* 148* 164* 109* 158*  BUN 36* 47* 48* 28* 41*  CREATININE 8.76* 10.18* 10.97* 7.17* 8.74*  CALCIUM 8.1* 7.5* 7.4* 7.1* 7.6*  PHOS 6.9* 5.8* 6.0* 4.0 4.2   Liver Function Tests:  Recent Labs Lab 07/05/15 0433 07/06/15 0606 07/06/15 1414 07/07/15 0801 07/08/15 0700  ALBUMIN 1.7* 1.5* 1.6* 1.6* 1.6*   No results for input(s): LIPASE, AMYLASE in the last 168 hours. No results for input(s): AMMONIA in the last 168 hours. CBC:  Recent Labs Lab 07/05/15 0433 07/06/15 0606 07/06/15 1037 07/06/15 1414 07/07/15 0801 07/08/15 0700  WBC 7.3 5.9  --  6.0  4.9 5.4  HGB 8.4* 6.8* 7.4* 7.3* 7.1* 7.2*  HCT 25.0* 19.9* 22.6* 23.1* 21.6* 22.1*  MCV 84.2 83.3  --  84.3 84.4 85.0  PLT 206 198  --  220 204 272   Cardiac Enzymes: No results for input(s): CKTOTAL, CKMB, CKMBINDEX, TROPONINI in the last 168 hours. BNP (last 3 results) No results for input(s): BNP in the last 8760 hours.  ProBNP (last 3 results) No results for input(s): PROBNP in the last 8760 hours.  CBG:  Recent Labs Lab 07/07/15 0805 07/07/15 1153 07/07/15 1722 07/07/15 2032 07/08/15 1244  GLUCAP 119* 160* 114* 176* 232*    Recent Results (from the past 240 hour(s))  Surgical pcr screen     Status: None   Collection Time: 07/03/15  2:00 PM  Result Value Ref Range Status   MRSA, PCR NEGATIVE NEGATIVE Final   Staphylococcus aureus NEGATIVE NEGATIVE Final    Comment:        The Xpert SA Assay (FDA approved for NASAL specimens in patients over 39 years of age), is one component of a comprehensive surveillance program.  Test performance has been validated by [facility] for patients greater than or equal to 28 year old. It is not intended to diagnose infection nor to guide or monitor treatment.   C difficile quick scan w PCR reflex     Status: None   Collection Time: 07/05/15  9:47 AM  Result Value Ref Range Status   C Diff antigen NEGATIVE NEGATIVE Final   C Diff toxin NEGATIVE NEGATIVE Final   C Diff interpretation Negative for toxigenic C. difficile  Final     Studies: Dg Cervical Spine 2 Or 3 Views  07/07/2015  CLINICAL DATA:  Neck stiffness on waking up this morning. Pain. History of prior fall. Initial encounter. EXAM: CERVICAL SPINE - 2-3 VIEW COMPARISON:  None. FINDINGS: Straightening of the normal cervical lordosis is identified. There is multilevel loss of disc space height appearing worst a C5-6. Multilevel facet degenerative disease is also identified. Prevertebral soft tissues appear normal. Lung apices are clear. IMPRESSION: No acute abnormality.  Multilevel spondylosis. Electronically Signed   By: Inge Rise M.D.   On: 07/07/2015 14:40    Scheduled Meds: . antiseptic oral rinse  7 mL Mouth Rinse q12n4p  . bisacodyl  10 mg Rectal Once  . brimonidine  1 drop Both Eyes BID  . carvedilol  3.125 mg Oral BID WC  . chlorhexidine  15 mL Mouth Rinse BID  . cyclobenzaprine  7.5 mg Oral TID  . [START ON 07/10/2015] darbepoetin (ARANESP) injection - DIALYSIS  200 mcg Intravenous Q Fri-HD  . diclofenac sodium  2 g Topical QID  . feeding supplement (NEPRO CARB STEADY)  237 mL Oral BID BM  . fluticasone  1 spray Each Nare Daily  . gabapentin  300 mg  Oral BID  . insulin aspart  0-15 Units Subcutaneous TID WC  . insulin aspart  0-5 Units Subcutaneous QHS  . insulin aspart  6 Units Subcutaneous TID WC  . insulin glargine  15 Units Subcutaneous Daily  . latanoprost  1 drop Both Eyes QHS  . multivitamin  1 tablet Oral QHS  . pantoprazole  40 mg Oral Daily  . polyethylene glycol  17 g Oral Daily  . rosuvastatin  20 mg Oral Daily  . senna-docusate  1 tablet Oral BID  . sevelamer carbonate  1,600 mg Oral TID WC  . sodium chloride  3 mL Intravenous Q12H   Continuous Infusions: . sodium chloride 10 mL/hr at 07/03/15 1819  . sodium chloride 10 mL/hr at 07/03/15 2207    Principal Problem:   Diabetic foot infection ([place]) Active Problems:   Hypertension   Leukocytosis   Elevated troponin   Hyponatremia   Hypoalbuminemia   Hyperkalemia   Rhabdomyolysis   Metabolic acidosis   Acute renal failure (ARF) ([place])   DM type 2 causing renal disease ([place])   Dehydration   Debility   Ileus ([facility])   SBO (small bowel obstruction) ([place])   Gastrointestinal hemorrhage with hematemesis   Atrial fibrillation ([place])    Time spent: >35 minutes     Kinnie Feil  [facility] Pager [phone]. If 7PM-7AM, please contact night-coverage at [email or web address], password [facility] 07/08/2015, 2:21 PM  LOS: 21 days

## 2015-07-08 NOTE — Progress Notes (Signed)
Subjective: Interval History: has no complaint.  Objective: Vital signs in last 24 hours: Temp:  [98.9 F (37.2 C)-99.2 F (37.3 C)] 99 F (37.2 C) (01/18 0734) Pulse Rate:  [66-78] 66 (01/18 0900) Resp:  [12-17] 12 (01/18 0734) BP: (131-163)/(56-73) 163/73 mmHg (01/18 0900) SpO2:  [96 %-98 %] 96 % (01/18 0734) Weight:  [96.8 kg (213 lb 6.5 oz)] 96.8 kg (213 lb 6.5 oz) (01/17 2036) Weight change: -2 kg (-4 lb 6.6 oz)  Intake/Output from previous day: 01/17 0701 - 01/18 0700 In: 1522 [P.O.:1522] Out: 200 [Urine:200] Intake/Output this shift:    General appearance: alert, cooperative and no distress Resp: clear to auscultation bilaterally Chest wall: RIJ cath PC Cardio: S1, S2 normal and systolic murmur: holosystolic 2/6, blowing at apex GI: obese, pos bs,liver down 6 cm Extremities: Unna boot on L , post amp of rays  Lab Results:  Recent Labs  07/07/15 0801 07/08/15 0700  WBC 4.9 5.4  HGB 7.1* 7.2*  HCT 21.6* 22.1*  PLT 204 272   BMET:  Recent Labs  07/07/15 0801 07/08/15 0700  NA 133* 129*  K 3.6 4.0  CL 96* 91*  CO2 23 25  GLUCOSE 109* 158*  BUN 28* 41*  CREATININE 7.17* 8.74*  CALCIUM 7.1* 7.6*   No results for input(s): PTH in the last 72 hours. Iron Studies: No results for input(s): IRON, TIBC, TRANSFERRIN, FERRITIN in the last 72 hours.  Studies/Results: Dg Cervical Spine 2 Or 3 Views  07/07/2015  CLINICAL DATA:  Neck stiffness on waking up this morning. Pain. History of prior fall. Initial encounter. EXAM: CERVICAL SPINE - 2-3 VIEW COMPARISON:  None. FINDINGS: Straightening of the normal cervical lordosis is identified. There is multilevel loss of disc space height appearing worst a C5-6. Multilevel facet degenerative disease is also identified. Prevertebral soft tissues appear normal. Lung apices are clear. IMPRESSION: No acute abnormality. Multilevel spondylosis. Electronically Signed   By: Inge Rise M.D.   On: 07/07/2015 14:40   Ir Fluoro  Guide Cv Line Right  07/06/2015  CLINICAL DATA:  Acute renal injury, hypertension, diabetes, no current permanent access. EXAM: ULTRASOUND GUIDANCE FOR VASCULAR ACCESS RIGHT INTERNAL JUGULAR PERMANENT HEMODIALYSIS CATHETER Date:  1/16/20171/16/2017 10:06 am Radiologist:  M. Daryll Brod, MD Guidance:  Ultrasound and fluoroscopic FLUOROSCOPY TIME:  24 seconds, 2 mGy MEDICATIONS AND MEDICAL HISTORY: 2 g Ancef administered within 1 hour of the procedure, 1 mg Versed, 100 mcg fentanyl ANESTHESIA/SEDATION: 15 minutes CONTRAST:  None. COMPLICATIONS: None immediate PROCEDURE: Informed consent was obtained from the patient following explanation of the procedure, risks, benefits and alternatives. The patient understands, agrees and consents for the procedure. All questions were addressed. A time out was performed. Maximal barrier sterile technique utilized including caps, mask, sterile gowns, sterile gloves, large sterile drape, hand hygiene, and 2% chlorhexidine scrub. Under sterile conditions and local anesthesia, right internal jugular micropuncture venous access was performed with ultrasound. Images were obtained for documentation. A guide wire was inserted followed by a transitional dilator. Next, a 0.035 guidewire was advanced into the IVC with a 5-French catheter. Measurements were obtained from the right venotomy site to the proximal right atrium. In the right infraclavicular chest, a subcutaneous tunnel was created under sterile conditions and local anesthesia. 1% lidocaine with epinephrine was utilized for this. The 19 cm tip to cuff dialysis catheter was tunneled subcutaneously to the venotomy site and inserted into the SVC/RA junction through a valved peel-away sheath. Position was confirmed with fluoroscopy. Images were obtained  for documentation. Blood was aspirated from the catheter followed by saline and heparin flushes. The appropriate volume and strength of heparin was instilled in each lumen. Caps were  applied. The catheter was secured at the tunnel site with Gelfoam and a pursestring suture. The venotomy site was closed with subcuticular Vicryl suture. Dermabond was applied to the small right neck incision. A dry sterile dressing was applied. The catheter is ready for use. No immediate complications. IMPRESSION: Ultrasound and fluoroscopically guided right internal jugular tunneled hemodialysis catheter (19 cm tip to cuff palindrome dialysis catheter). Electronically Signed   By: Jerilynn Mages.  Shick M.D.   On: 07/06/2015 11:35   Ir US Guide Vasc Access Right  07/06/2015  CLINICAL DATA:  Acute renal injury, hypertension, diabetes, no current permanent access. EXAM: ULTRASOUND GUIDANCE FOR VASCULAR ACCESS RIGHT INTERNAL JUGULAR PERMANENT HEMODIALYSIS CATHETER Date:  1/16/20171/16/2017 10:06 am Radiologist:  M. Daryll Brod, MD Guidance:  Ultrasound and fluoroscopic FLUOROSCOPY TIME:  24 seconds, 2 mGy MEDICATIONS AND MEDICAL HISTORY: 2 g Ancef administered within 1 hour of the procedure, 1 mg Versed, 100 mcg fentanyl ANESTHESIA/SEDATION: 15 minutes CONTRAST:  None. COMPLICATIONS: None immediate PROCEDURE: Informed consent was obtained from the patient following explanation of the procedure, risks, benefits and alternatives. The patient understands, agrees and consents for the procedure. All questions were addressed. A time out was performed. Maximal barrier sterile technique utilized including caps, mask, sterile gowns, sterile gloves, large sterile drape, hand hygiene, and 2% chlorhexidine scrub. Under sterile conditions and local anesthesia, right internal jugular micropuncture venous access was performed with ultrasound. Images were obtained for documentation. A guide wire was inserted followed by a transitional dilator. Next, a 0.035 guidewire was advanced into the IVC with a 5-French catheter. Measurements were obtained from the right venotomy site to the proximal right atrium. In the right infraclavicular chest, a  subcutaneous tunnel was created under sterile conditions and local anesthesia. 1% lidocaine with epinephrine was utilized for this. The 19 cm tip to cuff dialysis catheter was tunneled subcutaneously to the venotomy site and inserted into the SVC/RA junction through a valved peel-away sheath. Position was confirmed with fluoroscopy. Images were obtained for documentation. Blood was aspirated from the catheter followed by saline and heparin flushes. The appropriate volume and strength of heparin was instilled in each lumen. Caps were applied. The catheter was secured at the tunnel site with Gelfoam and a pursestring suture. The venotomy site was closed with subcuticular Vicryl suture. Dermabond was applied to the small right neck incision. A dry sterile dressing was applied. The catheter is ready for use. No immediate complications. IMPRESSION: Ultrasound and fluoroscopically guided right internal jugular tunneled hemodialysis catheter (19 cm tip to cuff palindrome dialysis catheter). Electronically Signed   By: Jerilynn Mages.  Shick M.D.   On: 07/06/2015 11:35    I have reviewed the patient's current medications.  Assessment/Plan: 1 AKI/CKD oliguric ATN. For HD.  Will get perm access early next week.  2 Anemia esa/Fe 3 PVD post amp stable, 4 Debill mobilize 5 DM controlled 6 HPTH vit D 7 Obesity P HD, mobilize, access    LOS: 21 days   Shalon Salado L 07/08/2015,9:21 AM

## 2015-07-08 NOTE — Consult Note (Signed)
Patient name: David Ford MRN: [id number] DOB: [birth year] Sex: male  REASON FOR CONSULT: permanent dialysis access  HPI: David Ford is a 75 y.o. male, who presents for evaluation for permanent dialysis access. He is currently on dialysis via a right IJ tunneled dialysis catheter. The patient is right handed. He has never had access procedures before. He does not have a pacemaker.   The patient presented to the [facility] ED on 06/17/15 after sustaining a fall onto the floor from his couch. The patient was on the floor for 3 days. He was found by a friend. On admission, he was noted to have rhabdomyolysis and in acute renal failure and was started on dialysis. The patient has no knowledge of prior kidney disease. He also was noted to have a diabetic foot infection requiring 1st and 2nd ray amputations on 07/03/15 by Dr. Sharol Given. The patient denied any injury to his foot. His hospital course has been complicated by partial small bowel obstruction, a remote episode of atrial fibrillation with RVR on 12/1, mild elevation in troponin, and upper GI bleed suspected to be secondary to NG tube insertion and gastritis.   The patient has a past medical history of poorly controlled diabetes, hypertension and hx of prostate cancer.   Past Medical History  Diagnosis Date  . Diabetes mellitus without complication ([place])   . Hypertension   . DKA (diabetic ketoacidoses) ([employer]) 06/18/2015  . AKI (acute kidney injury) ([place]) 06/18/2015  . Cancer ([facility])     hx of prostate cancer    Family History  Problem Relation Age of Onset  . Diabetes Mother   . Diabetes Sister   . Diabetes Brother     SOCIAL HISTORY: Social History   Social History  . Marital Status: Married    Spouse Name: N/A  . Number of Children: N/A  . Years of Education: N/A   Occupational History  . Not on file.   Social History Main Topics  . Smoking status: Never Smoker   . Smokeless tobacco: Never Used  . Alcohol Use: Yes    Comment: once a week  . Drug Use: No  . Sexual Activity: Not on file   Other Topics Concern  . Not on file   Social History Narrative    No Known Allergies  Current Facility-Administered Medications  Medication Dose Route Frequency Provider Last Rate Last Dose  . 0.9 %  sodium chloride infusion   Intravenous Continuous Duane Boston, MD 10 mL/hr at 07/03/15 1819    . 0.9 %  sodium chloride infusion   Intravenous Continuous Newt Minion, MD 10 mL/hr at 07/03/15 2207    . acetaminophen (TYLENOL) tablet 650 mg  650 mg Oral Q6H PRN Newt Minion, MD       Or  . acetaminophen (TYLENOL) suppository 650 mg  650 mg Rectal Q6H PRN Newt Minion, MD      . antiseptic oral rinse (CPC / CETYLPYRIDINIUM CHLORIDE 0.05%) solution 7 mL  7 mL Mouth Rinse q12n4p Janece Canterbury, MD   7 mL at 07/07/15 1752  . bisacodyl (DULCOLAX) suppository 10 mg  10 mg Rectal Once Debbe Odea, MD   10 mg at 07/01/15 1500  . brimonidine (ALPHAGAN) 0.2 % ophthalmic solution 1 drop  1 drop Both Eyes BID Janece Canterbury, MD   1 drop at 07/08/15 1329  . carvedilol (COREG) tablet 3.125 mg  3.125 mg Oral BID WC Mauricia Area, MD   3.125 mg at  07/08/15 1328  . chlorhexidine (PERIDEX) 0.12 % solution 15 mL  15 mL Mouth Rinse BID Janece Canterbury, MD   15 mL at 07/08/15 1327  . cyclobenzaprine (FLEXERIL) tablet 7.5 mg  7.5 mg Oral TID Debbe Odea, MD   7.5 mg at 07/08/15 1326  . [START ON 07/10/2015] Darbepoetin Alfa (ARANESP) injection 200 mcg  200 mcg Intravenous Q Fri-HD Mauricia Area, MD      . diclofenac sodium (VOLTAREN) 1 % transdermal gel 2 g  2 g Topical QID Debbe Odea, MD   2 g at 07/08/15 1330  . feeding supplement (NEPRO CARB STEADY) liquid 237 mL  237 mL Oral BID BM Dale Kimball, RD   237 mL at 07/08/15 1400  . fluticasone (FLONASE) 50 MCG/ACT nasal spray 1 spray  1 spray Each Nare Daily Lavina Hamman, MD   1 spray at 07/08/15 1329  . gabapentin (NEURONTIN) capsule 300 mg  300 mg Oral TID Kinnie Feil, MD      . guaiFENesin (ROBITUSSIN) 100 MG/5ML solution 100 mg  5 mL Oral Q4H PRN Lavina Hamman, MD      . HYDROcodone-acetaminophen (HYCET) 7.5-325 mg/15 ml solution 10 mL  10 mL Oral Q4H PRN Debbe Odea, MD      . HYDROmorphone (DILAUDID) injection 1 mg  1 mg Intravenous Q2H PRN Newt Minion, MD   1 mg at 07/08/15 0644  . insulin aspart (novoLOG) injection 0-15 Units  0-15 Units Subcutaneous TID WC Lavina Hamman, MD   5 Units at 07/08/15 1331  . insulin aspart (novoLOG) injection 0-5 Units  0-5 Units Subcutaneous QHS Lavina Hamman, MD   2 Units at 06/27/15 2238  . insulin aspart (novoLOG) injection 6 Units  6 Units Subcutaneous TID WC Lavina Hamman, MD   6 Units at 07/08/15 1330  . insulin glargine (LANTUS) injection 15 Units  15 Units Subcutaneous Daily Lavina Hamman, MD   15 Units at 07/08/15 1327  . latanoprost (XALATAN) 0.005 % ophthalmic solution 1 drop  1 drop Both Eyes QHS Janece Canterbury, MD   1 drop at 07/07/15 2133  . metoCLOPramide (REGLAN) tablet 5-10 mg  5-10 mg Oral Q8H PRN Newt Minion, MD       Or  . metoCLOPramide (REGLAN) injection 5-10 mg  5-10 mg Intravenous Q8H PRN Newt Minion, MD      . multivitamin (RENA-VIT) tablet 1 tablet  1 tablet Oral QHS Mauricia Area, MD   1 tablet at 07/07/15 2132  . ondansetron (ZOFRAN) tablet 4 mg  4 mg Oral Q6H PRN Newt Minion, MD       Or  . ondansetron [facility]) injection 4 mg  4 mg Intravenous Q6H PRN Newt Minion, MD      . pantoprazole (PROTONIX) EC tablet 40 mg  40 mg Oral Daily Debbe Odea, MD   40 mg at 07/08/15 1328  . polyethylene glycol (MIRALAX / GLYCOLAX) packet 17 g  17 g Oral Daily Lavina Hamman, MD   17 g at 07/08/15 1328  . rosuvastatin (CRESTOR) tablet 20 mg  20 mg Oral Daily Lavina Hamman, MD   20 mg at 07/08/15 1326  . senna-docusate (Senokot-S) tablet 1 tablet  1 tablet Oral BID Lavina Hamman, MD   1 tablet at 07/08/15 1326  . sevelamer carbonate (RENVELA) tablet 1,600 mg  1,600 mg Oral TID WC Dwana Melena, MD   1,600 mg at 07/08/15  1327  . sodium chloride 0.9 % injection 3 mL  3 mL Intravenous Q12H Toy Baker, MD   3 mL at 07/08/15 1330  . traZODone (DESYREL) tablet 150-300 mg  150-300 mg Oral QHS PRN Lavina Hamman, MD   150 mg at 07/07/15 0016    REVIEW OF SYSTEMS:  [X]  denotes positive finding, [ ]  denotes negative finding Cardiac  Comments:  Chest pain or chest pressure:    Shortness of breath upon exertion:    Short of breath when lying flat:    Irregular heart rhythm:        Vascular    Pain in calf, thigh, or hip brought on by ambulation:    Pain in feet at night that wakes you up from your sleep:     Blood clot in your veins:    Leg swelling:         Pulmonary    Oxygen at home:    Productive cough:     Wheezing:         Neurologic    Sudden weakness in arms or legs:     Sudden numbness in arms or legs:     Sudden onset of difficulty speaking or slurred speech:    Temporary loss of vision in one eye:     Problems with dizziness:         Gastrointestinal    Blood in stool:     Vomited blood:         Genitourinary    Burning when urinating:     Blood in urine:        Psychiatric    Major depression:         Hematologic    Bleeding problems:    Problems with blood clotting too easily:        Skin    Rashes or ulcers:        Constitutional    Fever or chills:      PHYSICAL EXAM: Filed Vitals:   07/08/15 1030 07/08/15 1100 07/08/15 1130 07/08/15 1136  BP: 157/64 159/53 168/66 143/61  Pulse: 78 82 90 91  Temp:      TempSrc:    Oral  Resp:    17  Height:      Weight:      SpO2:    97%    GENERAL: The patient is a well-nourished male, in no acute distress. The vital signs are documented above. CARDIAC: There is a regular rate and rhythm.  VASCULAR: 2+ radial, ulnar and brachial pulses bilaterally. 2-3+ popliteal pulses bilaterally. Left popliteal is more prominent. Left foot is bandaged. Right foot with 2+ dorsalis pedis and posterior  tibial pulses.  PULMONARY: There is good air exchange bilaterally without wheezing or rales. ABDOMEN: Soft and non-tender.  MUSCULOSKELETAL: There are no major deformities or cyanosis. NEUROLOGIC: No focal weakness or paresthesias are detected. PSYCHIATRIC: The patient has a normal affect. NECK: no nuchal rigidity, no cervical LAD ENT: hearing grossly intact, oropharynx without erythema or exudate, nares without any drainage HEAD: [place]/AT LYMPH: mild L femoral LAD, no cervical LAD   DATA:  Vein mapping pending.   Laboratory: CBC:    Component Value Date/Time   WBC 5.4 07/08/2015 0700   RBC 2.60* 07/08/2015 0700   HGB 7.2* 07/08/2015 0700   HCT 22.1* 07/08/2015 0700   PLT 272 07/08/2015 0700   MCV 85.0 07/08/2015 0700   MCH 27.7 07/08/2015 0700   MCHC 32.6 07/08/2015 0700  RDW 14.3 07/08/2015 0700   LYMPHSABS 1.9 06/25/2015 0248   MONOABS 1.4* 06/25/2015 0248   EOSABS 0.2 06/25/2015 0248   BASOSABS 0.2* 06/25/2015 0248    BMP:    Component Value Date/Time   NA 129* 07/08/2015 0700   K 4.0 07/08/2015 0700   CL 91* 07/08/2015 0700   CO2 25 07/08/2015 0700   GLUCOSE 158* 07/08/2015 0700   BUN 41* 07/08/2015 0700   CREATININE 8.74* 07/08/2015 0700   CALCIUM 7.6* 07/08/2015 0700   GFRNONAA 5* 07/08/2015 0700   GFRAA 6* 07/08/2015 0700    Coagulation: Lab Results  Component Value Date   INR 1.30 07/06/2015   INR 1.29 06/29/2015   No results found for: PTT  Lipids: No results found for: CHOL, TRIG, HDL, CHOLHDL, VLDL, LDLCALC, LDLDIRECT   Radiology: Dg Cervical Spine 2 Or 3 Views  07/07/2015  CLINICAL DATA:  Neck stiffness on waking up this morning. Pain. History of prior fall. Initial encounter. EXAM: CERVICAL SPINE - 2-3 VIEW COMPARISON:  None. FINDINGS: Straightening of the normal cervical lordosis is identified. There is multilevel loss of disc space height appearing worst a C5-6. Multilevel facet degenerative disease is also identified. Prevertebral soft  tissues appear normal. Lung apices are clear. IMPRESSION: No acute abnormality. Multilevel spondylosis. Electronically Signed   By: Inge Rise M.D.   On: 07/07/2015 14:40     MEDICAL ISSUES: 1.  ESRD on HD 2.  DM with complications included ESRD 3.  Prominent popliteal pulses - r/o popliteal aneurysm 4.  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 He is right handed. Vein mapping is pending. Plan for left arm access early next week as asked by nephrology. Will make access recommendations once vein mapping is back. Given prominent popliteal pulses on exam, will order popliteal artery duplex to evaluate for popliteal aneurysm.   Virgina Jock, PA-C [facility] [phone]  Addendum  I have independently interviewed and examined the patient, and I agree with the physician assistant's findings.  On vein mapping, no fistula options.  Given patient's history of gangrene, avoiding a prosthetic AVG would be best.  Recheck both arms for brachial vein transposition options.  Recommendations to follow those studies.  Adele Barthel, MD [facility] [place] Office: [phone] Pager: [phone]  07/08/2015, 5:12 PM

## 2015-07-09 ENCOUNTER — Inpatient Hospital Stay (HOSPITAL_COMMUNITY): Payer: Medicare HMO

## 2015-07-09 DIAGNOSIS — R0989 Other specified symptoms and signs involving the circulatory and respiratory systems: Secondary | ICD-10-CM

## 2015-07-09 DIAGNOSIS — Z992 Dependence on renal dialysis: Secondary | ICD-10-CM

## 2015-07-09 DIAGNOSIS — N186 End stage renal disease: Secondary | ICD-10-CM

## 2015-07-09 LAB — GLUCOSE, CAPILLARY
GLUCOSE-CAPILLARY: 156 mg/dL — AB (ref 65–99)
GLUCOSE-CAPILLARY: 201 mg/dL — AB (ref 65–99)
GLUCOSE-CAPILLARY: 119 mg/dL — AB (ref 65–99)
Glucose-Capillary: 120 mg/dL — ABNORMAL HIGH (ref 65–99)

## 2015-07-09 LAB — PTH, INTACT AND CALCIUM
CALCIUM TOTAL (PTH): 7.5 mg/dL — AB (ref 8.6–10.2)
PTH: 197 pg/mL — AB (ref 15–65)

## 2015-07-09 MED ORDER — BISACODYL 10 MG RE SUPP
10.0000 mg | Freq: Once | RECTAL | Status: AC
  Administered 2015-07-09: 10 mg via RECTAL
  Filled 2015-07-09: qty 1

## 2015-07-09 NOTE — Progress Notes (Signed)
Upper Extremity Vein Map- brachial veins    Right brachial: high bifurcation of radial and ulnar - proximal upper arm. Segment Diameter Depth Comment  1. Axilla 8.61mm 23.13mm   2. Mid upper arm (radial) 4.61mm 25.3mm branch  3. Above AC 5.29mm 24.58mm tortuous  4. In [facility] 4.61mm 17.69mm    mm mm    mm mm    mm mm    mm mm    mm mm    mm mm     Left Brachial Segment Diameter Depth Comment  1. Axilla 50mm 10.23mm   2. Mid upper arm 3.100mm 16.26mm branch  3. Above AC 4.90mm 10.60mm   4. In [facility] 3.63mm 10.84mm   5. Below AC mm mm   6. Mid forearm mm mm   7. Wrist mm mm    mm mm    mm mm    mm mm     Landry Mellow, RDMS, RVT 07/09/2015

## 2015-07-09 NOTE — Progress Notes (Signed)
Subjective: Interval History: has no complaint .  Objective: Vital signs in last 24 hours: Temp:  [98.4 F (36.9 C)-100.2 F (37.9 C)] 99.1 F (37.3 C) (01/19 0809) Pulse Rate:  [77-98] 81 (01/19 0809) Resp:  [17-20] 18 (01/19 0809) BP: (120-168)/(51-83) 139/83 mmHg (01/19 0809) SpO2:  [96 %-98 %] 97 % (01/19 0809) Weight:  [102.6 kg (226 lb 3.1 oz)] 102.6 kg (226 lb 3.1 oz) (01/19 0500) Weight change: 5.8 kg (12 lb 12.6 oz)  Intake/Output from previous day: 01/18 0701 - 01/19 0700 In: 360 [P.O.:360] Out: 2254  Intake/Output this shift: Total I/O In: 750 [P.O.:750] Out: -   General appearance: alert, cooperative and no distress Resp: diminished breath sounds bilaterally Chest wall: RIJ cath Cardio: S1, S2 normal and systolic murmur: holosystolic 2/6, blowing at apex GI: soft, non-tender; bowel sounds normal; no masses,  no organomegaly Extremities: edema 1+, and dressing L foot  Lab Results:  Recent Labs  07/07/15 0801 07/08/15 0700  WBC 4.9 5.4  HGB 7.1* 7.2*  HCT 21.6* 22.1*  PLT 204 272   BMET:  Recent Labs  07/07/15 0801 07/08/15 0700 07/08/15 0808  NA 133* 129*  --   K 3.6 4.0  --   CL 96* 91*  --   CO2 23 25  --   GLUCOSE 109* 158*  --   BUN 28* 41*  --   CREATININE 7.17* 8.74*  --   CALCIUM 7.1* 7.6* 7.5*    Recent Labs  07/08/15 0808  PTH 197*  Comment   Iron Studies: No results for input(s): IRON, TIBC, TRANSFERRIN, FERRITIN in the last 72 hours.  Studies/Results: Dg Cervical Spine 2 Or 3 Views  07/07/2015  CLINICAL DATA:  Neck stiffness on waking up this morning. Pain. History of prior fall. Initial encounter. EXAM: CERVICAL SPINE - 2-3 VIEW COMPARISON:  None. FINDINGS: Straightening of the normal cervical lordosis is identified. There is multilevel loss of disc space height appearing worst a C5-6. Multilevel facet degenerative disease is also identified. Prevertebral soft tissues appear normal. Lung apices are clear. IMPRESSION: No acute  abnormality. Multilevel spondylosis. Electronically Signed   By: Inge Rise M.D.   On: 07/07/2015 14:40    I have reviewed the patient's current medications.  Assessment/Plan: 1 AKI/CKD nonresolving. Oliguric. Will d/c foley.  Will get perm access early next week and CLIP 2 PVD per Ortho 3 DM controlled 4 Anemia esa,Fe 5 HPTH vit D 6 Obesity 7 Debill P HD, esa, bp meds, PT    LOS: 22 days   Jazz Biddy L 07/09/2015,9:50 AM

## 2015-07-09 NOTE — Progress Notes (Signed)
*  PRELIMINARY RESULTS* Vascular Ultrasound Limited lower extremity arterial duplex has been completed.  Preliminary findings: no evidence of popliteal artery aneurysm bilaterally.  Landry Mellow, RDMS, RVT  07/09/2015, 10:21 AM

## 2015-07-09 NOTE — Progress Notes (Signed)
[facility] PROGRESS NOTE  David Ford [id number] DOB: [birth year] DOA: 06/17/2015 PCP: No primary care provider on file.  Assessment/Plan: 75 y.o. male with DM, HTN admitted after a fall from his couch and being on the floor for about 3 days. His found by a friend who then called EMS.  -admitted with renal failure, also noted to have rhabdomyolysis, glucose of 454, acute renal failure with a creatinine of 4 and a potassium of 6.0. WBC count was also elevated at 16.2. He was noted to have gangrenous toes on his left foot. He underwent amputation on 1/13. He is still requiring dialysis and the renal team is still trying to determine if his renal function will recuperated. He received a temp right IJ tunneled dialysis cath on 1/16. Renal team attempting to arrange outpatient dialysis for him.    1. Diabetic foot infection. Pt underwent amputation of left 1st and 2nd ray on 1/13. He is completed IV vancomycin and Zosyn   2. Acute renal failure. Severe Rhabdomyolysis. AKI likely due to Rhabdomyolysis and sepsis from gangrenous toes -Unfortunately renal function did not recover, and Pt is started on dialysis treatments when first admitted and continues to require dialysis. Nephrology team managing. Scheduled for access placement next week per [facility] 3. Neck pain possibly sprain. X ray: no acute fracrtures -cont Voltaren gel, Flexeril Q 8 hrs and Narcotics PRN if pain is significant 4. Episode of upper GI bleed. Suspected be secondary to NG tube insertion and gastritis - was started on BID Protonix which I have decreased to daily 5. AOCD. Renal. transfused 2 U PRBC. cont to follow Hb and transfuse as needed for Hb < 7 with HD 6. Partial small bowel obstruction Resolved with conservative management 7. Atrial fibrillation with RVR Occurred on hemodialysis on 12/1. 2-D echo revealed EF of 50-55% and grade 1 diastolic dysfunction. TSH was normal at 0.653 on 12/31.  -No further episodes noted  since then- will hold off on anticoagulation  8. DM2 ha1c-16.2 cont Lantus and Insulin. Titrate as needed 9. Essential HTN. coreg and Hydralazine   Code Status: full Family Communication: d/w patient, RN (indicate person spoken with, relationship, and if by phone, the number) Disposition Plan: pend HD arrangement    Consultants:  Nephrology  IR  Ortho    Procedures:  S/p left 1st and 2nd ray on 1/13   IJ tunneled dialysis cath on 1/16  Antibiotics:  vanc zosyn  (indicate start date, and stop date if known)  HPI/Subjective: Alert. No distress. Denies SOB  Objective: Filed Vitals:   07/09/15 0500 07/09/15 0809  BP: 142/53 139/83  Pulse: 82 81  Temp: 98.4 F (36.9 C) 99.1 F (37.3 C)  Resp: 20 18    Intake/Output Summary (Last 24 hours) at 07/09/15 0924 Last data filed at 07/09/15 0829  Gross per 24 hour  Intake   1110 ml  Output   2254 ml  Net  -1144 ml   Filed Weights   07/07/15 2036 07/09/15 0500  Weight: 96.8 kg (213 lb 6.5 oz) 102.6 kg (226 lb 3.1 oz)    Exam:   General:  alert  Cardiovascular:  S1,s2 rrr  Respiratory: few crackles LL  Abdomen: soft, nt,nd   Musculoskeletal: no leg edema    Data Reviewed: Basic Metabolic Panel:  Recent Labs Lab 07/05/15 0433 07/06/15 0606 07/06/15 1414 07/07/15 0801 07/08/15 0700  NA 131* 130* 130* 133* 129*  K 3.6 3.6 3.5 3.6 4.0  CL 95* 96* 96* 96* 91*  CO2 22 23 23 23 25   GLUCOSE 179* 148* 164* 109* 158*  BUN 36* 47* 48* 28* 41*  CREATININE 8.76* 10.18* 10.97* 7.17* 8.74*  CALCIUM 8.1* 7.5* 7.4* 7.1* 7.6*  PHOS 6.9* 5.8* 6.0* 4.0 4.2   Liver Function Tests:  Recent Labs Lab 07/05/15 0433 07/06/15 0606 07/06/15 1414 07/07/15 0801 07/08/15 0700  ALBUMIN 1.7* 1.5* 1.6* 1.6* 1.6*   No results for input(s): LIPASE, AMYLASE in the last 168 hours. No results for input(s): AMMONIA in the last 168 hours. CBC:  Recent Labs Lab 07/05/15 0433 07/06/15 0606 07/06/15 1037 07/06/15 1414  07/07/15 0801 07/08/15 0700  WBC 7.3 5.9  --  6.0 4.9 5.4  HGB 8.4* 6.8* 7.4* 7.3* 7.1* 7.2*  HCT 25.0* 19.9* 22.6* 23.1* 21.6* 22.1*  MCV 84.2 83.3  --  84.3 84.4 85.0  PLT 206 198  --  220 204 272   Cardiac Enzymes: No results for input(s): CKTOTAL, CKMB, CKMBINDEX, TROPONINI in the last 168 hours. BNP (last 3 results) No results for input(s): BNP in the last 8760 hours.  ProBNP (last 3 results) No results for input(s): PROBNP in the last 8760 hours.  CBG:  Recent Labs Lab 07/07/15 2032 07/08/15 1244 07/08/15 1605 07/08/15 2159 07/09/15 0757  GLUCAP 176* 232* 224* 206* 156*    Recent Results (from the past 240 hour(s))  Surgical pcr screen     Status: None   Collection Time: 07/03/15  2:00 PM  Result Value Ref Range Status   MRSA, PCR NEGATIVE NEGATIVE Final   Staphylococcus aureus NEGATIVE NEGATIVE Final    Comment:        The Xpert SA Assay (FDA approved for NASAL specimens in patients over 44 years of age), is one component of a comprehensive surveillance program.  Test performance has been validated by [facility] for patients greater than or equal to 55 year old. It is not intended to diagnose infection nor to guide or monitor treatment.   C difficile quick scan w PCR reflex     Status: None   Collection Time: 07/05/15  9:47 AM  Result Value Ref Range Status   C Diff antigen NEGATIVE NEGATIVE Final   C Diff toxin NEGATIVE NEGATIVE Final   C Diff interpretation Negative for toxigenic C. difficile  Final     Studies: Dg Cervical Spine 2 Or 3 Views  07/07/2015  CLINICAL DATA:  Neck stiffness on waking up this morning. Pain. History of prior fall. Initial encounter. EXAM: CERVICAL SPINE - 2-3 VIEW COMPARISON:  None. FINDINGS: Straightening of the normal cervical lordosis is identified. There is multilevel loss of disc space height appearing worst a C5-6. Multilevel facet degenerative disease is also identified. Prevertebral soft tissues appear normal. Lung  apices are clear. IMPRESSION: No acute abnormality. Multilevel spondylosis. Electronically Signed   By: Inge Rise M.D.   On: 07/07/2015 14:40    Scheduled Meds: . antiseptic oral rinse  7 mL Mouth Rinse q12n4p  . bisacodyl  10 mg Rectal Once  . brimonidine  1 drop Both Eyes BID  . carvedilol  3.125 mg Oral BID WC  . chlorhexidine  15 mL Mouth Rinse BID  . cyclobenzaprine  7.5 mg Oral TID  . [START ON 07/10/2015] darbepoetin (ARANESP) injection - DIALYSIS  200 mcg Intravenous Q Fri-HD  . diclofenac sodium  2 g Topical QID  . feeding supplement (NEPRO CARB STEADY)  237 mL Oral BID BM  . fluticasone  1 spray Each Nare Daily  .  gabapentin  300 mg Oral TID  . insulin aspart  0-15 Units Subcutaneous TID WC  . insulin aspart  0-5 Units Subcutaneous QHS  . insulin aspart  6 Units Subcutaneous TID WC  . insulin glargine  15 Units Subcutaneous Daily  . latanoprost  1 drop Both Eyes QHS  . multivitamin  1 tablet Oral QHS  . pantoprazole  40 mg Oral Daily  . polyethylene glycol  17 g Oral Daily  . rosuvastatin  20 mg Oral Daily  . senna-docusate  1 tablet Oral BID  . sevelamer carbonate  1,600 mg Oral TID WC  . sodium chloride  3 mL Intravenous Q12H   Continuous Infusions: . sodium chloride 10 mL/hr at 07/03/15 1819  . sodium chloride 10 mL/hr at 07/03/15 2207    Principal Problem:   Diabetic foot infection ([place]) Active Problems:   Hypertension   Leukocytosis   Elevated troponin   Hyponatremia   Hypoalbuminemia   Hyperkalemia   Rhabdomyolysis   Metabolic acidosis   Acute renal failure (ARF) ([place])   DM type 2 causing renal disease ([place])   Dehydration   Debility   Ileus (HCC)   SBO (small bowel obstruction) ([place])   Gastrointestinal hemorrhage with hematemesis   Atrial fibrillation ([place])    Time spent: >35 minutes     Kinnie Feil  [facility] Pager [phone]. If 7PM-7AM, please contact night-coverage at [email or web address], password [facility] 07/09/2015, 9:24 AM   LOS: 22 days

## 2015-07-09 NOTE — Progress Notes (Signed)
Physical Therapy Treatment Patient Details Name: David Ford MRN: [id number] DOB: Feb 01, 1941 Today's Date: 07/09/2015    History of Present Illness 75 yo M with hx of DM, HTN Here with generalized weakness was found to have ARF most likely due to dehydration in the setting of ACEi and was found to have untreated diabetic foot ulcer.  Had ray amputation of  L foot on 07/03/15. rhabdomyolysis . ARF. SBO.     PT Comments    Pt with very slow progress; he is quite weak and deconditioned, HD schedule  pending; will continue to follow; strongly agree with plan for SNF  Follow Up Recommendations  SNF     Equipment Recommendations  None recommended by PT    Recommendations for Other Services       Precautions / Restrictions Precautions Precautions: Fall Required Braces or Orthoses: Other Brace/Splint Other Brace/Splint: darco shoe and post op shoe Restrictions Weight Bearing Restrictions: Yes LLE Weight Bearing: Non weight bearing    Mobility  Bed Mobility Overal bed mobility: Needs Assistance Bed Mobility: Supine to Sit     Supine to sit: Min assist     General bed mobility comments: multi-modal  cues for use of UEs to self assist, min assist to bring LEs off bed and then light facilitation to come to full upright; pt is effortful and requires incr time  Transfers Overall transfer level: Needs assistance   Transfers: Lateral/Scoot Transfers          Lateral/Scoot Transfers: Mod assist General transfer comment: sit to stand NT with +1 assist; completed bed to chair transfer with assist for wt shift and cues for use of UEs, safety and NWB on LLE  Ambulation/Gait             General Gait Details: NT with +1   Stairs            Wheelchair Mobility    Modified Rankin (Stroke Patients Only)       Balance   Sitting-balance support: No upper extremity supported;Feet supported Sitting balance-Leahy Scale: Fair Sitting balance - Comments: flexed  posture, unable to hold head in neutral--maintains significant cervical flexion Postural control: Right lateral lean;Posterior lean                          Cognition Arousal/Alertness: Awake/alert Behavior During Therapy: Flat affect   Area of Impairment: Safety/judgement;Awareness       Following Commands: Follows one step commands with increased time Safety/Judgement: Decreased awareness of deficits   Problem Solving: Difficulty sequencing;Requires verbal cues General Comments: Pt requires multi-moal cues for NWBing    Exercises General Exercises - Lower Extremity Ankle Circles/Pumps: AROM;Both;10 reps;Supine Quad Sets: AROM;Strengthening;Both;10 reps Gluteal Sets: Strengthening;Both;10 reps Hip ABduction/ADduction: Strengthening;Both;10 reps Straight Leg Raises: Strengthening;Both;10 reps;Supine Other Exercises Other Exercises: gentle cervical ROM, lateral flexion, extension    General Comments        Pertinent Vitals/Pain Pain Assessment: Faces Faces Pain Scale: Hurts little more Pain Location: neck  Pain Descriptors / Indicators: Grimacing;Guarding Pain Intervention(s): Limited activity within patient's tolerance;Monitored during session;Repositioned    Home Living                      Prior Function            PT Goals (current goals can now be found in the care plan section) Acute Rehab PT Goals Patient Stated Goal: none stated PT Goal Formulation: With patient Time For  Goal Achievement: 07/20/15 Potential to Achieve Goals: Good Progress towards PT goals: Progressing toward goals    Frequency  Min 3X/week    PT Plan Current plan remains appropriate    Co-evaluation             End of Session   Activity Tolerance: Patient limited by fatigue Patient left: in chair;with call bell/phone within reach;with chair alarm set     Time: [id number] PT Time Calculation (min) (ACUTE ONLY): 20 min  Charges:  $Therapeutic Activity:  8-22 mins                    G Codes:      Chinara Hertzberg 07/17/15, 12:18 PM

## 2015-07-10 ENCOUNTER — Inpatient Hospital Stay (HOSPITAL_COMMUNITY): Payer: Medicare HMO

## 2015-07-10 LAB — BASIC METABOLIC PANEL
ANION GAP: 14 (ref 5–15)
BUN: 38 mg/dL — ABNORMAL HIGH (ref 6–20)
CALCIUM: 7.8 mg/dL — AB (ref 8.9–10.3)
CHLORIDE: 90 mmol/L — AB (ref 101–111)
CO2: 23 mmol/L (ref 22–32)
Creatinine, Ser: 8.39 mg/dL — ABNORMAL HIGH (ref 0.61–1.24)
GFR calc non Af Amer: 6 mL/min — ABNORMAL LOW (ref >60)
GFR, EST AFRICAN AMERICAN: 6 mL/min — AB (ref >60)
GLUCOSE: 168 mg/dL — AB (ref 65–99)
POTASSIUM: 4.4 mmol/L (ref 3.5–5.1)
Sodium: 127 mmol/L — ABNORMAL LOW (ref 135–145)

## 2015-07-10 LAB — RENAL FUNCTION PANEL
ALBUMIN: 1.7 g/dL — AB (ref 3.5–5.0)
ANION GAP: 11 (ref 5–15)
BUN: 37 mg/dL — AB (ref 6–20)
CALCIUM: 7.8 mg/dL — AB (ref 8.9–10.3)
CO2: 25 mmol/L (ref 22–32)
CREATININE: 8.39 mg/dL — AB (ref 0.61–1.24)
Chloride: 92 mmol/L — ABNORMAL LOW (ref 101–111)
GFR, EST AFRICAN AMERICAN: 6 mL/min — AB (ref >60)
GFR, EST NON AFRICAN AMERICAN: 6 mL/min — AB (ref >60)
GLUCOSE: 212 mg/dL — AB (ref 65–99)
PHOSPHORUS: 4.6 mg/dL (ref 2.5–4.6)
POTASSIUM: 4.4 mmol/L (ref 3.5–5.1)
Sodium: 128 mmol/L — ABNORMAL LOW (ref 135–145)

## 2015-07-10 LAB — GLUCOSE, CAPILLARY
GLUCOSE-CAPILLARY: 233 mg/dL — AB (ref 65–99)
GLUCOSE-CAPILLARY: 211 mg/dL — AB (ref 65–99)
Glucose-Capillary: 235 mg/dL — ABNORMAL HIGH (ref 65–99)

## 2015-07-10 LAB — CBC
HCT: 21.9 % — ABNORMAL LOW (ref 39.0–52.0)
HEMATOCRIT: 21.8 % — AB (ref 39.0–52.0)
HEMOGLOBIN: 7.2 g/dL — AB (ref 13.0–17.0)
Hemoglobin: 7.2 g/dL — ABNORMAL LOW (ref 13.0–17.0)
MCH: 27.9 pg (ref 26.0–34.0)
MCH: 27.5 pg (ref 26.0–34.0)
MCHC: 33 g/dL (ref 30.0–36.0)
MCHC: 32.9 g/dL (ref 30.0–36.0)
MCV: 84.5 fL (ref 78.0–100.0)
MCV: 83.6 fL (ref 78.0–100.0)
PLATELETS: 311 10*3/uL (ref 150–400)
Platelets: 304 10*3/uL (ref 150–400)
RBC: 2.58 MIL/uL — ABNORMAL LOW (ref 4.22–5.81)
RBC: 2.62 MIL/uL — ABNORMAL LOW (ref 4.22–5.81)
RDW: 14.2 % (ref 11.5–15.5)
RDW: 14.2 % (ref 11.5–15.5)
WBC: 6.1 10*3/uL (ref 4.0–10.5)
WBC: 6 10*3/uL (ref 4.0–10.5)

## 2015-07-10 LAB — IRON AND TIBC
IRON: 9 ug/dL — AB (ref 45–182)
SATURATION RATIOS: 5 % — AB (ref 17.9–39.5)
TIBC: 167 ug/dL — AB (ref 250–450)
UIBC: 158 ug/dL

## 2015-07-10 MED ORDER — DOCUSATE SODIUM 100 MG PO CAPS
200.0000 mg | ORAL_CAPSULE | Freq: Every day | ORAL | Status: DC
  Administered 2015-07-10 – 2015-07-15 (×5): 200 mg via ORAL
  Filled 2015-07-10 (×7): qty 2

## 2015-07-10 MED ORDER — HEPARIN SODIUM (PORCINE) 1000 UNIT/ML DIALYSIS: 1000.0000 [IU] | INTRAMUSCULAR | Status: DC | PRN

## 2015-07-10 MED ORDER — DARBEPOETIN ALFA 200 MCG/0.4ML IJ SOSY
PREFILLED_SYRINGE | INTRAMUSCULAR | Status: AC
  Filled 2015-07-10: qty 0.4

## 2015-07-10 MED ORDER — CALCITRIOL 0.5 MCG PO CAPS
0.5000 ug | ORAL_CAPSULE | ORAL | Status: DC
  Administered 2015-07-10 – 2015-07-13 (×2): 0.5 ug via ORAL
  Filled 2015-07-10: qty 1

## 2015-07-10 MED ORDER — SODIUM CHLORIDE 0.9 % IV SOLN: 100.0000 mL | INTRAVENOUS | Status: DC | PRN

## 2015-07-10 MED ORDER — PENTAFLUOROPROP-TETRAFLUOROETH EX AERO: 1.0000 "application " | INHALATION_SPRAY | CUTANEOUS | Status: DC | PRN

## 2015-07-10 MED ORDER — CALCITRIOL 0.5 MCG PO CAPS
ORAL_CAPSULE | ORAL | Status: AC
  Filled 2015-07-10: qty 1

## 2015-07-10 MED ORDER — ALTEPLASE 2 MG IJ SOLR
2.0000 mg | Freq: Once | INTRAMUSCULAR | Status: DC | PRN
  Filled 2015-07-10: qty 2

## 2015-07-10 MED ORDER — POLYETHYLENE GLYCOL 3350 17 G PO PACK: 17.0000 g | PACK | Freq: Every day | ORAL | Status: DC

## 2015-07-10 MED ORDER — LIDOCAINE-PRILOCAINE 2.5-2.5 % EX CREA
1.0000 "application " | TOPICAL_CREAM | CUTANEOUS | Status: DC | PRN
  Filled 2015-07-10: qty 5

## 2015-07-10 MED ORDER — LIDOCAINE HCL (PF) 1 % IJ SOLN: 5.0000 mL | INTRAMUSCULAR | Status: DC | PRN

## 2015-07-10 MED ORDER — SODIUM CHLORIDE 0.9 % IV SOLN
125.0000 mg | INTRAVENOUS | Status: DC
  Administered 2015-07-10 – 2015-07-13 (×2): 125 mg via INTRAVENOUS
  Filled 2015-07-10 (×5): qty 10

## 2015-07-10 MED ORDER — HEPARIN SODIUM (PORCINE) 1000 UNIT/ML DIALYSIS: 100.0000 [IU]/kg | INTRAMUSCULAR | Status: DC | PRN

## 2015-07-10 NOTE — Progress Notes (Signed)
   Daily Progress Note  Vein mapping appears compatible with L 1st stage brachial vein transposition.  Earliest can get on schedule is this coming Tuesday.  Pt does not need to stay in the hospital from my viewpoint for the procedure.  - will be by on Monday to discuss with the patient  Adele Barthel, MD [facility] Office: [phone] Pager: [phone]  07/10/2015, 8:15 AM

## 2015-07-10 NOTE — Clinical Social Work Note (Signed)
CSW continuing to follow patient's progress and will facilitate discharge to a skilled facility when medically stable. Patient's son provided with bed offers and CSW will confirm facility choice with patient/son David Ford.   Ahmyah Gidley Givens, MSW, LCSW Licensed Clinical Social Worker [place] [phone]

## 2015-07-10 NOTE — Progress Notes (Signed)
Subjective: Interval History: constip.  Objective: Vital signs in last 24 hours: Temp:  [99 F (37.2 C)-100.9 F (38.3 C)] 99 F (37.2 C) (01/20 0812) Pulse Rate:  [73-91] 91 (01/20 0830) Resp:  [14-19] 14 (01/20 0812) BP: (118-144)/(58-106) 129/106 mmHg (01/20 0830) SpO2:  [97 %-99 %] 99 % (01/20 0812) Weight:  [98 kg (216 lb 0.8 oz)-98.4 kg (216 lb 14.9 oz)] 98 kg (216 lb 0.8 oz) (01/20 0812) Weight change: -4.2 kg (-9 lb 4.2 oz)  Intake/Output from previous day: 01/19 0701 - 01/20 0700 In: 1170 [P.O.:1170] Out: 75 [Urine:75] Intake/Output this shift:    General appearance: alert, cooperative, no distress and moderately obese Resp: clear to auscultation bilaterally Chest wall: RIJ cath Cardio: S1, S2 normal and systolic murmur: holosystolic 2/6, blowing at apex GI: obese, pos bs, liver down 5 cm Extremities: L foot wrapped  Lab Results:  Recent Labs  07/10/15 0600 07/10/15 0830  WBC 6.1 6.0  HGB 7.2* 7.2*  HCT 21.8* 21.9*  PLT 304 311   BMET:  Recent Labs  07/08/15 0700 07/08/15 0808 07/10/15 0600  NA 129*  --  127*  K 4.0  --  4.4  CL 91*  --  90*  CO2 25  --  23  GLUCOSE 158*  --  168*  BUN 41*  --  38*  CREATININE 8.74*  --  8.39*  CALCIUM 7.6* 7.5* 7.8*    Recent Labs  07/08/15 0808  PTH 197*  Comment   Iron Studies: No results for input(s): IRON, TIBC, TRANSFERRIN, FERRITIN in the last 72 hours.  Studies/Results: No results found.  I have reviewed the patient's current medications.  Assessment/Plan: 1 CKD4/AKI no recovery, will get access next week. HD today.   2 anemai esa/Fe 3 HPTH vit D 4 obesity 5 DM controlled 6 PVD s/p ray amp per Ortho P HD, mobilize, access,     LOS: 23 days   Eric Morganti L 07/10/2015,8:59 AM

## 2015-07-10 NOTE — Progress Notes (Addendum)
[facility] PROGRESS NOTE  David Ford [id number] DOB: 1940/08/29 DOA: 06/17/2015 PCP: No primary care provider on file.  Assessment/Plan: 75 y.o. male with DM, HTN admitted after a fall from his couch and being on the floor for about 3 days. His found by a friend who then called EMS.  -admitted with renal failure, also noted to have rhabdomyolysis, glucose of 454, acute renal failure with a creatinine of 4 and a potassium of 6.0. WBC count was also elevated at 16.2. He was noted to have gangrenous toes on his left foot. He underwent amputation on 1/13. He is still requiring dialysis and the renal team is still trying to determine if his renal function will recuperated. He received a temp right IJ tunneled dialysis cath on 1/16. Renal team attempting to arrange outpatient dialysis for him. Vascular surgery plans for AVG next week. Pt also developed mild fever. Will obtain blood cultures, CXR    1. Diabetic foot infection. Pt underwent amputation of left 1st and 2nd ray on 1/13. He is completed IV vancomycin and Zosyn  2. Acute renal failure. Severe Rhabdomyolysis. AKI likely due to Rhabdomyolysis and sepsis from gangrenous toes -probable ESRD. Unfortunately renal function did not recover, and Pt is started on dialysis treatments when first admitted and continues to require dialysis. Nephrology team managing. Scheduled for access placement next week per [facility] 3. Neck pain possibly sprain. X ray: no acute fractures. cont Voltaren gel, Flexeril Q 8 hrs and Narcotics PRN if pain is significant 4. Episode of upper GI bleed. Suspected be secondary to NG tube insertion and gastritis - was started on BID Protonix which I have decreased to daily 5. AoCD. Renal. transfused 2 U PRBC. cont to follow Hb and transfuse as needed for Hb < 7 with HD. No s/s of active bleeding  6. Partial small bowel obstruction Resolved with conservative management 7. Atrial fibrillation with RVR Occurred on hemodialysis  on 12/1. 2-D echo revealed EF of 50-55% and grade 1 diastolic dysfunction. TSH was normal at 0.653 on 12/31.  -No further episodes noted since then- will hold off on anticoagulation  8. DM2 ha1c-16.2 cont Lantus and Insulin. Titrate as needed 9. Essential HTN. coreg and Hydralazine 10. Fever on 1/19. No leukocytosis. Pt reports some cough. We will obtain CXR. Blood culture-recently placed tunneled cath.  -recently completed IV atx. c diff was neg (07/05/15). Cont monitor    Code Status: full Family Communication: d/w patient, RN (indicate person spoken with, relationship, and if by phone, the number) Disposition Plan: pend HD arrangement    Consultants:  Nephrology  IR  Ortho    Procedures:  S/p left 1st and 2nd ray on 1/13   IJ tunneled dialysis cath on 1/16  Antibiotics:  vanc 12/28-1/4  Zosyn 12/28-1/4 then 1/01/19/11  (indicate start date, and stop date if known)  HPI/Subjective: Alert. No distress. Denies SOB  Objective: Filed Vitals:   07/10/15 1230 07/10/15 1311  BP: 139/84 135/55  Pulse: 99 105  Temp: 99 F (37.2 C) 100 F (37.8 C)  Resp: 18 17    Intake/Output Summary (Last 24 hours) at 07/10/15 1339 Last data filed at 07/10/15 1230  Gross per 24 hour  Intake      0 ml  Output   2567 ml  Net  -2567 ml   Filed Weights   07/09/15 2135 07/10/15 0812 07/10/15 1230  Weight: 98.4 kg (216 lb 14.9 oz) 98 kg (216 lb 0.8 oz) 95.7 kg (210 lb 15.7 oz)  Exam:   General:  alert  Cardiovascular:  S1,s2 rrr  Respiratory: few crackles LL  Abdomen: soft, nt,nd   Musculoskeletal: no leg edema    Data Reviewed: Basic Metabolic Panel:  Recent Labs Lab 07/06/15 0606 07/06/15 1414 07/07/15 0801 07/08/15 0700 07/08/15 0808 07/10/15 0600 07/10/15 0830  NA 130* 130* 133* 129*  --  127* 128*  K 3.6 3.5 3.6 4.0  --  4.4 4.4  CL 96* 96* 96* 91*  --  90* 92*  CO2 23 23 23 25   --  23 25  GLUCOSE 148* 164* 109* 158*  --  168* 212*  BUN 47* 48* 28* 41*   --  38* 37*  CREATININE 10.18* 10.97* 7.17* 8.74*  --  8.39* 8.39*  CALCIUM 7.5* 7.4* 7.1* 7.6* 7.5* 7.8* 7.8*  PHOS 5.8* 6.0* 4.0 4.2  --   --  4.6   Liver Function Tests:  Recent Labs Lab 07/06/15 0606 07/06/15 1414 07/07/15 0801 07/08/15 0700 07/10/15 0830  ALBUMIN 1.5* 1.6* 1.6* 1.6* 1.7*   No results for input(s): LIPASE, AMYLASE in the last 168 hours. No results for input(s): AMMONIA in the last 168 hours. CBC:  Recent Labs Lab 07/06/15 1414 07/07/15 0801 07/08/15 0700 07/10/15 0600 07/10/15 0830  WBC 6.0 4.9 5.4 6.1 6.0  HGB 7.3* 7.1* 7.2* 7.2* 7.2*  HCT 23.1* 21.6* 22.1* 21.8* 21.9*  MCV 84.3 84.4 85.0 84.5 83.6  PLT 220 204 272 304 311   Cardiac Enzymes: No results for input(s): CKTOTAL, CKMB, CKMBINDEX, TROPONINI in the last 168 hours. BNP (last 3 results) No results for input(s): BNP in the last 8760 hours.  ProBNP (last 3 results) No results for input(s): PROBNP in the last 8760 hours.  CBG:  Recent Labs Lab 07/09/15 0757 07/09/15 1147 07/09/15 1641 07/09/15 2144 07/10/15 1308  GLUCAP 156* 201* 119* 120* 235*    Recent Results (from the past 240 hour(s))  Surgical pcr screen     Status: None   Collection Time: 07/03/15  2:00 PM  Result Value Ref Range Status   MRSA, PCR NEGATIVE NEGATIVE Final   Staphylococcus aureus NEGATIVE NEGATIVE Final    Comment:        The Xpert SA Assay (FDA approved for NASAL specimens in patients over 11 years of age), is one component of a comprehensive surveillance program.  Test performance has been validated by [facility] for patients greater than or equal to 83 year old. It is not intended to diagnose infection nor to guide or monitor treatment.   C difficile quick scan w PCR reflex     Status: None   Collection Time: 07/05/15  9:47 AM  Result Value Ref Range Status   C Diff antigen NEGATIVE NEGATIVE Final   C Diff toxin NEGATIVE NEGATIVE Final   C Diff interpretation Negative for toxigenic C.  difficile  Final     Studies: No results found.  Scheduled Meds: . antiseptic oral rinse  7 mL Mouth Rinse q12n4p  . brimonidine  1 drop Both Eyes BID  . calcitRIOL  0.5 mcg Oral Q M,W,F-HD  . carvedilol  3.125 mg Oral BID WC  . chlorhexidine  15 mL Mouth Rinse BID  . cyclobenzaprine  7.5 mg Oral TID  . darbepoetin (ARANESP) injection - DIALYSIS  200 mcg Intravenous Q Fri-HD  . diclofenac sodium  2 g Topical QID  . docusate sodium  200 mg Oral Daily  . feeding supplement (NEPRO CARB STEADY)  237 mL Oral  BID BM  . ferric gluconate (FERRLECIT/NULECIT) IV  125 mg Intravenous Q M,W,F-HD  . fluticasone  1 spray Each Nare Daily  . gabapentin  300 mg Oral TID  . insulin aspart  0-15 Units Subcutaneous TID WC  . insulin aspart  0-5 Units Subcutaneous QHS  . insulin aspart  6 Units Subcutaneous TID WC  . insulin glargine  15 Units Subcutaneous Daily  . latanoprost  1 drop Both Eyes QHS  . multivitamin  1 tablet Oral QHS  . pantoprazole  40 mg Oral Daily  . polyethylene glycol  17 g Oral Daily  . rosuvastatin  20 mg Oral Daily  . senna-docusate  1 tablet Oral BID  . sevelamer carbonate  1,600 mg Oral TID WC  . sodium chloride  3 mL Intravenous Q12H   Continuous Infusions: . sodium chloride 10 mL/hr at 07/03/15 1819  . sodium chloride 10 mL/hr at 07/03/15 2207    Principal Problem:   Diabetic foot infection ([place]) Active Problems:   Hypertension   Leukocytosis   Elevated troponin   Hyponatremia   Hypoalbuminemia   Hyperkalemia   Rhabdomyolysis   Metabolic acidosis   Acute renal failure (ARF) ([place])   DM type 2 causing renal disease ([place])   Dehydration   Debility   Ileus (HCC)   SBO (small bowel obstruction) ([place])   Gastrointestinal hemorrhage with hematemesis   Atrial fibrillation ([place])    Time spent: >35 minutes     Kinnie Feil  [facility] Pager [phone]. If 7PM-7AM, please contact night-coverage at [email or web address], password [facility] 07/10/2015, 1:39 PM   LOS: 23 days

## 2015-07-10 NOTE — Procedures (Signed)
I was present at this session.  I have reviewed the session itself and made appropriate changes.  HD via PC. BFR 400.  bp 120-140 sys. tol well  Nakaya Mishkin L 1/20/20178:56 AM

## 2015-07-11 LAB — CBC
HEMATOCRIT: 19.9 % — AB (ref 39.0–52.0)
Hemoglobin: 6.6 g/dL — CL (ref 13.0–17.0)
MCH: 27.5 pg (ref 26.0–34.0)
MCHC: 33.2 g/dL (ref 30.0–36.0)
MCV: 82.9 fL (ref 78.0–100.0)
Platelets: 328 10*3/uL (ref 150–400)
RBC: 2.4 MIL/uL — ABNORMAL LOW (ref 4.22–5.81)
RDW: 14.2 % (ref 11.5–15.5)
WBC: 5.6 10*3/uL (ref 4.0–10.5)

## 2015-07-11 LAB — GLUCOSE, CAPILLARY
GLUCOSE-CAPILLARY: 145 mg/dL — AB (ref 65–99)
GLUCOSE-CAPILLARY: 120 mg/dL — AB (ref 65–99)
Glucose-Capillary: 139 mg/dL — ABNORMAL HIGH (ref 65–99)
Glucose-Capillary: 149 mg/dL — ABNORMAL HIGH (ref 65–99)

## 2015-07-11 LAB — PREPARE RBC (CROSSMATCH)

## 2015-07-11 MED ORDER — SODIUM CHLORIDE 0.9 % IV SOLN: Freq: Once | INTRAVENOUS | Status: DC

## 2015-07-11 NOTE — Progress Notes (Signed)
[facility] PROGRESS NOTE  Zygmunt Kurkowski [id number] DOB: 08-Dec-1940 DOA: 06/17/2015 PCP: No primary care provider on file.  Assessment/Plan: 75 y.o. male with DM, HTN admitted after a fall from his couch and being on the floor for about 3 days. His found by a friend who then called EMS.  -admitted with renal failure, noted to have rhabdomyolysis, glucose of 454, acute renal failure with a creatinine of 4 and a potassium of 6.0. WBC count was also elevated at 16.2. He was noted to have gangrenous toes on his left foot. He underwent amputation on 1/13. He is still requiring dialysis and the renal team is still trying to determine if his renal function will recuperated. He received a temp right IJ tunneled dialysis cath on 1/16. Renal team attempting to arrange outpatient dialysis for him. Vascular surgery plans for AVG next week.   1. Diabetic foot infection. Pt underwent amputation of left 1st and 2nd ray on 1/13. He is completed IV vancomycin and Zosyn  2. Acute renal failure. Severe Rhabdomyolysis. AKI likely due to Rhabdomyolysis and sepsis from gangrenous toes -probable ESRD. Unfortunately renal function did not recover, and Pt is started on dialysis treatments when first admitted and continues to require dialysis. Nephrology team managing. Scheduled for access placement next week per [facility] 3. Neck pain possibly sprain. X ray: no acute fractures. cont Voltaren gel, Flexeril Q 8 hrs and Narcotics PRN if pain is significant 4. Episode of upper GI bleed secondary to NG tube insertion and gastritis - was started on BID Protonix which I have decreased to daily 5. AoCD. Renal. Initially transfused 2 U PRBC on admission. No s/s of active bleeding. But hg is 6.8 today. We will Tf 1 more unit. Monitor. Tf as needed with HD 6. Partial small bowel obstruction Resolved with conservative management 7. Atrial fibrillation with RVR Occurred on hemodialysis on 12/1. 2-D echo revealed EF of 50-55% and  grade 1 diastolic dysfunction. TSH was normal at 0.653 on 12/31.  -No further episodes noted since then- will hold off on anticoagulation  8. DM2 ha1c-16.2 cont Lantus and Insulin. Titrate as needed 9. Essential HTN. coreg and Hydralazine 10. Fever on 1/19. No leukocytosis. CXR: no clear infiltrates. Blood culture-pend  -recently completed IV atx. c diff was neg (07/05/15). Cont monitor    Code Status: full Family Communication: d/w patient, Therapist, sports. Pt's ex wife  (indicate person spoken with, relationship, and if by phone, the number) Disposition Plan: pend HD arrangement    Consultants:  Nephrology  IR  Ortho    Procedures:  S/p left 1st and 2nd ray on 1/13   IJ tunneled dialysis cath on 1/16  Antibiotics:  vanc 12/28-1/4  Zosyn 12/28-1/4 then 1/01/19/11  (indicate start date, and stop date if known)  HPI/Subjective: Alert. No distress. Denies SOB  Objective: Filed Vitals:   07/11/15 0708 07/11/15 0824  BP: 128/53 140/56  Pulse: 80 81  Temp: 99.5 F (37.5 C) 99.8 F (37.7 C)  Resp: 19 18    Intake/Output Summary (Last 24 hours) at 07/11/15 1229 Last data filed at 07/11/15 0824  Gross per 24 hour  Intake    600 ml  Output   2567 ml  Net  -1967 ml   Filed Weights   07/10/15 0812 07/10/15 1230 07/10/15 2015  Weight: 98 kg (216 lb 0.8 oz) 95.7 kg (210 lb 15.7 oz) 96 kg (211 lb 10.3 oz)    Exam:   General:  alert  Cardiovascular:  S1,s2 rrr  Respiratory: few crackles LL  Abdomen: soft, nt,nd   Musculoskeletal: no leg edema    Data Reviewed: Basic Metabolic Panel:  Recent Labs Lab 07/06/15 0606 07/06/15 1414 07/07/15 0801 07/08/15 0700 07/08/15 0808 07/10/15 0600 07/10/15 0830  NA 130* 130* 133* 129*  --  127* 128*  K 3.6 3.5 3.6 4.0  --  4.4 4.4  CL 96* 96* 96* 91*  --  90* 92*  CO2 23 23 23 25   --  23 25  GLUCOSE 148* 164* 109* 158*  --  168* 212*  BUN 47* 48* 28* 41*  --  38* 37*  CREATININE 10.18* 10.97* 7.17* 8.74*  --  8.39* 8.39*   CALCIUM 7.5* 7.4* 7.1* 7.6* 7.5* 7.8* 7.8*  PHOS 5.8* 6.0* 4.0 4.2  --   --  4.6   Liver Function Tests:  Recent Labs Lab 07/06/15 0606 07/06/15 1414 07/07/15 0801 07/08/15 0700 07/10/15 0830  ALBUMIN 1.5* 1.6* 1.6* 1.6* 1.7*   No results for input(s): LIPASE, AMYLASE in the last 168 hours. No results for input(s): AMMONIA in the last 168 hours. CBC:  Recent Labs Lab 07/07/15 0801 07/08/15 0700 07/10/15 0600 07/10/15 0830 07/11/15 0449  WBC 4.9 5.4 6.1 6.0 5.6  HGB 7.1* 7.2* 7.2* 7.2* 6.6*  HCT 21.6* 22.1* 21.8* 21.9* 19.9*  MCV 84.4 85.0 84.5 83.6 82.9  PLT 204 272 304 311 328   Cardiac Enzymes: No results for input(s): CKTOTAL, CKMB, CKMBINDEX, TROPONINI in the last 168 hours. BNP (last 3 results) No results for input(s): BNP in the last 8760 hours.  ProBNP (last 3 results) No results for input(s): PROBNP in the last 8760 hours.  CBG:  Recent Labs Lab 07/10/15 1308 07/10/15 1631 07/10/15 2108 07/11/15 0822 07/11/15 1214  GLUCAP 235* 233* 211* 139* 145*    Recent Results (from the past 240 hour(s))  Surgical pcr screen     Status: None   Collection Time: 07/03/15  2:00 PM  Result Value Ref Range Status   MRSA, PCR NEGATIVE NEGATIVE Final   Staphylococcus aureus NEGATIVE NEGATIVE Final    Comment:        The Xpert SA Assay (FDA approved for NASAL specimens in patients over 60 years of age), is one component of a comprehensive surveillance program.  Test performance has been validated by [facility] for patients greater than or equal to 7 year old. It is not intended to diagnose infection nor to guide or monitor treatment.   C difficile quick scan w PCR reflex     Status: None   Collection Time: 07/05/15  9:47 AM  Result Value Ref Range Status   C Diff antigen NEGATIVE NEGATIVE Final   C Diff toxin NEGATIVE NEGATIVE Final   C Diff interpretation Negative for toxigenic C. difficile  Final  Culture, blood (routine x 2)     Status: None  (Preliminary result)   Collection Time: 07/10/15  4:15 PM  Result Value Ref Range Status   Specimen Description BLOOD LEFT HAND  Final   Special Requests IN PEDIATRIC BOTTLE 3CC  Final   Culture NO GROWTH < 24 HOURS  Final   Report Status PENDING  Incomplete  Culture, blood (routine x 2)     Status: None (Preliminary result)   Collection Time: 07/10/15  4:25 PM  Result Value Ref Range Status   Specimen Description BLOOD LEFT HAND  Final   Special Requests IN PEDIATRIC BOTTLE 2CC  Final   Culture NO GROWTH < 24 HOURS  Final   Report Status PENDING  Incomplete     Studies: Dg Chest 2 View  07/10/2015  CLINICAL DATA:  Fever, abdominal bloating. EXAM: CHEST  2 VIEW COMPARISON:  06/19/2015 FINDINGS: Right dialysis catheter in place with the tip in the SVC. Heart is mildly enlarged. No confluent airspace opacities, effusions or edema. No acute bony abnormality. IMPRESSION: Borderline cardiomegaly.  No active disease. Electronically Signed   By: Rolm Baptise M.D.   On: 07/10/2015 14:50   Dg Abd 1 View  07/10/2015  CLINICAL DATA:  Fever, abdominal bloating. EXAM: ABDOMEN - 1 VIEW COMPARISON:  06/23/2015 FINDINGS: Gas throughout nondistended stomach, large and small bowel. No organomegaly or free air. No suspicious calcification. Radiation seeds in the region of the prostate. IMPRESSION: No acute findings. Gas throughout nondistended large and small bowel. Electronically Signed   By: Rolm Baptise M.D.   On: 07/10/2015 14:51    Scheduled Meds: . antiseptic oral rinse  7 mL Mouth Rinse q12n4p  . brimonidine  1 drop Both Eyes BID  . calcitRIOL  0.5 mcg Oral Q M,W,F-HD  . carvedilol  3.125 mg Oral BID WC  . chlorhexidine  15 mL Mouth Rinse BID  . cyclobenzaprine  7.5 mg Oral TID  . darbepoetin (ARANESP) injection - DIALYSIS  200 mcg Intravenous Q Fri-HD  . diclofenac sodium  2 g Topical QID  . docusate sodium  200 mg Oral Daily  . feeding supplement (NEPRO CARB STEADY)  237 mL Oral BID BM  .  ferric gluconate (FERRLECIT/NULECIT) IV  125 mg Intravenous Q M,W,F-HD  . fluticasone  1 spray Each Nare Daily  . gabapentin  300 mg Oral TID  . insulin aspart  0-15 Units Subcutaneous TID WC  . insulin aspart  0-5 Units Subcutaneous QHS  . insulin aspart  6 Units Subcutaneous TID WC  . insulin glargine  15 Units Subcutaneous Daily  . latanoprost  1 drop Both Eyes QHS  . multivitamin  1 tablet Oral QHS  . pantoprazole  40 mg Oral Daily  . polyethylene glycol  17 g Oral Daily  . rosuvastatin  20 mg Oral Daily  . senna-docusate  1 tablet Oral BID  . sevelamer carbonate  1,600 mg Oral TID WC  . sodium chloride  3 mL Intravenous Q12H   Continuous Infusions: . sodium chloride 10 mL/hr at 07/03/15 1819  . sodium chloride 10 mL/hr at 07/03/15 2207    Principal Problem:   Diabetic foot infection ([place]) Active Problems:   Hypertension   Leukocytosis   Elevated troponin   Hyponatremia   Hypoalbuminemia   Hyperkalemia   Rhabdomyolysis   Metabolic acidosis   Acute renal failure (ARF) ([place])   DM type 2 causing renal disease ([place])   Dehydration   Debility   Ileus (HCC)   SBO (small bowel obstruction) ([place])   Gastrointestinal hemorrhage with hematemesis   Atrial fibrillation ([place])    Time spent: >35 minutes     Kinnie Feil  [facility] Pager [phone]. If 7PM-7AM, please contact night-coverage at [email or web address], password [facility] 07/11/2015, 12:29 PM  LOS: 24 days

## 2015-07-11 NOTE — Progress Notes (Signed)
Subjective: Interval History: has no complaint .  Objective: Vital signs in last 24 hours: Temp:  [98.4 F (36.9 C)-100 F (37.8 C)] 99.8 F (37.7 C) (01/21 0824) Pulse Rate:  [80-112] 81 (01/21 0824) Resp:  [15-20] 18 (01/21 0824) BP: (121-154)/(53-84) 140/56 mmHg (01/21 0824) SpO2:  [99 %-100 %] 100 % (01/21 0824) Weight:  [95.7 kg (210 lb 15.7 oz)-96 kg (211 lb 10.3 oz)] 96 kg (211 lb 10.3 oz) (01/20 2015) Weight change: -0.4 kg (-14.1 oz)  Intake/Output from previous day: 01/20 0701 - 01/21 0700 In: 480 [P.O.:480] Out: 2567  Intake/Output this shift: Total I/O In: 120 [P.O.:120] Out: 0   General appearance: alert, cooperative, no distress and mildly obese Resp: diminished breath sounds bilaterally Chest wall: RIJ cath Cardio: S1, S2 normal and systolic murmur: holosystolic 2/6, blowing at apex GI: obese, pos bs, liver down 5 cm Extremities: dressing L foot  Lab Results:  Recent Labs  07/10/15 0830 07/11/15 0449  WBC 6.0 5.6  HGB 7.2* 6.6*  HCT 21.9* 19.9*  PLT 311 328   BMET:  Recent Labs  07/10/15 0600 07/10/15 0830  NA 127* 128*  K 4.4 4.4  CL 90* 92*  CO2 23 25  GLUCOSE 168* 212*  BUN 38* 37*  CREATININE 8.39* 8.39*  CALCIUM 7.8* 7.8*   No results for input(s): PTH in the last 72 hours. Iron Studies:  Recent Labs  07/10/15 0845  IRON 9*  TIBC 167*    Studies/Results: Dg Chest 2 View  07/10/2015  CLINICAL DATA:  Fever, abdominal bloating. EXAM: CHEST  2 VIEW COMPARISON:  06/19/2015 FINDINGS: Right dialysis catheter in place with the tip in the SVC. Heart is mildly enlarged. No confluent airspace opacities, effusions or edema. No acute bony abnormality. IMPRESSION: Borderline cardiomegaly.  No active disease. Electronically Signed   By: Rolm Baptise M.D.   On: 07/10/2015 14:50   Dg Abd 1 View  07/10/2015  CLINICAL DATA:  Fever, abdominal bloating. EXAM: ABDOMEN - 1 VIEW COMPARISON:  06/23/2015 FINDINGS: Gas throughout nondistended stomach,  large and small bowel. No organomegaly or free air. No suspicious calcification. Radiation seeds in the region of the prostate. IMPRESSION: No acute findings. Gas throughout nondistended large and small bowel. Electronically Signed   By: Rolm Baptise M.D.   On: 07/10/2015 14:51    I have reviewed the patient's current medications.  Assessment/Plan: 1 CKD/AKI nonresolving  No urine.  Will get access this week 2 Anemia Fe, ESA 3 HPTH vit D 4 PVD 5 Debill mobilize 6 DM controlled P HD MWF, esa, Fe, vit D, access    LOS: 24 days   Taleyah Hillman L 07/11/2015,9:30 AM

## 2015-07-11 NOTE — Progress Notes (Signed)
CSW spoke via telephone with daughter David Ford  [id number]. She stated that she and her brother David Ford [phone] work together in regards to medical care for patient. His actual HCPOA is in [place]. Daughter stated that she was told by a Physical Therapist that her father would benefit from CIR placement. Discussed CIR vs SNF; patient currently not recommended for CIR per PT.  She is in agreement with SNF but patient is not stable for d/c and thus bed offers will change. He is currently receiving new dialysis and has not been determined to be end stage renal yet.  CSW services will continue to monitor and assist with d/c disposition for patient.  David Ford. David Ford, [place]

## 2015-07-12 DIAGNOSIS — N186 End stage renal disease: Secondary | ICD-10-CM

## 2015-07-12 LAB — TYPE AND SCREEN
ABO/RH(D): O POS
Antibody Screen: NEGATIVE
UNIT DIVISION: 0

## 2015-07-12 LAB — CBC
HEMATOCRIT: 22.6 % — AB (ref 39.0–52.0)
Hemoglobin: 7.5 g/dL — ABNORMAL LOW (ref 13.0–17.0)
MCH: 27.6 pg (ref 26.0–34.0)
MCHC: 33.2 g/dL (ref 30.0–36.0)
MCV: 83.1 fL (ref 78.0–100.0)
Platelets: 349 10*3/uL (ref 150–400)
RBC: 2.72 MIL/uL — ABNORMAL LOW (ref 4.22–5.81)
RDW: 14.4 % (ref 11.5–15.5)
WBC: 7.5 10*3/uL (ref 4.0–10.5)

## 2015-07-12 LAB — GLUCOSE, CAPILLARY
GLUCOSE-CAPILLARY: 285 mg/dL — AB (ref 65–99)
GLUCOSE-CAPILLARY: 123 mg/dL — AB (ref 65–99)
Glucose-Capillary: 131 mg/dL — ABNORMAL HIGH (ref 65–99)

## 2015-07-12 NOTE — Progress Notes (Signed)
[facility] PROGRESS NOTE  David Ford [id number] DOB: 02-08-1941 DOA: 06/17/2015 PCP: No primary care provider on file.  Assessment/Plan: 75 y.o. male with DM, HTN admitted after a fall from his couch and being on the floor for about 3 days. His found by a friend who then called EMS.  -admitted with renal failure, noted to have rhabdomyolysis, glucose of 454, acute renal failure with a creatinine of 4 and a potassium of 6.0. WBC count was also elevated at 16.2. He was noted to have gangrenous toes on his left foot. He underwent amputation on 1/13. He is still requiring dialysis and the renal team is still trying to determine if his renal function will recuperated. He received a temp right IJ tunneled dialysis cath on 1/16. Renal team attempting to arrange outpatient dialysis for him. Vascular surgery plans for AVG next week.   1. Diabetic foot infection.  -Pt underwent amputation of left 1st and 2nd ray on 1/13. He is completed IV vancomycin and Zosyn   2. Acute renal failure. Severe Rhabdomyolysis. AKI likely due to Rhabdomyolysis and sepsis from gangrenous toes -Now ESRD. Unfortunately renal function did not recover, and Pt is started on dialysis treatments when first admitted and continues to require dialysis. Nephrology team managing. Scheduled for access placement next week per [facility]  3. Episode of upper GI bleed  -secondary to NG tube insertion and gastritis - was started on BID Protonix which I have decreased to daily  4. AoCD. Renal.  Initially transfused 2 U PRBC on admission. No s/s of active bleeding. -CBC on 07/12/2015 showing Hg of 7.5   5. Partial small bowel obstruction  Resolved with conservative management  6. Atrial fibrillation with RVR  -Occurred on hemodialysis on 12/1. 2-D echo revealed EF of 50-55% and grade 1 diastolic dysfunction. TSH was normal at 0.653 on 12/31.  -No further episodes noted since then- will hold off on anticoagulation   7. DM2   -Uncontrolled. HgA1c-16.2 -Continue Lantus 15 units subcutaneous daily and 6 units of NovoLog for mealtime coverage  8. Essential HTN. coreg and Hydralazine  9. Fever on 1/19. No leukocytosis. CXR: no clear infiltrates. Blood culture-pend  -recently completed IV atx. c diff was neg (07/05/15). Cont monitor   Code Status: Full Family Communication:  Disposition Plan: Plan for vascular access   Consultants:  Nephrology  IR  Ortho    Procedures:  S/p left 1st and 2nd ray on 1/13   IJ tunneled dialysis cath on 1/16  Antibiotics:  vanc 12/28-1/4  Zosyn 12/28-1/4 then 1/01/19/11  (indicate start date, and stop date if known)  HPI/Subjective: Alert. No distress. Denies SOB  Objective: Filed Vitals:   07/12/15 0625 07/12/15 0923  BP: 146/79 136/59  Pulse: 78 76  Temp: 99.5 F (37.5 C) 98.4 F (36.9 C)  Resp: 18 16    Intake/Output Summary (Last 24 hours) at 07/12/15 1652 Last data filed at 07/12/15 1506  Gross per 24 hour  Intake    745 ml  Output      0 ml  Net    745 ml   Filed Weights   07/10/15 0812 07/10/15 1230 07/10/15 2015  Weight: 98 kg (216 lb 0.8 oz) 95.7 kg (210 lb 15.7 oz) 96 kg (211 lb 10.3 oz)    Exam:   General:  alert  Cardiovascular:  S1,s2 rrr  Respiratory: few crackles LL  Abdomen: soft, nt,nd   Musculoskeletal: no leg edema    Data Reviewed: Basic Metabolic Panel:  Recent Labs Lab 07/06/15 0606 07/06/15 1414 07/07/15 0801 07/08/15 0700 07/08/15 0808 07/10/15 0600 07/10/15 0830  NA 130* 130* 133* 129*  --  127* 128*  K 3.6 3.5 3.6 4.0  --  4.4 4.4  CL 96* 96* 96* 91*  --  90* 92*  CO2 23 23 23 25   --  23 25  GLUCOSE 148* 164* 109* 158*  --  168* 212*  BUN 47* 48* 28* 41*  --  38* 37*  CREATININE 10.18* 10.97* 7.17* 8.74*  --  8.39* 8.39*  CALCIUM 7.5* 7.4* 7.1* 7.6* 7.5* 7.8* 7.8*  PHOS 5.8* 6.0* 4.0 4.2  --   --  4.6   Liver Function Tests:  Recent Labs Lab 07/06/15 0606 07/06/15 1414 07/07/15 0801  07/08/15 0700 07/10/15 0830  ALBUMIN 1.5* 1.6* 1.6* 1.6* 1.7*   No results for input(s): LIPASE, AMYLASE in the last 168 hours. No results for input(s): AMMONIA in the last 168 hours. CBC:  Recent Labs Lab 07/08/15 0700 07/10/15 0600 07/10/15 0830 07/11/15 0449 07/12/15 0730  WBC 5.4 6.1 6.0 5.6 7.5  HGB 7.2* 7.2* 7.2* 6.6* 7.5*  HCT 22.1* 21.8* 21.9* 19.9* 22.6*  MCV 85.0 84.5 83.6 82.9 83.1  PLT 272 304 311 328 349   Cardiac Enzymes: No results for input(s): CKTOTAL, CKMB, CKMBINDEX, TROPONINI in the last 168 hours. BNP (last 3 results) No results for input(s): BNP in the last 8760 hours.  ProBNP (last 3 results) No results for input(s): PROBNP in the last 8760 hours.  CBG:  Recent Labs Lab 07/11/15 1214 07/11/15 1701 07/11/15 2117 07/12/15 0819 07/12/15 1206  GLUCAP 145* 120* 149* 131* 285*    Recent Results (from the past 240 hour(s))  Surgical pcr screen     Status: None   Collection Time: 07/03/15  2:00 PM  Result Value Ref Range Status   MRSA, PCR NEGATIVE NEGATIVE Final   Staphylococcus aureus NEGATIVE NEGATIVE Final    Comment:        The Xpert SA Assay (FDA approved for NASAL specimens in patients over 21 years of age), is one component of a comprehensive surveillance program.  Test performance has been validated by [facility] for patients greater than or equal to 25 year old. It is not intended to diagnose infection nor to guide or monitor treatment.   C difficile quick scan w PCR reflex     Status: None   Collection Time: 07/05/15  9:47 AM  Result Value Ref Range Status   C Diff antigen NEGATIVE NEGATIVE Final   C Diff toxin NEGATIVE NEGATIVE Final   C Diff interpretation Negative for toxigenic C. difficile  Final  Culture, blood (routine x 2)     Status: None (Preliminary result)   Collection Time: 07/10/15  4:15 PM  Result Value Ref Range Status   Specimen Description BLOOD LEFT HAND  Final   Special Requests IN PEDIATRIC BOTTLE  3CC  Final   Culture NO GROWTH 2 DAYS  Final   Report Status PENDING  Incomplete  Culture, blood (routine x 2)     Status: None (Preliminary result)   Collection Time: 07/10/15  4:25 PM  Result Value Ref Range Status   Specimen Description BLOOD LEFT HAND  Final   Special Requests IN PEDIATRIC BOTTLE 2CC  Final   Culture NO GROWTH 2 DAYS  Final   Report Status PENDING  Incomplete     Studies: No results found.  Scheduled Meds: . sodium chloride  Intravenous Once  . antiseptic oral rinse  7 mL Mouth Rinse q12n4p  . brimonidine  1 drop Both Eyes BID  . calcitRIOL  0.5 mcg Oral Q M,W,F-HD  . carvedilol  3.125 mg Oral BID WC  . cyclobenzaprine  7.5 mg Oral TID  . darbepoetin (ARANESP) injection - DIALYSIS  200 mcg Intravenous Q Fri-HD  . diclofenac sodium  2 g Topical QID  . docusate sodium  200 mg Oral Daily  . feeding supplement (NEPRO CARB STEADY)  237 mL Oral BID BM  . ferric gluconate (FERRLECIT/NULECIT) IV  125 mg Intravenous Q M,W,F-HD  . fluticasone  1 spray Each Nare Daily  . gabapentin  300 mg Oral TID  . insulin aspart  0-15 Units Subcutaneous TID WC  . insulin aspart  0-5 Units Subcutaneous QHS  . insulin aspart  6 Units Subcutaneous TID WC  . insulin glargine  15 Units Subcutaneous Daily  . latanoprost  1 drop Both Eyes QHS  . multivitamin  1 tablet Oral QHS  . pantoprazole  40 mg Oral Daily  . polyethylene glycol  17 g Oral Daily  . rosuvastatin  20 mg Oral Daily  . senna-docusate  1 tablet Oral BID  . sevelamer carbonate  1,600 mg Oral TID WC  . sodium chloride  3 mL Intravenous Q12H   Continuous Infusions: . sodium chloride 10 mL/hr at 07/03/15 1819  . sodium chloride 10 mL/hr at 07/03/15 2207    Principal Problem:   Diabetic foot infection ([place]) Active Problems:   Hypertension   Leukocytosis   Elevated troponin   Hyponatremia   Hypoalbuminemia   Hyperkalemia   Rhabdomyolysis   Metabolic acidosis   Acute renal failure (ARF) ([place])   DM type 2  causing renal disease ([place])   Dehydration   Debility   Ileus (HCC)   SBO (small bowel obstruction) ([place])   Gastrointestinal hemorrhage with hematemesis   Atrial fibrillation ([place])    Time spent: 25 minutes     Kelvin Cellar  [facility] Pager [phone]. If 7PM-7AM, please contact night-coverage at [email or web address], password [facility] 07/12/2015, 4:52 PM  LOS: 25 days

## 2015-07-12 NOTE — Progress Notes (Signed)
Subjective: Interval History: has no complaint .  Objective: Vital signs in last 24 hours: Temp:  [99.4 F (37.4 C)-101.3 F (38.5 C)] 99.5 F (37.5 C) (01/22 0625) Pulse Rate:  [78-89] 78 (01/22 0625) Resp:  [18-20] 18 (01/22 0625) BP: (146-154)/(56-79) 146/79 mmHg (01/22 0625) SpO2:  [98 %-100 %] 99 % (01/22 0625) Weight change:   Intake/Output from previous day: 01/21 0701 - 01/22 0700 In: 1255 [P.O.:780; Blood:365; IV Piggyback:110] Out: 0  Intake/Output this shift:    General appearance: alert, cooperative, no distress and moderately obese Resp: diminished breath sounds bilaterally Chest wall: RIJ PC Cardio: S1, S2 normal and systolic murmur: holosystolic 2/6, blowing at apex GI: pos bs, liver down 4 cm , obese,soft Extremities: Dressing L foot  Lab Results:  Recent Labs  07/11/15 0449 07/12/15 0730  WBC 5.6 7.5  HGB 6.6* 7.5*  HCT 19.9* 22.6*  PLT 328 349   BMET:  Recent Labs  07/10/15 0600 07/10/15 0830  NA 127* 128*  K 4.4 4.4  CL 90* 92*  CO2 23 25  GLUCOSE 168* 212*  BUN 38* 37*  CREATININE 8.39* 8.39*  CALCIUM 7.8* 7.8*   No results for input(s): PTH in the last 72 hours. Iron Studies:  Recent Labs  07/10/15 0845  IRON 9*  TIBC 167*    Studies/Results: Dg Chest 2 View  07/10/2015  CLINICAL DATA:  Fever, abdominal bloating. EXAM: CHEST  2 VIEW COMPARISON:  06/19/2015 FINDINGS: Right dialysis catheter in place with the tip in the SVC. Heart is mildly enlarged. No confluent airspace opacities, effusions or edema. No acute bony abnormality. IMPRESSION: Borderline cardiomegaly.  No active disease. Electronically Signed   By: Rolm Baptise M.D.   On: 07/10/2015 14:50   Dg Abd 1 View  07/10/2015  CLINICAL DATA:  Fever, abdominal bloating. EXAM: ABDOMEN - 1 VIEW COMPARISON:  06/23/2015 FINDINGS: Gas throughout nondistended stomach, large and small bowel. No organomegaly or free air. No suspicious calcification. Radiation seeds in the region of the  prostate. IMPRESSION: No acute findings. Gas throughout nondistended large and small bowel. Electronically Signed   By: Rolm Baptise M.D.   On: 07/10/2015 14:51    I have reviewed the patient's current medications.  Assessment/Plan: 1 CKD/AKI now approaching ESRD . For HD tomorrow, access this week. 2 HTN controlled 3 Anemai stable on Fe, esa 4 hPTH vit D 5 DM controlled 6 PVD per ortho 7 Debill P HD, esa, Fe, Vit D, access    LOS: 25 days   Avyanna Spada L 07/12/2015,8:46 AM

## 2015-07-13 DIAGNOSIS — Z992 Dependence on renal dialysis: Secondary | ICD-10-CM

## 2015-07-13 LAB — RENAL FUNCTION PANEL
ALBUMIN: 1.5 g/dL — AB (ref 3.5–5.0)
Anion gap: 11 (ref 5–15)
BUN: 42 mg/dL — AB (ref 6–20)
CALCIUM: 7.7 mg/dL — AB (ref 8.9–10.3)
CO2: 24 mmol/L (ref 22–32)
CREATININE: 9.1 mg/dL — AB (ref 0.61–1.24)
Chloride: 94 mmol/L — ABNORMAL LOW (ref 101–111)
GFR, EST AFRICAN AMERICAN: 6 mL/min — AB (ref >60)
GFR, EST NON AFRICAN AMERICAN: 5 mL/min — AB (ref >60)
Glucose, Bld: 73 mg/dL (ref 65–99)
PHOSPHORUS: 4.5 mg/dL (ref 2.5–4.6)
Potassium: 4.5 mmol/L (ref 3.5–5.1)
Sodium: 129 mmol/L — ABNORMAL LOW (ref 135–145)

## 2015-07-13 LAB — CBC
HCT: 22.7 % — ABNORMAL LOW (ref 39.0–52.0)
Hemoglobin: 7.6 g/dL — ABNORMAL LOW (ref 13.0–17.0)
MCH: 27.2 pg (ref 26.0–34.0)
MCHC: 33.5 g/dL (ref 30.0–36.0)
MCV: 81.4 fL (ref 78.0–100.0)
PLATELETS: 356 10*3/uL (ref 150–400)
RBC: 2.79 MIL/uL — AB (ref 4.22–5.81)
RDW: 14.2 % (ref 11.5–15.5)
WBC: 7.9 10*3/uL (ref 4.0–10.5)

## 2015-07-13 LAB — GLUCOSE, CAPILLARY
GLUCOSE-CAPILLARY: 132 mg/dL — AB (ref 65–99)
GLUCOSE-CAPILLARY: 134 mg/dL — AB (ref 65–99)
GLUCOSE-CAPILLARY: 106 mg/dL — AB (ref 65–99)

## 2015-07-13 MED ORDER — PENTAFLUOROPROP-TETRAFLUOROETH EX AERO: 1.0000 "application " | INHALATION_SPRAY | CUTANEOUS | Status: DC | PRN

## 2015-07-13 MED ORDER — CALCITRIOL 0.5 MCG PO CAPS
ORAL_CAPSULE | ORAL | Status: AC
  Filled 2015-07-13: qty 1

## 2015-07-13 MED ORDER — SODIUM CHLORIDE 0.9 % IV SOLN: 100.0000 mL | INTRAVENOUS | Status: DC | PRN

## 2015-07-13 MED ORDER — HEPARIN SODIUM (PORCINE) 1000 UNIT/ML DIALYSIS
100.0000 [IU]/kg | INTRAMUSCULAR | Status: DC | PRN
  Filled 2015-07-13: qty 10

## 2015-07-13 MED ORDER — ALTEPLASE 2 MG IJ SOLR
2.0000 mg | Freq: Once | INTRAMUSCULAR | Status: DC | PRN
  Filled 2015-07-13: qty 2

## 2015-07-13 MED ORDER — HEPARIN SODIUM (PORCINE) 1000 UNIT/ML DIALYSIS: 1000.0000 [IU] | INTRAMUSCULAR | Status: DC | PRN

## 2015-07-13 MED ORDER — LIDOCAINE-PRILOCAINE 2.5-2.5 % EX CREA
1.0000 "application " | TOPICAL_CREAM | CUTANEOUS | Status: DC | PRN
  Filled 2015-07-13: qty 5

## 2015-07-13 MED ORDER — LIDOCAINE HCL (PF) 1 % IJ SOLN: 5.0000 mL | INTRAMUSCULAR | Status: DC | PRN

## 2015-07-13 NOTE — Procedures (Signed)
Patient was seen on dialysis and the procedure was supervised. BFR 400 Via RIJ tdc  BP is 145/65.  Patient appears to be tolerating treatment well

## 2015-07-13 NOTE — Progress Notes (Signed)
[facility] PROGRESS NOTE  David Ford [id number] DOB: 08/13/1940 DOA: 06/17/2015 PCP: No primary care provider on file.  Assessment/Plan: 75 y.o. male with DM, HTN admitted after a fall from his couch and being on the floor for about 3 days. His found by a friend who then called EMS.  -admitted with renal failure, noted to have rhabdomyolysis, glucose of 454, acute renal failure with a creatinine of 4 and a potassium of 6.0. WBC count was also elevated at 16.2. He was noted to have gangrenous toes on his left foot. He underwent amputation on 1/13. He is still requiring dialysis and the renal team is still trying to determine if his renal function will recuperated. He received a temp right IJ tunneled dialysis cath on 1/16. Renal team attempting to arrange outpatient dialysis for him. Vascular surgery plans for AVG next week.   1. Diabetic foot infection.  -Pt underwent amputation of left 1st and 2nd ray on 1/13. He is completed IV vancomycin and Zosyn -Wound care following.  -I evaluated wound does not appear to have evidence of active infection   -Will follow up with ortho at the office  2. Acute renal failure. Severe Rhabdomyolysis. AKI likely due to Rhabdomyolysis and sepsis from gangrenous toes -Now ESRD. Unfortunately renal function did not recover, and Pt is started on dialysis treatments when first admitted and continues to require dialysis. Nephrology team managing.  -Plan for vascular access this week.   3. Episode of upper GI bleed  -secondary to NG tube insertion and gastritis - was started on BID Protonix which I have decreased to daily  4. AoCD. Renal.  Initially transfused 2 U PRBC on admission. No s/s of active bleeding. -CBC on 07/12/2015 showing Hg of 7.5   5. Partial small bowel obstruction  Resolved with conservative management  6. Atrial fibrillation with RVR  -Occurred on hemodialysis on 12/1. 2-D echo revealed EF of 50-55% and grade 1 diastolic  dysfunction. TSH was normal at 0.653 on 12/31.  -No further episodes noted since then- will hold off on anticoagulation   7. DM2  -Uncontrolled. HgA1c-16.2 -Continue Lantus 15 units subcutaneous daily and 6 units of NovoLog for mealtime coverage  8. Essential HTN. coreg and Hydralazine  9. Fever on 1/19. No leukocytosis. CXR: no clear infiltrates. Blood culture-pend  -recently completed IV atx. c diff was neg (07/05/15). Cont monitor   Code Status: Full Family Communication:  Disposition Plan: Plan for vascular access   Consultants:  Nephrology  IR  Ortho    Procedures:  S/p left 1st and 2nd ray on 1/13   IJ tunneled dialysis cath on 1/16  Antibiotics:  vanc 12/28-1/4  Zosyn 12/28-1/4 then 1/01/19/11  (indicate start date, and stop date if known)  HPI/Subjective: Alert. No distress. Denies SOB  Objective: Filed Vitals:   07/13/15 1213 07/13/15 1700  BP: 147/64 136/45  Pulse: 98 87  Temp: 99 F (37.2 C) 100.3 F (37.9 C)  Resp:  18    Intake/Output Summary (Last 24 hours) at 07/13/15 1816 Last data filed at 07/13/15 1400  Gross per 24 hour  Intake    120 ml  Output   2500 ml  Net  -2380 ml   Filed Weights   07/10/15 2015 07/13/15 0729 07/13/15 1150  Weight: 96 kg (211 lb 10.3 oz) 99.7 kg (219 lb 12.8 oz) 96.1 kg (211 lb 13.8 oz)    Exam:   General:  alert  Cardiovascular:  S1,s2 rrr  Respiratory: few crackles  LL  Abdomen: soft, nt,nd   Musculoskeletal: no leg edema    Skin: S/p amputation of 1st and 2nd ray, no evidence of infection  Data Reviewed: Basic Metabolic Panel:  Recent Labs Lab 07/07/15 0801 07/08/15 0700 07/08/15 0808 07/10/15 0600 07/10/15 0830 07/13/15 0651  NA 133* 129*  --  127* 128* 129*  K 3.6 4.0  --  4.4 4.4 4.5  CL 96* 91*  --  90* 92* 94*  CO2 23 25  --  23 25 24   GLUCOSE 109* 158*  --  168* 212* 73  BUN 28* 41*  --  38* 37* 42*  CREATININE 7.17* 8.74*  --  8.39* 8.39* 9.10*  CALCIUM 7.1* 7.6* 7.5* 7.8*  7.8* 7.7*  PHOS 4.0 4.2  --   --  4.6 4.5   Liver Function Tests:  Recent Labs Lab 07/07/15 0801 07/08/15 0700 07/10/15 0830 07/13/15 0651  ALBUMIN 1.6* 1.6* 1.7* 1.5*   No results for input(s): LIPASE, AMYLASE in the last 168 hours. No results for input(s): AMMONIA in the last 168 hours. CBC:  Recent Labs Lab 07/10/15 0600 07/10/15 0830 07/11/15 0449 07/12/15 0730 07/13/15 0631  WBC 6.1 6.0 5.6 7.5 7.9  HGB 7.2* 7.2* 6.6* 7.5* 7.6*  HCT 21.8* 21.9* 19.9* 22.6* 22.7*  MCV 84.5 83.6 82.9 83.1 81.4  PLT 304 311 328 349 356   Cardiac Enzymes: No results for input(s): CKTOTAL, CKMB, CKMBINDEX, TROPONINI in the last 168 hours. BNP (last 3 results) No results for input(s): BNP in the last 8760 hours.  ProBNP (last 3 results) No results for input(s): PROBNP in the last 8760 hours.  CBG:  Recent Labs Lab 07/12/15 0819 07/12/15 1206 07/12/15 2024 07/13/15 1212 07/13/15 1656  GLUCAP 131* 285* 123* 132* 134*    Recent Results (from the past 240 hour(s))  C difficile quick scan w PCR reflex     Status: None   Collection Time: 07/05/15  9:47 AM  Result Value Ref Range Status   C Diff antigen NEGATIVE NEGATIVE Final   C Diff toxin NEGATIVE NEGATIVE Final   C Diff interpretation Negative for toxigenic C. difficile  Final  Culture, blood (routine x 2)     Status: None (Preliminary result)   Collection Time: 07/10/15  4:15 PM  Result Value Ref Range Status   Specimen Description BLOOD LEFT HAND  Final   Special Requests IN PEDIATRIC BOTTLE 3CC  Final   Culture NO GROWTH 3 DAYS  Final   Report Status PENDING  Incomplete  Culture, blood (routine x 2)     Status: None (Preliminary result)   Collection Time: 07/10/15  4:25 PM  Result Value Ref Range Status   Specimen Description BLOOD LEFT HAND  Final   Special Requests IN PEDIATRIC BOTTLE 2CC  Final   Culture NO GROWTH 3 DAYS  Final   Report Status PENDING  Incomplete     Studies: No results found.  Scheduled  Meds: . sodium chloride   Intravenous Once  . antiseptic oral rinse  7 mL Mouth Rinse q12n4p  . brimonidine  1 drop Both Eyes BID  . calcitRIOL  0.5 mcg Oral Q M,W,F-HD  . carvedilol  3.125 mg Oral BID WC  . cyclobenzaprine  7.5 mg Oral TID  . darbepoetin (ARANESP) injection - DIALYSIS  200 mcg Intravenous Q Fri-HD  . diclofenac sodium  2 g Topical QID  . docusate sodium  200 mg Oral Daily  . feeding supplement (NEPRO CARB STEADY)  237 mL Oral BID BM  . ferric gluconate (FERRLECIT/NULECIT) IV  125 mg Intravenous Q M,W,F-HD  . fluticasone  1 spray Each Nare Daily  . gabapentin  300 mg Oral TID  . insulin aspart  0-15 Units Subcutaneous TID WC  . insulin aspart  0-5 Units Subcutaneous QHS  . insulin aspart  6 Units Subcutaneous TID WC  . insulin glargine  15 Units Subcutaneous Daily  . latanoprost  1 drop Both Eyes QHS  . multivitamin  1 tablet Oral QHS  . pantoprazole  40 mg Oral Daily  . polyethylene glycol  17 g Oral Daily  . rosuvastatin  20 mg Oral Daily  . senna-docusate  1 tablet Oral BID  . sevelamer carbonate  1,600 mg Oral TID WC  . sodium chloride  3 mL Intravenous Q12H   Continuous Infusions: . sodium chloride 10 mL/hr at 07/03/15 1819  . sodium chloride 10 mL/hr at 07/03/15 2207    Principal Problem:   Diabetic foot infection ([place]) Active Problems:   Hypertension   Leukocytosis   Elevated troponin   Hyponatremia   Hypoalbuminemia   Hyperkalemia   Rhabdomyolysis   Metabolic acidosis   Acute renal failure (ARF) ([place])   DM type 2 causing renal disease ([place])   Dehydration   Debility   Ileus ([facility])   SBO (small bowel obstruction) ([facility])   Gastrointestinal hemorrhage with hematemesis   Atrial fibrillation ([place])    Time spent: 15 minutes     Kelvin Cellar  [facility] Pager [phone]. If 7PM-7AM, please contact night-coverage at [email or web address], password [facility] 07/13/2015, 6:16 PM  LOS: 26 days

## 2015-07-13 NOTE — Progress Notes (Signed)
Patient ID: Eliga Shalom, male   DOB: 18-Feb-1941, 75 y.o.   MRN: [id number] S:No new complaints and seen on HD O:BP 145/65 mmHg  Pulse 87  Temp(Src) 98.2 F (36.8 C) (Oral)  Resp 16  Ht 6\' 2"  (1.88 m)  Wt 99.7 kg (219 lb 12.8 oz)  BMI 28.21 kg/m2  SpO2 98%  Intake/Output Summary (Last 24 hours) at 07/13/15 0903 Last data filed at 07/13/15 [id number]  Gross per 24 hour  Intake    270 ml  Output      0 ml  Net    270 ml   Intake/Output: I/O last 3 completed shifts: In: [id number] [P.O.:330; Blood:335; IV Piggyback:110] Out: 0   Intake/Output this shift:    Weight change:  Gen:WD WN AAM in NAD CVS:no rub Resp:cta [id number] Ext:no edema   Recent Labs Lab 07/06/15 1414 07/07/15 0801 07/08/15 0700 07/08/15 0808 07/10/15 0600 07/10/15 0830 07/13/15 0651  NA 130* 133* 129*  --  127* 128* 129*  K 3.5 3.6 4.0  --  4.4 4.4 4.5  CL 96* 96* 91*  --  90* 92* 94*  CO2 23 23 25   --  23 25 24   GLUCOSE 164* 109* 158*  --  168* 212* 73  BUN 48* 28* 41*  --  38* 37* 42*  CREATININE 10.97* 7.17* 8.74*  --  8.39* 8.39* 9.10*  ALBUMIN 1.6* 1.6* 1.6*  --   --  1.7* 1.5*  CALCIUM 7.4* 7.1* 7.6* 7.5* 7.8* 7.8* 7.7*  PHOS 6.0* 4.0 4.2  --   --  4.6 4.5   Liver Function Tests:  Recent Labs Lab 07/08/15 0700 07/10/15 0830 07/13/15 0651  ALBUMIN 1.6* 1.7* 1.5*   No results for input(s): LIPASE, AMYLASE in the last 168 hours. No results for input(s): AMMONIA in the last 168 hours. CBC:  Recent Labs Lab 07/10/15 0600 07/10/15 0830 07/11/15 0449 07/12/15 0730 07/13/15 0631  WBC 6.1 6.0 5.6 7.5 7.9  HGB 7.2* 7.2* 6.6* 7.5* 7.6*  HCT 21.8* 21.9* 19.9* 22.6* 22.7*  MCV 84.5 83.6 82.9 83.1 81.4  PLT 304 311 328 349 356   Cardiac Enzymes: No results for input(s): CKTOTAL, CKMB, CKMBINDEX, TROPONINI in the last 168 hours. CBG:  Recent Labs Lab 07/11/15 1701 07/11/15 2117 07/12/15 0819 07/12/15 1206 07/12/15 2024  GLUCAP 120* 149* 131* 285* 123*    Iron Studies: No results for  input(s): IRON, TIBC, TRANSFERRIN, FERRITIN in the last 72 hours. Studies/Results: No results found. . sodium chloride   Intravenous Once  . antiseptic oral rinse  7 mL Mouth Rinse q12n4p  . brimonidine  1 drop Both Eyes BID  . calcitRIOL  0.5 mcg Oral Q M,W,F-HD  . carvedilol  3.125 mg Oral BID WC  . cyclobenzaprine  7.5 mg Oral TID  . darbepoetin (ARANESP) injection - DIALYSIS  200 mcg Intravenous Q Fri-HD  . diclofenac sodium  2 g Topical QID  . docusate sodium  200 mg Oral Daily  . feeding supplement (NEPRO CARB STEADY)  237 mL Oral BID BM  . ferric gluconate (FERRLECIT/NULECIT) IV  125 mg Intravenous Q M,W,F-HD  . fluticasone  1 spray Each Nare Daily  . gabapentin  300 mg Oral TID  . insulin aspart  0-15 Units Subcutaneous TID WC  . insulin aspart  0-5 Units Subcutaneous QHS  . insulin aspart  6 Units Subcutaneous TID WC  . insulin glargine  15 Units Subcutaneous Daily  . latanoprost  1 drop Both Eyes QHS  .  multivitamin  1 tablet Oral QHS  . pantoprazole  40 mg Oral Daily  . polyethylene glycol  17 g Oral Daily  . rosuvastatin  20 mg Oral Daily  . senna-docusate  1 tablet Oral BID  . sevelamer carbonate  1,600 mg Oral TID WC  . sodium chloride  3 mL Intravenous Q12H    BMET    Component Value Date/Time   NA 129* 07/13/2015 0651   K 4.5 07/13/2015 0651   CL 94* 07/13/2015 0651   CO2 24 07/13/2015 0651   GLUCOSE 73 07/13/2015 0651   BUN 42* 07/13/2015 0651   CREATININE 9.10* 07/13/2015 0651   CALCIUM 7.7* 07/13/2015 0651   CALCIUM 7.5* 07/08/2015 0808   GFRNONAA 5* 07/13/2015 0651   GFRAA 6* 07/13/2015 0651   CBC    Component Value Date/Time   WBC 7.9 07/13/2015 0631   RBC 2.79* 07/13/2015 0631   HGB 7.6* 07/13/2015 0631   HCT 22.7* 07/13/2015 0631   PLT 356 07/13/2015 0631   MCV 81.4 07/13/2015 0631   MCH 27.2 07/13/2015 0631   MCHC 33.5 07/13/2015 0631   RDW 14.2 07/13/2015 0631   LYMPHSABS 1.9 06/25/2015 0248   MONOABS 1.4* 06/25/2015 0248   EOSABS  0.2 06/25/2015 0248   BASOSABS 0.2* 06/25/2015 0248    Assessment/Plan: 1. AKI/CKD now approaching ESRD . First HD on 06/20/15 and has remained anuric.  Multiple renal insults including ischemic ATN in setting of partial SBO, rhabdo, and underlying CKD due to DM and HTN.  For permanent vascular access this week. 2. HTN controlled 3. Anemai stable on Fe, esa 4. SHPTH follow Ca/Phos/iPTH vit D 5. DM controlled 6. Diabetic foot ulcer, PVD s/p 1st and 2nd ray amputation on 07/03/15 per ortho 7. Debilitation- await SNF placement  [place]

## 2015-07-13 NOTE — Consult Note (Addendum)
WOC consult requested for post-op wound care to left foot.  Dr Sharol Given performed surgery on 1/13 and provided topical treatment orders for nursing staff of dry gauze and acewrap to be changed Q day.  Please refer to ortho service for further questions regarding plan of care. Please re-consult if further assistance is needed.  Thank-you,  Julien Girt MSN, [place], [place], [place], [place]

## 2015-07-14 ENCOUNTER — Inpatient Hospital Stay (HOSPITAL_COMMUNITY): Payer: Medicare HMO | Admitting: Anesthesiology

## 2015-07-14 ENCOUNTER — Other Ambulatory Visit: Payer: Self-pay | Admitting: *Deleted

## 2015-07-14 ENCOUNTER — Encounter (HOSPITAL_COMMUNITY): Payer: Self-pay | Admitting: Anesthesiology

## 2015-07-14 ENCOUNTER — Encounter (HOSPITAL_COMMUNITY)
Admission: EM | Disposition: A | Discharge status: Skilled Nursing Facility | Payer: Self-pay | Source: Home / Self Care | Attending: Internal Medicine

## 2015-07-14 DIAGNOSIS — N185 Chronic kidney disease, stage 5: Secondary | ICD-10-CM

## 2015-07-14 DIAGNOSIS — Z4931 Encounter for adequacy testing for hemodialysis: Secondary | ICD-10-CM

## 2015-07-14 DIAGNOSIS — N186 End stage renal disease: Secondary | ICD-10-CM

## 2015-07-14 HISTORY — PX: BASCILIC VEIN TRANSPOSITION: SHX5742

## 2015-07-14 LAB — BASIC METABOLIC PANEL
Anion gap: 12 (ref 5–15)
BUN: 29 mg/dL — ABNORMAL HIGH (ref 6–20)
CHLORIDE: 95 mmol/L — AB (ref 101–111)
CO2: 26 mmol/L (ref 22–32)
Calcium: 7.8 mg/dL — ABNORMAL LOW (ref 8.9–10.3)
Creatinine, Ser: 6.95 mg/dL — ABNORMAL HIGH (ref 0.61–1.24)
GFR calc non Af Amer: 7 mL/min — ABNORMAL LOW (ref >60)
GFR, EST AFRICAN AMERICAN: 8 mL/min — AB (ref >60)
Glucose, Bld: 102 mg/dL — ABNORMAL HIGH (ref 65–99)
POTASSIUM: 4.2 mmol/L (ref 3.5–5.1)
SODIUM: 133 mmol/L — AB (ref 135–145)

## 2015-07-14 LAB — GLUCOSE, CAPILLARY
GLUCOSE-CAPILLARY: 98 mg/dL (ref 65–99)
GLUCOSE-CAPILLARY: 123 mg/dL — AB (ref 65–99)
GLUCOSE-CAPILLARY: 178 mg/dL — AB (ref 65–99)
Glucose-Capillary: 224 mg/dL — ABNORMAL HIGH (ref 65–99)

## 2015-07-14 SURGERY — TRANSPOSITION, VEIN, BASILIC
Anesthesia: General | Site: Arm Upper | Laterality: Left

## 2015-07-14 MED ORDER — HEPARIN SODIUM (PORCINE) 1000 UNIT/ML IJ SOLN
INTRAMUSCULAR | Status: AC
  Filled 2015-07-14: qty 2

## 2015-07-14 MED ORDER — CEFAZOLIN SODIUM-DEXTROSE 2-3 GM-% IV SOLR
INTRAVENOUS | Status: AC
  Filled 2015-07-14: qty 50

## 2015-07-14 MED ORDER — PROTAMINE SULFATE 10 MG/ML IV SOLN
INTRAVENOUS | Status: AC
  Filled 2015-07-14: qty 5

## 2015-07-14 MED ORDER — PRO-STAT SUGAR FREE PO LIQD
30.0000 mL | Freq: Every day | ORAL | Status: DC
  Administered 2015-07-15: 30 mL via ORAL
  Filled 2015-07-14: qty 30

## 2015-07-14 MED ORDER — PROPOFOL 10 MG/ML IV BOLUS
INTRAVENOUS | Status: DC | PRN
  Administered 2015-07-14: 10 mg via INTRAVENOUS

## 2015-07-14 MED ORDER — LIDOCAINE HCL (CARDIAC) 20 MG/ML IV SOLN
INTRAVENOUS | Status: DC | PRN
  Administered 2015-07-14: 100 mg via INTRAVENOUS

## 2015-07-14 MED ORDER — EPHEDRINE SULFATE 50 MG/ML IJ SOLN
INTRAMUSCULAR | Status: AC
  Filled 2015-07-14: qty 1

## 2015-07-14 MED ORDER — SODIUM CHLORIDE 0.9 % IV SOLN
10.0000 mg | INTRAVENOUS | Status: DC | PRN
  Administered 2015-07-14: 10 ug/min via INTRAVENOUS

## 2015-07-14 MED ORDER — THROMBIN 20000 UNITS EX SOLR
CUTANEOUS | Status: AC
  Filled 2015-07-14: qty 20000

## 2015-07-14 MED ORDER — ONDANSETRON HCL 4 MG/2ML IJ SOLN
INTRAMUSCULAR | Status: AC
  Filled 2015-07-14: qty 4

## 2015-07-14 MED ORDER — CEFAZOLIN SODIUM-DEXTROSE 2-3 GM-% IV SOLR
INTRAVENOUS | Status: DC | PRN
  Administered 2015-07-14: 2 g via INTRAVENOUS

## 2015-07-14 MED ORDER — SODIUM CHLORIDE 0.9 % IV SOLN
INTRAVENOUS | Status: DC | PRN
  Administered 2015-07-14: 500 mL

## 2015-07-14 MED ORDER — 0.9 % SODIUM CHLORIDE (POUR BTL) OPTIME
TOPICAL | Status: DC | PRN
  Administered 2015-07-14: 1000 mL

## 2015-07-14 MED ORDER — ARTIFICIAL TEARS OP OINT
TOPICAL_OINTMENT | OPHTHALMIC | Status: AC
  Filled 2015-07-14: qty 3.5

## 2015-07-14 MED ORDER — FENTANYL CITRATE (PF) 100 MCG/2ML IJ SOLN
INTRAMUSCULAR | Status: DC | PRN
  Administered 2015-07-14 (×3): 50 ug via INTRAVENOUS

## 2015-07-14 MED ORDER — SODIUM CHLORIDE 0.9 % IV SOLN
INTRAVENOUS | Status: DC
  Administered 2015-07-14: 09:00:00 via INTRAVENOUS

## 2015-07-14 MED ORDER — LIDOCAINE HCL (PF) 1 % IJ SOLN
INTRAMUSCULAR | Status: AC
  Filled 2015-07-14: qty 30

## 2015-07-14 MED ORDER — MIDAZOLAM HCL 2 MG/2ML IJ SOLN
INTRAMUSCULAR | Status: AC
  Filled 2015-07-14: qty 2

## 2015-07-14 MED ORDER — PROPOFOL 10 MG/ML IV BOLUS
INTRAVENOUS | Status: AC
  Filled 2015-07-14: qty 40

## 2015-07-14 MED ORDER — LIDOCAINE HCL (CARDIAC) 20 MG/ML IV SOLN
INTRAVENOUS | Status: AC
  Filled 2015-07-14: qty 5

## 2015-07-14 MED ORDER — SUCCINYLCHOLINE CHLORIDE 20 MG/ML IJ SOLN
INTRAMUSCULAR | Status: AC
  Filled 2015-07-14: qty 1

## 2015-07-14 MED ORDER — NEPRO/CARBSTEADY PO LIQD: 237.0000 mL | Freq: Every day | ORAL | Status: DC

## 2015-07-14 MED ORDER — HEPARIN SODIUM (PORCINE) 1000 UNIT/ML IJ SOLN
INTRAMUSCULAR | Status: DC | PRN
  Administered 2015-07-14: 5000 [IU] via INTRAVENOUS

## 2015-07-14 MED ORDER — PROMETHAZINE HCL 25 MG/ML IJ SOLN: 6.2500 mg | INTRAMUSCULAR | Status: DC | PRN

## 2015-07-14 MED ORDER — MIDAZOLAM HCL 5 MG/5ML IJ SOLN
INTRAMUSCULAR | Status: DC | PRN
  Administered 2015-07-14: 2 mg via INTRAVENOUS

## 2015-07-14 MED ORDER — FENTANYL CITRATE (PF) 100 MCG/2ML IJ SOLN: 25.0000 ug | INTRAMUSCULAR | Status: DC | PRN

## 2015-07-14 MED ORDER — ONDANSETRON HCL 4 MG/2ML IJ SOLN
INTRAMUSCULAR | Status: DC | PRN
  Administered 2015-07-14: 4 mg via INTRAVENOUS

## 2015-07-14 MED ORDER — FENTANYL CITRATE (PF) 250 MCG/5ML IJ SOLN
INTRAMUSCULAR | Status: AC
  Filled 2015-07-14: qty 5

## 2015-07-14 SURGICAL SUPPLY — 31 items
CANISTER SUCTION 2500CC (MISCELLANEOUS) ×2 IMPLANT
CANNULA VESSEL 3MM 2 BLNT TIP (CANNULA) IMPLANT
CLIP TI MEDIUM 24 (CLIP) ×2 IMPLANT
CLIP TI WIDE RED SMALL 24 (CLIP) ×2 IMPLANT
COVER PROBE W GEL 5X96 (DRAPES) ×2 IMPLANT
DECANTER SPIKE VIAL GLASS SM (MISCELLANEOUS) ×2 IMPLANT
ELECT REM PT RETURN 9FT ADLT (ELECTROSURGICAL) ×2
ELECTRODE REM PT RTRN 9FT ADLT (ELECTROSURGICAL) ×1 IMPLANT
GEL ULTRASOUND 20GR AQUASONIC (MISCELLANEOUS) IMPLANT
GLOVE BIO SURGEON STRL SZ 6.5 (GLOVE) ×2 IMPLANT
GLOVE BIO SURGEON STRL SZ7.5 (GLOVE) ×2 IMPLANT
GOWN STRL REUS W/ TWL LRG LVL3 (GOWN DISPOSABLE) ×3 IMPLANT
GOWN STRL REUS W/TWL LRG LVL3 (GOWN DISPOSABLE) ×3
KIT BASIN OR (CUSTOM PROCEDURE TRAY) ×2 IMPLANT
KIT ROOM TURNOVER OR (KITS) ×2 IMPLANT
LIQUID BAND (GAUZE/BANDAGES/DRESSINGS) ×2 IMPLANT
LOOP VESSEL MINI RED (MISCELLANEOUS) IMPLANT
NS IRRIG 1000ML POUR BTL (IV SOLUTION) ×2 IMPLANT
PACK CV ACCESS (CUSTOM PROCEDURE TRAY) ×2 IMPLANT
PAD ARMBOARD 7.5X6 YLW CONV (MISCELLANEOUS) ×4 IMPLANT
SPONGE SURGIFOAM ABS GEL 100 (HEMOSTASIS) IMPLANT
SUT PROLENE 6 0 BV (SUTURE) ×2 IMPLANT
SUT PROLENE 7 0 BV 1 (SUTURE) ×2 IMPLANT
SUT SILK 2 0 SH (SUTURE) ×2 IMPLANT
SUT SILK 3 0 (SUTURE) ×1
SUT SILK 3-0 18XBRD TIE 12 (SUTURE) ×1 IMPLANT
SUT VIC AB 3-0 SH 27 (SUTURE) ×1
SUT VIC AB 3-0 SH 27X BRD (SUTURE) ×1 IMPLANT
SUT VICRYL 4-0 PS2 18IN ABS (SUTURE) ×2 IMPLANT
UNDERPAD 30X30 INCONTINENT (UNDERPADS AND DIAPERS) ×2 IMPLANT
WATER STERILE IRR 1000ML POUR (IV SOLUTION) ×2 IMPLANT

## 2015-07-14 NOTE — Anesthesia Preprocedure Evaluation (Signed)
Anesthesia Evaluation  Patient identified by MRN, date of birth, ID band Patient awake    Reviewed: Allergy & Precautions, NPO status , Patient's Chart, lab work & pertinent test results  Airway Mallampati: II  TM Distance: >3 FB Neck ROM: Full    Dental no notable dental hx.    Pulmonary neg pulmonary ROS,    Pulmonary exam normal breath sounds clear to auscultation       Cardiovascular hypertension, + Peripheral Vascular Disease  negative cardio ROS Normal cardiovascular exam Rhythm:Regular Rate:Normal     Neuro/Psych negative neurological ROS  negative psych ROS   GI/Hepatic negative GI ROS, Neg liver ROS,   Endo/Other  diabetes  Renal/GU DialysisRenal disease  negative genitourinary   Musculoskeletal negative musculoskeletal ROS (+)   Abdominal   Peds negative pediatric ROS (+)  Hematology  (+) anemia ,   Anesthesia Other Findings   Reproductive/Obstetrics negative OB ROS                             Anesthesia Physical Anesthesia Plan  ASA: III  Anesthesia Plan: General   Post-op Pain Management:    Induction: Intravenous  Airway Management Planned: LMA  Additional Equipment:   Intra-op Plan:   Post-operative Plan:   Informed Consent: I have reviewed the patients History and Physical, chart, labs and discussed the procedure including the risks, benefits and alternatives for the proposed anesthesia with the patient or authorized representative who has indicated his/her understanding and acceptance.   Dental advisory given  Plan Discussed with: CRNA and Surgeon  Anesthesia Plan Comments:         Anesthesia Quick Evaluation

## 2015-07-14 NOTE — Care Management Important Message (Signed)
Important Message  Patient Details  Name: David Ford MRN: [id number] Date of Birth: 1941/03/17   Medicare Important Message Given:  Yes    Delorse Lek 07/14/2015, 3:39 PM

## 2015-07-14 NOTE — H&P (View-Only) (Signed)
   Daily Progress Note  Vein mapping appears compatible with L 1st stage brachial vein transposition.  Earliest can get on schedule is this coming Tuesday.  Pt does not need to stay in the hospital from my viewpoint for the procedure.  - will be by on Monday to discuss with the patient  Adele Barthel, MD [facility] [place] Office: [phone] Pager: [phone]  07/10/2015, 8:15 AM

## 2015-07-14 NOTE — Anesthesia Postprocedure Evaluation (Signed)
Anesthesia Post Note  Patient: David Ford  Procedure(s) Performed: Procedure(s) (LRB): BRACHIOBASILIC VEIN TRANSPOSITION   (Left)  Patient location during evaluation: PACU Anesthesia Type: General Level of consciousness: awake and alert Pain management: pain level controlled Vital Signs Assessment: post-procedure vital signs reviewed and stable Respiratory status: spontaneous breathing and respiratory function stable Cardiovascular status: blood pressure returned to baseline and stable Postop Assessment: no signs of nausea or vomiting Anesthetic complications: no    Last Vitals:  Filed Vitals:   07/14/15 0813 07/14/15 1235  BP: 140/65 118/60  Pulse: 73 78  Temp: 36.6 C 36.9 C  Resp: 18 16    Last Pain:  Filed Vitals:   07/14/15 1241  PainSc: Asleep                 Tramaine Sauls S

## 2015-07-14 NOTE — Transfer of Care (Signed)
Immediate Anesthesia Transfer of Care Note  Patient: David Ford  Procedure(s) Performed: Procedure(s): BRACHIOBASILIC VEIN TRANSPOSITION   (Left)  Patient Location: PACU  Anesthesia Type:General  Level of Consciousness: awake, alert , oriented and patient cooperative  Airway & Oxygen Therapy: Patient Spontanous Breathing and Patient connected to nasal cannula oxygen  Post-op Assessment: Report given to RN and Post -op Vital signs reviewed and stable  Post vital signs: Reviewed and stable  Last Vitals:  Filed Vitals:   07/14/15 0415 07/14/15 0813  BP: 140/57 140/65  Pulse: 76 73  Temp: 36.9 C 36.6 C  Resp: 16 18    Complications: No apparent anesthesia complications

## 2015-07-14 NOTE — Progress Notes (Addendum)
OT Cancellation    07/14/15 1100  OT Visit Information  Last OT Received On 07/14/15  Reason Eval/Treat Not Completed Patient at procedure or test/ unavailable - surgery  [facility], OTR/L  [id number] 07/14/2015

## 2015-07-14 NOTE — Discharge Instructions (Signed)
° ° °  07/14/2015 Loanne Drilling [id number] 12-18-1940  Surgeon(s): Elam Dutch, MD  Procedure(s): LEFT BRACHIOBASILIC VEIN 0000000 STAGE  x Do not stick fistula for 12 weeks

## 2015-07-14 NOTE — Anesthesia Procedure Notes (Signed)
Procedure Name: LMA Insertion Date/Time: 07/14/2015 11:15 AM Performed by: Jacquiline Doe A Pre-anesthesia Checklist: Emergency Drugs available, Patient identified, Suction available, Patient being monitored and Timeout performed Patient Re-evaluated:Patient Re-evaluated prior to inductionOxygen Delivery Method: Circle system utilized Preoxygenation: Pre-oxygenation with 100% oxygen Intubation Type: IV induction Ventilation: Mask ventilation without difficulty LMA: LMA inserted LMA Size: 5.0 Tube type: Oral Number of attempts: 2 Placement Confirmation: positive ETCO2 and breath sounds checked- equal and bilateral Tube secured with: Tape Dental Injury: Teeth and Oropharynx as per pre-operative assessment

## 2015-07-14 NOTE — Progress Notes (Signed)
Nutrition Follow-up  DOCUMENTATION CODES:   Not applicable  INTERVENTION:  Continue Nepro Shake po once daily, each supplement provides 425 kcal and 19 grams protein.  Provide 30 ml Prostat po once daily each supplement provides 100 kcal and 15 grams of protein.   Encourage adequate PO intake.   NUTRITION DIAGNOSIS:   Increased nutrient needs related to wound healing as evidenced by estimated needs; ongoing  GOAL:   Patient will meet greater than or equal to 90% of their needs; met  MONITOR:   PO intake, Supplement acceptance, Skin, Weight trends, Labs, I & O's  REASON FOR ASSESSMENT:   Consult Assessment of nutrition requirement/status  ASSESSMENT:   75 yo M with hx of DM, HTN Here with generalized weakness was found to have ARF most likely due to dehydration in the setting of ACEi and was found to have untreated diabetic foot ulcer Pt is now on HD.  Procedure (1/13): Left Foot 1st and 2nd Ray Amputation Local tissue rearrangement for wound closure 4 x 8 cm  MBPJPETKK(4/46): BRACHIOBASILIC VEIN TRANSPOSITION (Left)  Meal completion has been varied from 20-100%, but most po intake has been 100%. Pt currently has Nepro Shake ordered with varied consumption. RD to modify order to provide Nepro Shake and Prostat once daily which will aid in caloric and protein needs.   Labs and medications reviewed.   Diet Order:  Diet renal/carb modified with fluid restriction Diet-HS Snack?: Nothing; Room service appropriate?: Yes; Fluid consistency:: Thin  Skin:   (Incision on L foot, L arm)  Last BM:  1/19  Height:   Ht Readings from Last 1 Encounters:  06/19/15 6' 2"  (1.88 m)    Weight:   Wt Readings from Last 1 Encounters:  07/13/15 211 lb 13.8 oz (96.1 kg)    Ideal Body Weight:  86.4 kg  BMI:  Body mass index is 27.19 kg/(m^2).  Estimated Nutritional Needs:   Kcal:  2400-2600  Protein:  115-125 grams  Fluid:  1.2 L/day  EDUCATION NEEDS:   No  education needs identified at this time  Corrin Parker, MS, RD, LDN Pager # [phone] After hours/ weekend pager # [phone]

## 2015-07-14 NOTE — Interval H&P Note (Signed)
History and Physical Interval Note:  07/14/2015 10:26 AM  David Ford  has presented today for surgery, with the diagnosis of chronic kidney disease  The various methods of treatment have been discussed with the patient and family. After consideration of risks, benefits and other options for treatment, the patient has consented to  Procedure(s): BRACHIOBASILIC VEIN TRANSPOSITION VERSUS ARTERIIOVENOUS GRAFT PLACEMENT  (Left) as a surgical intervention .  The patient's history has been reviewed, patient examined, no change in status, stable for surgery.  I have reviewed the patient's chart and labs.  Questions were answered to the patient's satisfaction.     Ruta Hinds

## 2015-07-14 NOTE — Progress Notes (Signed)
PT Cancellation Note  Patient Details Name: David Ford MRN: [id number] DOB: 1940/12/06   Cancelled Treatment:    Reason Eval/Treat Not Completed: Patient at procedure or test/unavailable   Off the floor for Left brachial vein transposition fistula first stage;   Will follow up later today as time allows;  Otherwise, will follow up for PT tomorrow;   Thank you,  Roney Marion, [place] Pager [phone] Office [phone]     Roney Marion [facility] 07/14/2015, 12:29 PM

## 2015-07-14 NOTE — Progress Notes (Signed)
Patient ID: David Ford, male   DOB: [birth year], 75 y.o.   MRN: [id number] S:no new complaints O:BP 140/65 mmHg  Pulse 73  Temp(Src) 97.8 F (36.6 C) (Oral)  Resp 18  Ht 6\' 2"  (1.88 m)  Wt 96.1 kg (211 lb 13.8 oz)  BMI 27.19 kg/m2  SpO2 97%  Intake/Output Summary (Last 24 hours) at 07/14/15 1158 Last data filed at 07/14/15 1100  Gross per 24 hour  Intake    600 ml  Output      1 ml  Net    599 ml   Intake/Output: I/O last 3 completed shifts: In: 410 [P.O.:300; IV Piggyback:110] Out: 2501 [Other:2500; Stool:1]  Intake/Output this shift:  Total I/O In: 250 [I.V.:250] Out: 0  Weight change:  Gen:WD WN AAM in NAD CVS:no rub Resp:cta [id number] edema, LUE AVF +T/B   Recent Labs Lab 07/08/15 0700 07/08/15 0808 07/10/15 0600 07/10/15 0830 07/13/15 0651 07/14/15 0805  NA 129*  --  127* 128* 129* 133*  K 4.0  --  4.4 4.4 4.5 4.2  CL 91*  --  90* 92* 94* 95*  CO2 25  --  23 25 24 26   GLUCOSE 158*  --  168* 212* 73 102*  BUN 41*  --  38* 37* 42* 29*  CREATININE 8.74*  --  8.39* 8.39* 9.10* 6.95*  ALBUMIN 1.6*  --   --  1.7* 1.5*  --   CALCIUM 7.6* 7.5* 7.8* 7.8* 7.7* 7.8*  PHOS 4.2  --   --  4.6 4.5  --    Liver Function Tests:  Recent Labs Lab 07/08/15 0700 07/10/15 0830 07/13/15 0651  ALBUMIN 1.6* 1.7* 1.5*   No results for input(s): LIPASE, AMYLASE in the last 168 hours. No results for input(s): AMMONIA in the last 168 hours. CBC:  Recent Labs Lab 07/10/15 0600 07/10/15 0830 07/11/15 0449 07/12/15 0730 07/13/15 0631  WBC 6.1 6.0 5.6 7.5 7.9  HGB 7.2* 7.2* 6.6* 7.5* 7.6*  HCT 21.8* 21.9* 19.9* 22.6* 22.7*  MCV 84.5 83.6 82.9 83.1 81.4  PLT 304 311 328 349 356   Cardiac Enzymes: No results for input(s): CKTOTAL, CKMB, CKMBINDEX, TROPONINI in the last 168 hours. CBG:  Recent Labs Lab 07/12/15 2024 07/13/15 1212 07/13/15 1656 07/13/15 2126 07/14/15 0814  GLUCAP 123* 132* 134* 106* 98    Iron Studies: No results for input(s):  IRON, TIBC, TRANSFERRIN, FERRITIN in the last 72 hours. Studies/Results: No results found. [facility] Hold] sodium chloride   Intravenous Once  . [facility] Hold] antiseptic oral rinse  7 mL Mouth Rinse q12n4p  . [MAR Hold] brimonidine  1 drop Both Eyes BID  . [MAR Hold] calcitRIOL  0.5 mcg Oral Q M,W,F-HD  . [MAR Hold] carvedilol  3.125 mg Oral BID WC  . [MAR Hold] cyclobenzaprine  7.5 mg Oral TID  . [MAR Hold] darbepoetin (ARANESP) injection - DIALYSIS  200 mcg Intravenous Q Fri-HD  . [MAR Hold] diclofenac sodium  2 g Topical QID  . [MAR Hold] docusate sodium  200 mg Oral Daily  . [MAR Hold] feeding supplement (NEPRO CARB STEADY)  237 mL Oral BID BM  . [MAR Hold] ferric gluconate (FERRLECIT/NULECIT) IV  125 mg Intravenous Q M,W,F-HD  . [MAR Hold] fluticasone  1 spray Each Nare Daily  . [MAR Hold] gabapentin  300 mg Oral TID  . [MAR Hold] insulin aspart  0-15 Units Subcutaneous TID WC  . [MAR Hold] insulin aspart  0-5 Units Subcutaneous QHS  . [  MAR Hold] insulin aspart  6 Units Subcutaneous TID WC  . [MAR Hold] insulin glargine  15 Units Subcutaneous Daily  . [MAR Hold] latanoprost  1 drop Both Eyes QHS  . [MAR Hold] multivitamin  1 tablet Oral QHS  . [MAR Hold] pantoprazole  40 mg Oral Daily  . [MAR Hold] polyethylene glycol  17 g Oral Daily  . [MAR Hold] rosuvastatin  20 mg Oral Daily  . [MAR Hold] senna-docusate  1 tablet Oral BID  . [MAR Hold] sevelamer carbonate  1,600 mg Oral TID WC  . [MAR Hold] sodium chloride  3 mL Intravenous Q12H    BMET    Component Value Date/Time   NA 133* 07/14/2015 0805   K 4.2 07/14/2015 0805   CL 95* 07/14/2015 0805   CO2 26 07/14/2015 0805   GLUCOSE 102* 07/14/2015 0805   BUN 29* 07/14/2015 0805   CREATININE 6.95* 07/14/2015 0805   CALCIUM 7.8* 07/14/2015 0805   CALCIUM 7.5* 07/08/2015 0808   GFRNONAA 7* 07/14/2015 0805   GFRAA 8* 07/14/2015 0805   CBC    Component Value Date/Time   WBC 7.9 07/13/2015 0631   RBC 2.79* 07/13/2015 0631   HGB  7.6* 07/13/2015 0631   HCT 22.7* 07/13/2015 0631   PLT 356 07/13/2015 0631   MCV 81.4 07/13/2015 0631   MCH 27.2 07/13/2015 0631   MCHC 33.5 07/13/2015 0631   RDW 14.2 07/13/2015 0631   LYMPHSABS 1.9 06/25/2015 0248   MONOABS 1.4* 06/25/2015 0248   EOSABS 0.2 06/25/2015 0248   BASOSABS 0.2* 06/25/2015 0248     Assessment/Plan:  1. AKI/CKD now approaching ESRD . First HD on 06/20/15 and has remained anuric. Multiple renal insults including ischemic ATN in setting of partial SBO, rhabdo, and underlying CKD due to DM and HTN.  1. Has outpatient HD spot TTS schedule at the [facility] clinic. 2. Plan for next HD session on 07/16/15 to get on schedule 2. Vascular access s/p LUE BC AVF placement by Dr. Oneida Alar 07/14/15.   3. HTN controlled 4. Anemai stable on Fe, esa 5. SHPTH follow Ca/Phos/iPTH vit D 6. DM controlled 7. Diabetic foot ulcer, PVD s/p 1st and 2nd ray amputation on 07/03/15 per ortho 8. Debilitation- await SNF placement  [place]

## 2015-07-14 NOTE — Progress Notes (Signed)
07/14/2015 2:43 PM Hemodialysis Outpatient Note; this patient has been accepted at the [facility] center on a Tuesday, Thursday and Saturday 2nd shift schedule. The center can begin treatment on Thursday January 26th. Thank you. Gordy Savers

## 2015-07-14 NOTE — Progress Notes (Signed)
[facility] PROGRESS NOTE  David Ford [id number] DOB: 1940-12-12 DOA: 06/17/2015 PCP: No primary care provider on file.  Assessment/Plan: 74 y.o. male with DM, HTN admitted after a fall from his couch and being on the floor for about 3 days. His found by a friend who then called EMS.  -admitted with renal failure, noted to have rhabdomyolysis, glucose of 454, acute renal failure with a creatinine of 4 and a potassium of 6.0. WBC count was also elevated at 16.2. He was noted to have gangrenous toes on his left foot. He underwent amputation on 1/13. He is still requiring dialysis and the renal team is still trying to determine if his renal function will recuperated. He received a temp right IJ tunneled dialysis cath on 1/16. Renal team attempting to arrange outpatient dialysis for him. Vascular surgery plans for AVG next week.   1. Diabetic foot infection.  -Pt underwent amputation of left 1st and 2nd ray on 1/13. He is completed IV vancomycin and Zosyn -Wound care following.  -I evaluated wound does not appear to have evidence of active infection   -Will follow up with ortho at the office  2. Acute renal failure. Severe Rhabdomyolysis. AKI likely due to Rhabdomyolysis and sepsis from gangrenous toes -Now ESRD. Unfortunately renal function did not recover, and Pt is started on dialysis treatments when first admitted and continues to require dialysis. Nephrology team managing.  -On 07/14/2015 he underwent AV fistula of left arm, procedure performed by Dr Oneida Alar of Vascular Surgery  3. Episode of upper GI bleed  -secondary to NG tube insertion and gastritis - was started on BID Protonix which I have decreased to daily -Hg 7.6 on 1/23, plan to recheck CBC in am  4. AoCD. Renal.  Initially transfused 2 U PRBC on admission. No s/s of active bleeding. -CBC on 07/13/2015 showing Hg of 7.6, will repeat CBC in am  5. Partial small bowel obstruction  Resolved with conservative  management  6. Atrial fibrillation with RVR  -Occurred on hemodialysis on 12/1. 2-D echo revealed EF of 50-55% and grade 1 diastolic dysfunction. TSH was normal at 0.653 on 12/31.  -No further episodes noted since then- will hold off on anticoagulation   7. DM2  -Uncontrolled. HgA1c-16.2 -Continue Lantus 15 units subcutaneous daily and 6 units of NovoLog for mealtime coverage -Blood sugars remained controlled.   8. Essential HTN.  -Blood pressures remained controlled, continue Coreg and Hydralazine  9. Fever on 1/19. No leukocytosis. CXR: no clear infiltrates. Blood culture-pend  -recently completed IV atx. c diff was neg (07/05/15). Cont monitor   Code Status: Full Family Communication:  Disposition Plan: Patient undergoing AV fistula placement on 1/24. Plan for discharge to SNF when medically stable   Consultants:  Nephrology  IR  Ortho    Procedures:  S/p left 1st and 2nd ray on 1/13   IJ tunneled dialysis cath on 1/16  Antibiotics:  vanc 12/28-1/4  Zosyn 12/28-1/4 then 1/01/19/11  (indicate start date, and stop date if known)  HPI/Subjective: Alert. No distress. Denies SOB  Objective: Filed Vitals:   07/14/15 1335 07/14/15 1407  BP: 133/66 145/55  Pulse: 73 75  Temp: 97.5 F (36.4 C) 97.8 F (36.6 C)  Resp: 16 18    Intake/Output Summary (Last 24 hours) at 07/14/15 1454 Last data filed at 07/14/15 1406  Gross per 24 hour  Intake    760 ml  Output     31 ml  Net    729 ml  Filed Weights   07/10/15 2015 07/13/15 0729 07/13/15 1150  Weight: 96 kg (211 lb 10.3 oz) 99.7 kg (219 lb 12.8 oz) 96.1 kg (211 lb 13.8 oz)    Exam:   General:  alert  Cardiovascular:  S1,s2 rrr  Respiratory: few crackles LL  Abdomen: soft, nt,nd   Musculoskeletal: no leg edema    Skin: S/p amputation of 1st and 2nd ray, no evidence of infection  Data Reviewed: Basic Metabolic Panel:  Recent Labs Lab 07/08/15 0700 07/08/15 0808 07/10/15 0600 07/10/15 0830  07/13/15 0651 07/14/15 0805  NA 129*  --  127* 128* 129* 133*  K 4.0  --  4.4 4.4 4.5 4.2  CL 91*  --  90* 92* 94* 95*  CO2 25  --  23 25 24 26   GLUCOSE 158*  --  168* 212* 73 102*  BUN 41*  --  38* 37* 42* 29*  CREATININE 8.74*  --  8.39* 8.39* 9.10* 6.95*  CALCIUM 7.6* 7.5* 7.8* 7.8* 7.7* 7.8*  PHOS 4.2  --   --  4.6 4.5  --    Liver Function Tests:  Recent Labs Lab 07/08/15 0700 07/10/15 0830 07/13/15 0651  ALBUMIN 1.6* 1.7* 1.5*   No results for input(s): LIPASE, AMYLASE in the last 168 hours. No results for input(s): AMMONIA in the last 168 hours. CBC:  Recent Labs Lab 07/10/15 0600 07/10/15 0830 07/11/15 0449 07/12/15 0730 07/13/15 0631  WBC 6.1 6.0 5.6 7.5 7.9  HGB 7.2* 7.2* 6.6* 7.5* 7.6*  HCT 21.8* 21.9* 19.9* 22.6* 22.7*  MCV 84.5 83.6 82.9 83.1 81.4  PLT 304 311 328 349 356   Cardiac Enzymes: No results for input(s): CKTOTAL, CKMB, CKMBINDEX, TROPONINI in the last 168 hours. BNP (last 3 results) No results for input(s): BNP in the last 8760 hours.  ProBNP (last 3 results) No results for input(s): PROBNP in the last 8760 hours.  CBG:  Recent Labs Lab 07/13/15 1212 07/13/15 1656 07/13/15 2126 07/14/15 0814 07/14/15 1237  GLUCAP 132* 134* 106* 98 123*    Recent Results (from the past 240 hour(s))  C difficile quick scan w PCR reflex     Status: None   Collection Time: 07/05/15  9:47 AM  Result Value Ref Range Status   C Diff antigen NEGATIVE NEGATIVE Final   C Diff toxin NEGATIVE NEGATIVE Final   C Diff interpretation Negative for toxigenic C. difficile  Final  Culture, blood (routine x 2)     Status: None (Preliminary result)   Collection Time: 07/10/15  4:15 PM  Result Value Ref Range Status   Specimen Description BLOOD LEFT HAND  Final   Special Requests IN PEDIATRIC BOTTLE 3CC  Final   Culture NO GROWTH 4 DAYS  Final   Report Status PENDING  Incomplete  Culture, blood (routine x 2)     Status: None (Preliminary result)   Collection  Time: 07/10/15  4:25 PM  Result Value Ref Range Status   Specimen Description BLOOD LEFT HAND  Final   Special Requests IN PEDIATRIC BOTTLE 2CC  Final   Culture NO GROWTH 4 DAYS  Final   Report Status PENDING  Incomplete     Studies: No results found.  Scheduled Meds: . sodium chloride   Intravenous Once  . antiseptic oral rinse  7 mL Mouth Rinse q12n4p  . brimonidine  1 drop Both Eyes BID  . calcitRIOL  0.5 mcg Oral Q M,W,F-HD  . carvedilol  3.125 mg Oral BID WC  .  cyclobenzaprine  7.5 mg Oral TID  . darbepoetin (ARANESP) injection - DIALYSIS  200 mcg Intravenous Q Fri-HD  . diclofenac sodium  2 g Topical QID  . docusate sodium  200 mg Oral Daily  . feeding supplement (NEPRO CARB STEADY)  237 mL Oral BID BM  . ferric gluconate (FERRLECIT/NULECIT) IV  125 mg Intravenous Q M,W,F-HD  . fluticasone  1 spray Each Nare Daily  . gabapentin  300 mg Oral TID  . insulin aspart  0-15 Units Subcutaneous TID WC  . insulin aspart  0-5 Units Subcutaneous QHS  . insulin aspart  6 Units Subcutaneous TID WC  . insulin glargine  15 Units Subcutaneous Daily  . latanoprost  1 drop Both Eyes QHS  . multivitamin  1 tablet Oral QHS  . pantoprazole  40 mg Oral Daily  . polyethylene glycol  17 g Oral Daily  . rosuvastatin  20 mg Oral Daily  . senna-docusate  1 tablet Oral BID  . sevelamer carbonate  1,600 mg Oral TID WC  . sodium chloride  3 mL Intravenous Q12H   Continuous Infusions: . sodium chloride 10 mL/hr at 07/03/15 1819  . sodium chloride 10 mL/hr at 07/03/15 2207  . sodium chloride 10 mL/hr at 07/14/15 [id number]    Principal Problem:   Diabetic foot infection ([place]) Active Problems:   Hypertension   Leukocytosis   Elevated troponin   Hyponatremia   Hypoalbuminemia   Hyperkalemia   Rhabdomyolysis   Metabolic acidosis   Acute renal failure (ARF) ([place])   DM type 2 causing renal disease ([place])   Dehydration   Debility   Ileus (H[facility])   SBO (small bowel obstruction) (H[facility])    Gastrointestinal hemorrhage with hematemesis   Atrial fibrillation ([place])    Time spent: 15 minutes     Kelvin Cellar  [facility] Pager [phone]. If 7PM-7AM, please contact night-coverage at [email or web address], password [facility] 07/14/2015, 2:54 PM  LOS: 27 days

## 2015-07-14 NOTE — Clinical Documentation Improvement (Signed)
Internal Medicine  Can the diagnosis of Atrial Fibrillation be further specified? Please document response in next progress note; NOT in BPA drop down box. Thanks!   Chronic Atrial fibrillation  Paroxysmal Atrial fibrillation  Permanent Atrial fibrillation  Persistent Atrial fibrillation  Other  Clinically Undetermined  Document any associated diagnoses/conditions.  Supporting Information:  Please exercise your independent, professional judgment when responding. A specific answer is not anticipated or expected.  Thank You,  Zoila Shutter RN, BSN, [place] [phone]; Cell: [phone]

## 2015-07-14 NOTE — Op Note (Signed)
Procedure: Left brachial vein transposition fistula first stage, Ultrasound guidance  Preoperative diagnosis: End-stage renal disease  Postoperative diagnosis: Same  Anesthesia: Gen.  Assistant: Leontine Locket, PAC  Operative findings: 3 mm left brachial vein, 3 mm left brachial artery  Operative details: After obtaining informed consent, the patient was taken to the operating room. The patient was placed in supine position operating table. After induction of general anesthesia, the patient's entire left upper extremity was prepped and draped in the usual sterile fashion. Next ultrasound was used to identify the left brachial vein. A longitudinal incision was made just above the antecubital crease in order to expose the brachial vein. The vein was of good quality approximately 3 mm in diameter.  Care was taken to try to not injure any sensory nerves and all the motor nerves were identified and protected. The vein was dissected free circumferentially and small side branches ligated and divided between silk ties or clips. Next the brachial artery was exposed adjacent to the vein. The artery was approximately 3 mm in diameter with some spasm. This was dissected free circumferentially. Vessel loops were placed around it. There was some spasm within the artery after dissecting it free. Next the distal brachial vein was ligated with a 3 0 silk tie and the vein transected.  The patient was given 5000 units of intravenous heparin. Vessel loops were used to control the artery proximally and distally. A longitudinal arteriotomy was made with an 11 blade.  The vein was cut to length and sewn end of vein to side of artery using a running 7-0 Prolene suture. Just prior to completion of the anastomosis, it was forebled backbled and thoroughly flushed. The anastomosis was secured and the Vesseloops were released.  There was a palpable thrill in the proximal aspect of the fistula. There was also good Doppler flow  throughout the course of the fistula. At this point all subcutaneous tissues were reapproximated using running 3-0 Vicryl suture. All skin incisions were closed with running 4  0 Vicryl subcuticular stitch. Dermabond was applied to all incisions. The patient tolerated the procedure well and there were no complications. Instrument sponge needle counts were correct at the end of the case. The patient was taken to the recovery room in stable condition.  Ruta Hinds, MD [facility] Office: [phone] Pager: [phone]

## 2015-07-15 ENCOUNTER — Encounter (HOSPITAL_COMMUNITY): Payer: Self-pay | Admitting: Vascular Surgery

## 2015-07-15 ENCOUNTER — Other Ambulatory Visit: Payer: Self-pay | Admitting: *Deleted

## 2015-07-15 DIAGNOSIS — T796XXS Traumatic ischemia of muscle, sequela: Secondary | ICD-10-CM

## 2015-07-15 DIAGNOSIS — N178 Other acute kidney failure: Secondary | ICD-10-CM

## 2015-07-15 DIAGNOSIS — N186 End stage renal disease: Secondary | ICD-10-CM

## 2015-07-15 DIAGNOSIS — Z4931 Encounter for adequacy testing for hemodialysis: Secondary | ICD-10-CM

## 2015-07-15 LAB — CBC
HEMATOCRIT: 23 % — AB (ref 39.0–52.0)
HEMOGLOBIN: 7.4 g/dL — AB (ref 13.0–17.0)
MCH: 27.3 pg (ref 26.0–34.0)
MCHC: 32.2 g/dL (ref 30.0–36.0)
MCV: 84.9 fL (ref 78.0–100.0)
Platelets: 334 10*3/uL (ref 150–400)
RBC: 2.71 MIL/uL — ABNORMAL LOW (ref 4.22–5.81)
RDW: 14.7 % (ref 11.5–15.5)
WBC: 6.5 10*3/uL (ref 4.0–10.5)

## 2015-07-15 LAB — RENAL FUNCTION PANEL
ALBUMIN: 1.6 g/dL — AB (ref 3.5–5.0)
ANION GAP: 10 (ref 5–15)
BUN: 37 mg/dL — ABNORMAL HIGH (ref 6–20)
CHLORIDE: 93 mmol/L — AB (ref 101–111)
CO2: 26 mmol/L (ref 22–32)
Calcium: 7.7 mg/dL — ABNORMAL LOW (ref 8.9–10.3)
Creatinine, Ser: 8.29 mg/dL — ABNORMAL HIGH (ref 0.61–1.24)
GFR calc Af Amer: 7 mL/min — ABNORMAL LOW (ref >60)
GFR calc non Af Amer: 6 mL/min — ABNORMAL LOW (ref >60)
GLUCOSE: 204 mg/dL — AB (ref 65–99)
PHOSPHORUS: 4.5 mg/dL (ref 2.5–4.6)
POTASSIUM: 4.4 mmol/L (ref 3.5–5.1)
Sodium: 129 mmol/L — ABNORMAL LOW (ref 135–145)

## 2015-07-15 LAB — CULTURE, BLOOD (ROUTINE X 2)
CULTURE: NO GROWTH
Culture: NO GROWTH

## 2015-07-15 LAB — GLUCOSE, CAPILLARY
GLUCOSE-CAPILLARY: 141 mg/dL — AB (ref 65–99)
GLUCOSE-CAPILLARY: 132 mg/dL — AB (ref 65–99)
Glucose-Capillary: 196 mg/dL — ABNORMAL HIGH (ref 65–99)

## 2015-07-15 MED ORDER — GABAPENTIN 300 MG PO CAPS: 300.0000 mg | ORAL_CAPSULE | Freq: Every day | ORAL | Status: DC

## 2015-07-15 MED ORDER — DICLOFENAC SODIUM 1 % TD GEL: 2.0000 g | Freq: Four times a day (QID) | TRANSDERMAL | Status: DC

## 2015-07-15 MED ORDER — SEVELAMER CARBONATE 800 MG PO TABS: 1600.0000 mg | ORAL_TABLET | Freq: Three times a day (TID) | ORAL | Status: DC

## 2015-07-15 MED ORDER — CYCLOBENZAPRINE HCL 7.5 MG PO TABS: 7.5000 mg | ORAL_TABLET | Freq: Three times a day (TID) | ORAL | Status: DC

## 2015-07-15 MED ORDER — LATANOPROST 0.005 % OP SOLN: 1.0000 [drp] | Freq: Every day | OPHTHALMIC | Status: DC

## 2015-07-15 MED ORDER — DOCUSATE SODIUM 100 MG PO CAPS: 200.0000 mg | ORAL_CAPSULE | Freq: Every day | ORAL | Status: DC

## 2015-07-15 MED ORDER — INSULIN GLARGINE 100 UNIT/ML ~~LOC~~ SOLN: 15.0000 [IU] | Freq: Every day | SUBCUTANEOUS | Status: DC

## 2015-07-15 MED ORDER — OXYCODONE HCL 5 MG PO TABS: 5.0000 mg | ORAL_TABLET | Freq: Four times a day (QID) | ORAL | Status: DC | PRN

## 2015-07-15 MED ORDER — INSULIN ASPART 100 UNIT/ML ~~LOC~~ SOLN: 6.0000 [IU] | Freq: Three times a day (TID) | SUBCUTANEOUS | Status: DC

## 2015-07-15 MED ORDER — CALCITRIOL 0.5 MCG PO CAPS: 0.5000 ug | ORAL_CAPSULE | ORAL | Status: DC

## 2015-07-15 MED ORDER — POLYETHYLENE GLYCOL 3350 17 G PO PACK: 17.0000 g | PACK | Freq: Every day | ORAL | Status: DC

## 2015-07-15 NOTE — Care Management Note (Signed)
Case Management Note  Patient Details  Name: David Ford MRN: [id number] Date of Birth: 06-Nov-1940  Subjective/Objective:       Diabetic foot infection, amputation of left 1st and 2nd ray on 1/13             Action/Plan: Scheduled dc to SNF, CSW following for SNF placement. See previous NCM notes.   Expected Discharge Date:  07/15/2015               Expected Discharge Plan:  Skilled Nursing Facility  In-House Referral:  Clinical Social Work  Discharge planning Services  CM Consult  Post Acute Care Choice:  NA Choice offered to:  NA  DME Arranged:  N/A DME Agency:  NA  HH Arranged:  NA HH Agency:  NA  Status of Service:  Completed, signed off  Medicare Important Message Given:  Yes Date Medicare IM Given:    Medicare IM give by:    Date Additional Medicare IM Given:    Additional Medicare Important Message give by:     If discussed at [place] of Stay Meetings, dates discussed:    Additional Comments:  Erenest Rasher, RN 07/15/2015, 11:33 AM

## 2015-07-15 NOTE — Discharge Summary (Addendum)
Physician Discharge Summary  David Ford [id number] DOB: 1940-06-26 DOA: 06/17/2015  PCP: No primary care provider on file.  Admit date: 06/17/2015 Discharge date: 07/15/2015  Time spent: 35 minutes  Recommendations for Outpatient Follow-up:  1. Patient was started on hemodialysis during this hospitalization, case discussed with nephrology, next dialysis session would be on 07/16/2015. 2. Please reassess anticoagulation in the outpatient setting, hospitalization complicated by episode of atrial fibrillation as well as episode of upper GI bleed. He had a Hg of 7.4 on 07/15/2015. He has not had further episodes of A. fib during this hospitalization. 3. Recommend wound care consult for amputation of first and second ray left foot. Patient having history of gangrene of 1st and 2nd ray of let foot.  Has outpatient follow-up point with David Ford of orthopedic surgery 4. Patient having a history of insulin dependent diabetes mellitus, please follow-up on blood sugars. 5. Plan to discharge to give nursing facility for rehabilitation   Discharge Diagnoses:  Principal Problem:   Diabetic foot infection ([place]) Active Problems:   Hypertension   Leukocytosis   Elevated troponin   Hyponatremia   Hypoalbuminemia   Hyperkalemia   Rhabdomyolysis   Metabolic acidosis   Acute renal failure (ARF) ([place])   DM type 2 causing renal disease ([place])   Dehydration   Debility   Ileus ([facility])   SBO (small bowel obstruction) ([place])   Gastrointestinal hemorrhage with hematemesis   Atrial fibrillation [facility])   Discharge Condition: Stable  Diet recommendation: Renal diet  Filed Weights   07/13/15 0729 07/13/15 1150 07/14/15 2302  Weight: 99.7 kg (219 lb 12.8 oz) 96.1 kg (211 lb 13.8 oz) 97.8 kg (215 lb 9.8 oz)    History of present illness:  David Ford is a 75 y.o. male   has a past medical history of Diabetes mellitus without complication ([place]) and Hypertension.   Presented with a fall onto the  floor from the couch. He has communicated that has been down on the floor for past 3 days. He was not able to walk but could crawl and states could reach fridge and microwave so he ate and drank. He reports he was able able to call on the phone the entire time but did not request any help until today when he realized he was not getting any better. He has been living in [place] for past 20 years but has a PCP in [place]. He reports flying there every 3 months.  Patient lives at home alone he was found today by the friend who called EMS. Patient has history of diabetes but his glucose meter has been broken. Patient has been fatigued. Denies any loss of consciousness patient reports some file order from his toes. Reports generalized muscle aches. Reports loss of strength in arms and legs. Reports he was able to urinate today.  He is unsure if he has any history of kidney disease. Unsure if he's been taking his medications Emergency department he was found to have evidence of rhabdomyolysis with CKs 3053, his initial lab work showing creatinine of 4 potassium was 6.0 and an gap was 16. Patient was unable to produce urine. Troponin slightly elevated to 0.05 albumin 2.9 White blood cell, was noted to be elevated 16.2 Hospitalist was called for admission for dehydration rhabdomyolysis evidence of acute renal failure  Hospital Course:  David Ford is a pleasant 75 year old gentleman with a history of insulin dependent diabetes mellitus, hypertension, admitted to medicine service on 06/09/2015 when he presented after having a  fall at home. Unfortunately he had been down for the past 3 days. She'll lab work show that he had acute renal failure along with acute kidney injury. On physical examination he had evidence of infection involving his left foot. He was started on empiric IV antimicrobial therapy with vancomycin and Zosyn. Patient was evaluated by nephrology and orthopedic surgery. Renal ultrasound showed  normal-sized kidneys with increased echogenicity, no evidence of obstruction or hydronephrosis. He was started on IV fluid resuscitation. He was evaluated by David Ford of orthopedic surgery found to have gangrene involving left great toe and second toe. Hospitalization was complicated by the development of upper GI bleed as well as small bowel obstruction. Additionally renal function continued to deteriorate. He was evaluated by general surgery for small bowel obstruction. CT scan of abdomen and pelvis do not reveal evidence of ischemic or dead bowel. Meanwhile he was started on hemodialysis after placement of dialysis catheter. Once stabilized he underwent amputation of first and second ray of left foot on 07/03/2015, procedure performed by David Ford. With regard to his renal function, unfortunately renal function did not recover as he was now end-stage renal disease. Vascular surgery was consulted as he underwent AV fistula placement of his left arm on 07/14/2015. He was set up with outpatient hemodialysis. Patient's electrolytes have now stabilized. Hemoglobin remains stable at 7.4 as he has not had further episodes of GI bleed. No bowel obstruction resolved. He underwent CLIPP process. His next session will be on 07/16/2015 which has been set up by nephrology. Plan for discharge to skilled nursing facility for acute rehabilitation as he has significant deconditioning from prolonged hospitalization.  Procedures:  S/p left 1st and 2nd ray on 1/13  IJ tunneled dialysis cath on 1/16  Hemodialysis    AV fistula placement of left arm on 07/14/2015  Consultations:  Nephrology    GI  Cardiology  General surgery  Orthopedic surgery  Discharge Exam: Filed Vitals:   07/15/15 0418 07/15/15 0856  BP: 134/56 137/67  Pulse: 82 75  Temp: 99.4 F (37.4 C) 98.8 F (37.1 C)  Resp: 18 18     General: alert  Cardiovascular: S1,s2 rrr  Respiratory: few crackles LL, normal inspiratory  effort  Abdomen: soft, nt,nd   Musculoskeletal: no leg edema   Skin: S/p amputation of 1st and 2nd ray, no evidence of infection  Discharge Instructions   Discharge Instructions    Call MD for:  difficulty breathing, headache or visual disturbances    Complete by:  As directed      Call MD for:  extreme fatigue    Complete by:  As directed      Call MD for:  hives    Complete by:  As directed      Call MD for:  persistant dizziness or light-headedness    Complete by:  As directed      Call MD for:  persistant nausea and vomiting    Complete by:  As directed      Call MD for:  redness, tenderness, or signs of infection (pain, swelling, redness, odor or green/yellow discharge around incision site)    Complete by:  As directed      Call MD for:  severe uncontrolled pain    Complete by:  As directed      Call MD for:  temperature >100.4    Complete by:  As directed      Call MD for:    Complete by:  As directed  Increase activity slowly    Complete by:  As directed           Current Discharge Medication List    START taking these medications   Details  calcitRIOL (ROCALTROL) 0.5 MCG capsule Take 1 capsule (0.5 mcg total) by mouth every Monday, Wednesday, and Friday with hemodialysis. Qty: 30 capsule, Refills: 1    diclofenac sodium (VOLTAREN) 1 % GEL Apply 2 g topically 4 (four) times daily. Qty: 1 Tube, Refills: 0    docusate sodium (COLACE) 100 MG capsule Take 2 capsules (200 mg total) by mouth daily. Qty: 10 capsule, Refills: 0    insulin aspart (NOVOLOG) 100 UNIT/ML injection Inject 6 Units into the skin 3 (three) times daily with meals. Qty: 10 mL, Refills: 11    insulin glargine (LANTUS) 100 UNIT/ML injection Inject 0.15 mLs (15 Units total) into the skin daily. Qty: 10 mL, Refills: 11    latanoprost (XALATAN) 0.005 % ophthalmic solution Place 1 drop into both eyes at bedtime. Qty: 2.5 mL, Refills: 12    oxyCODONE (ROXICODONE) 5 MG immediate release  tablet Take 1 tablet (5 mg total) by mouth every 6 (six) hours as needed for severe pain. Qty: 20 tablet, Refills: 0    polyethylene glycol (MIRALAX / GLYCOLAX) packet Take 17 g by mouth daily. Qty: 14 each, Refills: 0    sevelamer carbonate (RENVELA) 800 MG tablet Take 2 tablets (1,600 mg total) by mouth 3 (three) times daily with meals. Qty: 30 tablet, Refills: 1      CONTINUE these medications which have CHANGED   Details  gabapentin (NEURONTIN) 300 MG capsule Take 1 capsule (300 mg total) by mouth at bedtime. Qty: 30 capsule, Refills: 1      CONTINUE these medications which have NOT CHANGED   Details  brimonidine (ALPHAGAN P) 0.1 % SOLN Place 1 drop into both eyes 2 (two) times daily.    carvedilol (COREG) 6.25 MG tablet Take 6.25 mg by mouth 2 (two) times daily with a meal.    ezetimibe (ZETIA) 10 MG tablet Take 10 mg by mouth daily.    fluticasone (FLONASE) 50 MCG/ACT nasal spray Place 1 spray into both nostrils daily.    rosuvastatin (CRESTOR) 20 MG tablet Take 20 mg by mouth daily.    trazodone (DESYREL) 300 MG tablet Take 300 mg by mouth at bedtime.      STOP taking these medications     amLODipine (NORVASC) 10 MG tablet      glimepiride (AMARYL) 2 MG tablet      hydrALAZINE (APRESOLINE) 100 MG tablet      linagliptin (TRADJENTA) 5 MG TABS tablet      lisinopril (PRINIVIL,ZESTRIL) 5 MG tablet      traMADol (ULTRAM) 50 MG tablet      Travoprost, BAK Free, (TRAVATAN) 0.004 % SOLN ophthalmic solution        No Known Allergies Follow-up Information    Follow up with DUDA,MARCUS V, MD In 2 weeks.   Specialty:  Orthopedic Surgery   Why:  Appointment Date: 07/29/2015 @1 :30pm    Contact information:   [address] [place] [address] [phone]       Follow up with Adele Barthel, MD In 6 weeks.   Specialties:  Vascular Surgery, Cardiology   Why:  Our office will call you to arrange an appointment (sent)   Contact information:   [address] [place] [address] [phone]        The results  of significant diagnostics from this hospitalization (including imaging, microbiology, ancillary and laboratory) are listed below for reference.    Significant Diagnostic Studies: Ct Abdomen Pelvis Wo Contrast  06/19/2015  CLINICAL DATA:  15 YOM PRESENTS WITH SMALL BOWEL OBSTRUCTION EXAM: CT ABDOMEN AND PELVIS WITHOUT CONTRAST TECHNIQUE: Multidetector CT imaging of the abdomen and pelvis was performed following the standard protocol without IV contrast. COMPARISON:  06/19/2015 plain films FINDINGS: Lower chest: Bilateral small pleural effusions and bibasilar atelectasis. Nasogastric tube in place. Hiatal hernia noted. Upper abdomen: Scalloped contour of the liver noted. No focal abnormality identified within the liver, spleen, pancreas. Adrenal glands are diffusely mildly prominent. No focal lesions are identified however. No focal abnormality identified within the kidneys. No hydronephrosis. Gallbladder is present. Gastrointestinal tract: Stomach is mildly distended and contains contrast. The ligament of Treitz is inferiorly positioned, consistent partial malrotation. The proximal small bowel loops are dilated with mild tapering in the left mid abdomen to normal caliber. The distal small bowel loops are normal in caliber. The appendix has a normal appearance. Small amounts of ascites. There is abnormal appearance of the cecum. There is a small amount of contrast in the cecal tip. However, the wall of the ascending colon appears very thickened and there is associated stranding within the mesentery. There is thickening of the right peritoneal reflection adjacent to the colon. The findings are suspicious for neoplasm. Other considerations include typhlitis or colitis. Contrast is seen beyond this level to normal appearing hepatic flexure and distal colon. Pelvis: Urinary bladder contains Foley catheter. Prostatic seeds are noted. Seminal vesicles have  a normal appearance. There is moderate stool within the sigmoid and rectum. No free pelvic fluid. Retroperitoneum: There is moderate atherosclerosis of the abdominal aorta. No retroperitoneal or mesenteric adenopathy. Abdominal wall: Small left inguinal hernia contains ascites. Osseous structures: Mild degenerative changes are seen in the spine. IMPRESSION: 1. Partial small bowel obstruction, best localized to the left mid abdomen. 2. Abnormal appearance of the cecum and ascending colon. Findings are suspicious for malignancy. Other considerations include typhlitis or colitis. 3. Small bilateral pleural effusions.  Hiatal hernia. 4. Cirrhotic contour of the liver. 5. Partial malrotation of the small bowel. 6. Prostatic seeds. 7. Abdominal aortic atherosclerosis. Electronically Signed   By: Nolon Nations M.D.   On: 06/19/2015 17:46   Dg Chest 2 View  07/10/2015  CLINICAL DATA:  Fever, abdominal bloating. EXAM: CHEST  2 VIEW COMPARISON:  06/19/2015 FINDINGS: Right dialysis catheter in place with the tip in the SVC. Heart is mildly enlarged. No confluent airspace opacities, effusions or edema. No acute bony abnormality. IMPRESSION: Borderline cardiomegaly.  No active disease. Electronically Signed   By: Rolm Baptise M.D.   On: 07/10/2015 14:50   Dg Chest 2 View  06/17/2015  CLINICAL DATA:  Weakness. EXAM: CHEST  2 VIEW COMPARISON:  None. FINDINGS: Trachea is midline. Heart size within normal limits. Lungs are clear. No pleural fluid. Degenerative changes are seen in the spine. IMPRESSION: No acute findings. Electronically Signed   By: Lorin Picket M.D.   On: 06/17/2015 18:10   Dg Cervical Spine 2 Or 3 Views  07/07/2015  CLINICAL DATA:  Neck stiffness on waking up this morning. Pain. History of prior fall. Initial encounter. EXAM: CERVICAL SPINE - 2-3 VIEW COMPARISON:  None. FINDINGS: Straightening of the normal cervical lordosis is identified. There is multilevel loss of disc space height appearing  worst a C5-6. Multilevel facet degenerative disease is also identified. Prevertebral soft tissues appear normal.  Lung apices are clear. IMPRESSION: No acute abnormality. Multilevel spondylosis. Electronically Signed   By: Inge Rise M.D.   On: 07/07/2015 14:40   Dg Abd 1 View  07/10/2015  CLINICAL DATA:  Fever, abdominal bloating. EXAM: ABDOMEN - 1 VIEW COMPARISON:  06/23/2015 FINDINGS: Gas throughout nondistended stomach, large and small bowel. No organomegaly or free air. No suspicious calcification. Radiation seeds in the region of the prostate. IMPRESSION: No acute findings. Gas throughout nondistended large and small bowel. Electronically Signed   By: Rolm Baptise M.D.   On: 07/10/2015 14:51   Dg Abd 1 View  06/17/2015  CLINICAL DATA:  Abdominal distention EXAM: ABDOMEN - 1 VIEW COMPARISON:  None. FINDINGS: Brachytherapy seeds overlie the prostate. There mildly dilated small bowel loops in the central abdomen measuring up to 3.8 cm in diameter. Mild stool is seen throughout the colon. No evidence of pneumatosis or pneumoperitoneum. No pathologic soft tissue calcifications. IMPRESSION: Mildly dilated small bowel loops in the central abdomen, for which the differential includes a mid to distal small bowel obstruction or adynamic ileus. Electronically Signed   By: Ilona Sorrel M.D.   On: 06/17/2015 21:12   US Renal  06/18/2015  CLINICAL DATA:  Acute onset of renal failure.  Initial encounter. EXAM: RENAL / URINARY TRACT ULTRASOUND COMPLETE COMPARISON:  None. FINDINGS: Right Kidney: Length: 10.2 cm. Increased parenchymal echogenicity noted. A small 1.1 cm cyst is noted at the interpole region of the right kidney. No hydronephrosis visualized. Left Kidney: Length: 11.9 cm. Mildly increased parenchymal echogenicity noted. No mass or hydronephrosis visualized. Bladder: Decompressed and not well assessed. IMPRESSION: 1. No evidence of hydronephrosis. 2. Increased renal parenchymal echogenicity likely  reflects medical renal disease. 3. Small right renal cyst noted. Electronically Signed   By: Garald Balding M.D.   On: 06/18/2015 00:41   Ir Fluoro Guide Cv Line Right  07/06/2015  CLINICAL DATA:  Acute renal injury, hypertension, diabetes, no current permanent access. EXAM: ULTRASOUND GUIDANCE FOR VASCULAR ACCESS RIGHT INTERNAL JUGULAR PERMANENT HEMODIALYSIS CATHETER Date:  1/16/20171/16/2017 10:06 am Radiologist:  M. Daryll Brod, MD Guidance:  Ultrasound and fluoroscopic FLUOROSCOPY TIME:  24 seconds, 2 mGy MEDICATIONS AND MEDICAL HISTORY: 2 g Ancef administered within 1 hour of the procedure, 1 mg Versed, 100 mcg fentanyl ANESTHESIA/SEDATION: 15 minutes CONTRAST:  None. COMPLICATIONS: None immediate PROCEDURE: Informed consent was obtained from the patient following explanation of the procedure, risks, benefits and alternatives. The patient understands, agrees and consents for the procedure. All questions were addressed. A time out was performed. Maximal barrier sterile technique utilized including caps, mask, sterile gowns, sterile gloves, large sterile drape, hand hygiene, and 2% chlorhexidine scrub. Under sterile conditions and local anesthesia, right internal jugular micropuncture venous access was performed with ultrasound. Images were obtained for documentation. A guide wire was inserted followed by a transitional dilator. Next, a 0.035 guidewire was advanced into the IVC with a 5-French catheter. Measurements were obtained from the right venotomy site to the proximal right atrium. In the right infraclavicular chest, a subcutaneous tunnel was created under sterile conditions and local anesthesia. 1% lidocaine with epinephrine was utilized for this. The 19 cm tip to cuff dialysis catheter was tunneled subcutaneously to the venotomy site and inserted into the SVC/RA junction through a valved peel-away sheath. Position was confirmed with fluoroscopy. Images were obtained for documentation. Blood was  aspirated from the catheter followed by saline and heparin flushes. The appropriate volume and strength of heparin was instilled in each lumen. Caps were  applied. The catheter was secured at the tunnel site with Gelfoam and a pursestring suture. The venotomy site was closed with subcuticular Vicryl suture. Dermabond was applied to the small right neck incision. A dry sterile dressing was applied. The catheter is ready for use. No immediate complications. IMPRESSION: Ultrasound and fluoroscopically guided right internal jugular tunneled hemodialysis catheter (19 cm tip to cuff palindrome dialysis catheter). Electronically Signed   By: Jerilynn Mages.  Shick M.D.   On: 07/06/2015 11:35   Ir US Guide Vasc Access Right  07/06/2015  CLINICAL DATA:  Acute renal injury, hypertension, diabetes, no current permanent access. EXAM: ULTRASOUND GUIDANCE FOR VASCULAR ACCESS RIGHT INTERNAL JUGULAR PERMANENT HEMODIALYSIS CATHETER Date:  1/16/20171/16/2017 10:06 am Radiologist:  M. Daryll Brod, MD Guidance:  Ultrasound and fluoroscopic FLUOROSCOPY TIME:  24 seconds, 2 mGy MEDICATIONS AND MEDICAL HISTORY: 2 g Ancef administered within 1 hour of the procedure, 1 mg Versed, 100 mcg fentanyl ANESTHESIA/SEDATION: 15 minutes CONTRAST:  None. COMPLICATIONS: None immediate PROCEDURE: Informed consent was obtained from the patient following explanation of the procedure, risks, benefits and alternatives. The patient understands, agrees and consents for the procedure. All questions were addressed. A time out was performed. Maximal barrier sterile technique utilized including caps, mask, sterile gowns, sterile gloves, large sterile drape, hand hygiene, and 2% chlorhexidine scrub. Under sterile conditions and local anesthesia, right internal jugular micropuncture venous access was performed with ultrasound. Images were obtained for documentation. A guide wire was inserted followed by a transitional dilator. Next, a 0.035 guidewire was advanced into the  IVC with a 5-French catheter. Measurements were obtained from the right venotomy site to the proximal right atrium. In the right infraclavicular chest, a subcutaneous tunnel was created under sterile conditions and local anesthesia. 1% lidocaine with epinephrine was utilized for this. The 19 cm tip to cuff dialysis catheter was tunneled subcutaneously to the venotomy site and inserted into the SVC/RA junction through a valved peel-away sheath. Position was confirmed with fluoroscopy. Images were obtained for documentation. Blood was aspirated from the catheter followed by saline and heparin flushes. The appropriate volume and strength of heparin was instilled in each lumen. Caps were applied. The catheter was secured at the tunnel site with Gelfoam and a pursestring suture. The venotomy site was closed with subcuticular Vicryl suture. Dermabond was applied to the small right neck incision. A dry sterile dressing was applied. The catheter is ready for use. No immediate complications. IMPRESSION: Ultrasound and fluoroscopically guided right internal jugular tunneled hemodialysis catheter (19 cm tip to cuff palindrome dialysis catheter). Electronically Signed   By: Jerilynn Mages.  Shick M.D.   On: 07/06/2015 11:35   Dg Chest Port 1 View  06/19/2015  CLINICAL DATA:  Nasogastric tube and hemodialysis catheter placement EXAM: PORTABLE CHEST 1 VIEW COMPARISON:  Portable exam 1551 hours compared to 06/17/2015 FINDINGS: Nasogastric tube present extending into stomach. RIGHT jugular central venous catheter with tip projecting over SVC. Borderline enlargement of cardiac silhouette with slight vascular congestion. Atherosclerotic calcification at aortic arch. Lungs clear. No pleural effusion or pneumothorax. IMPRESSION: No acute abnormalities. Electronically Signed   By: Lavonia Dana M.D.   On: 06/19/2015 16:11   Dg Shoulder Right Port  06/18/2015  CLINICAL DATA:  Patient with diffuse right shoulder pain. No known injury. EXAM:  PORTABLE RIGHT SHOULDER - 2+ VIEW COMPARISON:  Chest radiograph 06/17/2015 FINDINGS: Limited evaluation due to inability to position patient. No evidence for displaced fracture. AC joint degenerative changes. Visualized right hemi thorax is unremarkable. IMPRESSION: Limited exam due  to difficulty with patient positioning. Within the above limitation, no evidence for displaced fracture. Recommend evaluation with standard radiographic views when patient clinically able. Electronically Signed   By: Lovey Newcomer M.D.   On: 06/18/2015 13:28   Dg Abd Portable 1v-small Bowel Obstruction Protocol-initial, 8 Hr Delay  06/23/2015  CLINICAL DATA:  75 year old male with a history of small bowel obstruction. EXAM: PORTABLE ABDOMEN - 1 VIEW COMPARISON:  Multiple prior plain film, most recent 06/23/2015, 06/22/2015, 06/21/2015, 06/20/2015, CT 06/19/2015 FINDINGS: Gastric tube projects over the lower mediastinum and terminates in the region of the stomach, unchanged. There has been progression of enteric contrast to the distal small bowel and colon. The opacified terminal ileum and length of the colon a decompressed. Small bowel loops within the mid and left abdomen are persistently borderline dilated, although the diameter is not as exaggerated as the most recent comparison plain film. IMPRESSION: Compared to prior plain films there has been progression of enteric contrast to the terminal ileum and throughout the length of the colon, with decreasing diameter of borderline dilated small bowel loops in the mid left abdomen. Findings suggest resolving partial bowel obstruction. Unchanged gastric tube. Signed, Dulcy Fanny. Earleen Newport, DO [facility] Electronically Signed   By: Corrie Mckusick D.O.   On: 06/23/2015 20:40   Dg Abd Portable 1v  06/23/2015  CLINICAL DATA:  Small bowel obstruction EXAM: PORTABLE ABDOMEN - 1 VIEW COMPARISON:  06/22/2015 FINDINGS: Loops of small bowel are  similar to the prior exam and measuring up to 5.8 cm in diameter, previously the same by my measurements. There is gas within nondilated transverse colon. Nasogastric tube remains coiled in the stomach. IMPRESSION: 1. Similar degree of dilatation of small bowel loops, measuring up to 5.8 cm in diameter. Ford the lack of colonic dilatation, appearance favors obstruction over ileus. Electronically Signed   By: Van Clines M.D.   On: 06/23/2015 07:58   Dg Abd Portable 1v  06/22/2015  CLINICAL DATA:  NG tube placement.  Hemoptysis. EXAM: PORTABLE ABDOMEN - 1 VIEW COMPARISON:  Radiographs earlier this day at 0550 hour FINDINGS: Tip and side-port of the enteric tube below the diaphragm, tip directed towards the fundus, unchanged from prior exam. Diffusely dilated small bowel loops in the central abdomen, with equivocal improvement, greatest dimension 5.3 cm, previously 5.9 cm. No evidence of free air. IMPRESSION: Persistent small-bowel obstruction, with equivocal improvement and decrease in degree of bowel distention. Enteric tube remains in place. Electronically Signed   By: Jeb Levering M.D.   On: 06/22/2015 19:32   Dg Abd Portable 1v  06/22/2015  CLINICAL DATA:  75 year old male with abdominal pain and distention. Small bowel obstruction. EXAM: PORTABLE ABDOMEN - 1 VIEW COMPARISON:  Abdominal radiograph 06/21/2015. FINDINGS: Nasogastric tube coiled in the stomach with tip in the region of the cardia. Gaseous distention throughout the small bowel which is dilated up to 5.9 cm in diameter. Some gas is also noted throughout the colon. No gross evidence of pneumoperitoneum. Calcific density projecting over the left ilium, presumably something ingested. This is move slightly compared to yesterday's examination, and is new compared to prior study 06/20/2015. IMPRESSION: 1. Findings remain compatible with partial small bowel obstruction, as above. 2. No gross evidence of pneumoperitoneum on this single supine  view. 3. Nasogastric tube is coiled back upon itself with tip in the cardia of the stomach. Electronically Signed   By: Vinnie Langton M.D.   On: 06/22/2015 08:01  Dg Abd Portable 1v  06/21/2015  CLINICAL DATA:  Small bowel obstruction EXAM: PORTABLE ABDOMEN - 1 VIEW COMPARISON:  06/20/2015 FINDINGS: Nasogastric tube is in place with tip overlying the level of the stomach. Numerous dilated small bowel loops are again identified. There is paucity of large bowel gas, identified within nondilated loops. No evidence for free intraperitoneal air on this supine view of the abdomen. Note is made of prostatic seeds. IMPRESSION: Persistent, stable dilatation of small bowel loops consistent with partial small bowel obstruction. Electronically Signed   By: Nolon Nations M.D.   On: 06/21/2015 08:56   Dg Abd Portable 1v  06/20/2015  CLINICAL DATA:  Small bowel obstruction EXAM: PORTABLE ABDOMEN - 1 VIEW COMPARISON:  06/19/2015 FINDINGS: Nasogastric tube is coiled in the stomach. Stacked loops of gas-filled small bowel are present in the left abdomen, similar to prior, measuring up to 5.2 cm diameter, previously 4.8 cm diameter. No dilated colon is observed. No secondary signs of free intraperitoneal gas. IMPRESSION: 1. Slight increase in small bowel dilation, individual loops measuring up to 5.2 cm in diameter. Appearance compatible with small bowel obstruction. Electronically Signed   By: Van Clines M.D.   On: 06/20/2015 08:16   Dg Abd Portable 1v  06/19/2015  CLINICAL DATA:  Nasogastric tube placement EXAM: PORTABLE ABDOMEN - 1 VIEW COMPARISON:  Study obtained earlier in the day FINDINGS: Nasogastric tube tip and side port are in the stomach. Stomach is no longer distended with air. There are multiple loops of dilated small bowel, concerning for a degree of obstruction. No free air is seen on this supine examination. IMPRESSION: Nasogastric tube tip and side port in stomach. Stomach no longer distended  with air. There remain loops of dilated small bowel, concerning for a degree of obstruction. Electronically Signed   By: Lowella Grip III M.D.   On: 06/19/2015 16:14   Dg Abd Portable 1v  06/19/2015  CLINICAL DATA:  Abdominal distension EXAM: PORTABLE ABDOMEN - 1 VIEW COMPARISON:  06/17/2015 FINDINGS: Interval significant distension of the stomach is seen. Increasing small bowel dilatation is noted. Persisting colonic air is noted consistent with a partial small bowel obstruction. Prostate brachytherapy seeds are noted. No free air is noted. IMPRESSION: Increasing distension of the small bowel and stomach as described. Decompression is recommended. Electronically Signed   By: Inez Catalina M.D.   On: 06/19/2015 10:54   Dg Foot Complete Left  06/17/2015  CLINICAL DATA:  Unable to walk.  Foot pain.  Diabetes. EXAM: LEFT FOOT - COMPLETE 3+ VIEW COMPARISON:  None. FINDINGS: Questionable soft tissue ulceration at the tips of the first and second toes. 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 No visible radiographic changes of acute osteomyelitis. No fracture, subluxation or dislocation. IMPRESSION: No radiographic changes of osteomyelitis. Electronically Signed   By: Rolm Baptise M.D.   On: 06/17/2015 18:11    Microbiology: Recent Results (from the past 240 hour(s))  Culture, blood (routine x 2)     Status: None   Collection Time: 07/10/15  4:15 PM  Result Value Ref Range Status   Specimen Description BLOOD LEFT HAND  Final   Special Requests IN PEDIATRIC BOTTLE 3CC  Final   Culture NO GROWTH 5 DAYS  Final   Report Status 07/15/2015 FINAL  Final  Culture, blood (routine x 2)     Status: None   Collection Time: 07/10/15  4:25 PM  Result Value Ref Range Status   Specimen  Description BLOOD LEFT HAND  Final   Special Requests IN PEDIATRIC BOTTLE [facility]  Final   Culture NO GROWTH 5 DAYS  Final   Report Status 07/15/2015 FINAL  Final     Labs: Basic Metabolic  Panel:  Recent Labs Lab 07/10/15 0600 07/10/15 0830 07/13/15 0651 07/14/15 0805 07/15/15 0630  NA 127* 128* 129* 133* 129*  K 4.4 4.4 4.5 4.2 4.4  CL 90* 92* 94* 95* 93*  CO2 23 25 24 26 26   GLUCOSE 168* 212* 73 102* 204*  BUN 38* 37* 42* 29* 37*  CREATININE 8.39* 8.39* 9.10* 6.95* 8.29*  CALCIUM 7.8* 7.8* 7.7* 7.8* 7.7*  PHOS  --  4.6 4.5  --  4.5   Liver Function Tests:  Recent Labs Lab 07/10/15 0830 07/13/15 0651 07/15/15 0630  ALBUMIN 1.7* 1.5* 1.6*   No results for input(s): LIPASE, AMYLASE in the last 168 hours. No results for input(s): AMMONIA in the last 168 hours. CBC:  Recent Labs Lab 07/10/15 0830 07/11/15 0449 07/12/15 0730 07/13/15 0631 07/15/15 0630  WBC 6.0 5.6 7.5 7.9 6.5  HGB 7.2* 6.6* 7.5* 7.6* 7.4*  HCT 21.9* 19.9* 22.6* 22.7* 23.0*  MCV 83.6 82.9 83.1 81.4 84.9  PLT 311 328 349 356 334   Cardiac Enzymes: No results for input(s): CKTOTAL, CKMB, CKMBINDEX, TROPONINI in the last 168 hours. BNP: BNP (last 3 results) No results for input(s): BNP in the last 8760 hours.  ProBNP (last 3 results) No results for input(s): PROBNP in the last 8760 hours.  CBG:  Recent Labs Lab 07/14/15 1237 07/14/15 1717 07/14/15 2123 07/15/15 0758 07/15/15 1222  GLUCAP 123* 224* 178* 196* 141*       Signed:  Kelvin Cellar MD.  [facility] 07/15/2015, 3:03 PM

## 2015-07-15 NOTE — Clinical Social Work Note (Signed)
CSW working with [facility] regarding patient discharging to their facility for rehab. Mr. Manchego is [facility] HMO and a recent PT note is needed and request was made for PT to see patient this afternoon.  Facility will accept and LOG and patient can discharge to facility this evening, transported by ambulance. CSW has been in constant contact with patient's daughter Kethan Mamon by phone regarding discharge and other questions and concerns she and family had.  Daughter Amedeo Gory is coming to hospital this evening to pick-up a SCAT application - professional verification section completed by CSW.   Tawnie Ehresman Givens, MSW, LCSW Licensed Clinical Social Worker [place] [phone]

## 2015-07-15 NOTE — Progress Notes (Signed)
Physical Therapy Treatment Patient Details Name: David Ford MRN: [id number] DOB: Nov 02, 1940 Today's Date: 07/15/2015    History of Present Illness 75 yo M with hx of DM, HTN Here with generalized weakness was found to have ARF most likely due to dehydration in the setting of ACEi and was found to have untreated diabetic foot ulcer.  Had ray amputation of  L foot on 07/03/15. rhabdomyolysis . ARF. SBO.     PT Comments    Patient having more difficulty today. Demonstrates significant paroxysmal myoclonic jerks resulting in BIL lower extremity buckling upon standing. He was unsafe to transfer at this time. Very motivated, tolerating therapeutic exercises well. Patient will continue to benefit from skilled physical therapy services to further improve independence with functional mobility.   Follow Up Recommendations  SNF     Equipment Recommendations  None recommended by PT    Recommendations for Other Services       Precautions / Restrictions Precautions Precautions: Fall Required Braces or Orthoses: Other Brace/Splint Other Brace/Splint: darco shoe Restrictions Weight Bearing Restrictions: Yes LLE Weight Bearing: Non weight bearing (Through heel only for transfer only)    Mobility  Bed Mobility Overal bed mobility: Needs Assistance Bed Mobility: Supine to Sit     Supine to sit: Min assist     General bed mobility comments: Min assist for patient to pull through PTs hand to rise to EOB with scoot from bed pad. Cues for technique throughout  Transfers Overall transfer level: Needs assistance Equipment used: Rolling walker (2 wheeled) Transfers: Sit to/from Stand Sit to Stand: Mod assist         General transfer comment: Mod assist for boost to stand. VC for hand placement. Pt with aggressive paroxysmal myoclonic jerks preventing pt from safely transfering at this time. LEs buckle during these episodes and pt was only able to tolerate standing for several seconds  with moderate assist to maintain standing. Practiced sit<>stand x2 from lowest bed setting with cues for awareness and safety.   Ambulation/Gait                 Stairs            Wheelchair Mobility    Modified Rankin (Stroke Patients Only)       Balance                                    Cognition Arousal/Alertness: Awake/alert Behavior During Therapy: Flat affect Overall Cognitive Status: Impaired/Different from baseline Area of Impairment: Safety/judgement;Problem solving         Safety/Judgement: Decreased awareness of deficits   Problem Solving: Difficulty sequencing;Slow processing;Requires verbal cues      Exercises General Exercises - Lower Extremity Ankle Circles/Pumps: AROM;Both;10 reps;Supine Long Arc Quad: Both;10 reps;Seated;Strengthening Hip Flexion/Marching: Strengthening;Both;10 reps;Seated    General Comments        Pertinent Vitals/Pain Pain Assessment: No/denies pain Pain Intervention(s): Monitored during session    Home Living                      Prior Function            PT Goals (current goals can now be found in the care plan section) Acute Rehab PT Goals Patient Stated Goal: none stated PT Goal Formulation: With patient Time For Goal Achievement: 07/20/15 Potential to Achieve Goals: Good Progress towards PT goals: Progressing toward goals  Frequency  Min 3X/week    PT Plan Current plan remains appropriate    Co-evaluation             End of Session Equipment Utilized During Treatment: Gait belt Activity Tolerance: Patient limited by fatigue Patient left: with call bell/phone within reach;in bed;with bed alarm set;with SCD's reapplied     Time: [id number] PT Time Calculation (min) (ACUTE ONLY): 17 min  Charges:  $Therapeutic Activity: 8-22 mins                    G Codes:      [name] Aug 10, 2015, 5:57 PM [facility], [place]

## 2015-07-15 NOTE — NC FL2 (Signed)
[place] LEVEL OF CARE SCREENING TOOL     IDENTIFICATION  Patient Name: David Ford Birthdate: [birth year] Sex: male Admission Date (Current Location): 06/17/2015  [facility] and [place] Number:  Herbalist and Address:  [facility], [place] [address], [place], [place] [address]      Provider Number: [id number]  Attending Physician Name and Address:  Kelvin Cellar, MD  Relative Name and Phone Number:  [name] - daughter.  Phone number [phone]    Current Level of Care: Hospital Recommended Level of Care: [place] Prior Approval Number:    Date Approved/Denied:   PASRR Number:    Discharge Plan: SNF    Current Diagnoses: Patient Active Problem List   Diagnosis Date Noted  . Atrial fibrillation ([place]) 06/21/2015  . SBO (small bowel obstruction) ([place]) 06/19/2015  . Gastrointestinal hemorrhage with hematemesis 06/19/2015  . Ileus ([place]) 06/18/2015  . Hypertension 06/17/2015  . Leukocytosis 06/17/2015  . Elevated troponin 06/17/2015  . Hyponatremia 06/17/2015  . Hypoalbuminemia 06/17/2015  . Hyperkalemia 06/17/2015  . Rhabdomyolysis 06/17/2015  . Metabolic acidosis [id number]  . Acute renal failure (ARF) ([place]) 06/17/2015  . DM type 2 causing renal disease ([place]) 06/17/2015  . Dehydration 06/17/2015  . Debility 06/17/2015  . Diabetic foot infection ([place]) 06/17/2015  . Abdominal distension   . Non-traumatic rhabdomyolysis     Orientation RESPIRATION BLADDER Height & Weight    Self, Time, Situation, Place  Normal Continent 6\' 2"  (188 cm) 215 lbs.  BEHAVIORAL SYMPTOMS/MOOD NEUROLOGICAL BOWEL NUTRITION STATUS      Continent Diet (Renal carb modified with fluid restriction)  AMBULATORY STATUS COMMUNICATION OF NEEDS Skin   Extensive Assist Verbally Normal, Other (Comment) (Incisions to left foot, right chest and left arm)                       Personal Care Assistance Level of Assistance   Bathing, Feeding, Dressing Bathing Assistance: Maximum assistance Feeding assistance: Independent Dressing Assistance: Maximum assistance     Functional Limitations Info  Sight, Hearing, Speech Sight Info: Adequate Hearing Info: Adequate Speech Info: Adequate    SPECIAL CARE FACTORS FREQUENCY  PT (By licensed PT)     PT Frequency: PT evaluation 12/29. Minimium of 3x per week recommended OT Frequency: OT evaluation 1/3. Minimum of 2x per week recommended             Contractures Contractures Info: Not present    Additional Factors Info  Code Status, Allergies Code Status Info: Full Code Allergies Info: No known allergies   Insulin Sliding Scale Info: 4 times per day sliding scale       Current Medications (07/15/2015):  This is the current hospital active medication list Current Facility-Administered Medications  Medication Dose Route Frequency Provider Last Rate Last Dose  . 0.9 %  sodium chloride infusion   Intravenous Continuous Duane Boston, MD 10 mL/hr at 07/03/15 1819    . 0.9 %  sodium chloride infusion   Intravenous Continuous Newt Minion, MD 10 mL/hr at 07/03/15 2207    . 0.9 %  sodium chloride infusion   Intravenous Once Kinnie Feil, MD      . 0.9 %  sodium chloride infusion   Intravenous Continuous Myrtie Soman, MD 10 mL/hr at 07/14/15 [phone]    . acetaminophen (TYLENOL) tablet 650 mg  650 mg Oral Q6H PRN Newt Minion, MD   650 mg at 07/11/15 1818  Or  . acetaminophen (TYLENOL) suppository 650 mg  650 mg Rectal Q6H PRN Newt Minion, MD      . antiseptic oral rinse (CPC / CETYLPYRIDINIUM CHLORIDE 0.05%) solution 7 mL  7 mL Mouth Rinse q12n4p Janece Canterbury, MD   7 mL at 07/14/15 1600  . brimonidine (ALPHAGAN) 0.2 % ophthalmic solution 1 drop  1 drop Both Eyes BID Janece Canterbury, MD   1 drop at 07/15/15 [phone]  . calcitRIOL (ROCALTROL) capsule 0.5 mcg  0.5 mcg Oral Q M,W,F-HD Mauricia Area, MD   0.5 mcg at 07/13/15 1042  . carvedilol (COREG) tablet  3.125 mg  3.125 mg Oral BID WC Mauricia Area, MD   3.125 mg at 07/15/15 [id number]  . cyclobenzaprine (FLEXERIL) tablet 7.5 mg  7.5 mg Oral TID Debbe Odea, MD   7.5 mg at 07/15/15 [id number]  . Darbepoetin Alfa (ARANESP) injection 200 mcg  200 mcg Intravenous Q Fri-HD Mauricia Area, MD   200 mcg at 07/10/15 1138  . diclofenac sodium (VOLTAREN) 1 % transdermal gel 2 g  2 g Topical QID Debbe Odea, MD   2 g at 07/15/15 0830  . docusate sodium (COLACE) capsule 200 mg  200 mg Oral Daily Mauricia Area, MD   200 mg at 07/15/15 [id number]  . feeding supplement (NEPRO CARB STEADY) liquid 237 mL  237 mL Oral Q1500 [place], RD      . feeding supplement (PRO-STAT SUGAR FREE 64) liquid 30 mL  30 mL Oral Daily Dale [place], RD   30 mL at 07/15/15 [id number]  . ferric gluconate (NULECIT) 125 mg in sodium chloride 0.9 % 100 mL IVPB  125 mg Intravenous Q M,W,F-HD Mauricia Area, MD   125 mg at 07/13/15 1042  . fluticasone (FLONASE) 50 MCG/ACT nasal spray 1 spray  1 spray Each Nare Daily Lavina Hamman, MD   1 spray at 07/15/15 [phone]  . gabapentin (NEURONTIN) capsule 300 mg  300 mg Oral TID Kinnie Feil, MD   300 mg at 07/15/15 [id number]  . guaiFENesin (ROBITUSSIN) 100 MG/5ML solution 100 mg  5 mL Oral Q4H PRN Lavina Hamman, MD      . HYDROcodone-acetaminophen (HYCET) 7.5-325 mg/15 ml solution 10 mL  10 mL Oral Q4H PRN Debbe Odea, MD      . HYDROmorphone (DILAUDID) injection 1 mg  1 mg Intravenous Q2H PRN Newt Minion, MD   1 mg at 07/08/15 0644  . insulin aspart (novoLOG) injection 0-15 Units  0-15 Units Subcutaneous TID WC Lavina Hamman, MD   3 Units at 07/15/15 0831  . insulin aspart (novoLOG) injection 0-5 Units  0-5 Units Subcutaneous QHS Lavina Hamman, MD   1 Units at 07/10/15 2230  . insulin aspart (novoLOG) injection 6 Units  6 Units Subcutaneous TID WC Lavina Hamman, MD   6 Units at 07/15/15 0825  . insulin glargine (LANTUS) injection 15 Units  15 Units Subcutaneous Daily Lavina Hamman, MD   15 Units  at 07/15/15 [phone]  . latanoprost (XALATAN) 0.005 % ophthalmic solution 1 drop  1 drop Both Eyes QHS Janece Canterbury, MD   1 drop at 07/14/15 2339  . metoCLOPramide (REGLAN) tablet 5-10 mg  5-10 mg Oral Q8H PRN Newt Minion, MD       Or  . metoCLOPramide (REGLAN) injection 5-10 mg  5-10 mg Intravenous Q8H PRN Newt Minion, MD      . multivitamin (RENA-VIT) tablet 1 tablet  1 tablet  Oral QHS Mauricia Area, MD   1 tablet at 07/14/15 2337  . ondansetron (ZOFRAN) tablet 4 mg  4 mg Oral Q6H PRN Newt Minion, MD       Or  . ondansetron [facility]) injection 4 mg  4 mg Intravenous Q6H PRN Newt Minion, MD      . pantoprazole (PROTONIX) EC tablet 40 mg  40 mg Oral Daily Debbe Odea, MD   40 mg at 07/15/15 [id number]  . polyethylene glycol (MIRALAX / GLYCOLAX) packet 17 g  17 g Oral Daily Lavina Hamman, MD   17 g at 07/15/15 [id number]  . rosuvastatin (CRESTOR) tablet 20 mg  20 mg Oral Daily Lavina Hamman, MD   20 mg at 07/15/15 [id number]  . senna-docusate (Senokot-S) tablet 1 tablet  1 tablet Oral BID Lavina Hamman, MD   1 tablet at 07/15/15 [id number]  . sevelamer carbonate (RENVELA) tablet 1,600 mg  1,600 mg Oral TID WC Dwana Melena, MD   1,600 mg at 07/15/15 [id number]  . sodium chloride 0.9 % injection 3 mL  3 mL Intravenous Q12H Toy Baker, MD   3 mL at 07/15/15 0903  . traZODone (DESYREL) tablet 150-300 mg  150-300 mg Oral QHS PRN Lavina Hamman, MD   150 mg at 07/12/15 2219     Discharge Medications: Please see discharge summary for a list of discharge medications.  Relevant Imaging Results:  Relevant Lab Results:   Additional Information SS#: [id number].  Dialysis Information:  Patient has been accepted at the [facility] on a Tuesday, Thursday and Saturday 2nd shift schedule. The center can begin treatment on Thursday January 26th.   Sable Feil, LCSW

## 2015-07-15 NOTE — Progress Notes (Addendum)
Patient ID: David Ford, male   DOB: 1941/03/21, 75 y.o.   MRN: [id number] S:no complaints O:BP 137/67 mmHg  Pulse 75  Temp(Src) 98.8 F (37.1 C) (Oral)  Resp 18  Ht 6\' 2"  (1.88 m)  Wt 97.8 kg (215 lb 9.8 oz)  BMI 27.67 kg/m2  SpO2 99%  Intake/Output Summary (Last 24 hours) at 07/15/15 0926 Last data filed at 07/14/15 1715  Gross per 24 hour  Intake    590 ml  Output     30 ml  Net    560 ml   Intake/Output: I/O last 3 completed shifts: In: 45 [P.O.:420; I.V.:350; IV Piggyback:110] Out: 9 [Stool:1; Blood:30]  Intake/Output this shift:    Weight change: -1.9 kg (-4 lb 3 oz) Gen:WD WN AAM in NAd CVS:no rub Resp:cta [id number] Ext:tr edema, LUE AVF +T/B   Recent Labs Lab 07/10/15 0600 07/10/15 0830 07/13/15 0651 07/14/15 0805 07/15/15 0630  NA 127* 128* 129* 133* 129*  K 4.4 4.4 4.5 4.2 4.4  CL 90* 92* 94* 95* 93*  CO2 23 25 24 26 26   GLUCOSE 168* 212* 73 102* 204*  BUN 38* 37* 42* 29* 37*  CREATININE 8.39* 8.39* 9.10* 6.95* 8.29*  ALBUMIN  --  1.7* 1.5*  --  1.6*  CALCIUM 7.8* 7.8* 7.7* 7.8* 7.7*  PHOS  --  4.6 4.5  --  4.5   Liver Function Tests:  Recent Labs Lab 07/10/15 0830 07/13/15 0651 07/15/15 0630  ALBUMIN 1.7* 1.5* 1.6*   No results for input(s): LIPASE, AMYLASE in the last 168 hours. No results for input(s): AMMONIA in the last 168 hours. CBC:  Recent Labs Lab 07/10/15 0830 07/11/15 0449 07/12/15 0730 07/13/15 0631 07/15/15 0630  WBC 6.0 5.6 7.5 7.9 6.5  HGB 7.2* 6.6* 7.5* 7.6* 7.4*  HCT 21.9* 19.9* 22.6* 22.7* 23.0*  MCV 83.6 82.9 83.1 81.4 84.9  PLT 311 328 349 356 334   Cardiac Enzymes: No results for input(s): CKTOTAL, CKMB, CKMBINDEX, TROPONINI in the last 168 hours. CBG:  Recent Labs Lab 07/14/15 0814 07/14/15 1237 07/14/15 1717 07/14/15 2123 07/15/15 0758  GLUCAP 98 123* 224* 178* 196*    Iron Studies: No results for input(s): IRON, TIBC, TRANSFERRIN, FERRITIN in the last 72 hours. Studies/Results: No  results found. . sodium chloride   Intravenous Once  . antiseptic oral rinse  7 mL Mouth Rinse q12n4p  . brimonidine  1 drop Both Eyes BID  . calcitRIOL  0.5 mcg Oral Q M,W,F-HD  . carvedilol  3.125 mg Oral BID WC  . cyclobenzaprine  7.5 mg Oral TID  . darbepoetin (ARANESP) injection - DIALYSIS  200 mcg Intravenous Q Fri-HD  . diclofenac sodium  2 g Topical QID  . docusate sodium  200 mg Oral Daily  . feeding supplement (NEPRO CARB STEADY)  237 mL Oral Q1500  . feeding supplement (PRO-STAT SUGAR FREE 64)  30 mL Oral Daily  . ferric gluconate (FERRLECIT/NULECIT) IV  125 mg Intravenous Q M,W,F-HD  . fluticasone  1 spray Each Nare Daily  . gabapentin  300 mg Oral TID  . insulin aspart  0-15 Units Subcutaneous TID WC  . insulin aspart  0-5 Units Subcutaneous QHS  . insulin aspart  6 Units Subcutaneous TID WC  . insulin glargine  15 Units Subcutaneous Daily  . latanoprost  1 drop Both Eyes QHS  . multivitamin  1 tablet Oral QHS  . pantoprazole  40 mg Oral Daily  . polyethylene glycol  17 g Oral  Daily  . rosuvastatin  20 mg Oral Daily  . senna-docusate  1 tablet Oral BID  . sevelamer carbonate  1,600 mg Oral TID WC  . sodium chloride  3 mL Intravenous Q12H    BMET    Component Value Date/Time   NA 129* 07/15/2015 0630   K 4.4 07/15/2015 0630   CL 93* 07/15/2015 0630   CO2 26 07/15/2015 0630   GLUCOSE 204* 07/15/2015 0630   BUN 37* 07/15/2015 0630   CREATININE 8.29* 07/15/2015 0630   CALCIUM 7.7* 07/15/2015 0630   CALCIUM 7.5* 07/08/2015 0808   GFRNONAA 6* 07/15/2015 0630   GFRAA 7* 07/15/2015 0630   CBC    Component Value Date/Time   WBC 6.5 07/15/2015 0630   RBC 2.71* 07/15/2015 0630   HGB 7.4* 07/15/2015 0630   HCT 23.0* 07/15/2015 0630   PLT 334 07/15/2015 0630   MCV 84.9 07/15/2015 0630   MCH 27.3 07/15/2015 0630   MCHC 32.2 07/15/2015 0630   RDW 14.7 07/15/2015 0630   LYMPHSABS 1.9 06/25/2015 0248   MONOABS 1.4* 06/25/2015 0248   EOSABS 0.2 06/25/2015 0248    BASOSABS 0.2* 06/25/2015 0248    Assessment/Plan:  1. AKI/CKD now approaching ESRD . First HD on 06/20/15 and has remained anuric. Multiple renal insults including ischemic ATN in setting of partial SBO, rhabdo, and underlying CKD due to DM and HTN.  1. Has outpatient HD spot TTS schedule at the [facility] clinic tomorrow. 2. Plan for next HD session on 07/16/15 as an outpatient. 3. Will need to renal dose meds:  Recommend decreasing gabapentin to 300 mg qhs and stop flexeril. 2. Vascular access s/p LUE BC AVF placement by Dr. Oneida Alar 07/14/15.  3. HTN controlled 4. Anemai stable on Fe, esa 5. SHPTH follow Ca/Phos/iPTH vit D 6. DM controlled 7. Diabetic foot ulcer, PVD s/p 1st and 2nd ray amputation on 07/03/15 per ortho 8. Debilitation- await SNF placement  [place]

## 2015-07-15 NOTE — Progress Notes (Signed)
Called to give report to staff of [employer]. The call was transferred twice and on the second time this RN was placed on hold for approximately 5 min. Will try to call and give report again later.

## 2015-07-15 NOTE — Progress Notes (Addendum)
[facility] Progress Note  Subjective  - POD #1  Denies any left hand or arm pain  Objective Filed Vitals:   07/15/15 0418 07/15/15 0856  BP: 134/56 137/67  Pulse: 82 75  Temp: 99.4 F (37.4 C) 98.8 F (37.1 C)  Resp: 18 18    Intake/Output Summary (Last 24 hours) at 07/15/15 1201 Last data filed at 07/14/15 1715  Gross per 24 hour  Intake    340 ml  Output     30 ml  Net    310 ml    2+ left radial pulse. Hand is warm and pink. Left upper arm incision clean. Audible bruit left upper arm.   Assessment/Planning: 75 y.o. male is s/p: left brachial vein transposition 1st stage.  1 Day Post-Op   No signs of steal syndrome Fistula is patent.  F/u with Dr. Bridgett Larsson in 6 weeks to check maturation of AVF. Will sign off.  Alvia Grove 07/15/2015 12:01 PM --  Laboratory CBC    Component Value Date/Time   WBC 6.5 07/15/2015 0630   HGB 7.4* 07/15/2015 0630   HCT 23.0* 07/15/2015 0630   PLT 334 07/15/2015 0630    BMET    Component Value Date/Time   NA 129* 07/15/2015 0630   K 4.4 07/15/2015 0630   CL 93* 07/15/2015 0630   CO2 26 07/15/2015 0630   GLUCOSE 204* 07/15/2015 0630   BUN 37* 07/15/2015 0630   CREATININE 8.29* 07/15/2015 0630   CALCIUM 7.7* 07/15/2015 0630   CALCIUM 7.5* 07/08/2015 0808   GFRNONAA 6* 07/15/2015 0630   GFRAA 7* 07/15/2015 0630    COAG Lab Results  Component Value Date   INR 1.30 07/06/2015   INR 1.29 06/29/2015   No results found for: PTT  Antibiotics Anti-infectives    Start     Dose/Rate Route Frequency Ordered Stop   07/06/15 0830  ceFAZolin (ANCEF) IVPB 2 g/50 mL premix     2 g 100 mL/hr over 30 Minutes Intravenous To Radiology 07/06/15 0817 07/06/15 0945   07/04/15 0100  ceFAZolin (ANCEF) IVPB 1 g/50 mL premix     1 g 100 mL/hr over 30 Minutes Intravenous Every 6 hours 07/03/15 2051 07/04/15 1758   06/28/15 2200  piperacillin-tazobactam (ZOSYN) IVPB 2.25 g     2.25 g 100 mL/hr over 30 Minutes  Intravenous 3 times per day 06/28/15 1737 07/02/15 0559   06/28/15 1945  piperacillin-tazobactam (ZOSYN) IVPB 2.25 g  Status:  Discontinued     2.25 g 100 mL/hr over 30 Minutes Intravenous Every 6 hours 06/28/15 1736 06/28/15 1737   06/23/15 0100  piperacillin-tazobactam (ZOSYN) IVPB 2.25 g  Status:  Discontinued     2.25 g 100 mL/hr over 30 Minutes Intravenous Every 8 hours 06/22/15 1912 06/28/15 1735   06/22/15 1600  vancomycin (VANCOCIN) IVPB 1000 mg/200 mL premix     1,000 mg 200 mL/hr over 60 Minutes Intravenous  Once 06/22/15 1448 06/22/15 1730   06/20/15 1400  vancomycin (VANCOCIN) IVPB 1000 mg/200 mL premix     1,000 mg 200 mL/hr over 60 Minutes Intravenous  Once 06/20/15 1205 06/20/15 1538   06/19/15 2100  vancomycin (VANCOCIN) IVPB 1000 mg/200 mL premix  Status:  Discontinued     1,000 mg 200 mL/hr over 60 Minutes Intravenous Every 48 hours 06/17/15 2214 06/18/15 0945   06/18/15 1200  vancomycin (VANCOCIN) IVPB 1000 mg/200 mL premix  Status:  Discontinued     1,000 mg 200 mL/hr over  60 Minutes Intravenous Every 48 hours 06/18/15 0945 06/20/15 1204   06/18/15 0530  piperacillin-tazobactam (ZOSYN) IVPB 2.25 g  Status:  Discontinued     2.25 g 100 mL/hr over 30 Minutes Intravenous Every 8 hours 06/17/15 2214 06/22/15 1912   06/17/15 2115  piperacillin-tazobactam (ZOSYN) IVPB 3.375 g     3.375 g 100 mL/hr over 30 Minutes Intravenous  Once 06/17/15 2111 06/17/15 2151   06/17/15 2115  vancomycin (VANCOCIN) IVPB 1000 mg/200 mL premix     1,000 mg 200 mL/hr over 60 Minutes Intravenous  Once 06/17/15 2111 06/18/15 0235       Virgina Jock, PA-C [facility] Office: [phone] Pager: [phone] 07/15/2015 12:01 PM   Addendum  Follow up in clinic in 6 weeks for check on maturation.  Adele Barthel, MD [facility] [place] Office: [phone] Pager: [phone]  07/15/2015, 5:55 PM

## 2015-07-15 NOTE — Progress Notes (Signed)
Second attempt: This Probation officer called [facility] to give report on this pt before he is transported to their facility. The call was answered, and this RN was placed on hold for approx 5 min. [facility] has been called and pt will transfer to [facility] when transportation arrives.

## 2015-07-16 NOTE — Progress Notes (Signed)
This patient was discharged by Dr. Coralyn Pear on 07/15/2015. Today, I was asked by 6 E charge nurse to complete a new prescription for insulin glargine (Lantus) as per [employer] request (came by fax). Reviewed D. Zamora's orders and completed prescription for same. I have not been involved in the care of of this patient but completed above paperwork as covering physician.  Vernell Leep, MD, FACP, FHM. [facility] Pager [phone]  If 7PM-7AM, please contact night-coverage [email or web address] Password [facility] 07/16/2015, 3:40 PM

## 2015-07-17 ENCOUNTER — Telehealth: Payer: Self-pay | Admitting: Vascular Surgery

## 2015-07-17 NOTE — Telephone Encounter (Signed)
LM for pt re appt, dpm °

## 2015-07-17 NOTE — Telephone Encounter (Signed)
-----   Message from Gena Fray sent at 07/16/2015  2:30 PM EST ----- Regarding: FW: schedule   ----- Message -----    From: Mena Goes, RN    Sent: 07/14/2015   1:59 PM      To: [facility] Admin Pool Subject: schedule                                         ----- Message -----    From: Elam Dutch, MD    Sent: 07/14/2015  12:21 PM      To: [facility] Charge Pool  Left first stage brachial vein to brachial artery fistula [place] arm Samantha asst  Dr Bridgett Larsson said he would see the pt back in 6 weeks to consider 2nd stage.  Pt needs [place] of fistula at that visit  Thanks  Ruta Hinds

## 2015-07-25 ENCOUNTER — Emergency Department (HOSPITAL_COMMUNITY)
Admission: EM | Admit: 2015-07-25 | Discharge: 2015-07-25 | Disposition: A | Discharge status: Home / Self Care | Payer: Medicare HMO | Attending: Emergency Medicine | Admitting: Emergency Medicine

## 2015-07-25 ENCOUNTER — Emergency Department (HOSPITAL_COMMUNITY): Payer: Medicare HMO

## 2015-07-25 ENCOUNTER — Encounter (HOSPITAL_COMMUNITY): Payer: Self-pay | Admitting: *Deleted

## 2015-07-25 DIAGNOSIS — T82898A Other specified complication of vascular prosthetic devices, implants and grafts, initial encounter: Secondary | ICD-10-CM | POA: Diagnosis not present

## 2015-07-25 DIAGNOSIS — Z8546 Personal history of malignant neoplasm of prostate: Secondary | ICD-10-CM | POA: Diagnosis not present

## 2015-07-25 DIAGNOSIS — I10 Essential (primary) hypertension: Secondary | ICD-10-CM | POA: Diagnosis not present

## 2015-07-25 DIAGNOSIS — Z87448 Personal history of other diseases of urinary system: Secondary | ICD-10-CM | POA: Insufficient documentation

## 2015-07-25 DIAGNOSIS — Z7951 Long term (current) use of inhaled steroids: Secondary | ICD-10-CM | POA: Insufficient documentation

## 2015-07-25 DIAGNOSIS — Z794 Long term (current) use of insulin: Secondary | ICD-10-CM | POA: Diagnosis not present

## 2015-07-25 DIAGNOSIS — Y828 Other medical devices associated with adverse incidents: Secondary | ICD-10-CM | POA: Diagnosis not present

## 2015-07-25 DIAGNOSIS — Z79899 Other long term (current) drug therapy: Secondary | ICD-10-CM | POA: Insufficient documentation

## 2015-07-25 DIAGNOSIS — E119 Type 2 diabetes mellitus without complications: Secondary | ICD-10-CM | POA: Diagnosis not present

## 2015-07-25 DIAGNOSIS — T8241XA Breakdown (mechanical) of vascular dialysis catheter, initial encounter: Secondary | ICD-10-CM

## 2015-07-25 DIAGNOSIS — Z791 Long term (current) use of non-steroidal anti-inflammatories (NSAID): Secondary | ICD-10-CM | POA: Diagnosis not present

## 2015-07-25 DIAGNOSIS — T82518A Breakdown (mechanical) of other cardiac and vascular devices and implants, initial encounter: Secondary | ICD-10-CM

## 2015-07-25 LAB — CBC
HCT: 24.9 % — ABNORMAL LOW (ref 39.0–52.0)
HEMOGLOBIN: 7.7 g/dL — AB (ref 13.0–17.0)
MCH: 26.6 pg (ref 26.0–34.0)
MCHC: 30.9 g/dL (ref 30.0–36.0)
MCV: 86.2 fL (ref 78.0–100.0)
Platelets: 292 10*3/uL (ref 150–400)
RBC: 2.89 MIL/uL — AB (ref 4.22–5.81)
RDW: 15.2 % (ref 11.5–15.5)
WBC: 10.2 10*3/uL (ref 4.0–10.5)

## 2015-07-25 LAB — BASIC METABOLIC PANEL
ANION GAP: 15 (ref 5–15)
BUN: <5 mg/dL — ABNORMAL LOW (ref 6–20)
CHLORIDE: 96 mmol/L — AB (ref 101–111)
CO2: 28 mmol/L (ref 22–32)
CREATININE: 1.88 mg/dL — AB (ref 0.61–1.24)
Calcium: 8.2 mg/dL — ABNORMAL LOW (ref 8.9–10.3)
GFR calc non Af Amer: 34 mL/min — ABNORMAL LOW (ref >60)
GFR, EST AFRICAN AMERICAN: 39 mL/min — AB (ref >60)
Glucose, Bld: 100 mg/dL — ABNORMAL HIGH (ref 65–99)
POTASSIUM: 3.1 mmol/L — AB (ref 3.5–5.1)
SODIUM: 139 mmol/L (ref 135–145)

## 2015-07-25 LAB — SAMPLE TO BLOOD BANK

## 2015-07-25 NOTE — ED Notes (Signed)
Secretary to call [facility] for transport of pt to [facility]

## 2015-07-25 NOTE — ED Provider Notes (Signed)
CSN: [id number]     Arrival date & time 07/25/15  1646 History   First MD Initiated Contact with Patient 07/25/15 1648     Chief Complaint  Patient presents with  . Vascular Access Problem     (Consider location/radiation/quality/duration/timing/severity/associated sxs/prior Treatment) HPI  80 y m esrd on HD t/t/s through R sided subclav permcath who comes in from HD bc catheter pulled away from skin.  No other problems.  Patient reports the catheter was placed approx one month ago.  He also has a L sided AV fistula which hasn't been used yet.  Past Medical History  Diagnosis Date  . Diabetes mellitus without complication ([place])   . Hypertension   . DKA (diabetic ketoacidoses) ([place]) 06/18/2015  . AKI (acute kidney injury) ([place]) 06/18/2015  . Cancer ([place])     hx of prostate cancer   Past Surgical History  Procedure Laterality Date  . Prostate seeds    . Amputation Left 07/03/2015    Procedure: Left Foot 1st and 2nd Ray Amputation;  Surgeon: Newt Minion, MD;  Location: [facility];  Service: Orthopedics;  Laterality: Left;  . Bascilic vein transposition Left 07/14/2015    Procedure: BRACHIOBASILIC VEIN TRANSPOSITION  ;  Surgeon: Elam Dutch, MD;  Location: [facility] OR;  Service: Vascular;  Laterality: Left;   Family History  Problem Relation Age of Onset  . Diabetes Mother   . Diabetes Sister   . Diabetes Brother    Social History  Substance Use Topics  . Smoking status: Never Smoker   . Smokeless tobacco: Never Used  . Alcohol Use: Yes     Comment: once a week    Review of Systems  Constitutional: Negative for fever and chills.  Eyes: Negative for redness.  Respiratory: Negative for cough and shortness of breath.   Cardiovascular: Negative for chest pain.  Gastrointestinal: Negative for nausea, vomiting, abdominal pain and diarrhea.  Genitourinary: Negative for dysuria.  Skin: Negative for rash.  Neurological: Negative for headaches.  All other systems reviewed and are  negative.     Allergies  Review of patient's allergies indicates no known allergies.  Home Medications   Prior to Admission medications   Medication Sig Start Date End Date Taking? Authorizing Provider  brimonidine (ALPHAGAN P) 0.1 % SOLN Place 1 drop into both eyes 2 (two) times daily.    Historical Provider, MD  calcitRIOL (ROCALTROL) 0.5 MCG capsule Take 1 capsule (0.5 mcg total) by mouth every Monday, Wednesday, and Friday with hemodialysis. 07/15/15   Kelvin Cellar, MD  carvedilol (COREG) 6.25 MG tablet Take 6.25 mg by mouth 2 (two) times daily with a meal.    Historical Provider, MD  diclofenac sodium (VOLTAREN) 1 % GEL Apply 2 g topically 4 (four) times daily. 07/15/15   Kelvin Cellar, MD  docusate sodium (COLACE) 100 MG capsule Take 2 capsules (200 mg total) by mouth daily. 07/15/15   Kelvin Cellar, MD  ezetimibe (ZETIA) 10 MG tablet Take 10 mg by mouth daily.    Historical Provider, MD  fluticasone (FLONASE) 50 MCG/ACT nasal spray Place 1 spray into both nostrils daily.    Historical Provider, MD  gabapentin (NEURONTIN) 300 MG capsule Take 1 capsule (300 mg total) by mouth at bedtime. 07/15/15   Kelvin Cellar, MD  insulin aspart (NOVOLOG) 100 UNIT/ML injection Inject 6 Units into the skin 3 (three) times daily with meals. 07/15/15   Kelvin Cellar, MD  insulin glargine (LANTUS) 100 UNIT/ML injection Inject 0.15 mLs (15  Units total) into the skin daily. 07/15/15   Kelvin Cellar, MD  latanoprost (XALATAN) 0.005 % ophthalmic solution Place 1 drop into both eyes at bedtime. 07/15/15   Kelvin Cellar, MD  oxyCODONE (ROXICODONE) 5 MG immediate release tablet Take 1 tablet (5 mg total) by mouth every 6 (six) hours as needed for severe pain. 07/15/15   Kelvin Cellar, MD  polyethylene glycol (MIRALAX / GLYCOLAX) packet Take 17 g by mouth daily. 07/15/15   Kelvin Cellar, MD  rosuvastatin (CRESTOR) 20 MG tablet Take 20 mg by mouth daily.    Historical Provider, MD  sevelamer carbonate  (RENVELA) 800 MG tablet Take 2 tablets (1,600 mg total) by mouth 3 (three) times daily with meals. 07/15/15   Kelvin Cellar, MD  trazodone (DESYREL) 300 MG tablet Take 300 mg by mouth at bedtime.    Historical Provider, MD   BP 182/83 mmHg  Pulse 73  Temp(Src) 98.8 F (37.1 C) (Oral)  Resp 16  Ht 6\' 2"  (1.88 m)  Wt 97.523 kg  BMI 27.59 kg/m2  SpO2 99% Physical Exam  Constitutional: He is oriented to person, place, and time. No distress.  HENT:  Head: Normocephalic and atraumatic.  Eyes: EOM are normal. Pupils are equal, round, and reactive to light.  Neck: Normal range of motion. Neck supple.  Cardiovascular: Normal rate.   There is a R sided permcath that is in place.  The permcath site is c/d/i.  There are retention sutures in place.    Pulmonary/Chest: Effort normal. No respiratory distress.  Abdominal: Soft. There is no tenderness.  Musculoskeletal: Normal range of motion.  Neurological: He is alert and oriented to person, place, and time.  Skin: No rash noted. He is not diaphoretic.  Psychiatric: He has a normal mood and affect.    ED Course  Procedures (including critical care time) Labs Review Labs Reviewed  CBC - Abnormal; Notable for the following:    RBC 2.89 (*)    Hemoglobin 7.7 (*)    HCT 24.9 (*)    All other components within normal limits  BASIC METABOLIC PANEL - Abnormal; Notable for the following:    Potassium 3.1 (*)    Chloride 96 (*)    Glucose, Bld 100 (*)    BUN <5 (*)    Creatinine, Ser 1.88 (*)    Calcium 8.2 (*)    GFR calc non Af Amer 34 (*)    GFR calc Af Amer 39 (*)    All other components within normal limits  SAMPLE TO BLOOD BANK    Imaging Review Dg Chest 1 View  07/25/2015  CLINICAL DATA:  PermCath placement EXAM: CHEST 1 VIEW COMPARISON:  07/10/2015 FINDINGS: Right IJ catheter tip at the cavoatrial junction unchanged from prior study. Lungs remain clear without infiltrate or effusion.  No edema. IMPRESSION: PermCath in the SVC  unchanged from the prior study. No superimposed acute cardiopulmonary abnormality. Electronically Signed   By: Franchot Gallo M.D.   On: 07/25/2015 18:00   I have personally reviewed and evaluated these images and lab results as part of my medical decision-making.   EKG Interpretation   Date/Time:  Saturday July 25 2015 16:51:14 EST Ventricular Rate:  85 PR Interval:  54 QRS Duration: 150 QT Interval:  515 QTC Calculation: 612 R Axis:   -15 Text Interpretation:  Sinus rhythm Atrial premature complexes in couplets  Short PR interval Left bundle branch block Confirmed by ZAVITZ  MD, JOSHUA  ([id number]) on 07/25/2015 5:16:59 PM  MDM   Final diagnoses:  Vascular catheter dysfunction, initial encounter ([place])    32 y m esrd on HD t/t/s through R sided subclav permcath who comes in from HD bc catheter pulled away from skin.  Exam as above.  The permcath seems to be in appropriate position and retention sutures are in place.  Cbc/bmp obtained and unremarkable other than k of 3.1.  Given patient just dialyzed today, will hold off on repleting K as it will increase this week.  cxr obtained and shows good position of the catheter, will d/c  I have discussed the results, Dx and Tx plan with the pt. They expressed understanding and agree with the plan and were told to return to ED with any worsening of condition or concern.    Disposition: Discharge  Condition: Good  New Prescriptions   No medications on file    Follow Up: [place] [address] [id number] [place] [phone]     Pt seen in conjunction with Dr. Johnney Ou, MD 07/25/15 1847  Elnora Morrison, MD 07/25/15 [phone]

## 2015-07-25 NOTE — ED Notes (Signed)
The pt has a wooden shoe on his rt foot  He reports that he has surgery on that foot last Friday  He could not tell me  What type procedure he had.  He is a diabetic  Alert skin warm and dry

## 2015-07-25 NOTE — ED Notes (Signed)
Safety sitter with the pt

## 2015-07-25 NOTE — ED Notes (Signed)
The pt has a new fistula in his lt arm  But i has not been used just yet

## 2015-07-25 NOTE — ED Notes (Signed)
The pt  Is sitting in a chair at his bedside.  He answers some questions but not others.  He was sitting on the trash can  But had no explanations of why.  He reports that he has no family in [place] but there are 3 people listed in his chart  i have made several attempts to call them  Left messages no response.  The pt appears

## 2015-07-25 NOTE — ED Notes (Signed)
Placed sterile guaze over L arm incision, edges approximated, scab in place, no peri redness present, guaze secured with medipore tape

## 2015-07-25 NOTE — ED Notes (Signed)
Patient undressed, in gown, on monitor, continuous pulse oximetry and blood pressure cuff 

## 2015-07-25 NOTE — Discharge Instructions (Signed)
Dialysis Vascular Access Malfunction °A vascular access is an entrance to your blood vessels that can be used for dialysis. A vascular access can be made in one of several ways:  °· Joining an artery to a vein under your skin to make a bigger blood vessel called a fistula.   °· Joining an artery to a vein under your skin using a soft tube called a graft.   °· Placing a thin, flexible tube (catheter) in a large vein, usually in your neck.   °A vascular access may malfunction or become blocked.  °WHAT CAN CAUSE YOUR VASCULAR ACCESS TO MALFUNCTION? °· Infection (common).   °· A blood clot inside a part of the fistula, graft, or catheter. A blood clot can completely or partially block the flow of blood.   °· A kink in the graft or catheter.   °· A collection of blood (called a hematoma or bruise) next to the graft or catheter that pushes against it, blocking the flow of blood.   °WHAT ARE SIGNS AND SYMPTOMS OF VASCULAR ACCESS MALFUNCTION? °· There is a change in the vibration or pulse of your fistula or graft. °· The vibration or pulse of your fistula or graft is gone.   °· There is new or unusual swelling of the area around the access.   °· There was an unsuccessful puncture of your access by the dialysis team.   °· The flow of blood through the fistula, graft, or catheter is too slow for effective dialysis.   °· When routine dialysis is completed and the needle is removed, bleeding lasts for too long a time.   °WHAT HAPPENS IF MY VASCULAR ACCESS MALFUNCTIONS? °Your health care provider may order blood work, cultures, or an X-ray test in order to learn what may be wrong with your vascular access. The X-ray test involves the injection of a liquid into the vascular access. The liquid shows up on the X-ray and allows your health care provider to see if there is a blockage in the vascular access.  °Treatment varies depending on the cause of the malfunction:   °· If the vascular access is infected, your health care provider  may prescribe antibiotic medicine to control the infection.   °· If a clot is found in the vascular access, you may need surgery to remove the clot.   °· If a blockage in the vascular access is due to some other cause (such as a kink in a graft), then you will likely need surgery to unblock or replace the graft.   °HOME CARE INSTRUCTIONS: °Follow up with your surgeon or other health care provider if you were instructed to do so. This is very important. Any delay in follow-up could cause permanent dysfunction of the vascular access, which may be dangerous.  °SEEK MEDICAL CARE IF:  °· Fever develops.   °· Swelling and pain around the vascular access gets worse or new pain develops. °· Pain, numbness, or an unusual pale skin color develops in the hand on the side of your vascular access. °SEEK IMMEDIATE MEDICAL CARE IF: °Unusual bleeding develops at the location of the vascular access. °MAKE SURE YOU: °· Understand these instructions. °· Will watch your condition. °· Will get help right away if you are not doing well or get worse. °  °This information is not intended to replace advice given to you by your health care provider. Make sure you discuss any questions you have with your health care provider. °  °Document Released: 05/09/2006 Document Revised: 06/27/2014 Document Reviewed: 11/08/2012 °Elsevier Interactive Patient Education ©2016 [employer]vier Inc. ° °

## 2015-07-25 NOTE — ED Notes (Signed)
Report called to [facility] for a transfer back there

## 2015-07-25 NOTE — ED Notes (Signed)
Patient was sitting on edge of stretcher; myself and [name], RN present in room; got patient readjusted and situated on stretcher; placed blood pressure cuff back on his arm; placed blankets on patient and now patient is resting at this time

## 2015-07-25 NOTE — ED Notes (Signed)
The  Daughter called back and she reports that his mind has been going over the months.  The daughter will come in a little later.  He has been living alone but they are discussing nursing home placement

## 2015-07-25 NOTE — ED Notes (Signed)
Pt on stool in room, side rails remain up, pt appears confused, pt placed in bedside chair & encouraged to use call bell if he needs to get up, pt verbalizes understanding of plan

## 2015-07-25 NOTE — ED Notes (Signed)
The pt  Was at the [address] dialysis center receiving dialysis when the dialysis catheter at the skin level  Came off ????.   His 383ml of blood was still in the machine and was not able to be given back to the pt.  Alert  No distress c/o being cold he came in with many blankets

## 2015-08-28 ENCOUNTER — Encounter: Payer: Medicare HMO | Admitting: Vascular Surgery

## 2015-08-28 ENCOUNTER — Encounter (HOSPITAL_COMMUNITY): Payer: Medicare HMO

## 2015-08-31 ENCOUNTER — Encounter: Payer: Self-pay | Admitting: Vascular Surgery

## 2015-09-03 NOTE — Progress Notes (Signed)
    Postoperative Access Visit   History of Present Illness  David Ford is a 75 y.o. year old male who presents for postoperative follow-up for: L 1st stage BRVT by Dr. Oneida Alar (Date: 07/14/15).  The patient's wounds are healed.  The patient notes no steal symptoms.  The patient is able to complete their activities of daily living.  The patient's current symptoms are: none.  This patient is now off HD.  For VQI Use Only  PRE-ADM LIVING: Home  AMB STATUS: Ambulatory  Physical Examination Filed Vitals:   09/04/15 0957 09/04/15 0959  BP: 151/73 150/74  Pulse: 66     LUE: Incision is healed, skin feels warm, hand grip is 5/5, sensation in digits is intact, non-palpable thrill, bruit can be auscultated but extremely weak  Medical Decision Making  David Ford is a 75 y.o. year old male who presents s/p failing L 1st BRVT, chronic kidney disease stage ?  .  L 1st stage BRVT is failing so next access is probably a LUA AVG.  As the patient is off HD at this point, he can be scheduled if needed within one month of needing HD.  Thank you for allowing [place] to participate in this patient's care.  Adele Barthel, MD [facility] [place] Office: [phone] Pager: [phone]  09/03/2015, 8:25 AM

## 2015-09-04 ENCOUNTER — Encounter: Payer: Self-pay | Admitting: Vascular Surgery

## 2015-09-04 ENCOUNTER — Ambulatory Visit (INDEPENDENT_AMBULATORY_CARE_PROVIDER_SITE_OTHER): Payer: Medicare HMO | Admitting: Vascular Surgery

## 2015-09-04 VITALS — BP 150/74 | HR 66 | Ht 74.0 in | Wt 194.1 lb

## 2015-09-04 DIAGNOSIS — N178 Other acute kidney failure: Secondary | ICD-10-CM

## 2015-09-17 LAB — BASIC METABOLIC PANEL
CREATININE: 1.7 — AB (ref 0.6–1.3)
Glucose: 162
POTASSIUM: 5.4 — AB (ref 3.4–5.3)

## 2015-09-17 LAB — LIPID PANEL
Cholesterol: 128 (ref 0–200)
HDL: 49 (ref 35–70)
LDL CALC: 56
Triglycerides: 117 (ref 40–160)

## 2015-09-17 LAB — HM DIABETES FOOT EXAM

## 2015-09-17 LAB — HEMOGLOBIN A1C: HEMOGLOBIN A1C: 6.8

## 2015-12-28 LAB — LIPID PANEL
Cholesterol: 158 (ref 0–200)
HDL: 47 (ref 35–70)
LDL CALC: 90
TRIGLYCERIDES: 106 (ref 40–160)

## 2015-12-28 LAB — BASIC METABOLIC PANEL
BUN: 36 — AB (ref 4–21)
CREATININE: 1.9 — AB (ref 0.6–1.3)
Glucose: 178
Potassium: 6.6 — AB (ref 3.4–5.3)
Sodium: 137 (ref 137–147)

## 2015-12-28 LAB — HEMOGLOBIN A1C: Hemoglobin A1C: 8.5

## 2016-01-31 IMAGING — CT CT ABD-PELV W/O CM
2 of 4 series · 15 of 46 positions shown, 17 images · non-contrast
Comparison: 06/19/2015 plain films

CLINICAL DATA: 74 BEBETO PRESENTS WITH SMALL BOWEL OBSTRUCTION

EXAM:
CT ABDOMEN AND PELVIS WITHOUT CONTRAST
TECHNIQUE: Multidetector CT imaging of the abdomen and pelvis was performed
following the standard protocol without IV contrast.

[Series 2: abd/ pelvis 5.0 i30f 1 · axial · 0.85mm/px · z∈[+803,+1233]mm · 12 of 102 slices shown, 14 images]
[im 8/102  soft-tissue]
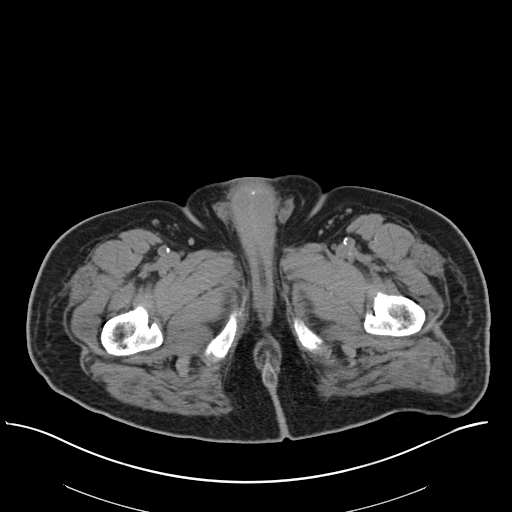
[im 8/102  bone]
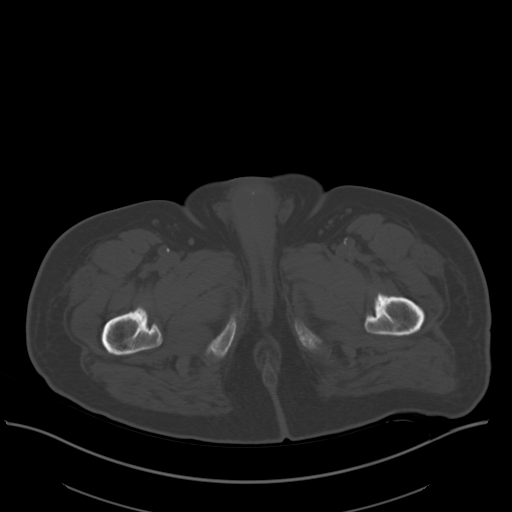
[im 15/102  soft-tissue]
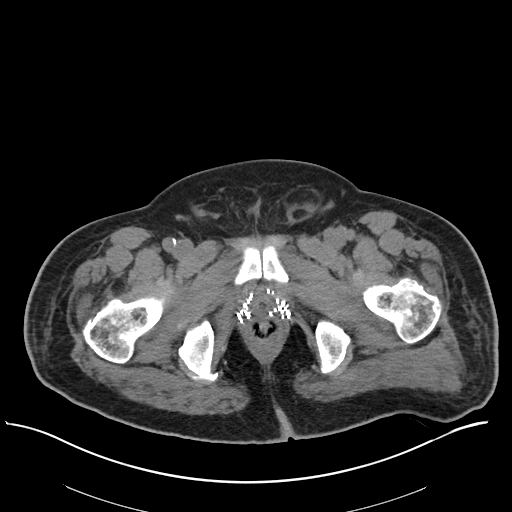
[im 23/102  soft-tissue]
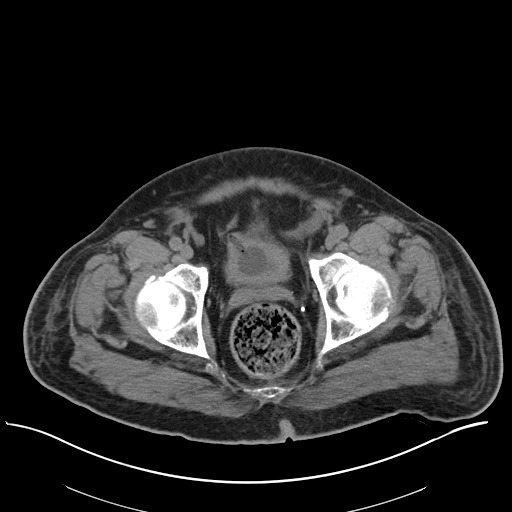
[im 30/102  soft-tissue]
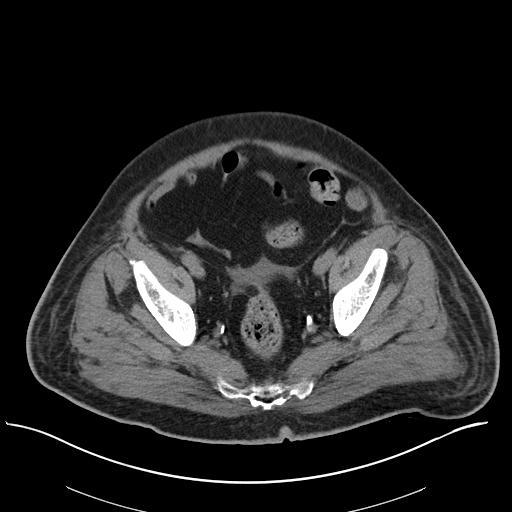
[im 38/102  soft-tissue]
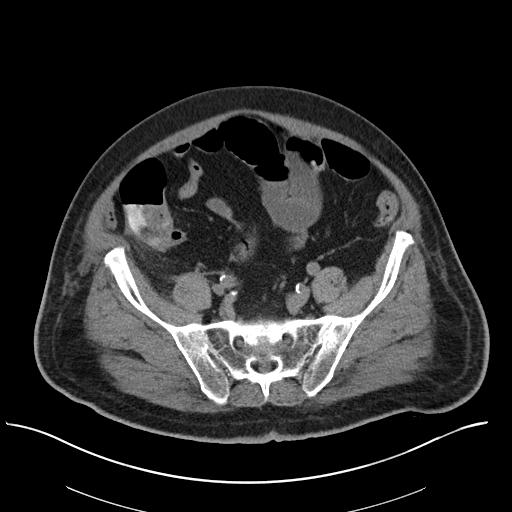
[im 45/102  soft-tissue]
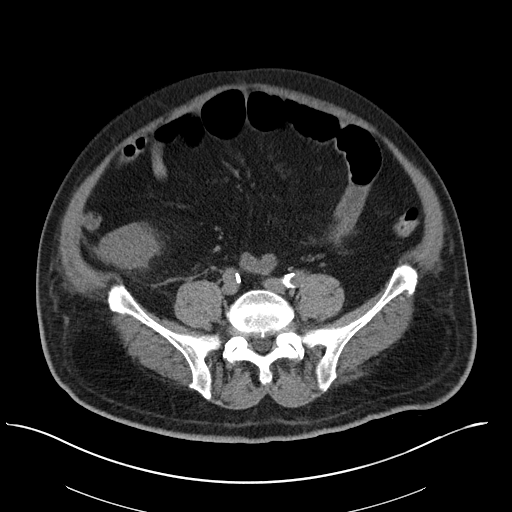
[im 57/102  soft-tissue]
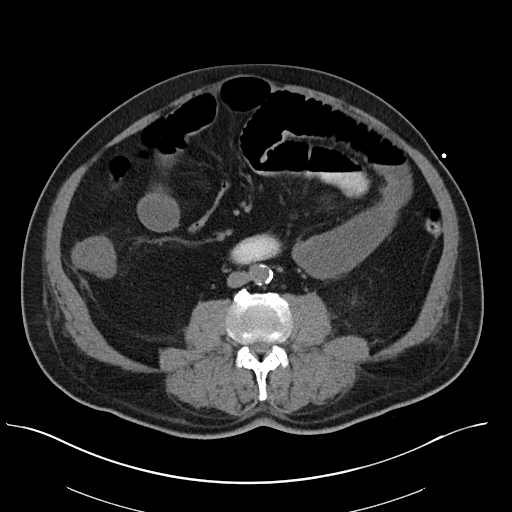
[im 64/102  soft-tissue]
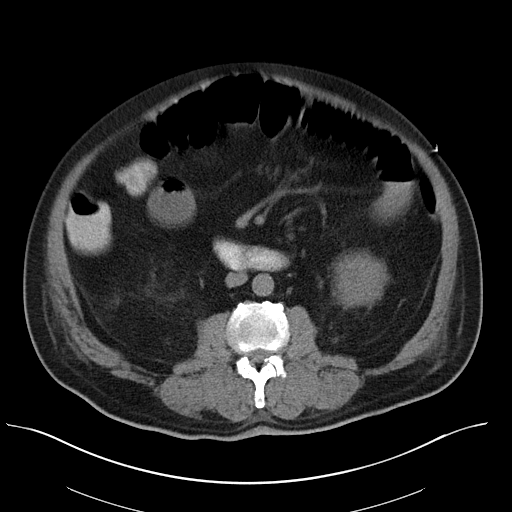
[im 72/102  soft-tissue]
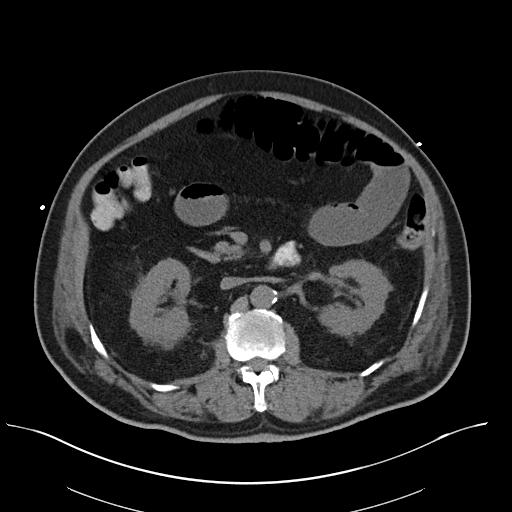
[im 72/102  bone]
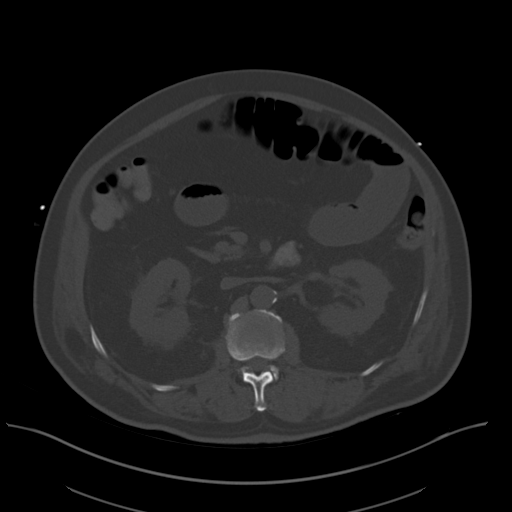
[im 79/102  soft-tissue]
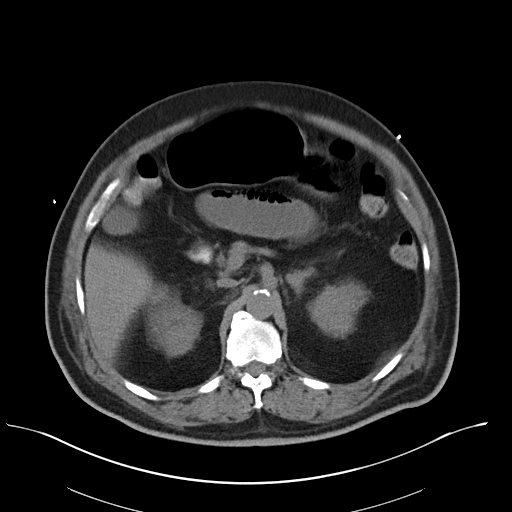
[im 87/102  soft-tissue]
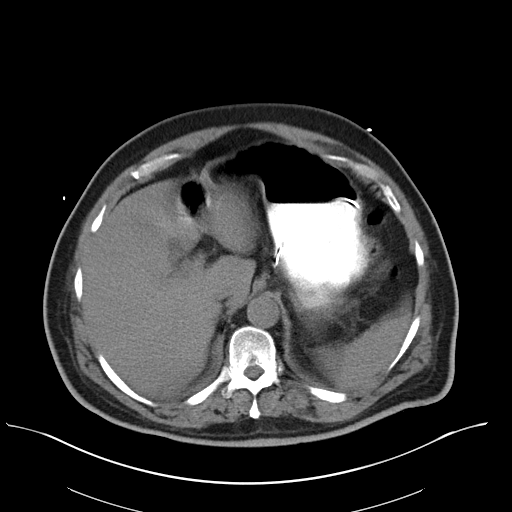
[im 94/102  soft-tissue]
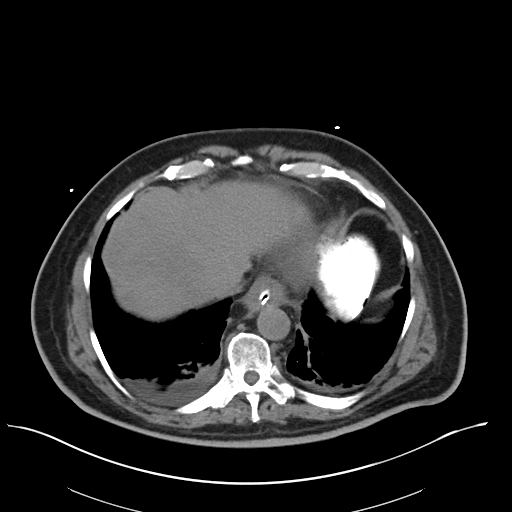

[Series 5: cor st · coronal · 0.84mm/px · 3 of 103 slices shown]
[im 35/103  soft-tissue]
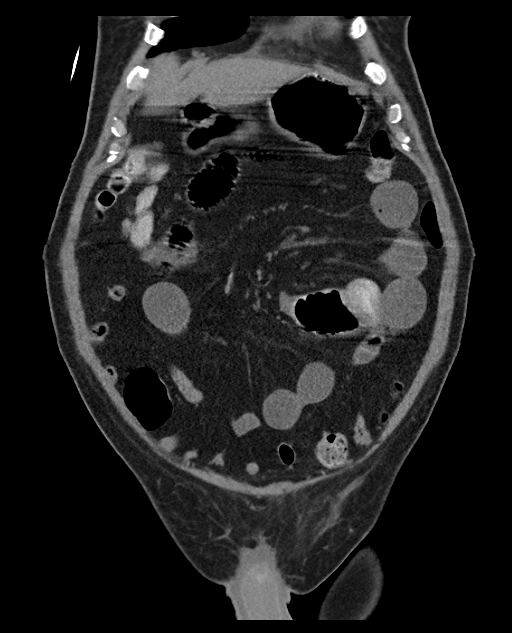
[im 46/103  soft-tissue]
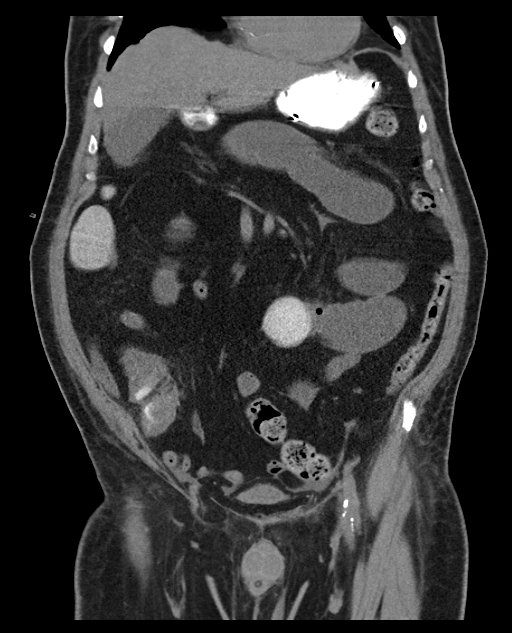
[im 57/103  soft-tissue]
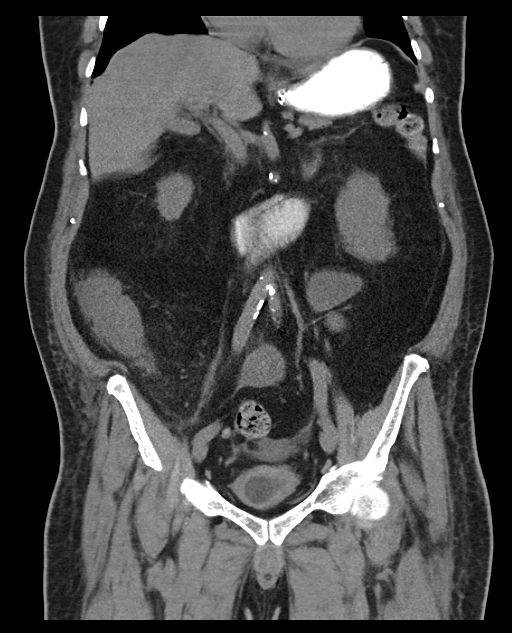

[15 of 46 positions shown; findings below may reference images not displayed]

FINDINGS: Lower chest: Bilateral small pleural effusions and bibasilar
atelectasis. Nasogastric tube in place. Hiatal hernia noted.

Upper abdomen: Scalloped contour of the liver noted. No focal
abnormality identified within the liver, spleen, pancreas. Adrenal
glands are diffusely mildly prominent. No focal lesions are
identified however. No focal abnormality identified within the
kidneys. No hydronephrosis. Gallbladder is present.

Gastrointestinal tract: Stomach is mildly distended and contains
contrast. The ligament of Treitz is inferiorly positioned,
consistent partial malrotation. The proximal small bowel loops are
dilated with mild tapering in the left mid abdomen to normal
caliber. The distal small bowel loops are normal in caliber. The
appendix has a normal appearance. Small amounts of ascites.

There is abnormal appearance of the cecum. There is a small amount
of contrast in the cecal tip. However, the wall of the ascending
colon appears very thickened and there is associated stranding
within the mesentery. There is thickening of the right peritoneal
reflection adjacent to the colon. The findings are suspicious for
neoplasm. Other considerations include typhlitis or colitis.
Contrast is seen beyond this level to normal appearing hepatic
flexure and distal colon.

Pelvis: Urinary bladder contains Foley catheter. Prostatic seeds are
noted. Seminal vesicles have a normal appearance. There is moderate
stool within the sigmoid and rectum. No free pelvic fluid.

Retroperitoneum: There is moderate atherosclerosis of the abdominal
aorta. No retroperitoneal or mesenteric adenopathy.

Abdominal wall: Small left inguinal hernia contains ascites.

Osseous structures: Mild degenerative changes are seen in the spine.
IMPRESSION: 1. Partial small bowel obstruction, best localized to the left mid
abdomen.
2. Abnormal appearance of the cecum and ascending colon. Findings
are suspicious for malignancy. Other considerations include
typhlitis or colitis.
3. Small bilateral pleural effusions.  Hiatal hernia.
4. Cirrhotic contour of the liver.
5. Partial malrotation of the small bowel.
6. Prostatic seeds.
7. Abdominal aortic atherosclerosis.

## 2016-01-31 IMAGING — CR DG CHEST 1V PORT
1 series · 1 of 1 positions shown · non-contrast
Comparison: Portable exam 1001 hours compared to 06/17/2015

CLINICAL DATA: Nasogastric tube and hemodialysis catheter placement

EXAM:
PORTABLE CHEST 1 VIEW

[AP]
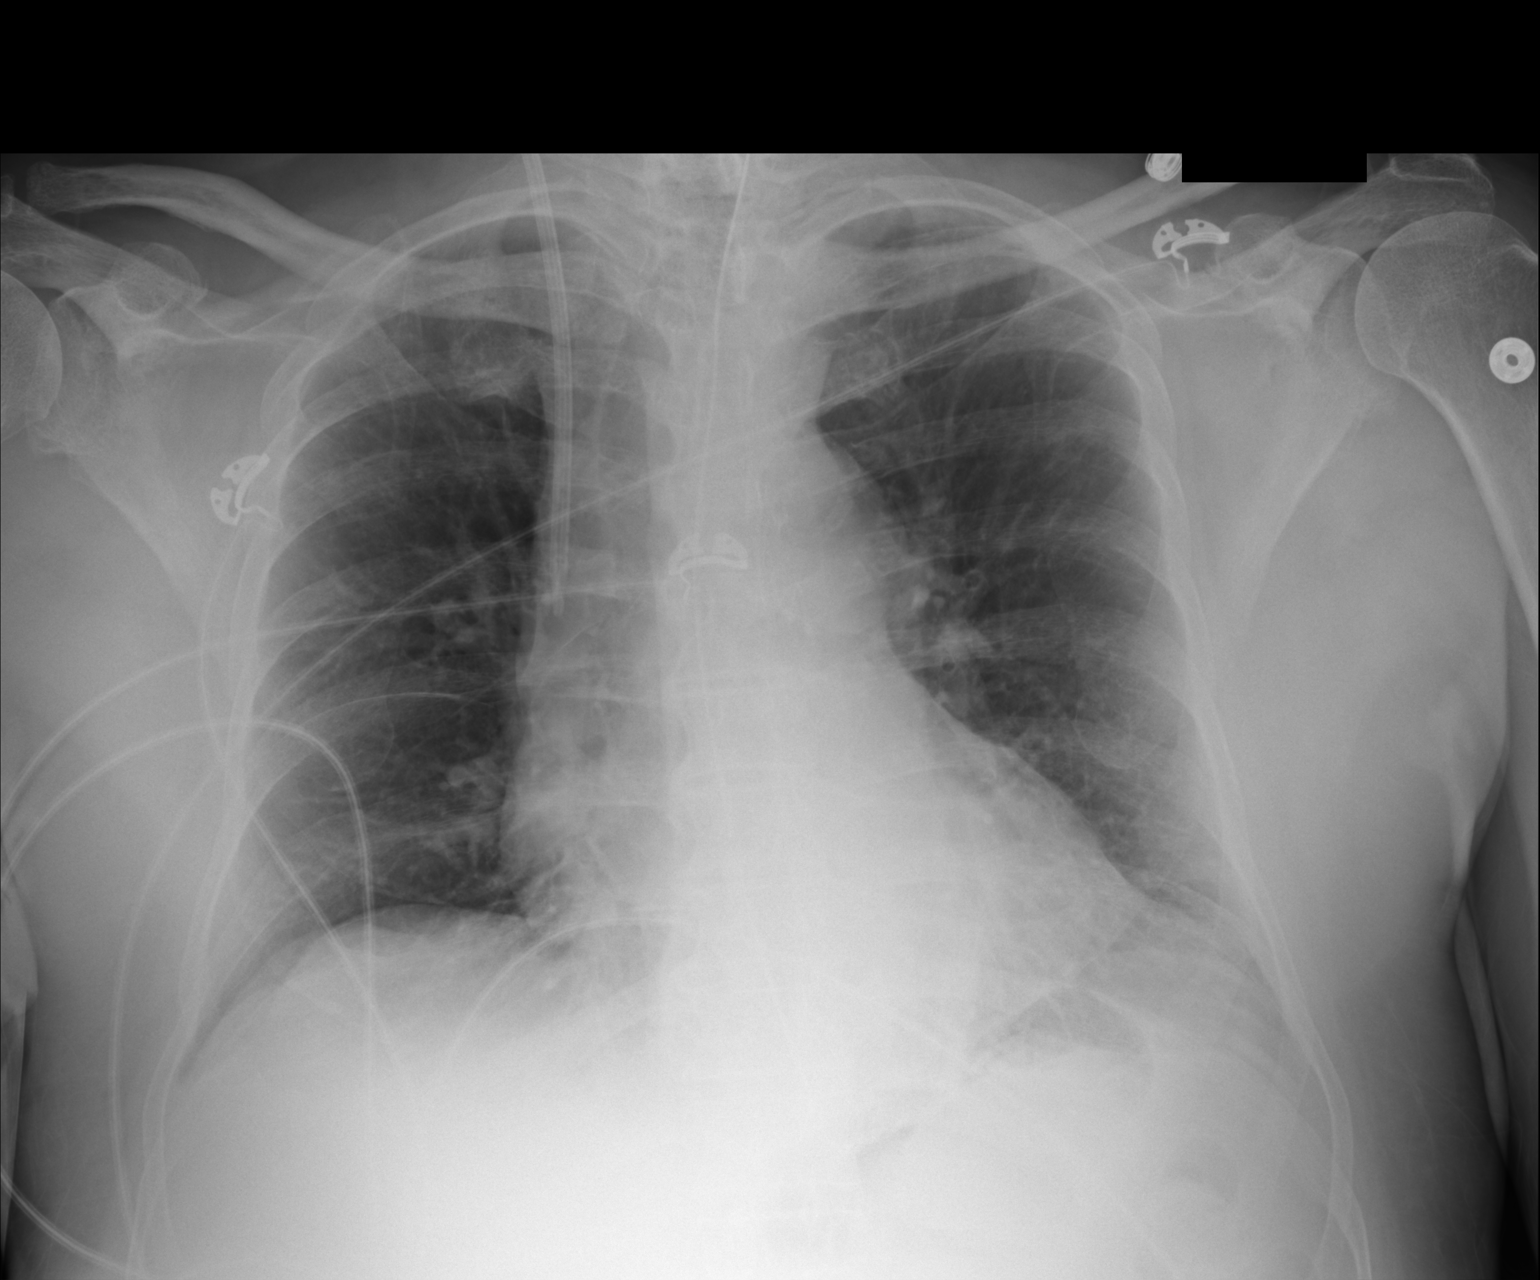

[1 of 1 positions shown; findings below may reference images not displayed]

FINDINGS: Nasogastric tube present extending into stomach.

RIGHT jugular central venous catheter with tip projecting over SVC.

Borderline enlargement of cardiac silhouette with slight vascular
congestion.

Atherosclerotic calcification at aortic arch.

Lungs clear.

No pleural effusion or pneumothorax.
IMPRESSION: No acute abnormalities.

## 2016-01-31 IMAGING — CR DG ABD PORTABLE 1V
1 series · 1 of 1 positions shown · non-contrast
Comparison: Study obtained earlier in the day

CLINICAL DATA: Nasogastric tube placement

EXAM:
PORTABLE ABDOMEN - 1 VIEW

[AP]
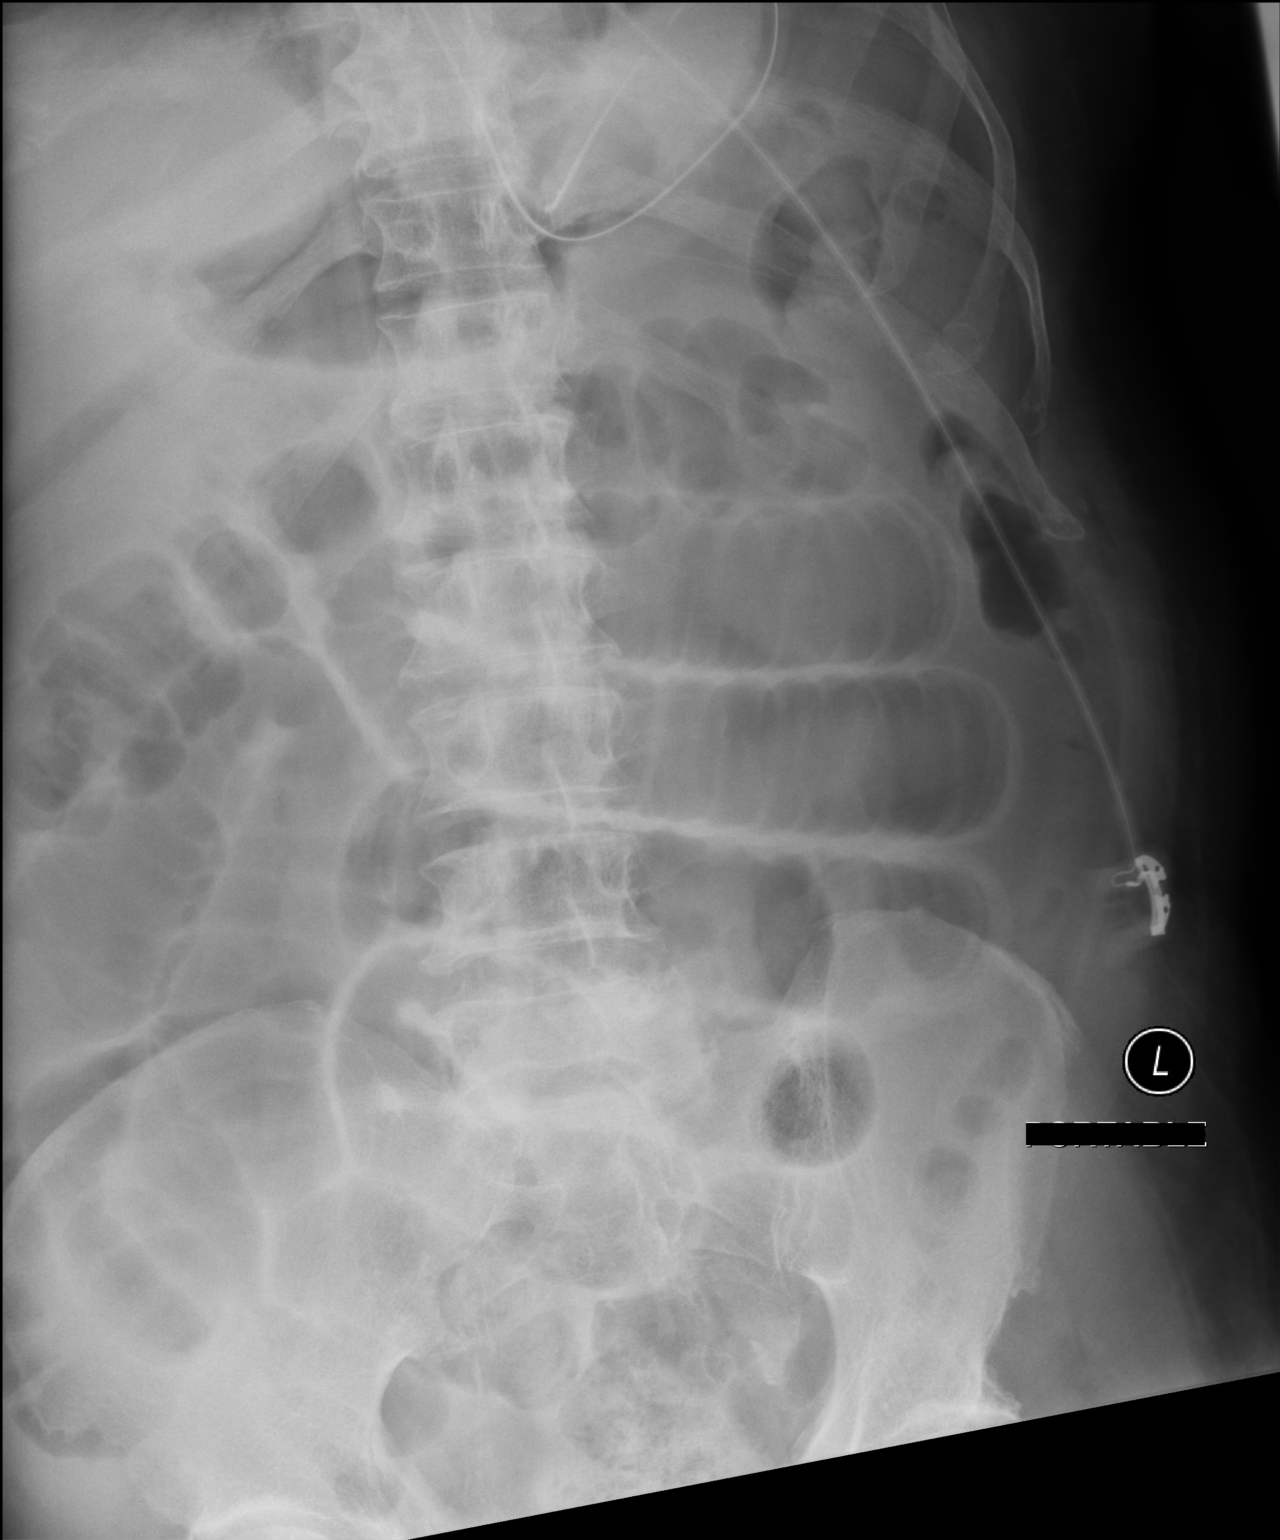

[1 of 1 positions shown; findings below may reference images not displayed]

FINDINGS: Nasogastric tube tip and side port are in the stomach. Stomach is no
longer distended with air. There are multiple loops of dilated small
bowel, concerning for a degree of obstruction. No free air is seen
on this supine examination.
IMPRESSION: Nasogastric tube tip and side port in stomach. Stomach no longer
distended with air. There remain loops of dilated small bowel,
concerning for a degree of obstruction.

## 2016-02-03 IMAGING — CR DG ABD PORTABLE 1V
1 series · 1 of 1 positions shown · non-contrast
Comparison: Radiographs earlier this day at 5335 hour

CLINICAL DATA: NG tube placement.  Hemoptysis.

EXAM:
PORTABLE ABDOMEN - 1 VIEW

[AP]
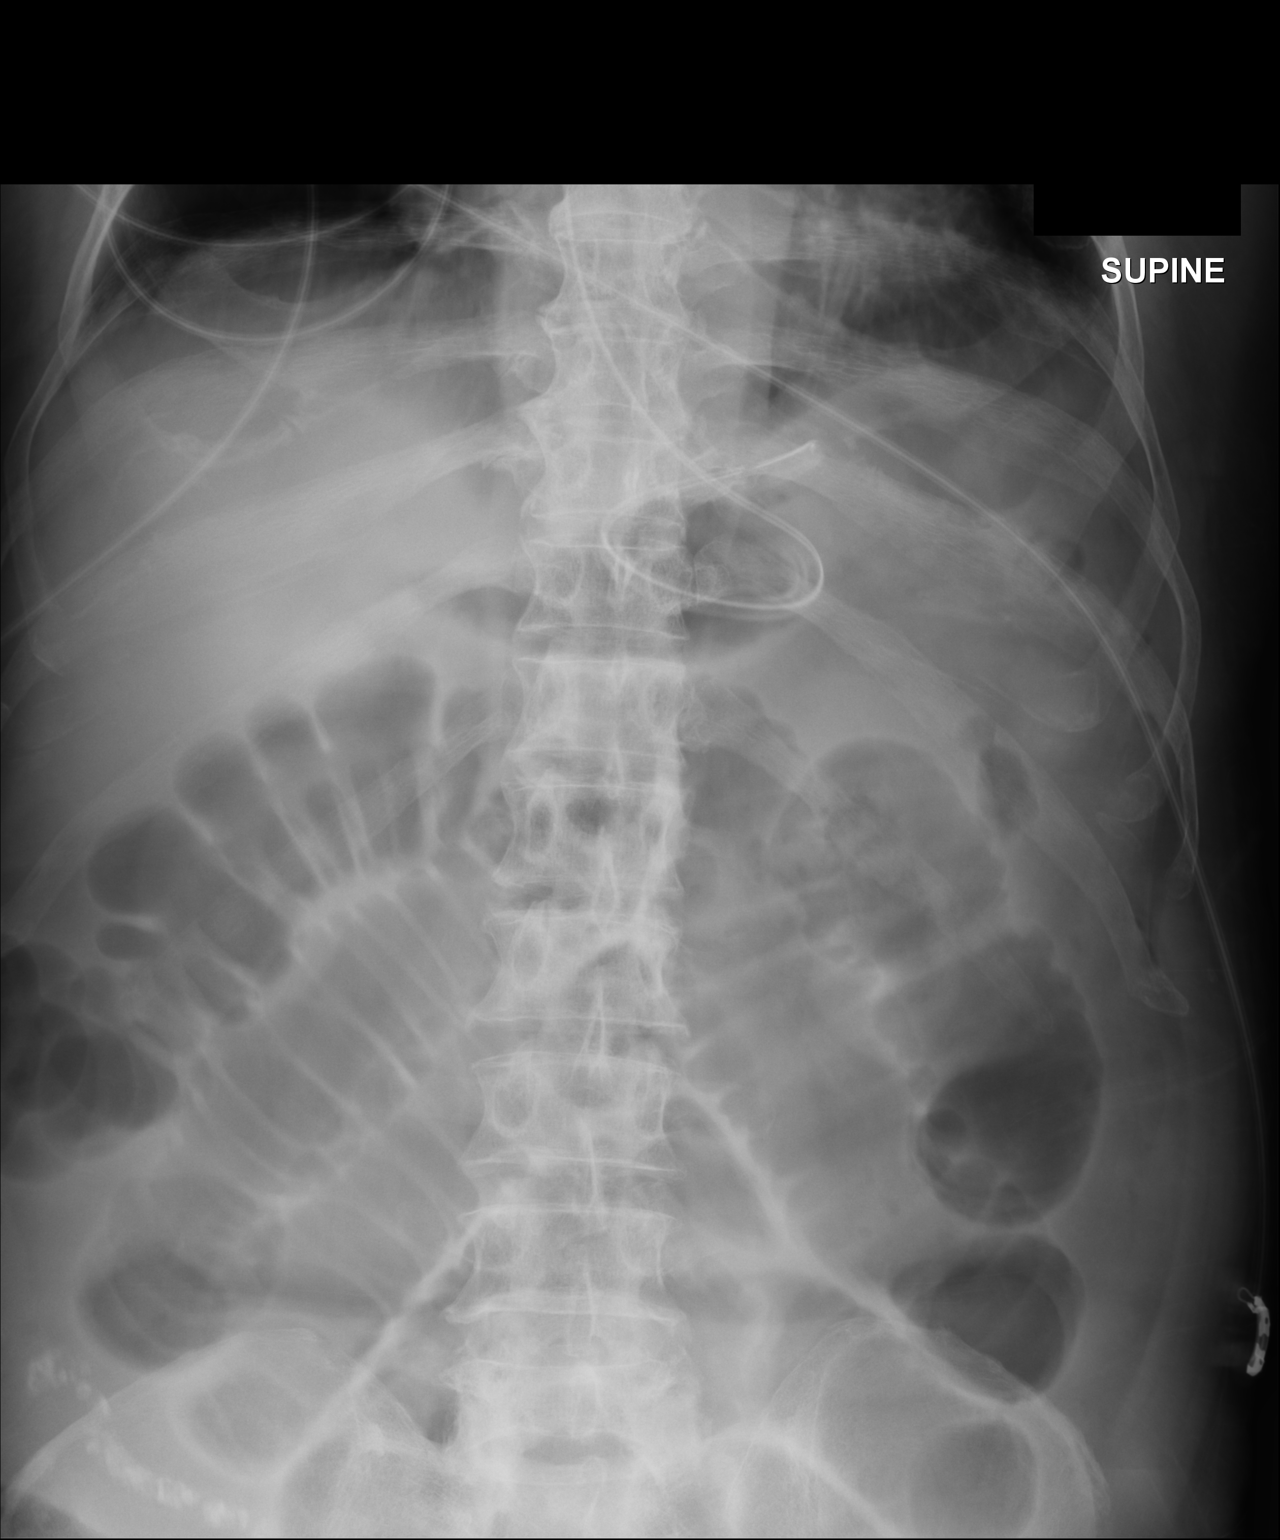

[1 of 1 positions shown; findings below may reference images not displayed]

FINDINGS: Tip and side-port of the enteric tube below the diaphragm, tip
directed towards the fundus, unchanged from prior exam. Diffusely
dilated small bowel loops in the central abdomen, with equivocal
improvement, greatest dimension 5.3 cm, previously 5.9 cm. No
evidence of free air.
IMPRESSION: Persistent small-bowel obstruction, with equivocal improvement and
decrease in degree of bowel distention. Enteric tube remains in
place.

## 2016-03-07 IMAGING — CR DG CHEST 1V
1 series · 1 of 1 positions shown · non-contrast
Comparison: 07/10/2015

CLINICAL DATA: PermCath placement

EXAM:
CHEST 1 VIEW

[AP]
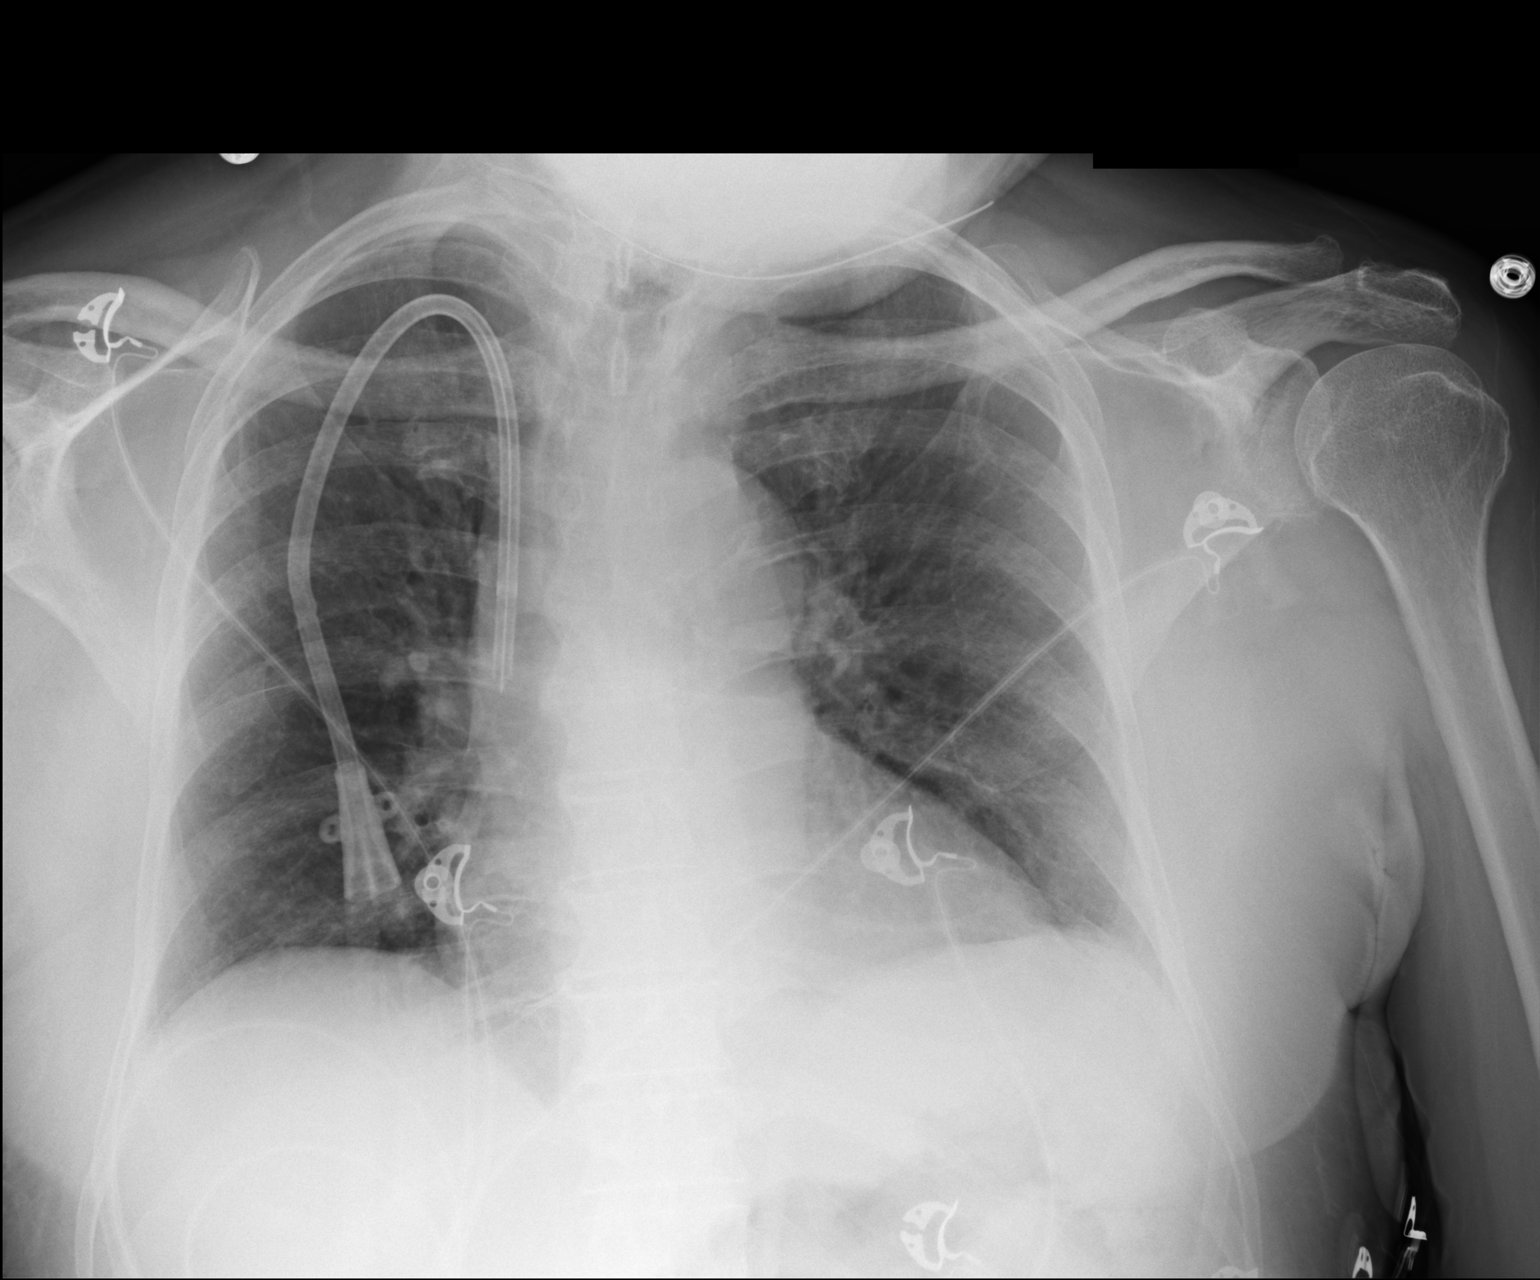

[1 of 1 positions shown; findings below may reference images not displayed]

FINDINGS: Right IJ catheter tip at the cavoatrial junction unchanged from
prior study.

Lungs remain clear without infiltrate or effusion.  No edema.
IMPRESSION: PermCath in the SVC unchanged from the prior study. No superimposed
acute cardiopulmonary abnormality.

## 2016-04-04 LAB — HEMOGLOBIN A1C: Hemoglobin A1C: 7.7

## 2016-09-14 LAB — LIPID PANEL
Cholesterol: 199 (ref 0–200)
HDL: 67 (ref 35–70)
LDL CALC: 115
Triglycerides: 86 (ref 40–160)

## 2016-09-14 LAB — CBC AND DIFFERENTIAL
HCT: 37 — AB (ref 41–53)
HEMOGLOBIN: 11.9 — AB (ref 13.5–17.5)
Neutrophils Absolute: 3
PLATELETS: 272 (ref 150–399)
WBC: 6.3

## 2016-09-14 LAB — VITAMIN D 25 HYDROXY (VIT D DEFICIENCY, FRACTURES): Vit D, 25-Hydroxy: 35.4

## 2016-09-14 LAB — HEMOGLOBIN A1C: Hemoglobin A1C: 7.3

## 2016-10-25 LAB — BASIC METABOLIC PANEL
BUN: 39 — AB (ref 4–21)
Creatinine: 1.8 — AB (ref 0.6–1.3)
Glucose: 141
Potassium: 5.2 (ref 3.4–5.3)
Potassium: 5.2 (ref 3.4–5.3)
SODIUM: 139 (ref 137–147)
Sodium: 139 (ref 137–147)

## 2017-01-23 LAB — HEMOGLOBIN A1C: HEMOGLOBIN A1C: 8.6

## 2017-01-23 LAB — HEPATIC FUNCTION PANEL
ALK PHOS: 94 (ref 25–125)
ALT: 14 (ref 10–40)
AST: 11 — AB (ref 14–40)
BILIRUBIN, TOTAL: 0.2

## 2017-01-23 LAB — LIPID PANEL
Cholesterol: 205 — AB (ref 0–200)
HDL: 52 (ref 35–70)
LDL CALC: 134
Triglycerides: 96 (ref 40–160)

## 2017-01-23 LAB — BASIC METABOLIC PANEL
BUN: 42 — AB (ref 4–21)
Creatinine: 2.1 — AB (ref 0.6–1.3)
Glucose: 186
Potassium: 5.3 (ref 3.4–5.3)
SODIUM: 141 (ref 137–147)

## 2017-06-02 ENCOUNTER — Ambulatory Visit: Payer: Medicare HMO | Admitting: Family Medicine

## 2017-06-02 ENCOUNTER — Encounter: Payer: Self-pay | Admitting: Family Medicine

## 2017-06-02 VITALS — BP 134/78 | HR 70 | Temp 97.8°F | Ht 73.0 in | Wt 211.0 lb

## 2017-06-02 DIAGNOSIS — E1169 Type 2 diabetes mellitus with other specified complication: Secondary | ICD-10-CM | POA: Diagnosis not present

## 2017-06-02 DIAGNOSIS — N401 Enlarged prostate with lower urinary tract symptoms: Secondary | ICD-10-CM

## 2017-06-02 DIAGNOSIS — I152 Hypertension secondary to endocrine disorders: Secondary | ICD-10-CM

## 2017-06-02 DIAGNOSIS — E1121 Type 2 diabetes mellitus with diabetic nephropathy: Secondary | ICD-10-CM | POA: Diagnosis not present

## 2017-06-02 DIAGNOSIS — Z8546 Personal history of malignant neoplasm of prostate: Secondary | ICD-10-CM

## 2017-06-02 DIAGNOSIS — E785 Hyperlipidemia, unspecified: Secondary | ICD-10-CM | POA: Diagnosis not present

## 2017-06-02 DIAGNOSIS — I10 Essential (primary) hypertension: Secondary | ICD-10-CM | POA: Diagnosis not present

## 2017-06-02 DIAGNOSIS — E1159 Type 2 diabetes mellitus with other circulatory complications: Secondary | ICD-10-CM | POA: Diagnosis not present

## 2017-06-02 DIAGNOSIS — N400 Enlarged prostate without lower urinary tract symptoms: Secondary | ICD-10-CM | POA: Insufficient documentation

## 2017-06-02 DIAGNOSIS — G4700 Insomnia, unspecified: Secondary | ICD-10-CM | POA: Diagnosis not present

## 2017-06-02 LAB — POCT GLYCOSYLATED HEMOGLOBIN (HGB A1C): Hemoglobin A1C: 9.3

## 2017-06-02 MED ORDER — GLIMEPIRIDE 4 MG PO TABS: 4.0000 mg | ORAL_TABLET | Freq: Two times a day (BID) | ORAL | 3 refills | Status: DC

## 2017-06-02 MED ORDER — CARVEDILOL 3.125 MG PO TABS: 3.1250 mg | ORAL_TABLET | Freq: Two times a day (BID) | ORAL | 3 refills | Status: DC

## 2017-06-02 MED ORDER — TAMSULOSIN HCL 0.4 MG PO CAPS: 0.4000 mg | ORAL_CAPSULE | Freq: Every day | ORAL | 3 refills | Status: DC

## 2017-06-02 NOTE — Assessment & Plan Note (Signed)
Stable.  Continue trazodone 300 mg nightly as needed.

## 2017-06-02 NOTE — Progress Notes (Signed)
Subjective:  David Ford is a 76 y.o. male who presents today with a chief complaint of type 2 diabetes and to establish care at this office.Marland Kitchen   HPI:  Patient recently moved back to [place] from [place].  He was in [place] for about 3 years.  He is a retired Civil engineer, contracting.  His son still lives in [place] and he has a daughter in [place].  Type 2 diabetes, chronic problem, new issue Several year history.  Currently on Amaryl 4 mg twice daily.  Has done well with this without any hypoglycemic symptoms.  No polyuria or polydipsia.  Unsure of his last A1c.  He has been on metformin in the past, however his previous PCP stopped this for unclear reasons.  Hyperlipidemia, chronic problem, new issue to this provider On Lipitor 10 mg daily.  Has been on this dose for several years.  Tolerating well without side effects.  No myalgias.  Hypertension, chronic problem, new to this provider Current regimen includes Coreg 3.125 mg twice daily and lisinopril 10 mg daily.  Several year history.  Stable on this dose.  No chest pain or shortness of breath.  No lower extremity swelling.  BPH, chronic problem, new to this provider On Flomax 0.4 mg daily.  Tolerates this well without side effects.  He does have a history of prostate cancer and has a PSA checked yearly.  This was last checked about 3 months ago and was 0.01.  Insomnia, chronic problem, new to this provider Takes trazodone 300 mg nightly.  Does well with this without side effects.  No obvious alleviating or aggravating factors.  ROS: Per HPI, otherwise a 14 point review of systems was performed and was negative  PMH:  The following were reviewed and entered/updated in epic: Past Medical History:  Diagnosis Date  . AKI (acute kidney injury) ([place]) 06/18/2015  . Cancer (HCC)    hx of prostate cancer  . Diabetes mellitus without complication ([place])   . DKA (diabetic ketoacidoses) ([place]) 06/18/2015  . Hypertension    Patient Active  Problem List   Diagnosis Date Noted  . Hyperlipidemia associated with type 2 diabetes mellitus ([place]) 06/02/2017  . BPH (benign prostatic hyperplasia) 06/02/2017  . History of prostate cancer 06/02/2017  . Insomnia 06/02/2017  . Atrial fibrillation ([place]) 06/21/2015  . Hypertension associated with diabetes ([place]) 06/17/2015  . Type 2 diabetes mellitus ([place]) 06/17/2015   Past Surgical History:  Procedure Laterality Date  . AMPUTATION Left 07/03/2015   Procedure: Left Foot 1st and 2nd Ray Amputation;  Surgeon: Newt Minion, MD;  Location: [facility];  Service: Orthopedics;  Laterality: Left;  . BASCILIC VEIN TRANSPOSITION Left 07/14/2015   Procedure: BRACHIOBASILIC VEIN TRANSPOSITION  ;  Surgeon: Elam Dutch, MD;  Location: [facility] OR;  Service: Vascular;  Laterality: Left;  . prostate seeds      Family History  Problem Relation Age of Onset  . Diabetes Mother   . Diabetes Sister   . Diabetes Brother     Medications- reviewed and updated Current Outpatient Medications  Medication Sig Dispense Refill  . amLODipine (NORVASC) 10 MG tablet Take 10 mg by mouth daily.    Marland Kitchen atorvastatin (LIPITOR) 10 MG tablet Take 10 mg by mouth daily.    . carvedilol (COREG) 3.125 MG tablet Take 1 tablet (3.125 mg total) by mouth 2 (two) times daily with a meal. 180 tablet 3  . glimepiride (AMARYL) 4 MG tablet Take 1 tablet (4 mg total) by mouth  2 (two) times daily. 180 tablet 3  . lisinopril (PRINIVIL,ZESTRIL) 10 MG tablet Take 10 mg by mouth daily.    . tamsulosin (FLOMAX) 0.4 MG CAPS capsule Take 1 capsule (0.4 mg total) by mouth daily. 90 capsule 3  . trazodone (DESYREL) 300 MG tablet Take 300 mg by mouth at bedtime.     No current facility-administered medications for this visit.     Allergies-reviewed and updated No Known Allergies  Social History   Socioeconomic History  . Marital status: Married    Spouse name: None  . Number of children: None  . Years of education: None  . Highest education  level: None  Social Needs  . Financial resource strain: None  . Food insecurity - worry: None  . Food insecurity - inability: None  . Transportation needs - medical: None  . Transportation needs - non-medical: None  Occupational History  . None  Tobacco Use  . Smoking status: Never Smoker  . Smokeless tobacco: Never Used  Substance and Sexual Activity  . Alcohol use: Yes    Comment: once a week  . Drug use: No  . Sexual activity: None  Other Topics Concern  . None  Social History Narrative  . None     Objective:  Physical Exam: BP 134/78 (BP Location: Left Arm, Patient Position: Sitting, Cuff Size: Normal)   Pulse 70   Temp 97.8 F (36.6 C) (Oral)   Ht 6\' 1"  (1.854 m)   Wt 211 lb (95.7 kg)   SpO2 97%   BMI 27.84 kg/m   Gen: NAD, resting comfortably CV: RRR with no murmurs appreciated Pulm: NWOB, CTAB with no crackles, wheezes, or rhonchi GI: Normal bowel sounds present. Soft, Nontender, Nondistended. MSK: No edema, cyanosis, or clubbing noted Skin: Warm, dry Neuro: Grossly normal, moves all extremities Psych: Normal affect and thought content  Results for orders placed or performed in visit on 06/02/17 (from the past 72 hour(s))  POCT glycosylated hemoglobin (Hb A1C)     Status: None   Collection Time: 06/02/17  9:38 AM  Result Value Ref Range   Hemoglobin A1C 9.3     Assessment/Plan:  Type 2 diabetes mellitus (HCC) A1c 9.3.  Deferred making medication change today.  We will await records from his prior PCP.  Depending on his renal function, would likely benefit from starting metformin.  Follow-up in 3 months.  Hypertension associated with diabetes ([place]) At goal.  Continue Coreg and lisinopril.  Hyperlipidemia associated with type 2 diabetes mellitus (HCC) Continue Lipitor 10 mg daily.  Will obtain most recent lipid panel from his previous PCP.  BPH (benign prostatic hyperplasia) Continue Flomax 0.4 mg daily.  History of prostate cancer Needs yearly  PSA.  Will obtain prior records.  Insomnia Stable.  Continue trazodone 300 mg nightly as needed.  Preventative healthcare We will obtain prior records and update as needed.  Patient will follow-up soon for his annual wellness visit.  [name]. Jerline Pain, MD 06/02/2017 12:51 PM

## 2017-06-02 NOTE — Assessment & Plan Note (Addendum)
A1c 9.3.  Deferred making medication change today.  We will await records from his prior PCP.  Depending on his renal function, would likely benefit from starting metformin.  Follow-up in 3 months.

## 2017-06-02 NOTE — Assessment & Plan Note (Signed)
At goal.  Continue Coreg and lisinopril.

## 2017-06-02 NOTE — Assessment & Plan Note (Signed)
Continue Lipitor 10 mg daily.  Will obtain most recent lipid panel from his previous PCP.

## 2017-06-02 NOTE — Patient Instructions (Signed)
No changes today.  I sent in refills.  Please schedule an annual wellness visit soon.  Come back to see me in 3 months.  Take care,  Dr Jerline Pain

## 2017-06-02 NOTE — Assessment & Plan Note (Signed)
Needs yearly PSA.  Will obtain prior records.

## 2017-06-02 NOTE — Assessment & Plan Note (Signed)
Continue Flomax 0.4 mg daily

## 2017-07-03 ENCOUNTER — Other Ambulatory Visit: Payer: Self-pay

## 2017-07-03 ENCOUNTER — Telehealth: Payer: Self-pay | Admitting: Family Medicine

## 2017-07-03 MED ORDER — TRAZODONE HCL 300 MG PO TABS: 300.0000 mg | ORAL_TABLET | Freq: Every day | ORAL | 0 refills | Status: DC

## 2017-07-03 NOTE — Telephone Encounter (Signed)
Please advise on refill.

## 2017-07-03 NOTE — Telephone Encounter (Signed)
Copied from [place] [phone]. Topic: Quick Communication - Rx Refill/Question >> Jul 03, 2017  8:41 AM Carolyn Stare wrote: Medication trazodone (DESYREL) 300 MG tablet    this med has never been refilled by Dr Jerline Pain    Preferred Pharmacy    [facility] at [facility] and [address]

## 2017-07-03 NOTE — Telephone Encounter (Signed)
Rx has been sent to patient's pharmacy. 

## 2017-07-03 NOTE — Telephone Encounter (Signed)
Ok with me. Please place any necessary orders. 

## 2017-07-05 ENCOUNTER — Encounter: Payer: Self-pay | Admitting: *Deleted

## 2017-07-30 ENCOUNTER — Encounter (HOSPITAL_COMMUNITY): Payer: Self-pay

## 2017-07-30 ENCOUNTER — Inpatient Hospital Stay (HOSPITAL_COMMUNITY)
Admission: EM | Admit: 2017-07-30 | Discharge: 2017-08-01 | DRG: 638 | Disposition: A | Discharge status: Home Health | Payer: Medicare PPO | Attending: Internal Medicine | Admitting: Internal Medicine

## 2017-07-30 ENCOUNTER — Other Ambulatory Visit: Payer: Self-pay

## 2017-07-30 ENCOUNTER — Inpatient Hospital Stay (HOSPITAL_COMMUNITY): Payer: Medicare PPO

## 2017-07-30 DIAGNOSIS — E1129 Type 2 diabetes mellitus with other diabetic kidney complication: Secondary | ICD-10-CM | POA: Insufficient documentation

## 2017-07-30 DIAGNOSIS — E875 Hyperkalemia: Secondary | ICD-10-CM

## 2017-07-30 DIAGNOSIS — I129 Hypertensive chronic kidney disease with stage 1 through stage 4 chronic kidney disease, or unspecified chronic kidney disease: Secondary | ICD-10-CM | POA: Diagnosis present

## 2017-07-30 DIAGNOSIS — Z8249 Family history of ischemic heart disease and other diseases of the circulatory system: Secondary | ICD-10-CM | POA: Diagnosis not present

## 2017-07-30 DIAGNOSIS — Z8546 Personal history of malignant neoplasm of prostate: Secondary | ICD-10-CM

## 2017-07-30 DIAGNOSIS — R739 Hyperglycemia, unspecified: Secondary | ICD-10-CM | POA: Diagnosis not present

## 2017-07-30 DIAGNOSIS — E1159 Type 2 diabetes mellitus with other circulatory complications: Secondary | ICD-10-CM | POA: Diagnosis present

## 2017-07-30 DIAGNOSIS — H409 Unspecified glaucoma: Secondary | ICD-10-CM | POA: Diagnosis present

## 2017-07-30 DIAGNOSIS — I10 Essential (primary) hypertension: Secondary | ICD-10-CM

## 2017-07-30 DIAGNOSIS — N183 Chronic kidney disease, stage 3 (moderate): Secondary | ICD-10-CM | POA: Diagnosis present

## 2017-07-30 DIAGNOSIS — R531 Weakness: Secondary | ICD-10-CM | POA: Diagnosis present

## 2017-07-30 DIAGNOSIS — I480 Paroxysmal atrial fibrillation: Secondary | ICD-10-CM | POA: Diagnosis not present

## 2017-07-30 DIAGNOSIS — R0989 Other specified symptoms and signs involving the circulatory and respiratory systems: Secondary | ICD-10-CM | POA: Diagnosis not present

## 2017-07-30 DIAGNOSIS — N179 Acute kidney failure, unspecified: Secondary | ICD-10-CM | POA: Diagnosis not present

## 2017-07-30 DIAGNOSIS — E860 Dehydration: Secondary | ICD-10-CM | POA: Diagnosis not present

## 2017-07-30 DIAGNOSIS — Z833 Family history of diabetes mellitus: Secondary | ICD-10-CM | POA: Diagnosis not present

## 2017-07-30 DIAGNOSIS — E0810 Diabetes mellitus due to underlying condition with ketoacidosis without coma: Secondary | ICD-10-CM

## 2017-07-30 DIAGNOSIS — N189 Chronic kidney disease, unspecified: Secondary | ICD-10-CM

## 2017-07-30 DIAGNOSIS — E1110 Type 2 diabetes mellitus with ketoacidosis without coma: Principal | ICD-10-CM | POA: Diagnosis present

## 2017-07-30 DIAGNOSIS — N400 Enlarged prostate without lower urinary tract symptoms: Secondary | ICD-10-CM | POA: Diagnosis not present

## 2017-07-30 DIAGNOSIS — E1165 Type 2 diabetes mellitus with hyperglycemia: Secondary | ICD-10-CM

## 2017-07-30 DIAGNOSIS — Z7984 Long term (current) use of oral hypoglycemic drugs: Secondary | ICD-10-CM | POA: Diagnosis not present

## 2017-07-30 DIAGNOSIS — R404 Transient alteration of awareness: Secondary | ICD-10-CM | POA: Diagnosis not present

## 2017-07-30 DIAGNOSIS — R42 Dizziness and giddiness: Secondary | ICD-10-CM | POA: Diagnosis not present

## 2017-07-30 DIAGNOSIS — Z79899 Other long term (current) drug therapy: Secondary | ICD-10-CM

## 2017-07-30 DIAGNOSIS — I447 Left bundle-branch block, unspecified: Secondary | ICD-10-CM | POA: Diagnosis present

## 2017-07-30 DIAGNOSIS — R55 Syncope and collapse: Secondary | ICD-10-CM | POA: Diagnosis not present

## 2017-07-30 DIAGNOSIS — E1122 Type 2 diabetes mellitus with diabetic chronic kidney disease: Secondary | ICD-10-CM | POA: Diagnosis not present

## 2017-07-30 DIAGNOSIS — E119 Type 2 diabetes mellitus without complications: Secondary | ICD-10-CM | POA: Insufficient documentation

## 2017-07-30 DIAGNOSIS — I4891 Unspecified atrial fibrillation: Secondary | ICD-10-CM | POA: Diagnosis not present

## 2017-07-30 DIAGNOSIS — E785 Hyperlipidemia, unspecified: Secondary | ICD-10-CM | POA: Diagnosis not present

## 2017-07-30 LAB — GLUCOSE, CAPILLARY
GLUCOSE-CAPILLARY: 308 mg/dL — AB (ref 65–99)
Glucose-Capillary: 353 mg/dL — ABNORMAL HIGH (ref 65–99)
Glucose-Capillary: 177 mg/dL — ABNORMAL HIGH (ref 65–99)
Glucose-Capillary: 100 mg/dL — ABNORMAL HIGH (ref 65–99)
Glucose-Capillary: 181 mg/dL — ABNORMAL HIGH (ref 65–99)

## 2017-07-30 LAB — CBC
HEMATOCRIT: 37.5 % — AB (ref 39.0–52.0)
Hemoglobin: 12.5 g/dL — ABNORMAL LOW (ref 13.0–17.0)
MCH: 28 pg (ref 26.0–34.0)
MCHC: 33.3 g/dL (ref 30.0–36.0)
MCV: 83.9 fL (ref 78.0–100.0)
Platelets: 250 10*3/uL (ref 150–400)
RBC: 4.47 MIL/uL (ref 4.22–5.81)
RDW: 12.4 % (ref 11.5–15.5)
WBC: 12.7 10*3/uL — ABNORMAL HIGH (ref 4.0–10.5)

## 2017-07-30 LAB — BASIC METABOLIC PANEL
Anion gap: 21 — ABNORMAL HIGH (ref 5–15)
BUN: 63 mg/dL — AB (ref 6–20)
CALCIUM: 9 mg/dL (ref 8.9–10.3)
CHLORIDE: 90 mmol/L — AB (ref 101–111)
CO2: 14 mmol/L — ABNORMAL LOW (ref 22–32)
CREATININE: 3.63 mg/dL — AB (ref 0.61–1.24)
GFR, EST AFRICAN AMERICAN: 17 mL/min — AB (ref >60)
GFR, EST NON AFRICAN AMERICAN: 15 mL/min — AB (ref >60)
Glucose, Bld: 870 mg/dL (ref 65–99)
Potassium: 6 mmol/L — ABNORMAL HIGH (ref 3.5–5.1)
SODIUM: 125 mmol/L — AB (ref 135–145)

## 2017-07-30 LAB — I-STAT TROPONIN, ED: TROPONIN I, POC: 0.04 ng/mL (ref 0.00–0.08)

## 2017-07-30 LAB — CBG MONITORING, ED: Glucose-Capillary: 415 mg/dL — ABNORMAL HIGH (ref 65–99)

## 2017-07-30 LAB — CBC WITH DIFFERENTIAL/PLATELET
BASOS PCT: 0 %
Basophils Absolute: 0 10*3/uL (ref 0.0–0.1)
EOS PCT: 0 %
Eosinophils Absolute: 0 10*3/uL (ref 0.0–0.7)
HCT: 41.3 % (ref 39.0–52.0)
Hemoglobin: 13.3 g/dL (ref 13.0–17.0)
LYMPHS ABS: 1.3 10*3/uL (ref 0.7–4.0)
Lymphocytes Relative: 12 %
MCH: 28.5 pg (ref 26.0–34.0)
MCHC: 32.2 g/dL (ref 30.0–36.0)
MCV: 88.6 fL (ref 78.0–100.0)
MONO ABS: 0.5 10*3/uL (ref 0.1–1.0)
MONOS PCT: 4 %
NEUTROS PCT: 84 %
Neutro Abs: 8.6 10*3/uL — ABNORMAL HIGH (ref 1.7–7.7)
PLATELETS: 265 10*3/uL (ref 150–400)
RBC: 4.66 MIL/uL (ref 4.22–5.81)
RDW: 12.9 % (ref 11.5–15.5)
WBC: 10.4 10*3/uL (ref 4.0–10.5)

## 2017-07-30 LAB — URINALYSIS, ROUTINE W REFLEX MICROSCOPIC
BACTERIA UA: NONE SEEN
BILIRUBIN URINE: NEGATIVE
Glucose, UA: >=500 mg/dL — AB
KETONES UR: NEGATIVE mg/dL
Leukocytes, UA: NEGATIVE
NITRITE: NEGATIVE
PROTEIN: NEGATIVE mg/dL
Specific Gravity, Urine: 1.023 (ref 1.005–1.030)
pH: 5 (ref 5.0–8.0)

## 2017-07-30 LAB — CREATININE, SERUM
CREATININE: 2.97 mg/dL — AB (ref 0.61–1.24)
GFR, EST AFRICAN AMERICAN: 22 mL/min — AB (ref >60)
GFR, EST NON AFRICAN AMERICAN: 19 mL/min — AB (ref >60)

## 2017-07-30 MED ORDER — SODIUM CHLORIDE 0.9 % IV BOLUS (SEPSIS)
1000.0000 mL | Freq: Once | INTRAVENOUS | Status: AC
  Administered 2017-07-30: 1000 mL via INTRAVENOUS

## 2017-07-30 MED ORDER — ACETAMINOPHEN 325 MG PO TABS: 650.0000 mg | ORAL_TABLET | Freq: Four times a day (QID) | ORAL | Status: DC | PRN

## 2017-07-30 MED ORDER — DEXTROSE 5 % IV SOLN
500.0000 mg | INTRAVENOUS | Status: DC
  Administered 2017-07-30: 500 mg via INTRAVENOUS
  Filled 2017-07-30: qty 500

## 2017-07-30 MED ORDER — SODIUM CHLORIDE 0.9 % IV BOLUS (SEPSIS): 1000.0000 mL | Freq: Once | INTRAVENOUS | Status: DC

## 2017-07-30 MED ORDER — ATORVASTATIN CALCIUM 10 MG PO TABS
10.0000 mg | ORAL_TABLET | Freq: Every day | ORAL | Status: DC
  Administered 2017-07-31 – 2017-08-01 (×2): 10 mg via ORAL
  Filled 2017-07-30 (×2): qty 1

## 2017-07-30 MED ORDER — BISACODYL 5 MG PO TBEC: 5.0000 mg | DELAYED_RELEASE_TABLET | Freq: Every day | ORAL | Status: DC | PRN

## 2017-07-30 MED ORDER — DEXTROSE 5 % IV SOLN
1.0000 g | INTRAVENOUS | Status: DC
  Administered 2017-07-30: 1 g via INTRAVENOUS
  Filled 2017-07-30: qty 10

## 2017-07-30 MED ORDER — DEXTROSE-NACL 5-0.45 % IV SOLN
INTRAVENOUS | Status: DC
  Administered 2017-07-30 – 2017-07-31 (×2): via INTRAVENOUS

## 2017-07-30 MED ORDER — HYDROCODONE-ACETAMINOPHEN 5-325 MG PO TABS: 1.0000 | ORAL_TABLET | ORAL | Status: DC | PRN

## 2017-07-30 MED ORDER — LISINOPRIL 10 MG PO TABS
10.0000 mg | ORAL_TABLET | Freq: Every day | ORAL | Status: DC
  Administered 2017-07-31: 10 mg via ORAL
  Filled 2017-07-30: qty 1

## 2017-07-30 MED ORDER — ACETAMINOPHEN 650 MG RE SUPP: 650.0000 mg | Freq: Four times a day (QID) | RECTAL | Status: DC | PRN

## 2017-07-30 MED ORDER — CARVEDILOL 3.125 MG PO TABS
3.1250 mg | ORAL_TABLET | Freq: Two times a day (BID) | ORAL | Status: DC
  Administered 2017-07-31 – 2017-08-01 (×3): 3.125 mg via ORAL
  Filled 2017-07-30 (×4): qty 1

## 2017-07-30 MED ORDER — INSULIN ASPART 100 UNIT/ML ~~LOC~~ SOLN
10.0000 [IU] | Freq: Once | SUBCUTANEOUS | Status: AC
  Administered 2017-07-30: 10 [IU] via INTRAVENOUS
  Filled 2017-07-30: qty 1

## 2017-07-30 MED ORDER — SODIUM CHLORIDE 0.9 % IV SOLN
INTRAVENOUS | Status: DC
  Filled 2017-07-30: qty 1

## 2017-07-30 MED ORDER — ONDANSETRON HCL 4 MG/2ML IJ SOLN: 4.0000 mg | Freq: Four times a day (QID) | INTRAMUSCULAR | Status: DC | PRN

## 2017-07-30 MED ORDER — HEPARIN SODIUM (PORCINE) 5000 UNIT/ML IJ SOLN
5000.0000 [IU] | Freq: Three times a day (TID) | INTRAMUSCULAR | Status: DC
  Administered 2017-07-30 – 2017-08-01 (×5): 5000 [IU] via SUBCUTANEOUS
  Filled 2017-07-30 (×5): qty 1

## 2017-07-30 MED ORDER — CARVEDILOL 3.125 MG PO TABS: 3.1250 mg | ORAL_TABLET | Freq: Two times a day (BID) | ORAL | Status: DC

## 2017-07-30 MED ORDER — TAMSULOSIN HCL 0.4 MG PO CAPS
0.4000 mg | ORAL_CAPSULE | Freq: Every day | ORAL | Status: DC
  Administered 2017-07-31 – 2017-08-01 (×2): 0.4 mg via ORAL
  Filled 2017-07-30 (×2): qty 1

## 2017-07-30 MED ORDER — SODIUM CHLORIDE 0.9 % IV SOLN
INTRAVENOUS | Status: DC
  Administered 2017-07-30: 5.5 [IU]/h via INTRAVENOUS
  Filled 2017-07-30: qty 1

## 2017-07-30 MED ORDER — AMLODIPINE BESYLATE 10 MG PO TABS
10.0000 mg | ORAL_TABLET | Freq: Every day | ORAL | Status: DC
  Administered 2017-07-31: 10 mg via ORAL
  Filled 2017-07-30: qty 2
  Filled 2017-07-30: qty 1

## 2017-07-30 MED ORDER — INSULIN REGULAR BOLUS VIA INFUSION
0.0000 [IU] | Freq: Three times a day (TID) | INTRAVENOUS | Status: DC
  Filled 2017-07-30: qty 10

## 2017-07-30 MED ORDER — DEXTROSE 50 % IV SOLN: 25.0000 mL | INTRAVENOUS | Status: DC | PRN

## 2017-07-30 MED ORDER — ONDANSETRON HCL 4 MG PO TABS: 4.0000 mg | ORAL_TABLET | Freq: Four times a day (QID) | ORAL | Status: DC | PRN

## 2017-07-30 MED ORDER — SODIUM CHLORIDE 0.9 % IV SOLN
INTRAVENOUS | Status: DC
  Administered 2017-07-30: 20:00:00 via INTRAVENOUS

## 2017-07-30 MED ORDER — TRAZODONE HCL 50 MG PO TABS
25.0000 mg | ORAL_TABLET | Freq: Every evening | ORAL | Status: DC | PRN
  Administered 2017-07-30 – 2017-07-31 (×2): 25 mg via ORAL
  Filled 2017-07-30 (×2): qty 1

## 2017-07-30 MED ORDER — IPRATROPIUM-ALBUTEROL 0.5-2.5 (3) MG/3ML IN SOLN
3.0000 mL | Freq: Four times a day (QID) | RESPIRATORY_TRACT | Status: DC
  Administered 2017-07-30 – 2017-07-31 (×3): 3 mL via RESPIRATORY_TRACT
  Filled 2017-07-30 (×3): qty 3

## 2017-07-30 NOTE — ED Triage Notes (Signed)
Pt arrived via [facility] from [facility] c/o dizziness.  Was seen for same on the 8th.  Denies any injuries.  BP 84/50 with EMS given 132mL NS.

## 2017-07-30 NOTE — ED Provider Notes (Signed)
[facility] EMERGENCY DEPARTMENT Provider Note   CSN: [id number] Arrival date & time: 07/30/17  1328     History   Chief Complaint Chief Complaint  Patient presents with  . Hypotension  . Dizziness    HPI David Ford is a 77 y.o. male.  HPI  Patient with a history of hypertension, diabetes, kidney injury now off dialysis presenting with complaint of feeling lightheaded and nearly fainting while shopping at [place].  He did not faint but EMS was called and blood pressure was systolic in the 30Q.  He had no chest pain no severe headache no shortness of breath.  His blood pressure responded well to fluids.  He has no current complaints.  He has been eating and drinking normally, no fevers, no vomiting or diarrhea.  He has not had any change in his blood pressure medications and took his medications this morning as per his usual routine.  There are no other associated systemic symptoms, there are no other alleviating or modifying factors.  Pt states he drinks 7-8 glasses of watery daily.     Past Medical History:  Diagnosis Date  . AKI (acute kidney injury) ([place]) 06/18/2015  . Cancer (HCC)    hx of prostate cancer  . Chronic kidney disease   . Diabetes mellitus without complication ([place])   . DKA (diabetic ketoacidoses) ([place]) 06/18/2015  . Glaucoma   . Hyperlipidemia   . Hypertension     Patient Active Problem List   Diagnosis Date Noted  . Acute kidney injury superimposed on CKD ([place]) 07/30/2017  . Hyperglycemia 07/30/2017  . Hyperlipidemia associated with type 2 diabetes mellitus ([place]) 06/02/2017  . BPH (benign prostatic hyperplasia) 06/02/2017  . History of prostate cancer 06/02/2017  . Insomnia 06/02/2017  . Atrial fibrillation ([place]) 06/21/2015  . Hypertension associated with diabetes ([place]) 06/17/2015  . Hyperkalemia 06/17/2015  . DM type 2, uncontrolled, with renal complications ([place]) 65/78/4696    Past Surgical History:  Procedure Laterality  Date  . AMPUTATION Left 07/03/2015   Procedure: Left Foot 1st and 2nd Ray Amputation;  Surgeon: Newt Minion, MD;  Location: [facility];  Service: Orthopedics;  Laterality: Left;  . BASCILIC VEIN TRANSPOSITION Left 07/14/2015   Procedure: BRACHIOBASILIC VEIN TRANSPOSITION  ;  Surgeon: Elam Dutch, MD;  Location: [facility] OR;  Service: Vascular;  Laterality: Left;  . prostate seeds         Home Medications    Prior to Admission medications   Medication Sig Start Date End Date Taking? Authorizing Provider  amLODipine (NORVASC) 10 MG tablet Take 10 mg by mouth 2 (two) times daily.    Yes [provider]  atorvastatin (LIPITOR) 10 MG tablet Take 10 mg by mouth daily.   Yes [provider]  carvedilol (COREG) 3.125 MG tablet Take 1 tablet (3.125 mg total) by mouth 2 (two) times daily with a meal. 06/02/17  Yes [name], MD  glimepiride (AMARYL) 4 MG tablet Take 1 tablet (4 mg total) by mouth 2 (two) times daily. 06/02/17  Yes [name], MD  lisinopril (PRINIVIL,ZESTRIL) 10 MG tablet Take 10 mg by mouth daily.   Yes [provider]  tamsulosin (FLOMAX) 0.4 MG CAPS capsule Take 1 capsule (0.4 mg total) by mouth daily. 06/02/17  Yes [name], MD  trazodone (DESYREL) 300 MG tablet Take 1 tablet (300 mg total) by mouth at bedtime. 07/03/17  Yes [name], MD  Vitamin D, Cholecalciferol, 1000 units  CAPS Take 1,000 Units by mouth daily.   Yes [provider]    Family History Family History  Problem Relation Age of Onset  . Diabetes Mother   . Hypertension Mother   . Stroke Mother   . Diabetes Sister   . Diabetes Brother     Social History Social History   Tobacco Use  . Smoking status: Never Smoker  . Smokeless tobacco: Never Used  Substance Use Topics  . Alcohol use: Yes    Comment: once a week  . Drug use: No     Allergies   Patient has no known allergies.   Review of Systems Review of Systems  ROS reviewed and all  otherwise negative except for mentioned in HPI   Physical Exam Updated Vital Signs BP (!) 101/56   Pulse 76   Temp 97.8 F (36.6 C) (Oral)   Resp 14   Ht 6\' 1"  (1.854 m)   Wt 95.7 kg (211 lb)   SpO2 95%   BMI 27.84 kg/m  Vitals reivewed Physical Exam  Physical Examination: General appearance - alert, well appearing, and in no distress Mental status - alert, oriented to person, place, and time Eyes - no conjunctival injection, no scleral icterus Mouth - mucous membranes moist, pharynx normal without lesions Chest - clear to auscultation, no wheezes, rales or rhonchi, symmetric air entry Heart - normal rate, regular rhythm, normal S1, S2, no murmurs, rubs, clicks or gallops Abdomen - soft, nontender, nondistended, no masses or organomegaly, nontender Neurological - alert, oriented, normal speech,  Extremities - peripheral pulses normal, no pedal edema, no clubbing or cyanosis Skin - normal coloration and turgor, no rashes,    ED Treatments / Results  Labs (all labs ordered are listed, but only abnormal results are displayed) Labs Reviewed  CBC WITH DIFFERENTIAL/PLATELET - Abnormal; Notable for the following components:      Result Value   Neutro Abs 8.6 (*)    All other components within normal limits  BASIC METABOLIC PANEL - Abnormal; Notable for the following components:   Sodium 125 (*)    Potassium 6.0 (*)    Chloride 90 (*)    CO2 14 (*)    Glucose, Bld 870 (*)    BUN 63 (*)    Creatinine, Ser 3.63 (*)    GFR calc non Af Amer 15 (*)    GFR calc Af Amer 17 (*)    Anion gap 21 (*)    All other components within normal limits  URINALYSIS, ROUTINE W REFLEX MICROSCOPIC - Abnormal; Notable for the following components:   Color, Urine STRAW (*)    Glucose, UA >=500 (*)    Hgb urine dipstick MODERATE (*)    Squamous Epithelial / LPF 0-5 (*)    All other components within normal limits  CBG MONITORING, ED - Abnormal; Notable for the following components:    Glucose-Capillary >600 (*)    All other components within normal limits  I-STAT TROPONIN, ED    EKG  EKG Interpretation  Date/Time:  Sunday July 30 2017 13:37:00 EST Ventricular Rate:  74 PR Interval:    QRS Duration: 154 QT Interval:  424 QTC Calculation: 471 R Axis:   16 Text Interpretation:  Sinus rhythm Left bundle branch block No significant change since last tracing Confirmed by Alfonzo Beers [phone]) on 07/30/2017 2:10:15 PM       Radiology No results found.  Procedures Procedures (including critical care time)  Medications Ordered in ED Medications  insulin regular (NOVOLIN R,HUMULIN R) 100 Units in sodium chloride 0.9 % 100 mL (1 Units/mL) infusion (5.5 Units/hr Intravenous New Bag/Given 07/30/17 1615)  sodium chloride 0.9 % bolus 1,000 mL (1,000 mLs Intravenous New Bag/Given 07/30/17 1542)  sodium chloride 0.9 % bolus 1,000 mL (1,000 mLs Intravenous New Bag/Given 07/30/17 1540)  insulin aspart (novoLOG) injection 10 Units (10 Units Intravenous Given 07/30/17 1540)    CRITICAL CARE Performed by: Marcha Dutton, Dayle Sherpa L Total critical care time: 40 minutes Critical care time was exclusive of separately billable procedures and treating other patients. Critical care was necessary to treat or prevent imminent or life-threatening deterioration. Critical care was time spent personally by me on the following activities: development of treatment plan with patient and/or surrogate as well as nursing, discussions with consultants, evaluation of patient's response to treatment, examination of patient, obtaining history from patient or surrogate, ordering and performing treatments and interventions, ordering and review of laboratory studies, ordering and review of radiographic studies, pulse oximetry and re-evaluation of patient's condition. Initial Impression / Assessment and Plan / ED Course  I have reviewed the triage vital signs and the nursing notes.  Pertinent labs & imaging  results that were available during my care of the patient were reviewed by me and considered in my medical decision making (see chart for details).    5:16 PM  D/w hospitalist for admission.    Patient presenting after near syncopal event today.  He states he has had no recent illness or changes in medications.  His blood pressure was systolic in the 30Z for EMS.  Blood pressure responded well to fluids.  On workup patient is found to have a glucose of 870.  He has an anion gap of 21.  He also has hyperkalemia at 6 and worsening in his baseline renal insufficiency.  Patient given IV fluids as well as started on insulin drip.  Admitted to hospitalist for further management.  Insulin should also treat the hyperkalemia and there are no signs of EKG changes.  Final Clinical Impressions(s) / ED Diagnoses   Final diagnoses:  Diabetic ketoacidosis without coma associated with diabetes mellitus due to underlying condition [facility])  Near syncope  Acute kidney injury [facility])  Hyperkalemia    ED Discharge Orders    None       Dora Clauss, Forbes Cellar, MD 07/30/17 1729

## 2017-07-30 NOTE — H&P (Signed)
History and Physical    Loyal Holzheimer JKK:[id number] DOB: 1940-09-14 DOA: 07/30/2017  PCP: [name], MD Consultants:  none Patient coming from: [place]   Chief Complaint: weakness, dizziness  HPI: David Ford is a 77 y.o. male with medical history significant of DM with known ketoacidoses, HYTN,Hyperlipidemia, CKD, prostate CA who was shopping in [place] today when he started feeling really weak, dizzy and experienced near syncopal spell. EMS was called and patient was delivered to the ED for further evaluation. Patient denied chest pain, SOB, cough, headache, myalgia or arthralgia, nausea, vomiting, abdominal pain.  ED Course: on arrival he had SBP in 80's, HR in 71, respirations 25. He was given IV NS 2L in boluses after which his BP improved to 135/93 mmHg Blood work demonstrated glucose 870 mg/dL, Anion gap 21, potassium 6.0, creatinine 3.63 (baseline 1.8 in May 2018) and BUN 63 CBC was normal EKG demonstrated sinus rhythm with left bundle branch block (old ), heart rate 74  Review of Systems: As per HPI; otherwise review of systems reviewed and negative.   Ambulatory Status: Ambulates without assistance  Past Medical History:  Diagnosis Date  . AKI (acute kidney injury) ([place]) 06/18/2015  . Cancer (HCC)    hx of prostate cancer  . Chronic kidney disease   . Diabetes mellitus without complication ([place])   . DKA (diabetic ketoacidoses) ([place]) 06/18/2015  . Glaucoma   . Hyperlipidemia   . Hypertension     Past Surgical History:  Procedure Laterality Date  . AMPUTATION Left 07/03/2015   Procedure: Left Foot 1st and 2nd Ray Amputation;  Surgeon: Newt Minion, MD;  Location: [facility];  Service: Orthopedics;  Laterality: Left;  . BASCILIC VEIN TRANSPOSITION Left 07/14/2015   Procedure: BRACHIOBASILIC VEIN TRANSPOSITION  ;  Surgeon: Elam Dutch, MD;  Location: [facility];  Service: Vascular;  Laterality: Left;  . prostate seeds      Social History   Socioeconomic History    . Marital status: Divorced    Spouse name: Not on file  . Number of children: Not on file  . Years of education: Not on file  . Highest education level: Not on file  Social Needs  . Financial resource strain: Not on file  . Food insecurity - worry: Not on file  . Food insecurity - inability: Not on file  . Transportation needs - medical: Not on file  . Transportation needs - non-medical: Not on file  Occupational History  . Not on file  Tobacco Use  . Smoking status: Never Smoker  . Smokeless tobacco: Never Used  Substance and Sexual Activity  . Alcohol use: Yes    Comment: once a week  . Drug use: No  . Sexual activity: Not on file  Other Topics Concern  . Not on file  Social History Narrative  . Not on file    No Known Allergies  Family History  Problem Relation Age of Onset  . Diabetes Mother   . Hypertension Mother   . Stroke Mother   . Diabetes Sister   . Diabetes Brother     Prior to Admission medications   Medication Sig Start Date End Date Taking? Authorizing Provider  amLODipine (NORVASC) 10 MG tablet Take 10 mg by mouth 2 (two) times daily.    Yes [provider]  atorvastatin (LIPITOR) 10 MG tablet Take 10 mg by mouth daily.   Yes [provider]  carvedilol (COREG) 3.125 MG tablet Take 1 tablet (3.125 mg  total) by mouth 2 (two) times daily with a meal. 06/02/17  Yes [name], MD  glimepiride (AMARYL) 4 MG tablet Take 1 tablet (4 mg total) by mouth 2 (two) times daily. 06/02/17  Yes [name], MD  lisinopril (PRINIVIL,ZESTRIL) 10 MG tablet Take 10 mg by mouth daily.   Yes [provider]  tamsulosin (FLOMAX) 0.4 MG CAPS capsule Take 1 capsule (0.4 mg total) by mouth daily. 06/02/17  Yes [name], MD  trazodone (DESYREL) 300 MG tablet Take 1 tablet (300 mg total) by mouth at bedtime. 07/03/17  Yes [name], MD  Vitamin D, Cholecalciferol, 1000 units CAPS Take 1,000 Units by mouth daily.   Yes [provider]    Physical Exam: Vitals:   07/30/17 1600 07/30/17 1615 07/30/17 1630 07/30/17 1645  BP: (!) 135/93 126/69 127/63 121/62  Pulse: 85 78 78 77  Resp: (!) 9 15 18 12   Temp:      TempSrc:      SpO2: 100% 97% 97% 98%  Weight:      Height:         General:  Appears calm and comfortable Eyes: PERRLA, sckerae unicteric, EOM intact,  ENT: normal external ear canals, oral mucous membranes moist and intact, no nasal discharge Neck: no lympnadenopathy, masses or thyromegaly Cardiovascular: RRR, no murmurs, gallops or rubs, no LE edema. Respiratory:  Right basilar rales and scattered rhonchi with faint wheezes, left base with crackles base auscultated. Normal respiratory effort. Abdomen: soft, NT / ND, BS (+) 4, no masses or organomegaly appreciated Skin: no rash or induration seen on limited exam Musculoskeletal: no joint deformities observed, active full ROM  Psychiatric: grossly normal mood and affect, speech fluent and appropriate Neurologic: CN II-XII grossly normal, no focal deficit visualized, moves all extremities independently, DTR and sensation intact.      Radiological Exams on Admission: No results found.  EKG: Independently reviewed.  NSR with rate 74; old left bundle branch block   Labs on Admission: I have personally reviewed the available labs and imaging studies at the time of the admission.  Pertinent labs:  Glucose 870 mg/dL, anion GAP 21 Creatinine 3.63 (base creatinine 1.8-2.1) Potassium 6.0    Assessment/Plan Principal Problem:   DM type 2, uncontrolled, with renal complications (HCC) Active Problems:   Hypertension associated with diabetes ([place])   Hyperkalemia   Atrial fibrillation ([place])   Acute kidney injury superimposed on CKD ([place])   Hyperglycemia     Diabetes mellitus type 2, uncontrolled - presented with significant hyperglycemia Patient was placed on insulin drip in the ED Given 2 L of normal saline in boluses We will need to  be followed with BMP every 6 hours to assure the closure of anion gap and resolution of severe hyperglycemia He also has feeling of early satiety and low appetite that could be a sighn of gastroparesis but denied nausea and epigastric pain.  Suspect CAP given lung ausculataion findings Chest x-ray ordered, will start nebulizer treatment and antibiotic therapy for community-acquired pneumonia  Acute dissipation of chronic kidney disease stage III Patient has a history of hemodialysis for AKI in the past Continue hydration and hopefully his creatinine returns to baseline  Hyperkalemia - now on insulin that will facilitate movement of potassium intracellularly Continue to follow level   DVT prophylaxis: Lovenox or SCDs Code Status: Full - confirmed with patient/family Family Communication: none Disposition Plan: Home once clinically improved Consults called: none Admission status: inpatient  York Grice PA-C [phone] [facility]  If note is complete, please contact covering daytime or nighttime physician. [email or web address] Password [email or web address]  07/30/2017, 5:18 PM

## 2017-07-31 DIAGNOSIS — E785 Hyperlipidemia, unspecified: Secondary | ICD-10-CM

## 2017-07-31 DIAGNOSIS — E1122 Type 2 diabetes mellitus with diabetic chronic kidney disease: Secondary | ICD-10-CM

## 2017-07-31 DIAGNOSIS — I129 Hypertensive chronic kidney disease with stage 1 through stage 4 chronic kidney disease, or unspecified chronic kidney disease: Secondary | ICD-10-CM

## 2017-07-31 DIAGNOSIS — N183 Chronic kidney disease, stage 3 (moderate): Secondary | ICD-10-CM

## 2017-07-31 LAB — BLOOD CULTURE ID PANEL (REFLEXED)
ACINETOBACTER BAUMANNII: NOT DETECTED
CANDIDA KRUSEI: NOT DETECTED
CARBAPENEM RESISTANCE: NOT DETECTED
Candida albicans: NOT DETECTED
Candida glabrata: NOT DETECTED
Candida parapsilosis: NOT DETECTED
Candida tropicalis: NOT DETECTED
ENTEROBACTER CLOACAE COMPLEX: NOT DETECTED
ENTEROBACTERIACEAE SPECIES: NOT DETECTED
Enterococcus species: NOT DETECTED
Escherichia coli: NOT DETECTED
Haemophilus influenzae: NOT DETECTED
KLEBSIELLA OXYTOCA: NOT DETECTED
Klebsiella pneumoniae: NOT DETECTED
Listeria monocytogenes: NOT DETECTED
Methicillin resistance: NOT DETECTED
NEISSERIA MENINGITIDIS: NOT DETECTED
PSEUDOMONAS AERUGINOSA: NOT DETECTED
Proteus species: NOT DETECTED
STAPHYLOCOCCUS AUREUS BCID: NOT DETECTED
STAPHYLOCOCCUS SPECIES: DETECTED — AB
STREPTOCOCCUS AGALACTIAE: NOT DETECTED
STREPTOCOCCUS SPECIES: NOT DETECTED
Serratia marcescens: NOT DETECTED
Streptococcus pneumoniae: NOT DETECTED
Streptococcus pyogenes: NOT DETECTED
Vancomycin resistance: NOT DETECTED

## 2017-07-31 LAB — GLUCOSE, CAPILLARY
GLUCOSE-CAPILLARY: 104 mg/dL — AB (ref 65–99)
GLUCOSE-CAPILLARY: 170 mg/dL — AB (ref 65–99)
GLUCOSE-CAPILLARY: 202 mg/dL — AB (ref 65–99)
GLUCOSE-CAPILLARY: 212 mg/dL — AB (ref 65–99)
GLUCOSE-CAPILLARY: 221 mg/dL — AB (ref 65–99)
Glucose-Capillary: 209 mg/dL — ABNORMAL HIGH (ref 65–99)
Glucose-Capillary: 224 mg/dL — ABNORMAL HIGH (ref 65–99)
Glucose-Capillary: 124 mg/dL — ABNORMAL HIGH (ref 65–99)
Glucose-Capillary: 113 mg/dL — ABNORMAL HIGH (ref 65–99)
Glucose-Capillary: 103 mg/dL — ABNORMAL HIGH (ref 65–99)
Glucose-Capillary: 120 mg/dL — ABNORMAL HIGH (ref 65–99)
Glucose-Capillary: 186 mg/dL — ABNORMAL HIGH (ref 65–99)
Glucose-Capillary: 314 mg/dL — ABNORMAL HIGH (ref 65–99)
Glucose-Capillary: 298 mg/dL — ABNORMAL HIGH (ref 65–99)

## 2017-07-31 LAB — CBC
HEMATOCRIT: 34 % — AB (ref 39.0–52.0)
Hemoglobin: 11.3 g/dL — ABNORMAL LOW (ref 13.0–17.0)
MCH: 28.1 pg (ref 26.0–34.0)
MCHC: 33.2 g/dL (ref 30.0–36.0)
MCV: 84.6 fL (ref 78.0–100.0)
Platelets: 245 10*3/uL (ref 150–400)
RBC: 4.02 MIL/uL — ABNORMAL LOW (ref 4.22–5.81)
RDW: 12.4 % (ref 11.5–15.5)
WBC: 10.6 10*3/uL — ABNORMAL HIGH (ref 4.0–10.5)

## 2017-07-31 LAB — BASIC METABOLIC PANEL
Anion gap: 13 (ref 5–15)
Anion gap: 14 (ref 5–15)
BUN: 48 mg/dL — AB (ref 6–20)
BUN: 53 mg/dL — ABNORMAL HIGH (ref 6–20)
CHLORIDE: 102 mmol/L (ref 101–111)
CO2: 17 mmol/L — ABNORMAL LOW (ref 22–32)
CO2: 18 mmol/L — ABNORMAL LOW (ref 22–32)
Calcium: 8.4 mg/dL — ABNORMAL LOW (ref 8.9–10.3)
Calcium: 8.6 mg/dL — ABNORMAL LOW (ref 8.9–10.3)
Chloride: 101 mmol/L (ref 101–111)
Creatinine, Ser: 2.4 mg/dL — ABNORMAL HIGH (ref 0.61–1.24)
Creatinine, Ser: 2.62 mg/dL — ABNORMAL HIGH (ref 0.61–1.24)
GFR calc Af Amer: 26 mL/min — ABNORMAL LOW (ref >60)
GFR calc Af Amer: 29 mL/min — ABNORMAL LOW (ref >60)
GFR calc non Af Amer: 22 mL/min — ABNORMAL LOW (ref >60)
GFR calc non Af Amer: 25 mL/min — ABNORMAL LOW (ref >60)
GLUCOSE: 104 mg/dL — AB (ref 65–99)
Glucose, Bld: 217 mg/dL — ABNORMAL HIGH (ref 65–99)
POTASSIUM: 3.9 mmol/L (ref 3.5–5.1)
Potassium: 3.9 mmol/L (ref 3.5–5.1)
Sodium: 132 mmol/L — ABNORMAL LOW (ref 135–145)
Sodium: 133 mmol/L — ABNORMAL LOW (ref 135–145)

## 2017-07-31 LAB — HEMOGLOBIN A1C
HEMOGLOBIN A1C: 15.7 % — AB (ref 4.8–5.6)
MEAN PLASMA GLUCOSE: 403.89 mg/dL

## 2017-07-31 LAB — MRSA PCR SCREENING: MRSA by PCR: NEGATIVE

## 2017-07-31 MED ORDER — INSULIN ASPART 100 UNIT/ML ~~LOC~~ SOLN
0.0000 [IU] | Freq: Every day | SUBCUTANEOUS | Status: DC
  Administered 2017-07-31: 3 [IU] via SUBCUTANEOUS

## 2017-07-31 MED ORDER — INSULIN GLARGINE 100 UNIT/ML ~~LOC~~ SOLN
25.0000 [IU] | Freq: Every day | SUBCUTANEOUS | Status: DC
  Administered 2017-07-31 – 2017-08-01 (×2): 25 [IU] via SUBCUTANEOUS
  Filled 2017-07-31 (×2): qty 0.25

## 2017-07-31 MED ORDER — INSULIN ASPART 100 UNIT/ML ~~LOC~~ SOLN
0.0000 [IU] | Freq: Three times a day (TID) | SUBCUTANEOUS | Status: DC
  Administered 2017-07-31: 11 [IU] via SUBCUTANEOUS
  Administered 2017-08-01 (×2): 5 [IU] via SUBCUTANEOUS

## 2017-07-31 MED ORDER — SODIUM CHLORIDE 0.9 % IV SOLN
INTRAVENOUS | Status: AC
  Administered 2017-07-31: 18:00:00 via INTRAVENOUS

## 2017-07-31 MED ORDER — IPRATROPIUM-ALBUTEROL 0.5-2.5 (3) MG/3ML IN SOLN: 3.0000 mL | Freq: Four times a day (QID) | RESPIRATORY_TRACT | Status: DC | PRN

## 2017-07-31 MED ORDER — SODIUM CHLORIDE 0.9 % IV SOLN: 500.0000 mg | INTRAVENOUS | Status: DC

## 2017-07-31 MED ORDER — SODIUM CHLORIDE 0.9 % IV SOLN: 1.0000 g | INTRAVENOUS | Status: DC

## 2017-07-31 NOTE — Care Management Note (Signed)
Case Management Note  Patient Details  Name: David Ford MRN: [id number] Date of Birth: 04/24/1941  Subjective/Objective:  From home alone, pta indep, presents with DKA, on insulin drip, when off drip will transfer to floor,  he did not use any DME pta.  He has PCP and medication coverage.                    Action/Plan: NCM will follow for dc needs.   Expected Discharge Date:                  Expected Discharge Plan:  Home/Self Care  In-House Referral:     Discharge planning Services  CM Consult  Post Acute Care Choice:    Choice offered to:     DME Arranged:    DME Agency:     HH Arranged:    HH Agency:     Status of Service:  In process, will continue to follow  If discussed at Long Length of Stay Meetings, dates discussed:    Additional Comments:  Zenon Mayo, RN 07/31/2017, 2:33 PM

## 2017-07-31 NOTE — Progress Notes (Signed)
Pt has a left arm AV fistula. No bruit/thrill felt or heard. Pt states fistula has not been used in 2-3 years, only had temporary dialysis. Restricted extremity bracelet placed on left arm. No complications at this time. Will continue to monitor.

## 2017-07-31 NOTE — Progress Notes (Signed)
MD- If you decide to send pt home on insulin, patient would prefer insulin pens: Lantus pen- Order # [id number] Novolog pen- Order # [id number] Insulin Pen needles- Order # [id number]    Spoke with patient about his current A1c of 15.7%.  Explained what an A1c is and what it measures.  Reminded patient that his goal A1c is 7% or less per ADA standards to prevent both acute and long-term complications.  Explained to patient the extreme importance of good glucose control at home.  Encouraged patient to check his CBGs at least [facility] at home and to record all CBGs in a logbook for his PCP to review.  Pt stated he only checks CBGs once weekly.  CBGs are usually 250 mg/dl range.  Has meter at home.  Stated he knows he needs to check more often.  Just started seeing Dr. Jerline Pain with Family Medicine back in December.  Has follow-up appt in March.  Encouraged pt to make hospital follow-up appt with Dr. Jerline Pain soon after d/c.  Patient stated he has used insulin pens in the past and is comfortable using insulin pens again of needed.  Re-Educated patient on insulin pen use at home.  Reviewed all steps of insulin pen including attachment of needle, 2-unit air shot, dialing up dose, giving injection, removing needle, disposal of sharps, storage of unused insulin, disposal of insulin etc.  Patient able to provide successful return demonstration.  Reviewed troubleshooting with insulin pen.  Also reviewed Signs/Symptoms of Hypoglycemia with patient and how to treat Hypoglycemia at home.    Have asked RNs caring for patient to please allow patient to give all injections here in hospital as much as possible for practice.  MD to give patient Rxs for insulin pens and insulin pen needles.       --Will follow patient during hospitalization--  Wyn Quaker RN, MSN, CDE Diabetes Coordinator Inpatient Glycemic Control Team Team Pager: [phone] (8a-5p)

## 2017-07-31 NOTE — Progress Notes (Addendum)
Inpatient Diabetes Program Recommendations  AACE/ADA: New Consensus Statement on Inpatient Glycemic Control (2015)  Target Ranges:  Prepandial:   less than 140 mg/dL      Peak postprandial:   less than 180 mg/dL (1-2 hours)      Critically ill patients:  140 - 180 mg/dL   Results for David Ford, David Ford (MRN [id number]) as of 07/31/2017 11:07  Ref. Range 09/14/2016 00:00 01/23/2017 00:00 06/02/2017 09:38 07/31/2017 06:58  Hemoglobin A1C Latest Ref Range: 4.8 - 5.6 % 7.3 8.6 9.3 15.7 (H)    Admit with: DKA  History: DM, CKD  Home DM Meds: Amaryl 4 mg BID  Current Insulin Orders: Lantus 25 units daily      Novolog Moderate Correction Scale/ SSI (0-15 units) TID AC + HS     Note Lantus 25 units daily started at 10am today to transition off IV insulin drip.  Novolog to start at 12pm.  Per MD notes, patient has taken insulin in the past and is willing to take again if needed.  Current A1c= 15.7% (average glucose 403 mg/dl).  Will have RNs begin insulin education with pt to make sure he can adequately give insulin.  Last seen by PCP (Dr. Dimas Chyle with [facility]) on 06/02/2017.  Plan to see pt today to discuss restarting insulin at home.    --Will follow patient during hospitalization--  Wyn Quaker RN, MSN, CDE Diabetes Coordinator Inpatient Glycemic Control Team Team Pager: [phone] (8a-5p)

## 2017-07-31 NOTE — Progress Notes (Signed)
Nutrition Brief Note  Patient identified on the Malnutrition Screening Tool (MST) Report.  Wt Readings from Last 15 Encounters:  07/30/17 211 lb (95.7 kg)  06/02/17 211 lb (95.7 kg)  09/04/15 194 lb 1.6 oz (88 kg)  07/25/15 215 lb (97.5 kg)  07/14/15 215 lb 9.8 oz (97.8 kg)   Body mass index is 27.84 kg/m. Patient meets criteria for Overweight based on current BMI.   Lab Results  Component Value Date   HGBA1C 15.7 (H) 07/31/2017   RD provided "Carbohydrate Counting for People with Diabetes" handout from the Academy of Nutrition and Dietetics. Discussed different food groups and their effects on blood sugar, emphasizing carbohydrate-containing foods. Provided list of carbohydrates and recommended serving sizes of common foods.  Current diet order is Carbohydrate Modified, patient is consuming approximately 75% of meals at this time. Labs and medications reviewed.   No further nutrition interventions warranted at this time. If nutrition issues arise, please consult RD.   Arthur Holms, RD, LDN Pager #: [phone] After-Hours Pager #: [phone]

## 2017-07-31 NOTE — Progress Notes (Addendum)
PHARMACY - PHYSICIAN COMMUNICATION CRITICAL VALUE ALERT - BLOOD CULTURE IDENTIFICATION (BCID)  David Ford is an 77 y.o. male who presented to [facility] on 07/30/2017 with a chief complaint of weakness/dizziness  Assessment:  28 YOM admitted with symptoms related to uncontrolled DM - with 1 of 2 blood cultures growing Coag neg Staph - likely representing a contaminant.   Name of physician (or Provider) Contacted: Broadus John (via text page)  Current antibiotics: None   Changes to prescribed antibiotics recommended: No changes needed - off antibiotics - likely just a contaminant   Results for orders placed or performed during the hospital encounter of 07/30/17  Blood Culture ID Panel (Reflexed) (Collected: 07/30/2017  7:22 PM)  Result Value Ref Range   Enterococcus species NOT DETECTED NOT DETECTED   Vancomycin resistance NOT DETECTED NOT DETECTED   Listeria monocytogenes NOT DETECTED NOT DETECTED   Staphylococcus species DETECTED (A) NOT DETECTED   Staphylococcus aureus NOT DETECTED NOT DETECTED   Methicillin resistance NOT DETECTED NOT DETECTED   Streptococcus species NOT DETECTED NOT DETECTED   Streptococcus agalactiae NOT DETECTED NOT DETECTED   Streptococcus pneumoniae NOT DETECTED NOT DETECTED   Streptococcus pyogenes NOT DETECTED NOT DETECTED   Acinetobacter baumannii NOT DETECTED NOT DETECTED   Enterobacteriaceae species NOT DETECTED NOT DETECTED   Enterobacter cloacae complex NOT DETECTED NOT DETECTED   Escherichia coli NOT DETECTED NOT DETECTED   Klebsiella oxytoca NOT DETECTED NOT DETECTED   Klebsiella pneumoniae NOT DETECTED NOT DETECTED   Proteus species NOT DETECTED NOT DETECTED   Serratia marcescens NOT DETECTED NOT DETECTED   Carbapenem resistance NOT DETECTED NOT DETECTED   Haemophilus influenzae NOT DETECTED NOT DETECTED   Neisseria meningitidis NOT DETECTED NOT DETECTED   Pseudomonas aeruginosa NOT DETECTED NOT DETECTED   Candida albicans NOT DETECTED NOT DETECTED    Candida glabrata NOT DETECTED NOT DETECTED   Candida krusei NOT DETECTED NOT DETECTED   Candida parapsilosis NOT DETECTED NOT DETECTED   Candida tropicalis NOT DETECTED NOT DETECTED    [name] 07/31/2017  2:23 PM

## 2017-07-31 NOTE — Progress Notes (Addendum)
PROGRESS NOTE    David Ford  JJK:[id number] DOB: 01/18/1941 DOA: 07/30/2017 PCP: [name], MD  Brief Narrative:77 year old gentleman with known diabetes and stage III chronic kidney disease previously on dialysis now off who comes in complaining of weakness. He was noted to be markedly hyperglycemic with sugar greater than 800, and anion gap of 21 consistent with diabetic ketoacidosis  Assessment & Plan:     Diabetic ketoacidosis/Uncontrolled DM -Trigger not clear at this time -Baseline CBGs are not controlled and last hemoglobin A1c in our system is 8.6 from 01/2017 -Anion gap is corrected, transitioned to Lantus and sliding-scale insulin -Insulin teaching, diabetes coordinator consult -Hemoglobin A1c is 15.5 and needs to start Insulin at discharge, Insulin teaching  Acute kidney disease on CK D stage III -Baseline creatinine around 2.0 -worsened due to dehydration and ACE inhibitor, DKA -creatinine not back to baseline yet, need to continue IV fluids today, lisinopril held  -B met in a.m, start diet, Po fluid intake not adequate at this time  Doubt pneumonia -Lungs are clear, no cough or wheezing, and chest x-rays unremarkable -Stop antibiotics and monitor   hyperkalemia  -Due to acute kidney injury -Corrected   history of prostate cancer and BPH -Continue Flomax  Hypertension -Stable, continue amlodipine and Coreg - Lisinopril held   DVT prophylaxis: heparin subcutaneous  Code Status:  full code  Family Communication: no family at bedside  Disposition Plan: Transfer to Floor     Antimicrobials:  -Ceftriaxone and azithromycin 2/10-2/11  Subjective: -feels well, no complaints, denies any cough, shortness of breath or wheezing  Objective: Vitals:   07/31/17 0653 07/31/17 0700 07/31/17 0800 07/31/17 0822  BP: 120/65 126/68 (!) 141/80 (!) 141/80  Pulse:  69 92 80  Resp: 20 14  18   Temp: 98.9 F (37.2 C)     TempSrc: Oral     SpO2: 99% 96% 96% 99%    Weight:      Height:        Intake/Output Summary (Last 24 hours) at 07/31/2017 1021 Last data filed at 07/31/2017 0406 Gross per 24 hour  Intake 2989.13 ml  Output 175 ml  Net 2814.13 ml   Filed Weights   07/30/17 1337  Weight: 95.7 kg (211 lb)    Examination:  General exam: Appears calm and comfortable, no distress Respiratory system: Clear to auscultation. Respiratory effort normal. Cardiovascular system: S1 & S2 heard, RRR.  Gastrointestinal system: Abdomen is nondistended, soft and nontender.Normal bowel sounds heard. Central nervous system: Alert and oriented. No focal neurological deficits. Extremities: Symmetric 5 x 5 power. Skin: No rashes, lesions or ulcers Psychiatry: Judgement and insight appear normal. Mood & affect appropriate.     Data Reviewed:   CBC: Recent Labs  Lab 07/30/17 1357 07/30/17 1924 07/31/17 0658  WBC 10.4 12.7* 10.6*  NEUTROABS 8.6*  --   --   HGB 13.3 12.5* 11.3*  HCT 41.3 37.5* 34.0*  MCV 88.6 83.9 84.6  PLT 265 250 371   Basic Metabolic Panel: Recent Labs  Lab 07/30/17 1357 07/30/17 1924 07/30/17 2344 07/31/17 0658  NA 125*  --  132* 133*  K 6.0*  --  3.9 3.9  CL 90*  --  101 102  CO2 14*  --  17* 18*  GLUCOSE 870*  --  217* 104*  BUN 63*  --  53* 48*  CREATININE 3.63* 2.97* 2.62* 2.40*  CALCIUM 9.0  --  8.6* 8.4*   GFR: Estimated Creatinine Clearance: 30.1 mL/min (A) (by  C-G formula based on SCr of 2.4 mg/dL (H)). Liver Function Tests: No results for input(s): AST, ALT, ALKPHOS, BILITOT, PROT, ALBUMIN in the last 168 hours. No results for input(s): LIPASE, AMYLASE in the last 168 hours. No results for input(s): AMMONIA in the last 168 hours. Coagulation Profile: No results for input(s): INR, PROTIME in the last 168 hours. Cardiac Enzymes: No results for input(s): CKTOTAL, CKMB, CKMBINDEX, TROPONINI in the last 168 hours. BNP (last 3 results) No results for input(s): PROBNP in the last 8760 hours. HbA1C: Recent  Labs    07/31/17 0658  HGBA1C 15.7*   CBG: Recent Labs  Lab 07/31/17 0358 07/31/17 0511 07/31/17 0555 07/31/17 0652 07/31/17 0758  GLUCAP 170* 124* 113* 103* 104*   Lipid Profile: No results for input(s): CHOL, HDL, LDLCALC, TRIG, CHOLHDL, LDLDIRECT in the last 72 hours. Thyroid Function Tests: No results for input(s): TSH, T4TOTAL, FREET4, T3FREE, THYROIDAB in the last 72 hours. Anemia Panel: No results for input(s): VITAMINB12, FOLATE, FERRITIN, TIBC, IRON, RETICCTPCT in the last 72 hours. Urine analysis:    Component Value Date/Time   COLORURINE STRAW (A) 07/30/2017 1610   APPEARANCEUR CLEAR 07/30/2017 1610   LABSPEC 1.023 07/30/2017 1610   PHURINE 5.0 07/30/2017 1610   GLUCOSEU >=500 (A) 07/30/2017 1610   HGBUR MODERATE (A) 07/30/2017 1610   BILIRUBINUR NEGATIVE 07/30/2017 1610   KETONESUR NEGATIVE 07/30/2017 1610   PROTEINUR NEGATIVE 07/30/2017 1610   NITRITE NEGATIVE 07/30/2017 1610   LEUKOCYTESUR NEGATIVE 07/30/2017 1610   Sepsis Labs: @LABRCNTIP (procalcitonin:4,lacticidven:4)  ) Recent Results (from the past 240 hour(s))  Culture, blood (Routine X 2) w Reflex to ID Panel     Status: None (Preliminary result)   Collection Time: 07/30/17  7:22 PM  Result Value Ref Range Status   Specimen Description BLOOD LEFT HAND  Final   Special Requests   Final    IN PEDIATRIC BOTTLE Blood Culture adequate volume Performed at [facility], [place] [address], [place], [place] [address]    Culture PENDING  Incomplete   Report Status PENDING  Incomplete         Radiology Studies: Dg Chest Port 1 View  Result Date: 07/30/2017 CLINICAL DATA:  [place] EXAM: PORTABLE CHEST 1 VIEW COMPARISON:  07/25/2015 FINDINGS: The cardiac silhouette is normal in size. No mediastinal or hilar masses. No convincing adenopathy. Lungs are mildly hyperexpanded but clear. No pleural effusion or pneumothorax. Skeletal structures are grossly intact. IMPRESSION: No active disease.  Electronically Signed   By: Lajean Manes M.D.   On: 07/30/2017 21:38        Scheduled Meds: . amLODipine  10 mg Oral Daily  . atorvastatin  10 mg Oral Daily  . carvedilol  3.125 mg Oral BID  . heparin  5,000 Units Subcutaneous Q8H  . insulin aspart  0-15 Units Subcutaneous TID WC  . insulin aspart  0-5 Units Subcutaneous QHS  . insulin glargine  25 Units Subcutaneous Daily  . insulin regular  0-10 Units Intravenous TID WC  . lisinopril  10 mg Oral Daily  . tamsulosin  0.4 mg Oral Daily   Continuous Infusions: . sodium chloride 75 mL/hr at 07/31/17 0928  . insulin (NOVOLIN-R) infusion 1.5 Units/hr (07/31/17 0656)  . sodium chloride       LOS: 1 day    Time spent: 74mn    PDomenic Polite MD [facility] Page via [email or web address], password [email or web address] After 7PM please contact night-coverage  07/31/2017, 10:21 AM

## 2017-08-01 LAB — CBC
HCT: 31.1 % — ABNORMAL LOW (ref 39.0–52.0)
HEMOGLOBIN: 10.1 g/dL — AB (ref 13.0–17.0)
MCH: 27.7 pg (ref 26.0–34.0)
MCHC: 32.5 g/dL (ref 30.0–36.0)
MCV: 85.2 fL (ref 78.0–100.0)
Platelets: 200 10*3/uL (ref 150–400)
RBC: 3.65 MIL/uL — AB (ref 4.22–5.81)
RDW: 12.7 % (ref 11.5–15.5)
WBC: 9.4 10*3/uL (ref 4.0–10.5)

## 2017-08-01 LAB — BASIC METABOLIC PANEL
Anion gap: 11 (ref 5–15)
BUN: 40 mg/dL — AB (ref 6–20)
CHLORIDE: 103 mmol/L (ref 101–111)
CO2: 17 mmol/L — AB (ref 22–32)
Calcium: 8.1 mg/dL — ABNORMAL LOW (ref 8.9–10.3)
Creatinine, Ser: 2.07 mg/dL — ABNORMAL HIGH (ref 0.61–1.24)
GFR calc Af Amer: 34 mL/min — ABNORMAL LOW (ref >60)
GFR calc non Af Amer: 30 mL/min — ABNORMAL LOW (ref >60)
GLUCOSE: 226 mg/dL — AB (ref 65–99)
POTASSIUM: 4.5 mmol/L (ref 3.5–5.1)
Sodium: 131 mmol/L — ABNORMAL LOW (ref 135–145)

## 2017-08-01 LAB — GLUCOSE, CAPILLARY
Glucose-Capillary: 230 mg/dL — ABNORMAL HIGH (ref 65–99)
Glucose-Capillary: 209 mg/dL — ABNORMAL HIGH (ref 65–99)

## 2017-08-01 MED ORDER — INSULIN GLARGINE 100 UNIT/ML ~~LOC~~ SOLN
10.0000 [IU] | Freq: Once | SUBCUTANEOUS | Status: AC
  Administered 2017-08-01: 10 [IU] via SUBCUTANEOUS
  Filled 2017-08-01: qty 0.1

## 2017-08-01 MED ORDER — INSULIN ASPART 100 UNIT/ML FLEXPEN: 6.0000 [IU] | PEN_INJECTOR | Freq: Three times a day (TID) | SUBCUTANEOUS | 1 refills | Status: DC

## 2017-08-01 MED ORDER — INSULIN GLARGINE 100 UNIT/ML ~~LOC~~ SOLN
35.0000 [IU] | Freq: Every day | SUBCUTANEOUS | Status: DC
  Filled 2017-08-01: qty 0.35

## 2017-08-01 MED ORDER — INSULIN GLARGINE 100 UNIT/ML SOLOSTAR PEN: 35.0000 [IU] | PEN_INJECTOR | Freq: Every morning | SUBCUTANEOUS | 2 refills | Status: DC

## 2017-08-01 NOTE — Discharge Summary (Signed)
Physician Discharge Summary  David Ford KVQ:[id number] DOB: 1940-10-31 DOA: 07/30/2017  PCP: [name], MD  Admit date: 07/30/2017 Discharge date: 08/01/2017  Time spent: 45 minutes  Recommendations for Outpatient Follow-up:  1. PCP in one week, please monitor her CBGs, check compliance, newly restarted on insulin 2. Home health RN for continued insulin teaching  Discharge Diagnoses:  Principal Problem:   DM type 2, uncontrolled, with renal complications ([place]) Active Problems:   Hypertension associated with diabetes ([place])   Hyperkalemia   Atrial fibrillation ([place])   Acute kidney injury ([place])   Hyperglycemia   Discharge Condition: stable  Diet recommendation: diabetic, low sodium heart healthy  Filed Weights   07/30/17 1337 07/31/17 2109  Weight: 95.7 kg (211 lb) 90.8 kg (200 lb 2.8 oz)    History of present illness:  77 year old gentleman with known diabetes and stage III chronic kidney disease previously on dialysis now off who comes in complaining of weakness. He was noted to be markedly hyperglycemic with sugar greater than 800, and anion gap of 21 consistent with diabetic ketoacidosis  Hospital Course:   Diabetic ketoacidosis/Uncontrolled DM -admitted with weakness, CBGs greater than 800 and anion gap greater than 20 -Trigger not clear at this time -treated with insulin drip, fluids and electrolytes -DKA resolved, transitioned to Lantus and NovoLog -Baseline CBGs uncontrolled, hemoglobin A1c was 15.5, insulin teaching completed, diabetes coordinator consulted with the patient, he has used insulin in the past and is comfortable going back on insulin, Lantus and NovoLog pens used at discharge, due to ease of administration -advised patient to keep a log of his CBGs, educated patient on symptoms of hypoglycemia, advised quick follow-up with PCP in one week with a lot of CBGs -Also home health nursing set up to assist the patient with this  Acute kidney disease on  CK D stage III -Baseline creatinine around 2.0 -worsened due to dehydration and ACE inhibitor, DKA -IV fluids improved with hydration back to baseline of 2 -lisinopril stopped at discharge  Doubt pneumonia -Lungs are clear, no cough or wheezing, and chest x-rays unremarkable -remained stable off antibiotics   hyperkalemia  -Due to acute kidney injury -Corrected   history of prostate cancer and BPH -Continue Flomax  Hypertension -Stable, continue amlodipine and Coreg - Lisinopril stopped      Discharge Exam: Vitals:   08/01/17 0536 08/01/17 0841  BP: (!) 100/54 110/62  Pulse: 69 65  Resp: 18   Temp: 98.9 F (37.2 C)   SpO2: 97% 99%    General: alert awake oriented 3 Cardiovascular: S1-S2 regular rate rhythm Respiratory: clear bilaterally  Discharge Instructions   Discharge Instructions    Diet - low sodium heart healthy   Complete by:  As directed    Diet Carb Modified   Complete by:  As directed    Increase activity slowly   Complete by:  As directed      Allergies as of 08/01/2017   No Known Allergies     Medication List    STOP taking these medications   lisinopril 10 MG tablet Commonly known as:  PRINIVIL,ZESTRIL     TAKE these medications   amLODipine 10 MG tablet Commonly known as:  NORVASC Take 10 mg by mouth 2 (two) times daily.   atorvastatin 10 MG tablet Commonly known as:  LIPITOR Take 10 mg by mouth daily.   carvedilol 3.125 MG tablet Commonly known as:  COREG Take 1 tablet (3.125 mg total) by mouth 2 (two) times daily  with a meal.   glimepiride 4 MG tablet Commonly known as:  AMARYL Take 1 tablet (4 mg total) by mouth 2 (two) times daily.   insulin aspart 100 UNIT/ML FlexPen Commonly known as:  NOVOLOG FLEXPEN Inject 6 Units into the skin 3 (three) times daily with meals.   Insulin Glargine 100 UNIT/ML Solostar Pen Commonly known as:  LANTUS SOLOSTAR Inject 35 Units into the skin every morning.   tamsulosin 0.4  MG Caps capsule Commonly known as:  FLOMAX Take 1 capsule (0.4 mg total) by mouth daily.   trazodone 300 MG tablet Commonly known as:  DESYREL Take 1 tablet (300 mg total) by mouth at bedtime.   Vitamin D (Cholecalciferol) 1000 units Caps Take 1,000 Units by mouth daily.      No Known Allergies Follow-up Information    [facility]-Home Follow up.   Specialty:  [place] Why:  home health services arranged, office will call and setup home vists Contact information: [address] [place] [address] [phone]            The results of significant diagnostics from this hospitalization (including imaging, microbiology, ancillary and laboratory) are listed below for reference.    Significant Diagnostic Studies: Dg Chest Port 1 View  Result Date: 07/30/2017 CLINICAL DATA:  [place] EXAM: PORTABLE CHEST 1 VIEW COMPARISON:  07/25/2015 FINDINGS: The cardiac silhouette is normal in size. No mediastinal or hilar masses. No convincing adenopathy. Lungs are mildly hyperexpanded but clear. No pleural effusion or pneumothorax. Skeletal structures are grossly intact. IMPRESSION: No active disease. Electronically Signed   By: Lajean Manes M.D.   On: 07/30/2017 21:38    Microbiology: Recent Results (from the past 240 hour(s))  Culture, blood (Routine X 2) w Reflex to ID Panel     Status: None (Preliminary result)   Collection Time: 07/30/17  7:22 PM  Result Value Ref Range Status   Specimen Description BLOOD LEFT HAND  Final   Special Requests   Final    IN PEDIATRIC BOTTLE Blood Culture adequate volume Performed at [facility], [address], [place], [place] [address]    Culture  Setup Time   Final    GRAM POSITIVE COCCI IN CLUSTERS IN PEDIATRIC BOTTLE Organism ID to follow CRITICAL RESULT CALLED TO, READ BACK BY AND VERIFIED WITH: Alvino Chapel.D. 14:20 07/31/17 ([email or web address])    Culture GRAM POSITIVE COCCI  Final   Report Status PENDING   Incomplete  Blood Culture ID Panel (Reflexed)     Status: Abnormal   Collection Time: 07/30/17  7:22 PM  Result Value Ref Range Status   Enterococcus species NOT DETECTED NOT DETECTED Final   Vancomycin resistance NOT DETECTED NOT DETECTED Final   Listeria monocytogenes NOT DETECTED NOT DETECTED Final   Staphylococcus species DETECTED (A) NOT DETECTED Final    Comment: CRITICAL RESULT CALLED TO, READ BACK BY AND VERIFIED WITH: Alvino Chapel.D. 14:20 07/31/17 ([email or web address]) Methicillin (oxacillin) susceptible coagulase negative staphylococcus. Possible blood culture contaminant (unless isolated from more than one blood culture draw or clinical case suggests pathogenicity). No antibiotic treatment is indicated for blood  culture contaminants.    Staphylococcus aureus NOT DETECTED NOT DETECTED Final   Methicillin resistance NOT DETECTED NOT DETECTED Final   Streptococcus species NOT DETECTED NOT DETECTED Final   Streptococcus agalactiae NOT DETECTED NOT DETECTED Final   Streptococcus pneumoniae NOT DETECTED NOT DETECTED Final   Streptococcus pyogenes NOT DETECTED NOT DETECTED Final  Acinetobacter baumannii NOT DETECTED NOT DETECTED Final   Enterobacteriaceae species NOT DETECTED NOT DETECTED Final   Enterobacter cloacae complex NOT DETECTED NOT DETECTED Final   Escherichia coli NOT DETECTED NOT DETECTED Final   Klebsiella oxytoca NOT DETECTED NOT DETECTED Final   Klebsiella pneumoniae NOT DETECTED NOT DETECTED Final   Proteus species NOT DETECTED NOT DETECTED Final   Serratia marcescens NOT DETECTED NOT DETECTED Final   Carbapenem resistance NOT DETECTED NOT DETECTED Final   Haemophilus influenzae NOT DETECTED NOT DETECTED Final   Neisseria meningitidis NOT DETECTED NOT DETECTED Final   Pseudomonas aeruginosa NOT DETECTED NOT DETECTED Final   Candida albicans NOT DETECTED NOT DETECTED Final   Candida glabrata NOT DETECTED NOT DETECTED Final   Candida krusei NOT DETECTED NOT DETECTED Final    Candida parapsilosis NOT DETECTED NOT DETECTED Final   Candida tropicalis NOT DETECTED NOT DETECTED Final  Culture, blood (Routine X 2) w Reflex to ID Panel     Status: None (Preliminary result)   Collection Time: 07/30/17  9:26 PM  Result Value Ref Range Status   Specimen Description BLOOD LEFT HAND  Final   Special Requests   Final    BOTTLES DRAWN AEROBIC ONLY Blood Culture adequate volume   Culture   Final    NO GROWTH 2 DAYS Performed at [facility], [address], [place], [place] [address]    Report Status PENDING  Incomplete  MRSA PCR Screening     Status: None   Collection Time: 07/31/17 11:45 AM  Result Value Ref Range Status   MRSA by PCR NEGATIVE NEGATIVE Final    Comment:        The GeneXpert MRSA Assay (FDA approved for NASAL specimens only), is one component of a comprehensive MRSA colonization surveillance program. It is not intended to diagnose MRSA infection nor to guide or monitor treatment for MRSA infections. Performed at [facility], [place] [address] [place], [place] [address]      Labs: Basic Metabolic Panel: Recent Labs  Lab 07/30/17 1357 07/30/17 1924 07/30/17 2344 07/31/17 0658 08/01/17 0453  NA 125*  --  132* 133* 131*  K 6.0*  --  3.9 3.9 4.5  CL 90*  --  101 102 103  CO2 14*  --  17* 18* 17*  GLUCOSE 870*  --  217* 104* 226*  BUN 63*  --  53* 48* 40*  CREATININE 3.63* 2.97* 2.62* 2.40* 2.07*  CALCIUM 9.0  --  8.6* 8.4* 8.1*   Liver Function Tests: No results for input(s): AST, ALT, ALKPHOS, BILITOT, PROT, ALBUMIN in the last 168 hours. No results for input(s): LIPASE, AMYLASE in the last 168 hours. No results for input(s): AMMONIA in the last 168 hours. CBC: Recent Labs  Lab 07/30/17 1357 07/30/17 1924 07/31/17 0658 08/01/17 0453  WBC 10.4 12.7* 10.6* 9.4  NEUTROABS 8.6*  --   --   --   HGB 13.3 12.5* 11.3* 10.1*  HCT 41.3 37.5* 34.0* 31.1*  MCV 88.6 83.9 84.6 85.2  PLT 265 250 245 200   Cardiac  Enzymes: No results for input(s): CKTOTAL, CKMB, CKMBINDEX, TROPONINI in the last 168 hours. BNP: BNP (last 3 results) No results for input(s): BNP in the last 8760 hours.  ProBNP (last 3 results) No results for input(s): PROBNP in the last 8760 hours.  CBG: Recent Labs  Lab 07/31/17 1203 07/31/17 1558 07/31/17 2108 08/01/17 0756 08/01/17 1210  GLUCAP 221* 314* 298* 230* 209*  Signed:  Domenic Polite MD.  [facility] 08/01/2017, 2:20 PM

## 2017-08-01 NOTE — Progress Notes (Signed)
David Ford to be D/C'd Home per MD order.  Discussed with the patient and all questions fully answered.  VSS, Skin clean, dry and intact without evidence of skin break down, no evidence of skin tears noted. IV catheter discontinued intact. Site without signs and symptoms of complications. Dressing and pressure applied.  An After Visit Summary was printed and given to the patient. Patient received prescription.  D/c education completed with patient/family including follow up instructions, medication list, d/c activities limitations if indicated, with other d/c instructions as indicated by MD - patient able to verbalize understanding, all questions fully answered.   Patient instructed to return to ED, call 911, or call MD for any changes in condition.   Patient escorted via [place], and D/C home via private auto.  [name] 08/01/2017 1:14 PM

## 2017-08-01 NOTE — Progress Notes (Signed)
MD notified of blood pressure of 110/62. And I asked if I should hold his amlodipine and carvedilol. MD said to give carvedilol but hold amlodipine.

## 2017-08-01 NOTE — Progress Notes (Signed)
Patient is waiting on his son to arrive before discharge teaching is done.

## 2017-08-01 NOTE — Progress Notes (Signed)
Patient has been giving himself his own insulin with my help.

## 2017-08-01 NOTE — Progress Notes (Signed)
Inpatient Diabetes Program Recommendations  AACE/ADA: New Consensus Statement on Inpatient Glycemic Control (2015)  Target Ranges:  Prepandial:   less than 140 mg/dL      Peak postprandial:   less than 180 mg/dL (1-2 hours)      Critically ill patients:  140 - 180 mg/dL   Lab Results  Component Value Date   GLUCAP 230 (H) 08/01/2017   HGBA1C 15.7 (H) 07/31/2017    Review of Glycemic Control Results for David Ford, David Ford (MRN [id number]) as of 08/01/2017 10:32  Ref. Range 07/31/2017 15:58 07/31/2017 21:08 08/01/2017 04:53 08/01/2017 07:56  Glucose-Capillary Latest Ref Range: 65 - 99 mg/dL 314 (H) 298 (H)  230 (H)   History: DM, CKD Home DM Meds: Amaryl 4 mg BID Current Insulin Orders: Lantus 35 units daily                                       Novolog Moderate Correction Scale/ SSI (0-15 units) TID AC + HS     Inpatient Diabetes Program Recommendations:    Noted increase to Lantus to 35 Units QD given FSBS was 230 mg/dL. Could also consider Novolog 3 Units [facility] given post prandials are >180mg /dL. Will check back in with patient to see if he has any questions. Will watch.  MD- If you decide to send pt home on insulin, patient would prefer insulin pens: Lantus pen- Order # [id number] Novolog pen- Order # [id number] Insulin Pen needles- Order # [phone]  Thanks, Bronson Curb, MSN, RNC-OB Diabetes Coordinator [phone] (8a-5p)

## 2017-08-01 NOTE — Evaluation (Signed)
Physical Therapy Evaluation and Discharge Patient Details Name: David Ford MRN: [id number] DOB: [birth year] Today's Date: 08/01/2017   History of Present Illness  Pt is a 77 y/o male admitted secondary to dizziness and hypotension. PMH includes DM, HTN, a fib, glaucoma, CKD III, and s/p L 1st and 2nd toe amputation.   Clinical Impression  Patient evaluated by Physical Therapy with no further acute PT needs identified. All education has been completed and the patient has no further questions. Pt at supervision to independent level with mobility. Able to perform dynamic standing and gait tasks without LOB. Pt reports he is at his baseline level of function and was asymptomatic throughout. See below for any follow-up Physical Therapy or equipment needs. PT is signing off. Thank you for this referral.     Follow Up Recommendations No PT follow up    Equipment Recommendations  None recommended by PT    Recommendations for Other Services       Precautions / Restrictions Precautions Precautions: None Restrictions Weight Bearing Restrictions: No      Mobility  Bed Mobility Overal bed mobility: Independent                Transfers Overall transfer level: Independent               General transfer comment: Able to stand at sink without UE to wash off and brush teeth. No LOB noted.   Ambulation/Gait Ambulation/Gait assistance: Supervision Ambulation Distance (Feet): 400 Feet Assistive device: None Gait Pattern/deviations: WFL(Within Functional Limits) Gait velocity: WFL  Gait velocity interpretation: at or above normal speed for age/gender General Gait Details: Overall steady gait. Able to perform dynamic gait tasks without LOB. Demonstrated good gait speed. Reports he is at his baseline with ambulation.   Stairs            Wheelchair Mobility    Modified Rankin (Stroke Patients Only)       Balance Overall balance assessment: Needs  assistance Sitting-balance support: No upper extremity supported;Feet supported Sitting balance-Leahy Scale: Good     Standing balance support: No upper extremity supported;During functional activity Standing balance-Leahy Scale: Good Standing balance comment: Able to stand to perform bathing activities and brushing teeth.                  Standardized Balance Assessment Standardized Balance Assessment : Dynamic Gait Index   Dynamic Gait Index Level Surface: Normal Change in Gait Speed: Normal Gait with Horizontal Head Turns: Mild Impairment Gait with Vertical Head Turns: Normal Gait and Pivot Turn: Mild Impairment Step Over Obstacle: Normal Step Around Obstacles: Normal       Pertinent Vitals/Pain Pain Assessment: No/denies pain    Home Living Family/patient expects to be discharged to:: Private residence Living Arrangements: Alone Available Help at Discharge: Friend(s);Family;Available PRN/intermittently Type of Home: House Home Access: Level entry     Home Layout: Two level;Able to live on main level with bedroom/bathroom Home Equipment: None      Prior Function Level of Independence: Independent               Hand Dominance        Extremity/Trunk Assessment   Upper Extremity Assessment Upper Extremity Assessment: Overall WFL for tasks assessed    Lower Extremity Assessment Lower Extremity Assessment: Overall WFL for tasks assessed    Cervical / Trunk Assessment Cervical / Trunk Assessment: Normal  Communication   Communication: No difficulties  Cognition Arousal/Alertness: Awake/alert Behavior During Therapy: WFL for tasks  assessed/performed Overall Cognitive Status: Within Functional Limits for tasks assessed                                        General Comments General comments (skin integrity, edema, etc.): Pt reports he is at his baseline and was asymptomatic throughout gait. Reports he does not feel like he  needs any follow up PT.     Exercises     Assessment/Plan    PT Assessment Patent does not need any further PT services  PT Problem List         PT Treatment Interventions      PT Goals (Current goals can be found in the Care Plan section)  Acute Rehab PT Goals Patient Stated Goal: to go home today  PT Goal Formulation: With patient Time For Goal Achievement: 08/01/17 Potential to Achieve Goals: Good    Frequency     Barriers to discharge        Co-evaluation               AM-PAC PT "6 Clicks" Daily Activity  Outcome Measure Difficulty turning over in bed (including adjusting bedclothes, sheets and blankets)?: None Difficulty moving from lying on back to sitting on the side of the bed? : None Difficulty sitting down on and standing up from a chair with arms (e.g., wheelchair, bedside commode, etc,.)?: None Help needed moving to and from a bed to chair (including a wheelchair)?: None Help needed walking in hospital room?: None Help needed climbing 3-5 steps with a railing? : A Little 6 Click Score: 23    End of Session Equipment Utilized During Treatment: Gait belt Activity Tolerance: Patient tolerated treatment well Patient left: in bed;with call bell/phone within reach Nurse Communication: Mobility status PT Visit Diagnosis: Dizziness and giddiness (R42);Other abnormalities of gait and mobility (R26.89)    Time: 6767-2094 PT Time Calculation (min) (ACUTE ONLY): 18 min   Charges:   PT Evaluation $PT Eval Low Complexity: 1 Low     PT G Codes:        Leighton Ruff, PT, DPT  Acute Rehabilitation Services  Pager: [phone]   Rudean Hitt 08/01/2017, 11:19 AM

## 2017-08-01 NOTE — Progress Notes (Signed)
Inpatient Diabetes Program Recommendations  AACE/ADA: New Consensus Statement on Inpatient Glycemic Control (2015)  Target Ranges:  Prepandial:   less than 140 mg/dL      Peak postprandial:   less than 180 mg/dL (1-2 hours)      Critically ill patients:  140 - 180 mg/dL   Lab Results  Component Value Date   GLUCAP 209 (H) 08/01/2017   HGBA1C 15.7 (H) 07/31/2017    Review of Glycemic Control Results for David Ford, David Ford (MRN [id number]) as of 08/01/2017 14:27  Ref. Range 07/31/2017 15:58 07/31/2017 21:08 08/01/2017 04:53 08/01/2017 07:56 08/01/2017 12:10  Glucose-Capillary Latest Ref Range: 65 - 99 mg/dL 314 (H) 298 (H)  230 (H) 209 (H)   History:DM, CKD Home DM Meds:Amaryl 4 mg BID Current Insulin Orders:Lantus 35 units daily Novolog Moderate Correction Scale/ SSI (0-15 units) TID AC + HS  Spoke with patient, prior to discharge, just to check back in and verify patient feels comfortable with insulin pens, administration, and being able to self administer. Patient has been self administering since yesterday and feels comfortable. Reviewed patient's current A1c of 15.7% again. Explained what a A1c is and what it measures. Also reviewed goal A1c with patient, importance of good glucose control @ home, and blood sugar goals. Really encouraged continued compliance.  Patient has a meter and test strips. No further questions at this time.   Thanks, Bronson Curb, MSN, RNC-OB Diabetes Coordinator [phone] (8a-5p)

## 2017-08-01 NOTE — Care Management Note (Signed)
Case Management Note  Patient Details  Name: David Ford MRN: [id number] Date of Birth: Mar 24, 1941  Subjective/Objective:      Admitted with DKA. From home alone.           Janos Shampine (Son) Naftoli Penny    [phone]     PCP: Dimas Chyle  Action/Plan: Transition to home today with home health services to follow. Pt states friend will provide provide transportation to home.  Expected Discharge Date:  08/01/17               Expected Discharge Plan:  Home/Self Care  In-House Referral:     Discharge planning Services  CM Consult  Post Acute Care Choice:    Choice offered to:  Patient, [facility] POA / Guardian  DME Arranged:    DME Agency:     HH Arranged:   RN [place] Agency:  [facility]  Status of Service:  Completed, signed off  If discussed at [place] of Stay Meetings, dates discussed:    Additional Comments:  Sharin Mons, RN 08/01/2017, 11:38 AM

## 2017-08-02 ENCOUNTER — Telehealth: Payer: Self-pay | Admitting: Family Medicine

## 2017-08-02 ENCOUNTER — Telehealth: Payer: Self-pay | Admitting: *Deleted

## 2017-08-02 DIAGNOSIS — E1122 Type 2 diabetes mellitus with diabetic chronic kidney disease: Secondary | ICD-10-CM | POA: Diagnosis not present

## 2017-08-02 DIAGNOSIS — E1165 Type 2 diabetes mellitus with hyperglycemia: Secondary | ICD-10-CM | POA: Diagnosis not present

## 2017-08-02 DIAGNOSIS — Z794 Long term (current) use of insulin: Secondary | ICD-10-CM | POA: Diagnosis not present

## 2017-08-02 DIAGNOSIS — N183 Chronic kidney disease, stage 3 (moderate): Secondary | ICD-10-CM | POA: Diagnosis not present

## 2017-08-02 DIAGNOSIS — H409 Unspecified glaucoma: Secondary | ICD-10-CM | POA: Diagnosis not present

## 2017-08-02 DIAGNOSIS — I152 Hypertension secondary to endocrine disorders: Secondary | ICD-10-CM | POA: Diagnosis not present

## 2017-08-02 DIAGNOSIS — E1159 Type 2 diabetes mellitus with other circulatory complications: Secondary | ICD-10-CM | POA: Diagnosis not present

## 2017-08-02 DIAGNOSIS — E785 Hyperlipidemia, unspecified: Secondary | ICD-10-CM | POA: Diagnosis not present

## 2017-08-02 DIAGNOSIS — I4891 Unspecified atrial fibrillation: Secondary | ICD-10-CM | POA: Diagnosis not present

## 2017-08-02 LAB — CULTURE, BLOOD (ROUTINE X 2): Special Requests: ADEQUATE

## 2017-08-02 NOTE — Telephone Encounter (Signed)
Attempted to call patient to complete TCM. Unable to leave voicemail. Patient has follow up visit scheduled for 08/03/17 at 10:00

## 2017-08-02 NOTE — Telephone Encounter (Signed)
Copied from [place]. Topic: Quick Communication - See Telephone Encounter >> Aug 02, 2017  9:21 AM Ether Griffins B wrote: CRM for notification. See Telephone encounter for:  Verdis Frederickson with [facility] needing verbal order for nursing to come in for diabetic care. 2 x 1 week 1 x 8 weeks. Call back number [phone]. Pt also picked up both insulin pens yesterday from pharmacy but they didn't give him the needles to attach to the ends. Please send to [id number] - [place], [place] 08/02/17.

## 2017-08-03 ENCOUNTER — Telehealth: Payer: Self-pay | Admitting: Family Medicine

## 2017-08-03 ENCOUNTER — Other Ambulatory Visit: Payer: Self-pay

## 2017-08-03 ENCOUNTER — Ambulatory Visit: Payer: Medicare PPO | Admitting: Family Medicine

## 2017-08-03 ENCOUNTER — Encounter: Payer: Self-pay | Admitting: Family Medicine

## 2017-08-03 DIAGNOSIS — E1159 Type 2 diabetes mellitus with other circulatory complications: Secondary | ICD-10-CM

## 2017-08-03 DIAGNOSIS — E1129 Type 2 diabetes mellitus with other diabetic kidney complication: Secondary | ICD-10-CM

## 2017-08-03 DIAGNOSIS — N183 Chronic kidney disease, stage 3 unspecified: Secondary | ICD-10-CM | POA: Insufficient documentation

## 2017-08-03 DIAGNOSIS — E1165 Type 2 diabetes mellitus with hyperglycemia: Secondary | ICD-10-CM

## 2017-08-03 DIAGNOSIS — I10 Essential (primary) hypertension: Secondary | ICD-10-CM

## 2017-08-03 DIAGNOSIS — IMO0002 Reserved for concepts with insufficient information to code with codable children: Secondary | ICD-10-CM

## 2017-08-03 MED ORDER — PEN NEEDLES 32G X 5 MM MISC: 1.0000 "application " | Freq: Four times a day (QID) | 11 refills | Status: AC | PRN

## 2017-08-03 MED ORDER — INSULIN GLARGINE 100 UNIT/ML SOLOSTAR PEN: 35.0000 [IU] | PEN_INJECTOR | Freq: Every morning | SUBCUTANEOUS | 2 refills | Status: DC

## 2017-08-03 NOTE — Patient Instructions (Addendum)
Please start your insulin and stop the amaryl.   Please ask your insurance company about these alternatives to lantus: 1. Basaglar 2. Detemir  Please ask your insurance company about using Humalog as an alternative to NovoLog.  We will not make any other changes today.  Please come back to see me in 1 month, or sooner as needed.  Take care, Dr. Jerline Pain

## 2017-08-03 NOTE — Consult Note (Addendum)
            [facility] CM Primary Care Navigator  08/03/2017  Rohin Krejci 05/01/1941 [id number]   Attempt to seepatient at the bedsideto identify possible discharge needs but he was alreadydischargedper staff.   Per chart review, patient was discharged home with home health services(Home health RN for continued insulin teaching due to uncontrolled DM II).  Primary care provider's officeis listed as providing transition of care (TOC).  Patient has discharge instruction to follow-up with primary care provider in 1 week (to monitor his CBGs, check compliance, newly restarted on insulin).    Addendum: (08/04/17):  Primary care provider's office called (spoke with [place] for [place]) to notify of patient's health issues needing follow-up- mainly DM.  Made aware to refer patient to [facility] CM if deemed necessary and appropriate for services.   For additional questions please contact:  Edwena Felty A. Yarely Bebee, BSN, RN-BC [facility] PRIMARY CARE Navigator Cell: [phone]

## 2017-08-03 NOTE — Assessment & Plan Note (Signed)
Renal function was back at baseline at the time of discharge.  We will check BMET with next blood draw.  May require nephrology referral if has issues with electrolyte derangements-he has been on dialysis in the past.

## 2017-08-03 NOTE — Telephone Encounter (Signed)
See note

## 2017-08-03 NOTE — Assessment & Plan Note (Addendum)
At goal on amlodipine and Coreg.  Doing well off lisinopril-will avoid ACE inhibitor for the time being.

## 2017-08-03 NOTE — Telephone Encounter (Signed)
Spoke with Verdis Frederickson and gave verbal orders.

## 2017-08-03 NOTE — Assessment & Plan Note (Signed)
A1c is very poorly controlled.  He did not have a prescription for pen needles placed at discharge and has not been on any insulin for the past 24 hours.  He does not have any current signs of hypoglycemia and his last CBG was 220.  We will send in a prescription for his pen needles.  We will additionally stop Amaryl due to possible side effects of hypoglycemia and additionally doubt that he has much beta cell function left in his pancreas at this point.  He will follow-up with me in 3-4 weeks.  Discussed warning signs and reasons to return to care.  He will bring a log of CBGs to his next visit.

## 2017-08-03 NOTE — Telephone Encounter (Signed)
Copied from [place] [id number]. Topic: Inquiry >> Aug 03, 2017 11:32 AM Pricilla Handler wrote: Reason for CRM: Siva from [facility] called stating that the patient is there currently wanting for his Insulin Glargine (LANTUS SOLOSTAR) 100 UNIT/ML Solostar Pen to be filled, but the prescription was not sent over. Please call Siva at [facility] [place] [facility] [place] [facility] [place] [phone] (Phone) [phone] (Fax).       Thank You!!!

## 2017-08-03 NOTE — Telephone Encounter (Signed)
Patient calling at pharmacy waiting, states this is urgent. Please advise

## 2017-08-03 NOTE — Progress Notes (Signed)
Subjective:  David Ford is a 77 y.o. male who presents today for a TCM visit.  HPI:  Summary of Hospital admission: Reason for admission: Type 2 diabetes, hyperglycemia, DKA Date of admission: 07/30/2017 Date of discharge: 08/01/2017 Date of Interactive contact: 08/02/2017 Summary of Hospital course: Patient presented to the ED on 07/30/2017 with generalized weakness.  In the emergency department, he was found to have a sugar greater than 800.  He was started on a DKA protocol and placed on an insulin drip.  His anion gap closed and patient was transferred to the floor on subcutaneous insulin.  Patient was initially treated empirically for community-acquired pneumonia, however this treatment was stopped on hospital day 1 due to resolution of lung findings.  No clear etiology was found for patient's DKA.  Patient was discharged on a regimen of NovoLog 3 times daily with meals as well as basal insulin.  Patient's lisinopril was stopped during his hospitalization due to acute kidney injury on CKD.  Interim History: Patient has been home for about a day.  States that his insulin pen needles were never prescribed and he has not been taking any insulin over the past day.  Type 2 diabetes, established problem, worsening Please see above hospital summary.  Symptoms have significantly worsened since our last visit.  As noted above, patient's pen needles were not prescribed he has not been on any insulin for the past 24 hours.  He checks his blood sugars morning and it was in the 220s.  He has been on insulin in the past and is familiar with self administration.  No polyuria or polydipsia.  No weakness.  No headache.  No vision changes.  Hypertension, established problem, stable As noted above, patient's lisinopril was stopped during his hospitalization due to his acute kidney injury.  He has been taking his Norvasc and Coreg without obvious adverse reactions.  ROS: Per HPI, otherwise a 10 point  review of systems was performed and was negative  PMH:  The following were reviewed and entered/updated in epic: Past Medical History:  Diagnosis Date  . AKI (acute kidney injury) ([place]) 06/18/2015  . Cancer (HCC)    hx of prostate cancer  . Chronic kidney disease   . Diabetes mellitus without complication ([place])   . DKA (diabetic ketoacidoses) ([place]) 06/18/2015  . Glaucoma   . Hyperlipidemia   . Hypertension    Patient Active Problem List   Diagnosis Date Noted  . CKD (chronic kidney disease), stage III ([place]) 08/03/2017  . Acute kidney injury ([place]) 07/30/2017  . Hyperlipidemia associated with type 2 diabetes mellitus ([place]) 06/02/2017  . BPH (benign prostatic hyperplasia) 06/02/2017  . History of prostate cancer 06/02/2017  . Insomnia 06/02/2017  . Atrial fibrillation ([place]) 06/21/2015  . Hypertension associated with diabetes ([place]) 06/17/2015  . DM type 2, uncontrolled, with renal complications ([place]) 13/24/4010   Past Surgical History:  Procedure Laterality Date  . AMPUTATION Left 07/03/2015   Procedure: Left Foot 1st and 2nd Ray Amputation;  Surgeon: Newt Minion, MD;  Location: [facility];  Service: Orthopedics;  Laterality: Left;  . BASCILIC VEIN TRANSPOSITION Left 07/14/2015   Procedure: BRACHIOBASILIC VEIN TRANSPOSITION  ;  Surgeon: Elam Dutch, MD;  Location: [facility] OR;  Service: Vascular;  Laterality: Left;  . prostate seeds      Family History  Problem Relation Age of Onset  . Diabetes Mother   . Hypertension Mother   . Stroke Mother   . Diabetes Sister   .  Diabetes Brother     Medications- reviewed and updated Current Outpatient Medications  Medication Sig Dispense Refill  . amLODipine (NORVASC) 10 MG tablet Take 10 mg by mouth 2 (two) times daily.     Marland Kitchen atorvastatin (LIPITOR) 10 MG tablet Take 10 mg by mouth daily.    . carvedilol (COREG) 3.125 MG tablet Take 1 tablet (3.125 mg total) by mouth 2 (two) times daily with a meal. 180 tablet 3  . insulin aspart  (NOVOLOG FLEXPEN) 100 UNIT/ML FlexPen Inject 6 Units into the skin 3 (three) times daily with meals. 15 mL 1  . tamsulosin (FLOMAX) 0.4 MG CAPS capsule Take 1 capsule (0.4 mg total) by mouth daily. 90 capsule 3  . trazodone (DESYREL) 300 MG tablet Take 1 tablet (300 mg total) by mouth at bedtime. 30 tablet 0  . Vitamin D, Cholecalciferol, 1000 units CAPS Take 1,000 Units by mouth daily.    . Insulin Glargine (LANTUS SOLOSTAR) 100 UNIT/ML Solostar Pen Inject 35 Units into the skin every morning. 15 mL 2  . Insulin Pen Needle (PEN NEEDLES) 32G X 5 MM MISC 1 application by Does not apply route 4 (four) times daily as needed (to inject insulin). 100 each 11   No current facility-administered medications for this visit.     Allergies-reviewed and updated No Known Allergies  Social History   Socioeconomic History  . Marital status: Divorced    Spouse name: None  . Number of children: None  . Years of education: None  . Highest education level: None  Social Needs  . Financial resource strain: None  . Food insecurity - worry: None  . Food insecurity - inability: None  . Transportation needs - medical: None  . Transportation needs - non-medical: None  Occupational History  . None  Tobacco Use  . Smoking status: Never Smoker  . Smokeless tobacco: Never Used  Substance and Sexual Activity  . Alcohol use: Yes    Comment: once a week  . Drug use: No  . Sexual activity: None  Other Topics Concern  . None  Social History Narrative  . None    Objective:  Physical Exam: BP 130/80 (BP Location: Left Arm, Patient Position: Sitting, Cuff Size: Large)   Pulse (!) 55   Temp 97.8 F (36.6 C) (Oral)   Ht 6\' 1"  (1.854 m)   Wt 201 lb 6.4 oz (91.4 kg)   SpO2 98%   BMI 26.57 kg/m   Gen: NAD, resting comfortably CV: RRR with no murmurs appreciated Pulm: NWOB, CTAB with no crackles, wheezes, or rhonchi GI: Normal bowel sounds present. Soft, Nontender, Nondistended. MSK: No edema,  cyanosis, or clubbing noted Skin: Warm, dry Neuro: Grossly normal, moves all extremities Psych: Normal affect and thought content  Summary/Review of work up during hospitalization: CBC 08/01/2017: WBC 9.4, hemoglobin 10.1, hematocrit 31.1, platelets 200 Hemoglobin A1c 07/31/2017: 15.7 BMET 08/01/2017: Sodium 131, potassium 4.5, chloride 103, bicarb 17, glucose 226, BUN 40, creatinine 2.07, calcium 8.1  Chest x-ray 07/30/2017: Negative  Assessment/Plan:  DM type 2, uncontrolled, with renal complications (HCC) E7N is very poorly controlled.  He did not have a prescription for pen needles placed at discharge and has not been on any insulin for the past 24 hours.  He does not have any current signs of hypoglycemia and his last CBG was 220.  We will send in a prescription for his pen needles.  We will additionally stop Amaryl due to possible side effects of hypoglycemia and additionally  doubt that he has much beta cell function left in his pancreas at this point.  He will follow-up with me in 3-4 weeks.  Discussed warning signs and reasons to return to care.  He will bring a log of CBGs to his next visit.  Hypertension associated with diabetes ([place]) At goal on amlodipine and Coreg.  Doing well off lisinopril-will avoid ACE inhibitor for the time being.  CKD (chronic kidney disease), stage III ([place]) Renal function was back at baseline at the time of discharge.  We will check BMET with next blood draw.  May require nephrology referral if has issues with electrolyte derangements-he has been on dialysis in the past.  Patient has a high level of medical complexity due to number of diagnoses/treatment options and amount/complexity of data reviewed.   [name]. Jerline Pain, MD 08/03/2017 12:46 PM

## 2017-08-03 NOTE — Telephone Encounter (Signed)
Rx has been electronically sent.  Patient had these insulin pens with him today at his visit.  He was complaining that needles were not sent in to go with the pens.  I have sent a new Rx for the pens.  Also, needles have been sent in.  Patient has changed his pharmacy twice since being seen this morning.

## 2017-08-03 NOTE — Telephone Encounter (Signed)
[facility] checking status, requesting call back [phone]

## 2017-08-04 DIAGNOSIS — E1165 Type 2 diabetes mellitus with hyperglycemia: Secondary | ICD-10-CM | POA: Diagnosis not present

## 2017-08-04 DIAGNOSIS — H409 Unspecified glaucoma: Secondary | ICD-10-CM | POA: Diagnosis not present

## 2017-08-04 DIAGNOSIS — N183 Chronic kidney disease, stage 3 (moderate): Secondary | ICD-10-CM | POA: Diagnosis not present

## 2017-08-04 DIAGNOSIS — Z794 Long term (current) use of insulin: Secondary | ICD-10-CM | POA: Diagnosis not present

## 2017-08-04 DIAGNOSIS — I152 Hypertension secondary to endocrine disorders: Secondary | ICD-10-CM | POA: Diagnosis not present

## 2017-08-04 DIAGNOSIS — I4891 Unspecified atrial fibrillation: Secondary | ICD-10-CM | POA: Diagnosis not present

## 2017-08-04 DIAGNOSIS — E1159 Type 2 diabetes mellitus with other circulatory complications: Secondary | ICD-10-CM | POA: Diagnosis not present

## 2017-08-04 DIAGNOSIS — E1122 Type 2 diabetes mellitus with diabetic chronic kidney disease: Secondary | ICD-10-CM | POA: Diagnosis not present

## 2017-08-04 DIAGNOSIS — E785 Hyperlipidemia, unspecified: Secondary | ICD-10-CM | POA: Diagnosis not present

## 2017-08-04 LAB — CULTURE, BLOOD (ROUTINE X 2)
CULTURE: NO GROWTH
SPECIAL REQUESTS: ADEQUATE

## 2017-08-07 ENCOUNTER — Other Ambulatory Visit: Payer: Self-pay | Admitting: Family Medicine

## 2017-08-07 DIAGNOSIS — E1129 Type 2 diabetes mellitus with other diabetic kidney complication: Secondary | ICD-10-CM

## 2017-08-07 DIAGNOSIS — IMO0002 Reserved for concepts with insufficient information to code with codable children: Secondary | ICD-10-CM

## 2017-08-07 DIAGNOSIS — E1165 Type 2 diabetes mellitus with hyperglycemia: Principal | ICD-10-CM

## 2017-08-10 DIAGNOSIS — H409 Unspecified glaucoma: Secondary | ICD-10-CM | POA: Diagnosis not present

## 2017-08-10 DIAGNOSIS — E785 Hyperlipidemia, unspecified: Secondary | ICD-10-CM | POA: Diagnosis not present

## 2017-08-10 DIAGNOSIS — E1159 Type 2 diabetes mellitus with other circulatory complications: Secondary | ICD-10-CM | POA: Diagnosis not present

## 2017-08-10 DIAGNOSIS — E1122 Type 2 diabetes mellitus with diabetic chronic kidney disease: Secondary | ICD-10-CM | POA: Diagnosis not present

## 2017-08-10 DIAGNOSIS — Z794 Long term (current) use of insulin: Secondary | ICD-10-CM | POA: Diagnosis not present

## 2017-08-10 DIAGNOSIS — N183 Chronic kidney disease, stage 3 (moderate): Secondary | ICD-10-CM | POA: Diagnosis not present

## 2017-08-10 DIAGNOSIS — I4891 Unspecified atrial fibrillation: Secondary | ICD-10-CM | POA: Diagnosis not present

## 2017-08-10 DIAGNOSIS — I152 Hypertension secondary to endocrine disorders: Secondary | ICD-10-CM | POA: Diagnosis not present

## 2017-08-10 DIAGNOSIS — E1165 Type 2 diabetes mellitus with hyperglycemia: Secondary | ICD-10-CM | POA: Diagnosis not present

## 2017-08-11 ENCOUNTER — Other Ambulatory Visit: Payer: Self-pay

## 2017-08-11 ENCOUNTER — Telehealth: Payer: Self-pay | Admitting: Family Medicine

## 2017-08-11 MED ORDER — ATORVASTATIN CALCIUM 10 MG PO TABS: 10.0000 mg | ORAL_TABLET | Freq: Every day | ORAL | 1 refills | Status: DC

## 2017-08-11 MED ORDER — AMLODIPINE BESYLATE 10 MG PO TABS: 10.0000 mg | ORAL_TABLET | Freq: Two times a day (BID) | ORAL | 1 refills | Status: DC

## 2017-08-11 NOTE — Telephone Encounter (Signed)
LOV: 08/03/17  Dr. Jerline Pain; medications not listed as previously being filled by Dr. Jerline Pain  Pharmacy: [facility]

## 2017-08-11 NOTE — Telephone Encounter (Signed)
Please advise 

## 2017-08-11 NOTE — Telephone Encounter (Signed)
Copied from [place] [id number]. Topic: Inquiry >> Aug 11, 2017 10:35 AM Pricilla Handler wrote: Reason for CRM: Patient called wanting refills of AmLODipine (NORVASC) 10 MG tablet and Atorvastatin (LIPITOR) 10 MG tablet both to be in the pen form. Patient has contacted the pharmarcy. Patient's [place], [place] [phone] (Phone) [phone] (Fax).       Thank You!!!

## 2017-08-11 NOTE — Telephone Encounter (Signed)
Refills sent to pharmacy. 

## 2017-08-14 ENCOUNTER — Telehealth: Payer: Self-pay | Admitting: Family Medicine

## 2017-08-14 MED ORDER — ATORVASTATIN CALCIUM 10 MG PO TABS: 10.0000 mg | ORAL_TABLET | Freq: Every day | ORAL | 1 refills | Status: DC

## 2017-08-14 MED ORDER — AMLODIPINE BESYLATE 10 MG PO TABS: 10.0000 mg | ORAL_TABLET | Freq: Two times a day (BID) | ORAL | 1 refills | Status: DC

## 2017-08-14 NOTE — Telephone Encounter (Signed)
Amlodipine and Atorvastatin sent to [facility] per pt's request. Medication was previously sent to [facility].

## 2017-08-14 NOTE — Telephone Encounter (Signed)
Copied from [place] [id number]. Topic: Quick Communication - See Telephone Encounter >> Aug 14, 2017  9:24 AM Conception Chancy, NT wrote: CRM for notification. See Telephone encounter for:  08/14/17.  Patient states his rx for amlodipine 10mg  and atorvastatin 10mg  were sent to [facility] and they were supposed to be sent to [facility]. Could you please switch these and notify patient as he is completely out of both. Thank you  [facility],  - [address] AT [place]

## 2017-08-17 ENCOUNTER — Other Ambulatory Visit: Payer: Self-pay | Admitting: *Deleted

## 2017-08-17 NOTE — Patient Outreach (Signed)
[facility]) Care Management  08/17/2017  David Ford [birth year] [id number]  Referral from MD office;  Reason: Diabetes Type 2 -uncontrolled with renal complications, chronic kidney disease, hospital admissions  Per chart review: Last A1c noted 15.7 on 07/31/2017 Last admission 2/10-2/05/2018-uncontrolled DM with renal complications "TOC" handled by Primary care office".  Telephone call to patient; no answer; unable to leave message.  Plan: Will follow up.  Sherrin Daisy, RN BSN [place] Management Coordinator [facility] Care Management  [phone]

## 2017-08-18 ENCOUNTER — Other Ambulatory Visit: Payer: Self-pay | Admitting: *Deleted

## 2017-08-18 ENCOUNTER — Ambulatory Visit: Payer: Self-pay | Admitting: *Deleted

## 2017-08-18 NOTE — Telephone Encounter (Signed)
RN  'Kathlee Nations' from [place] called to report pt's CBGs ranging from 332-398 past week. Pt present during call; asymptomatic. Hospital admission 2/10-2/14 with ketoacidosis. LOV with Dr. Jerline Pain 08/03/17.  RN reviewing medications/insulin during call and noted pt has not been properly administrating correct Insulin dosages.  Ordered Novolog 6 units 3x day and Lantus 35 units QD.  Pt has been taking 5 units of each insulin only once a day. RN will educate pt re: dosages. monitor and call back as needed. Pt has F/U appt 09/04/17. Note routed to practice.   Reason for Disposition . [1] Blood glucose > 240 mg/dl (13 mmol/l) AND [2] uses insulin (e.g., insulin-dependent, all people with type 1 diabetes)  Protocols used: DIABETES - HIGH BLOOD SUGAR-A-AH

## 2017-08-18 NOTE — Patient Outreach (Signed)
[facility]) Care Management  08/18/2017  David Ford 17-Dec-1940 [id number]  Referral from MD office: Reason: Diabetes Type 2 -uncontrolled with renal complications, chronic kidney disease, hospital admissions  Per chart review: Last A1c noted 15.7 on 07/31/2017 Last admission 2/10-2/05/2018-uncontrolled DM with renal complications "TOC" handled by Primary care office".  Telephone call- patient who was advised of reason for call & [facility] care management services. Patient voices that  was recently hospitalized after he passed out in [place]. States blood sugar was "high". States he was on insulin & discharged to home with home health services. States he administers own insulin & checks blood sugar once daily. Voices he lives alone and is independent in his care and drives.   Voices he had hospital follow up appointment 2/13 with primary care provider and has follow up appointment 08/07/2017.   Patient voices main concern is his "blood sugar. States he is not aware of current A1c -thinks maybe around 8 (it is noted in medical record with value 15.7 on 07/31/2017).  States he has had diabetes over 15 years. Voices has not had diabetes classes when questioned. States he has not been taking insulin correctly but that was corrected today when home health nurse talked with his doctor's office today.  Patient was offered [facility] community care coordinator services.  Patient consents to services.   Plan: Send to care management assistant to [facility] care coordinator for complex case management of patient with uncontrolled diabetes mellitus with A1c 15.7.   Sherrin Daisy, RN BSN [place] Management Coordinator [facility] Care Management  [phone]

## 2017-08-18 NOTE — Telephone Encounter (Signed)
See note

## 2017-08-18 NOTE — Telephone Encounter (Signed)
Noted.  Will await call from RN for update.

## 2017-08-21 ENCOUNTER — Other Ambulatory Visit: Payer: Self-pay

## 2017-08-21 NOTE — Patient Outreach (Signed)
[facility]) Care Management  08/21/2017  David Ford Jan 10, 1941 [id number]   Referral received per telephonic care coordinator. Original referral per MD.  Assessment: 77 year old with recent admission 2/10-2/12 with diabetes. History of diabetes type 2, hypertension, atrial fibrillation, CKD, prostate cancer.   Care Coordination: RNCM called to schedule home visit. Client is agreeable to home visit.  Plan: Home visit scheduled for 3/13.  Thea Silversmith, RN, MSN, [place] Coordinator Cell: [phone]

## 2017-08-29 DIAGNOSIS — H409 Unspecified glaucoma: Secondary | ICD-10-CM | POA: Diagnosis not present

## 2017-08-29 DIAGNOSIS — E1159 Type 2 diabetes mellitus with other circulatory complications: Secondary | ICD-10-CM | POA: Diagnosis not present

## 2017-08-29 DIAGNOSIS — E1122 Type 2 diabetes mellitus with diabetic chronic kidney disease: Secondary | ICD-10-CM | POA: Diagnosis not present

## 2017-08-29 DIAGNOSIS — E1165 Type 2 diabetes mellitus with hyperglycemia: Secondary | ICD-10-CM | POA: Diagnosis not present

## 2017-08-29 DIAGNOSIS — N183 Chronic kidney disease, stage 3 (moderate): Secondary | ICD-10-CM | POA: Diagnosis not present

## 2017-08-29 DIAGNOSIS — I4891 Unspecified atrial fibrillation: Secondary | ICD-10-CM | POA: Diagnosis not present

## 2017-08-29 DIAGNOSIS — E785 Hyperlipidemia, unspecified: Secondary | ICD-10-CM | POA: Diagnosis not present

## 2017-08-29 DIAGNOSIS — I152 Hypertension secondary to endocrine disorders: Secondary | ICD-10-CM | POA: Diagnosis not present

## 2017-08-29 DIAGNOSIS — Z794 Long term (current) use of insulin: Secondary | ICD-10-CM | POA: Diagnosis not present

## 2017-08-30 ENCOUNTER — Other Ambulatory Visit: Payer: Self-pay

## 2017-08-30 NOTE — Patient Outreach (Signed)
[facility]) Care Management  08/30/2017  Laden Fieldhouse Sep 15, 1940 [id number]   Subjective: client denies any concerns or problems at this time.   Objective: skin warm dry, color within normal limits.  Assessment: 77 year old with recent admission 2/10-2/12 with diabetes. History of diabetes type 2, hypertension, atrial fibrillation, CKD, prostate cancer.   RNCM unable to complete home visit due to client's car ride came to pick him up within 10 minutes of RNCM's arrival. [facility] reviewed [place] Management welcome packet.   Home visit rescheduled for tomorrow.   Thea Silversmith, RN, MSN, [place] Coordinator Cell: [phone]

## 2017-08-31 ENCOUNTER — Other Ambulatory Visit: Payer: Self-pay

## 2017-08-31 ENCOUNTER — Ambulatory Visit: Payer: Medicare HMO | Admitting: Family Medicine

## 2017-08-31 NOTE — Patient Outreach (Signed)
[facility]) Care Management   08/31/2017  Jaire Pinkham 08/04/1940 [id number]  Tyrie Porzio is an 77 y.o. male   Subjective: client reports his blood sugar levels are much better now.  Objective:  BP (!) 150/68   Pulse (!) 59   Resp 20   Ht 1.854 m (6\' 1" )   Wt 200 lb (90.7 kg) Comment: patient reported.  SpO2 96%   BMI 26.39 kg/m   Review of Systems  Respiratory:       Lungs clear  Cardiovascular:       S1S2 noted, regular    Physical Exam skin warm dry within normal color.  Encounter Medications:   Outpatient Encounter Medications as of 08/31/2017  Medication Sig  . amLODipine (NORVASC) 10 MG tablet Take 1 tablet (10 mg total) by mouth 2 (two) times daily.  Marland Kitchen atorvastatin (LIPITOR) 10 MG tablet Take 1 tablet (10 mg total) by mouth daily.  . carvedilol (COREG) 3.125 MG tablet Take 1 tablet (3.125 mg total) by mouth 2 (two) times daily with a meal.  . glimepiride (AMARYL) 4 MG tablet Take 4 mg by mouth 2 (two) times daily.  . insulin aspart (NOVOLOG FLEXPEN) 100 UNIT/ML FlexPen Inject 6 Units into the skin 3 (three) times daily with meals.  . tamsulosin (FLOMAX) 0.4 MG CAPS capsule Take 1 capsule (0.4 mg total) by mouth daily.  . trazodone (DESYREL) 300 MG tablet TAKE 1 TABLET BY MOUTH AT BEDTIME  . Vitamin D, Cholecalciferol, 1000 units CAPS Take 1,000 Units by mouth daily.  . Insulin Glargine (LANTUS SOLOSTAR) 100 UNIT/ML Solostar Pen Inject 35 Units into the skin every morning.  . Insulin Pen Needle (PEN NEEDLES) 32G X 5 MM MISC 1 application by Does not apply route 4 (four) times daily as needed (to inject insulin).   No facility-administered encounter medications on file as of 08/31/2017.     Functional Status:   In your present state of health, do you have any difficulty performing the following activities: 08/31/2017 07/30/2017  Hearing? N N  Vision? N N  Difficulty concentrating or making decisions? N N  Walking or climbing stairs? N N   Dressing or bathing? N N  Doing errands, shopping? N N  Preparing Food and eating ? N -  Using the Toilet? N -  In the past six months, have you accidently leaked urine? N -  Do you have problems with loss of bowel control? N -  Managing your Medications? N -  Managing your Finances? N -  Housekeeping or managing your Housekeeping? N -  Some recent data might be hidden    Fall/Depression Screening:    Fall Risk  08/31/2017 08/18/2017  Falls in the past year? No No   PHQ 2/9 Scores 08/18/2017  PHQ - 2 Score 0    Assessment:  77 year old with history of diabetes. Client reports home health is following him for advanced home care at least 6 weeks. He is attending scheduled appointments. He acknowledges he was not taking his insulin correctly, but now has a clear understanding of how to take his insulin. Blood sugar this morning was 114. He has a follow up appointment with primary care on 3/18.  Client takes novolog before meals. Client reports he does not check his blood sugar prior to giving self insulin. RNCM reinforced importance of checking blood sugar prior to giving self novolog. RNCM encouraged client to check blood sugar prior to giving self novolog. Client is not recording  blood sugars.  Medications reviewed. No questions or issues noted. Client denies difficulty obtaining medications. Benefits of Mail order vs local phamarcy discussed.  Plan: Assign EMMI material, follow up with client next week after visit with provider.  THN CM Care Plan Problem One     Most Recent Value  Care Plan Problem One  increased A1C level 15.7 with signs/symptoms resulting in hospitalization.   Role Documenting the Problem One  Care Management [place] for Problem One  Active  [facility] Long Term Goal   client will verbalize at least three strategies for diabetes management within the next 31-45 days.  THN Long Term Goal Start Date  08/31/17  Interventions for Problem One Long Term Goal   discussed the importance of checking and recording blood sugar  THN CM Short Term Goal #1   client will review diabetes booklet and prepare questions if any within the next 30 days.  THN CM Short Term Goal #1 Start Date  08/31/17  Interventions for Short Term Goal #1  RNCM discussed A1C level and what an A1C level is, reviewed client's glucose meter, encouraged client to check blood sugars prior to giving insulin.encouraged client to ask primary care about target A1C level and how his recommendation for checking blood sugar, RNCM also discussed why it is recommended for client to check blood sugar prior to giving self Novolog insulin.  THN CM Short Term Goal #2   at next provider visit, client will discuss target Blood sugar range and providers recommendations for how often he wants client to check his blood sugar.   THN CM Short Term Goal #2 Start Date  08/31/17  Interventions for Short Term Goal #2  reviewed upcoming appointments, discussed transportation if any needs.     Thea Silversmith, RN, MSN, [place] Coordinator Cell: [phone]

## 2017-09-04 ENCOUNTER — Encounter: Payer: Self-pay | Admitting: Family Medicine

## 2017-09-04 ENCOUNTER — Other Ambulatory Visit: Payer: Self-pay

## 2017-09-04 ENCOUNTER — Ambulatory Visit: Payer: Medicare HMO | Admitting: Family Medicine

## 2017-09-04 ENCOUNTER — Telehealth: Payer: Self-pay | Admitting: Family Medicine

## 2017-09-04 VITALS — BP 132/70 | HR 66 | Temp 97.9°F | Ht 73.0 in | Wt 218.0 lb

## 2017-09-04 DIAGNOSIS — IMO0002 Reserved for concepts with insufficient information to code with codable children: Secondary | ICD-10-CM

## 2017-09-04 DIAGNOSIS — E1165 Type 2 diabetes mellitus with hyperglycemia: Secondary | ICD-10-CM

## 2017-09-04 DIAGNOSIS — E1129 Type 2 diabetes mellitus with other diabetic kidney complication: Secondary | ICD-10-CM | POA: Diagnosis not present

## 2017-09-04 DIAGNOSIS — I10 Essential (primary) hypertension: Secondary | ICD-10-CM

## 2017-09-04 DIAGNOSIS — E1159 Type 2 diabetes mellitus with other circulatory complications: Secondary | ICD-10-CM | POA: Diagnosis not present

## 2017-09-04 DIAGNOSIS — E785 Hyperlipidemia, unspecified: Secondary | ICD-10-CM | POA: Diagnosis not present

## 2017-09-04 DIAGNOSIS — E1169 Type 2 diabetes mellitus with other specified complication: Secondary | ICD-10-CM | POA: Diagnosis not present

## 2017-09-04 LAB — POCT GLYCOSYLATED HEMOGLOBIN (HGB A1C): Hemoglobin A1C: 11.5

## 2017-09-04 MED ORDER — GLUCOSE BLOOD VI STRP: ORAL_STRIP | 12 refills | Status: DC

## 2017-09-04 MED ORDER — AMLODIPINE BESYLATE 10 MG PO TABS: 10.0000 mg | ORAL_TABLET | Freq: Every day | ORAL | 3 refills | Status: DC

## 2017-09-04 MED ORDER — INSULIN DEGLUDEC 100 UNIT/ML ~~LOC~~ SOPN: 35.0000 [IU] | PEN_INJECTOR | Freq: Every day | SUBCUTANEOUS | 5 refills | Status: DC

## 2017-09-04 NOTE — Telephone Encounter (Signed)
Please advise 

## 2017-09-04 NOTE — Telephone Encounter (Signed)
Called and left message with patient to verify pharmacy information.

## 2017-09-04 NOTE — Telephone Encounter (Signed)
Patient uses [place], [address]

## 2017-09-04 NOTE — Progress Notes (Signed)
    Subjective:  David Ford is a 77 y.o. male who presents today with a chief complaint of T2DM.   HPI:  T2DM, established problem, uncontrolled Pt seen 1 month ago for hospital follow up for hyperglycemia. A1c at that time was over 15. His current regimen is lantus 35 units daily, novolog 6 units tid with meals.  He has also been taking Amaryl twice daily.  He has tolerated all these medications without side effects.  Fasting sugars usually in the low 100s.  He has had a couple in the 39s and 59s, however did not have any hypoglycemic symptoms at the time.  Highs in the 140s over 68s.  Patient reports that his insulin medications are both very expensive and he has trouble affording both of them.  Hypertension, established problem, stable BP Readings from Last 3 Encounters:  09/04/17 132/70  08/31/17 (!) 150/68  08/03/17 130/80   Home BP monitoring: Not checking.  Current Medications: Amlodipine 10 mg twice daily, Coreg 3.125 mg twice daily, compliant without side effects.  ROS: Denies any chest pain, shortness of breath, dyspnea on exertion, leg edema.   HLD, established problem, stable On Lipitor 10 mg daily.  Tolerates this well without side effects.  PMH: He reports that  has never smoked. he has never used smokeless tobacco. He reports that he drinks alcohol. He reports that he does not use drugs.  Objective:  Physical Exam: BP 132/70 (BP Location: Right Arm, Cuff Size: Normal)   Pulse 66   Temp 97.9 F (36.6 C)   Ht 6\' 1"  (1.854 m)   Wt 218 lb (98.9 kg)   BMI 28.76 kg/m   Gen: NAD, resting comfortably CV: RRR with no murmurs appreciated Pulm: NWOB, CTAB with no crackles, wheezes, or rhonchi MSK: -Lower extremities: Status post left first and second ray amputation.  No other deformities noted.  Sensation to light touch and monofilament intact.  DP and PT pulses intact.  Assessment/Plan:  DM type 2, uncontrolled, with renal complications (HCC) W3S improved to 11.5  today.  We will stop his Amaryl-this was advised at his last appointment.  Continue Lantus 35 units daily and NovoLog 6 units 3 times daily with meals.  Patient will check on other forms of insulin.  If he is unable to get coverage, we will need to switch him to NPH.  Foot exam performed today.  Follow up in 8 weeks.   Hypertension associated with diabetes ([place]) At goal.  We will reduce his dose of amlodipine to the max recommended of 10 mg daily.  Continue Coreg 3.125 mg twice daily.  Consider addition of ACE/ARB if not at goal in the future.  Hyperlipidemia associated with type 2 diabetes mellitus (HCC) Continue Lipitor 10 mg daily.  We will obtain records from previous PCP for his most recent lipid panel.  Will likely need repeat labs soon.  Preventative healthcare Patient reports that he is up-to-date on his pneumonia and flu vaccines.  We will again send for records from his previous provider.  [name]. Jerline Pain, MD 09/04/2017 9:35 AM

## 2017-09-04 NOTE — Assessment & Plan Note (Addendum)
A1c improved to 11.5 today.  We will stop his Amaryl-this was advised at his last appointment.  Continue Lantus 35 units daily and NovoLog 6 units 3 times daily with meals.  Patient will check on other forms of insulin.  If he is unable to get coverage, we will need to switch him to NPH.  Foot exam performed today.  Follow up in 8 weeks.

## 2017-09-04 NOTE — Telephone Encounter (Signed)
Rx has been sent to patient's pharmacy. 

## 2017-09-04 NOTE — Telephone Encounter (Signed)
See note

## 2017-09-04 NOTE — Assessment & Plan Note (Signed)
At goal.  We will reduce his dose of amlodipine to the max recommended of 10 mg daily.  Continue Coreg 3.125 mg twice daily.  Consider addition of ACE/ARB if not at goal in the future.

## 2017-09-04 NOTE — Telephone Encounter (Signed)
Copied from [place] [id number]. Topic: Quick Communication - See Telephone Encounter >> Sep 04, 2017 10:46 AM Robina Ade, Helene Kelp D wrote: CRM for notification. See Telephone encounter for: 09/04/17. Patient called and said that he saw Dimas Chyle today and told him to call his pharmacy and let him know what medication is covered by his insurance to switch him from Lantus. Patient said that it will cost him less if he gets [place] or Levemir. If there are any questions please call patient back, thanks.

## 2017-09-04 NOTE — Patient Instructions (Signed)
Your blood sugar looks much better today.  We will not make any changes to your insulin.  Please ask your insurance company about alternatives to the Lantus.  Possibilities would be basaglar, tresiba, or levemir.  Please ask your insurance company about alternatives to NovoLog.  Possibilities would be Humalog.  Please switch your amlodipine to once daily dosing.  No other changes today.  Come back to see me in 6-8 weeks, or sooner as needed.  Take care, Dr Jerline Pain

## 2017-09-04 NOTE — Assessment & Plan Note (Signed)
Continue Lipitor 10 mg daily.  We will obtain records from previous PCP for his most recent lipid panel.  Will likely need repeat labs soon.

## 2017-09-04 NOTE — Telephone Encounter (Signed)
Please send in tresiba pen 35 units daily, dispense 5 pens with 5 refills.

## 2017-09-05 ENCOUNTER — Other Ambulatory Visit: Payer: Self-pay | Admitting: Family Medicine

## 2017-09-05 ENCOUNTER — Other Ambulatory Visit: Payer: Self-pay

## 2017-09-06 DIAGNOSIS — E1159 Type 2 diabetes mellitus with other circulatory complications: Secondary | ICD-10-CM | POA: Diagnosis not present

## 2017-09-06 DIAGNOSIS — I4891 Unspecified atrial fibrillation: Secondary | ICD-10-CM | POA: Diagnosis not present

## 2017-09-06 DIAGNOSIS — E1165 Type 2 diabetes mellitus with hyperglycemia: Secondary | ICD-10-CM | POA: Diagnosis not present

## 2017-09-06 DIAGNOSIS — N183 Chronic kidney disease, stage 3 (moderate): Secondary | ICD-10-CM | POA: Diagnosis not present

## 2017-09-06 DIAGNOSIS — I152 Hypertension secondary to endocrine disorders: Secondary | ICD-10-CM | POA: Diagnosis not present

## 2017-09-06 DIAGNOSIS — E785 Hyperlipidemia, unspecified: Secondary | ICD-10-CM | POA: Diagnosis not present

## 2017-09-06 DIAGNOSIS — Z794 Long term (current) use of insulin: Secondary | ICD-10-CM | POA: Diagnosis not present

## 2017-09-06 DIAGNOSIS — H409 Unspecified glaucoma: Secondary | ICD-10-CM | POA: Diagnosis not present

## 2017-09-06 DIAGNOSIS — E1122 Type 2 diabetes mellitus with diabetic chronic kidney disease: Secondary | ICD-10-CM | POA: Diagnosis not present

## 2017-09-08 ENCOUNTER — Other Ambulatory Visit: Payer: Self-pay

## 2017-09-11 ENCOUNTER — Encounter: Payer: Self-pay | Admitting: Physical Therapy

## 2017-09-11 ENCOUNTER — Encounter: Payer: Medicare PPO | Admitting: Family Medicine

## 2017-09-12 ENCOUNTER — Encounter: Payer: Self-pay | Admitting: Family Medicine

## 2017-09-12 ENCOUNTER — Ambulatory Visit (INDEPENDENT_AMBULATORY_CARE_PROVIDER_SITE_OTHER): Payer: Medicare PPO | Admitting: Family Medicine

## 2017-09-12 VITALS — BP 146/78 | HR 61 | Temp 97.9°F | Ht 73.0 in | Wt 211.0 lb

## 2017-09-12 DIAGNOSIS — E1165 Type 2 diabetes mellitus with hyperglycemia: Secondary | ICD-10-CM

## 2017-09-12 DIAGNOSIS — E785 Hyperlipidemia, unspecified: Secondary | ICD-10-CM | POA: Diagnosis not present

## 2017-09-12 DIAGNOSIS — E1159 Type 2 diabetes mellitus with other circulatory complications: Secondary | ICD-10-CM | POA: Diagnosis not present

## 2017-09-12 DIAGNOSIS — I4891 Unspecified atrial fibrillation: Secondary | ICD-10-CM | POA: Diagnosis not present

## 2017-09-12 DIAGNOSIS — Z0001 Encounter for general adult medical examination with abnormal findings: Secondary | ICD-10-CM | POA: Diagnosis not present

## 2017-09-12 DIAGNOSIS — I152 Hypertension secondary to endocrine disorders: Secondary | ICD-10-CM

## 2017-09-12 DIAGNOSIS — I10 Essential (primary) hypertension: Secondary | ICD-10-CM | POA: Diagnosis not present

## 2017-09-12 DIAGNOSIS — Z23 Encounter for immunization: Secondary | ICD-10-CM

## 2017-09-12 DIAGNOSIS — E1129 Type 2 diabetes mellitus with other diabetic kidney complication: Secondary | ICD-10-CM

## 2017-09-12 DIAGNOSIS — E1122 Type 2 diabetes mellitus with diabetic chronic kidney disease: Secondary | ICD-10-CM | POA: Diagnosis not present

## 2017-09-12 DIAGNOSIS — Z794 Long term (current) use of insulin: Secondary | ICD-10-CM | POA: Diagnosis not present

## 2017-09-12 DIAGNOSIS — IMO0002 Reserved for concepts with insufficient information to code with codable children: Secondary | ICD-10-CM

## 2017-09-12 DIAGNOSIS — Z8669 Personal history of other diseases of the nervous system and sense organs: Secondary | ICD-10-CM | POA: Diagnosis not present

## 2017-09-12 DIAGNOSIS — H409 Unspecified glaucoma: Secondary | ICD-10-CM | POA: Diagnosis not present

## 2017-09-12 DIAGNOSIS — N183 Chronic kidney disease, stage 3 (moderate): Secondary | ICD-10-CM | POA: Diagnosis not present

## 2017-09-12 MED ORDER — INSULIN LISPRO 100 UNIT/ML (KWIKPEN): 6.0000 [IU] | PEN_INJECTOR | Freq: Three times a day (TID) | SUBCUTANEOUS | 11 refills | Status: DC

## 2017-09-12 NOTE — Progress Notes (Signed)
Subjective:  David Ford is a 77 y.o. male who presents today for his annual comprehensive physical exam.    HPI:  He has no acute complaints today.   His stable, chronic medical conditions are summarized below:  1. T2DM.  Currently on Lantus and NovoLog.  He will be switching to tresiba due to insurance preference.  He would also like to switch to Humalog due to financial reasons.  But sugars have been stable.  CBG of 101 this morning.  2.  Hypertension.  Currently on amlodipine and Coreg.  Tolerates both these well without side effects.  3.  Glaucoma, history of cataracts.  Requests referral to ophthalmology.  Lifestyle Diet: Trying to cut back on food intake. Trying to eat lots of vegetables.  Exercise: Walks regularly. Tries to walk about a mile and a half several times per week.   Depression screen PHQ 2/9 08/18/2017  Decreased Interest 0  Down, Depressed, Hopeless 0  PHQ - 2 Score 0    Health Maintenance Due  Topic Date Due  . TETANUS/TDAP  10/03/1960  . URINE MICROALBUMIN  08/17/2013     ROS: A complete review of systems was negative.   PMH:  The following were reviewed and entered/updated in epic: Past Medical History:  Diagnosis Date  . AKI (acute kidney injury) ([place]) 06/18/2015  . Cancer (HCC)    hx of prostate cancer  . Chronic kidney disease   . Diabetes mellitus without complication ([place])   . DKA (diabetic ketoacidoses) ([place]) 06/18/2015  . Glaucoma   . Hyperlipidemia   . Hypertension    Patient Active Problem List   Diagnosis Date Noted  . CKD (chronic kidney disease), stage III ([place]) 08/03/2017  . Hyperlipidemia associated with type 2 diabetes mellitus ([place]) 06/02/2017  . BPH (benign prostatic hyperplasia) 06/02/2017  . History of prostate cancer 06/02/2017  . Insomnia 06/02/2017  . Hypertension associated with diabetes ([place]) 06/17/2015  . DM type 2, uncontrolled, with renal complications ([place]) 09/47/0962   Past Surgical History:  Procedure  Laterality Date  . AMPUTATION Left 07/03/2015   Procedure: Left Foot 1st and 2nd Ray Amputation;  Surgeon: Newt Minion, MD;  Location: [facility];  Service: Orthopedics;  Laterality: Left;  . BASCILIC VEIN TRANSPOSITION Left 07/14/2015   Procedure: BRACHIOBASILIC VEIN TRANSPOSITION  ;  Surgeon: Elam Dutch, MD;  Location: [facility];  Service: Vascular;  Laterality: Left;  . prostate seeds      Family History  Problem Relation Age of Onset  . Diabetes Mother   . Hypertension Mother   . Stroke Mother   . Diabetes Sister   . Diabetes Brother     Medications- reviewed and updated Current Outpatient Medications  Medication Sig Dispense Refill  . amLODipine (NORVASC) 10 MG tablet Take 1 tablet (10 mg total) by mouth daily. 90 tablet 3  . atorvastatin (LIPITOR) 10 MG tablet Take 1 tablet (10 mg total) by mouth daily. 90 tablet 1  . BD PEN NEEDLE NANO U/F 32G X 4 MM MISC U UTD QID PRN  11  . carvedilol (COREG) 3.125 MG tablet Take 1 tablet (3.125 mg total) by mouth 2 (two) times daily with a meal. 180 tablet 3  . glucose blood test strip Use as instructed 100 each 12  . insulin degludec (TRESIBA FLEXTOUCH) 100 UNIT/ML SOPN FlexTouch Pen Inject 0.35 mLs (35 Units total) into the skin daily. 5 pen 5  . Insulin Glargine (LANTUS SOLOSTAR) 100 UNIT/ML Solostar Pen Inject 35  Units into the skin every morning. 15 mL 2  . Insulin Pen Needle (PEN NEEDLES) 32G X 5 MM MISC 1 application by Does not apply route 4 (four) times daily as needed (to inject insulin). 100 each 11  . tamsulosin (FLOMAX) 0.4 MG CAPS capsule Take 1 capsule (0.4 mg total) by mouth daily. 90 capsule 3  . trazodone (DESYREL) 300 MG tablet TAKE 1 TABLET BY MOUTH AT BEDTIME 90 tablet 0  . Vitamin D, Cholecalciferol, 1000 units CAPS Take 1,000 Units by mouth daily.    . insulin lispro (HUMALOG KWIKPEN) 100 UNIT/ML KiwkPen Inject 0.06 mLs (6 Units total) into the skin 3 (three) times daily. 15 mL 11   No current facility-administered  medications for this visit.     Allergies-reviewed and updated No Known Allergies  Social History   Socioeconomic History  . Marital status: Divorced    Spouse name: Not on file  . Number of children: Not on file  . Years of education: Not on file  . Highest education level: Not on file  Occupational History  . Not on file  Social Needs  . Financial resource strain: Not on file  . Food insecurity:    Worry: Not on file    Inability: Not on file  . Transportation needs:    Medical: Not on file    Non-medical: Not on file  Tobacco Use  . Smoking status: Never Smoker  . Smokeless tobacco: Never Used  Substance and Sexual Activity  . Alcohol use: Yes    Comment: once a week  . Drug use: No  . Sexual activity: Not on file  Lifestyle  . Physical activity:    Days per week: Not on file    Minutes per session: Not on file  . Stress: Not on file  Relationships  . Social connections:    Talks on phone: Not on file    Gets together: Not on file    Attends religious service: Not on file    Active member of club or organization: Not on file    Attends meetings of clubs or organizations: Not on file    Relationship status: Not on file  Other Topics Concern  . Not on file  Social History Narrative  . Not on file    Objective:  Physical Exam: BP (!) 146/78 (BP Location: Right Arm) Comment (BP Location): Right arm only/ hx of dialysis  Pulse 61   Temp 97.9 F (36.6 C)   Ht 6\' 1"  (1.854 m)   Wt 211 lb (95.7 kg)   SpO2 97%   BMI 27.84 kg/m   Body mass index is 27.84 kg/m. Wt Readings from Last 3 Encounters:  09/12/17 211 lb (95.7 kg)  09/04/17 218 lb (98.9 kg)  08/31/17 200 lb (90.7 kg)   Gen: NAD, resting comfortably HEENT: TMs normal bilaterally. OP clear. No thyromegaly noted.  CV: RRR with no murmurs appreciated Pulm: NWOB, CTAB with no crackles, wheezes, or rhonchi GI: Normal bowel sounds present. Soft, Nontender, Nondistended. MSK: no edema, cyanosis, or  clubbing noted Skin: warm, dry Neuro: CN2-12 grossly intact. Strength 5/5 in upper and lower extremities. Reflexes symmetric and intact bilaterally.  Psych: Normal affect and thought content  Assessment/Plan:  Hypertension associated with diabetes ([place]) Mildly elevated today. Previously well controlled. Follow up in 6-8 weeks. Add ACE/ARB if still above goal.   DM type 2, uncontrolled, with renal complications ([place]) Sent in humalog. Continue current dose of basal insulin. Follow up  in 6-8 weeks.   History of glaucoma and cataracts Referral to ophthalmology placed.   Preventative Healthcare: Lipid panel done 5 months ago.  Do not need to repeat today.  Pneumonia shot given.  Patient Counseling:  -Nutrition: Stressed importance of moderation in sodium/caffeine intake, saturated fat and cholesterol, caloric balance, sufficient intake of fresh fruits, vegetables, and fiber.  -Stressed the importance of regular exercise.   -Substance Abuse: Discussed cessation/primary prevention of tobacco, alcohol, or other drug use; driving or other dangerous activities under the influence; availability of treatment for abuse.   -Injury prevention: Discussed safety belts, safety helmets, smoke detector, smoking near bedding or upholstery.   -Sexuality: Discussed sexually transmitted diseases, partner selection, use of condoms, avoidance of unintended pregnancy and contraceptive alternatives.   -Dental health: Discussed importance of regular tooth brushing, flossing, and dental visits.  -Health maintenance and immunizations reviewed. Please refer to Health maintenance section.  Return to care in 1 year for next preventative visit.   [name]. Jerline Pain, MD 09/12/2017 9:59 AM

## 2017-09-12 NOTE — Assessment & Plan Note (Signed)
Sent in [place]. Continue current dose of basal insulin. Follow up in 6-8 weeks.

## 2017-09-12 NOTE — Assessment & Plan Note (Signed)
Mildly elevated today. Previously well controlled. Follow up in 6-8 weeks. Add ACE/ARB if still above goal.

## 2017-09-12 NOTE — Patient Instructions (Addendum)
I will place a referral to the ophthalmologist.  No other changes today.    Preventive Care 76 Years and Older, Male Preventive care refers to lifestyle choices and visits with your health care provider that can promote health and wellness. What does preventive care include?  A yearly physical exam. This is also called an annual well check.  Dental exams once or twice a year.  Routine eye exams. Ask your health care provider how often you should have your eyes checked.  Personal lifestyle choices, including: ? Daily care of your teeth and gums. ? Regular physical activity. ? Eating a healthy diet. ? Avoiding tobacco and drug use. ? Limiting alcohol use. ? Practicing safe sex. ? Taking low doses of aspirin every day. ? Taking vitamin and mineral supplements as recommended by your health care provider. What happens during an annual well check? The services and screenings done by your health care provider during your annual well check will depend on your age, overall health, lifestyle risk factors, and family history of disease. Counseling Your health care provider may ask you questions about your:  Alcohol use.  Tobacco use.  Drug use.  Emotional well-being.  Home and relationship well-being.  Sexual activity.  Eating habits.  History of falls.  Memory and ability to understand (cognition).  Work and work Statistician.  Screening You may have the following tests or measurements:  Height, weight, and BMI.  Blood pressure.  Lipid and cholesterol levels. These may be checked every 5 years, or more frequently if you are over 71 years old.  Skin check.  Lung cancer screening. You may have this screening every year starting at age 21 if you have a 30-pack-year history of smoking and currently smoke or have quit within the past 15 years.  Fecal occult blood test (FOBT) of the stool. You may have this test every year starting at age 20.  Flexible sigmoidoscopy or  colonoscopy. You may have a sigmoidoscopy every 5 years or a colonoscopy every 10 years starting at age 43.  Prostate cancer screening. Recommendations will vary depending on your family history and other risks.  Hepatitis C blood test.  Hepatitis B blood test.  Sexually transmitted disease (STD) testing.  Diabetes screening. This is done by checking your blood sugar (glucose) after you have not eaten for a while (fasting). You may have this done every 1-3 years.  Abdominal aortic aneurysm (AAA) screening. You may need this if you are a current or former smoker.  Osteoporosis. You may be screened starting at age 86 if you are at high risk.  Talk with your health care provider about your test results, treatment options, and if necessary, the need for more tests. Vaccines Your health care provider may recommend certain vaccines, such as:  Influenza vaccine. This is recommended every year.  Tetanus, diphtheria, and acellular pertussis (Tdap, Td) vaccine. You may need a Td booster every 10 years.  Varicella vaccine. You may need this if you have not been vaccinated.  Zoster vaccine. You may need this after age 29.  Measles, mumps, and rubella (MMR) vaccine. You may need at least one dose of MMR if you were born in 1957 or later. You may also need a second dose.  Pneumococcal 13-valent conjugate (PCV13) vaccine. One dose is recommended after age 33.  Pneumococcal polysaccharide (PPSV23) vaccine. One dose is recommended after age 89.  Meningococcal vaccine. You may need this if you have certain conditions.  Hepatitis A vaccine. You may need  this if you have certain conditions or if you travel or work in places where you may be exposed to hepatitis A.  Hepatitis B vaccine. You may need this if you have certain conditions or if you travel or work in places where you may be exposed to hepatitis B.  Haemophilus influenzae type b (Hib) vaccine. You may need this if you have certain risk  factors.  Talk to your health care provider about which screenings and vaccines you need and how often you need them. This information is not intended to replace advice given to you by your health care provider. Make sure you discuss any questions you have with your health care provider. Document Released: 07/03/2015 Document Revised: 02/24/2016 Document Reviewed: 04/07/2015 Elsevier Interactive Patient Education  2018 Elsevier Inc.  

## 2017-09-13 ENCOUNTER — Telehealth: Payer: Self-pay | Admitting: Family Medicine

## 2017-09-13 NOTE — Telephone Encounter (Signed)
Will route to office for review.; last seen by Dr Dimas Chyle 09/12/17.

## 2017-09-13 NOTE — Telephone Encounter (Signed)
Copied from [place] [id number]. Topic: Quick Communication - Rx Refill/Question >> Sep 13, 2017  2:55 PM Percell Belt A wrote: Medication: [employer] true Metric  meter/  true plus 33 Gage Latus / single use alcohol swab  Has the patient contacted their pharmacy? No  (Agent: If no, request that the patient contact the pharmacy for the refill.) Preferred Pharmacy (with phone number or street name): [facility] mail order  Agent: Please be advised that RX refills may take up to 3 business days. We ask that you follow-up with your pharmacy.

## 2017-09-13 NOTE — Telephone Encounter (Signed)
See note

## 2017-09-14 ENCOUNTER — Ambulatory Visit: Payer: Self-pay

## 2017-09-14 ENCOUNTER — Other Ambulatory Visit: Payer: Self-pay

## 2017-09-14 MED ORDER — TRUE METRIX METER DEVI: 1.0000 | Freq: Once | 0 refills | Status: AC

## 2017-09-14 MED ORDER — ALCOHOL SWABS 70 % PADS: MEDICATED_PAD | 3 refills | Status: DC

## 2017-09-14 MED ORDER — TRUEPLUS LANCETS 33G MISC: 3 refills | Status: DC

## 2017-09-14 MED ORDER — GLUCOSE BLOOD VI STRP: ORAL_STRIP | 12 refills | Status: DC

## 2017-09-14 NOTE — Telephone Encounter (Signed)
Rx sent to pharmacy   

## 2017-09-15 ENCOUNTER — Encounter: Payer: Self-pay | Admitting: Physical Therapy

## 2017-09-19 DIAGNOSIS — N183 Chronic kidney disease, stage 3 (moderate): Secondary | ICD-10-CM | POA: Diagnosis not present

## 2017-09-19 DIAGNOSIS — Z794 Long term (current) use of insulin: Secondary | ICD-10-CM | POA: Diagnosis not present

## 2017-09-19 DIAGNOSIS — H409 Unspecified glaucoma: Secondary | ICD-10-CM | POA: Diagnosis not present

## 2017-09-19 DIAGNOSIS — E1122 Type 2 diabetes mellitus with diabetic chronic kidney disease: Secondary | ICD-10-CM | POA: Diagnosis not present

## 2017-09-19 DIAGNOSIS — E1165 Type 2 diabetes mellitus with hyperglycemia: Secondary | ICD-10-CM | POA: Diagnosis not present

## 2017-09-19 DIAGNOSIS — I152 Hypertension secondary to endocrine disorders: Secondary | ICD-10-CM | POA: Diagnosis not present

## 2017-09-19 DIAGNOSIS — E1159 Type 2 diabetes mellitus with other circulatory complications: Secondary | ICD-10-CM | POA: Diagnosis not present

## 2017-09-19 DIAGNOSIS — I4891 Unspecified atrial fibrillation: Secondary | ICD-10-CM | POA: Diagnosis not present

## 2017-09-19 DIAGNOSIS — E785 Hyperlipidemia, unspecified: Secondary | ICD-10-CM | POA: Diagnosis not present

## 2017-09-22 ENCOUNTER — Encounter: Payer: Self-pay | Admitting: Physical Therapy

## 2017-09-22 ENCOUNTER — Other Ambulatory Visit: Payer: Self-pay

## 2017-09-22 MED ORDER — GLUCOSE BLOOD VI STRP: ORAL_STRIP | 3 refills | Status: DC

## 2017-09-25 ENCOUNTER — Other Ambulatory Visit: Payer: Self-pay

## 2017-09-25 MED ORDER — INSULIN LISPRO 100 UNIT/ML (KWIKPEN): 6.0000 [IU] | PEN_INJECTOR | Freq: Three times a day (TID) | SUBCUTANEOUS | 3 refills | Status: DC

## 2017-09-27 DIAGNOSIS — E785 Hyperlipidemia, unspecified: Secondary | ICD-10-CM | POA: Diagnosis not present

## 2017-09-27 DIAGNOSIS — E1159 Type 2 diabetes mellitus with other circulatory complications: Secondary | ICD-10-CM | POA: Diagnosis not present

## 2017-09-27 DIAGNOSIS — N183 Chronic kidney disease, stage 3 (moderate): Secondary | ICD-10-CM | POA: Diagnosis not present

## 2017-09-27 DIAGNOSIS — I152 Hypertension secondary to endocrine disorders: Secondary | ICD-10-CM | POA: Diagnosis not present

## 2017-09-27 DIAGNOSIS — E1165 Type 2 diabetes mellitus with hyperglycemia: Secondary | ICD-10-CM | POA: Diagnosis not present

## 2017-09-27 DIAGNOSIS — H409 Unspecified glaucoma: Secondary | ICD-10-CM | POA: Diagnosis not present

## 2017-09-27 DIAGNOSIS — I4891 Unspecified atrial fibrillation: Secondary | ICD-10-CM | POA: Diagnosis not present

## 2017-09-27 DIAGNOSIS — Z794 Long term (current) use of insulin: Secondary | ICD-10-CM | POA: Diagnosis not present

## 2017-09-27 DIAGNOSIS — E1122 Type 2 diabetes mellitus with diabetic chronic kidney disease: Secondary | ICD-10-CM | POA: Diagnosis not present

## 2017-09-29 ENCOUNTER — Other Ambulatory Visit: Payer: Self-pay

## 2017-09-29 NOTE — Patient Outreach (Addendum)
[place]) Care Management  09/29/2017  Eliam Snapp 10/13/1940 [id number]   Care Coordination: RNCM called to reschedule home visit. No answer. HIPPA compliant message left.  Plan: send outreach letter.  Thea Silversmith, RN, MSN, [place] Coordinator Cell: [phone]  Addendum: Received return call from client. Home visit scheduled for next week.  Thea Silversmith, RN, MSN, [place] Coordinator Cell: [phone]

## 2017-10-04 ENCOUNTER — Ambulatory Visit: Payer: Medicare PPO

## 2017-10-04 ENCOUNTER — Other Ambulatory Visit: Payer: Self-pay | Admitting: Family Medicine

## 2017-10-05 ENCOUNTER — Telehealth: Payer: Self-pay | Admitting: Family Medicine

## 2017-10-05 ENCOUNTER — Other Ambulatory Visit: Payer: Self-pay

## 2017-10-05 MED ORDER — INSULIN DEGLUDEC 100 UNIT/ML ~~LOC~~ SOPN: 35.0000 [IU] | PEN_INJECTOR | Freq: Every day | SUBCUTANEOUS | 5 refills | Status: DC

## 2017-10-05 MED ORDER — INSULIN LISPRO 100 UNIT/ML (KWIKPEN): PEN_INJECTOR | SUBCUTANEOUS | 3 refills | Status: DC

## 2017-10-05 NOTE — Patient Outreach (Signed)
[facility]) Care Management   10/05/2017  David Ford January 13, 1941 [id number]  David Ford is an 77 y.o. male  Subjective: client reports his blood sugar levels are improving.  Objective: BP (!) 159/78   Pulse 61   Resp 18   Ht 1.854 m (6\' 1" )   Wt 218 lb (98.9 kg)   SpO2 95%   BMI 28.76 kg/m   Review of Systems  Respiratory: Negative.        Lungs sounds clear  Cardiovascular: Negative.     Physical Exam skin warm dry, color within normal limits.  Encounter Medications:   Outpatient Encounter Medications as of 10/05/2017  Medication Sig  . Insulin Glargine (LANTUS SOLOSTAR) 100 UNIT/ML Solostar Pen Inject 35 Units into the skin every morning.  . Insulin Pen Needle (PEN NEEDLES) 32G X 5 MM MISC 1 application by Does not apply route 4 (four) times daily as needed (to inject insulin).  . tamsulosin (FLOMAX) 0.4 MG CAPS capsule Take 1 capsule (0.4 mg total) by mouth daily.  . trazodone (DESYREL) 300 MG tablet TAKE 1 TABLET BY MOUTH AT BEDTIME  . TRUEPLUS LANCETS 33G MISC Check blood sugar 4 times daily and as needed  . Vitamin D, Cholecalciferol, 1000 units CAPS Take 1,000 Units by mouth daily.  . [DISCONTINUED] insulin lispro (HUMALOG KWIKPEN) 100 UNIT/ML KiwkPen Inject 0.06 mLs (6 Units total) into the skin 3 (three) times daily.  . Alcohol Swabs 70 % PADS Check blood sugar four times daily and as needed  . amLODipine (NORVASC) 10 MG tablet Take 1 tablet (10 mg total) by mouth daily.  Marland Kitchen atorvastatin (LIPITOR) 10 MG tablet Take 1 tablet (10 mg total) by mouth daily.  . BD PEN NEEDLE NANO U/F 32G X 4 MM MISC U UTD QID PRN  . carvedilol (COREG) 3.125 MG tablet Take 1 tablet (3.125 mg total) by mouth 2 (two) times daily with a meal.  . glucose blood test strip Check blood sugar 4 times daily  . [DISCONTINUED] insulin degludec (TRESIBA FLEXTOUCH) 100 UNIT/ML SOPN FlexTouch Pen Inject 0.35 mLs (35 Units total) into the skin daily. (Patient not taking: Reported  on 10/05/2017)   No facility-administered encounter medications on file as of 10/05/2017.     Functional Status:   In your present state of health, do you have any difficulty performing the following activities: 08/31/2017 07/30/2017  Hearing? N N  Vision? N N  Difficulty concentrating or making decisions? N N  Walking or climbing stairs? N N  Dressing or bathing? N N  Doing errands, shopping? N N  Preparing Food and eating ? N -  Using the Toilet? N -  In the past six months, have you accidently leaked urine? N -  Do you have problems with loss of bowel control? N -  Managing your Medications? N -  Managing your Finances? N -  Housekeeping or managing your Housekeeping? N -  Some recent data might be hidden    Fall/Depression Screening:    Fall Risk  08/31/2017 08/18/2017  Falls in the past year? No No   PHQ 2/9 Scores 08/18/2017  PHQ - 2 Score 0    Assessment:  77 year old with history of diabetes, HTN, hyperlipidemia, Prostate cancer, CKD.  RNCM completed home visit. Follow up regarding diabetes. Client reports he has been following up with primary care as scheduled. He is taking medications as scheduled. 14 day average decreased  from last home visit: 168 to 121 today. And  30 day average decreased from last home visit 253 to 134 today.  A1C decreased from 15.7 on 08/07/17 to 11.5 at last office visit. Client is without questions or concerns at this time.  He reports his insulin has been changed to an insulin that is covered by his plan and he is better able to afford his insulin.  Client is agreeable to transition to health coach for disease management of diabetes.  Plan: referral to health coach.  Thea Silversmith, RN, MSN, [place] Coordinator Cell: [phone]

## 2017-10-05 NOTE — Telephone Encounter (Signed)
MEDICATION: Novolog FlexPen prefilled syringe insulin apart injection - novo Nordisk  69mL prefilled. 100 units/mL  PHARMACY:  [facility] mail delivery  IS THIS A 90 DAY SUPPLY : n/a  IS PATIENT OUT OF MEDICATION: YES  IF NOT; HOW MUCH IS LEFT: n/a  LAST APPOINTMENT DATE: @4 /17/2019  NEXT APPOINTMENT DATE:@5 /13/2019  OTHER COMMENTS: Patient requests the generic version of this medication, because he states the curren version he has is too expensive.    **Let patient know to contact pharmacy at the end of the day to make sure medication is ready. **  ** Please notify patient to allow 48-72 hours to process**  **Encourage patient to contact the pharmacy for refills or they can request refills through [facility]**

## 2017-10-05 NOTE — Telephone Encounter (Signed)
Sent in orders to [facility] mail order. Spoke with patient to clarify his medications

## 2017-10-05 NOTE — Telephone Encounter (Signed)
Please review. We discontinued the brand Novolog flex pen due to high costs. Sample of Trulicity given

## 2017-10-05 NOTE — Telephone Encounter (Signed)
Looks like we switched to humalog and tresiba due to insurance/financial issues.  Also, if pt is out of meds, we will need to send to a local pharmacy and not [facility].

## 2017-10-17 ENCOUNTER — Other Ambulatory Visit: Payer: Self-pay

## 2017-10-17 MED ORDER — BD PEN NEEDLE NANO U/F 32G X 4 MM MISC: 3 refills | Status: DC

## 2017-10-19 ENCOUNTER — Other Ambulatory Visit: Payer: Self-pay

## 2017-10-19 NOTE — Patient Outreach (Signed)
[name]) Care Management  10/19/2017  David Ford Sep 20, 1940 [id number]  Initial Health Coach outreach phone call Member was referred from community case manager for continued diabetes self-management education.  Member has a hx per medical record of Type 2 DM, HTN, Hyperlipidemia and chronic kidney disease stage 3.  No answer and HIPPA compliant message left. Plan to send unsuccessful letter  Plan to schedule 2nd call attempt in 3-4 business days  [name], [name] [name] Care Management [phone]

## 2017-10-23 ENCOUNTER — Other Ambulatory Visit: Payer: Self-pay

## 2017-10-23 NOTE — Patient Outreach (Signed)
[facility] [facility]) Care Management  10/23/2017  David Ford 1941-04-24 [id number]   Initial Health Coach outreach phone call Attempt#2 Member was referred from community case manager for continued diabetes self-management education.  Member has a hx per medical record of Type 2 DM, HTN, Hyperlipidemia and chronic kidney disease stage 3.  No answer and HIPAA compliant message left. Plan to schedule 3rd call attempt in 3-4 business days if call not returned Peter Garter RN, [facility] Care Management [phone]

## 2017-10-26 ENCOUNTER — Other Ambulatory Visit: Payer: Self-pay

## 2017-10-26 NOTE — Patient Outreach (Signed)
[place]) Care Management  10/26/2017  David Ford May 27, 1941 [id number]   Initial Health Coach outreach phone call Attempt#3 Member was referred from community case manager for continued diabetes self-management education. Member has a hx per medical record of Type 2 DM, HTN, Hyperlipidemia and chronic kidney disease stage 3.  No answer and HIPAA compliant message left. Plan to close case if call not returned by 11/01/17 Peter Garter RN, [facility] Care Management Coordinator [facility] Care Management [phone]

## 2017-10-30 ENCOUNTER — Ambulatory Visit: Payer: Medicare PPO | Admitting: Family Medicine

## 2017-10-30 ENCOUNTER — Encounter: Payer: Self-pay | Admitting: Family Medicine

## 2017-10-30 VITALS — BP 134/70 | HR 56 | Temp 97.8°F | Resp 14 | Ht 73.0 in | Wt 219.0 lb

## 2017-10-30 DIAGNOSIS — E1165 Type 2 diabetes mellitus with hyperglycemia: Secondary | ICD-10-CM

## 2017-10-30 DIAGNOSIS — E1129 Type 2 diabetes mellitus with other diabetic kidney complication: Secondary | ICD-10-CM

## 2017-10-30 DIAGNOSIS — E1169 Type 2 diabetes mellitus with other specified complication: Secondary | ICD-10-CM | POA: Diagnosis not present

## 2017-10-30 DIAGNOSIS — I10 Essential (primary) hypertension: Secondary | ICD-10-CM

## 2017-10-30 DIAGNOSIS — E1159 Type 2 diabetes mellitus with other circulatory complications: Secondary | ICD-10-CM | POA: Diagnosis not present

## 2017-10-30 DIAGNOSIS — E785 Hyperlipidemia, unspecified: Secondary | ICD-10-CM | POA: Diagnosis not present

## 2017-10-30 DIAGNOSIS — IMO0002 Reserved for concepts with insufficient information to code with codable children: Secondary | ICD-10-CM

## 2017-10-30 LAB — MICROALBUMIN / CREATININE URINE RATIO
CREATININE, U: 122.2 mg/dL
MICROALB UR: 83.6 mg/dL — AB (ref 0.0–1.9)
MICROALB/CREAT RATIO: 68.4 mg/g — AB (ref 0.0–30.0)

## 2017-10-30 NOTE — Assessment & Plan Note (Signed)
Reported blood sugars are at goal.  It is too early to recheck A1c.  We will check urine microalbumin today.  Continue current regimen of Lantus 35 units daily and NovoLog 6 units 3 times daily with meals.  Patient will follow-up in 1 to 3 months to recheck A1c.  We will need to dose adjust insulin based on this result.

## 2017-10-30 NOTE — Progress Notes (Signed)
    Subjective:  David Ford is a 77 y.o. male who presents today with a chief complaint of type 2 diabetes follow-up.   HPI:  T2DM, chronic problem, stable Patient seen about 6 weeks ago for this.  Current regimen includes Lantus 35 units daily and NovoLog 6 units with meals.  Patient reports good compliance to this.  States that his sugars are usually in the 70s to 80s.  Denies any hypoglycemic episodes.  No shakiness, sweating, or palpitations.  He has been having a home health nurse come out to help him which is worked well for him.  Hypertension, chronic problem, stable Patient currently on amlodipine 10 mg daily, Coreg 3.125 mg twice daily.  Tolerates these well without side effects.  Hyperlipidemia, chronic problem, stable Patient on Lipitor 10 mg daily.  Tolerates this well without side effects.  ROS: Per HPI  PMH: He reports that he has never smoked. He has never used smokeless tobacco. He reports that he drinks alcohol. He reports that he does not use drugs.   Objective:  Physical Exam: BP 134/70   Pulse (!) 56   Temp 97.8 F (36.6 C)   Resp 14   Ht 6\' 1"  (1.854 m)   Wt 219 lb (99.3 kg)   SpO2 99%   BMI 28.89 kg/m   Gen: NAD, resting comfortably CV: RRR with no murmurs appreciated Pulm: NWOB, CTAB with no crackles, wheezes, or rhonchi  Assessment/Plan:  DM type 2, uncontrolled, with renal complications (HCC) Reported blood sugars are at goal.  It is too early to recheck A1c.  We will check urine microalbumin today.  Continue current regimen of Lantus 35 units daily and NovoLog 6 units 3 times daily with meals.  Patient will follow-up in 1 to 3 months to recheck A1c.  We will need to dose adjust insulin based on this result.  Hypertension associated with diabetes ([place]) At goal on current medications.  Continue amlodipine 10 mg daily, and Coreg 3.125 mg twice daily.  Hyperlipidemia associated with type 2 diabetes mellitus (HCC) LDL of 134 9 months ago.  Continue  Lipitor 10 mg daily.  He will need repeat lipid panel in about 3 months.  [name]. Jerline Pain, MD 10/30/2017 9:08 AM

## 2017-10-30 NOTE — Assessment & Plan Note (Signed)
At goal on current medications.  Continue amlodipine 10 mg daily, and Coreg 3.125 mg twice daily.

## 2017-10-30 NOTE — Assessment & Plan Note (Addendum)
LDL of 134 9 months ago.  Continue Lipitor 10 mg daily.  He will need repeat lipid panel in about 3 months.

## 2017-10-30 NOTE — Patient Instructions (Signed)
It was very nice to see you today!  I am glad to hear that things are going well.  We will not make any medication changes today.  I would like to check a urine sample to check for any signs of protein in your urine.  This is a marker for your kidney function.  Please come back to see me in 1 to 3 months for her next checkup.  We can recheck your A1c at that time.  I hope you have a fun trip to [place]!  Take care, Dr Jerline Pain

## 2017-11-03 ENCOUNTER — Other Ambulatory Visit: Payer: Self-pay

## 2017-11-03 NOTE — Patient Outreach (Signed)
[facility]) Care Management  11/03/2017  David Ford Aug 26, 1940 [id number]   Case closed.  Member has not responded to telephone and letter outreach Case closed as RNCM was unable to contact member Case closure letter to be sent to member and provider Peter Garter RN, [facility] Management Coordinator [facility] Care Management [phone]

## 2017-11-11 ENCOUNTER — Other Ambulatory Visit: Payer: Self-pay | Admitting: Family Medicine

## 2017-11-30 ENCOUNTER — Other Ambulatory Visit: Payer: Self-pay | Admitting: Family Medicine

## 2017-11-30 ENCOUNTER — Telehealth: Payer: Self-pay | Admitting: Family Medicine

## 2017-11-30 NOTE — Telephone Encounter (Signed)
Copied from [place] [id number]. Topic: Quick Communication - Rx Refill/Question >> Nov 30, 2017  5:13 PM Neva Seat wrote: amLODipine (NORVASC) 10 MG tablet Glimepiride 4 mg carvedilol (COREG) 3.125 MG tablet tamsulosin (FLOMAX) 0.4 MG CAPS capsule   [facility] - [place], [place] [place] [address] Phone: [phone] Fax: [phone]

## 2017-11-30 NOTE — Telephone Encounter (Unsigned)
Copied from [place] [id number]. Topic: Quick Communication - Rx Refill/Question >> Nov 30, 2017  5:21 PM Neva Seat wrote: Medication: ***  Has the patient contacted their pharmacy? {yes [id number]} (Agent: If no, request that the patient contact the pharmacy for the refill.) (Agent: If yes, when and what did the pharmacy advise?)  Preferred Pharmacy (with phone number or street name): ***  Agent: Please be advised that RX refills may take up to 3 business days. We ask that you follow-up with your pharmacy.

## 2017-12-01 ENCOUNTER — Other Ambulatory Visit: Payer: Self-pay | Admitting: *Deleted

## 2017-12-01 MED ORDER — TAMSULOSIN HCL 0.4 MG PO CAPS: 0.4000 mg | ORAL_CAPSULE | Freq: Every day | ORAL | 1 refills | Status: DC

## 2017-12-01 MED ORDER — CARVEDILOL 3.125 MG PO TABS: 3.1250 mg | ORAL_TABLET | Freq: Two times a day (BID) | ORAL | 1 refills | Status: DC

## 2017-12-01 NOTE — Telephone Encounter (Signed)
Remainnig refills of carvedilol and Flomax forwarded to mail order pharmacy per patient request.  LOV: 10/30/17 and f/u for these meds is 12/19. See following PC note. Left message about the 4 mg medication- not on list.

## 2017-12-01 NOTE — Telephone Encounter (Signed)
Remainnig refills of carvedilol and Flomax forwarded to mail order pharmacy per patient request.  LOV: 10/30/17 and f/u for these meds is 12/19. See following PC note.

## 2017-12-04 ENCOUNTER — Other Ambulatory Visit: Payer: Self-pay

## 2017-12-04 MED ORDER — CARVEDILOL 3.125 MG PO TABS: 3.1250 mg | ORAL_TABLET | Freq: Two times a day (BID) | ORAL | 1 refills | Status: DC

## 2017-12-04 MED ORDER — TRAZODONE HCL 300 MG PO TABS: 300.0000 mg | ORAL_TABLET | Freq: Every day | ORAL | 1 refills | Status: DC

## 2017-12-08 ENCOUNTER — Telehealth: Payer: Self-pay | Admitting: Family Medicine

## 2017-12-08 ENCOUNTER — Other Ambulatory Visit: Payer: Self-pay

## 2017-12-08 MED ORDER — TRAZODONE HCL 150 MG PO TABS: 300.0000 mg | ORAL_TABLET | Freq: Every day | ORAL | 1 refills | Status: DC

## 2017-12-08 NOTE — Telephone Encounter (Signed)
Copied from [place] [phone]. Topic: General - Other >> Dec 08, 2017 10:18 AM Yvette Rack wrote: Reason for CRM:   [place], [place] [phone] (Phone) [phone] (Fax)    calling stating that they never received the RX for Glimepiride 4 mg

## 2017-12-08 NOTE — Telephone Encounter (Signed)
Copied from [place] [phone]. Topic: General - Other >> Dec 08, 2017 11:50 AM Yvette Rack wrote: Reason for CRM: patient calling stating that he had spoken with his insurance and they told him that the trazodone (DESYREL) 300 MG tablet cost $130 but if its changed to the 150mg  day twice daily he wouldn't have a copay at all please send in new RX written into the  [place], [place] [phone] (Phone) [phone] (Fax)

## 2017-12-08 NOTE — Telephone Encounter (Signed)
Patient no longer takes glimepiride.  Medication was stopped in February 2019.

## 2017-12-08 NOTE — Telephone Encounter (Signed)
Rx has been sent to pharmacy

## 2018-01-23 ENCOUNTER — Telehealth: Payer: Self-pay | Admitting: Family Medicine

## 2018-01-23 NOTE — Telephone Encounter (Signed)
Copied from [place] [id number]. Topic: Quick Communication - Rx Refill/Question >> Jan 23, 2018 11:19 AM Oliver Pila B wrote: Medication: novalog pens, novalog flex touch  Has the patient contacted their pharmacy? Yes.   (Agent: If no, request that the patient contact the pharmacy for the refill.) (Agent: If yes, when and what did the pharmacy advise?)  Preferred Pharmacy (with phone number or street name): [facility]  Agent: Please be advised that RX refills may take up to 3 business days. We ask that you follow-up with your pharmacy.

## 2018-01-23 NOTE — Telephone Encounter (Signed)
Left message for pt to return call to office to clarify medication refill. Pt does not have Novolog Flex touch on current medication list.

## 2018-01-23 NOTE — Telephone Encounter (Signed)
Noted  

## 2018-01-23 NOTE — Telephone Encounter (Signed)
See note

## 2018-01-25 ENCOUNTER — Other Ambulatory Visit: Payer: Self-pay

## 2018-01-25 MED ORDER — INSULIN ASPART 100 UNIT/ML FLEXPEN: 6.0000 [IU] | PEN_INJECTOR | Freq: Three times a day (TID) | SUBCUTANEOUS | 11 refills | Status: DC

## 2018-01-25 NOTE — Telephone Encounter (Signed)
We had switched to humalog from novolog due to insurance coverage. I am ok to send in novolog but need to verify that it is the one his insurance will cover.  [name]. Jerline Pain, MD 01/25/2018 1:00 PM

## 2018-01-25 NOTE — Telephone Encounter (Signed)
Rx has been sent to pharmacy

## 2018-01-25 NOTE — Telephone Encounter (Signed)
Patient would like refill.  Not on current medication list.  States he received this in the emergency room.  Please advise.

## 2018-01-31 ENCOUNTER — Ambulatory Visit (INDEPENDENT_AMBULATORY_CARE_PROVIDER_SITE_OTHER): Payer: Medicare PPO | Admitting: Family Medicine

## 2018-01-31 ENCOUNTER — Encounter: Payer: Self-pay | Admitting: Family Medicine

## 2018-01-31 VITALS — BP 136/78 | HR 57 | Temp 97.8°F | Ht 73.0 in | Wt 233.6 lb

## 2018-01-31 DIAGNOSIS — E1159 Type 2 diabetes mellitus with other circulatory complications: Secondary | ICD-10-CM | POA: Diagnosis not present

## 2018-01-31 DIAGNOSIS — E1129 Type 2 diabetes mellitus with other diabetic kidney complication: Secondary | ICD-10-CM | POA: Diagnosis not present

## 2018-01-31 DIAGNOSIS — E785 Hyperlipidemia, unspecified: Secondary | ICD-10-CM

## 2018-01-31 DIAGNOSIS — E1165 Type 2 diabetes mellitus with hyperglycemia: Secondary | ICD-10-CM | POA: Diagnosis not present

## 2018-01-31 DIAGNOSIS — E1169 Type 2 diabetes mellitus with other specified complication: Secondary | ICD-10-CM | POA: Diagnosis not present

## 2018-01-31 DIAGNOSIS — H409 Unspecified glaucoma: Secondary | ICD-10-CM | POA: Diagnosis not present

## 2018-01-31 DIAGNOSIS — I10 Essential (primary) hypertension: Secondary | ICD-10-CM

## 2018-01-31 DIAGNOSIS — IMO0002 Reserved for concepts with insufficient information to code with codable children: Secondary | ICD-10-CM

## 2018-01-31 LAB — POCT GLYCOSYLATED HEMOGLOBIN (HGB A1C): HEMOGLOBIN A1C: 7.7 % — AB (ref 4.0–5.6)

## 2018-01-31 MED ORDER — LATANOPROST 0.005 % OP SOLN: 1.0000 [drp] | Freq: Every day | OPHTHALMIC | 3 refills | Status: DC

## 2018-01-31 MED ORDER — INSULIN GLARGINE 100 UNIT/ML SOLOSTAR PEN: 20.0000 [IU] | PEN_INJECTOR | Freq: Every morning | SUBCUTANEOUS | 2 refills | Status: DC

## 2018-01-31 MED ORDER — SEMAGLUTIDE(0.25 OR 0.5MG/DOS) 2 MG/1.5ML ~~LOC~~ SOPN: 0.2500 mg | PEN_INJECTOR | SUBCUTANEOUS | 3 refills | Status: DC

## 2018-01-31 NOTE — Assessment & Plan Note (Signed)
A1c much improved to 7.7.  He will no longer be able to afford rapid acting insulin.  We will start him on GLP-1 agonist Ozempic today.  He will continue taking Lantus 20 units daily.  Discussed importance of routine glucose monitoring while adjusting medications.  He will contact me if his sugars are persistently elevated above 150 or if he has symptomatic lows.  Otherwise, he will follow-up with me in 3 months.

## 2018-01-31 NOTE — Assessment & Plan Note (Signed)
Stable.  Continue atorvastatin 10 mg daily.  Check lipid panel with next blood draw.

## 2018-01-31 NOTE — Assessment & Plan Note (Signed)
Stable.  Refilled Xalatan drops today.  Will place referral to ophthalmology for further monitoring and evaluation.

## 2018-01-31 NOTE — Assessment & Plan Note (Signed)
At goal.  Continue Norvasc 10 mg daily and Coreg 3.125 mg twice daily.

## 2018-01-31 NOTE — Patient Instructions (Addendum)
It was very nice to see you today!  Your A1c looks great today.   We will stop your 3 times daily insulin.  Continue taking Lantus 20 units daily.  Please start Ozempic.  You should inject 0.25 mg once weekly.  Please continue to check your blood sugars and let me know if they are running higher than 150.  Please let me know if you have any other side effects.  I will refill your eyedrops today.  I will also refer you to an eye doctor.  Come back to see me in 3 months, or sooner as needed.  Take care, Dr Jerline Pain

## 2018-01-31 NOTE — Progress Notes (Signed)
   Subjective:  David Ford is a 77 y.o. male who presents today with a chief complaint of T2DM follow up.   HPI:  T2DM, chronic problem, stable Last seen 3 months ago. At that time was doing well on Lantus 35U daily and novolog 6U TID with meals. Sugars are usually running below 150. Usually takes 20U of lantus daily and the 6U rapid acting insulin with meals.  States that his NovoLog is no longer covered under his insurance plan will be several hundred dollars per month.  He is interested in switching to another medication.   HTN, chronic problem, stable Currently on amlodipine 10mg  daily and coreg 3.125mg  bid. Tolerating well without reported side effects.   HLD, chronic problem, stable Currently on lipitor 10mg  daily. Tolerating well without reported side effects.   Glaucoma, chronic problem, stable He needs a refill on xalatan drops.  Has not yet established with an ophthalmologist in [place].  Vision is stable.  No eye pain.  ROS: Per HPI  PMH: He reports that he has never smoked. He has never used smokeless tobacco. He reports that he drinks alcohol. He reports that he does not use drugs.  Objective:  Physical Exam: BP 136/78 (BP Location: Left Arm, Patient Position: Sitting, Cuff Size: Normal)   Pulse (!) 57   Temp 97.8 F (36.6 C) (Oral)   Ht 6\' 1"  (1.854 m)   Wt 233 lb 9.6 oz (106 kg)   SpO2 97%   BMI 30.82 kg/m   Wt Readings from Last 3 Encounters:  01/31/18 233 lb 9.6 oz (106 kg)  10/30/17 219 lb (99.3 kg)  10/05/17 218 lb (98.9 kg)  Gen: NAD, resting comfortably CV: RRR with no murmurs appreciated Pulm: NWOB, CTAB with no crackles, wheezes, or rhonchi  Results for orders placed or performed in visit on 01/31/18 (from the past 24 hour(s))  POCT glycosylated hemoglobin (Hb A1C)     Status: Abnormal   Collection Time: 01/31/18  8:19 AM  Result Value Ref Range   Hemoglobin A1C 7.7 (A) 4.0 - 5.6 %   HbA1c POC (<> result, manual entry)     HbA1c, POC  (prediabetic range)     HbA1c, POC (controlled diabetic range)       Assessment/Plan:  Hypertension associated with diabetes ([place]) At goal.  Continue Norvasc 10 mg daily and Coreg 3.125 mg twice daily.  Hyperlipidemia associated with type 2 diabetes mellitus (HCC) Stable.  Continue atorvastatin 10 mg daily.  Check lipid panel with next blood draw.  Glaucoma Stable.  Refilled Xalatan drops today.  Will place referral to ophthalmology for further monitoring and evaluation.  DM type 2, uncontrolled, with renal complications (HCC) D5H much improved to 7.7.  He will no longer be able to afford rapid acting insulin.  We will start him on GLP-1 agonist Ozempic today.  He will continue taking Lantus 20 units daily.  Discussed importance of routine glucose monitoring while adjusting medications.  He will contact me if his sugars are persistently elevated above 150 or if he has symptomatic lows.  Otherwise, he will follow-up with me in 3 months.  [name]. Jerline Pain, MD 01/31/2018 8:29 AM

## 2018-02-05 ENCOUNTER — Telehealth: Payer: Self-pay | Admitting: Family Medicine

## 2018-02-05 NOTE — Telephone Encounter (Signed)
He can't take metformin due to his kidneys.  Would they cover any other option like trulicity or victoza?  [name]. Jerline Pain, MD 02/05/2018 4:26 PM

## 2018-02-05 NOTE — Telephone Encounter (Signed)
Copied from [place] [id number]. Topic: General - Other >> Feb 05, 2018  1:15 PM Yvette Rack wrote: Reason for CRM: pt calling stating that the Semaglutide [facility]) 0.25 or 0.5 MG/DOSE SOPN is a tier 3 drug that cost over $600 and that his insurance recommend pt to be on metformin or Pioglotazone

## 2018-02-05 NOTE — Telephone Encounter (Signed)
Please advise.  Prior Josem Kaufmann will not help in this situation.  [employer]'s cost is over $600 out of pocket with his particular plan.

## 2018-02-06 ENCOUNTER — Other Ambulatory Visit: Payer: Self-pay | Admitting: Family Medicine

## 2018-02-06 NOTE — Telephone Encounter (Signed)
Please send in trulicity 0.75mg  inject once weekly. Dispense 4 pens with 2 refills.   [name]. Jerline Pain, MD 02/06/2018 4:38 PM

## 2018-02-06 NOTE — Telephone Encounter (Signed)
Currently checking on coverage for Trulicity.

## 2018-02-06 NOTE — Telephone Encounter (Signed)
Received Biochemist, clinical for Trulicity.  Will not know exact out of pocket cost for patient until it is sent in to the pharmacy.

## 2018-02-07 ENCOUNTER — Other Ambulatory Visit: Payer: Self-pay

## 2018-02-07 MED ORDER — DULAGLUTIDE 0.75 MG/0.5ML ~~LOC~~ SOAJ: 0.7500 mg | SUBCUTANEOUS | 2 refills | Status: DC

## 2018-02-07 NOTE — Telephone Encounter (Signed)
Rx sent to pharmacy   

## 2018-02-28 ENCOUNTER — Other Ambulatory Visit: Payer: Self-pay | Admitting: Family Medicine

## 2018-03-27 NOTE — Progress Notes (Signed)
[facility] Note  04/02/2018     CHIEF COMPLAINT Patient presents for Diabetic Eye Exam   HISTORY OF PRESENT ILLNESS: David Ford is a 77 y.o. male who presents to the clinic today for:   HPI    Diabetic Eye Exam    Vision is stable.  Associated Symptoms Negative for Flashes, Blind Spot, Photophobia, Scalp Tenderness, Fever, Weight Loss, Jaw Claudication, Glare, Pain, Floaters, Distortion, Redness, Trauma, Shoulder/Hip pain and Fatigue.  Diabetes characteristics include Type 2 and taking oral medications.  This started 10 years ago.  Blood sugar level is controlled.  Last Blood Glucose 150.  Last A1C 6.  I, the attending physician,  performed the HPI with the patient and updated documentation appropriately.          Comments    Pt presents on the referral of Dr. Dimas Chyle for DM exam, pt states he has had laser sx for glaucoma in [place], Dr. Waynard Edwards performed the sx about 2 years ago, pt uses Latanoprost OU once a day, pt states IOP usually runs between 9-13, pt states he moved back to this area about a year ago and has not had an eye exam since he moved back, pt is Type 2 diabetic, dx about 10 years ago, pt states he thinks his last A1C was around 6 and his blood sugar this morning was 150       Last edited by Bernarda Caffey, MD on 04/02/2018  2:42 PM. (History)    Pt reports moving here x 1 year ago; Pt states PCP referred him here for routine DM exam;  Was seeing Dr. Suella Grove in [facility] for glaucoma management;   Referring physician: [name], MD [address] [place], [place] [address]  HISTORICAL INFORMATION:   Selected notes from the MEDICAL RECORD NUMBER Referred by Dr. Dimas Chyle for DM exam LEE:  Ocular Hx-Glaucoma PMH-DM (A1C: 7.7, takes Ozempic / Lantus), HTN, HLD    CURRENT MEDICATIONS: Current Outpatient Medications (Ophthalmic Drugs)  Medication Sig  . brimonidine (ALPHAGAN) 0.15 % ophthalmic solution Place 1 drop  into both eyes every 8 (eight) hours.  . dorzolamide-timolol (COSOPT) 22.3-6.8 MG/ML ophthalmic solution Place 1 drop into both eyes 2 (two) times daily.  Marland Kitchen latanoprost (XALATAN) 0.005 % ophthalmic solution INSTILL 1 DROP INTO BOTH EYES AT BEDTIME   No current facility-administered medications for this visit.  (Ophthalmic Drugs)   Current Outpatient Medications (Other)  Medication Sig  . Alcohol Swabs 70 % PADS Check blood sugar four times daily and as needed  . amLODipine (NORVASC) 10 MG tablet Take 1 tablet (10 mg total) by mouth daily.  Marland Kitchen atorvastatin (LIPITOR) 10 MG tablet TAKE 1 TABLET EVERY DAY  . BD PEN NEEDLE NANO U/F 32G X 4 MM MISC Use with pen injector daily  . Blood Glucose Monitoring Suppl (TRUE METRIX AIR GLUCOSE METER) w/Device KIT   . carvedilol (COREG) 3.125 MG tablet TAKE 1 TABLET TWICE DAILY WITH MEALS  . Dulaglutide (TRULICITY) 8.85 OY/7.7AJ SOPN Inject 0.75 mg into the skin once a week.  Marland Kitchen glimepiride (AMARYL) 4 MG tablet TAKE 1 TABLET TWICE DAILY  . glucose blood test strip Check blood sugar 4 times daily  . Insulin Glargine (LANTUS SOLOSTAR) 100 UNIT/ML Solostar Pen Inject 20 Units into the skin every morning.  . Insulin Pen Needle (PEN NEEDLES) 32G X 5 MM MISC 1 application by Does not apply route 4 (four) times daily as needed (to inject insulin).  Marland Kitchen  Semaglutide (OZEMPIC) 0.25 or 0.5 MG/DOSE SOPN Inject 0.25 mg into the skin once a week.  . tamsulosin (FLOMAX) 0.4 MG CAPS capsule TAKE 1 CAPSULE EVERY DAY  . traZODone (DESYREL) 150 MG tablet TAKE 2 TABLETS AT BEDTIME  . TRESIBA FLEXTOUCH 100 UNIT/ML SOPN FlexTouch Pen   . TRUEPLUS LANCETS 33G MISC CHECK BLOOD SUGAR FOUR TIMES DAILY  AND AS NEEDED  . Vitamin D, Cholecalciferol, 1000 units CAPS Take 1,000 Units by mouth daily.   No current facility-administered medications for this visit.  (Other)      REVIEW OF SYSTEMS: ROS    Positive for: Endocrine, Cardiovascular, Eyes   Negative for: Constitutional,  Gastrointestinal, Neurological, Skin, Genitourinary, Musculoskeletal, HENT, Respiratory, Psychiatric, Allergic/Imm, Heme/Lymph   Last edited by Debbrah Alar, COT on 04/02/2018  1:31 PM. (History)       ALLERGIES No Known Allergies  PAST MEDICAL HISTORY Past Medical History:  Diagnosis Date  . AKI (acute kidney injury) ([place]) 06/18/2015  . Cancer (HCC)    hx of prostate cancer  . Chronic kidney disease   . Diabetes mellitus without complication ([place])   . DKA (diabetic ketoacidoses) ([place]) 06/18/2015  . Glaucoma   . Hyperlipidemia   . Hypertension    Past Surgical History:  Procedure Laterality Date  . AMPUTATION Left 07/03/2015   Procedure: Left Foot 1st and 2nd Ray Amputation;  Surgeon: Newt Minion, MD;  Location: [facility];  Service: Orthopedics;  Laterality: Left;  . BASCILIC VEIN TRANSPOSITION Left 07/14/2015   Procedure: BRACHIOBASILIC VEIN TRANSPOSITION  ;  Surgeon: Elam Dutch, MD;  Location: [facility];  Service: Vascular;  Laterality: Left;  . IRIDOTOMY / IRIDECTOMY    . prostate seeds      FAMILY HISTORY Family History  Problem Relation Age of Onset  . Diabetes Mother   . Hypertension Mother   . Stroke Mother   . Diabetes Sister   . Glaucoma Sister   . Diabetes Brother   . Glaucoma Brother   . Amblyopia Neg Hx   . Blindness Neg Hx   . Cataracts Neg Hx   . Macular degeneration Neg Hx   . Retinal detachment Neg Hx   . Strabismus Neg Hx   . Retinitis pigmentosa Neg Hx     SOCIAL HISTORY Social History   Tobacco Use  . Smoking status: Never Smoker  . Smokeless tobacco: Never Used  Substance Use Topics  . Alcohol use: Yes    Comment: once a week  . Drug use: No         OPHTHALMIC EXAM:  Base Eye Exam    Visual Acuity (Snellen - Linear)      Right Left   Dist [place] 20/30 -2 20/40   Dist ph [place] 20/25 -2 NI       Tonometry (Tonopen, 1:44 PM)      Right Left   Pressure 25 35       Tonometry #2 (Tonopen, 1:44 PM)      Right Left   Pressure 22  36       Pupils      Dark Light Shape React APD   Right 3 2 Round Slow None   Left 3 2 Round Slow None       Visual Fields (Counting fingers)      Left Right    Full Full       Extraocular Movement      Right Left    Full, Ortho Full, Ortho  Neuro/Psych    Oriented x3:  Yes   Mood/Affect:  Normal       Dilation    Both eyes:  1.0% Mydriacyl, 2.5% Phenylephrine @ 2:13 PM        Slit Lamp and Fundus Exam    Slit Lamp Exam      Right Left   Lids/Lashes Dermatochalasis - upper lid, mild Meibomian gland dysfunction Dermatochalasis - upper lid, Meibomian gland dysfunction   Conjunctiva/Sclera Melanosis Melanosis   Cornea 2+ Punctate epithelial erosions, Arcus 2+ Punctate epithelial erosions, Arcus   Anterior Chamber Deep and quiet Deep and quiet   Iris Round and moderately dilated to 5 mm, No NVI Round and moderately dilated to 5 mm, No NVI   Lens PC IOL in good position PC IOL in good position   Vitreous Vitreous syneresis Vitreous syneresis       Fundus Exam      Right Left   Disc Pink and Sharp, No NVD 2+ Pallor, +cupping, Temporal Peripapillary atrophy   C/D Ratio 0.4 0.85   Macula Blunted foveal reflex, Retinal pigment epithelial mottling, No heme or edema Blunted foveal reflex, Epiretinal membrane, Retinal pigment epithelial mottling, No heme or edema   Vessels Vascular attenuation Vascular attenuation, mildly Tortuous   Periphery Attached, No heme Attached, No heme        Refraction    Manifest Refraction      Sphere Cylinder Dist VA   Right [place] Sphere 20/30++   Left -1.00 Sphere 20/25          IMAGING AND PROCEDURES  Imaging and Procedures for _0 @  OCT, Retina - OU - Both Eyes       Right Eye Quality was good. Central Foveal Thickness: 248. Progression has no prior data. Findings include normal foveal contour, no IRF, no SRF.   Left Eye Quality was good. Central Foveal Thickness: 240. Progression has no prior data. Findings include  abnormal foveal contour, no IRF, no SRF (Diffuse retinal thinning inferior hemisphere, No IRF/SRF).   Notes *Images captured and stored on drive  Diagnosis / Impression:  OD: NFP, No IRF/SRF OS: Diffuse retinal thinning inferior hemisphere, No IRF/SRF  Clinical management:  See below  Abbreviations: NFP - Normal foveal profile. CME - cystoid macular edema. PED - pigment epithelial detachment. IRF - intraretinal fluid. SRF - subretinal fluid. EZ - ellipsoid zone. ERM - epiretinal membrane. ORA - outer retinal atrophy. ORT - outer retinal tubulation. SRHM - subretinal hyper-reflective material                  ASSESSMENT/PLAN:    ICD-10-CM   1. Diabetes mellitus type 2 without retinopathy ([place]) E11.9   2. Essential hypertension I10   3. Hypertensive retinopathy of both eyes H35.033   4. Retinal edema H35.81 OCT, Retina - OU - Both Eyes  5. Primary open angle glaucoma (POAG) of both eyes, severe stage H40.1133     1. Diabetes mellitus, type 2 without retinopathy - The incidence, risk factors for progression, natural history and treatment options for diabetic retinopathy  were discussed with patient.   - The need for close monitoring of blood glucose, blood pressure, and serum lipids, avoiding cigarette or any type of tobacco, and the need for long term follow up was also discussed with patient. - f/u in 1 year, sooner prn  2, 3.  Hypertensive retinopathy OU - discussed importance of tight BP control - monitor  4. No retinal edema on exam or OCT  5. POAG OU, OS>>OD-  - previously followed by Dr. Suella Grove in [place] - IOP today OD: 23 OS: 38 with Goldmann TA - OS>>OD with disc cupping and pallor and IOP - currently using Latanoprost OU QD - start cosopt and brimonidine OU BID -- samples given to patient - will refer to Dr. Read Drivers for full glaucoma evaluation and work up  6. Pseudophakia OU  - s/p CE/IOL  - beautiful surgery, doing well  - monitor     Ophthalmic Meds Ordered this visit:  Meds ordered this encounter  Medications  . dorzolamide-timolol (COSOPT) 22.3-6.8 MG/ML ophthalmic solution    Sig: Place 1 drop into both eyes 2 (two) times daily.    Dispense:  10 mL    Refill:  0  . brimonidine (ALPHAGAN) 0.15 % ophthalmic solution    Sig: Place 1 drop into both eyes every 8 (eight) hours.    Dispense:  10 mL    Refill:  0       Return in about 1 year (around 04/03/2019) for [facility] DM exam, DFE, OCT.  There are no Patient Instructions on file for this visit.   Explained the diagnoses, plan, and follow up with the patient and they expressed understanding.  Patient expressed understanding of the importance of proper follow up care.   This document serves as a record of services personally performed by Gardiner Sleeper, MD, PhD. It was created on their behalf by Ernest Mallick, OA, an ophthalmic assistant. The creation of this record is the provider's dictation and/or activities during the visit.    Electronically signed by: Ernest Mallick, OA  10.08.19 5:25 PM   This document serves as a record of services personally performed by Gardiner Sleeper, MD, PhD. It was created on their behalf by Catha Brow, [place], a certified ophthalmic assistant. The creation of this record is the provider's dictation and/or activities during the visit.  Electronically signed by: Catha Brow, COA  10.14.19 5:25 PM    Gardiner Sleeper, M.D., Ph.D. Diseases & Surgery of the Retina and Vitreous [facility]   I have reviewed the above documentation for accuracy and completeness, and I agree with the above. Gardiner Sleeper, M.D., Ph.D. 04/03/18 5:25 PM    Abbreviations: M myopia (nearsighted); A astigmatism; H hyperopia (farsighted); P presbyopia; Mrx spectacle prescription;  CTL contact lenses; OD right eye; OS left eye; OU both eyes  XT exotropia; ET esotropia; PEK punctate epithelial keratitis; PEE punctate epithelial  erosions; DES dry eye syndrome; MGD meibomian gland dysfunction; ATs artificial tears; PFAT's preservative free artificial tears; [place] nuclear sclerotic cataract; PSC posterior subcapsular cataract; ERM epi-retinal membrane; PVD posterior vitreous detachment; RD retinal detachment; DM diabetes mellitus; DR diabetic retinopathy; NPDR non-proliferative diabetic retinopathy; PDR proliferative diabetic retinopathy; CSME clinically significant macular edema; DME diabetic macular edema; dbh dot blot hemorrhages; CWS cotton wool spot; POAG primary open angle glaucoma; C/D cup-to-disc ratio; HVF humphrey visual field; GVF goldmann visual field; OCT optical coherence tomography; IOP intraocular pressure; BRVO Branch retinal vein occlusion; CRVO central retinal vein occlusion; CRAO central retinal artery occlusion; BRAO branch retinal artery occlusion; RT retinal tear; SB scleral buckle; PPV pars plana vitrectomy; VH Vitreous hemorrhage; PRP panretinal laser photocoagulation; IVK intravitreal kenalog; VMT vitreomacular traction; MH Macular hole;  NVD neovascularization of the disc; NVE neovascularization elsewhere; AREDS age related eye disease study; ARMD age related macular degeneration; POAG primary open angle glaucoma; EBMD epithelial/anterior basement membrane dystrophy; ACIOL anterior chamber  intraocular lens; IOL intraocular lens; PCIOL posterior chamber intraocular lens; Phaco/IOL phacoemulsification with intraocular lens placement; [place] photorefractive keratectomy; LASIK laser assisted in situ keratomileusis; HTN hypertension; DM diabetes mellitus; COPD chronic obstructive pulmonary disease

## 2018-03-29 ENCOUNTER — Other Ambulatory Visit: Payer: Self-pay | Admitting: Family Medicine

## 2018-04-02 ENCOUNTER — Encounter (INDEPENDENT_AMBULATORY_CARE_PROVIDER_SITE_OTHER): Payer: Self-pay | Admitting: Ophthalmology

## 2018-04-02 ENCOUNTER — Ambulatory Visit (INDEPENDENT_AMBULATORY_CARE_PROVIDER_SITE_OTHER): Payer: Medicare PPO | Admitting: Ophthalmology

## 2018-04-02 DIAGNOSIS — H401133 Primary open-angle glaucoma, bilateral, severe stage: Secondary | ICD-10-CM

## 2018-04-02 DIAGNOSIS — I10 Essential (primary) hypertension: Secondary | ICD-10-CM

## 2018-04-02 DIAGNOSIS — H35033 Hypertensive retinopathy, bilateral: Secondary | ICD-10-CM | POA: Diagnosis not present

## 2018-04-02 DIAGNOSIS — Z961 Presence of intraocular lens: Secondary | ICD-10-CM | POA: Diagnosis not present

## 2018-04-02 DIAGNOSIS — H3581 Retinal edema: Secondary | ICD-10-CM | POA: Diagnosis not present

## 2018-04-02 DIAGNOSIS — E119 Type 2 diabetes mellitus without complications: Secondary | ICD-10-CM

## 2018-04-02 MED ORDER — DORZOLAMIDE HCL-TIMOLOL MAL 2-0.5 % OP SOLN: 1.0000 [drp] | Freq: Two times a day (BID) | OPHTHALMIC | 0 refills | Status: AC

## 2018-04-02 MED ORDER — BRIMONIDINE TARTRATE 0.15 % OP SOLN: 1.0000 [drp] | Freq: Three times a day (TID) | OPHTHALMIC | 0 refills | Status: DC

## 2018-04-03 ENCOUNTER — Encounter (INDEPENDENT_AMBULATORY_CARE_PROVIDER_SITE_OTHER): Payer: Self-pay | Admitting: Ophthalmology

## 2018-04-05 DIAGNOSIS — H401111 Primary open-angle glaucoma, right eye, mild stage: Secondary | ICD-10-CM | POA: Diagnosis not present

## 2018-04-05 DIAGNOSIS — Z961 Presence of intraocular lens: Secondary | ICD-10-CM | POA: Diagnosis not present

## 2018-04-05 DIAGNOSIS — H40052 Ocular hypertension, left eye: Secondary | ICD-10-CM | POA: Diagnosis not present

## 2018-04-05 DIAGNOSIS — H401123 Primary open-angle glaucoma, left eye, severe stage: Secondary | ICD-10-CM | POA: Diagnosis not present

## 2018-04-05 LAB — HM DIABETES EYE EXAM

## 2018-04-26 ENCOUNTER — Encounter: Payer: Self-pay | Admitting: Physical Therapy

## 2018-05-03 ENCOUNTER — Ambulatory Visit: Payer: Medicare PPO | Admitting: Family Medicine

## 2018-05-03 DIAGNOSIS — Z0289 Encounter for other administrative examinations: Secondary | ICD-10-CM

## 2018-05-10 ENCOUNTER — Encounter: Payer: Self-pay | Admitting: Family Medicine

## 2018-05-21 ENCOUNTER — Ambulatory Visit: Payer: Medicare PPO | Admitting: Family Medicine

## 2018-05-21 ENCOUNTER — Encounter: Payer: Self-pay | Admitting: Family Medicine

## 2018-05-21 VITALS — BP 138/84 | HR 55 | Temp 97.8°F | Ht 73.0 in | Wt 231.6 lb

## 2018-05-21 DIAGNOSIS — IMO0002 Reserved for concepts with insufficient information to code with codable children: Secondary | ICD-10-CM

## 2018-05-21 DIAGNOSIS — I10 Essential (primary) hypertension: Secondary | ICD-10-CM

## 2018-05-21 DIAGNOSIS — E785 Hyperlipidemia, unspecified: Secondary | ICD-10-CM | POA: Diagnosis not present

## 2018-05-21 DIAGNOSIS — E1169 Type 2 diabetes mellitus with other specified complication: Secondary | ICD-10-CM | POA: Diagnosis not present

## 2018-05-21 DIAGNOSIS — E1129 Type 2 diabetes mellitus with other diabetic kidney complication: Secondary | ICD-10-CM

## 2018-05-21 DIAGNOSIS — E1159 Type 2 diabetes mellitus with other circulatory complications: Secondary | ICD-10-CM

## 2018-05-21 DIAGNOSIS — I152 Hypertension secondary to endocrine disorders: Secondary | ICD-10-CM

## 2018-05-21 DIAGNOSIS — H409 Unspecified glaucoma: Secondary | ICD-10-CM | POA: Diagnosis not present

## 2018-05-21 DIAGNOSIS — E1165 Type 2 diabetes mellitus with hyperglycemia: Secondary | ICD-10-CM

## 2018-05-21 LAB — POCT GLYCOSYLATED HEMOGLOBIN (HGB A1C): HEMOGLOBIN A1C: 8 % — AB (ref 4.0–5.6)

## 2018-05-21 MED ORDER — INSULIN GLARGINE 100 UNIT/ML SOLOSTAR PEN: 22.0000 [IU] | PEN_INJECTOR | Freq: Every morning | SUBCUTANEOUS | 2 refills | Status: DC

## 2018-05-21 NOTE — Progress Notes (Signed)
   Subjective:  David Ford is a 77 y.o. male who presents today with a chief complaint of T2DM.   HPI:  T2DM Last seen about 3.5 months ago for this. We started him on ozempic at that time. He was continued on lantus 20 units daily.  Patient was unable to afford Ozempic and went back to NovoLog 6 units with breakfast.  Fasting sugars usually in the 150s though has had 1/200.  No symptomatic lows.  No polyuria or polydipsia.  HTN On norvasc 10mg  daily and coreg 3.125mg  bid. Tolerating well.  No reported chest pain.  HLD On lipitor 10mg  daily. No reported myalgias.   Glaucoma/cataracts Having cataract removal in 8 days.  ROS: Per HPI  PMH: He reports that he has never smoked. He has never used smokeless tobacco. He reports that he drinks alcohol. He reports that he does not use drugs.  Objective:  Physical Exam: BP 138/84 (BP Location: Left Arm, Patient Position: Sitting, Cuff Size: Normal)   Pulse (!) 55   Temp 97.8 F (36.6 C) (Oral)   Ht 6\' 1"  (1.854 m)   Wt 231 lb 9.6 oz (105.1 kg)   SpO2 97%   BMI 30.56 kg/m   Gen: NAD, resting comfortably CV: RRR with no murmurs appreciated Pulm: NWOB, CTAB with no crackles, wheezes, or rhonchi Neuro: Grossly normal, moves all extremities Psych: Normal affect and thought content  Results for orders placed or performed in visit on 05/21/18 (from the past 24 hour(s))  POCT glycosylated hemoglobin (Hb A1C)     Status: Abnormal   Collection Time: 05/21/18  8:13 AM  Result Value Ref Range   Hemoglobin A1C 8.0 (A) 4.0 - 5.6 %   HbA1c POC (<> result, manual entry)     HbA1c, POC (prediabetic range)     HbA1c, POC (controlled diabetic range)       Assessment/Plan:  Hypertension associated with diabetes (HCC) At goal.  Continue Norvasc 10 mg daily and Coreg 3.125 mg twice daily.  Hyperlipidemia associated with type 2 diabetes mellitus (HCC) Last LDL 134.  Continue Lipitor 10 mg daily.  Will need lipid panel soon.  DM type 2,  uncontrolled, with renal complications (HCC) C5E elevated 8.  He was not able to afford any GLP agonist.  We will increase his Lantus to 22 units daily.  Continue NovoLog 6 units with the largest meal the day (typically breakfast).  Discussed importance of diet.   Follow-up with me in 3 months.  Repeat A1c at that time.  Glaucoma Having surgery in 8 days.  Follows with [facility] ophthalmology.  [name]. Jerline Pain, MD 05/21/2018 9:31 AM

## 2018-05-21 NOTE — Assessment & Plan Note (Signed)
Having surgery in 8 days.  Follows with [facility] ophthalmology.

## 2018-05-21 NOTE — Assessment & Plan Note (Signed)
Last LDL 134.  Continue Lipitor 10 mg daily.  Will need lipid panel soon.

## 2018-05-21 NOTE — Patient Instructions (Signed)
It was very nice to see you today!  Increase your lantus to 22 units daily. No other changes.  Come back in 3 months, or sooner as needed.   Take care, Dr Jerline Pain

## 2018-05-21 NOTE — Assessment & Plan Note (Addendum)
A1c elevated 8.  He was not able to afford any GLP agonist.  We will increase his Lantus to 22 units daily.  Continue NovoLog 6 units with the largest meal the day (typically breakfast).  Discussed importance of diet.   Follow-up with me in 3 months.  Repeat A1c at that time.

## 2018-05-21 NOTE — Assessment & Plan Note (Signed)
At goal.  Continue Norvasc 10 mg daily and Coreg 3.125 mg twice daily.

## 2018-05-29 DIAGNOSIS — H401123 Primary open-angle glaucoma, left eye, severe stage: Secondary | ICD-10-CM | POA: Diagnosis not present

## 2018-05-29 DIAGNOSIS — H401111 Primary open-angle glaucoma, right eye, mild stage: Secondary | ICD-10-CM | POA: Diagnosis not present

## 2018-05-29 DIAGNOSIS — H40052 Ocular hypertension, left eye: Secondary | ICD-10-CM | POA: Diagnosis not present

## 2018-06-14 ENCOUNTER — Other Ambulatory Visit: Payer: Self-pay | Admitting: Family Medicine

## 2018-07-20 ENCOUNTER — Other Ambulatory Visit: Payer: Self-pay

## 2018-07-20 ENCOUNTER — Telehealth: Payer: Self-pay | Admitting: Family Medicine

## 2018-07-20 MED ORDER — BETAMETHASONE DIPROPIONATE 0.05 % EX CREA: TOPICAL_CREAM | Freq: Two times a day (BID) | CUTANEOUS | 0 refills | Status: DC

## 2018-07-20 NOTE — Telephone Encounter (Signed)
Ok with me. Please place any necessary orders. 

## 2018-07-20 NOTE — Telephone Encounter (Signed)
See note  Copied from [id number]. Topic: General - Other >> Jul 20, 2018  1:42 PM Carolyn Stare wrote:  Pt call to ask if Dr Jerline Pain will RX him Betamethasone dipropionate 0.5 cream. He said he use this cream on his legs for dryness and itch    Pharmacy [facility]

## 2018-07-20 NOTE — Telephone Encounter (Signed)
Rx Request not on med list.Please advise.

## 2018-07-20 NOTE — Telephone Encounter (Signed)
Rx sent to pharmacy   

## 2018-07-23 ENCOUNTER — Ambulatory Visit: Payer: Self-pay | Admitting: *Deleted

## 2018-07-23 NOTE — Telephone Encounter (Signed)
Contacted pt regarding his symptoms; he complains of sinus/nasal congestion and productive cough with yellow secretions; the pt said that he discussed it with Dr Jerline Pain on his 05/21/18 appointment; the pt says that this is getting worse; the pt also would like a refill on fluticasone that another provider order; however it is not on his medication list; the also says that he has not tried OTCs because he does not know what to take; nurse triage initiated and recommendations made per protocol; pt offered and accepted appointment with Dr Dimas Chyle, [facility], 07/24/2018 at 1420; will route to office for notification Reason for Disposition . Cough has been present for > 3 weeks  Answer Assessment - Initial Assessment Questions 1. ONSET: "When did the cough begin?"      Discussed in office 05/21/18 2. SEVERITY: "How bad is the cough today?"      mild 3. RESPIRATORY DISTRESS: "Describe your breathing."      No distress 4. FEVER: "Do you have a fever?" If so, ask: "What is your temperature, how was it measured, and when did it start?"     no 5. SPUTUM: "Describe the color of your sputum" (clear, white, yellow, green)     yellow 6. HEMOPTYSIS: "Are you coughing up any blood?" If so ask: "How much?" (flecks, streaks, tablespoons, etc.)     no 7. CARDIAC HISTORY: "Do you have any history of heart disease?" (e.g., heart attack, congestive heart failure)      hypternsion 8. LUNG HISTORY: "Do you have any history of lung disease?"  (e.g., pulmonary embolus, asthma, emphysema)     no 9. PE RISK FACTORS: "Do you have a history of blood clots?" (or: recent major surgery, recent prolonged travel, bedridden)     no 10. OTHER SYMPTOMS: "Do you have any other symptoms?" (e.g., runny nose, wheezing, chest pain)       Nasal/sinus congestion 11. PREGNANCY: "Is there any chance you are pregnant?" "When was your last menstrual period?"       n/a 12. TRAVEL: "Have you traveled out of the country in the  last month?" (e.g., travel history, exposures)       no  Protocols used: [place]

## 2018-07-23 NOTE — Telephone Encounter (Signed)
See note

## 2018-07-23 NOTE — Telephone Encounter (Signed)
Noted  

## 2018-07-24 ENCOUNTER — Encounter: Payer: Self-pay | Admitting: Family Medicine

## 2018-07-24 ENCOUNTER — Ambulatory Visit: Payer: Medicare PPO | Admitting: Family Medicine

## 2018-07-24 VITALS — BP 118/68 | HR 68 | Temp 97.9°F | Ht 73.0 in | Wt 224.2 lb

## 2018-07-24 DIAGNOSIS — E1129 Type 2 diabetes mellitus with other diabetic kidney complication: Secondary | ICD-10-CM | POA: Diagnosis not present

## 2018-07-24 DIAGNOSIS — IMO0002 Reserved for concepts with insufficient information to code with codable children: Secondary | ICD-10-CM

## 2018-07-24 DIAGNOSIS — J329 Chronic sinusitis, unspecified: Secondary | ICD-10-CM | POA: Diagnosis not present

## 2018-07-24 DIAGNOSIS — E1165 Type 2 diabetes mellitus with hyperglycemia: Secondary | ICD-10-CM

## 2018-07-24 MED ORDER — DULAGLUTIDE 0.75 MG/0.5ML ~~LOC~~ SOAJ: 1.0000 "pen " | SUBCUTANEOUS | 0 refills | Status: DC

## 2018-07-24 MED ORDER — FLUTICASONE PROPIONATE 50 MCG/ACT NA SUSP: 2.0000 | Freq: Every day | NASAL | 6 refills | Status: AC

## 2018-07-24 MED ORDER — AMOXICILLIN-POT CLAVULANATE 875-125 MG PO TABS: 1.0000 | ORAL_TABLET | Freq: Two times a day (BID) | ORAL | 0 refills | Status: DC

## 2018-07-24 MED ORDER — IPRATROPIUM BROMIDE 0.06 % NA SOLN: 2.0000 | Freq: Four times a day (QID) | NASAL | 0 refills | Status: DC

## 2018-07-24 NOTE — Assessment & Plan Note (Signed)
It is too early to recheck A1c today.  He is currently on Lantus 22 units daily and NovoLog 6 units with largest meal the day.  We have previously tried to put him on a GLP agonist however he was unable to afford.  It looks like he had prior authorization approved for Trulicity.  Will restart today.  Samples given.  Follow-up with me in 1 month recheck A1c.

## 2018-07-24 NOTE — Patient Instructions (Addendum)
Start the atrovent and augmentin.  I will refill your flonase as well.   Please stay well hydrated.  You can take tylenol  as needed for low grade fever and pain.  Please let me know if your symptoms worsen or fail to improve.  I will see you back in 1 month to recheck your A1c.   Take care, Dr Jerline Pain

## 2018-07-24 NOTE — Progress Notes (Signed)
   Chief Complaint:  David Ford is a 78 y.o. male who presents for same day appointment with a chief complaint of sinus congestion.   Assessment/Plan:  Sinusitis Given that symptoms have been persistent for several days, will start a course of Augmentin today.  Also start Atrovent and Flonase.  Recommended good oral hydration. Discussed reasons to return to care and seek emergent care. Follow up as needed.  DM type 2, uncontrolled, with renal complications ([place]) It is too early to recheck A1c today.  He is currently on Lantus 22 units daily and NovoLog 6 units with largest meal the day.  We have previously tried to put him on a GLP agonist however he was unable to afford.  It looks like he had prior authorization approved for Trulicity.  Will restart today.  Samples given.  Follow-up with me in 1 month recheck A1c.     Subjective:  HPI:  Sinus Congestion, acute problem Started over a month ago. Stable over that time. No fevers or chills.  Has tried Flonase nasal spray which is helped.  Associated with cough and sputum production.  No ear Ford or pressure.  No chest Ford or shortness of breath.  No other obvious alleviating or aggravating factors.   ROS: Per HPI  PMH: He reports that he has never smoked. He has never used smokeless tobacco. He reports current alcohol use. He reports that he does not use drugs.      Objective:  Physical Exam: BP 118/68 (BP Location: Left Arm, Patient Position: Sitting, Cuff Size: Large)   Pulse 68   Temp 97.9 F (36.6 C) (Oral)   Ht 6\' 1"  (1.854 m)   Wt 224 lb 4 oz (101.7 kg)   SpO2 97%   BMI 29.59 kg/m   Gen: NAD, resting comfortably HEENT: TMs clear.  OP erythematous with no exudate.  Nasal mucosa erythematous and boggy bilaterally with thick, white discharge. CV: Regular rate and rhythm with no murmurs appreciated Pulm: Normal work of breathing, clear to auscultation bilaterally with no crackles, wheezes, or rhonchi      David M. Jerline Pain,  MD 07/24/2018 2:12 PM

## 2018-08-20 ENCOUNTER — Encounter: Payer: Self-pay | Admitting: Family Medicine

## 2018-08-20 ENCOUNTER — Other Ambulatory Visit: Payer: Self-pay

## 2018-08-20 ENCOUNTER — Ambulatory Visit: Payer: Medicare PPO | Admitting: Family Medicine

## 2018-08-20 VITALS — BP 118/66 | HR 64 | Temp 98.0°F | Ht 73.0 in | Wt 219.2 lb

## 2018-08-20 DIAGNOSIS — E1169 Type 2 diabetes mellitus with other specified complication: Secondary | ICD-10-CM

## 2018-08-20 DIAGNOSIS — E785 Hyperlipidemia, unspecified: Secondary | ICD-10-CM

## 2018-08-20 DIAGNOSIS — E1159 Type 2 diabetes mellitus with other circulatory complications: Secondary | ICD-10-CM | POA: Diagnosis not present

## 2018-08-20 DIAGNOSIS — L309 Dermatitis, unspecified: Secondary | ICD-10-CM | POA: Insufficient documentation

## 2018-08-20 DIAGNOSIS — E1165 Type 2 diabetes mellitus with hyperglycemia: Secondary | ICD-10-CM | POA: Diagnosis not present

## 2018-08-20 DIAGNOSIS — E1129 Type 2 diabetes mellitus with other diabetic kidney complication: Secondary | ICD-10-CM

## 2018-08-20 DIAGNOSIS — N183 Chronic kidney disease, stage 3 unspecified: Secondary | ICD-10-CM

## 2018-08-20 DIAGNOSIS — IMO0002 Reserved for concepts with insufficient information to code with codable children: Secondary | ICD-10-CM

## 2018-08-20 DIAGNOSIS — I10 Essential (primary) hypertension: Secondary | ICD-10-CM

## 2018-08-20 LAB — CBC
HCT: 44 % (ref 39.0–52.0)
Hemoglobin: 14.6 g/dL (ref 13.0–17.0)
MCHC: 33.2 g/dL (ref 30.0–36.0)
MCV: 86.7 fl (ref 78.0–100.0)
Platelets: 300 10*3/uL (ref 150.0–400.0)
RBC: 5.07 Mil/uL (ref 4.22–5.81)
RDW: 13.9 % (ref 11.5–15.5)
WBC: 6.7 10*3/uL (ref 4.0–10.5)

## 2018-08-20 LAB — LIPID PANEL
CHOL/HDL RATIO: 5
Cholesterol: 198 mg/dL (ref 0–200)
HDL: 39.7 mg/dL (ref >39.00)
LDL Cholesterol: 137 mg/dL — ABNORMAL HIGH (ref 0–99)
NonHDL: 158.31
Triglycerides: 107 mg/dL (ref 0.0–149.0)
VLDL: 21.4 mg/dL (ref 0.0–40.0)

## 2018-08-20 LAB — COMPREHENSIVE METABOLIC PANEL
ALT: 17 U/L (ref 0–53)
AST: 15 U/L (ref 0–37)
Albumin: 4 g/dL (ref 3.5–5.2)
Alkaline Phosphatase: 102 U/L (ref 39–117)
BUN: 29 mg/dL — ABNORMAL HIGH (ref 6–23)
CO2: 25 mEq/L (ref 19–32)
CREATININE: 1.87 mg/dL — AB (ref 0.40–1.50)
Calcium: 9.3 mg/dL (ref 8.4–10.5)
Chloride: 101 mEq/L (ref 96–112)
GFR: 42.59 mL/min — ABNORMAL LOW (ref >60.00)
Glucose, Bld: 130 mg/dL — ABNORMAL HIGH (ref 70–99)
Potassium: 5.1 mEq/L (ref 3.5–5.1)
Sodium: 136 mEq/L (ref 135–145)
TOTAL PROTEIN: 6.9 g/dL (ref 6.0–8.3)
Total Bilirubin: 0.4 mg/dL (ref 0.2–1.2)

## 2018-08-20 LAB — TSH: TSH: 1.91 u[IU]/mL (ref 0.35–4.50)

## 2018-08-20 LAB — POCT GLYCOSYLATED HEMOGLOBIN (HGB A1C): Hemoglobin A1C: 7.9 % — AB (ref 4.0–5.6)

## 2018-08-20 MED ORDER — DULAGLUTIDE 1.5 MG/0.5ML ~~LOC~~ SOAJ: 1.5000 mg | SUBCUTANEOUS | 5 refills | Status: DC

## 2018-08-20 MED ORDER — TRIAMCINOLONE ACETONIDE 0.5 % EX OINT: 1.0000 "application " | TOPICAL_OINTMENT | Freq: Two times a day (BID) | CUTANEOUS | 0 refills | Status: DC

## 2018-08-20 MED ORDER — INSULIN DEGLUDEC 100 UNIT/ML ~~LOC~~ SOPN: 22.0000 [IU] | PEN_INJECTOR | Freq: Every day | SUBCUTANEOUS | 5 refills | Status: DC

## 2018-08-20 NOTE — Assessment & Plan Note (Signed)
Check lipid panel.  Continue Crestor 10 mg daily. 

## 2018-08-20 NOTE — Assessment & Plan Note (Signed)
Check C met.

## 2018-08-20 NOTE — Progress Notes (Signed)
   Chief Complaint:  David Ford is a 78 y.o. male who presents today with a chief complaint of T2DM follow up.   Assessment/Plan:  Dermatitis Stable. Will send in triamcinolone per patient request.  Hypertension associated with diabetes ([place]) At goal.  Continue Norvasc 10 mg daily and Coreg 3.125 mg twice daily.  Check CBC and C met.  Hyperlipidemia associated with type 2 diabetes mellitus (HCC) Check lipid panel.  Continue Crestor 10 mg daily.  DM type 2, uncontrolled, with renal complications (HCC) F0Y improved to 7.9.  He has difficulty affording his medications.  We will give him a sample of [place] today.  Recommended starting 22 units daily after he finishes his current supply of Lantus.  We will also give samples for Trulicity 7.74 mg weekly today.  After he finishes his Trulicity samples, will start 1.5 mg weekly.  A prescription for this was sent into his pharmacy.  Advised patient to check with his pharmacy/insurance plan to see cost and coverage.  Check CMET today.  If renal function is stable or has improved, would consider addition of oral medications such as metformin or Jardiance.  Follow-up in 3 to 6 months to recheck A1c, or sooner as needed.    CKD (chronic kidney disease), stage III (HCC) Check C met.  BMI 28 Continue lifestyle modifications.     Subjective:  HPI:  His chronic medical conditions are outlined below:   # T2DM - On lantus 22 units daily, novolog 6 units with the largest meal of the day, and trulicity 0.'75mg'$  weekly -Has difficulty affording Lantus and NovoLog.  He was recently given a sample of Trulicity and is not sure how much it will cost him. - ROS: No reported polyuria or polydipsia  # Essential Hypertension - On norvasc '10mg'$  daily and coreg 3.'125mg'$  twice daily and tolerating well - ROS: No reported chest pain or shortness of breath.   # Dyslipidemia - On lipitor '10mg'$  daily and tolerating well - ROS: No reported myalgias.  #  Dermatitis - Uses topical betamethasone as needed.  ROS: Per HPI  PMH: He reports that he has never smoked. He has never used smokeless tobacco. He reports current alcohol use. He reports that he does not use drugs.      Objective:  Physical Exam: BP 118/66 (BP Location: Left Arm, Patient Position: Sitting, Cuff Size: Large)   Pulse 64   Temp 98 F (36.7 C) (Oral)   Ht '6\' 1"'$  (1.854 m)   Wt 219 lb 4 oz (99.5 kg)   SpO2 97%   BMI 28.93 kg/m   Gen: NAD, resting comfortably CV: Regular rate and rhythm with no murmurs appreciated Pulm: Normal work of breathing, clear to auscultation bilaterally with no crackles, wheezes, or rhonchi  Results for orders placed or performed in visit on 08/20/18 (from the past 24 hour(s))  POCT HgB A1C     Status: Abnormal   Collection Time: 08/20/18  8:17 AM  Result Value Ref Range   Hemoglobin A1C 7.9 (A) 4.0 - 5.6 %         M. Jerline Pain, MD 08/20/2018 9:01 AM

## 2018-08-20 NOTE — Assessment & Plan Note (Signed)
At goal.  Continue Norvasc 10 mg daily and Coreg 3.125 mg twice daily.  Check CBC and C met.

## 2018-08-20 NOTE — Progress Notes (Signed)
Please inform patient of the following:  His kidney function has improved. This will give [place] more options for treatment for his diabetes. Would like for him to continue the invokana as we discussed and we can recheck his A1c in 3 months.  All of his other blood work is STABLE. Would like for him to continue the current doses of all his other medications.  David Ford. Jerline Pain, MD 08/20/2018 2:07 PM

## 2018-08-20 NOTE — Assessment & Plan Note (Addendum)
Stable. Will send in triamcinolone per patient request.

## 2018-08-20 NOTE — Assessment & Plan Note (Signed)
A1c improved to 7.9.  He has difficulty affording his medications.  We will give him a sample of [place] today.  Recommended starting 22 units daily after he finishes his current supply of Lantus.  We will also give samples for Trulicity 2.63 mg weekly today.  After he finishes his Trulicity samples, will start 1.5 mg weekly.  A prescription for this was sent into his pharmacy.  Advised patient to check with his pharmacy/insurance plan to see cost and coverage.  Check CMET today.  If renal function is stable or has improved, would consider addition of oral medications such as metformin or Jardiance.  Follow-up in 3 to 6 months to recheck A1c, or sooner as needed.

## 2018-08-20 NOTE — Patient Instructions (Addendum)
It was very nice to see you today! Medication Samples have been provided to the patient.  Drug name: Trulicity       FQHKUVJD:0.51       Qty: 2  LOT: [id number]  Exp.Date: 06/21  Dosing instructions: Given to patient  The patient has been instructed regarding the correct time, dose, and frequency of taking this medication, including desired effects and most common side effects.  Samples of Tresiba 100 units were given to the patient, quantity 2, Lot Number [id number] [facility] [id number]  Loralyn Freshwater 10:28 AM 08/20/2018   We will switch you from Lantus to [place].  Please continue taking 22 units every morning.  We will give you a sample today.  I will send in a refill to your pharmacy.  Please let us know if this is unaffordable.  We will also give you more samples of Trulicity today.  I will send in a higher dose to your pharmacy.  We will check blood work today.  If your kidney function is improving, we can try other oral medications.  I will refill your triamcinolone today.  Come back to see me in 3 to 6 months to recheck your blood sugar, or sooner as needed.  Take care, Dr Jerline Pain

## 2018-08-27 ENCOUNTER — Other Ambulatory Visit: Payer: Self-pay | Admitting: Family Medicine

## 2018-09-19 ENCOUNTER — Telehealth: Payer: Self-pay | Admitting: Family Medicine

## 2018-09-19 NOTE — Telephone Encounter (Unsigned)
Copied from [place] [id number]. Topic: Quick Communication - See Telephone Encounter >> Sep 19, 2018  2:53 PM David Ford wrote: CRM for notification. See Telephone encounter for: 09/19/18. Patient wants to know if there are samples available for trulicity and flextouch insulin 100units per ml? Says Dr. Jerline Pain told him to call for samples when he ran out.

## 2018-09-19 NOTE — Telephone Encounter (Signed)
See note

## 2018-09-20 NOTE — Telephone Encounter (Signed)
Can we see which insulin he needs?  [name]. Jerline Pain, MD 09/20/2018 4:30 PM

## 2018-09-20 NOTE — Telephone Encounter (Signed)
Please advise 

## 2018-09-21 NOTE — Telephone Encounter (Signed)
Left voice message for patient to call clinic.  

## 2018-09-21 NOTE — Telephone Encounter (Signed)
Patient picked up Rx. He will call insurance to see what is covered.

## 2018-10-13 ENCOUNTER — Other Ambulatory Visit: Payer: Self-pay | Admitting: Family Medicine

## 2018-11-05 ENCOUNTER — Other Ambulatory Visit: Payer: Self-pay | Admitting: Family Medicine

## 2018-11-05 NOTE — Telephone Encounter (Signed)
Last OV 08/20/18

## 2018-11-09 ENCOUNTER — Other Ambulatory Visit: Payer: Self-pay

## 2018-11-09 DIAGNOSIS — IMO0002 Reserved for concepts with insufficient information to code with codable children: Secondary | ICD-10-CM

## 2018-11-09 DIAGNOSIS — E1129 Type 2 diabetes mellitus with other diabetic kidney complication: Secondary | ICD-10-CM

## 2018-11-17 ENCOUNTER — Other Ambulatory Visit: Payer: Self-pay | Admitting: Family Medicine

## 2018-11-21 ENCOUNTER — Other Ambulatory Visit: Payer: Self-pay

## 2018-11-21 ENCOUNTER — Other Ambulatory Visit (INDEPENDENT_AMBULATORY_CARE_PROVIDER_SITE_OTHER): Payer: Medicare PPO

## 2018-11-21 DIAGNOSIS — E1129 Type 2 diabetes mellitus with other diabetic kidney complication: Secondary | ICD-10-CM

## 2018-11-21 DIAGNOSIS — IMO0002 Reserved for concepts with insufficient information to code with codable children: Secondary | ICD-10-CM

## 2018-11-21 DIAGNOSIS — E1165 Type 2 diabetes mellitus with hyperglycemia: Secondary | ICD-10-CM

## 2018-11-21 LAB — HEMOGLOBIN A1C: Hgb A1c MFr Bld: 11.4 % — ABNORMAL HIGH (ref 4.6–6.5)

## 2018-11-21 NOTE — Progress Notes (Signed)
dexa

## 2018-11-21 NOTE — Progress Notes (Signed)
Please inform patient of the following:  A1c is 11.4. Please ask him to schedule OV to discuss treatment plan.   [name]. Jerline Pain, MD 11/21/2018 12:04 PM

## 2018-11-22 ENCOUNTER — Encounter: Payer: Self-pay | Admitting: Family Medicine

## 2018-11-22 ENCOUNTER — Telehealth: Payer: Self-pay | Admitting: Family Medicine

## 2018-11-22 ENCOUNTER — Ambulatory Visit: Payer: Medicare PPO | Admitting: Family Medicine

## 2018-11-22 VITALS — BP 130/82 | HR 65 | Temp 98.2°F | Ht 73.0 in | Wt 213.6 lb

## 2018-11-22 DIAGNOSIS — E1159 Type 2 diabetes mellitus with other circulatory complications: Secondary | ICD-10-CM

## 2018-11-22 DIAGNOSIS — E1169 Type 2 diabetes mellitus with other specified complication: Secondary | ICD-10-CM | POA: Diagnosis not present

## 2018-11-22 DIAGNOSIS — E785 Hyperlipidemia, unspecified: Secondary | ICD-10-CM

## 2018-11-22 DIAGNOSIS — I10 Essential (primary) hypertension: Secondary | ICD-10-CM

## 2018-11-22 DIAGNOSIS — E1129 Type 2 diabetes mellitus with other diabetic kidney complication: Secondary | ICD-10-CM | POA: Diagnosis not present

## 2018-11-22 DIAGNOSIS — IMO0002 Reserved for concepts with insufficient information to code with codable children: Secondary | ICD-10-CM

## 2018-11-22 DIAGNOSIS — E1165 Type 2 diabetes mellitus with hyperglycemia: Secondary | ICD-10-CM | POA: Diagnosis not present

## 2018-11-22 MED ORDER — INSULIN NPH (HUMAN) (ISOPHANE) 100 UNIT/ML ~~LOC~~ SUSP: SUBCUTANEOUS | 11 refills | Status: DC

## 2018-11-22 NOTE — Patient Instructions (Signed)
It was very nice to see you today!  We will switch your insulin to NPH.  This should be much more affordable.  Please use twice daily.  Inject 10 units in the with breakfast and 10 units with  Dinner.  Please continue checking your blood sugar once or twice a day for the next 2 weeks.  Please check and have the next few weeks with your blood sugar readings so that we can adjust your insulin further.  We should recheck your A1c in 91 days.  Take care, Dr Jerline Pain

## 2018-11-22 NOTE — Assessment & Plan Note (Signed)
Last A1c well above goal.  He is not able to afford any injectable medications.  In light of this, we will switch to NPH regimen.  Start NPH insulin 10 units in the morning and 10 units with dinner.  Continue monitoring.  Discussed hypoglycemic warning signs.  He will follow-up in a few weeks with blood sugar readings.  Will need repeat A1c in 91 days.

## 2018-11-22 NOTE — Progress Notes (Signed)
   Chief Complaint:  David Ford is a 78 y.o. male who presents today with a chief complaint of T2DM.   Assessment/Plan:  DM type 2, uncontrolled, with renal complications (HCC) Last A1c well above goal.  He is not able to afford any injectable medications.  In light of this, we will switch to NPH regimen.  Start NPH insulin 10 units in the morning and 10 units with dinner.  Continue monitoring.  Discussed hypoglycemic warning signs.  He will follow-up in a few weeks with blood sugar readings.  Will need repeat A1c in 91 days.  Hypertension associated with diabetes ([place]) At goal.  Continue Norvasc 10 mg daily and Coreg 3.125 mg twice daily.  Hyperlipidemia associated with type 2 diabetes mellitus (HCC) Stable.  Continue atorvastatin 10 mg daily.     Subjective:  HPI:  His stable, chronic medical conditions are outlined below:   # T2DM - Prescribed tresiba 22 units daily, novolog 6 units with the largest meal of the day, and trulicity 0.75mg  weekly.  He has not been able to afford any of his injectable medications due to cost.  Currently taking Amaryl twice daily. -Home blood sugar readings in the upper 200s and low 300s. - ROS: No reported polyuria or polydipsia  # Essential Hypertension - On norvasc 10mg  daily and coreg 3.125mg  twice daily and tolerating well - ROS: No reported chest pain or shortness of breath.   # Dyslipidemia - On lipitor 10mg  daily and tolerating well - ROS: No reported myalgias.  ROS: Per HPI  PMH: He reports that he has never smoked. He has never used smokeless tobacco. He reports current alcohol use. He reports that he does not use drugs.      Objective:  Physical Exam: BP 130/82 (BP Location: Left Arm, Patient Position: Sitting, Cuff Size: Normal)   Pulse 65   Temp 98.2 F (36.8 C) (Oral)   Ht 6\' 1"  (1.854 m)   Wt 213 lb 9.6 oz (96.9 kg)   SpO2 97%   BMI 28.18 kg/m   Gen: NAD, resting comfortably CV: Regular rate and rhythm with no  murmurs appreciated Pulm: Normal work of breathing, clear to auscultation bilaterally with no crackles, wheezes, or rhonchi  Lab Results  Component Value Date   HGBA1C 11.4 (H) 11/21/2018   HGBA1C 7.9 (A) 08/20/2018   HGBA1C 8.0 (A) 05/21/2018   Lab Results  Component Value Date   MICROALBUR 83.6 (H) 10/30/2017   LDLCALC 137 (H) 08/20/2018   CREATININE 1.87 (H) 08/20/2018         Dejai Schubach M. Jerline Pain, MD 11/22/2018 9:12 AM

## 2018-11-22 NOTE — Assessment & Plan Note (Signed)
Stable.  Continue atorvastatin 10mg daily

## 2018-11-22 NOTE — Telephone Encounter (Signed)
See note  Copied from [place] [id number]. Topic: General - Inquiry >> Nov 22, 2018 11:57 AM Virl Axe D wrote: Reason for CRM: Pt stated that his pharmacy called him and stated one of the rx's Dr. Jerline Pain sent in to him was not covered by insurance however the insulin NPH Human (NOVOLIN N) 100 UNIT/ML injection that was sent in is covered by insurance. Pt didn't understand what the pharmacy was talking about. They are supposed to be faxing something over to Dr. Jerline Pain. Please advise.

## 2018-11-22 NOTE — Assessment & Plan Note (Signed)
At goal.  Continue Norvasc 10 mg daily and Coreg 3.125 mg twice daily.

## 2018-11-26 ENCOUNTER — Telehealth: Payer: Self-pay | Admitting: Family Medicine

## 2018-11-26 NOTE — Telephone Encounter (Signed)
Copied from [place] [phone]. Topic: General - Inquiry >> Nov 26, 2018 10:39 AM Lionel December wrote: Reason for CRM: Patient is calling about his medication insulin NPH Human (NOVOLIN N) 100 UNIT/ML injection. The pharmacy needs clarification on with or without needles. He is completely out of this insulin. Patient would like a call back to him and the pharmacy.

## 2018-11-26 NOTE — Telephone Encounter (Signed)
Spoke with pharmacy.Completed

## 2018-11-26 NOTE — Telephone Encounter (Signed)
Spoke with patient and pharmacy.

## 2018-11-26 NOTE — Telephone Encounter (Signed)
See request °

## 2018-11-26 NOTE — Telephone Encounter (Signed)
David Ford, with [facility] mail order pharmacy calling.  States they are trying to get approval for the Novolin N medication. David Ford can be reached at [phone]. Case number: [id number]

## 2018-12-26 ENCOUNTER — Other Ambulatory Visit: Payer: Self-pay | Admitting: Family Medicine

## 2018-12-28 ENCOUNTER — Telehealth: Payer: Self-pay | Admitting: Family Medicine

## 2018-12-28 NOTE — Telephone Encounter (Signed)
LM for patient to return call.  Patient is supposed to be taking 10 units twice daily.  Need clarification on what he is taking.  CRM placed.  Please get clarification and explain this to patient when he returns call.

## 2018-12-28 NOTE — Telephone Encounter (Signed)
Pt called stating he was instructed to inject 10 units of Novolin a day and pt states his Blood sugar was about 280. Pt started to take 15 units a day and his blood sugar is 150-160. Please advise as pt will run out before his refill date.

## 2019-01-02 ENCOUNTER — Other Ambulatory Visit: Payer: Self-pay | Admitting: Family Medicine

## 2019-01-03 NOTE — Telephone Encounter (Signed)
Ok with those ranges. Would like for him to come in for his OV as previously planned - do not need to make any further changes at this time.  [name]. Jerline Pain, MD 01/03/2019 3:48 PM

## 2019-01-03 NOTE — Telephone Encounter (Signed)
Patient stated he is taking 13 units of Novolin twice daily and BS range is now is 150-160. FYI

## 2019-01-09 ENCOUNTER — Other Ambulatory Visit: Payer: Self-pay | Admitting: Family Medicine

## 2019-01-17 ENCOUNTER — Telehealth: Payer: Self-pay | Admitting: Family Medicine

## 2019-01-17 NOTE — Telephone Encounter (Signed)
David Drum Np calling from [employer] [phone]  Calling to report A1C-12.6,  his blood Pressure 140/72 Heart rate-55,   EKG- Wide QRS  Patient stated does not take a long acting insulin. The notes in December stated his was prescription a long acting insulin.  No dizziness, no headaches,  Patient does have blister on left foot. It is near where amputation was.  Saddie stated she will fax notes.

## 2019-01-18 NOTE — Telephone Encounter (Signed)
FYI

## 2019-01-21 NOTE — Telephone Encounter (Signed)
We need to adjust his insulin. Please ask him to schedule an appointment ASAP.  David Ford. Jerline Pain, MD 01/21/2019 7:39 AM

## 2019-01-21 NOTE — Telephone Encounter (Signed)
Please call patient to schedule appointment. Thanks.

## 2019-02-05 ENCOUNTER — Ambulatory Visit: Payer: Medicare PPO | Admitting: Family Medicine

## 2019-02-06 ENCOUNTER — Other Ambulatory Visit: Payer: Self-pay

## 2019-02-06 ENCOUNTER — Ambulatory Visit (INDEPENDENT_AMBULATORY_CARE_PROVIDER_SITE_OTHER): Payer: Medicare PPO | Admitting: Family Medicine

## 2019-02-06 ENCOUNTER — Encounter: Payer: Self-pay | Admitting: Family Medicine

## 2019-02-06 VITALS — BP 132/80 | HR 61 | Temp 98.2°F | Ht 73.0 in | Wt 221.4 lb

## 2019-02-06 DIAGNOSIS — IMO0002 Reserved for concepts with insufficient information to code with codable children: Secondary | ICD-10-CM

## 2019-02-06 DIAGNOSIS — E1159 Type 2 diabetes mellitus with other circulatory complications: Secondary | ICD-10-CM | POA: Diagnosis not present

## 2019-02-06 DIAGNOSIS — E785 Hyperlipidemia, unspecified: Secondary | ICD-10-CM

## 2019-02-06 DIAGNOSIS — J310 Chronic rhinitis: Secondary | ICD-10-CM

## 2019-02-06 DIAGNOSIS — E1169 Type 2 diabetes mellitus with other specified complication: Secondary | ICD-10-CM | POA: Diagnosis not present

## 2019-02-06 DIAGNOSIS — E1129 Type 2 diabetes mellitus with other diabetic kidney complication: Secondary | ICD-10-CM | POA: Diagnosis not present

## 2019-02-06 DIAGNOSIS — L309 Dermatitis, unspecified: Secondary | ICD-10-CM

## 2019-02-06 DIAGNOSIS — I10 Essential (primary) hypertension: Secondary | ICD-10-CM

## 2019-02-06 DIAGNOSIS — E1165 Type 2 diabetes mellitus with hyperglycemia: Secondary | ICD-10-CM | POA: Diagnosis not present

## 2019-02-06 DIAGNOSIS — I152 Hypertension secondary to endocrine disorders: Secondary | ICD-10-CM

## 2019-02-06 LAB — MICROALBUMIN / CREATININE URINE RATIO
Creatinine,U: 71.2 mg/dL
Microalb Creat Ratio: 248.2 mg/g — ABNORMAL HIGH (ref 0.0–30.0)
Microalb, Ur: 176.7 mg/dL — ABNORMAL HIGH (ref 0.0–1.9)

## 2019-02-06 MED ORDER — TRIAMCINOLONE ACETONIDE 0.5 % EX OINT: 1.0000 "application " | TOPICAL_OINTMENT | Freq: Two times a day (BID) | CUTANEOUS | 0 refills | Status: DC

## 2019-02-06 MED ORDER — AZELASTINE HCL 0.1 % NA SOLN: 2.0000 | Freq: Two times a day (BID) | NASAL | 12 refills | Status: DC

## 2019-02-06 MED ORDER — INSULIN GLARGINE 100 UNIT/ML SOLOSTAR PEN: 30.0000 [IU] | PEN_INJECTOR | Freq: Every day | SUBCUTANEOUS | 11 refills | Status: DC

## 2019-02-06 NOTE — Assessment & Plan Note (Signed)
Stable.  Continue atorvastatin 10mg daily

## 2019-02-06 NOTE — Progress Notes (Signed)
   Chief Complaint:  David Ford is a 78 y.o. male who presents today with a chief complaint of T2DM follow up.   Assessment/Plan:  DM type 2, uncontrolled, with renal complications [facility]) Patient recently informed that he would be able to get Lantus cheaply at his pharmacy.  Will start 30 units in the morning.  Will initiate self titration and advised him to increase by 1 unit every morning that his blood sugars more than 125.  Discussed hypoglycemic warning signs.  Follow-up in 3 months to recheck A1c.  Hypertension associated with diabetes ([place]) At goal.  Continue Norvasc 10 mg daily and Coreg 3.125 mg twice daily.  Hyperlipidemia associated with type 2 diabetes mellitus (HCC) Stable.  Continue atorvastatin 10 mg daily.  Dermatitis Triamcinolone refilled.  Rhinitis Astelin refilled.     Subjective:  HPI:  His  chronic medical conditions are outlined below:  # T2DM - On NPH 15 units twice daily - Home sugars in the upper 100s - Recent A1c 12.6 - taken by [facility] - ROS: No reported polyuria or polydipsia  # Essential Hypertension - On norvasc 10mg  daily and coreg 3.125mg  twice daily and tolerating well - ROS: No reported chest pain or shortness of breath.   # Dyslipidemia - On lipitor 10mg  daily and tolerating well - ROS: No reported myalgias.  # Dermatitis - Uses topical triamcinolone as needed.  ROS: Per HPI  PMH: He reports that he has never smoked. He has never used smokeless tobacco. He reports current alcohol use. He reports that he does not use drugs.      Objective:  Physical Exam: BP 132/80 (BP Location: Left Arm, Patient Position: Sitting, Cuff Size: Normal)   Pulse 61   Temp 98.2 F (36.8 C) (Oral)   Ht 6\' 1"  (1.854 m)   Wt 221 lb 6.4 oz (100.4 kg)   SpO2 97%   BMI 29.21 kg/m   Gen: NAD, resting comfortably CV: Regular rate and rhythm with no murmurs appreciated Pulm: Normal work of breathing, clear to auscultation  bilaterally with no crackles, wheezes, or rhonchi MSK: No edema, cyanosis, or clubbing noted Skin: Warm, dry Neuro: Grossly normal, moves all extremities Psych: Normal affect and thought content      Genella Bas M. Jerline Pain, MD 02/06/2019 8:24 AM

## 2019-02-06 NOTE — Patient Instructions (Addendum)
It was very nice to see you today!  We will start Lantus 30 units every morning.  Every morning that your blood sugar is more than 125, please increase by 1 unit.  I would like for your fasting blood sugar to be between 101 125.  I will refill your cream and nasal spray today as well.  We will check a urine sample today.   No other changes today.  Come back to see me in 3 months, or sooner if needed.  Take care, Dr Jerline Pain  Please try these tips to maintain a healthy lifestyle:   Eat at least 3 REAL meals and 1-2 snacks per day.  Aim for no more than 5 hours between eating.  If you eat breakfast, please do so within one hour of getting up.    Obtain twice as many fruits/vegetables as protein or carbohydrate foods for both lunch and dinner. (Half of each meal should be fruits/vegetables, one quarter protein, and one quarter starchy carbs)   Cut down on sweet beverages. This includes juice, soda, and sweet tea.    Exercise at least 150 minutes every week.

## 2019-02-06 NOTE — Assessment & Plan Note (Signed)
Triamcinolone refilled.

## 2019-02-06 NOTE — Assessment & Plan Note (Signed)
At goal.  Continue Norvasc 10 mg daily and Coreg 3.125 mg twice daily. 

## 2019-02-06 NOTE — Assessment & Plan Note (Signed)
Patient recently informed that he would be able to get Lantus cheaply at his pharmacy.  Will start 30 units in the morning.  Will initiate self titration and advised him to increase by 1 unit every morning that his blood sugars more than 125.  Discussed hypoglycemic warning signs.  Follow-up in 3 months to recheck A1c.

## 2019-02-07 NOTE — Progress Notes (Signed)
Please inform patient of the following:  Patient has more protein in urine than last year - we really need to focus on good blood sugar control. We can recheck in a year.  [name]. Jerline Pain, MD 02/07/2019 8:07 AM

## 2019-02-27 ENCOUNTER — Other Ambulatory Visit: Payer: Self-pay | Admitting: Family Medicine

## 2019-03-22 ENCOUNTER — Other Ambulatory Visit: Payer: Self-pay | Admitting: Family Medicine

## 2019-03-27 ENCOUNTER — Other Ambulatory Visit: Payer: Self-pay | Admitting: Family Medicine

## 2019-05-03 ENCOUNTER — Other Ambulatory Visit: Payer: Self-pay | Admitting: Family Medicine

## 2019-05-08 ENCOUNTER — Other Ambulatory Visit: Payer: Self-pay

## 2019-05-09 ENCOUNTER — Ambulatory Visit (INDEPENDENT_AMBULATORY_CARE_PROVIDER_SITE_OTHER): Payer: Medicare PPO | Admitting: Family Medicine

## 2019-05-09 ENCOUNTER — Encounter: Payer: Self-pay | Admitting: Family Medicine

## 2019-05-09 VITALS — BP 142/78 | HR 61 | Temp 97.9°F | Ht 73.0 in | Wt 225.2 lb

## 2019-05-09 DIAGNOSIS — Z23 Encounter for immunization: Secondary | ICD-10-CM | POA: Diagnosis not present

## 2019-05-09 DIAGNOSIS — I152 Hypertension secondary to endocrine disorders: Secondary | ICD-10-CM

## 2019-05-09 DIAGNOSIS — E785 Hyperlipidemia, unspecified: Secondary | ICD-10-CM

## 2019-05-09 DIAGNOSIS — E1169 Type 2 diabetes mellitus with other specified complication: Secondary | ICD-10-CM

## 2019-05-09 DIAGNOSIS — E1159 Type 2 diabetes mellitus with other circulatory complications: Secondary | ICD-10-CM | POA: Diagnosis not present

## 2019-05-09 DIAGNOSIS — I10 Essential (primary) hypertension: Secondary | ICD-10-CM

## 2019-05-09 DIAGNOSIS — E1165 Type 2 diabetes mellitus with hyperglycemia: Secondary | ICD-10-CM | POA: Diagnosis not present

## 2019-05-09 DIAGNOSIS — E1129 Type 2 diabetes mellitus with other diabetic kidney complication: Secondary | ICD-10-CM

## 2019-05-09 DIAGNOSIS — IMO0002 Reserved for concepts with insufficient information to code with codable children: Secondary | ICD-10-CM

## 2019-05-09 LAB — POCT GLYCOSYLATED HEMOGLOBIN (HGB A1C): Hemoglobin A1C: 8.5 % — AB (ref 4.0–5.6)

## 2019-05-09 MED ORDER — AZELASTINE HCL 0.1 % NA SOLN: 2.0000 | Freq: Two times a day (BID) | NASAL | 12 refills | Status: DC

## 2019-05-09 MED ORDER — INSULIN GLARGINE 100 UNIT/ML SOLOSTAR PEN: 35.0000 [IU] | PEN_INJECTOR | Freq: Every day | SUBCUTANEOUS | 11 refills | Status: DC

## 2019-05-09 NOTE — Assessment & Plan Note (Signed)
A1c much improved to 8.5 today.  We will increase Lantus to 35 units daily.  Advised him to stop his glimepiride.  He will follow-up with me in 3 months.

## 2019-05-09 NOTE — Assessment & Plan Note (Signed)
Stable.  Continue Lipitor 10 mg daily.  Need lipid panel with next blood draw.

## 2019-05-09 NOTE — Assessment & Plan Note (Signed)
At goal per JNC 8.  We will continue Norvasc 10 mg daily and Coreg 3.125 mg twice daily.

## 2019-05-09 NOTE — Progress Notes (Signed)
   Chief Complaint:  David Ford is a 78 y.o. male who presents today with a chief complaint of T2DM.   Assessment/Plan:  DM type 2, uncontrolled, with renal complications (HCC) O6Z much improved to 8.5 today.  We will increase Lantus to 35 units daily.  Advised him to stop his glimepiride.  He will follow-up with me in 3 months.  Hypertension associated with diabetes ([place]) At goal per JNC 8.  We will continue Norvasc 10 mg daily and Coreg 3.125 mg twice daily.  Hyperlipidemia associated with type 2 diabetes mellitus (HCC) Stable.  Continue Lipitor 10 mg daily.  Need lipid panel with next blood draw.    Subjective:  HPI:  His stable, chronic medical conditions are outlined below:  # T2DM - On Lantus 32 units daily and tolerating well - Home sugars in low 100s - ROS: No reported polyuria or polydipsia  # Essential Hypertension - On norvasc 10mg  daily and coreg 3.125mg  twice daily and tolerating well - ROS: No reported chest Ford or shortness of breath.   # Dyslipidemia - On lipitor 10mg  daily and tolerating well - ROS: No reported myalgias.   ROS: Per HPI  PMH: He reports that he has never smoked. He has never used smokeless tobacco. He reports current alcohol use. He reports that he does not use drugs.      Objective:  Physical Exam: BP (!) 142/78   Pulse 61   Temp 97.9 F (36.6 C)   Ht 6\' 1"  (1.854 m)   Wt 225 lb 4 oz (102.2 kg)   SpO2 98%   BMI 29.72 kg/m   Wt Readings from Last 3 Encounters:  05/09/19 225 lb 4 oz (102.2 kg)  02/06/19 221 lb 6.4 oz (100.4 kg)  11/22/18 213 lb 9.6 oz (96.9 kg)  Gen: NAD, resting comfortably CV: Regular rate and rhythm with no murmurs appreciated Pulm: Normal work of breathing, clear to auscultation bilaterally with no crackles, wheezes, or rhonchi GI: Normal bowel sounds present. Soft, Nontender, Nondistended. MSK: No edema, cyanosis, or clubbing noted Skin: Warm, dry Neuro: Grossly normal, moves all extremities Psych:  Normal affect and thought content  Results for orders placed or performed in visit on 05/09/19 (from the past 24 hour(s))  POCT HgB A1C     Status: Abnormal   Collection Time: 05/09/19  8:25 AM  Result Value Ref Range   Hemoglobin A1C 8.5 (A) 4.0 - 5.6 %         M. David Pain, MD 05/09/2019 8:58 AM

## 2019-05-09 NOTE — Patient Instructions (Signed)
It was very nice to see you today!  STOP the glimepiride.  Increase your Lantus to 35 units daily.  I will refill your nasal spray today.  We will give you your flu vaccine today.  Come back to see me in 3 to 6 months, or sooner if needed.  Take care, Dr Jerline Pain  Please try these tips to maintain a healthy lifestyle:   Eat at least 3 REAL meals and 1-2 snacks per day.  Aim for no more than 5 hours between eating.  If you eat breakfast, please do so within one hour of getting up.    Obtain twice as many fruits/vegetables as protein or carbohydrate foods for both lunch and dinner. (Half of each meal should be fruits/vegetables, one quarter protein, and one quarter starchy carbs)   Cut down on sweet beverages. This includes juice, soda, and sweet tea.    Exercise at least 150 minutes every week.

## 2019-05-11 ENCOUNTER — Other Ambulatory Visit: Payer: Self-pay | Admitting: Family Medicine

## 2019-05-28 ENCOUNTER — Other Ambulatory Visit: Payer: Self-pay | Admitting: Family Medicine

## 2019-06-06 ENCOUNTER — Other Ambulatory Visit: Payer: Self-pay | Admitting: Family Medicine

## 2019-06-12 ENCOUNTER — Other Ambulatory Visit: Payer: Self-pay

## 2019-06-19 ENCOUNTER — Other Ambulatory Visit: Payer: Self-pay

## 2019-06-24 ENCOUNTER — Other Ambulatory Visit: Payer: Self-pay | Admitting: Family Medicine

## 2019-07-16 ENCOUNTER — Telehealth: Payer: Self-pay | Admitting: Family Medicine

## 2019-07-16 NOTE — Telephone Encounter (Signed)
Pt misplaced insulin and is in need of more. Please advise.

## 2019-07-17 ENCOUNTER — Telehealth: Payer: Self-pay

## 2019-07-17 ENCOUNTER — Other Ambulatory Visit: Payer: Self-pay

## 2019-07-17 MED ORDER — INSULIN GLARGINE 100 UNIT/ML SOLOSTAR PEN: 35.0000 [IU] | PEN_INJECTOR | Freq: Every day | SUBCUTANEOUS | 1 refills | Status: DC

## 2019-07-17 NOTE — Telephone Encounter (Signed)
Daughter called to have her father medication refill at pharmacy in [place]. Daughter do not know that name of the medication that her father needs. Daughter states that he will be in [place] for 3 days. Daughter states that he need his diabetes medicine and Daughter can be reached at [phone]. Requesting to have this filled [facility] [place] [address] phone number [phone]

## 2019-07-17 NOTE — Telephone Encounter (Signed)
Patients wife calling in to gather medication  Information. Advised wife that I did send over request to doctor. Provided instructions for the patient to download my chart app to find out what prescription is need for the pharmacy. Patient wife stated if he do not get medicine in the next hour he's going to pass out.

## 2019-07-17 NOTE — Telephone Encounter (Signed)
ME 

## 2019-07-17 NOTE — Telephone Encounter (Signed)
Rx sent 

## 2019-07-19 ENCOUNTER — Other Ambulatory Visit: Payer: Self-pay | Admitting: Family Medicine

## 2019-08-05 ENCOUNTER — Other Ambulatory Visit: Payer: Self-pay | Admitting: Family Medicine

## 2019-08-09 ENCOUNTER — Encounter: Payer: Self-pay | Admitting: Family Medicine

## 2019-08-09 ENCOUNTER — Ambulatory Visit (INDEPENDENT_AMBULATORY_CARE_PROVIDER_SITE_OTHER): Payer: Medicare PPO | Admitting: Family Medicine

## 2019-08-09 ENCOUNTER — Other Ambulatory Visit: Payer: Self-pay

## 2019-08-09 ENCOUNTER — Ambulatory Visit: Payer: Medicare PPO

## 2019-08-09 VITALS — BP 149/68 | HR 61 | Temp 97.3°F | Ht 73.0 in | Wt 222.2 lb

## 2019-08-09 DIAGNOSIS — E1165 Type 2 diabetes mellitus with hyperglycemia: Secondary | ICD-10-CM

## 2019-08-09 DIAGNOSIS — E1159 Type 2 diabetes mellitus with other circulatory complications: Secondary | ICD-10-CM

## 2019-08-09 DIAGNOSIS — I10 Essential (primary) hypertension: Secondary | ICD-10-CM | POA: Diagnosis not present

## 2019-08-09 DIAGNOSIS — IMO0002 Reserved for concepts with insufficient information to code with codable children: Secondary | ICD-10-CM

## 2019-08-09 DIAGNOSIS — E1129 Type 2 diabetes mellitus with other diabetic kidney complication: Secondary | ICD-10-CM

## 2019-08-09 DIAGNOSIS — I152 Hypertension secondary to endocrine disorders: Secondary | ICD-10-CM

## 2019-08-09 LAB — POCT GLYCOSYLATED HEMOGLOBIN (HGB A1C): Hemoglobin A1C: 8.2 % — AB (ref 4.0–5.6)

## 2019-08-09 NOTE — Assessment & Plan Note (Signed)
At goal per JNC 8.  Continue Norvasc 10 mg daily and Coreg 3.125 mg twice daily.

## 2019-08-09 NOTE — Patient Instructions (Signed)
It was very nice to see you today!  Keep up the good work!  Please take 35 units of Lantus daily.  Come back in 3 months for your annual physical with blood work.  Come back sooner if needed  Take care, Dr Jerline Pain  Please try these tips to maintain a healthy lifestyle:   Eat at least 3 REAL meals and 1-2 snacks per day.  Aim for no more than 5 hours between eating.  If you eat breakfast, please do so within one hour of getting up.    Each meal should contain half fruits/vegetables, one quarter protein, and one quarter carbs (no bigger than a computer mouse)   Cut down on sweet beverages. This includes juice, soda, and sweet tea.     Drink at least 1 glass of water with each meal and aim for at least 8 glasses per day   Exercise at least 150 minutes every week.

## 2019-08-09 NOTE — Progress Notes (Signed)
   David Ford is a 79 y.o. male who presents today for an office visit.  Assessment/Plan:  Chronic Problems Addressed Today: DM type 2, uncontrolled, with renal complications (HCC) S2M improved to 8.2 however still above goal.  We will increase Lantus to 35 units daily.  He will continue working on diet and exercise as well.  He will follow-up with me in 3 months for CPE and we will recheck A1c at that time.  Hypertension associated with diabetes ([place]) At goal per JNC 8.  Continue Norvasc 10 mg daily and Coreg 3.125 mg twice daily.    Subjective:  HPI:  Patient is here today for follow-up visit.  He has been doing well since our last visit about 3 months ago.  Sugars have been in the low 200s to upper 100s range.  Is been taking 30 units of Lantus daily and tolerating well.       Objective:  Physical Exam: BP (!) 149/68   Pulse 61   Temp (!) 97.3 F (36.3 C)   Ht 6\' 1"  (1.854 m)   Wt 222 lb 3.2 oz (100.8 kg)   SpO2 98%   BMI 29.32 kg/m   Gen: No acute distress, resting comfortably CV: Regular rate and rhythm with no murmurs appreciated Pulm: Normal work of breathing, clear to auscultation bilaterally with no crackles, wheezes, or rhonchi Neuro: Grossly normal, moves all extremities Psych: Normal affect and thought content      Toma Arts M. Jerline Pain, MD 08/09/2019 10:35 AM

## 2019-08-09 NOTE — Assessment & Plan Note (Signed)
A1c improved to 8.2 however still above goal.  We will increase Lantus to 35 units daily.  He will continue working on diet and exercise as well.  He will follow-up with me in 3 months for CPE and we will recheck A1c at that time.

## 2019-08-12 ENCOUNTER — Ambulatory Visit (INDEPENDENT_AMBULATORY_CARE_PROVIDER_SITE_OTHER): Payer: Medicare PPO

## 2019-08-12 ENCOUNTER — Other Ambulatory Visit: Payer: Self-pay | Admitting: Family Medicine

## 2019-08-12 ENCOUNTER — Other Ambulatory Visit: Payer: Self-pay

## 2019-08-12 DIAGNOSIS — Z0000 Encounter for general adult medical examination without abnormal findings: Secondary | ICD-10-CM | POA: Diagnosis not present

## 2019-08-12 NOTE — Patient Instructions (Signed)
David Ford , Thank you for taking time to come for your Medicare Wellness Visit. I appreciate your ongoing commitment to your health goals. Please review the following plan we discussed and let me know if I can assist you in the future.   Screening recommendations/referrals: Colorectal Screening: up to date; last colonoscopy 05/04/15  Vision and Dental Exams: Recommended annual ophthalmology exams for early detection of glaucoma and other disorders of the eye Recommended annual dental exams for proper oral hygiene  Diabetic Exams: Diabetic Eye Exam: recommended yearly; I will request records from Dr. Coralyn Pear  Diabetic Foot Exam: recommended yearly   Vaccinations: Influenza vaccine: completed 05/09/19 Pneumococcal vaccine: up to date; last 09/12/17  Tdap vaccine: recommended every 10 years;  Please call your insurance company to determine your out of pocket expense. You may also receive this vaccine at your local pharmacy or Health Dept. Shingles vaccine: Please call your insurance company to determine your out of pocket expense for the Shingrix vaccine. You may receive this vaccine at your local pharmacy. (see handout)   Advanced directives: Please bring a copy of your POA (Power of Attorney) and/or Living Will to your next appointment.  Goals: Recommend to drink at least 6-8 8oz glasses of water per day and consume a balanced diet rich in fresh fruits and vegetables.   Next appointment: Please schedule your Annual Wellness Visit with your Nurse Health Advisor in one year.  Preventive Care 60 Years and Older, Male Preventive care refers to lifestyle choices and visits with your health care provider that can promote health and wellness. What does preventive care include?  A yearly physical exam. This is also called an annual well check.  Dental exams once or twice a year.  Routine eye exams. Ask your health care provider how often you should have your eyes checked.  Personal lifestyle  choices, including:  Daily care of your teeth and gums.  Regular physical activity.  Eating a healthy diet.  Avoiding tobacco and drug use.  Limiting alcohol use.  Practicing safe sex.  Taking low doses of aspirin every day if recommended by your health care provider..  Taking vitamin and mineral supplements as recommended by your health care provider. What happens during an annual well check? The services and screenings done by your health care provider during your annual well check will depend on your age, overall health, lifestyle risk factors, and family history of disease. Counseling  Your health care provider may ask you questions about your:  Alcohol use.  Tobacco use.  Drug use.  Emotional well-being.  Home and relationship well-being.  Sexual activity.  Eating habits.  History of falls.  Memory and ability to understand (cognition).  Work and work Statistician. Screening  You may have the following tests or measurements:  Height, weight, and BMI.  Blood pressure.  Lipid and cholesterol levels. These may be checked every 5 years, or more frequently if you are over 5 years old.  Skin check.  Lung cancer screening. You may have this screening every year starting at age 38 if you have a 30-pack-year history of smoking and currently smoke or have quit within the past 15 years.  Fecal occult blood test (FOBT) of the stool. You may have this test every year starting at age 45.  Flexible sigmoidoscopy or colonoscopy. You may have a sigmoidoscopy every 5 years or a colonoscopy every 10 years starting at age 70.  Prostate cancer screening. Recommendations will vary depending on your family history and  other risks.  Hepatitis C blood test.  Hepatitis B blood test.  Sexually transmitted disease (STD) testing.  Diabetes screening. This is done by checking your blood sugar (glucose) after you have not eaten for a while (fasting). You may have this done  every 1-3 years.  Abdominal aortic aneurysm (AAA) screening. You may need this if you are a current or former smoker.  Osteoporosis. You may be screened starting at age 55 if you are at high risk. Talk with your health care provider about your test results, treatment options, and if necessary, the need for more tests. Vaccines  Your health care provider may recommend certain vaccines, such as:  Influenza vaccine. This is recommended every year.  Tetanus, diphtheria, and acellular pertussis (Tdap, Td) vaccine. You may need a Td booster every 10 years.  Zoster vaccine. You may need this after age 16.  Pneumococcal 13-valent conjugate (PCV13) vaccine. One dose is recommended after age 52.  Pneumococcal polysaccharide (PPSV23) vaccine. One dose is recommended after age 18. Talk to your health care provider about which screenings and vaccines you need and how often you need them. This information is not intended to replace advice given to you by your health care provider. Make sure you discuss any questions you have with your health care provider. Document Released: 07/03/2015 Document Revised: 02/24/2016 Document Reviewed: 04/07/2015 Elsevier Interactive Patient Education  2017 [employer] Prevention in the Home Falls can cause injuries. They can happen to people of all ages. There are many things you can do to make your home safe and to help prevent falls. What can I do on the outside of my home?  Regularly fix the edges of walkways and driveways and fix any cracks.  Remove anything that might make you trip as you walk through a door, such as a raised step or threshold.  Trim any bushes or trees on the path to your home.  Use bright outdoor lighting.  Clear any walking paths of anything that might make someone trip, such as rocks or tools.  Regularly check to see if handrails are loose or broken. Make sure that both sides of any steps have handrails.  Any raised decks and  porches should have guardrails on the edges.  Have any leaves, snow, or ice cleared regularly.  Use sand or salt on walking paths during winter.  Clean up any spills in your garage right away. This includes oil or grease spills. What can I do in the bathroom?  Use night lights.  Install grab bars by the toilet and in the tub and shower. Do not use towel bars as grab bars.  Use non-skid mats or decals in the tub or shower.  If you need to sit down in the shower, use a plastic, non-slip stool.  Keep the floor dry. Clean up any water that spills on the floor as soon as it happens.  Remove soap buildup in the tub or shower regularly.  Attach bath mats securely with double-sided non-slip rug tape.  Do not have throw rugs and other things on the floor that can make you trip. What can I do in the bedroom?  Use night lights.  Make sure that you have a light by your bed that is easy to reach.  Do not use any sheets or blankets that are too big for your bed. They should not hang down onto the floor.  Have a firm chair that has side arms. You can use this for  support while you get dressed.  Do not have throw rugs and other things on the floor that can make you trip. What can I do in the kitchen?  Clean up any spills right away.  Avoid walking on wet floors.  Keep items that you use a lot in easy-to-reach places.  If you need to reach something above you, use a strong step stool that has a grab bar.  Keep electrical cords out of the way.  Do not use floor polish or wax that makes floors slippery. If you must use wax, use non-skid floor wax.  Do not have throw rugs and other things on the floor that can make you trip. What can I do with my stairs?  Do not leave any items on the stairs.  Make sure that there are handrails on both sides of the stairs and use them. Fix handrails that are broken or loose. Make sure that handrails are as long as the stairways.  Check any  carpeting to make sure that it is firmly attached to the stairs. Fix any carpet that is loose or worn.  Avoid having throw rugs at the top or bottom of the stairs. If you do have throw rugs, attach them to the floor with carpet tape.  Make sure that you have a light switch at the top of the stairs and the bottom of the stairs. If you do not have them, ask someone to add them for you. What else can I do to help prevent falls?  Wear shoes that:  Do not have high heels.  Have rubber bottoms.  Are comfortable and fit you well.  Are closed at the toe. Do not wear sandals.  If you use a stepladder:  Make sure that it is fully opened. Do not climb a closed stepladder.  Make sure that both sides of the stepladder are locked into place.  Ask someone to hold it for you, if possible.  Clearly mark and make sure that you can see:  Any grab bars or handrails.  First and last steps.  Where the edge of each step is.  Use tools that help you move around (mobility aids) if they are needed. These include:  Canes.  Walkers.  Scooters.  Crutches.  Turn on the lights when you go into a dark area. Replace any light bulbs as soon as they burn out.  Set up your furniture so you have a clear path. Avoid moving your furniture around.  If any of your floors are uneven, fix them.  If there are any pets around you, be aware of where they are.  Review your medicines with your doctor. Some medicines can make you feel dizzy. This can increase your chance of falling. Ask your doctor what other things that you can do to help prevent falls. This information is not intended to replace advice given to you by your health care provider. Make sure you discuss any questions you have with your health care provider. Document Released: 04/02/2009 Document Revised: 11/12/2015 Document Reviewed: 07/11/2014 Elsevier Interactive Patient Education  2017 [employer].

## 2019-08-12 NOTE — Progress Notes (Signed)
This visit is being conducted via phone call due to the COVID-19 pandemic. This patient has given me verbal consent via phone to conduct this visit, patient states they are participating from their home address. Some vital signs may be absent or patient reported.   Patient identification: identified by name, DOB, and current address.  Location provider: [facility], Office Persons participating in the virtual visit: Denman George LPN, patient, and Dr.     Donnie Ford:   David Ford is a 79 y.o. male who presents for an Initial Medicare Annual Wellness Visit.  Review of Systems   Cardiac Risk Factors include: advanced age (>28mn, >>68women);male gender;dyslipidemia;hypertension;diabetes mellitus   Objective:    There were no vitals filed for this visit. There is no height or weight on file to calculate BMI.  Advanced Directives 08/12/2019 07/30/2017 07/30/2017 06/17/2015  Does Patient Have a Medical Advance Directive? Yes No No No  Type of Advance Directive [place] Does patient want to make changes to medical advance directive? No - Patient declined - - -  Copy of [place] Chart? No - copy requested - - -  Would patient like information on creating a medical advance directive? - No - Patient declined No - Patient declined No - patient declined information    Current Medications (verified) Outpatient Encounter Medications as of 08/12/2019  Medication Sig  . amLODipine (NORVASC) 10 MG tablet TAKE 1 TABLET EVERY DAY  . atorvastatin (LIPITOR) 10 MG tablet TAKE 1 TABLET EVERY DAY  . azelastine (ASTELIN) 0.1 % nasal spray Place 2 sprays into both nostrils 2 (two) times daily.  . BD PEN NEEDLE NANO U/F 32G X 4 MM MISC USE WITH PEN INJECTOR DAILY  . Blood Glucose Monitoring Suppl (TRUE METRIX AIR GLUCOSE METER) w/Device KIT   . brimonidine (ALPHAGAN) 0.2 % ophthalmic solution   . carvedilol (COREG) 3.125 MG tablet TAKE 1 TABLET TWICE  DAILY WITH MEALS  . dorzolamide-timolol (COSOPT) 22.3-6.8 MG/ML ophthalmic solution   . fluticasone (FLONASE) 50 MCG/ACT nasal spray Place 2 sprays into both nostrils daily.  . Insulin Glargine (LANTUS) 100 UNIT/ML Solostar Pen Inject 35-50 Units into the skin daily.  . Insulin Pen Needle (PEN NEEDLES) 32G X 5 MM MISC 1 application by Does not apply route 4 (four) times daily as needed (to inject insulin).  .Marland Kitchenlatanoprost (XALATAN) 0.005 % ophthalmic solution INSTILL 1 DROP INTO BOTH EYES AT BEDTIME  . tamsulosin (FLOMAX) 0.4 MG CAPS capsule TAKE 1 CAPSULE EVERY DAY  . traZODone (DESYREL) 150 MG tablet TAKE 2 TABLETS AT BEDTIME  . triamcinolone ointment (KENALOG) 0.5 % APPLY TOPICALLY 2 TIMES DAILY  . TRUE METRIX BLOOD GLUCOSE TEST test strip CHECK BLOOD SUGAR FOUR TIMES DAILY  AND AS NEEDED  . TRUEplus Lancets 33G MISC CHECK BLOOD SUGAR FOUR TIMES DAILY  AND AS NEEDED  . Vitamin D, Cholecalciferol, 1000 units CAPS Take 1,000 Units by mouth daily.   No facility-administered encounter medications on file as of 08/12/2019.    Allergies (verified) Patient has no known allergies.   History: Past Medical History:  Diagnosis Date  . AKI (acute kidney injury) ([facility] 06/18/2015  . Cancer (HCC)    hx of prostate cancer  . Chronic kidney disease   . Diabetes mellitus without complication ([facility]   . DKA (diabetic ketoacidoses) ([facility] 06/18/2015  . Glaucoma   . Hyperlipidemia   . Hypertension    Past Surgical History:  Procedure  Laterality Date  . AMPUTATION Left 07/03/2015   Procedure: Left Foot 1st and 2nd Ray Amputation;  Surgeon: Newt Minion, MD;  Location: [facility];  Service: Orthopedics;  Laterality: Left;  . BASCILIC VEIN TRANSPOSITION Left 07/14/2015   Procedure: BRACHIOBASILIC VEIN TRANSPOSITION  ;  Surgeon: Elam Dutch, MD;  Location: [facility];  Service: Vascular;  Laterality: Left;  . IRIDOTOMY / IRIDECTOMY    . prostate seeds     Family History  Problem Relation Age of Onset  .  Diabetes Mother   . Hypertension Mother   . Stroke Mother   . Diabetes Sister   . Glaucoma Sister   . Diabetes Brother   . Glaucoma Brother   . Amblyopia Neg Hx   . Blindness Neg Hx   . Cataracts Neg Hx   . Macular degeneration Neg Hx   . Retinal detachment Neg Hx   . Strabismus Neg Hx   . Retinitis pigmentosa Neg Hx    Social History   Socioeconomic History  . Marital status: Divorced    Spouse name: Not on file  . Number of children: Not on file  . Years of education: Not on file  . Highest education level: Not on file  Occupational History  . Not on file  Tobacco Use  . Smoking status: Never Smoker  . Smokeless tobacco: Never Used  Substance and Sexual Activity  . Alcohol use: Yes    Comment: once a week  . Drug use: No  . Sexual activity: Not on file  Other Topics Concern  . Not on file  Social History Narrative  . Not on file   Social Determinants of Health   Financial Resource Strain:   . Difficulty of Paying Living Expenses: Not on file  Food Insecurity:   . Worried About Charity fundraiser in the Last Year: Not on file  . Ran Out of Food in the Last Year: Not on file  Transportation Needs:   . Lack of Transportation (Medical): Not on file  . Lack of Transportation (Non-Medical): Not on file  Physical Activity:   . Days of Exercise per Week: Not on file  . Minutes of Exercise per Session: Not on file  Stress:   . Feeling of Stress : Not on file  Social Connections:   . Frequency of Communication with Friends and Family: Not on file  . Frequency of Social Gatherings with Friends and Family: Not on file  . Attends Religious Services: Not on file  . Active Member of Clubs or Organizations: Not on file  . Attends Archivist Meetings: Not on file  . Marital Status: Not on file   Tobacco Counseling Counseling given: Not Answered   Clinical Intake:  Pre-visit preparation completed: Yes  Pain : No/denies pain  Diabetes: Yes CBG done?:  Yes(patient reports as 104- fasting) CBG resulted in Enter/ Edit results?: No Did pt. bring in CBG monitor from home?: No  How often do you need to have someone help you when you read instructions, pamphlets, or other written materials from your doctor or pharmacy?: 1 - Never  Interpreter Needed?: No  Information entered by :: Denman George LPN  Activities of Daily Living In your present state of health, do you have any difficulty performing the following activities: 08/12/2019  Hearing? N  Vision? N  Difficulty concentrating or making decisions? N  Walking or climbing stairs? N  Dressing or bathing? N  Doing errands, shopping? N  Some recent data might be hidden     Immunizations and Health Maintenance Immunization History  Administered Date(s) Administered  . Fluad Quad(high Dose 65+) 05/09/2019  . Influenza-Unspecified 04/24/2014, 03/04/2016, 05/18/2018  . Pneumococcal Conjugate-13 04/24/2014  . Pneumococcal Polysaccharide-23 09/12/2017   Health Maintenance Due  Topic Date Due  . FOOT EXAM  09/05/2018  . OPHTHALMOLOGY EXAM  04/06/2019    Patient Care Team: [name], MD as PCP - General (Family Medicine) Bernarda Caffey, MD as Consulting Physician (Ophthalmology)  Indicate any recent Medical Services you may have received from other than [facility] providers in the past year (date may be approximate).    Assessment:   This is a routine wellness examination for [place].  Hearing/Vision screen No exam data present  Dietary issues and exercise activities discussed: Current Exercise Habits: The patient does not participate in regular exercise at present  Goals   None    Depression Screen PHQ 2/9 Scores 08/12/2019 08/09/2019 05/09/2019 05/21/2018  PHQ - 2 Score 0 0 0 0    Fall Risk Fall Risk  08/12/2019 08/09/2019 05/09/2019 01/31/2018 08/31/2017  Falls in the past year? 0 0 0 No No  Number falls in past yr: 0 - - - -  Injury with Fall? 0 - - - -  Follow up Falls  evaluation completed;Education provided;Falls prevention discussed - - - -    Is the patient's home free of loose throw rugs in walkways, pet beds, electrical cords, etc?   yes      Grab bars in the bathroom? yes      Handrails on the stairs?   yes      Adequate lighting?   yes   Cognitive Function: no cognitive concerns at this time      6CIT Screen 08/12/2019  What Year? 0 points  What month? 0 points  What time? 0 points  Count back from 20 0 points  Months in reverse 0 points  Repeat phrase 2 points  Total Score 2    Screening Tests Health Maintenance  Topic Date Due  . FOOT EXAM  09/05/2018  . OPHTHALMOLOGY EXAM  04/06/2019  . TETANUS/TDAP  05/08/2020 (Originally 10/03/1959)  . HEMOGLOBIN A1C  02/06/2020  . URINE MICROALBUMIN  02/06/2020  . INFLUENZA VACCINE  Completed  . PNA vac Low Risk Adult  Completed    Qualifies for Shingles Vaccine? Discussed and patient will check with pharmacy for coverage.  Patient education handout provided    Cancer Screenings: Lung: Low Dose CT Chest recommended if Age 54-80 years, 30 pack-year currently smoking OR have quit w/in 15years. Patient does not qualify. Colorectal: colonoscopy 05/04/15     Plan:  I have personally reviewed and addressed the Medicare Annual Wellness questionnaire and have noted the following in the patient's chart:  A. Medical and social history B. Use of alcohol, tobacco or illicit drugs  C. Current medications and supplements D. Functional ability and status E.  Nutritional status F.  Physical activity G. Advance directives H. List of other physicians I.  Hospitalizations, surgeries, and ER visits in previous 12 months J.  [place] such as hearing and vision if needed, cognitive and depression L. Referrals, records requested, and appointments- will request diabetic eye exam notes   In addition, I have reviewed and discussed with patient certain preventive protocols, quality metrics, and  best practice recommendations. A written personalized care plan for preventive services as well as general preventive health recommendations were provided to patient.  Signed,  Denman George, LPN  Nurse Health Advisor   Nurse Notes: Patient added to Covid vaccine wait list per his request

## 2019-09-04 ENCOUNTER — Other Ambulatory Visit: Payer: Self-pay | Admitting: Family Medicine

## 2019-09-18 ENCOUNTER — Other Ambulatory Visit: Payer: Self-pay | Admitting: Family Medicine

## 2019-09-25 ENCOUNTER — Other Ambulatory Visit: Payer: Self-pay | Admitting: Family Medicine

## 2019-10-04 ENCOUNTER — Other Ambulatory Visit: Payer: Self-pay | Admitting: Family Medicine

## 2019-10-09 ENCOUNTER — Other Ambulatory Visit: Payer: Self-pay | Admitting: *Deleted

## 2019-10-09 MED ORDER — TRUE METRIX BLOOD GLUCOSE TEST VI STRP: ORAL_STRIP | 3 refills | Status: DC

## 2019-10-21 ENCOUNTER — Other Ambulatory Visit: Payer: Self-pay | Admitting: Family Medicine

## 2019-11-11 ENCOUNTER — Other Ambulatory Visit: Payer: Self-pay | Admitting: Family Medicine

## 2019-11-11 ENCOUNTER — Other Ambulatory Visit: Payer: Self-pay | Admitting: *Deleted

## 2019-12-04 ENCOUNTER — Other Ambulatory Visit: Payer: Self-pay | Admitting: Family Medicine

## 2019-12-23 ENCOUNTER — Other Ambulatory Visit: Payer: Self-pay | Admitting: Family Medicine

## 2019-12-27 ENCOUNTER — Other Ambulatory Visit: Payer: Self-pay | Admitting: Family Medicine

## 2020-01-02 ENCOUNTER — Other Ambulatory Visit: Payer: Self-pay | Admitting: Family Medicine

## 2020-01-05 ENCOUNTER — Emergency Department (HOSPITAL_COMMUNITY): Payer: Medicare HMO

## 2020-01-05 ENCOUNTER — Encounter (HOSPITAL_COMMUNITY): Payer: Self-pay | Admitting: Emergency Medicine

## 2020-01-05 ENCOUNTER — Emergency Department (HOSPITAL_COMMUNITY)
Admission: EM | Admit: 2020-01-05 | Discharge: 2020-01-05 | Disposition: A | Discharge status: Home / Self Care | Payer: Medicare HMO | Source: Home / Self Care | Attending: Emergency Medicine | Admitting: Emergency Medicine

## 2020-01-05 ENCOUNTER — Other Ambulatory Visit: Payer: Self-pay

## 2020-01-05 DIAGNOSIS — K920 Hematemesis: Secondary | ICD-10-CM

## 2020-01-05 DIAGNOSIS — E11649 Type 2 diabetes mellitus with hypoglycemia without coma: Secondary | ICD-10-CM | POA: Diagnosis present

## 2020-01-05 DIAGNOSIS — Z8546 Personal history of malignant neoplasm of prostate: Secondary | ICD-10-CM | POA: Diagnosis not present

## 2020-01-05 DIAGNOSIS — I447 Left bundle-branch block, unspecified: Secondary | ICD-10-CM | POA: Diagnosis not present

## 2020-01-05 DIAGNOSIS — E162 Hypoglycemia, unspecified: Secondary | ICD-10-CM | POA: Diagnosis present

## 2020-01-05 DIAGNOSIS — Z794 Long term (current) use of insulin: Secondary | ICD-10-CM | POA: Diagnosis not present

## 2020-01-05 DIAGNOSIS — E1165 Type 2 diabetes mellitus with hyperglycemia: Secondary | ICD-10-CM | POA: Diagnosis present

## 2020-01-05 DIAGNOSIS — E161 Other hypoglycemia: Secondary | ICD-10-CM | POA: Diagnosis not present

## 2020-01-05 DIAGNOSIS — I6782 Cerebral ischemia: Secondary | ICD-10-CM | POA: Diagnosis not present

## 2020-01-05 DIAGNOSIS — E1159 Type 2 diabetes mellitus with other circulatory complications: Secondary | ICD-10-CM | POA: Diagnosis not present

## 2020-01-05 DIAGNOSIS — R410 Disorientation, unspecified: Secondary | ICD-10-CM | POA: Insufficient documentation

## 2020-01-05 DIAGNOSIS — N183 Chronic kidney disease, stage 3 unspecified: Secondary | ICD-10-CM | POA: Diagnosis not present

## 2020-01-05 DIAGNOSIS — J341 Cyst and mucocele of nose and nasal sinus: Secondary | ICD-10-CM | POA: Diagnosis not present

## 2020-01-05 DIAGNOSIS — R404 Transient alteration of awareness: Secondary | ICD-10-CM | POA: Diagnosis not present

## 2020-01-05 DIAGNOSIS — R4020 Unspecified coma: Secondary | ICD-10-CM | POA: Diagnosis not present

## 2020-01-05 DIAGNOSIS — N179 Acute kidney failure, unspecified: Secondary | ICD-10-CM | POA: Diagnosis present

## 2020-01-05 DIAGNOSIS — K209 Esophagitis, unspecified without bleeding: Secondary | ICD-10-CM | POA: Diagnosis present

## 2020-01-05 DIAGNOSIS — Z8249 Family history of ischemic heart disease and other diseases of the circulatory system: Secondary | ICD-10-CM | POA: Diagnosis not present

## 2020-01-05 DIAGNOSIS — R42 Dizziness and giddiness: Secondary | ICD-10-CM | POA: Insufficient documentation

## 2020-01-05 DIAGNOSIS — I129 Hypertensive chronic kidney disease with stage 1 through stage 4 chronic kidney disease, or unspecified chronic kidney disease: Secondary | ICD-10-CM | POA: Diagnosis present

## 2020-01-05 DIAGNOSIS — E860 Dehydration: Secondary | ICD-10-CM | POA: Diagnosis present

## 2020-01-05 DIAGNOSIS — Z20822 Contact with and (suspected) exposure to covid-19: Secondary | ICD-10-CM | POA: Diagnosis present

## 2020-01-05 DIAGNOSIS — G319 Degenerative disease of nervous system, unspecified: Secondary | ICD-10-CM | POA: Diagnosis not present

## 2020-01-05 DIAGNOSIS — Z89422 Acquired absence of other left toe(s): Secondary | ICD-10-CM | POA: Diagnosis not present

## 2020-01-05 DIAGNOSIS — E1129 Type 2 diabetes mellitus with other diabetic kidney complication: Secondary | ICD-10-CM | POA: Diagnosis not present

## 2020-01-05 DIAGNOSIS — I10 Essential (primary) hypertension: Secondary | ICD-10-CM | POA: Diagnosis not present

## 2020-01-05 DIAGNOSIS — G9389 Other specified disorders of brain: Secondary | ICD-10-CM | POA: Diagnosis not present

## 2020-01-05 DIAGNOSIS — R55 Syncope and collapse: Secondary | ICD-10-CM | POA: Diagnosis not present

## 2020-01-05 DIAGNOSIS — E1122 Type 2 diabetes mellitus with diabetic chronic kidney disease: Secondary | ICD-10-CM | POA: Diagnosis present

## 2020-01-05 DIAGNOSIS — Z79899 Other long term (current) drug therapy: Secondary | ICD-10-CM | POA: Diagnosis not present

## 2020-01-05 DIAGNOSIS — R32 Unspecified urinary incontinence: Secondary | ICD-10-CM | POA: Diagnosis present

## 2020-01-05 DIAGNOSIS — N184 Chronic kidney disease, stage 4 (severe): Secondary | ICD-10-CM | POA: Diagnosis present

## 2020-01-05 DIAGNOSIS — E785 Hyperlipidemia, unspecified: Secondary | ICD-10-CM | POA: Diagnosis present

## 2020-01-05 DIAGNOSIS — Z823 Family history of stroke: Secondary | ICD-10-CM | POA: Diagnosis not present

## 2020-01-05 DIAGNOSIS — R569 Unspecified convulsions: Secondary | ICD-10-CM | POA: Diagnosis not present

## 2020-01-05 DIAGNOSIS — K2091 Esophagitis, unspecified with bleeding: Secondary | ICD-10-CM | POA: Diagnosis present

## 2020-01-05 DIAGNOSIS — E1169 Type 2 diabetes mellitus with other specified complication: Secondary | ICD-10-CM | POA: Diagnosis present

## 2020-01-05 DIAGNOSIS — Z833 Family history of diabetes mellitus: Secondary | ICD-10-CM | POA: Diagnosis not present

## 2020-01-05 LAB — COMPREHENSIVE METABOLIC PANEL
ALT: 16 U/L (ref 0–44)
AST: 27 U/L (ref 15–41)
Albumin: 3.6 g/dL (ref 3.5–5.0)
Alkaline Phosphatase: 79 U/L (ref 38–126)
Anion gap: 13 (ref 5–15)
BUN: 24 mg/dL — ABNORMAL HIGH (ref 8–23)
CO2: 20 mmol/L — ABNORMAL LOW (ref 22–32)
Calcium: 9.3 mg/dL (ref 8.9–10.3)
Chloride: 108 mmol/L (ref 98–111)
Creatinine, Ser: 2.45 mg/dL — ABNORMAL HIGH (ref 0.61–1.24)
GFR calc Af Amer: 28 mL/min — ABNORMAL LOW (ref >60)
GFR calc non Af Amer: 24 mL/min — ABNORMAL LOW (ref >60)
Glucose, Bld: 85 mg/dL (ref 70–99)
Potassium: 3.9 mmol/L (ref 3.5–5.1)
Sodium: 141 mmol/L (ref 135–145)
Total Bilirubin: 1 mg/dL (ref 0.3–1.2)
Total Protein: 7.2 g/dL (ref 6.5–8.1)

## 2020-01-05 LAB — CBC
HCT: 41.8 % (ref 39.0–52.0)
Hemoglobin: 13.8 g/dL (ref 13.0–17.0)
MCH: 28.6 pg (ref 26.0–34.0)
MCHC: 33 g/dL (ref 30.0–36.0)
MCV: 86.7 fL (ref 80.0–100.0)
Platelets: 319 10*3/uL (ref 150–400)
RBC: 4.82 MIL/uL (ref 4.22–5.81)
RDW: 14 % (ref 11.5–15.5)
WBC: 16.6 10*3/uL — ABNORMAL HIGH (ref 4.0–10.5)
nRBC: 0 % (ref 0.0–0.2)

## 2020-01-05 LAB — URINALYSIS, ROUTINE W REFLEX MICROSCOPIC
Bacteria, UA: NONE SEEN
Bilirubin Urine: NEGATIVE
Glucose, UA: NEGATIVE mg/dL
Ketones, ur: NEGATIVE mg/dL
Leukocytes,Ua: NEGATIVE
Nitrite: NEGATIVE
Protein, ur: >=300 mg/dL — AB
Specific Gravity, Urine: 1.023 (ref 1.005–1.030)
pH: 5 (ref 5.0–8.0)

## 2020-01-05 LAB — TYPE AND SCREEN
ABO/RH(D): O POS
Antibody Screen: NEGATIVE

## 2020-01-05 LAB — CBG MONITORING, ED: Glucose-Capillary: 95 mg/dL (ref 70–99)

## 2020-01-05 LAB — POC OCCULT BLOOD, ED: Fecal Occult Bld: NEGATIVE

## 2020-01-05 MED ORDER — ONDANSETRON HCL 4 MG/2ML IJ SOLN
4.0000 mg | Freq: Once | INTRAMUSCULAR | Status: AC
  Administered 2020-01-05: 4 mg via INTRAVENOUS
  Filled 2020-01-05: qty 2

## 2020-01-05 MED ORDER — PANTOPRAZOLE SODIUM 40 MG PO TBEC
40.0000 mg | DELAYED_RELEASE_TABLET | Freq: Every day | ORAL | 0 refills | Status: DC
Start: 2020-01-05 — End: 2020-01-08

## 2020-01-05 MED ORDER — PANTOPRAZOLE SODIUM 40 MG IV SOLR
40.0000 mg | Freq: Once | INTRAVENOUS | Status: AC
  Administered 2020-01-05: 40 mg via INTRAVENOUS
  Filled 2020-01-05: qty 40

## 2020-01-05 NOTE — ED Provider Notes (Signed)
[facility] EMERGENCY DEPARTMENT Provider Note   CSN: [id number] Arrival date & time: 01/05/20  1014     History Chief Complaint  Patient presents with  . GI Bleeding    David Ford is a 79 y.o. male.  HPI   79 year old male with a history of AKI, prostate cancer, CKD, diabetes, DKA, hyperlipidemia, hypertension, presents the emergency department today for evaluation of hematemesis.  Patient states that yesterday he experienced vomiting multiple times throughout the day which has continued today.  States his vomit has been black and he describes it as looking like coffee grounds.  He denies any diarrhea or abdominal pain.  States he has not had a BM in 2 days and is not sure if he has had any bloody or melanotic stools.  He has had some associated dizziness.  He denies any chest pain, shortness of breath, syncopal episodes.  He does note that 3 days ago he had a period of confusion where he was confused about which room in the house he was in he was craving food that he does not normally eat for breakfast.  He states the confusion has since resolved.  He denies any visual changes, headaches, unilateral numbness/weakness or any speech difficulty.  He is not anticoagulated.  Denies heavy NSAID use and states that he only drinks alcohol every 1 to 3 weeks.  He usually only has 1 glass of wine when he does drink.  Past Medical History:  Diagnosis Date  . AKI (acute kidney injury) ([place]) 06/18/2015  . Cancer (HCC)    hx of prostate cancer  . Chronic kidney disease   . Diabetes mellitus without complication ([place])   . DKA (diabetic ketoacidoses) ([place]) 06/18/2015  . Glaucoma   . Hyperlipidemia   . Hypertension     Patient Active Problem List   Diagnosis Date Noted  . Dermatitis 08/20/2018  . Glaucoma 01/31/2018  . CKD (chronic kidney disease), stage III 08/03/2017  . Hyperlipidemia associated with type 2 diabetes mellitus ([place]) 06/02/2017  . BPH (benign prostatic  hyperplasia) 06/02/2017  . History of prostate cancer 06/02/2017  . Insomnia 06/02/2017  . Hypertension associated with diabetes ([place]) 06/17/2015  . DM type 2, uncontrolled, with renal complications ([place]) 78/[id number]/6720    Past Surgical History:  Procedure Laterality Date  . AMPUTATION Left 07/03/2015   Procedure: Left Foot 1st and 2nd Ray Amputation;  Surgeon: Newt Minion, MD;  Location: [facility];  Service: Orthopedics;  Laterality: Left;  . BASCILIC VEIN TRANSPOSITION Left 07/14/2015   Procedure: BRACHIOBASILIC VEIN TRANSPOSITION  ;  Surgeon: Elam Dutch, MD;  Location: [facility];  Service: Vascular;  Laterality: Left;  . IRIDOTOMY / IRIDECTOMY    . prostate seeds         Family History  Problem Relation Age of Onset  . Diabetes Mother   . Hypertension Mother   . Stroke Mother   . Diabetes Sister   . Glaucoma Sister   . Diabetes Brother   . Glaucoma Brother   . Amblyopia Neg Hx   . Blindness Neg Hx   . Cataracts Neg Hx   . Macular degeneration Neg Hx   . Retinal detachment Neg Hx   . Strabismus Neg Hx   . Retinitis pigmentosa Neg Hx     Social History   Tobacco Use  . Smoking status: Never Smoker  . Smokeless tobacco: Never Used  Vaping Use  . Vaping Use: Never used  Substance Use Topics  .  Alcohol use: Yes    Comment: once a week  . Drug use: No    Home Medications Prior to Admission medications   Medication Sig Start Date End Date Taking? Authorizing Provider  amLODipine (NORVASC) 10 MG tablet TAKE 1 TABLET EVERY DAY 12/27/19   [name], MD  atorvastatin (LIPITOR) 10 MG tablet TAKE 1 TABLET EVERY DAY 12/04/19   [name], MD  azelastine (ASTELIN) 0.1 % nasal spray Place 2 sprays into both nostrils 2 (two) times daily. 05/09/19   [name], MD  BD PEN NEEDLE NANO U/F 32G X 4 MM MISC USE WITH PEN INJECTOR DAILY 01/03/20   [name], MD  Blood Glucose Monitoring Suppl (TRUE METRIX AIR GLUCOSE METER) w/Device KIT  09/21/17   [provider]  brimonidine (ALPHAGAN) 0.2 % ophthalmic solution  06/01/18   [provider]  carvedilol (COREG) 3.125 MG tablet TAKE 1 TABLET TWICE DAILY WITH MEALS 09/18/19   [name], MD  dorzolamide-timolol (COSOPT) 22.3-6.8 MG/ML ophthalmic solution  04/02/19   [provider]  fluticasone (FLONASE) 50 MCG/ACT nasal spray Place 2 sprays into both nostrils daily. 07/24/18   [name], MD  glimepiride (AMARYL) 4 MG tablet TAKE 1 TABLET TWICE DAILY 01/03/20   [name], MD  glucose blood (TRUE METRIX BLOOD GLUCOSE TEST) test strip CHECK BLOOD SUGAR FOUR TIMES DAILY  AND AS NEEDED 10/09/19   [name], MD  Insulin Glargine (LANTUS) 100 UNIT/ML Solostar Pen Inject 35-50 Units into the skin daily. 07/17/19   [name], MD  Insulin Pen Needle (PEN NEEDLES) 32G X 5 MM MISC 1 application by Does not apply route 4 (four) times daily as needed (to inject insulin). 08/03/17   [name], MD  latanoprost (XALATAN) 0.005 % ophthalmic solution INSTILL 1 DROP INTO BOTH EYES AT BEDTIME 03/29/18   [name], MD  pantoprazole (PROTONIX) 40 MG tablet Take 1 tablet (40 mg total) by mouth daily. 01/05/20 02/04/20  Feliz Herard S, PA-C  tamsulosin (FLOMAX) 0.4 MG CAPS capsule TAKE 1 CAPSULE EVERY DAY 09/18/19   [name], MD  traZODone (DESYREL) 150 MG tablet TAKE 2 TABLETS AT BEDTIME 12/27/19   [name], MD  triamcinolone ointment (KENALOG) 0.5 % APPLY TOPICALLY 2 TIMES DAILY 12/24/19   [name], MD  TRUEplus Lancets 33G MISC CHECK BLOOD SUGAR FOUR TIMES DAILY  AND AS NEEDED 10/04/19   [name], MD  Vitamin D, Cholecalciferol, 1000 units CAPS Take 1,000 Units by mouth daily.    [provider]    Allergies    Patient has no known allergies.  Review of Systems   Review of Systems  Constitutional: Negative for fever.  HENT: Negative for ear pain and sore throat.   Eyes: Negative for visual disturbance.  Respiratory: Negative for cough and  shortness of breath.   Cardiovascular: Negative for chest pain.  Gastrointestinal: Positive for nausea and vomiting. Negative for abdominal pain, constipation and diarrhea.  Genitourinary: Negative for dysuria and hematuria.  Musculoskeletal: Negative for back pain.  Skin: Negative for rash.  Neurological: Positive for dizziness. Negative for seizures, weakness, light-headedness, numbness and headaches.  All other systems reviewed and are negative.   Physical Exam Updated Vital Signs BP (!) 161/81 (BP Location: Left Arm)   Pulse 78   Temp 98.2 F (36.8 C) (Oral)   Resp 16   Ht 6' 1" (1.854 m)   [employer] [facility]  99.8 kg   SpO2 95%   BMI 29.03 kg/m   Physical Exam Vitals and nursing note reviewed.  Constitutional:      Appearance: He is well-developed.  HENT:     Head: Normocephalic and atraumatic.  Eyes:     Conjunctiva/sclera: Conjunctivae normal.  Cardiovascular:     Rate and Rhythm: Normal rate and regular rhythm.     Heart sounds: Normal heart sounds. No murmur heard.   Pulmonary:     Effort: Pulmonary effort is normal. No respiratory distress.     Breath sounds: Normal breath sounds. No wheezing, rhonchi or rales.  Abdominal:     General: Bowel sounds are normal.     Palpations: Abdomen is soft.     Tenderness: There is no abdominal tenderness. There is no guarding or rebound.  Genitourinary:    Comments: Chaperone present. DRE performed and light brown stool was noted in the rectal vault. No bright red blood or melena noted.  Musculoskeletal:     Cervical back: Neck supple.  Skin:    General: Skin is warm and dry.  Neurological:     Mental Status: He is alert.     ED Results / Procedures / Treatments   Labs (all labs ordered are listed, but only abnormal results are displayed) Labs Reviewed  COMPREHENSIVE METABOLIC PANEL - Abnormal; Notable for the following components:      Result Value   CO2 20 (*)    BUN 24 (*)    Creatinine, Ser 2.45 (*)    GFR calc non Af  Amer 24 (*)    GFR calc Af Amer 28 (*)    All other components within normal limits  CBC - Abnormal; Notable for the following components:   WBC 16.6 (*)    All other components within normal limits  URINALYSIS, ROUTINE W REFLEX MICROSCOPIC - Abnormal; Notable for the following components:   APPearance HAZY (*)    Hgb urine dipstick LARGE (*)    Protein, ur >=300 (*)    All other components within normal limits  POC OCCULT BLOOD, ED  CBG MONITORING, ED  TYPE AND SCREEN    EKG EKG Interpretation  Date/Time:  Sunday January 05 2020 10:40:22 EDT Ventricular Rate:  94 PR Interval:  202 QRS Duration: 144 QT Interval:  390 QTC Calculation: 487 R Axis:   -19 Text Interpretation: Sinus rhythm with Premature atrial complexes Left bundle branch block Abnormal ECG No significant change since last tracing Confirmed by Gareth Morgan [phone]) on 01/05/2020 4:45:36 PM   Radiology CT ABDOMEN PELVIS WO CONTRAST  Result Date: 01/05/2020 CLINICAL DATA:  Hematemesis nausea vomiting EXAM: CT ABDOMEN AND PELVIS WITHOUT CONTRAST TECHNIQUE: Multidetector CT imaging of the abdomen and pelvis was performed following the standard protocol without IV contrast. COMPARISON:  June 19, 2015 FINDINGS: Lower chest: The visualized heart size within normal limits. No pericardial fluid/thickening. The visualized portions of the lungs are clear. Hepatobiliary: Although limited due to the lack of intravenous contrast, normal in appearance without gross focal abnormality. A small amount of sludge/ stones are seen within the gallbladder. Pancreas:  Unremarkable.  No surrounding inflammatory changes. Spleen: Normal in size. Although limited due to the lack of intravenous contrast, normal in appearance. Adrenals/Urinary Tract: Mild thickening of the bilateral adrenal glands perinephric stranding changes are seen. No renal or collecting system calculi. No hydronephrosis. The bladder is unremarkable. There Stomach/Bowel: There  is a small hiatal hernia with diffuse wall thickening seen around the distal esophagus.  The remainder of the stomach is unremarkable. Small bowel, and colon are normal in appearance. No inflammatory changes or obstructive findings. There is scattered colonic diverticula are noted without diverticulitis. Appendix is normal. Vascular/Lymphatic: There are no enlarged abdominal or pelvic lymph nodes. Scattered aortic atherosclerotic calcifications are seen without aneurysmal dilatation. Reproductive: Radiation prostate seeds are noted probably the small amount of fluid is seen surrounding the right lower quadrant prosthetic polyp. Other: No evidence of abdominal wall mass or hernia. Musculoskeletal: No acute or significant osseous findings. IMPRESSION: Findings which could be suggestive of distal esophagitis. Cholelithiasis without evidence of acute cholecystitis. Diverticulosis without diverticulitis. Aortic Atherosclerosis (ICD10-I70.0). Electronically Signed   By: Prudencio Pair M.D.   On: 01/05/2020 16:39   CT Head Wo Contrast  Result Date: 01/05/2020 CLINICAL DATA:  Altered mental status for the past 3 days. EXAM: CT HEAD WITHOUT CONTRAST TECHNIQUE: Contiguous axial images were obtained from the base of the skull through the vertex without intravenous contrast. COMPARISON:  None. FINDINGS: Brain: Mildly enlarged ventricles and cortical sulci. Mild-to-moderate patchy white matter low density in both cerebral hemispheres. No intracranial hemorrhage, mass lesion or CT evidence of acute infarction. Vascular: No hyperdense vessel or unexpected calcification. Skull: Normal. Negative for fracture or focal lesion. Sinuses/Orbits: Status post bilateral cataract extraction. Small left maxillary sinus retention cyst. Other: None. IMPRESSION: 1. No acute abnormality. 2. Mild to moderate diffuse cerebral and cerebellar atrophy. 3. Mild to moderate chronic small vessel white matter ischemic changes in both cerebral  hemispheres. Electronically Signed   By: Claudie Revering M.D.   On: 01/05/2020 18:00    Procedures Procedures (including critical care time)  Medications Ordered in ED Medications  ondansetron (ZOFRAN) injection 4 mg (4 mg Intravenous Given 01/05/20 1638)  pantoprazole (PROTONIX) injection 40 mg (40 mg Intravenous Given 01/05/20 1634)    ED Course  I have reviewed the triage vital signs and the nursing notes.  Pertinent labs & imaging results that were available during my care of the patient were reviewed by me and considered in my medical decision making (see chart for details).    MDM Rules/Calculators/A&P                          79 year old male presenting for evaluation of suspected hematemesis.  Had multiple episodes of vomiting yesterday that he describes as black.  Denies abdominal pain.  No syncope at home.  Reviewed/interpreted labs CBC revealed a leukocytosis at 16,000, no anemia present CMP with elevated BUN/creatinine, history of CKD and creatinine only slightly increased from baseline.  LFTs within normal limits. UA with hematuria, proteinuria, no evidence for UTI Hemoccult negative, stool was light brown  CT head with no acute findings  CT abd/pelvis with findings which could be suggestive of distal esophagitis. Cholelithiasis without evidence of acute cholecystitis. Diverticulosis without diverticulitis. Aortic Atherosclerosis   Discussed case with Dr. Billy Fischer, supervising physician, who personally evaluated the patient.  We suspect that the patient may have some bleeding from esophagitis seen on his CT scan.  He may also have PUD however given that his symptoms have seemed to resolve and he has had no episodes of vomiting in the last 7 hours while being in the ED and is now tolerating p.o., feel that he may be appropriate for discharge with initiation of PPI and close follow-up with gastroenterology.  Will consult GI to ensure close follow-up can be arranged and to  obtain further recommendations.  5:10 PM CONSULT  with Dr. Fuller Plan with gastroenterology. He recommends liquid diet, advance as tolerated. Recommends administration of PPI and close f/u in the office.    Rechecked pt. He is in no acute distress.  He has been able to tolerate p.o.  Discussed plan for f/u and return precautions.  I also discussed plan with his wife.  They voiced understanding are in agreement.  All questions answered.  Patient stable for discharge.  Final Clinical Impression(s) / ED Diagnoses Final diagnoses:  Hematemesis, presence of nausea not specified  Esophagitis    Rx / DC Orders ED Discharge Orders         Ordered    pantoprazole (PROTONIX) 40 MG tablet  Daily     Discontinue  Reprint     01/05/20 1821           Rodney Booze, PA-C 01/05/20 1924    Gareth Morgan, MD 01/06/20 2110

## 2020-01-05 NOTE — ED Notes (Signed)
Pt stated that he felt fine during orthostatic vitals.

## 2020-01-05 NOTE — ED Triage Notes (Signed)
Pt reports he woke up with dizziness and confusion 3 days ago.  States he was hungry and was looking for something other than breakfast to eat (which was unusual).  States he knew he was in his house but didn't know which room he was in.  Denies numbness/weakness.  Those symptoms resolved.  Yesterday he started vomiting coffee ground emesis.  Reports sore throat this morning.  Denies abd pain.  No arm drift.

## 2020-01-05 NOTE — ED Notes (Signed)
Patient verbalizes understanding of discharge instructions. Opportunity for questioning and answers were provided. Armband removed by staff, pt discharged from ED.  

## 2020-01-05 NOTE — Discharge Instructions (Addendum)
The CT scan showed that you have some inflammation in your esophagus called esophagitis.  This is likely the cause of the bleeding.  You may also have ulcers but we are not able to see them on a CT scan and you will need to follow-up with a GI doctor and may need to have an endoscopy to further evaluate this.  You are given a medication called Protonix to help treat esophagitis and also help an ulcer if one is present.  Please take this as directed.    You were given information to follow-up with a gastroenterology doctor.  Please call the office to schedule an appointment for follow-up.  Please monitor your symptoms closely.  If you have any continued, new or worsening symptoms such as persistent vomiting, abdominal pain, fevers, lightheadedness, passing out or any other concerns you need to the emergency department immediately.

## 2020-01-06 ENCOUNTER — Other Ambulatory Visit: Payer: Self-pay

## 2020-01-06 ENCOUNTER — Encounter (HOSPITAL_COMMUNITY): Payer: Self-pay

## 2020-01-06 ENCOUNTER — Emergency Department (HOSPITAL_COMMUNITY): Payer: Medicare HMO

## 2020-01-06 ENCOUNTER — Inpatient Hospital Stay (HOSPITAL_COMMUNITY)
Admission: EM | Admit: 2020-01-06 | Discharge: 2020-01-08 | DRG: 682 | Disposition: A | Discharge status: Home Health | Payer: Medicare HMO | Attending: Internal Medicine | Admitting: Internal Medicine

## 2020-01-06 DIAGNOSIS — K2091 Esophagitis, unspecified with bleeding: Secondary | ICD-10-CM | POA: Diagnosis present

## 2020-01-06 DIAGNOSIS — R32 Unspecified urinary incontinence: Secondary | ICD-10-CM | POA: Diagnosis present

## 2020-01-06 DIAGNOSIS — N179 Acute kidney failure, unspecified: Secondary | ICD-10-CM | POA: Diagnosis present

## 2020-01-06 DIAGNOSIS — I129 Hypertensive chronic kidney disease with stage 1 through stage 4 chronic kidney disease, or unspecified chronic kidney disease: Principal | ICD-10-CM | POA: Diagnosis present

## 2020-01-06 DIAGNOSIS — Z79899 Other long term (current) drug therapy: Secondary | ICD-10-CM

## 2020-01-06 DIAGNOSIS — E860 Dehydration: Secondary | ICD-10-CM | POA: Diagnosis present

## 2020-01-06 DIAGNOSIS — E1169 Type 2 diabetes mellitus with other specified complication: Secondary | ICD-10-CM | POA: Diagnosis present

## 2020-01-06 DIAGNOSIS — I152 Hypertension secondary to endocrine disorders: Secondary | ICD-10-CM | POA: Diagnosis present

## 2020-01-06 DIAGNOSIS — E11649 Type 2 diabetes mellitus with hypoglycemia without coma: Secondary | ICD-10-CM | POA: Diagnosis present

## 2020-01-06 DIAGNOSIS — I10 Essential (primary) hypertension: Secondary | ICD-10-CM

## 2020-01-06 DIAGNOSIS — E1165 Type 2 diabetes mellitus with hyperglycemia: Secondary | ICD-10-CM | POA: Diagnosis present

## 2020-01-06 DIAGNOSIS — E1122 Type 2 diabetes mellitus with diabetic chronic kidney disease: Secondary | ICD-10-CM | POA: Diagnosis present

## 2020-01-06 DIAGNOSIS — E785 Hyperlipidemia, unspecified: Secondary | ICD-10-CM | POA: Diagnosis present

## 2020-01-06 DIAGNOSIS — R55 Syncope and collapse: Secondary | ICD-10-CM

## 2020-01-06 DIAGNOSIS — Z89422 Acquired absence of other left toe(s): Secondary | ICD-10-CM

## 2020-01-06 DIAGNOSIS — E119 Type 2 diabetes mellitus without complications: Secondary | ICD-10-CM | POA: Diagnosis present

## 2020-01-06 DIAGNOSIS — N189 Chronic kidney disease, unspecified: Secondary | ICD-10-CM | POA: Diagnosis present

## 2020-01-06 DIAGNOSIS — E162 Hypoglycemia, unspecified: Secondary | ICD-10-CM

## 2020-01-06 DIAGNOSIS — Z794 Long term (current) use of insulin: Secondary | ICD-10-CM

## 2020-01-06 DIAGNOSIS — Z833 Family history of diabetes mellitus: Secondary | ICD-10-CM

## 2020-01-06 DIAGNOSIS — Z8249 Family history of ischemic heart disease and other diseases of the circulatory system: Secondary | ICD-10-CM

## 2020-01-06 DIAGNOSIS — Z20822 Contact with and (suspected) exposure to covid-19: Secondary | ICD-10-CM | POA: Diagnosis present

## 2020-01-06 DIAGNOSIS — N184 Chronic kidney disease, stage 4 (severe): Secondary | ICD-10-CM | POA: Diagnosis present

## 2020-01-06 DIAGNOSIS — K209 Esophagitis, unspecified without bleeding: Secondary | ICD-10-CM | POA: Diagnosis present

## 2020-01-06 DIAGNOSIS — N183 Chronic kidney disease, stage 3 unspecified: Secondary | ICD-10-CM | POA: Diagnosis present

## 2020-01-06 DIAGNOSIS — K920 Hematemesis: Secondary | ICD-10-CM | POA: Diagnosis present

## 2020-01-06 DIAGNOSIS — E1129 Type 2 diabetes mellitus with other diabetic kidney complication: Secondary | ICD-10-CM

## 2020-01-06 DIAGNOSIS — Z823 Family history of stroke: Secondary | ICD-10-CM

## 2020-01-06 DIAGNOSIS — Z8546 Personal history of malignant neoplasm of prostate: Secondary | ICD-10-CM

## 2020-01-06 DIAGNOSIS — E1159 Type 2 diabetes mellitus with other circulatory complications: Secondary | ICD-10-CM

## 2020-01-06 LAB — CBC
HCT: 43 % (ref 39.0–52.0)
Hemoglobin: 14.1 g/dL (ref 13.0–17.0)
MCH: 29 pg (ref 26.0–34.0)
MCHC: 32.8 g/dL (ref 30.0–36.0)
MCV: 88.3 fL (ref 80.0–100.0)
Platelets: 293 10*3/uL (ref 150–400)
RBC: 4.87 MIL/uL (ref 4.22–5.81)
RDW: 14.3 % (ref 11.5–15.5)
WBC: 18.7 10*3/uL — ABNORMAL HIGH (ref 4.0–10.5)
nRBC: 0 % (ref 0.0–0.2)

## 2020-01-06 LAB — COMPREHENSIVE METABOLIC PANEL
ALT: 17 U/L (ref 0–44)
AST: 38 U/L (ref 15–41)
Albumin: 3.5 g/dL (ref 3.5–5.0)
Alkaline Phosphatase: 75 U/L (ref 38–126)
Anion gap: 13 (ref 5–15)
BUN: 30 mg/dL — ABNORMAL HIGH (ref 8–23)
CO2: 21 mmol/L — ABNORMAL LOW (ref 22–32)
Calcium: 9 mg/dL (ref 8.9–10.3)
Chloride: 107 mmol/L (ref 98–111)
Creatinine, Ser: 2.8 mg/dL — ABNORMAL HIGH (ref 0.61–1.24)
GFR calc Af Amer: 24 mL/min — ABNORMAL LOW (ref >60)
GFR calc non Af Amer: 21 mL/min — ABNORMAL LOW (ref >60)
Glucose, Bld: 102 mg/dL — ABNORMAL HIGH (ref 70–99)
Potassium: 3.8 mmol/L (ref 3.5–5.1)
Sodium: 141 mmol/L (ref 135–145)
Total Bilirubin: 1 mg/dL (ref 0.3–1.2)
Total Protein: 7.2 g/dL (ref 6.5–8.1)

## 2020-01-06 LAB — URINALYSIS, ROUTINE W REFLEX MICROSCOPIC
Bilirubin Urine: NEGATIVE
Glucose, UA: NEGATIVE mg/dL
Ketones, ur: NEGATIVE mg/dL
Leukocytes,Ua: NEGATIVE
Nitrite: NEGATIVE
Protein, ur: >=300 mg/dL — AB
Specific Gravity, Urine: 1.02 (ref 1.005–1.030)
pH: 5 (ref 5.0–8.0)

## 2020-01-06 LAB — CBG MONITORING, ED
Glucose-Capillary: 124 mg/dL — ABNORMAL HIGH (ref 70–99)
Glucose-Capillary: 94 mg/dL (ref 70–99)
Glucose-Capillary: 69 mg/dL — ABNORMAL LOW (ref 70–99)
Glucose-Capillary: 114 mg/dL — ABNORMAL HIGH (ref 70–99)
Glucose-Capillary: 90 mg/dL (ref 70–99)
Glucose-Capillary: 159 mg/dL — ABNORMAL HIGH (ref 70–99)
Glucose-Capillary: 275 mg/dL — ABNORMAL HIGH (ref 70–99)
Glucose-Capillary: 306 mg/dL — ABNORMAL HIGH (ref 70–99)
Glucose-Capillary: 318 mg/dL — ABNORMAL HIGH (ref 70–99)
Glucose-Capillary: 286 mg/dL — ABNORMAL HIGH (ref 70–99)

## 2020-01-06 LAB — LIPASE, BLOOD: Lipase: 18 U/L (ref 11–51)

## 2020-01-06 MED ORDER — ONDANSETRON 4 MG PO TBDP
4.0000 mg | ORAL_TABLET | Freq: Three times a day (TID) | ORAL | 0 refills | Status: DC | PRN
Start: 2020-01-06 — End: 2020-10-01

## 2020-01-06 MED ORDER — ATORVASTATIN CALCIUM 10 MG PO TABS: 10.0000 mg | ORAL_TABLET | Freq: Every day | ORAL | Status: DC

## 2020-01-06 MED ORDER — AMLODIPINE BESYLATE 10 MG PO TABS
10.0000 mg | ORAL_TABLET | Freq: Every day | ORAL | Status: DC
  Administered 2020-01-07 – 2020-01-08 (×2): 10 mg via ORAL
  Filled 2020-01-06 (×2): qty 1

## 2020-01-06 MED ORDER — FLUTICASONE PROPIONATE 50 MCG/ACT NA SUSP
2.0000 | Freq: Every day | NASAL | Status: DC
  Administered 2020-01-08: 2 via NASAL
  Filled 2020-01-06: qty 16

## 2020-01-06 MED ORDER — FAMOTIDINE IN NACL 20-0.9 MG/50ML-% IV SOLN
20.0000 mg | Freq: Once | INTRAVENOUS | Status: AC
  Administered 2020-01-06: 20 mg via INTRAVENOUS
  Filled 2020-01-06: qty 50

## 2020-01-06 MED ORDER — ONDANSETRON HCL 4 MG/2ML IJ SOLN: 4.0000 mg | Freq: Four times a day (QID) | INTRAMUSCULAR | Status: DC | PRN

## 2020-01-06 MED ORDER — PANTOPRAZOLE SODIUM 40 MG PO TBEC: 40.0000 mg | DELAYED_RELEASE_TABLET | Freq: Every day | ORAL | Status: DC

## 2020-01-06 MED ORDER — SODIUM CHLORIDE 0.9 % IV BOLUS
500.0000 mL | Freq: Once | INTRAVENOUS | Status: AC
  Administered 2020-01-06: 500 mL via INTRAVENOUS

## 2020-01-06 MED ORDER — PANTOPRAZOLE SODIUM 40 MG PO TBEC
40.0000 mg | DELAYED_RELEASE_TABLET | Freq: Once | ORAL | Status: AC
  Administered 2020-01-06: 40 mg via ORAL
  Filled 2020-01-06: qty 1

## 2020-01-06 MED ORDER — ONDANSETRON HCL 4 MG PO TABS: 4.0000 mg | ORAL_TABLET | Freq: Four times a day (QID) | ORAL | Status: DC | PRN

## 2020-01-06 MED ORDER — LATANOPROST 0.005 % OP SOLN
1.0000 [drp] | Freq: Every day | OPHTHALMIC | Status: DC
  Filled 2020-01-06 (×2): qty 2.5

## 2020-01-06 MED ORDER — TRAZODONE HCL 50 MG PO TABS
300.0000 mg | ORAL_TABLET | Freq: Every day | ORAL | Status: DC
  Administered 2020-01-06 – 2020-01-07 (×2): 300 mg via ORAL
  Filled 2020-01-06: qty 3
  Filled 2020-01-06: qty 6

## 2020-01-06 MED ORDER — ACETAMINOPHEN 325 MG PO TABS: 650.0000 mg | ORAL_TABLET | Freq: Four times a day (QID) | ORAL | Status: DC | PRN

## 2020-01-06 MED ORDER — ACETAMINOPHEN 650 MG RE SUPP: 650.0000 mg | Freq: Four times a day (QID) | RECTAL | Status: DC | PRN

## 2020-01-06 MED ORDER — PANTOPRAZOLE SODIUM 20 MG PO TBEC
20.0000 mg | DELAYED_RELEASE_TABLET | Freq: Every day | ORAL | 0 refills | Status: DC
Start: 2020-01-06 — End: 2020-01-08

## 2020-01-06 MED ORDER — INSULIN ASPART 100 UNIT/ML ~~LOC~~ SOLN
0.0000 [IU] | SUBCUTANEOUS | Status: DC
  Administered 2020-01-06 – 2020-01-07 (×2): 7 [IU] via SUBCUTANEOUS
  Administered 2020-01-07: 5 [IU] via SUBCUTANEOUS
  Administered 2020-01-07: 1 [IU] via SUBCUTANEOUS
  Administered 2020-01-07: 7 [IU] via SUBCUTANEOUS
  Administered 2020-01-08 (×2): 2 [IU] via SUBCUTANEOUS

## 2020-01-06 MED ORDER — SODIUM CHLORIDE 0.9 % IV SOLN: Freq: Once | INTRAVENOUS | Status: AC

## 2020-01-06 MED ORDER — SODIUM CHLORIDE 0.9 % IV SOLN: INTRAVENOUS | Status: DC

## 2020-01-06 MED ORDER — CARVEDILOL 3.125 MG PO TABS
3.1250 mg | ORAL_TABLET | Freq: Two times a day (BID) | ORAL | Status: DC
  Administered 2020-01-07 – 2020-01-08 (×2): 3.125 mg via ORAL
  Filled 2020-01-06 (×2): qty 1

## 2020-01-06 MED ORDER — TAMSULOSIN HCL 0.4 MG PO CAPS
0.4000 mg | ORAL_CAPSULE | Freq: Every day | ORAL | Status: DC
  Administered 2020-01-07 – 2020-01-08 (×2): 0.4 mg via ORAL
  Filled 2020-01-06 (×2): qty 1

## 2020-01-06 NOTE — H&P (Signed)
History and Physical    Quinnton Bury IPJ:[id number] DOB: 1940/07/05 DOA: 01/06/2020  PCP: [name], MD  Patient coming from: Home  I have personally briefly reviewed patient's old medical records in [place]  Chief Complaint: N/V, hypoglycemia  HPI: Denorris Reust is a 79 y.o. male with medical history significant of DM2, HTN, HLD, CKD 3, looks like baseline creat 1.9 back in March 2020.  Pt with N/V, had hematemesis yesterday.  Seen in ED, CT diagnosed with esophagitis.  Pt started on PPI though hasnt filled this at pharmacy yet.  Outpt follow up with GI made.  Today continued to have poor PO intake.  Went to sleep on couch for nap.  Was unresponsive later per family.  Found to have hypoglycemia by EMS.  Given D50, mental status improved.   ED Course: BGL stabilized in ED.  Pt hasnt taken any of his insulin or DM meds today.  But creat today up to 2.8 (was 2.4 yesterday and 1.9 in March 2020).  Pt put on IVF.  WBC 18.7k up from 16.6k.  Started on PPI and famotidine in ED.  Hospitalist asked to admit for obs for AKI.   Review of Systems: As per HPI, otherwise all review of systems negative.  Past Medical History:  Diagnosis Date  . AKI (acute kidney injury) ([place]) 06/18/2015  . Cancer (HCC)    hx of prostate cancer  . Chronic kidney disease   . Diabetes mellitus without complication ([place])   . DKA (diabetic ketoacidoses) ([place]) 06/18/2015  . Glaucoma   . Hyperlipidemia   . Hypertension     Past Surgical History:  Procedure Laterality Date  . AMPUTATION Left 07/03/2015   Procedure: Left Foot 1st and 2nd Ray Amputation;  Surgeon: Newt Minion, MD;  Location: [facility];  Service: Orthopedics;  Laterality: Left;  . BASCILIC VEIN TRANSPOSITION Left 07/14/2015   Procedure: BRACHIOBASILIC VEIN TRANSPOSITION  ;  Surgeon: Elam Dutch, MD;  Location: [facility];  Service: Vascular;  Laterality: Left;  . IRIDOTOMY / IRIDECTOMY    . prostate seeds       reports that he  has never smoked. He has never used smokeless tobacco. He reports current alcohol use. He reports that he does not use drugs.  No Known Allergies  Family History  Problem Relation Age of Onset  . Diabetes Mother   . Hypertension Mother   . Stroke Mother   . Diabetes Sister   . Glaucoma Sister   . Diabetes Brother   . Glaucoma Brother   . Amblyopia Neg Hx   . Blindness Neg Hx   . Cataracts Neg Hx   . Macular degeneration Neg Hx   . Retinal detachment Neg Hx   . Strabismus Neg Hx   . Retinitis pigmentosa Neg Hx      Prior to Admission medications   Medication Sig Start Date End Date Taking? Authorizing Provider  amLODipine (NORVASC) 10 MG tablet TAKE 1 TABLET EVERY DAY Patient taking differently: Take 10 mg by mouth daily.  12/27/19  Yes [name], MD  atorvastatin (LIPITOR) 10 MG tablet TAKE 1 TABLET EVERY DAY Patient taking differently: Take 10 mg by mouth daily.  12/04/19  Yes [name], MD  BD PEN NEEDLE NANO U/F 32G X 4 MM MISC USE WITH PEN INJECTOR DAILY Patient taking differently: 1 each by Other route See admin instructions. Use with pen injector daily 01/03/20  Yes [name], MD  Blood  Glucose Monitoring Suppl (TRUE METRIX AIR GLUCOSE METER) w/Device KIT 1 Device by Other route daily.  09/21/17  Yes [provider]  brimonidine (ALPHAGAN) 0.2 % ophthalmic solution  06/01/18  Yes [provider]  carvedilol (COREG) 3.125 MG tablet TAKE 1 TABLET TWICE DAILY WITH MEALS Patient taking differently: Take 3.125 mg by mouth 2 (two) times daily with a meal.  09/18/19  Yes [name], MD  fluticasone (FLONASE) 50 MCG/ACT nasal spray Place 2 sprays into both nostrils daily. 07/24/18  Yes [name], MD  glimepiride (AMARYL) 4 MG tablet TAKE 1 TABLET TWICE DAILY Patient taking differently: Take 4 mg by mouth in the morning and at bedtime.  01/03/20  Yes [name], MD  glucose blood (TRUE METRIX BLOOD GLUCOSE TEST) test strip CHECK BLOOD SUGAR  FOUR TIMES DAILY  AND AS NEEDED Patient taking differently: 1 each by Other route See admin instructions. CHECK BLOOD SUGAR FOUR TIMES DAILY  AND AS NEEDED 10/09/19  Yes [name], MD  Insulin Glargine (LANTUS) 100 UNIT/ML Solostar Pen Inject 35-50 Units into the skin daily. 07/17/19  Yes [name], MD  Insulin Pen Needle (PEN NEEDLES) 32G X 5 MM MISC 1 application by Does not apply route 4 (four) times daily as needed (to inject insulin). 08/03/17  Yes [name], MD  latanoprost (XALATAN) 0.005 % ophthalmic solution INSTILL 1 DROP INTO BOTH EYES AT BEDTIME Patient taking differently: Place 1 drop into both eyes at bedtime.  03/29/18  Yes [name], MD  tamsulosin (FLOMAX) 0.4 MG CAPS capsule TAKE 1 CAPSULE EVERY DAY Patient taking differently: Take 0.4 mg by mouth daily.  09/18/19  Yes [name], MD  traZODone (DESYREL) 150 MG tablet TAKE 2 TABLETS AT BEDTIME Patient taking differently: Take 300 mg by mouth at bedtime.  12/27/19  Yes [name], MD  triamcinolone ointment (KENALOG) 0.5 % APPLY TOPICALLY 2 TIMES DAILY Patient taking differently: Apply 1 application topically 2 (two) times daily.  12/24/19  Yes [name], MD  TRUEplus Lancets 33G MISC CHECK BLOOD SUGAR FOUR TIMES DAILY  AND AS NEEDED Patient taking differently: 1 each by Other route See admin instructions. CHECK BLOOD SUGAR FOUR TIMES DAILY  AND AS NEEDED 10/04/19  Yes [name], MD  Vitamin D, Cholecalciferol, 1000 units CAPS Take 1,000 Units by mouth daily.   Yes [provider]  dorzolamide-timolol (COSOPT) 22.3-6.8 MG/ML ophthalmic solution  04/02/19   [provider]  ondansetron (ZOFRAN ODT) 4 MG disintegrating tablet Take 1 tablet (4 mg total) by mouth every 8 (eight) hours as needed for nausea or vomiting. 01/06/20   Carlisle Cater, PA-C  pantoprazole (PROTONIX) 20 MG tablet Take 1 tablet (20 mg total) by mouth daily. 01/06/20   Carlisle Cater, PA-C  pantoprazole  (PROTONIX) 40 MG tablet Take 1 tablet (40 mg total) by mouth daily. 01/05/20 02/04/20  Rodney Booze, PA-C    Physical Exam: Vitals:   01/06/20 1600 01/06/20 1700 01/06/20 1930 01/06/20 1940  BP: (!) 182/76 (!) 158/78 (!) 165/74 (!) 165/74  Pulse: 89 81 82 84  Resp: (!) 21 (!) 27 (!) 26 (!) 28  Temp:      TempSrc:      SpO2: 98% 96% 95% 97%  Weight:      Height:        Constitutional: NAD, calm, comfortable Eyes: PERRL, lids and conjunctivae normal ENMT: Mucous membranes are moist. Posterior pharynx clear of  any exudate or lesions.Normal dentition.  Neck: normal, supple, no masses, no thyromegaly Respiratory: clear to auscultation bilaterally, no wheezing, no crackles. Normal respiratory effort. No accessory muscle use.  Cardiovascular: Regular rate and rhythm, no murmurs / rubs / gallops. No extremity edema. 2+ pedal pulses. No carotid bruits.  Abdomen: no tenderness, no masses palpated. No hepatosplenomegaly. Bowel sounds positive.  Musculoskeletal: no clubbing / cyanosis. No joint deformity upper and lower extremities. Good ROM, no contractures. Normal muscle tone.  Skin: no rashes, lesions, ulcers. No induration Neurologic: CN 2-12 grossly intact. Sensation intact, DTR normal. Strength 5/5 in all 4.  Psychiatric: Normal judgment and insight. Alert and oriented x 3. Normal mood.    Labs on Admission: I have personally reviewed following labs and imaging studies  CBC: Recent Labs  Lab 01/05/20 1048 01/06/20 1148  WBC 16.6* 18.7*  HGB 13.8 14.1  HCT 41.8 43.0  MCV 86.7 88.3  PLT 319 825   Basic Metabolic Panel: Recent Labs  Lab 01/05/20 1048 01/06/20 1148  NA 141 141  K 3.9 3.8  CL 108 107  CO2 20* 21*  GLUCOSE 85 102*  BUN 24* 30*  CREATININE 2.45* 2.80*  CALCIUM 9.3 9.0   GFR: Estimated Creatinine Clearance: 27 mL/min (A) (by C-G formula based on SCr of 2.8 mg/dL (H)). Liver Function Tests: Recent Labs  Lab 01/05/20 1048 01/06/20 1148  AST 27 38    ALT 16 17  ALKPHOS 79 75  BILITOT 1.0 1.0  PROT 7.2 7.2  ALBUMIN 3.6 3.5   Recent Labs  Lab 01/06/20 1148  LIPASE 18   No results for input(s): AMMONIA in the last 168 hours. Coagulation Profile: No results for input(s): INR, PROTIME in the last 168 hours. Cardiac Enzymes: No results for input(s): CKTOTAL, CKMB, CKMBINDEX, TROPONINI in the last 168 hours. BNP (last 3 results) No results for input(s): PROBNP in the last 8760 hours. HbA1C: No results for input(s): HGBA1C in the last 72 hours. CBG: Recent Labs  Lab 01/06/20 1544 01/06/20 1647 01/06/20 1800 01/06/20 1922 01/06/20 1957  GLUCAP 114* 90 159* 275* 306*   Lipid Profile: No results for input(s): CHOL, HDL, LDLCALC, TRIG, CHOLHDL, LDLDIRECT in the last 72 hours. Thyroid Function Tests: No results for input(s): TSH, T4TOTAL, FREET4, T3FREE, THYROIDAB in the last 72 hours. Anemia Panel: No results for input(s): VITAMINB12, FOLATE, FERRITIN, TIBC, IRON, RETICCTPCT in the last 72 hours. Urine analysis:    Component Value Date/Time   COLORURINE YELLOW 01/06/2020 1233   APPEARANCEUR HAZY (A) 01/06/2020 1233   LABSPEC 1.020 01/06/2020 1233   PHURINE 5.0 01/06/2020 1233   GLUCOSEU NEGATIVE 01/06/2020 1233   HGBUR MODERATE (A) 01/06/2020 1233   BILIRUBINUR NEGATIVE 01/06/2020 1233   KETONESUR NEGATIVE 01/06/2020 1233   PROTEINUR >=300 (A) 01/06/2020 1233   NITRITE NEGATIVE 01/06/2020 1233   LEUKOCYTESUR NEGATIVE 01/06/2020 1233    Radiological Exams on Admission: CT ABDOMEN PELVIS WO CONTRAST  Result Date: 01/05/2020 CLINICAL DATA:  Hematemesis nausea vomiting EXAM: CT ABDOMEN AND PELVIS WITHOUT CONTRAST TECHNIQUE: Multidetector CT imaging of the abdomen and pelvis was performed following the standard protocol without IV contrast. COMPARISON:  June 19, 2015 FINDINGS: Lower chest: The visualized heart size within normal limits. No pericardial fluid/thickening. The visualized portions of the lungs are clear.  Hepatobiliary: Although limited due to the lack of intravenous contrast, normal in appearance without gross focal abnormality. A small amount of sludge/ stones are seen within the gallbladder. Pancreas:  Unremarkable.  No surrounding inflammatory  changes. Spleen: Normal in size. Although limited due to the lack of intravenous contrast, normal in appearance. Adrenals/Urinary Tract: Mild thickening of the bilateral adrenal glands perinephric stranding changes are seen. No renal or collecting system calculi. No hydronephrosis. The bladder is unremarkable. There Stomach/Bowel: There is a small hiatal hernia with diffuse wall thickening seen around the distal esophagus. The remainder of the stomach is unremarkable. Small bowel, and colon are normal in appearance. No inflammatory changes or obstructive findings. There is scattered colonic diverticula are noted without diverticulitis. Appendix is normal. Vascular/Lymphatic: There are no enlarged abdominal or pelvic lymph nodes. Scattered aortic atherosclerotic calcifications are seen without aneurysmal dilatation. Reproductive: Radiation prostate seeds are noted probably the small amount of fluid is seen surrounding the right lower quadrant prosthetic polyp. Other: No evidence of abdominal wall mass or hernia. Musculoskeletal: No acute or significant osseous findings. IMPRESSION: Findings which could be suggestive of distal esophagitis. Cholelithiasis without evidence of acute cholecystitis. Diverticulosis without diverticulitis. Aortic Atherosclerosis (ICD10-I70.0). Electronically Signed   By: Prudencio Pair M.D.   On: 01/05/2020 16:39   CT Head Wo Contrast  Result Date: 01/05/2020 CLINICAL DATA:  Altered mental status for the past 3 days. EXAM: CT HEAD WITHOUT CONTRAST TECHNIQUE: Contiguous axial images were obtained from the base of the skull through the vertex without intravenous contrast. COMPARISON:  None. FINDINGS: Brain: Mildly enlarged ventricles and cortical  sulci. Mild-to-moderate patchy white matter low density in both cerebral hemispheres. No intracranial hemorrhage, mass lesion or CT evidence of acute infarction. Vascular: No hyperdense vessel or unexpected calcification. Skull: Normal. Negative for fracture or focal lesion. Sinuses/Orbits: Status post bilateral cataract extraction. Small left maxillary sinus retention cyst. Other: None. IMPRESSION: 1. No acute abnormality. 2. Mild to moderate diffuse cerebral and cerebellar atrophy. 3. Mild to moderate chronic small vessel white matter ischemic changes in both cerebral hemispheres. Electronically Signed   By: Claudie Revering M.D.   On: 01/05/2020 18:00   DG Chest Port 1 View  Result Date: 01/06/2020 CLINICAL DATA:  Hypoglycemia EXAM: PORTABLE CHEST 1 VIEW COMPARISON:  07/30/2017 FINDINGS: The heart size and mediastinal contours are within normal limits. Both lungs are clear. The visualized skeletal structures are unremarkable. IMPRESSION: No active disease. Electronically Signed   By: Donavan Foil M.D.   On: 01/06/2020 15:22    EKG: Independently reviewed.  Assessment/Plan Principal Problem:   AKI (acute kidney injury) ([place]) Active Problems:   Hypertension associated with diabetes ([place])   DM type 2, uncontrolled, with renal complications ([place])   Hyperlipidemia associated with type 2 diabetes mellitus ([place])   CKD (chronic kidney disease), stage III   Esophagitis    1. AKI - due to poor PO intake secondary to esophagitis 1. Cont IVF 2. Strict intake and output 3. Repeat BMP in AM 2. Esophagitis - 1. Ongoing N/V, no more hematemesis at the moment though 2. HGB seems stable 3. Repeat HGB in AM 4. Cont PPI 5. Got famotidine and PPI in ED 6. If needed, add carafate. 7. zofran PRN 8. Consider IP GI consult, wont call yet though. 3. DM2 - 1. Holding sulfonylurea and lantus 2. Sensitive SSI Q4H 4. HTN - 1. Cont home BP meds 5. HLD - cont statin  DVT prophylaxis: SCDs Code Status:  Full Family Communication: No family in room Disposition Plan: Home after AKI improved and tolerating POs Consults called: None Admission status: Place in 67    Jhoselin Crume, [place] [facility]  How to contact the [facility] Attending  or Consulting provider [place] or covering provider during after hours [place], for this patient?  1. Check the care team in [facility] and look for a) attending/consulting [facility] provider listed and b) the [facility] team listed 2. Log into [email or web address]  Amion Physician Scheduling and messaging for groups and whole hospitals  On call and physician scheduling software for group practices, residents, hospitalists and other medical providers for call, clinic, rotation and shift schedules. On[employer] is a hospital-wide system for scheduling doctors and paging doctors on call. EasyPlot is for scientific plotting and data analysis.  [email or web address]  and use [facility]'s universal password to access. If you do not have the password, please contact the hospital operator.  3. Locate the [facility] provider you are looking for under [facility] and page to a number that you can be directly reached. 4. If you still have difficulty reaching the provider, please page the [facility] (Director on Call) for the Hospitalists listed on amion for assistance.  01/06/2020, 8:35 PM

## 2020-01-06 NOTE — ED Provider Notes (Signed)
Care handoff received from Carlisle Cater PA-C at shift change please see previous provider note for full details of visit.  In short 79 year old male presents today after an episode of unresponsiveness, found to be hypoglycemic by EMS, improved with D10 and glucagon.  Additionally patient was in the emergency room yesterday for emesis, had CT scan which showed esophagitis and he was discharged with PPI and GI consultation.   Physical Exam  BP (!) 158/78   Pulse 81   Temp 98.8 F (37.1 C) (Oral)   Resp (!) 27   Ht 6\' 1"  (1.854 m)   Wt 99.8 kg   SpO2 96%   BMI 29.03 kg/m   Physical Exam Constitutional:      General: He is not in acute distress.    Appearance: Normal appearance. He is well-developed. He is not ill-appearing or diaphoretic.  HENT:     Head: Normocephalic and atraumatic.  Eyes:     General: Vision grossly intact. Gaze aligned appropriately.     Pupils: Pupils are equal, round, and reactive to light.  Neck:     Trachea: Trachea and phonation normal.  Pulmonary:     Effort: Pulmonary effort is normal. No respiratory distress.  Abdominal:     General: There is no distension.     Palpations: Abdomen is soft.     Tenderness: There is no abdominal tenderness. There is no guarding or rebound.  Musculoskeletal:        General: Normal range of motion.     Cervical back: Normal range of motion.  Skin:    General: Skin is warm and dry.  Neurological:     Mental Status: He is alert.     GCS: GCS eye subscore is 4. GCS verbal subscore is 5. GCS motor subscore is 6.     Comments: Speech is clear and goal oriented, follows commands Major Cranial nerves without deficit, no facial droop Moves extremities without ataxia, coordination intact  Psychiatric:        Behavior: Behavior normal.     ED Course/Procedures     Procedures  MDM  Patient reassessed no acute distress.  Reports anxiety and increased nausea and multiple episodes of emesis last night.  Considering slight  worsening of his AKI found yesterday along with worsening leukocytosis, will consult hospitalist for admission.  Family and patient anxious about discharge. ---- 7:52 PM: Discussed case with Dr. Fabio Neighbors, patient accepted to hospitalist service for observation admission   Note: Portions of this report may have been transcribed using voice recognition software. Every effort was made to ensure accuracy; however, inadvertent computerized transcription errors may still be present.    Gari Crown 01/06/20 1955    Hayden Rasmussen, MD 01/07/20 [phone]

## 2020-01-06 NOTE — ED Provider Notes (Signed)
[facility] DEPARTMENT Provider Note   CSN: [id number] Arrival date & time: 01/06/20  1121     History Chief Complaint  Patient presents with  . Hypoglycemia    David Ford is a 79 y.o. male.  Patient with history of chronic kidney disease, diabetes, hyperlipidemia, hypertension --presents to the emergency department today for episode of unresponsiveness in setting of hypoglycemia.  Patient reports that he fell asleep on the couch earlier today, as to not disturb his wife.  He states that a family member found him with snoring respirations and they were unable to wake him.  EMS was called.  It appears he may have vomited while sleeping. They found a blood sugar of 48.  Glucagon was administered as well as 100 cc of D10 by IV.  This improved blood sugar and mentation.  There is no reported seizure history or seizure activity.  Patient did lose control of his bladder but denies biting his tongue.  He has epigastric pain unchanged from yesterday.  Patient states that he last took his diabetes medications yesterday morning.  He is nearly certain, at time of my exam, that he did not take any diabetes medications today including insulin.  Patient was seen in the emergency department yesterday for coffee-ground emesis.  He had a normal hemoglobin and a CT which demonstrated signs of esophagitis.  Patient was started on PPI.  GI was contacted and patient was instructed to follow-up as outpatient.  Patient denies any changes as far as the symptoms are concerned.  He denies fevers, URI symptoms, cough or shortness of breath.  No diarrhea, constipation.  He denies urinary symptoms.        Past Medical History:  Diagnosis Date  . AKI (acute kidney injury) ([place]) 06/18/2015  . Cancer (HCC)    hx of prostate cancer  . Chronic kidney disease   . Diabetes mellitus without complication ([place])   . DKA (diabetic ketoacidoses) ([place]) 06/18/2015  . Glaucoma   . Hyperlipidemia    . Hypertension     Patient Active Problem List   Diagnosis Date Noted  . Dermatitis 08/20/2018  . Glaucoma 01/31/2018  . CKD (chronic kidney disease), stage III 08/03/2017  . Hyperlipidemia associated with type 2 diabetes mellitus ([place]) 06/02/2017  . BPH (benign prostatic hyperplasia) 06/02/2017  . History of prostate cancer 06/02/2017  . Insomnia 06/02/2017  . Hypertension associated with diabetes ([place]) 06/17/2015  . DM type 2, uncontrolled, with renal complications ([place]) 99/35/7017    Past Surgical History:  Procedure Laterality Date  . AMPUTATION Left 07/03/2015   Procedure: Left Foot 1st and 2nd Ray Amputation;  Surgeon: Newt Minion, MD;  Location: [facility];  Service: Orthopedics;  Laterality: Left;  . BASCILIC VEIN TRANSPOSITION Left 07/14/2015   Procedure: BRACHIOBASILIC VEIN TRANSPOSITION  ;  Surgeon: Elam Dutch, MD;  Location: [facility];  Service: Vascular;  Laterality: Left;  . IRIDOTOMY / IRIDECTOMY    . prostate seeds         Family History  Problem Relation Age of Onset  . Diabetes Mother   . Hypertension Mother   . Stroke Mother   . Diabetes Sister   . Glaucoma Sister   . Diabetes Brother   . Glaucoma Brother   . Amblyopia Neg Hx   . Blindness Neg Hx   . Cataracts Neg Hx   . Macular degeneration Neg Hx   . Retinal detachment Neg Hx   . Strabismus Neg Hx   .  Retinitis pigmentosa Neg Hx     Social History   Tobacco Use  . Smoking status: Never Smoker  . Smokeless tobacco: Never Used  Vaping Use  . Vaping Use: Never used  Substance Use Topics  . Alcohol use: Yes    Comment: once a week  . Drug use: No    Home Medications Prior to Admission medications   Medication Sig Start Date End Date Taking? Authorizing Provider  amLODipine (NORVASC) 10 MG tablet TAKE 1 TABLET EVERY DAY 12/27/19   [name], MD  atorvastatin (LIPITOR) 10 MG tablet TAKE 1 TABLET EVERY DAY 12/04/19   [name], MD  azelastine (ASTELIN) 0.1 % nasal spray Place 2  sprays into both nostrils 2 (two) times daily. 05/09/19   [name], MD  BD PEN NEEDLE NANO U/F 32G X 4 MM MISC USE WITH PEN INJECTOR DAILY 01/03/20   [name], MD  Blood Glucose Monitoring Suppl (TRUE METRIX AIR GLUCOSE METER) w/Device KIT  09/21/17   [provider]  brimonidine (ALPHAGAN) 0.2 % ophthalmic solution  06/01/18   [provider]  carvedilol (COREG) 3.125 MG tablet TAKE 1 TABLET TWICE DAILY WITH MEALS 09/18/19   [name], MD  dorzolamide-timolol (COSOPT) 22.3-6.8 MG/ML ophthalmic solution  04/02/19   [provider]  fluticasone (FLONASE) 50 MCG/ACT nasal spray Place 2 sprays into both nostrils daily. 07/24/18   [name], MD  glimepiride (AMARYL) 4 MG tablet TAKE 1 TABLET TWICE DAILY 01/03/20   [name], MD  glucose blood (TRUE METRIX BLOOD GLUCOSE TEST) test strip CHECK BLOOD SUGAR FOUR TIMES DAILY  AND AS NEEDED 10/09/19   [name], MD  Insulin Glargine (LANTUS) 100 UNIT/ML Solostar Pen Inject 35-50 Units into the skin daily. 07/17/19   [name], MD  Insulin Pen Needle (PEN NEEDLES) 32G X 5 MM MISC 1 application by Does not apply route 4 (four) times daily as needed (to inject insulin). 08/03/17   [name], MD  latanoprost (XALATAN) 0.005 % ophthalmic solution INSTILL 1 DROP INTO BOTH EYES AT BEDTIME 03/29/18   [name], MD  pantoprazole (PROTONIX) 40 MG tablet Take 1 tablet (40 mg total) by mouth daily. 01/05/20 02/04/20  Couture, Cortni S, PA-C  tamsulosin (FLOMAX) 0.4 MG CAPS capsule TAKE 1 CAPSULE EVERY DAY 09/18/19   [name], MD  traZODone (DESYREL) 150 MG tablet TAKE 2 TABLETS AT BEDTIME 12/27/19   [name], MD  triamcinolone ointment (KENALOG) 0.5 % APPLY TOPICALLY 2 TIMES DAILY 12/24/19   [name], MD  TRUEplus Lancets 33G MISC CHECK BLOOD SUGAR FOUR TIMES DAILY  AND AS NEEDED 10/04/19   [name], MD  Vitamin D, Cholecalciferol, 1000 units CAPS Take 1,000 Units by mouth daily.     [provider]    Allergies    Patient has no known allergies.  Review of Systems   Review of Systems  Constitutional: Negative for fever.  HENT: Negative for rhinorrhea and sore throat.   Eyes: Negative for redness.  Respiratory: Negative for cough and shortness of breath.   Cardiovascular: Negative for leg swelling.  Gastrointestinal: Positive for abdominal pain (epigastric/chest). Negative for diarrhea, nausea and vomiting.  Genitourinary: Positive for enuresis. Negative for dysuria.  Musculoskeletal: Negative for myalgias.  Skin: Negative for rash.  Neurological: Negative for headaches.  Psychiatric/Behavioral: Positive for confusion (resolved).    Physical Exam Updated Vital Signs BP 115/73  Pulse 61   Temp 98.8 F (37.1 C) (Oral)   Resp 16   Ht 6' 1"  (1.854 m)   Wt 99.8 kg   SpO2 95%   BMI 29.03 kg/m   Physical Exam Vitals and nursing note reviewed.  Constitutional:      Appearance: He is well-developed.  HENT:     Head: Normocephalic and atraumatic.     Nose: No congestion.     Mouth/Throat:     Pharynx: No posterior oropharyngeal erythema.     Comments: No tongue biting seen.  Eyes:     General:        Right eye: No discharge.        Left eye: No discharge.     Conjunctiva/sclera: Conjunctivae normal.  Cardiovascular:     Rate and Rhythm: Normal rate and regular rhythm.     Heart sounds: Normal heart sounds.  Pulmonary:     Effort: Pulmonary effort is normal.     Breath sounds: Normal breath sounds.  Abdominal:     Palpations: Abdomen is soft.     Tenderness: There is no abdominal tenderness. There is no guarding or rebound.     Comments: No abd tenderness on exam.   Musculoskeletal:     Cervical back: Normal range of motion and neck supple.     Right lower leg: No edema.     Left lower leg: No edema.  Skin:    General: Skin is warm and dry.  Neurological:     Mental Status: He is alert.     ED Results / Procedures /  Treatments   Labs (all labs ordered are listed, but only abnormal results are displayed) Labs Reviewed  COMPREHENSIVE METABOLIC PANEL - Abnormal; Notable for the following components:      Result Value   CO2 21 (*)    Glucose, Bld 102 (*)    BUN 30 (*)    Creatinine, Ser 2.80 (*)    GFR calc non Af Amer 21 (*)    GFR calc Af Amer 24 (*)    All other components within normal limits  CBC - Abnormal; Notable for the following components:   WBC 18.7 (*)    All other components within normal limits  URINALYSIS, ROUTINE W REFLEX MICROSCOPIC - Abnormal; Notable for the following components:   APPearance HAZY (*)    Hgb urine dipstick MODERATE (*)    Protein, ur >=300 (*)    Bacteria, UA RARE (*)    All other components within normal limits  CBG MONITORING, ED - Abnormal; Notable for the following components:   Glucose-Capillary 124 (*)    All other components within normal limits  CBG MONITORING, ED - Abnormal; Notable for the following components:   Glucose-Capillary 69 (*)    All other components within normal limits  CBG MONITORING, ED - Abnormal; Notable for the following components:   Glucose-Capillary 114 (*)    All other components within normal limits  CBG MONITORING, ED - Abnormal; Notable for the following components:   Glucose-Capillary 159 (*)    All other components within normal limits  LIPASE, BLOOD  CBG MONITORING, ED  CBG MONITORING, ED  CBG MONITORING, ED  CBG MONITORING, ED  CBG MONITORING, ED    EKG EKG Interpretation  Date/Time:  Monday January 06 2020 11:27:01 EDT Ventricular Rate:  94 PR Interval:  178 QRS Duration: 142 QT Interval:  400 QTC Calculation: 500 R Axis:     Text Interpretation:  Normal sinus rhythm Left bundle branch block Abnormal ECG No significant change since last tracing Confirmed by Dorie Rank [phone]) on 01/06/2020 4:07:04 PM   Radiology CT ABDOMEN PELVIS WO CONTRAST  Result Date: 01/05/2020 CLINICAL DATA:  Hematemesis nausea  vomiting EXAM: CT ABDOMEN AND PELVIS WITHOUT CONTRAST TECHNIQUE: Multidetector CT imaging of the abdomen and pelvis was performed following the standard protocol without IV contrast. COMPARISON:  June 19, 2015 FINDINGS: Lower chest: The visualized heart size within normal limits. No pericardial fluid/thickening. The visualized portions of the lungs are clear. Hepatobiliary: Although limited due to the lack of intravenous contrast, normal in appearance without gross focal abnormality. A small amount of sludge/ stones are seen within the gallbladder. Pancreas:  Unremarkable.  No surrounding inflammatory changes. Spleen: Normal in size. Although limited due to the lack of intravenous contrast, normal in appearance. Adrenals/Urinary Tract: Mild thickening of the bilateral adrenal glands perinephric stranding changes are seen. No renal or collecting system calculi. No hydronephrosis. The bladder is unremarkable. There Stomach/Bowel: There is a small hiatal hernia with diffuse wall thickening seen around the distal esophagus. The remainder of the stomach is unremarkable. Small bowel, and colon are normal in appearance. No inflammatory changes or obstructive findings. There is scattered colonic diverticula are noted without diverticulitis. Appendix is normal. Vascular/Lymphatic: There are no enlarged abdominal or pelvic lymph nodes. Scattered aortic atherosclerotic calcifications are seen without aneurysmal dilatation. Reproductive: Radiation prostate seeds are noted probably the small amount of fluid is seen surrounding the right lower quadrant prosthetic polyp. Other: No evidence of abdominal wall mass or hernia. Musculoskeletal: No acute or significant osseous findings. IMPRESSION: Findings which could be suggestive of distal esophagitis. Cholelithiasis without evidence of acute cholecystitis. Diverticulosis without diverticulitis. Aortic Atherosclerosis (ICD10-I70.0). Electronically Signed   By: Prudencio Pair M.D.    On: 01/05/2020 16:39   CT Head Wo Contrast  Result Date: 01/05/2020 CLINICAL DATA:  Altered mental status for the past 3 days. EXAM: CT HEAD WITHOUT CONTRAST TECHNIQUE: Contiguous axial images were obtained from the base of the skull through the vertex without intravenous contrast. COMPARISON:  None. FINDINGS: Brain: Mildly enlarged ventricles and cortical sulci. Mild-to-moderate patchy white matter low density in both cerebral hemispheres. No intracranial hemorrhage, mass lesion or CT evidence of acute infarction. Vascular: No hyperdense vessel or unexpected calcification. Skull: Normal. Negative for fracture or focal lesion. Sinuses/Orbits: Status post bilateral cataract extraction. Small left maxillary sinus retention cyst. Other: None. IMPRESSION: 1. No acute abnormality. 2. Mild to moderate diffuse cerebral and cerebellar atrophy. 3. Mild to moderate chronic small vessel white matter ischemic changes in both cerebral hemispheres. Electronically Signed   By: Claudie Revering M.D.   On: 01/05/2020 18:00    Procedures Procedures (including critical care time)  Medications Ordered in ED Medications  sodium chloride 0.9 % bolus 500 mL (500 mLs Intravenous New Bag/Given 01/06/20 1720)  pantoprazole (PROTONIX) EC tablet 40 mg (40 mg Oral Given 01/06/20 1718)    ED Course  I have reviewed the triage vital signs and the nursing notes.  Pertinent labs & imaging results that were available during my care of the patient were reviewed by me and considered in my medical decision making (see chart for details).  Patient seen and examined.  Patient here for unresponsive episode, suspect secondary to hypoglycemia.  Unclear why patient is hypoglycemic today, especially if he has not taken his diabetes medication since yesterday morning.  Patient is currently at his baseline except for epigastric and lower chest pain,  unchanged from evaluation yesterday for coffee-ground emesis.  CBG 124>94>69 during ED stay --  currently drinking orange juice. Will continue to monitor.   Reviewed CT from yesterday.  Today white blood cell count is slightly higher, creatinine is slightly higher.  Fluid bolus ordered.  UA without signs of infection.  Vital signs reviewed and are as follows: BP 115/73   Pulse 61   Temp 98.8 F (37.1 C) (Oral)   Resp 16   Ht 6' 1"  (1.854 m)   Wt 99.8 kg   SpO2 95%   BMI 29.03 kg/m   4:25 PM patient seen by Dr. Tomi Bamberger earlier.  Patient's family now bedside.  Discussed and confirmed history with them.  Will give dose of Protonix.  Awaiting food tray.  Blood sugar is 114 after recent oral intake.  We will continue to monitor.  4:58 PM CBG 90.   6:43 PM Patient has eaten. Blood sugar in 150's. Will recheck between 7:30p and 8p. Dispo to be determined based on stability of blood sugar.   Rx for zofran and protonix sent to local pharmacy. Pt states he has not yet received protonix rx yesterday 2/2 being a mail order pharmacy.     MDM Rules/Calculators/A&P                          Patient presents after having an episode of hypoglycemia this morning.  Recent diagnosis of esophagitis.  He has not yet started taking the Protonix prescribed yesterday.  He was given a dose here in the emergency department today.  Patient has been able to eat and drink here without vomiting.  Blood sugar initially trended down into the 60s but rebounded with oral intake.  Currently awaiting assessment of blood sugars after eating a meal. Would advise holding sulfonylurea until esophagitis is appropriately treated.     Final Clinical Impression(s) / ED Diagnoses Final diagnoses:  Hypoglycemia  Esophagitis    Rx / DC Orders ED Discharge Orders         Ordered    pantoprazole (PROTONIX) 20 MG tablet  Daily     Discontinue  Reprint     01/06/20 1840    ondansetron (ZOFRAN ODT) 4 MG disintegrating tablet  Every 8 hours PRN     Discontinue  Reprint     01/06/20 1840           Carlisle Cater,  PA-C 01/06/20 1853    Dorie Rank, MD 01/07/20 [phone]

## 2020-01-06 NOTE — ED Notes (Signed)
Lunch Tray Ordered @ [address] [place], [place] called by Levada Dy

## 2020-01-06 NOTE — ED Triage Notes (Signed)
Pt BIB [facility] for eval of hypoglycemia. EMS reports granddaughter found on couch unresponsive w/ snoring respirations. CBG 48 on EMS arrival. Here yesterday for possible GI bleed, but d/c'd home w/ close f/u. Unsure if he took his insulin this AM. Rec'd 1mg  glucagon to L delt and then 100cc D10 through 20G L hand.

## 2020-01-07 ENCOUNTER — Observation Stay (HOSPITAL_COMMUNITY): Payer: Medicare HMO

## 2020-01-07 DIAGNOSIS — N179 Acute kidney failure, unspecified: Secondary | ICD-10-CM | POA: Diagnosis present

## 2020-01-07 DIAGNOSIS — Z20822 Contact with and (suspected) exposure to covid-19: Secondary | ICD-10-CM | POA: Diagnosis present

## 2020-01-07 DIAGNOSIS — N184 Chronic kidney disease, stage 4 (severe): Secondary | ICD-10-CM | POA: Diagnosis present

## 2020-01-07 DIAGNOSIS — R569 Unspecified convulsions: Secondary | ICD-10-CM | POA: Diagnosis not present

## 2020-01-07 DIAGNOSIS — I129 Hypertensive chronic kidney disease with stage 1 through stage 4 chronic kidney disease, or unspecified chronic kidney disease: Secondary | ICD-10-CM | POA: Diagnosis present

## 2020-01-07 DIAGNOSIS — R32 Unspecified urinary incontinence: Secondary | ICD-10-CM | POA: Diagnosis present

## 2020-01-07 DIAGNOSIS — Z833 Family history of diabetes mellitus: Secondary | ICD-10-CM | POA: Diagnosis not present

## 2020-01-07 DIAGNOSIS — Z8546 Personal history of malignant neoplasm of prostate: Secondary | ICD-10-CM | POA: Diagnosis not present

## 2020-01-07 DIAGNOSIS — E1122 Type 2 diabetes mellitus with diabetic chronic kidney disease: Secondary | ICD-10-CM | POA: Diagnosis present

## 2020-01-07 DIAGNOSIS — Z89422 Acquired absence of other left toe(s): Secondary | ICD-10-CM | POA: Diagnosis not present

## 2020-01-07 DIAGNOSIS — E1169 Type 2 diabetes mellitus with other specified complication: Secondary | ICD-10-CM | POA: Diagnosis present

## 2020-01-07 DIAGNOSIS — E785 Hyperlipidemia, unspecified: Secondary | ICD-10-CM

## 2020-01-07 DIAGNOSIS — E1165 Type 2 diabetes mellitus with hyperglycemia: Secondary | ICD-10-CM | POA: Diagnosis present

## 2020-01-07 DIAGNOSIS — Z794 Long term (current) use of insulin: Secondary | ICD-10-CM | POA: Diagnosis not present

## 2020-01-07 DIAGNOSIS — Z79899 Other long term (current) drug therapy: Secondary | ICD-10-CM | POA: Diagnosis not present

## 2020-01-07 DIAGNOSIS — E11649 Type 2 diabetes mellitus with hypoglycemia without coma: Secondary | ICD-10-CM | POA: Diagnosis present

## 2020-01-07 DIAGNOSIS — R55 Syncope and collapse: Secondary | ICD-10-CM

## 2020-01-07 DIAGNOSIS — E860 Dehydration: Secondary | ICD-10-CM | POA: Diagnosis present

## 2020-01-07 DIAGNOSIS — N189 Chronic kidney disease, unspecified: Secondary | ICD-10-CM | POA: Diagnosis present

## 2020-01-07 DIAGNOSIS — E162 Hypoglycemia, unspecified: Secondary | ICD-10-CM | POA: Diagnosis present

## 2020-01-07 DIAGNOSIS — K209 Esophagitis, unspecified without bleeding: Secondary | ICD-10-CM

## 2020-01-07 DIAGNOSIS — Z8249 Family history of ischemic heart disease and other diseases of the circulatory system: Secondary | ICD-10-CM | POA: Diagnosis not present

## 2020-01-07 DIAGNOSIS — K920 Hematemesis: Secondary | ICD-10-CM | POA: Diagnosis present

## 2020-01-07 DIAGNOSIS — K2091 Esophagitis, unspecified with bleeding: Secondary | ICD-10-CM | POA: Diagnosis present

## 2020-01-07 DIAGNOSIS — Z823 Family history of stroke: Secondary | ICD-10-CM | POA: Diagnosis not present

## 2020-01-07 LAB — CBG MONITORING, ED
Glucose-Capillary: 184 mg/dL — ABNORMAL HIGH (ref 70–99)
Glucose-Capillary: 141 mg/dL — ABNORMAL HIGH (ref 70–99)
Glucose-Capillary: 158 mg/dL — ABNORMAL HIGH (ref 70–99)
Glucose-Capillary: 346 mg/dL — ABNORMAL HIGH (ref 70–99)
Glucose-Capillary: 341 mg/dL — ABNORMAL HIGH (ref 70–99)

## 2020-01-07 LAB — ECHOCARDIOGRAM COMPLETE
Area-P 1/2: 2.11 cm2
Height: 73 in
S' Lateral: 3.5 cm
Weight: 3520 oz

## 2020-01-07 LAB — BASIC METABOLIC PANEL
Anion gap: 8 (ref 5–15)
BUN: 31 mg/dL — ABNORMAL HIGH (ref 8–23)
CO2: 22 mmol/L (ref 22–32)
Calcium: 8.4 mg/dL — ABNORMAL LOW (ref 8.9–10.3)
Chloride: 109 mmol/L (ref 98–111)
Creatinine, Ser: 2.31 mg/dL — ABNORMAL HIGH (ref 0.61–1.24)
GFR calc Af Amer: 30 mL/min — ABNORMAL LOW (ref >60)
GFR calc non Af Amer: 26 mL/min — ABNORMAL LOW (ref >60)
Glucose, Bld: 135 mg/dL — ABNORMAL HIGH (ref 70–99)
Potassium: 4.1 mmol/L (ref 3.5–5.1)
Sodium: 139 mmol/L (ref 135–145)

## 2020-01-07 LAB — CBC
HCT: 37.9 % — ABNORMAL LOW (ref 39.0–52.0)
Hemoglobin: 12 g/dL — ABNORMAL LOW (ref 13.0–17.0)
MCH: 28.2 pg (ref 26.0–34.0)
MCHC: 31.7 g/dL (ref 30.0–36.0)
MCV: 89.2 fL (ref 80.0–100.0)
Platelets: 239 10*3/uL (ref 150–400)
RBC: 4.25 MIL/uL (ref 4.22–5.81)
RDW: 14.5 % (ref 11.5–15.5)
WBC: 12.5 10*3/uL — ABNORMAL HIGH (ref 4.0–10.5)
nRBC: 0 % (ref 0.0–0.2)

## 2020-01-07 LAB — GLUCOSE, CAPILLARY
Glucose-Capillary: 348 mg/dL — ABNORMAL HIGH (ref 70–99)
Glucose-Capillary: 316 mg/dL — ABNORMAL HIGH (ref 70–99)

## 2020-01-07 LAB — CK: Total CK: 629 U/L — ABNORMAL HIGH (ref 49–397)

## 2020-01-07 LAB — HEMOGLOBIN A1C
Hgb A1c MFr Bld: 7 % — ABNORMAL HIGH (ref 4.8–5.6)
Mean Plasma Glucose: 154.2 mg/dL

## 2020-01-07 LAB — SARS CORONAVIRUS 2 BY RT PCR (HOSPITAL ORDER, PERFORMED IN ~~LOC~~ HOSPITAL LAB): SARS Coronavirus 2: NEGATIVE

## 2020-01-07 MED ORDER — SODIUM CHLORIDE 0.9 % IV SOLN: INTRAVENOUS | Status: DC

## 2020-01-07 MED ORDER — PANTOPRAZOLE SODIUM 40 MG PO TBEC
40.0000 mg | DELAYED_RELEASE_TABLET | Freq: Two times a day (BID) | ORAL | Status: DC
  Administered 2020-01-07 – 2020-01-08 (×2): 40 mg via ORAL
  Filled 2020-01-07 (×2): qty 1

## 2020-01-07 MED ORDER — CHLORHEXIDINE GLUCONATE CLOTH 2 % EX PADS
6.0000 | MEDICATED_PAD | Freq: Every day | CUTANEOUS | Status: DC
  Administered 2020-01-07: 6 via TOPICAL

## 2020-01-07 NOTE — Progress Notes (Addendum)
PROGRESS NOTE  David Ford YNW:[id number] DOB: June 17, 1941 DOA: 01/06/2020 PCP: [name], MD  Brief History:  79 year old male with a history of prostate cancer, diabetes mellitus type 2, hypertension, hyperlipidemia, CKD stage IV presenting with an episode of unresponsiveness.  The patient initially visited emergency department on 01/05/2020 with coffee-ground emesis.  He had a CT of the abdomen and pelvis on that day showing a small hiatus hernia with diffuse wall thickening seen around the distal esophagus consistent with esophagitis.  The patient was sent home in stable condition with Protonix and Zofran.  He was given contact information for [facility] after discussion with Dr. Fuller Plan.  The patient returned on 01/06/2020 with an unresponsive episode.  Apparently the patient's wife found the patient snoring and was unable to wake him up.  Apparently, she performed CPR for a period of time the duration of which is unclear.  Upon EMS arrival, the patient was noted to have a CBG of 48.  He was given D10 and glucagon.  The patient denied any prodromal symptoms including chest pain, shortness breath, palpitations, dizziness, visual disturbance.  In the emergency department, the patient was noted to have a serum creatinine of 2.0 with WBC 18.7.  As a result, the patient was admitted for his acute kidney injury and leukocytosis.  Assessment/Plan: Acute on chronic renal failure--CKD stage IV -Baseline creatinine 1.8-2.0 -Presented with serum creatinine 2.0 -Secondary to volume depletion (from vomiting) although the patient may certainly have progression of his underlying CKD -Improving with IV fluid hydration -Continue IV fluids for another 24 hours  Syncope/unresponsiveness/Altered Mental Status -Likely secondary to hypoglycemia -Echocardiogram -Remain on telemetry -EEG--the patient did have an episode of bladder incontinence -mental status improving with improved  CBGs  Hypoglycemia -Secondary to prolonged half-life of glimepiride in the setting of CKD -Discontinue glimepiride -The patient is amenable to switching to a different agent at the time of discharge -Continue to monitor CBGs  Diabetes mellitus type 2, controlled -01/07/2020 hemoglobin A1c 7.0 -NovoLog sliding scale  Essential hypertension -Continue carvedilol  Hematemesis/esophagitis -The patient's hemoglobin was stable prior to IV fluid dilution -Advance diet as tolerated -Continue Protonix twice daily -Outpatient follow-up with [facility]  Elevated CPK -Likely secondary to CPR performed by the patient's spouse -Continue IV fluids -Repeat CPK in the a.m -hold statin temporarily.     Status is: Observation  The patient will require care spanning > 2 midnights and should be moved to inpatient because: Ongoing diagnostic testing needed not appropriate for outpatient work up  Dispo: The patient is from: Home              Anticipated d/c is to: Home              Anticipated d/c date is: 1 day--likely 01/08/20              Patient currently is not medically stable to d/c.        Family Communication:  no Family at bedside  Consultants:  none  Code Status:  FULL  DVT Prophylaxis:  SCDs   Procedures: As Listed in Progress Note Above  Antibiotics: None      Subjective:  Patient denies fevers, chills, headache, chest pain, dyspnea, nausea, vomiting, diarrhea, abdominal pain, dysuria, hematuria, hematochezia, and melena.  Objective: Vitals:   01/06/20 2230 01/07/20 0454 01/07/20 0637 01/07/20 0739  BP: (!) 163/71 (!) 178/73 (!) 172/70 (!) 159/60  Pulse: 70 96 66  63  Resp: (!) 25 16 20 18   Temp:  98.3 F (36.8 C) 99 F (37.2 C)   TempSrc:  Oral Oral   SpO2: 95% 98% 97% (!) 63%  Weight:      Height:       No intake or output data in the 24 hours ending 01/07/20 0825 Weight change:  Exam:   General:  Pt is alert, follows commands appropriately,  not in acute distress  HEENT: No icterus, No thrush, No neck mass, [place]/AT  Cardiovascular: RRR, S1/S2, no rubs, no gallops  Respiratory: CTA bilaterally, no wheezing, no crackles, no rhonchi  Abdomen: Soft/+BS, non tender, non distended, no guarding  Extremities: No edema, No lymphangitis, No petechiae, No rashes, no synovitis   Data Reviewed: I have personally reviewed following labs and imaging studies Basic Metabolic Panel: Recent Labs  Lab 01/05/20 1048 01/06/20 1148 01/07/20 0650  NA 141 141 139  K 3.9 3.8 4.1  CL 108 107 109  CO2 20* 21* 22  GLUCOSE 85 102* 135*  BUN 24* 30* 31*  CREATININE 2.45* 2.80* 2.31*  CALCIUM 9.3 9.0 8.4*   Liver Function Tests: Recent Labs  Lab 01/05/20 1048 01/06/20 1148  AST 27 38  ALT 16 17  ALKPHOS 79 75  BILITOT 1.0 1.0  PROT 7.2 7.2  ALBUMIN 3.6 3.5   Recent Labs  Lab 01/06/20 1148  LIPASE 18   No results for input(s): AMMONIA in the last 168 hours. Coagulation Profile: No results for input(s): INR, PROTIME in the last 168 hours. CBC: Recent Labs  Lab 01/05/20 1048 01/06/20 1148 01/07/20 0650  WBC 16.6* 18.7* 12.5*  HGB 13.8 14.1 12.0*  HCT 41.8 43.0 37.9*  MCV 86.7 88.3 89.2  PLT 319 293 239   Cardiac Enzymes: Recent Labs  Lab 01/07/20 0650  CKTOTAL 629*   BNP: Invalid input(s): POCBNP CBG: Recent Labs  Lab 01/06/20 1957 01/06/20 2106 01/06/20 2329 01/07/20 0219 01/07/20 0446  GLUCAP 306* 318* 286* 184* 141*   HbA1C: Recent Labs    01/07/20 0650  HGBA1C 7.0*   Urine analysis:    Component Value Date/Time   COLORURINE YELLOW 01/06/2020 1233   APPEARANCEUR HAZY (A) 01/06/2020 1233   LABSPEC 1.020 01/06/2020 1233   PHURINE 5.0 01/06/2020 1233   GLUCOSEU NEGATIVE 01/06/2020 1233   HGBUR MODERATE (A) 01/06/2020 1233   BILIRUBINUR NEGATIVE 01/06/2020 1233   KETONESUR NEGATIVE 01/06/2020 1233   PROTEINUR >=300 (A) 01/06/2020 1233   NITRITE NEGATIVE 01/06/2020 1233   LEUKOCYTESUR NEGATIVE  01/06/2020 1233   Sepsis Labs: @LABRCNTIP (procalcitonin:4,lacticidven:4) ) Recent Results (from the past 240 hour(s))  SARS Coronavirus 2 by RT PCR (hospital order, performed in [place] hospital lab) Nasopharyngeal Nasopharyngeal Swab     Status: None   Collection Time: 01/06/20 11:32 PM   Specimen: Nasopharyngeal Swab  Result Value Ref Range Status   SARS Coronavirus 2 NEGATIVE NEGATIVE Final    Comment: (NOTE) SARS-CoV-2 target nucleic acids are NOT DETECTED.  The SARS-CoV-2 RNA is generally detectable in upper and lower respiratory specimens during the acute phase of infection. The lowest concentration of SARS-CoV-2 viral copies this assay can detect is 250 copies / mL. A negative result does not preclude SARS-CoV-2 infection and should not be used as the sole basis for treatment or other patient management decisions.  A negative result may occur with improper specimen collection / handling, submission of specimen other than nasopharyngeal swab, presence of viral mutation(s) within the areas targeted by this assay,  and inadequate number of viral copies (<250 copies / mL). A negative result must be combined with clinical observations, patient history, and epidemiological information.  Fact Sheet for Patients:   StrictlyIdeas.no  Fact Sheet for Healthcare Providers: BankingDealers.co.za  This test is not yet approved or  cleared by the [place] FDA and has been authorized for detection and/or diagnosis of SARS-CoV-2 by FDA under an Emergency Use Authorization (EUA).  This EUA will remain in effect (meaning this test can be used) for the duration of the COVID-19 declaration under Section 564(b)(1) of the Act, 21 U.S.C. section 360bbb-3(b)(1), unless the authorization is terminated or revoked sooner.  Performed at [facility], [place] [address], [place], [place] [address]      Scheduled Meds: . amLODipine  10 mg  Oral Daily  . carvedilol  3.125 mg Oral BID WC  . fluticasone  2 spray Each Nare Daily  . insulin aspart  0-9 Units Subcutaneous Q4H  . latanoprost  1 drop Both Eyes QHS  . pantoprazole  40 mg Oral BID  . tamsulosin  0.4 mg Oral Daily  . traZODone  300 mg Oral QHS   Continuous Infusions: . sodium chloride      Procedures/Studies: CT ABDOMEN PELVIS WO CONTRAST  Result Date: 01/05/2020 CLINICAL DATA:  Hematemesis nausea vomiting EXAM: CT ABDOMEN AND PELVIS WITHOUT CONTRAST TECHNIQUE: Multidetector CT imaging of the abdomen and pelvis was performed following the standard protocol without IV contrast. COMPARISON:  June 19, 2015 FINDINGS: Lower chest: The visualized heart size within normal limits. No pericardial fluid/thickening. The visualized portions of the lungs are clear. Hepatobiliary: Although limited due to the lack of intravenous contrast, normal in appearance without gross focal abnormality. A small amount of sludge/ stones are seen within the gallbladder. Pancreas:  Unremarkable.  No surrounding inflammatory changes. Spleen: Normal in size. Although limited due to the lack of intravenous contrast, normal in appearance. Adrenals/Urinary Tract: Mild thickening of the bilateral adrenal glands perinephric stranding changes are seen. No renal or collecting system calculi. No hydronephrosis. The bladder is unremarkable. There Stomach/Bowel: There is a small hiatal hernia with diffuse wall thickening seen around the distal esophagus. The remainder of the stomach is unremarkable. Small bowel, and colon are normal in appearance. No inflammatory changes or obstructive findings. There is scattered colonic diverticula are noted without diverticulitis. Appendix is normal. Vascular/Lymphatic: There are no enlarged abdominal or pelvic lymph nodes. Scattered aortic atherosclerotic calcifications are seen without aneurysmal dilatation. Reproductive: Radiation prostate seeds are noted probably the small  amount of fluid is seen surrounding the right lower quadrant prosthetic polyp. Other: No evidence of abdominal wall mass or hernia. Musculoskeletal: No acute or significant osseous findings. IMPRESSION: Findings which could be suggestive of distal esophagitis. Cholelithiasis without evidence of acute cholecystitis. Diverticulosis without diverticulitis. Aortic Atherosclerosis (ICD10-I70.0). Electronically Signed   By: Prudencio Pair M.D.   On: 01/05/2020 16:39   CT Head Wo Contrast  Result Date: 01/05/2020 CLINICAL DATA:  Altered mental status for the past 3 days. EXAM: CT HEAD WITHOUT CONTRAST TECHNIQUE: Contiguous axial images were obtained from the base of the skull through the vertex without intravenous contrast. COMPARISON:  None. FINDINGS: Brain: Mildly enlarged ventricles and cortical sulci. Mild-to-moderate patchy white matter low density in both cerebral hemispheres. No intracranial hemorrhage, mass lesion or CT evidence of acute infarction. Vascular: No hyperdense vessel or unexpected calcification. Skull: Normal. Negative for fracture or focal lesion. Sinuses/Orbits: Status post bilateral cataract extraction. Small left maxillary sinus retention  cyst. Other: None. IMPRESSION: 1. No acute abnormality. 2. Mild to moderate diffuse cerebral and cerebellar atrophy. 3. Mild to moderate chronic small vessel white matter ischemic changes in both cerebral hemispheres. Electronically Signed   By: Claudie Revering M.D.   On: 01/05/2020 18:00   DG Chest Port 1 View  Result Date: 01/06/2020 CLINICAL DATA:  Hypoglycemia EXAM: PORTABLE CHEST 1 VIEW COMPARISON:  07/30/2017 FINDINGS: The heart size and mediastinal contours are within normal limits. Both lungs are clear. The visualized skeletal structures are unremarkable. IMPRESSION: No active disease. Electronically Signed   By: Donavan Foil M.D.   On: 01/06/2020 15:22    Orson Eva, DO  [facility]  If 7PM-7AM, please contact  night-coverage [email or web address] Password [email or web address] 01/07/2020, 8:25 AM   LOS: 0 days

## 2020-01-07 NOTE — Care Management (Addendum)
ED CM received a call from patient's wife concerning patient She is concerned about recent memory issues along with  Hallucinations. Wife encouraged to share this information with the Medical Provider.  She is asking for [facility] services upon discharge.  CM explained [facility] services and the frequency of the skilled visits to spouse. Offered options of private duty (self- pay)  Wife explained that she works full time and is in school and that there would be no one home with patient.  Patient is coming into the hospital under observation.  Unit [facility] team will continue to follow for transitional care needs.

## 2020-01-07 NOTE — Progress Notes (Signed)
  Echocardiogram 2D Echocardiogram has been performed.  Jennette Dubin 01/07/2020, 11:37 AM

## 2020-01-07 NOTE — Progress Notes (Signed)
EEG complete - results pending 

## 2020-01-07 NOTE — ED Notes (Signed)
Lunch Tray Ordered @ 1045 

## 2020-01-07 NOTE — Procedures (Signed)
Patient Name: Cletis Clack  MRN: [id number]  Epilepsy Attending: Lora Havens  Referring Physician/Provider: Dr Shanon Brow Tat Date: 03/09/2020 Duration: 25.56 mins  Patient history: 79yo M with syncope/unresponsiveness. EEG to evaluate for seizure.  Level of alertness: Awake, drowsy  AEDs during EEG study: None  Technical aspects: This EEG study was done with scalp electrodes positioned according to the 10-20 International system of electrode placement. Electrical activity was acquired at a sampling rate of 500Hz  and reviewed with a high frequency filter of 70Hz  and a low frequency filter of 1Hz . EEG data were recorded continuously and digitally stored.   Description: The posterior dominant rhythm consists of 9 Hz activity of moderate voltage (25-35 uV) seen predominantly in posterior head regions, symmetric and reactive to eye opening and eye closing. Drowsiness was characterized by attenuation of the posterior background rhythm. Hyperventilation and photic stimulation were not performed.     IMPRESSION: This study is within normal limits. No seizures or epileptiform discharges were seen throughout the recording.  Chandrea Zellman Barbra Sarks

## 2020-01-07 NOTE — ED Notes (Signed)
Pt's wife at bedside, pt is eating breakfast

## 2020-01-07 NOTE — Progress Notes (Signed)
David Ford [id number]  Code Status: FULL  David Ford is a 79 y.o. male patient admitted from ED awake, alert - oriented X 4 - no acute distress noted. VSS - Blood pressure (!) 171/79, pulse 73, temperature 98.1 F (36.7 C), temperature source Oral, resp. rate 16, height 6\' 1"  (1.854 m), weight 98.7 kg, SpO2 96 %. no c/o shortness of breath, no c/o chest pain. Orientation to room, and floor completed with information packet given to patient/family. Admission INP armband ID verified with patient/family, and in place.  SR up x 2, fall assessment complete, with patient and family able to verbalize understanding of risk associated with falls, and verbalized understanding to call nsg before up out of bed. Call light within reach, patient able to voice, and demonstrate understanding. Skin, clean-dry- intact without evidence of bruising, or skin tears.  No evidence of skin break down noted on exam.  ?  Will cont to eval and treat per MD orders.  Melonie Florida, RN  01/07/2020 8:11 PM

## 2020-01-08 DIAGNOSIS — N179 Acute kidney failure, unspecified: Secondary | ICD-10-CM

## 2020-01-08 LAB — GLUCOSE, CAPILLARY
Glucose-Capillary: 110 mg/dL — ABNORMAL HIGH (ref 70–99)
Glucose-Capillary: 161 mg/dL — ABNORMAL HIGH (ref 70–99)
Glucose-Capillary: 171 mg/dL — ABNORMAL HIGH (ref 70–99)

## 2020-01-08 LAB — CBC
HCT: 38.6 % — ABNORMAL LOW (ref 39.0–52.0)
Hemoglobin: 12.6 g/dL — ABNORMAL LOW (ref 13.0–17.0)
MCH: 28.7 pg (ref 26.0–34.0)
MCHC: 32.6 g/dL (ref 30.0–36.0)
MCV: 87.9 fL (ref 80.0–100.0)
Platelets: 271 10*3/uL (ref 150–400)
RBC: 4.39 MIL/uL (ref 4.22–5.81)
RDW: 14 % (ref 11.5–15.5)
WBC: 10.9 10*3/uL — ABNORMAL HIGH (ref 4.0–10.5)
nRBC: 0 % (ref 0.0–0.2)

## 2020-01-08 LAB — BASIC METABOLIC PANEL
Anion gap: 7 (ref 5–15)
BUN: 34 mg/dL — ABNORMAL HIGH (ref 8–23)
CO2: 26 mmol/L (ref 22–32)
Calcium: 8.6 mg/dL — ABNORMAL LOW (ref 8.9–10.3)
Chloride: 105 mmol/L (ref 98–111)
Creatinine, Ser: 2.1 mg/dL — ABNORMAL HIGH (ref 0.61–1.24)
GFR calc Af Amer: 34 mL/min — ABNORMAL LOW (ref >60)
GFR calc non Af Amer: 29 mL/min — ABNORMAL LOW (ref >60)
Glucose, Bld: 119 mg/dL — ABNORMAL HIGH (ref 70–99)
Potassium: 4 mmol/L (ref 3.5–5.1)
Sodium: 138 mmol/L (ref 135–145)

## 2020-01-08 LAB — CK: Total CK: 403 U/L — ABNORMAL HIGH (ref 49–397)

## 2020-01-08 MED ORDER — INSULIN ASPART 100 UNIT/ML ~~LOC~~ SOLN: SUBCUTANEOUS | 0 refills | Status: DC

## 2020-01-08 MED ORDER — INSULIN GLARGINE 100 UNIT/ML SOLOSTAR PEN: 45.0000 [IU] | PEN_INJECTOR | Freq: Every day | SUBCUTANEOUS | 0 refills | Status: DC

## 2020-01-08 MED ORDER — INSULIN GLARGINE 100 UNIT/ML ~~LOC~~ SOLN
30.0000 [IU] | Freq: Every day | SUBCUTANEOUS | Status: DC
  Filled 2020-01-08: qty 0.3

## 2020-01-08 MED ORDER — PANTOPRAZOLE SODIUM 40 MG PO TBEC
40.0000 mg | DELAYED_RELEASE_TABLET | Freq: Two times a day (BID) | ORAL | 0 refills | Status: DC
Start: 2020-01-08 — End: 2020-02-26

## 2020-01-08 NOTE — Discharge Summary (Signed)
David Ford OFH:[id number] DOB: Oct 13, 1940 DOA: 01/06/2020  PCP: [name], MD  Admit date: 01/06/2020  Discharge date: 01/08/2020  Admitted From: Home  Disposition:  Home   Recommendations for Outpatient Follow-up:   Follow up with PCP in 1-2 weeks  PCP Please obtain BMP/CBC, 2 view CXR in 1week,  (see Discharge instructions)   PCP Please follow up on the following pending results: Needs outpatient GI follow-up, monitor A1c and CBGs closely.   Home Health: PT, RN Equipment/Devices: None  Consultations: None  Discharge Condition: Stable    CODE STATUS: Full    Diet Recommendation: Heart Healthy Low Carb  Diet Order            Diet Carb Modified Fluid consistency: Thin; Room service appropriate? Yes  Diet effective now                  Chief Complaint  Patient presents with  . Hypoglycemia     Brief history of present illness from the day of admission and additional interim summary    79 year old male with a history of prostate cancer, diabetes mellitus type 2, hypertension, hyperlipidemia, CKD stage IV presenting with an episode of unresponsiveness.  The patient initially visited emergency department on 01/05/2020 with coffee-ground emesis.  He had a CT of the abdomen and pelvis on that day showing a small hiatus hernia with diffuse wall thickening seen around the distal esophagus consistent with esophagitis.  The patient was sent home in stable condition with Protonix and Zofran.  He was given contact information for [facility] after discussion with Dr. Fuller Plan.  The patient returned on 01/06/2020 with an unresponsive episode.  Apparently the patient's wife found the patient snoring and was unable to wake him up.  Apparently, she performed CPR for a period of time the duration of which is unclear.   Upon EMS arrival, the patient was noted to have a CBG of 48.  He was given D10 and glucagon.  The patient denied any prodromal symptoms including chest pain, shortness breath, palpitations, dizziness, visual disturbance.  In the emergency department, the patient was noted to have a serum creatinine of 2.0 with WBC 18.7.  As a result, the patient was admitted for his acute kidney injury and leukocytosis and Hypoglycemia.                                                                 Hospital Course    Acute on chronic renal failure--CKD stage IV -Baseline creatinine 1.8-2.0 -Presented with serum creatinine 2.0 -Secondary to volume depletion (from vomiting) although the patient may certainly have progression of his underlying CKD -Improving with IV fluid hydration -Continue IV fluids for another 24 hours  Syncope/unresponsiveness/Altered Mental Status -Likely secondary to hypoglycemia & dehydration - stble echocardiogram, EEG, now  symptom-free.  Hypoglycemia -Secondary to prolonged half-life of glimepiride in the setting of CKD -Discontinue glimepiride -He is now on Lantus and sliding scale and will be discharged on the same, written instructions provided on checking CBGs QA CHS and following up with PCP in a few days.  Diabetes mellitus type 2, controlled -01/07/2020 hemoglobin A1c 7.0 -Now on combination of Lantus and sliding scale.  Discontinue glimepiride.  Essential hypertension -Continue carvedilol  Hematemesis/esophagitis -The patient's hemoglobin was stable prior to IV fluid dilution -Advance diet as tolerated -Now symptom-free, increase PPI to twice daily and requested to follow outpatient with GI.  Elevated CPK -Likely secondary to CPR performed by the patient's spouse -Continue IV fluids -Much improved.    Discharge diagnosis     Principal Problem:   AKI (acute kidney injury) ([place]) Active Problems:   Hypertension associated with diabetes ([place])   DM type  2, uncontrolled, with renal complications ([facility])   Hyperlipidemia associated with type 2 diabetes mellitus ([facility])   CKD (chronic kidney disease), stage III   Esophagitis   Acute renal failure superimposed on stage 4 chronic kidney disease ([facility])   Syncope and collapse   Hypoglycemia   Acute on chronic renal failure [facility])    Discharge instructions    Discharge Instructions    Discharge instructions   Complete by: As directed    Follow with Primary MD [name], MD in 7 days   Get CBC, CMP  checked next visit within 1 week by Primary MD  Activity: As tolerated with Full fall precautions use walker/cane & assistance as needed  Disposition Home    Diet: Heart Healthy Low carb  Accuchecks 4 times/day, Once in AM empty stomach and then before each meal. Log in all results and show them to your Prim.MD in 3 days. If any glucose reading is under 80 or above 300 call your Prim MD immidiately. Follow Low glucose instructions for glucose under 80 as instructed.   Special Instructions: If you have smoked or chewed Tobacco  in the last 2 yrs please stop smoking, stop any regular Alcohol  and or any Recreational drug use.  On your next visit with your primary care physician please Get Medicines reviewed and adjusted.  Please request your Prim.MD to go over all Hospital Tests and Procedure/Radiological results at the follow up, please get all Hospital records sent to your Prim MD by signing hospital release before you go home.  If you experience worsening of your admission symptoms, develop shortness of breath, life threatening emergency, suicidal or homicidal thoughts you must seek medical attention immediately by calling 911 or calling your MD immediately  if symptoms less severe.  You Must read complete instructions/literature along with all the possible adverse reactions/side effects for all the Medicines you take and that have been prescribed to you. Take any new Medicines after you  have completely understood and accpet all the possible adverse reactions/side effects.   Increase activity slowly   Complete by: As directed       Discharge Medications   Allergies as of 01/08/2020   No Known Allergies     Medication List    STOP taking these medications   glimepiride 4 MG tablet Commonly known as: AMARYL     TAKE these medications   amLODipine 10 MG tablet Commonly known as: NORVASC TAKE 1 TABLET EVERY DAY   atorvastatin 10 MG tablet Commonly known as: LIPITOR TAKE 1 TABLET EVERY DAY   brimonidine 0.2 % ophthalmic solution  Commonly known as: ALPHAGAN Place 1 drop into both eyes in the morning and at bedtime. Original SIG was 1 gtt ou tid  But pt states only taking 1 gtt ou bid   carvedilol 3.125 MG tablet Commonly known as: COREG TAKE 1 TABLET TWICE DAILY WITH MEALS   dorzolamide-timolol 22.3-6.8 MG/ML ophthalmic solution Commonly known as: COSOPT Place 1 drop into both eyes 2 (two) times daily.   fluticasone 50 MCG/ACT nasal spray Commonly known as: FLONASE Place 2 sprays into both nostrils daily.   insulin aspart 100 UNIT/ML injection Commonly known as: NovoLOG Substitute to any brand approved.Before each meal 3 times a day, 140-199 - 2 units, 200-250 - 4 units, 251-299 - 6 units,  300-349 - 8 units,  350 or above 10 units. Dispense syringes and needles as needed, Ok to switch to PEN if approved. DX DM2, Code E11.65   insulin glargine 100 UNIT/ML Solostar Pen Commonly known as: LANTUS Inject 45 Units into the skin daily. Start taking on: January 09, 2020 What changed: how much to take   latanoprost 0.005 % ophthalmic solution Commonly known as: XALATAN INSTILL 1 DROP INTO BOTH EYES AT BEDTIME   ondansetron 4 MG disintegrating tablet Commonly known as: Zofran ODT Take 1 tablet (4 mg total) by mouth every 8 (eight) hours as needed for nausea or vomiting.   pantoprazole 40 MG tablet Commonly known as: PROTONIX Take 1 tablet (40 mg total)  by mouth 2 (two) times daily. What changed: when to take this   Pen Needles 32G X 5 MM Misc 1 application by Does not apply route 4 (four) times daily as needed (to inject insulin). What changed: Another medication with the same name was changed. Make sure you understand how and when to take each.   BD Pen Needle Nano U/F 32G X 4 MM Misc Generic drug: Insulin Pen Needle USE WITH PEN INJECTOR DAILY What changed: See the new instructions.   tamsulosin 0.4 MG Caps capsule Commonly known as: FLOMAX TAKE 1 CAPSULE EVERY DAY   traZODone 150 MG tablet Commonly known as: DESYREL TAKE 2 TABLETS AT BEDTIME   triamcinolone ointment 0.5 % Commonly known as: KENALOG APPLY TOPICALLY 2 TIMES DAILY What changed: See the new instructions.   True Metrix Air Glucose Meter w/Device Kit 1 Device by Other route daily.   True Metrix Blood Glucose Test test strip Generic drug: glucose blood CHECK BLOOD SUGAR FOUR TIMES DAILY  AND AS NEEDED What changed:   how much to take  how to take this  when to take this   TRUEplus Lancets 33G Misc CHECK BLOOD SUGAR FOUR TIMES DAILY  AND AS NEEDED What changed: See the new instructions.   Vitamin D (Cholecalciferol) 25 MCG (1000 UT) Caps Take 1,000 Units by mouth daily.        Follow-up Information    [name], MD Follow up in 2 day(s).   Specialty: Family Medicine Contact information: [place] [address] [place].   Specialty: Emergency Medicine Why: If symptoms worsen Contact information: [address] [id number] [place] [phone]              Major procedures and Radiology Reports - PLEASE review detailed and final reports thoroughly  -      CT ABDOMEN PELVIS WO CONTRAST  Result Date: 01/05/2020 CLINICAL DATA:  Hematemesis nausea vomiting EXAM: CT ABDOMEN AND  PELVIS WITHOUT CONTRAST TECHNIQUE:  Multidetector CT imaging of the abdomen and pelvis was performed following the standard protocol without IV contrast. COMPARISON:  June 19, 2015 FINDINGS: Lower chest: The visualized heart size within normal limits. No pericardial fluid/thickening. The visualized portions of the lungs are clear. Hepatobiliary: Although limited due to the lack of intravenous contrast, normal in appearance without gross focal abnormality. A small amount of sludge/ stones are seen within the gallbladder. Pancreas:  Unremarkable.  No surrounding inflammatory changes. Spleen: Normal in size. Although limited due to the lack of intravenous contrast, normal in appearance. Adrenals/Urinary Tract: Mild thickening of the bilateral adrenal glands perinephric stranding changes are seen. No renal or collecting system calculi. No hydronephrosis. The bladder is unremarkable. There Stomach/Bowel: There is a small hiatal hernia with diffuse wall thickening seen around the distal esophagus. The remainder of the stomach is unremarkable. Small bowel, and colon are normal in appearance. No inflammatory changes or obstructive findings. There is scattered colonic diverticula are noted without diverticulitis. Appendix is normal. Vascular/Lymphatic: There are no enlarged abdominal or pelvic lymph nodes. Scattered aortic atherosclerotic calcifications are seen without aneurysmal dilatation. Reproductive: Radiation prostate seeds are noted probably the small amount of fluid is seen surrounding the right lower quadrant prosthetic polyp. Other: No evidence of abdominal wall mass or hernia. Musculoskeletal: No acute or significant osseous findings. IMPRESSION: Findings which could be suggestive of distal esophagitis. Cholelithiasis without evidence of acute cholecystitis. Diverticulosis without diverticulitis. Aortic Atherosclerosis (ICD10-I70.0). Electronically Signed   By: Prudencio Pair M.D.   On: 01/05/2020 16:39   CT Head Wo Contrast  Result Date:  01/05/2020 CLINICAL DATA:  Altered mental status for the past 3 days. EXAM: CT HEAD WITHOUT CONTRAST TECHNIQUE: Contiguous axial images were obtained from the base of the skull through the vertex without intravenous contrast. COMPARISON:  None. FINDINGS: Brain: Mildly enlarged ventricles and cortical sulci. Mild-to-moderate patchy white matter low density in both cerebral hemispheres. No intracranial hemorrhage, mass lesion or CT evidence of acute infarction. Vascular: No hyperdense vessel or unexpected calcification. Skull: Normal. Negative for fracture or focal lesion. Sinuses/Orbits: Status post bilateral cataract extraction. Small left maxillary sinus retention cyst. Other: None. IMPRESSION: 1. No acute abnormality. 2. Mild to moderate diffuse cerebral and cerebellar atrophy. 3. Mild to moderate chronic small vessel white matter ischemic changes in both cerebral hemispheres. Electronically Signed   By: Claudie Revering M.D.   On: 01/05/2020 18:00   DG Chest Port 1 View  Result Date: 01/06/2020 CLINICAL DATA:  Hypoglycemia EXAM: PORTABLE CHEST 1 VIEW COMPARISON:  07/30/2017 FINDINGS: The heart size and mediastinal contours are within normal limits. Both lungs are clear. The visualized skeletal structures are unremarkable. IMPRESSION: No active disease. Electronically Signed   By: Donavan Foil M.D.   On: 01/06/2020 15:22   EEG adult  Result Date: 01/07/2020 Lora Havens, MD     01/07/2020 12:53 PM Patient Name: David Ford MRN: [id number] Epilepsy Attending: Lora Havens Referring Physician/Provider: Dr Shanon Brow Tat Date: 03/09/2020 Duration: 25.56 mins Patient history: 79yo M with syncope/unresponsiveness. EEG to evaluate for seizure. Level of alertness: Awake, drowsy AEDs during EEG study: None Technical aspects: This EEG study was done with scalp electrodes positioned according to the 10-20 International system of electrode placement. Electrical activity was acquired at a sampling rate of 500Hz  and  reviewed with a high frequency filter of 70Hz  and a low frequency filter of 1Hz . EEG data were recorded continuously and digitally stored. Description: The posterior dominant rhythm consists  of 9 Hz activity of moderate voltage (25-35 uV) seen predominantly in posterior head regions, symmetric and reactive to eye opening and eye closing. Drowsiness was characterized by attenuation of the posterior background rhythm. Hyperventilation and photic stimulation were not performed.   IMPRESSION: This study is within normal limits. No seizures or epileptiform discharges were seen throughout the recording. Lora Havens   ECHOCARDIOGRAM COMPLETE  Result Date: 01/07/2020    ECHOCARDIOGRAM REPORT   Patient Name:   Loanne Drilling Date of Exam: 01/07/2020 Medical Rec #:  [id number]     Height:       73.0 in Accession #:    [id number]    Weight:       220.0 lb Date of Birth:  Sep 15, 1940     BSA:          2.241 m Patient Age:    63 years      BP:           159/60 mmHg Patient Gender: M             HR:           73 bpm. Exam Location:  Inpatient Procedure: 2D Echo Indications:    Syncope R55  History:        Patient has prior history of Echocardiogram examinations, most                 recent 06/18/2015. Risk Factors:Hypertension, Dyslipidemia and                 Diabetes.  Sonographer:    Mikki Santee RDCS (AE) Referring Phys: [phone] DAVID TAT IMPRESSIONS  1. Left ventricular ejection fraction, by estimation, is 55 to 60%. The left ventricle has normal function. The left ventricle has no regional wall motion abnormalities. Left ventricular diastolic parameters are consistent with Grade I diastolic dysfunction (impaired relaxation).  2. Right ventricular systolic function is normal. The right ventricular size is normal. Tricuspid regurgitation signal is inadequate for assessing PA pressure.  3. The mitral valve is grossly normal. Trivial mitral valve regurgitation. No evidence of mitral stenosis.  4. The aortic valve is  tricuspid. Aortic valve regurgitation is not visualized. No aortic stenosis is present.  5. The inferior vena cava is normal in size with greater than 50% respiratory variability, suggesting right atrial pressure of 3 mmHg. Comparison(s): No significant change from prior study. FINDINGS  Left Ventricle: Left ventricular ejection fraction, by estimation, is 55 to 60%. The left ventricle has normal function. The left ventricle has no regional wall motion abnormalities. The left ventricular internal cavity size was normal in size. There is  no left ventricular hypertrophy. Abnormal (paradoxical) septal motion, consistent with left bundle branch block. Left ventricular diastolic parameters are consistent with Grade I diastolic dysfunction (impaired relaxation). Right Ventricle: The right ventricular size is normal. No increase in right ventricular wall thickness. Right ventricular systolic function is normal. Tricuspid regurgitation signal is inadequate for assessing PA pressure. Left Atrium: Left atrial size was normal in size. Right Atrium: Right atrial size was normal in size. Pericardium: There is no evidence of pericardial effusion. Presence of pericardial fat pad. Mitral Valve: The mitral valve is grossly normal. Trivial mitral valve regurgitation. No evidence of mitral valve stenosis. Tricuspid Valve: The tricuspid valve is grossly normal. Tricuspid valve regurgitation is trivial. No evidence of tricuspid stenosis. Aortic Valve: The aortic valve is tricuspid. Aortic valve regurgitation is not visualized. No aortic stenosis is present. Pulmonic Valve: The pulmonic valve  was grossly normal. Pulmonic valve regurgitation is not visualized. Aorta: The aortic root is normal in size and structure. Venous: The inferior vena cava is normal in size with greater than 50% respiratory variability, suggesting right atrial pressure of 3 mmHg. IAS/Shunts: The atrial septum is grossly normal.  LEFT VENTRICLE PLAX 2D LVIDd:          4.80 cm  Diastology LVIDs:         3.50 cm  LV e' lateral:   5.61 cm/s LV PW:         1.10 cm  LV E/e' lateral: 13.7 LV IVS:        1.10 cm  LV e' medial:    5.17 cm/s LVOT diam:     2.10 cm  LV E/e' medial:  14.9 LV SV:         76 LV SV Index:   34 LVOT Area:     3.46 cm  RIGHT VENTRICLE RV S prime:     13.10 cm/s TAPSE (M-mode): 2.2 cm LEFT ATRIUM           Index       RIGHT ATRIUM          Index LA diam:      3.20 cm 1.43 cm/m  RA Area:     9.41 cm LA Vol (A2C): 36.1 ml 16.11 ml/m RA Volume:   16.90 ml 7.54 ml/m LA Vol (A4C): 31.2 ml 13.92 ml/m  AORTIC VALVE LVOT Vmax:   78.30 cm/s LVOT Vmean:  53.500 cm/s LVOT VTI:    0.219 m MITRAL VALVE MV Area (PHT): 2.11 cm     SHUNTS MV Decel Time: 359 msec     Systemic VTI:  0.22 m MV E velocity: 77.10 cm/s   Systemic Diam: 2.10 cm MV A velocity: 113.00 cm/s MV E/A ratio:  0.68 Eleonore Chiquito MD Electronically signed by Eleonore Chiquito MD Signature Date/Time: 01/07/2020/1:45:10 PM    Final     Micro Results    Recent Results (from the past 240 hour(s))  SARS Coronavirus 2 by RT PCR (hospital order, performed in [facility] hospital lab) Nasopharyngeal Nasopharyngeal Swab     Status: None   Collection Time: 01/06/20 11:32 PM   Specimen: Nasopharyngeal Swab  Result Value Ref Range Status   SARS Coronavirus 2 NEGATIVE NEGATIVE Final    Comment: (NOTE) SARS-CoV-2 target nucleic acids are NOT DETECTED.  The SARS-CoV-2 RNA is generally detectable in upper and lower respiratory specimens during the acute phase of infection. The lowest concentration of SARS-CoV-2 viral copies this assay can detect is 250 copies / mL. A negative result does not preclude SARS-CoV-2 infection and should not be used as the sole basis for treatment or other patient management decisions.  A negative result may occur with improper specimen collection / handling, submission of specimen other than nasopharyngeal swab, presence of viral mutation(s) within the areas targeted by this  assay, and inadequate number of viral copies (<250 copies / mL). A negative result must be combined with clinical observations, patient history, and epidemiological information.  Fact Sheet for Patients:   StrictlyIdeas.no  Fact Sheet for Healthcare Providers: BankingDealers.co.za  This test is not yet approved or  cleared by the [place] FDA and has been authorized for detection and/or diagnosis of SARS-CoV-2 by FDA under an Emergency Use Authorization (EUA).  This EUA will remain in effect (meaning this test can be used) for the duration of the COVID-19 declaration under Section 564(b)(1) of  the Act, 21 U.S.C. section 360bbb-3(b)(1), unless the authorization is terminated or revoked sooner.  Performed at [facility], [place] [address], [place], [place] [address]     Today   Subjective    David Ford today has no headache,no chest abdominal pain,no new weakness tingling or numbness, feels much better wants to go home today.     Objective   Blood pressure (!) 170/56, pulse 62, temperature 98 F (36.7 C), temperature source Oral, resp. rate 20, height 6' 1"  (1.854 m), weight 98.7 kg, SpO2 93 %.   Intake/Output Summary (Last 24 hours) at 01/08/2020 0952 Last data filed at 01/08/2020 0407 Gross per 24 hour  Intake --  Output 150 ml  Net -150 ml    Exam  Awake Alert, No new F.N deficits, Normal affect .AT,PERRAL Supple Neck,No JVD, No cervical lymphadenopathy appriciated.  Symmetrical Chest wall movement, Good air movement bilaterally, CTAB RRR,No Gallops,Rubs or new Murmurs, No Parasternal Heave +ve B.Sounds, Abd Soft, Non tender, No organomegaly appriciated, No rebound -guarding or rigidity. No Cyanosis, Clubbing or edema, No new Rash or bruise   Data Review   CBC w Diff:  Lab Results  Component Value Date   WBC 10.9 (H) 01/08/2020   HGB 12.6 (L) 01/08/2020   HCT 38.6 (L) 01/08/2020   PLT 271  01/08/2020   LYMPHOPCT 12 07/30/2017   MONOPCT 4 07/30/2017   EOSPCT 0 07/30/2017   BASOPCT 0 07/30/2017    CMP:  Lab Results  Component Value Date   NA 138 01/08/2020   NA 141 01/23/2017   K 4.0 01/08/2020   CL 105 01/08/2020   CO2 26 01/08/2020   BUN 34 (H) 01/08/2020   BUN 42 (A) 01/23/2017   CREATININE 2.10 (H) 01/08/2020   GLU 186 01/23/2017   PROT 7.2 01/06/2020   ALBUMIN 3.5 01/06/2020   BILITOT 1.0 01/06/2020   ALKPHOS 75 01/06/2020   AST 38 01/06/2020   ALT 17 01/06/2020  . Lab Results  Component Value Date   HGBA1C 7.0 (H) 01/07/2020     Total Time in preparing paper work, data evaluation and todays exam - 14 minutes  Lala Lund M.D on 01/08/2020 at 9:52 AM  [facility]   Office  [phone]

## 2020-01-08 NOTE — Progress Notes (Signed)
Inpatient Diabetes Program Recommendations  AACE/ADA: New Consensus Statement on Inpatient Glycemic Control (2015)  Target Ranges:  Prepandial:   less than 140 mg/dL      Peak postprandial:   less than 180 mg/dL (1-2 hours)      Critically ill patients:  140 - 180 mg/dL   Lab Results  Component Value Date   GLUCAP 171 (H) 01/08/2020   HGBA1C 7.0 (H) 01/07/2020    Note consult for Diabetes and insulin teaching. I saw pt yesterday, 7/20,  while he was in the ED and discussed his home regimen and A1c levels. Discussed his home routine and follow up outpatient. Pt reports compliance with medications and insulin injections. Pt has all supplies needed at home for management.  Thanks,  Tama Headings RN, MSN, BC-ADM Inpatient Diabetes Coordinator Team Pager [phone] (8a-5p)

## 2020-01-08 NOTE — Discharge Instructions (Signed)
Follow with Primary MD [name], MD in 7 days   Get CBC, CMP  checked next visit within 1 week by Primary MD    Activity: As tolerated with Full fall precautions use walker/cane & assistance as needed  Disposition Home    Diet: Heart Healthy Low Carb  Accuchecks 4 times/day, Once in AM empty stomach and then before each meal. Log in all results and show them to your Prim.MD in 3 days. If any glucose reading is under 80 or above 300 call your Prim MD immidiately. Follow Low glucose instructions for glucose under 80 as instructed.  Special Instructions: If you have smoked or chewed Tobacco  in the last 2 yrs please stop smoking, stop any regular Alcohol  and or any Recreational drug use.  On your next visit with your primary care physician please Get Medicines reviewed and adjusted.  Please request your Prim.MD to go over all Hospital Tests and Procedure/Radiological results at the follow up, please get all Hospital records sent to your Prim MD by signing hospital release before you go home.  If you experience worsening of your admission symptoms, develop shortness of breath, life threatening emergency, suicidal or homicidal thoughts you must seek medical attention immediately by calling 911 or calling your MD immediately  if symptoms less severe.  You Must read complete instructions/literature along with all the possible adverse reactions/side effects for all the Medicines you take and that have been prescribed to you. Take any new Medicines after you have completely understood and accpet all the possible adverse reactions/side effects.

## 2020-01-08 NOTE — TOC Transition Note (Signed)
Transition of Care [facility]) - CM/SW Discharge Note   Patient Details  Name: David Ford MRN: [id number] Date of Birth: 1941-03-19  Transition of Care [facility]) CM/SW Contact:  Carles Collet, RN Phone Number: 01/08/2020, 10:50 AM   Clinical Narrative:    Spoke w patient at bedside. Declined DME. Agreeable to [facility] Discussed ratings, agreeable to [facility]. Referral accepted to [facility] for PT OT.     Final next level of care: [place] Barriers to Discharge: No Barriers Identified   Patient Goals and CMS Choice Patient states their goals for this hospitalization and ongoing recovery are:: to go home CMS Medicare.gov Compare Post Acute Care list provided to:: Patient Choice offered to / list presented to : Patient  Discharge Placement                       Discharge Plan and Services                          HH Arranged: PT, OT [facility] Agency: [place] Date [place]: 01/08/20 Time [place]: [id number] Representative spoke with at [place]: [place] ([place]) Interventions     Readmission Risk Interventions No flowsheet data found.

## 2020-01-08 NOTE — Progress Notes (Signed)
David Ford to be D/C'd Home per MD order.  Discussed with the patient and all questions fully answered.  VSS, Skin clean, dry and intact without evidence of skin break down, no evidence of skin tears noted. IV catheter discontinued intact. Site without signs and symptoms of complications. Dressing and pressure applied.  An After Visit Summary was printed and given to the patient. Patient received prescription.  D/c education completed with patient/family including follow up instructions, medication list, d/c activities limitations if indicated, with other d/c instructions as indicated by MD - patient able to verbalize understanding, all questions fully answered.   Patient instructed to return to ED, call 911, or call MD for any changes in condition.   Patient escorted via [place], and D/C home via private auto.  David Ford 01/08/2020 11:36 AM

## 2020-01-09 ENCOUNTER — Telehealth: Payer: Self-pay

## 2020-01-09 NOTE — Telephone Encounter (Signed)
Transition Care Management Follow-up Telephone Call   Date of discharge and from where: 01/08/2020 David Ford  How have you been since you were released from the hospital? Better, blood sugars doing better  Any questions or concerns? No   Items Reviewed:  Did the pt receive and understand the discharge instructions provided? Yes   Medications obtained and verified? Yes   Any new allergies since your discharge? No   Dietary orders reviewed? Yes  Do you have support at home? Yes   Functional Questionnaire: (I = Independent and D = Dependent) ADLs: I  Bathing/Dressing- I  Meal Prep- I  Eating- I  Maintaining continence- I  Transferring/Ambulation- I  Managing Meds- I  Follow up appointments reviewed:   PCP Hospital f/u appt confirmed? Yes  Scheduled to see David Ford on 01/13/2020 @ 11:20  Are transportation arrangements needed? No   If their condition worsens, is the pt aware to call PCP or go to the Emergency Dept.? Yes  Was the patient provided with contact information for the PCP's office or ED? Yes  Was to pt encouraged to call back with questions or concerns? Yes

## 2020-01-10 ENCOUNTER — Telehealth: Payer: Self-pay

## 2020-01-10 DIAGNOSIS — E11649 Type 2 diabetes mellitus with hypoglycemia without coma: Secondary | ICD-10-CM | POA: Diagnosis not present

## 2020-01-10 DIAGNOSIS — Z794 Long term (current) use of insulin: Secondary | ICD-10-CM | POA: Diagnosis not present

## 2020-01-10 NOTE — Telephone Encounter (Signed)
Physical Therapist did assessment and doesn't see need for pt or ot but does believe patient would benefit from nursing and needs for general education with help on how to manage patients diabetes . Patien'ts wife mentioned to physical therapist that wife is considering placement for the patient. Requesting a verbal order for the nursing

## 2020-01-10 NOTE — Telephone Encounter (Signed)
Notified Ok for verbal order

## 2020-01-13 ENCOUNTER — Ambulatory Visit (INDEPENDENT_AMBULATORY_CARE_PROVIDER_SITE_OTHER): Payer: Medicare HMO | Admitting: Family Medicine

## 2020-01-13 ENCOUNTER — Other Ambulatory Visit: Payer: Self-pay

## 2020-01-13 ENCOUNTER — Encounter: Payer: Self-pay | Admitting: Family Medicine

## 2020-01-13 VITALS — BP 160/88 | HR 63 | Temp 97.8°F | Ht 73.0 in | Wt 215.0 lb

## 2020-01-13 DIAGNOSIS — E1129 Type 2 diabetes mellitus with other diabetic kidney complication: Secondary | ICD-10-CM

## 2020-01-13 DIAGNOSIS — R410 Disorientation, unspecified: Secondary | ICD-10-CM | POA: Diagnosis not present

## 2020-01-13 DIAGNOSIS — K209 Esophagitis, unspecified without bleeding: Secondary | ICD-10-CM | POA: Diagnosis not present

## 2020-01-13 DIAGNOSIS — E785 Hyperlipidemia, unspecified: Secondary | ICD-10-CM | POA: Diagnosis not present

## 2020-01-13 DIAGNOSIS — IMO0002 Reserved for concepts with insufficient information to code with codable children: Secondary | ICD-10-CM

## 2020-01-13 DIAGNOSIS — E1122 Type 2 diabetes mellitus with diabetic chronic kidney disease: Secondary | ICD-10-CM | POA: Diagnosis not present

## 2020-01-13 DIAGNOSIS — Z794 Long term (current) use of insulin: Secondary | ICD-10-CM | POA: Diagnosis not present

## 2020-01-13 DIAGNOSIS — E1165 Type 2 diabetes mellitus with hyperglycemia: Secondary | ICD-10-CM

## 2020-01-13 DIAGNOSIS — N183 Chronic kidney disease, stage 3 unspecified: Secondary | ICD-10-CM

## 2020-01-13 DIAGNOSIS — E1169 Type 2 diabetes mellitus with other specified complication: Secondary | ICD-10-CM | POA: Diagnosis not present

## 2020-01-13 LAB — CBC
HCT: 42.9 % (ref 38.5–50.0)
Hemoglobin: 13.6 g/dL (ref 13.2–17.1)
MCH: 27.8 pg (ref 27.0–33.0)
MCHC: 31.7 g/dL — ABNORMAL LOW (ref 32.0–36.0)
MCV: 87.6 fL (ref 80.0–100.0)
MPV: 9.7 fL (ref 7.5–12.5)
Platelets: 362 10*3/uL (ref 140–400)
RBC: 4.9 10*6/uL (ref 4.20–5.80)
RDW: 13.2 % (ref 11.0–15.0)
WBC: 8.1 10*3/uL (ref 3.8–10.8)

## 2020-01-13 LAB — COMPREHENSIVE METABOLIC PANEL
AG Ratio: 1.2 (calc) (ref 1.0–2.5)
ALT: 13 U/L (ref 9–46)
AST: 14 U/L (ref 10–35)
Albumin: 3.7 g/dL (ref 3.6–5.1)
Alkaline phosphatase (APISO): 75 U/L (ref 35–144)
BUN/Creatinine Ratio: 10 (calc) (ref 6–22)
BUN: 23 mg/dL (ref 7–25)
CO2: 22 mmol/L (ref 20–32)
Calcium: 9.1 mg/dL (ref 8.6–10.3)
Chloride: 104 mmol/L (ref 98–110)
Creat: 2.22 mg/dL — ABNORMAL HIGH (ref 0.70–1.18)
Globulin: 3.1 g/dL (calc) (ref 1.9–3.7)
Glucose, Bld: 90 mg/dL (ref 65–99)
Potassium: 5.1 mmol/L (ref 3.5–5.3)
Sodium: 138 mmol/L (ref 135–146)
Total Bilirubin: 0.4 mg/dL (ref 0.2–1.2)
Total Protein: 6.8 g/dL (ref 6.1–8.1)

## 2020-01-13 MED ORDER — INSULIN GLARGINE 100 UNIT/ML SOLOSTAR PEN: 30.0000 [IU] | PEN_INJECTOR | Freq: Every day | SUBCUTANEOUS | 0 refills | Status: DC

## 2020-01-13 NOTE — Assessment & Plan Note (Signed)
Continue Protonix 40 mg twice daily.  Will place referral to GI as he was directed to follow-up with him during his ED visit last week.

## 2020-01-13 NOTE — Assessment & Plan Note (Signed)
Check CBC and C met. 

## 2020-01-13 NOTE — Patient Outreach (Signed)
[facility]) Care Management  01/13/2020  Reza Crymes 25-Apr-1941 [id number]   Referral Date: 01/13/20 Referral Source: MD referral Referral Reason: Diabetes   Outreach Attempt: spoke with patient and his spouse.  Discussed [facility] services and support.  Wife reports that she is looking for sitters for her husband and that she does not need nurse phone calls for follow up as patient is active with [facility] for disease management services.  Advised that CM could send a listing for in home care agencies for sitter services.  Wife is agreeable and asked that list be sent to her e-mail [email or web address].  List sent to wife's e-mail address of [place].  Advised wife to call CM if she has any questions or concerns.    Plan: RN sent in home care agencies to wife's e-mail and successful letter mailed to home. RN CM will close case at this time.    Jone Baseman, RN, MSN [facility] Care Management Care Management Coordinator Direct Line [phone] Toll Free: [phone]  Fax: [phone]

## 2020-01-13 NOTE — Patient Instructions (Signed)
It was very nice to see you today!  I will place a referral to [facility], neurology and GI.  No medication changes today.  We will check blood work today.   Take care, Dr Jerline Pain  Please try these tips to maintain a healthy lifestyle:   Eat at least 3 REAL meals and 1-2 snacks per day.  Aim for no more than 5 hours between eating.  If you eat breakfast, please do so within one hour of getting up.    Each meal should contain half fruits/vegetables, one quarter protein, and one quarter carbs (no bigger than a computer mouse)   Cut down on sweet beverages. This includes juice, soda, and sweet tea.     Drink at least 1 glass of water with each meal and aim for at least 8 glasses per day   Exercise at least 150 minutes every week.

## 2020-01-13 NOTE — Progress Notes (Signed)
Chief Complaint:  David Ford is a 79 y.o. male who presents today for a TCM visit.  Assessment/Plan:  New/Acute Problems: Right Knee Pain No red flags.  He will try topical Voltaren.  If no improvement will need x-ray.  Confusion Patient's wife is worried about a neurodegenerative condition.  Normal mental status today. Recently had CT brain and EEG which were normal.  Labs thus far with no obvious causes either.  Will place referral to neurology.  Chronic Problems Addressed Today: DM type 2, uncontrolled, with renal complications (HCC) T0P 7.0 in the hospital.  Continue Lantus 30 units daily.  Recheck A1c in about 3 months or so.  Esophagitis Continue Protonix 40 mg twice daily.  Will place referral to GI as he was directed to follow-up with him during his ED visit last week.  CKD (chronic kidney disease), stage III Check CBC and C met.     Subjective:  HPI:  Summary of Hospital admission: Reason for admission: Unresponsiveness Date of admission: 01/06/2020 Date of discharge: 01/08/2020 Date of Interactive contact: 01/09/20 Summary of Hospital course: Patient presented to the ED on 01/05/2020 with coffee-ground emesis.  Had CT abdomen and pelvis performed.  Was discharged home in stable condition with Protonix and Zofran.  The next morning he was found by his wife to be unresponsive on 05/08/2020.  She performed CPR and EMS was called.  He was found to have CBG of 48.  In the ED was found to have serum creatinine of 2.0 and WBC 18.7.  He was admitted for AKI, leukocytosis, and hypoglycemia.  He was given IV fluids and had improvement in renal function.  Had work-up for unresponsiveness including echocardiogram and EEG which was normal.  His glimepiride was discontinued.  Symptoms gradually improved and he was discharged home with Lantus and sliding scale insulin.  Interim history outlined by problem:  Patient has done mostly okay since being home for the past week or so.  His  wife is with him here today.  She is very concerned about his safety at home.  She has noticed more confusion.  This occurs randomly.  Will sometimes be in normal state of mind had an otherwise say things that do not make sense and symptoms apparently have hallucinations.  She would like referral to neurology to discuss further.  Patient overall feels like he is doing well.  He has apparently been evaluated by [facility] for home health but I do not see any referrals in our system.  He has been compliant with his medications.  Sugars usually in the 90s to 100s.  It was 60 this morning he had one reading of about 300 after being discharged.  He has also had some right-sided knee pain for the past week or so.  No obvious injuries.  Mild swelling.   ROS: Per HPI, otherwise a complete review of systems was negative.   PMH:  The following were reviewed and entered/updated in epic: Past Medical History:  Diagnosis Date  . AKI (acute kidney injury) ([place]) 06/18/2015  . Cancer (HCC)    hx of prostate cancer  . Chronic kidney disease   . Diabetes mellitus without complication ([place])   . DKA (diabetic ketoacidoses) ([place]) 06/18/2015  . Glaucoma   . Hyperlipidemia   . Hypertension    Patient Active Problem List   Diagnosis Date Noted  . Esophagitis 01/06/2020  . Dermatitis 08/20/2018  . Glaucoma 01/31/2018  . CKD (chronic kidney disease), stage III 08/03/2017  .  Hyperlipidemia associated with type 2 diabetes mellitus ([place]) 06/02/2017  . BPH (benign prostatic hyperplasia) 06/02/2017  . History of prostate cancer 06/02/2017  . Insomnia 06/02/2017  . Hypertension associated with diabetes ([place]) 06/17/2015  . DM type 2, uncontrolled, with renal complications ([place]) 46/96/2952   Past Surgical History:  Procedure Laterality Date  . AMPUTATION Left 07/03/2015   Procedure: Left Foot 1st and 2nd Ray Amputation;  Surgeon: Newt Minion, MD;  Location: [place];  Service: Orthopedics;  Laterality: Left;  .  BASCILIC VEIN TRANSPOSITION Left 07/14/2015   Procedure: BRACHIOBASILIC VEIN TRANSPOSITION  ;  Surgeon: Elam Dutch, MD;  Location: [place];  Service: Vascular;  Laterality: Left;  . IRIDOTOMY / IRIDECTOMY    . prostate seeds      Family History  Problem Relation Age of Onset  . Diabetes Mother   . Hypertension Mother   . Stroke Mother   . Diabetes Sister   . Glaucoma Sister   . Diabetes Brother   . Glaucoma Brother   . Amblyopia Neg Hx   . Blindness Neg Hx   . Cataracts Neg Hx   . Macular degeneration Neg Hx   . Retinal detachment Neg Hx   . Strabismus Neg Hx   . Retinitis pigmentosa Neg Hx     Medications- Reconciled discharge and current medications in Epic.  Current Outpatient Medications  Medication Sig Dispense Refill  . atorvastatin (LIPITOR) 10 MG tablet TAKE 1 TABLET EVERY DAY (Patient taking differently: Take 10 mg by mouth daily. ) 90 tablet 1  . BD PEN NEEDLE NANO U/F 32G X 4 MM MISC USE WITH PEN INJECTOR DAILY (Patient taking differently: 1 each by Other route See admin instructions. Use with pen injector daily) 100 each 3  . Blood Glucose Monitoring Suppl (TRUE METRIX AIR GLUCOSE METER) w/Device KIT 1 Device by Other route daily.     . brimonidine (ALPHAGAN) 0.2 % ophthalmic solution Place 1 drop into both eyes in the morning and at bedtime. Original SIG was 1 gtt ou tid  But pt states only taking 1 gtt ou bid    . carvedilol (COREG) 3.125 MG tablet TAKE 1 TABLET TWICE DAILY WITH MEALS (Patient taking differently: Take 3.125 mg by mouth 2 (two) times daily with a meal. ) 180 tablet 1  . dorzolamide-timolol (COSOPT) 22.3-6.8 MG/ML ophthalmic solution Place 1 drop into both eyes 2 (two) times daily.     . fluticasone (FLONASE) 50 MCG/ACT nasal spray Place 2 sprays into both nostrils daily. 16 g 6  . glucose blood (TRUE METRIX BLOOD GLUCOSE TEST) test strip CHECK BLOOD SUGAR FOUR TIMES DAILY  AND AS NEEDED (Patient taking differently: 1 each by Other route See admin  instructions. CHECK BLOOD SUGAR FOUR TIMES DAILY  AND AS NEEDED) 450 strip 3  . insulin aspart (NOVOLOG) 100 UNIT/ML injection Substitute to any brand approved.Before each meal 3 times a day, 140-199 - 2 units, 200-250 - 4 units, 251-299 - 6 units,  300-349 - 8 units,  350 or above 10 units. Dispense syringes and needles as needed, Ok to switch to PEN if approved. DX DM2, Code E11.65 10 mL 0  . insulin glargine (LANTUS) 100 UNIT/ML Solostar Pen Inject 30 Units into the skin daily. 15 mL 0  . Insulin Pen Needle (PEN NEEDLES) 32G X 5 MM MISC 1 application by Does not apply route 4 (four) times daily as needed (to inject insulin). 100 each 11  . latanoprost (XALATAN) 0.005 %  ophthalmic solution INSTILL 1 DROP INTO BOTH EYES AT BEDTIME (Patient taking differently: Place 1 drop into both eyes at bedtime. ) 3 mL 3  . ondansetron (ZOFRAN ODT) 4 MG disintegrating tablet Take 1 tablet (4 mg total) by mouth every 8 (eight) hours as needed for nausea or vomiting. 10 tablet 0  . pantoprazole (PROTONIX) 40 MG tablet Take 1 tablet (40 mg total) by mouth 2 (two) times daily. 60 tablet 0  . tamsulosin (FLOMAX) 0.4 MG CAPS capsule TAKE 1 CAPSULE EVERY DAY (Patient taking differently: Take 0.4 mg by mouth daily. ) 90 capsule 1  . traZODone (DESYREL) 150 MG tablet TAKE 2 TABLETS AT BEDTIME (Patient taking differently: Take 300 mg by mouth at bedtime. ) 180 tablet 0  . triamcinolone ointment (KENALOG) 0.5 % APPLY TOPICALLY 2 TIMES DAILY (Patient taking differently: Apply 1 application topically 2 (two) times daily. ) 30 g 2  . TRUEplus Lancets 33G MISC CHECK BLOOD SUGAR FOUR TIMES DAILY  AND AS NEEDED (Patient taking differently: 1 each by Other route See admin instructions. CHECK BLOOD SUGAR FOUR TIMES DAILY  AND AS NEEDED) 500 each 3  . Vitamin D, Cholecalciferol, 1000 units CAPS Take 1,000 Units by mouth daily.    Marland Kitchen amLODipine (NORVASC) 10 MG tablet TAKE 1 TABLET EVERY DAY (Patient not taking: Reported on 01/13/2020) 90  tablet 0   No current facility-administered medications for this visit.    Allergies-reviewed and updated No Known Allergies  Social History   Socioeconomic History  . Marital status: Divorced    Spouse name: Not on file  . Number of children: Not on file  . Years of education: Not on file  . Highest education level: Not on file  Occupational History  . Not on file  Tobacco Use  . Smoking status: Never Smoker  . Smokeless tobacco: Never Used  Vaping Use  . Vaping Use: Never used  Substance and Sexual Activity  . Alcohol use: Yes    Comment: once a week  . Drug use: No  . Sexual activity: Not on file  Other Topics Concern  . Not on file  Social History Narrative  . Not on file   Social Determinants of Health   Financial Resource Strain:   . Difficulty of Paying Living Expenses:   Food Insecurity:   . Worried About Charity fundraiser in the Last Year:   . Arboriculturist in the Last Year:   Transportation Needs:   . Film/video editor (Medical):   Marland Kitchen Lack of Transportation (Non-Medical):   Physical Activity:   . Days of Exercise per Week:   . Minutes of Exercise per Session:   Stress:   . Feeling of Stress :   Social Connections:   . Frequency of Communication with Friends and Family:   . Frequency of Social Gatherings with Friends and Family:   . Attends Religious Services:   . Active Member of Clubs or Organizations:   . Attends Archivist Meetings:   Marland Kitchen Marital Status:         Objective:  Physical Exam: BP (!) 160/88   Pulse 63   Temp 97.8 F (36.6 C) (Temporal)   Ht _0  (1.854 m)   Wt (!) 215 lb (97.5 kg)   SpO2 96%   BMI 28.37 kg/m   Gen: NAD, resting comfortably CV: RRR with no murmurs appreciated Pulm: NWOB, CTAB with no crackles, wheezes, or rhonchi GI: Normal bowel sounds present.  Soft, Nontender, Nondistended. MSK: No edema, cyanosis, or clubbing noted -Right knee: Tender palpation along joint line.  Neurovascular intact  distally.  Range of motion throughout. Skin: Warm, dry Neuro: Grossly normal, moves all extremities Psych: Normal affect and thought content      Brittani Purdum M. Jerline Pain, MD 01/13/2020 12:10 PM

## 2020-01-13 NOTE — Assessment & Plan Note (Signed)
A1c 7.0 in the hospital.  Continue Lantus 30 units daily.  Recheck A1c in about 3 months or so.

## 2020-01-14 ENCOUNTER — Encounter: Payer: Self-pay | Admitting: Neurology

## 2020-01-14 ENCOUNTER — Telehealth: Payer: Self-pay | Admitting: Family Medicine

## 2020-01-14 NOTE — Progress Notes (Signed)
Dr Marigene Ehlers interpretation of your lab work:  Your blood work is all STABLE. We do not need to make any changes to your treatment plan at this time.   If you have any additional questions, please give [place] a call or send [place] a message through [place].  Take care, Dr Jerline Pain

## 2020-01-14 NOTE — Telephone Encounter (Signed)
[place] is calling in to advise Dr.Parker that they evaluated him and feels as if he does not need any assistance with skilled services,

## 2020-01-14 NOTE — Telephone Encounter (Signed)
Fyi.

## 2020-02-05 ENCOUNTER — Other Ambulatory Visit: Payer: Self-pay | Admitting: Family Medicine

## 2020-02-07 ENCOUNTER — Other Ambulatory Visit: Payer: Self-pay | Admitting: Family Medicine

## 2020-02-07 NOTE — Telephone Encounter (Signed)
Rx Glimepiride not in medicine history Please advise

## 2020-02-26 ENCOUNTER — Other Ambulatory Visit: Payer: Self-pay | Admitting: *Deleted

## 2020-02-26 NOTE — Telephone Encounter (Signed)
[facility] pharmacy requesting  Refill Rx Pantoprazole 40mg  Last refill by Dr Candiss Norse Last refill on 01/08/2020

## 2020-02-27 MED ORDER — PANTOPRAZOLE SODIUM 40 MG PO TBEC: 40.0000 mg | DELAYED_RELEASE_TABLET | Freq: Two times a day (BID) | ORAL | 0 refills | Status: DC

## 2020-03-18 ENCOUNTER — Other Ambulatory Visit: Payer: Self-pay | Admitting: Family Medicine

## 2020-04-15 DIAGNOSIS — E119 Type 2 diabetes mellitus without complications: Secondary | ICD-10-CM | POA: Diagnosis not present

## 2020-04-15 DIAGNOSIS — Z794 Long term (current) use of insulin: Secondary | ICD-10-CM | POA: Diagnosis not present

## 2020-04-20 ENCOUNTER — Ambulatory Visit: Payer: Medicare HMO | Admitting: Neurology

## 2020-04-20 ENCOUNTER — Encounter: Payer: Self-pay | Admitting: Neurology

## 2020-04-20 DIAGNOSIS — Z029 Encounter for administrative examinations, unspecified: Secondary | ICD-10-CM

## 2020-05-09 ENCOUNTER — Other Ambulatory Visit: Payer: Self-pay | Admitting: Family Medicine

## 2020-05-13 ENCOUNTER — Other Ambulatory Visit: Payer: Self-pay | Admitting: Family Medicine

## 2020-05-16 ENCOUNTER — Other Ambulatory Visit: Payer: Self-pay | Admitting: Family Medicine

## 2020-05-18 NOTE — Telephone Encounter (Signed)
Not in medication history  discontinued on 01/06/2020

## 2020-05-21 ENCOUNTER — Other Ambulatory Visit: Payer: Self-pay | Admitting: *Deleted

## 2020-05-21 MED ORDER — INSULIN GLARGINE 100 UNIT/ML SOLOSTAR PEN
30.0000 [IU] | PEN_INJECTOR | Freq: Every day | SUBCUTANEOUS | 0 refills | Status: DC
Start: 2020-05-21 — End: 2020-07-28

## 2020-05-21 MED ORDER — TRUE METRIX BLOOD GLUCOSE TEST VI STRP
1.0000 | ORAL_STRIP | 1 refills | Status: DC
Start: 2020-05-21 — End: 2020-07-28

## 2020-07-22 ENCOUNTER — Ambulatory Visit: Payer: Medicare HMO | Admitting: Neurology

## 2020-07-27 ENCOUNTER — Other Ambulatory Visit: Payer: Self-pay | Admitting: Family Medicine

## 2020-07-28 ENCOUNTER — Other Ambulatory Visit: Payer: Self-pay | Admitting: Family Medicine

## 2020-07-29 ENCOUNTER — Other Ambulatory Visit: Payer: Self-pay | Admitting: Family Medicine

## 2020-07-29 ENCOUNTER — Other Ambulatory Visit: Payer: Self-pay | Admitting: *Deleted

## 2020-07-29 MED ORDER — PANTOPRAZOLE SODIUM 40 MG PO TBEC
40.0000 mg | DELAYED_RELEASE_TABLET | Freq: Two times a day (BID) | ORAL | 0 refills | Status: DC
Start: 2020-07-29 — End: 2020-10-01

## 2020-08-05 ENCOUNTER — Other Ambulatory Visit: Payer: Self-pay | Admitting: Family Medicine

## 2020-08-19 ENCOUNTER — Telehealth: Payer: Self-pay | Admitting: Family Medicine

## 2020-08-19 NOTE — Telephone Encounter (Signed)
CALL COULD NOT BE COMPLETED. Pt due to schedule Medicare Annual Wellness Visit (AWV) either virtually OR in office.   Last AWV 08/12/19; please schedule at anytime with [facility]-Nurse Health Advisor at [facility].  This should be a 45 minute visit.

## 2020-09-29 ENCOUNTER — Ambulatory Visit: Payer: Medicare HMO | Admitting: Neurology

## 2020-10-01 ENCOUNTER — Ambulatory Visit (INDEPENDENT_AMBULATORY_CARE_PROVIDER_SITE_OTHER): Payer: Medicare HMO | Admitting: Family Medicine

## 2020-10-01 ENCOUNTER — Other Ambulatory Visit: Payer: Self-pay

## 2020-10-01 ENCOUNTER — Encounter: Payer: Self-pay | Admitting: Family Medicine

## 2020-10-01 VITALS — BP 137/72 | HR 53 | Temp 97.6°F | Ht 73.0 in | Wt 198.8 lb

## 2020-10-01 DIAGNOSIS — I152 Hypertension secondary to endocrine disorders: Secondary | ICD-10-CM

## 2020-10-01 DIAGNOSIS — E1129 Type 2 diabetes mellitus with other diabetic kidney complication: Secondary | ICD-10-CM

## 2020-10-01 DIAGNOSIS — E1165 Type 2 diabetes mellitus with hyperglycemia: Secondary | ICD-10-CM

## 2020-10-01 DIAGNOSIS — E1159 Type 2 diabetes mellitus with other circulatory complications: Secondary | ICD-10-CM | POA: Diagnosis not present

## 2020-10-01 DIAGNOSIS — IMO0002 Reserved for concepts with insufficient information to code with codable children: Secondary | ICD-10-CM

## 2020-10-01 LAB — POCT GLYCOSYLATED HEMOGLOBIN (HGB A1C): Hemoglobin A1C: 8.8 % — AB (ref 4.0–5.6)

## 2020-10-01 MED ORDER — TAMSULOSIN HCL 0.4 MG PO CAPS: 0.4000 mg | ORAL_CAPSULE | Freq: Every day | ORAL | 3 refills | Status: AC

## 2020-10-01 MED ORDER — AMLODIPINE BESYLATE 10 MG PO TABS: 1.0000 | ORAL_TABLET | Freq: Every day | ORAL | 3 refills | Status: DC

## 2020-10-01 MED ORDER — ATORVASTATIN CALCIUM 10 MG PO TABS: 1.0000 | ORAL_TABLET | Freq: Every day | ORAL | 1 refills | Status: AC

## 2020-10-01 MED ORDER — EMPAGLIFLOZIN 10 MG PO TABS: 10.0000 mg | ORAL_TABLET | Freq: Every day | ORAL | 3 refills | Status: DC

## 2020-10-01 MED ORDER — CARVEDILOL 3.125 MG PO TABS: 3.1250 mg | ORAL_TABLET | Freq: Two times a day (BID) | ORAL | 3 refills | Status: AC

## 2020-10-01 MED ORDER — PANTOPRAZOLE SODIUM 40 MG PO TBEC: 40.0000 mg | DELAYED_RELEASE_TABLET | Freq: Two times a day (BID) | ORAL | 3 refills | Status: AC

## 2020-10-01 NOTE — Assessment & Plan Note (Signed)
At goal. Continue norvasc 10mg  daily and coreg 3.125mg  twice daily. Follow up in 3 months.

## 2020-10-01 NOTE — Progress Notes (Signed)
   David Ford is a 80 y.o. male who presents today for an office visit.  Assessment/Plan:  Chronic Problems Addressed Today: DM type 2, uncontrolled, with renal complications (HCC) N8T 8.8.  Fasting sugars range from 72 to low 100s.  He is on Lantus 34 units daily.  Given his fasting readings we will continue Lantus 34 units daily for now.  We have tried GLP-1 agonist in the past but he was not able to afford insurance would not cover.  We will start Jardiance 10 mg daily.  Discussed potential side effects.  Follow-up in 3 months to recheck A1c.  Hypertension associated with diabetes ([place]) At goal. Continue norvasc 10mg  daily and coreg 3.125mg  twice daily. Follow up in 3 months.     Subjective:  HPI:  See a/p.         Objective:  Physical Exam: BP 137/72   Pulse (!) 53   Temp 97.6 F (36.4 C) (Temporal)   Ht 6\' 1"  (1.854 m)   Wt 198 lb 12.8 oz (90.2 kg)   SpO2 99%   BMI 26.23 kg/m   Gen: No acute distress, resting comfortably CV: Regular rate and rhythm with no murmurs appreciated Pulm: Normal work of breathing, clear to auscultation bilaterally with no crackles, wheezes, or rhonchi Neuro: Grossly normal, moves all extremities Psych: Normal affect and thought content      Eleftheria Taborn M. Jerline Pain, MD 10/01/2020 12:05 PM

## 2020-10-01 NOTE — Assessment & Plan Note (Signed)
A1c 8.8.  Fasting sugars range from 72 to low 100s.  He is on Lantus 34 units daily.  Given his fasting readings we will continue Lantus 34 units daily for now.  We have tried GLP-1 agonist in the past but he was not able to afford insurance would not cover.  We will start Jardiance 10 mg daily.  Discussed potential side effects.  Follow-up in 3 months to recheck A1c.

## 2020-10-01 NOTE — Patient Instructions (Signed)
It was very nice to see you today!  We will add on Jardiance to your medications.  We will leave everything else the same.    I will see you back in 3 months for your annual checkup.  We can check labs at that point.  Please come back to see me sooner or let me know if he has any issues with the medication changes.  Take care, Dr Jerline Pain  PLEASE NOTE:  If you had any lab tests please let us know if you have not heard back within a few days. You may see your results on mychart before we have a chance to review them but we will give you a call once they are reviewed by [place]. If we ordered any referrals today, please let us know if you have not heard from their office within the next week.   Please try these tips to maintain a healthy lifestyle:   Eat at least 3 REAL meals and 1-2 snacks per day.  Aim for no more than 5 hours between eating.  If you eat breakfast, please do so within one hour of getting up.    Each meal should contain half fruits/vegetables, one quarter protein, and one quarter carbs (no bigger than a computer mouse)   Cut down on sweet beverages. This includes juice, soda, and sweet tea.     Drink at least 1 glass of water with each meal and aim for at least 8 glasses per day   Exercise at least 150 minutes every week.

## 2020-10-02 ENCOUNTER — Telehealth: Payer: Self-pay | Admitting: Family Medicine

## 2020-10-13 DIAGNOSIS — H401123 Primary open-angle glaucoma, left eye, severe stage: Secondary | ICD-10-CM | POA: Diagnosis not present

## 2020-10-13 DIAGNOSIS — E113293 Type 2 diabetes mellitus with mild nonproliferative diabetic retinopathy without macular edema, bilateral: Secondary | ICD-10-CM | POA: Diagnosis not present

## 2020-10-13 DIAGNOSIS — H26492 Other secondary cataract, left eye: Secondary | ICD-10-CM | POA: Diagnosis not present

## 2020-10-13 DIAGNOSIS — H401111 Primary open-angle glaucoma, right eye, mild stage: Secondary | ICD-10-CM | POA: Diagnosis not present

## 2020-10-13 LAB — HM DIABETES EYE EXAM

## 2020-10-14 ENCOUNTER — Other Ambulatory Visit: Payer: Self-pay | Admitting: *Deleted

## 2020-10-14 ENCOUNTER — Encounter: Payer: Self-pay | Admitting: Family Medicine

## 2020-10-14 DIAGNOSIS — Z89412 Acquired absence of left great toe: Secondary | ICD-10-CM | POA: Diagnosis not present

## 2020-10-14 DIAGNOSIS — E1122 Type 2 diabetes mellitus with diabetic chronic kidney disease: Secondary | ICD-10-CM | POA: Diagnosis not present

## 2020-10-14 DIAGNOSIS — Z7984 Long term (current) use of oral hypoglycemic drugs: Secondary | ICD-10-CM | POA: Diagnosis not present

## 2020-10-14 DIAGNOSIS — Z89422 Acquired absence of other left toe(s): Secondary | ICD-10-CM | POA: Diagnosis not present

## 2020-10-14 DIAGNOSIS — N183 Chronic kidney disease, stage 3 unspecified: Secondary | ICD-10-CM | POA: Diagnosis not present

## 2020-10-14 DIAGNOSIS — I120 Hypertensive chronic kidney disease with stage 5 chronic kidney disease or end stage renal disease: Secondary | ICD-10-CM | POA: Diagnosis not present

## 2020-10-19 DIAGNOSIS — Z20822 Contact with and (suspected) exposure to covid-19: Secondary | ICD-10-CM | POA: Diagnosis not present

## 2020-11-21 ENCOUNTER — Emergency Department (HOSPITAL_COMMUNITY)
Admission: EM | Admit: 2020-11-21 | Discharge: 2020-11-21 | Disposition: A | Discharge status: Home / Self Care | Payer: Medicare HMO | Attending: Emergency Medicine | Admitting: Emergency Medicine

## 2020-11-21 ENCOUNTER — Encounter (HOSPITAL_COMMUNITY): Payer: Self-pay | Admitting: Emergency Medicine

## 2020-11-21 DIAGNOSIS — Z794 Long term (current) use of insulin: Secondary | ICD-10-CM | POA: Insufficient documentation

## 2020-11-21 DIAGNOSIS — E11649 Type 2 diabetes mellitus with hypoglycemia without coma: Secondary | ICD-10-CM | POA: Insufficient documentation

## 2020-11-21 DIAGNOSIS — I129 Hypertensive chronic kidney disease with stage 1 through stage 4 chronic kidney disease, or unspecified chronic kidney disease: Secondary | ICD-10-CM | POA: Insufficient documentation

## 2020-11-21 DIAGNOSIS — Z8546 Personal history of malignant neoplasm of prostate: Secondary | ICD-10-CM | POA: Diagnosis not present

## 2020-11-21 DIAGNOSIS — E785 Hyperlipidemia, unspecified: Secondary | ICD-10-CM | POA: Insufficient documentation

## 2020-11-21 DIAGNOSIS — E162 Hypoglycemia, unspecified: Secondary | ICD-10-CM

## 2020-11-21 DIAGNOSIS — Z7984 Long term (current) use of oral hypoglycemic drugs: Secondary | ICD-10-CM | POA: Insufficient documentation

## 2020-11-21 DIAGNOSIS — E1122 Type 2 diabetes mellitus with diabetic chronic kidney disease: Secondary | ICD-10-CM | POA: Insufficient documentation

## 2020-11-21 DIAGNOSIS — I447 Left bundle-branch block, unspecified: Secondary | ICD-10-CM | POA: Diagnosis not present

## 2020-11-21 DIAGNOSIS — Z79899 Other long term (current) drug therapy: Secondary | ICD-10-CM | POA: Insufficient documentation

## 2020-11-21 DIAGNOSIS — E1169 Type 2 diabetes mellitus with other specified complication: Secondary | ICD-10-CM | POA: Insufficient documentation

## 2020-11-21 DIAGNOSIS — E1139 Type 2 diabetes mellitus with other diabetic ophthalmic complication: Secondary | ICD-10-CM | POA: Insufficient documentation

## 2020-11-21 DIAGNOSIS — N182 Chronic kidney disease, stage 2 (mild): Secondary | ICD-10-CM | POA: Diagnosis not present

## 2020-11-21 DIAGNOSIS — E161 Other hypoglycemia: Secondary | ICD-10-CM | POA: Diagnosis not present

## 2020-11-21 DIAGNOSIS — R739 Hyperglycemia, unspecified: Secondary | ICD-10-CM | POA: Diagnosis present

## 2020-11-21 DIAGNOSIS — R410 Disorientation, unspecified: Secondary | ICD-10-CM | POA: Diagnosis not present

## 2020-11-21 DIAGNOSIS — R4020 Unspecified coma: Secondary | ICD-10-CM | POA: Diagnosis not present

## 2020-11-21 LAB — URINALYSIS, ROUTINE W REFLEX MICROSCOPIC
Bacteria, UA: NONE SEEN
Bilirubin Urine: NEGATIVE
Glucose, UA: NEGATIVE mg/dL
Ketones, ur: NEGATIVE mg/dL
Leukocytes,Ua: NEGATIVE
Nitrite: NEGATIVE
Protein, ur: 100 mg/dL — AB
Specific Gravity, Urine: 1.014 (ref 1.005–1.030)
pH: 5 (ref 5.0–8.0)

## 2020-11-21 LAB — CBG MONITORING, ED
Glucose-Capillary: 101 mg/dL — ABNORMAL HIGH (ref 70–99)
Glucose-Capillary: 145 mg/dL — ABNORMAL HIGH (ref 70–99)

## 2020-11-21 LAB — CBC
HCT: 45.8 % (ref 39.0–52.0)
Hemoglobin: 14.5 g/dL (ref 13.0–17.0)
MCH: 28 pg (ref 26.0–34.0)
MCHC: 31.7 g/dL (ref 30.0–36.0)
MCV: 88.6 fL (ref 80.0–100.0)
Platelets: 298 10*3/uL (ref 150–400)
RBC: 5.17 MIL/uL (ref 4.22–5.81)
RDW: 13.5 % (ref 11.5–15.5)
WBC: 6.4 10*3/uL (ref 4.0–10.5)
nRBC: 0 % (ref 0.0–0.2)

## 2020-11-21 LAB — BASIC METABOLIC PANEL
Anion gap: 10 (ref 5–15)
BUN: 18 mg/dL (ref 8–23)
CO2: 21 mmol/L — ABNORMAL LOW (ref 22–32)
Calcium: 9.4 mg/dL (ref 8.9–10.3)
Chloride: 106 mmol/L (ref 98–111)
Creatinine, Ser: 1.99 mg/dL — ABNORMAL HIGH (ref 0.61–1.24)
GFR, Estimated: 34 mL/min — ABNORMAL LOW (ref >60)
Glucose, Bld: 103 mg/dL — ABNORMAL HIGH (ref 70–99)
Potassium: 3.5 mmol/L (ref 3.5–5.1)
Sodium: 137 mmol/L (ref 135–145)

## 2020-11-21 NOTE — ED Notes (Signed)
The  Pt is alert and oriented x 4  The family member with the pt  Reports that he was confused earlier  And she think he still is  However the pt is alert and orineted at present

## 2020-11-21 NOTE — ED Provider Notes (Signed)
[facility] EMERGENCY DEPARTMENT Provider Note   CSN: [id number] Arrival date & time: 11/21/20  1727     History No chief complaint on file.   Frankie Scipio is a 80 y.o. male.  80 year old male with prior medical history as detailed below presents for evaluation.  Patient was found to be hypoglycemic by EMS.  Patient is improved now.  He reports that he had minimal to no p.o. intake earlier today.  He denies associated fever, pain, nausea, vomiting, or other current complaint.  The history is provided by the patient and medical records.  Hypoglycemia Initial blood sugar:  40 Severity:  Moderate Onset quality:  Unable to specify Timing:  Unable to specify Progression:  Resolved Chronicity:  New Diabetic status:  Controlled with insulin      Past Medical History:  Diagnosis Date  . AKI (acute kidney injury) ([place]) 06/18/2015  . Cancer (HCC)    hx of prostate cancer  . Chronic kidney disease   . Diabetes mellitus without complication ([place])   . DKA (diabetic ketoacidoses) 06/18/2015  . Glaucoma   . Hyperlipidemia   . Hypertension     Patient Active Problem List   Diagnosis Date Noted  . Esophagitis 01/06/2020  . Dermatitis 08/20/2018  . Glaucoma 01/31/2018  . CKD (chronic kidney disease), stage III 08/03/2017  . Hyperlipidemia associated with type 2 diabetes mellitus ([place]) 06/02/2017  . BPH (benign prostatic hyperplasia) 06/02/2017  . History of prostate cancer 06/02/2017  . Insomnia 06/02/2017  . Hypertension associated with diabetes ([place]) 06/17/2015  . DM type 2, uncontrolled, with renal complications ([place]) 54/36/0677    Past Surgical History:  Procedure Laterality Date  . AMPUTATION Left 07/03/2015   Procedure: Left Foot 1st and 2nd Ray Amputation;  Surgeon: Newt Minion, MD;  Location: [facility];  Service: Orthopedics;  Laterality: Left;  . BASCILIC VEIN TRANSPOSITION Left 07/14/2015   Procedure: BRACHIOBASILIC VEIN TRANSPOSITION  ;  Surgeon:  Elam Dutch, MD;  Location: [facility];  Service: Vascular;  Laterality: Left;  . IRIDOTOMY / IRIDECTOMY    . prostate seeds         Family History  Problem Relation Age of Onset  . Diabetes Mother   . Hypertension Mother   . Stroke Mother   . Diabetes Sister   . Glaucoma Sister   . Diabetes Brother   . Glaucoma Brother   . Amblyopia Neg Hx   . Blindness Neg Hx   . Cataracts Neg Hx   . Macular degeneration Neg Hx   . Retinal detachment Neg Hx   . Strabismus Neg Hx   . Retinitis pigmentosa Neg Hx     Social History   Tobacco Use  . Smoking status: Never Smoker  . Smokeless tobacco: Never Used  Vaping Use  . Vaping Use: Never used  Substance Use Topics  . Alcohol use: Yes    Comment: once a week  . Drug use: No    Home Medications Prior to Admission medications   Medication Sig Start Date End Date Taking? Authorizing Provider  amLODipine (NORVASC) 10 MG tablet Take 1 tablet (10 mg total) by mouth daily. 10/01/20   [name], MD  atorvastatin (LIPITOR) 10 MG tablet Take 1 tablet (10 mg total) by mouth daily. 10/01/20   [name], MD  Azelastine HCl 137 MCG/SPRAY SOLN USE 2 SPRAYS INTO BOTH NOSTRILS 2 (TWO) TIMES DAILY. 05/18/20   [name], MD  BD PEN NEEDLE NANO  U/F 32G X 4 MM MISC USE WITH PEN INJECTOR DAILY Patient taking differently: 1 each by Other route See admin instructions. Use with pen injector daily 01/03/20   [name], MD  Blood Glucose Monitoring Suppl (TRUE METRIX METER) w/Device KIT USE AS DIRECTED 02/10/20   [name], MD  brimonidine [facility]) 0.2 % ophthalmic solution Place 1 drop into both eyes in the morning and at bedtime. Original SIG was 1 gtt ou tid  But pt states only taking 1 gtt ou bid 06/01/18   [provider]  carvedilol (COREG) 3.125 MG tablet Take 1 tablet (3.125 mg total) by mouth 2 (two) times daily with a meal. 10/01/20   [name], MD  dorzolamide-timolol (COSOPT) 22.3-6.8 MG/ML ophthalmic  solution Place 1 drop into both eyes 2 (two) times daily.  04/02/19   [provider]  empagliflozin (JARDIANCE) 10 MG TABS tablet Take 1 tablet (10 mg total) by mouth daily before breakfast. 10/01/20   [name], MD  FLUAD QUADRIVALENT 0.5 ML injection  07/09/20   [provider]  fluticasone (FLONASE) 50 MCG/ACT nasal spray Place 2 sprays into both nostrils daily. 07/24/18   [name], MD  Insulin Pen Needle (PEN NEEDLES) 32G X 5 MM MISC 1 application by Does not apply route 4 (four) times daily as needed (to inject insulin). 08/03/17   [name], MD  LANTUS SOLOSTAR 100 UNIT/ML Solostar Pen INJECT 35 TO 50 UNITS INTO THE SKIN DAILY. 07/28/20   [name], MD  latanoprost (XALATAN) 0.005 % ophthalmic solution INSTILL 1 DROP INTO BOTH EYES AT BEDTIME Patient taking differently: Place 1 drop into both eyes at bedtime. 03/29/18   [name], MD  pantoprazole (PROTONIX) 40 MG tablet Take 1 tablet (40 mg total) by mouth 2 (two) times daily. 10/01/20   [name], MD  PFIZER-BIONTECH COVID-19 [facility] 30 MCG/0.3ML injection  07/09/20   [provider]  tamsulosin (FLOMAX) 0.4 MG CAPS capsule Take 1 capsule (0.4 mg total) by mouth daily. 10/01/20   [name], MD  traZODone (DESYREL) 150 MG tablet TAKE 2 TABLETS AT BEDTIME 07/29/20   [name], MD  triamcinolone ointment (KENALOG) 0.5 % APPLY TOPICALLY 2 TIMES DAILY Patient taking differently: Apply 1 application topically 2 (two) times daily. 12/24/19   [name], MD  TRUE METRIX BLOOD GLUCOSE TEST test strip TEST BLOOD SUGAR FOUR TIMES DAILY AND AS NEEDED 07/28/20   [name], MD  TRUEplus Lancets 33G MISC CHECK BLOOD SUGAR FOUR TIMES DAILY  AND AS NEEDED Patient taking differently: 1 each by Other route See admin instructions. CHECK BLOOD SUGAR FOUR TIMES DAILY  AND AS NEEDED 10/04/19   [name], MD  Vitamin D, Cholecalciferol, 1000 units CAPS Take 1,000 Units by mouth daily.     [provider]    Allergies    Patient has no known allergies.  Review of Systems   Review of Systems  All other systems reviewed and are negative.   Physical Exam Updated Vital Signs BP (!) 148/65   Pulse 62   Temp 97.8 F (36.6 C) (Oral)   Resp 17   SpO2 100%   Physical Exam Vitals and nursing note reviewed.  Constitutional:      General: He is not in acute distress.    Appearance: Normal appearance. He is well-developed.  HENT:     Head: Normocephalic and atraumatic.  Eyes:  Conjunctiva/sclera: Conjunctivae normal.     Pupils: Pupils are equal, round, and reactive to light.  Cardiovascular:     Rate and Rhythm: Normal rate and regular rhythm.     Heart sounds: Normal heart sounds.  Pulmonary:     Effort: Pulmonary effort is normal. No respiratory distress.     Breath sounds: Normal breath sounds.  Abdominal:     General: There is no distension.     Palpations: Abdomen is soft.     Tenderness: There is no abdominal tenderness.  Musculoskeletal:        General: No deformity. Normal range of motion.     Cervical back: Normal range of motion and neck supple.  Skin:    General: Skin is warm and dry.  Neurological:     General: No focal deficit present.     Mental Status: He is alert and oriented to person, place, and time.     ED Results / Procedures / Treatments   Labs (all labs ordered are listed, but only abnormal results are displayed) Labs Reviewed  BASIC METABOLIC PANEL - Abnormal; Notable for the following components:      Result Value   CO2 21 (*)    Glucose, Bld 103 (*)    Creatinine, Ser 1.99 (*)    GFR, Estimated 34 (*)    All other components within normal limits  URINALYSIS, ROUTINE W REFLEX MICROSCOPIC - Abnormal; Notable for the following components:   Hgb urine dipstick SMALL (*)    Protein, ur 100 (*)    All other components within normal limits  CBG MONITORING, ED - Abnormal; Notable for the following components:    Glucose-Capillary 101 (*)    All other components within normal limits  CBG MONITORING, ED - Abnormal; Notable for the following components:   Glucose-Capillary 145 (*)    All other components within normal limits  CBC    EKG None  Radiology No results found.  Procedures Procedures   Medications Ordered in ED Medications - No data to display  ED Course  I have reviewed the triage vital signs and the nursing notes.  Pertinent labs & imaging results that were available during my care of the patient were reviewed by me and considered in my medical decision making (see chart for details).    MDM Rules/Calculators/A&P                          MDM  MSE complete  Seiji Wiswell was evaluated in Emergency Department on 11/21/2020 for the symptoms described in the history of present illness. He was evaluated in the context of the global COVID-19 pandemic, which necessitated consideration that the patient might be at risk for infection with the SARS-CoV-2 virus that causes COVID-19. Institutional protocols and algorithms that pertain to the evaluation of patients at risk for COVID-19 are in a state of rapid change based on information released by regulatory bodies including the CDC and federal and state organizations. These policies and algorithms were followed during the patient's care in the ED.   Patient presented with apparent hypoglycemia.  This was corrected in the field.  Patient has returned to normal mental status upon arrival to the ED.  Patient's screening labs are without significant abnormality.  Patient's hyperglycemia is likely related to lack of good p.o. intake earlier today after administering insulin.  Patient is encouraged to eat regular meals.  Strict return precautions given and understood.  Importance of  close follow-up stressed.   Final Clinical Impression(s) / ED Diagnoses Final diagnoses:  Hypoglycemia    Rx / DC Orders ED Discharge Orders    None        Valarie Merino, MD 11/21/20 [phone]

## 2020-11-21 NOTE — ED Notes (Signed)
David Ford (wife)- [phone]

## 2020-11-21 NOTE — ED Triage Notes (Signed)
Pt to triage via [facility] from home.  Wife came home and found pt unresponsive and drooling.  CBG 40 on EMS arrival.  IV- 22g R hand.  25g Dextrose IV given.  CBG 186.  He ate peanut butter crackers and feels "fuzzy headed".  Alert and oriented but slow to answer questions.

## 2020-11-21 NOTE — ED Notes (Signed)
Pt informed on the need for a urine sample. Pt states he is unable to provide a sample at this time but will try later. Urinal at the bedside.

## 2020-11-21 NOTE — Discharge Instructions (Signed)
Return for any problem.  ?

## 2020-11-21 NOTE — ED Provider Notes (Signed)
Emergency Medicine Provider Triage Evaluation Note  David Ford , a 80 y.o. male  was evaluated in triage.  Pt complains of unresponsive and hyperglycemia.  Patient admits to being a diabetic and administering his own insulin.  He was found unresponsive by his wife and had a glucose of 40 per EMS.  He was given 25 g of dextrose IV and glucose improved to 186.  He admitted to feeling fuzzy headed.  He feels normal now.  Denies being in any pain. He does not think he took too much insulin.  Review of Systems  Positive: Lightheaded Negative: Fever, chest pain, abdominal pain  Physical Exam  BP (!) 160/70 (BP Location: Left Arm)   Pulse 62   Temp 97.8 F (36.6 C) (Oral)   Resp 14   SpO2 99%  Gen:   Awake, no distress   Resp:  Normal effort  MSK:   Moves extremities without difficulty  Other:  Strong and equal grip strength in all extremities. Clear speech, no facial droop.  Medical Decision Making  Medically screening exam initiated at 5:38 PM.  Appropriate orders placed.  David Ford was informed that the remainder of the evaluation will be completed by another provider, this initial triage assessment does not replace that evaluation, and the importance of remaining in the ED until their evaluation is complete.  Basic labs ordered. Glucose 101 in triage, patient provided with snack so glucose does not drop while he is waiting for room.   Barrie Folk, PA-C 11/21/20 1741    Valarie Merino, MD 11/22/20 [phone]

## 2020-12-04 NOTE — Progress Notes (Signed)
error 

## 2020-12-11 ENCOUNTER — Ambulatory Visit: Payer: Medicare HMO | Admitting: Neurology

## 2020-12-11 DIAGNOSIS — Z029 Encounter for administrative examinations, unspecified: Secondary | ICD-10-CM

## 2020-12-14 ENCOUNTER — Encounter: Payer: Self-pay | Admitting: Neurology

## 2021-01-01 ENCOUNTER — Other Ambulatory Visit: Payer: Self-pay

## 2021-01-01 ENCOUNTER — Ambulatory Visit (INDEPENDENT_AMBULATORY_CARE_PROVIDER_SITE_OTHER): Payer: Medicare HMO | Admitting: Family Medicine

## 2021-01-01 ENCOUNTER — Encounter: Payer: Self-pay | Admitting: Family Medicine

## 2021-01-01 VITALS — BP 130/58 | HR 56 | Temp 97.6°F | Ht 73.0 in | Wt 194.0 lb

## 2021-01-01 DIAGNOSIS — IMO0002 Reserved for concepts with insufficient information to code with codable children: Secondary | ICD-10-CM

## 2021-01-01 DIAGNOSIS — E1129 Type 2 diabetes mellitus with other diabetic kidney complication: Secondary | ICD-10-CM

## 2021-01-01 DIAGNOSIS — E1159 Type 2 diabetes mellitus with other circulatory complications: Secondary | ICD-10-CM | POA: Diagnosis not present

## 2021-01-01 DIAGNOSIS — I152 Hypertension secondary to endocrine disorders: Secondary | ICD-10-CM | POA: Diagnosis not present

## 2021-01-01 DIAGNOSIS — E1165 Type 2 diabetes mellitus with hyperglycemia: Secondary | ICD-10-CM | POA: Diagnosis not present

## 2021-01-01 LAB — POCT GLYCOSYLATED HEMOGLOBIN (HGB A1C): Hemoglobin A1C: 6.9 % — AB (ref 4.0–5.6)

## 2021-01-01 NOTE — Progress Notes (Signed)
   David Ford is a 80 y.o. male who presents today for an office visit.  Assessment/Plan:  Chronic Problems Addressed Today: DM type 2, uncontrolled, with renal complications (HCC) F0Y 6.9.  He had an episode of hypoglycemia about 6 weeks ago that resulted in him going to emergency room.  Fasting sugars usually in the 90-120 range.  Has not had any other symptomatic lows.  We will decrease his Lantus to 30 units daily.  Continue Jardiance 10 mg daily.  Continue lifestyle modifications.  Follow-up in 3 months to recheck A1c.  Hypertension associated with diabetes ([place]) At goal.  Continue Norvasc 10 mg daily, Coreg 3.125 mg twice daily.  Follow-up in 3 months.     Subjective:  HPI:  See A/p.         Objective:  Physical Exam: BP (!) 130/58   Pulse (!) 56   Temp 97.6 F (36.4 C) (Temporal)   Ht 6\' 1"  (1.854 m)   Wt 194 lb (88 kg)   SpO2 99%   BMI 25.60 kg/m   Gen: No acute distress, resting comfortably CV: Regular rate and rhythm with no murmurs appreciated Pulm: Normal work of breathing, clear to auscultation bilaterally with no crackles, wheezes, or rhonchi Neuro: Grossly normal, moves all extremities Psych: Normal affect and thought content      David Ford M. David Pain, MD 01/01/2021 10:02 AM

## 2021-01-01 NOTE — Patient Instructions (Signed)
It was very nice to see you today!  Your A1c is 6.9.  Please decrease your Lantus from 34 units to 30 units.  We will continue current dose of Jardiance.  I will see you back in 6 months to recheck your A1c.  Please come back to see me sooner if needed.  Take care, Dr Jerline Pain  PLEASE NOTE:  If you had any lab tests please let us know if you have not heard back within a few days. You may see your results on mychart before we have a chance to review them but we will give you a call once they are reviewed by [place]. If we ordered any referrals today, please let us know if you have not heard from their office within the next week.   Please try these tips to maintain a healthy lifestyle:  Eat at least 3 REAL meals and 1-2 snacks per day.  Aim for no more than 5 hours between eating.  If you eat breakfast, please do so within one hour of getting up.   Each meal should contain half fruits/vegetables, one quarter protein, and one quarter carbs (no bigger than a computer mouse)  Cut down on sweet beverages. This includes juice, soda, and sweet tea.   Drink at least 1 glass of water with each meal and aim for at least 8 glasses per day  Exercise at least 150 minutes every week.

## 2021-01-01 NOTE — Assessment & Plan Note (Signed)
A1c 6.9.  He had an episode of hypoglycemia about 6 weeks ago that resulted in him going to emergency room.  Fasting sugars usually in the 90-120 range.  Has not had any other symptomatic lows.  We will decrease his Lantus to 30 units daily.  Continue Jardiance 10 mg daily.  Continue lifestyle modifications.  Follow-up in 3 months to recheck A1c.

## 2021-01-01 NOTE — Assessment & Plan Note (Signed)
At goal.  Continue Norvasc 10 mg daily, Coreg 3.125 mg twice daily.  Follow-up in 3 months.

## 2021-01-23 ENCOUNTER — Other Ambulatory Visit: Payer: Self-pay | Admitting: Family Medicine

## 2021-01-29 ENCOUNTER — Other Ambulatory Visit: Payer: Self-pay | Admitting: Family Medicine

## 2021-02-12 ENCOUNTER — Other Ambulatory Visit: Payer: Self-pay | Admitting: Family Medicine

## 2021-03-23 ENCOUNTER — Other Ambulatory Visit: Payer: Self-pay | Admitting: *Deleted

## 2021-03-23 MED ORDER — LANTUS SOLOSTAR 100 UNIT/ML ~~LOC~~ SOPN: PEN_INJECTOR | SUBCUTANEOUS | 1 refills | Status: DC

## 2021-06-04 ENCOUNTER — Other Ambulatory Visit: Payer: Self-pay

## 2021-06-04 ENCOUNTER — Emergency Department (HOSPITAL_COMMUNITY): Payer: Medicare HMO

## 2021-06-04 ENCOUNTER — Emergency Department (HOSPITAL_COMMUNITY)
Admission: EM | Admit: 2021-06-04 | Discharge: 2021-06-04 | Disposition: A | Discharge status: Home / Self Care | Payer: Medicare HMO | Attending: Emergency Medicine | Admitting: Emergency Medicine

## 2021-06-04 ENCOUNTER — Telehealth: Payer: Self-pay | Admitting: Family Medicine

## 2021-06-04 DIAGNOSIS — E11649 Type 2 diabetes mellitus with hypoglycemia without coma: Secondary | ICD-10-CM | POA: Insufficient documentation

## 2021-06-04 DIAGNOSIS — E162 Hypoglycemia, unspecified: Secondary | ICD-10-CM

## 2021-06-04 DIAGNOSIS — R4020 Unspecified coma: Secondary | ICD-10-CM | POA: Diagnosis not present

## 2021-06-04 DIAGNOSIS — I10 Essential (primary) hypertension: Secondary | ICD-10-CM | POA: Diagnosis not present

## 2021-06-04 DIAGNOSIS — Z794 Long term (current) use of insulin: Secondary | ICD-10-CM | POA: Insufficient documentation

## 2021-06-04 DIAGNOSIS — R410 Disorientation, unspecified: Secondary | ICD-10-CM | POA: Diagnosis not present

## 2021-06-04 DIAGNOSIS — N183 Chronic kidney disease, stage 3 unspecified: Secondary | ICD-10-CM | POA: Insufficient documentation

## 2021-06-04 DIAGNOSIS — Z79899 Other long term (current) drug therapy: Secondary | ICD-10-CM | POA: Insufficient documentation

## 2021-06-04 DIAGNOSIS — R4182 Altered mental status, unspecified: Secondary | ICD-10-CM | POA: Diagnosis not present

## 2021-06-04 DIAGNOSIS — Z8546 Personal history of malignant neoplasm of prostate: Secondary | ICD-10-CM | POA: Insufficient documentation

## 2021-06-04 DIAGNOSIS — Z20822 Contact with and (suspected) exposure to covid-19: Secondary | ICD-10-CM | POA: Insufficient documentation

## 2021-06-04 DIAGNOSIS — E1122 Type 2 diabetes mellitus with diabetic chronic kidney disease: Secondary | ICD-10-CM | POA: Insufficient documentation

## 2021-06-04 DIAGNOSIS — Z7984 Long term (current) use of oral hypoglycemic drugs: Secondary | ICD-10-CM | POA: Insufficient documentation

## 2021-06-04 DIAGNOSIS — E1110 Type 2 diabetes mellitus with ketoacidosis without coma: Secondary | ICD-10-CM | POA: Diagnosis not present

## 2021-06-04 DIAGNOSIS — E161 Other hypoglycemia: Secondary | ICD-10-CM | POA: Diagnosis not present

## 2021-06-04 DIAGNOSIS — F69 Unspecified disorder of adult personality and behavior: Secondary | ICD-10-CM | POA: Diagnosis not present

## 2021-06-04 DIAGNOSIS — I129 Hypertensive chronic kidney disease with stage 1 through stage 4 chronic kidney disease, or unspecified chronic kidney disease: Secondary | ICD-10-CM | POA: Diagnosis not present

## 2021-06-04 DIAGNOSIS — R739 Hyperglycemia, unspecified: Secondary | ICD-10-CM | POA: Diagnosis not present

## 2021-06-04 LAB — COMPREHENSIVE METABOLIC PANEL
ALT: 17 U/L (ref 0–44)
AST: 31 U/L (ref 15–41)
Albumin: 3.5 g/dL (ref 3.5–5.0)
Alkaline Phosphatase: 63 U/L (ref 38–126)
Anion gap: 8 (ref 5–15)
BUN: 25 mg/dL — ABNORMAL HIGH (ref 8–23)
CO2: 21 mmol/L — ABNORMAL LOW (ref 22–32)
Calcium: 8.8 mg/dL — ABNORMAL LOW (ref 8.9–10.3)
Chloride: 107 mmol/L (ref 98–111)
Creatinine, Ser: 1.84 mg/dL — ABNORMAL HIGH (ref 0.61–1.24)
GFR, Estimated: 37 mL/min — ABNORMAL LOW (ref >60)
Glucose, Bld: 101 mg/dL — ABNORMAL HIGH (ref 70–99)
Potassium: 4.1 mmol/L (ref 3.5–5.1)
Sodium: 136 mmol/L (ref 135–145)
Total Bilirubin: 0.5 mg/dL (ref 0.3–1.2)
Total Protein: 6.2 g/dL — ABNORMAL LOW (ref 6.5–8.1)

## 2021-06-04 LAB — URINALYSIS, MICROSCOPIC (REFLEX)

## 2021-06-04 LAB — CBC WITH DIFFERENTIAL/PLATELET
Abs Immature Granulocytes: 0.02 10*3/uL (ref 0.00–0.07)
Basophils Absolute: 0 10*3/uL (ref 0.0–0.1)
Basophils Relative: 1 %
Eosinophils Absolute: 0.1 10*3/uL (ref 0.0–0.5)
Eosinophils Relative: 1 %
HCT: 40.2 % (ref 39.0–52.0)
Hemoglobin: 13.3 g/dL (ref 13.0–17.0)
Immature Granulocytes: 0 %
Lymphocytes Relative: 30 %
Lymphs Abs: 1.9 10*3/uL (ref 0.7–4.0)
MCH: 29.2 pg (ref 26.0–34.0)
MCHC: 33.1 g/dL (ref 30.0–36.0)
MCV: 88.4 fL (ref 80.0–100.0)
Monocytes Absolute: 0.5 10*3/uL (ref 0.1–1.0)
Monocytes Relative: 8 %
Neutro Abs: 3.8 10*3/uL (ref 1.7–7.7)
Neutrophils Relative %: 60 %
Platelets: 279 10*3/uL (ref 150–400)
RBC: 4.55 MIL/uL (ref 4.22–5.81)
RDW: 13.3 % (ref 11.5–15.5)
WBC: 6.4 10*3/uL (ref 4.0–10.5)
nRBC: 0 % (ref 0.0–0.2)

## 2021-06-04 LAB — URINALYSIS, ROUTINE W REFLEX MICROSCOPIC
Bilirubin Urine: NEGATIVE
Glucose, UA: NEGATIVE mg/dL
Ketones, ur: NEGATIVE mg/dL
Leukocytes,Ua: NEGATIVE
Nitrite: NEGATIVE
Protein, ur: 100 mg/dL — AB
Specific Gravity, Urine: >1.03 — ABNORMAL HIGH (ref 1.005–1.030)
pH: 5.5 (ref 5.0–8.0)

## 2021-06-04 LAB — RESP PANEL BY RT-PCR (FLU A&B, COVID) ARPGX2
Influenza A by PCR: NEGATIVE
Influenza B by PCR: NEGATIVE
SARS Coronavirus 2 by RT PCR: NEGATIVE

## 2021-06-04 LAB — CBG MONITORING, ED
Glucose-Capillary: 114 mg/dL — ABNORMAL HIGH (ref 70–99)
Glucose-Capillary: 119 mg/dL — ABNORMAL HIGH (ref 70–99)

## 2021-06-04 NOTE — Telephone Encounter (Signed)
David Ford called in and stated that she needs some assistance due to he is not eating, and he has no appeite and his sugar has dropped to 32. And they were just at the hospital this morning, she stated that his HR dropped to 50%. The hh is stating to she needs to make him eat. She doesn't know what to do. And she stated that he can carry on a conversation and then the next he talking strange. And she wants to know if he can a nurse to sit with him. Anything can help.

## 2021-06-04 NOTE — ED Triage Notes (Signed)
Pt arrived via [facility] from home. Per EMS, pt's granddaughter called after pt had AMS described as lethargy x2 days. Pt was found to hypoglycemic at 60 initially, CBG came up to 91 after oral glucose.  Pt also has hx of recurrent UTI. Pt arrived to ED caox4, in no obvious distress, denying any complaints at present.

## 2021-06-04 NOTE — ED Provider Notes (Signed)
[facility] EMERGENCY DEPARTMENT Provider Note   CSN: [id number] Arrival date & time: 06/04/21  0059     History Chief Complaint  Patient presents with   Altered Mental Status   Hypoglycemia    David Ford is a 80 y.o. male.  The history is provided by the patient and medical records.  Altered Mental Status Hypoglycemia Associated symptoms: altered mental status   David Ford is a 80 y.o. male who presents to the Emergency Department complaining of mental status, hypoglycemia.  He presents the emergency department by EMS for evaluation following hypoglycemic episode.  He states that he lives at home with his wife.  He states that he is unsure of the events that brought him to the emergency department but woke up in his bedroom with EMS present.  He states that they gave him apple juice.  He does report over the last several days having poor appetite and eating less.  He denies any fevers, chest pain, abdominal pain, nausea, vomiting, dysuria.  He does have a history of diabetes.  No recent changes to medications or concerns for overtaking medications.  When asked why he is eating less he does states that his wife is out of the home for work frequently and he just has less appetite than usual.  Additional hx available from patient's wife after his initial assessment.  When his wife arrived from the gym.  She found him watching TV, sleeping and tried to wake him and he was difficult to awaken and slow to answer questions.  EMS was called.      Past Medical History:  Diagnosis Date   AKI (acute kidney injury) ([place]) 06/18/2015   Cancer ([place])    hx of prostate cancer   Chronic kidney disease    Diabetes mellitus without complication ([place])    DKA (diabetic ketoacidoses) 06/18/2015   Glaucoma    Hyperlipidemia    Hypertension     Patient Active Problem List   Diagnosis Date Noted   Esophagitis 01/06/2020   Dermatitis 08/20/2018   Glaucoma 01/31/2018   CKD  (chronic kidney disease), Ford III 08/03/2017   Hyperlipidemia associated with type 2 diabetes mellitus ([place]) 06/02/2017   BPH (benign prostatic hyperplasia) 06/02/2017   History of prostate cancer 06/02/2017   Insomnia 06/02/2017   Hypertension associated with diabetes ([place]) 06/17/2015   DM type 2, uncontrolled, with renal complications 35/00/9381    Past Surgical History:  Procedure Laterality Date   AMPUTATION Left 07/03/2015   Procedure: Left Foot 1st and 2nd Ray Amputation;  Surgeon: Newt Minion, MD;  Location: [place];  Service: Orthopedics;  Laterality: Left;   BASCILIC VEIN TRANSPOSITION Left 07/14/2015   Procedure: BRACHIOBASILIC VEIN TRANSPOSITION  ;  Surgeon: Elam Dutch, MD;  Location: [facility] OR;  Service: Vascular;  Laterality: Left;   IRIDOTOMY / IRIDECTOMY     prostate seeds         Family History  Problem Relation Age of Onset   Diabetes Mother    Hypertension Mother    Stroke Mother    Diabetes Sister    Glaucoma Sister    Diabetes Brother    Glaucoma Brother    Amblyopia Neg Hx    Blindness Neg Hx    Cataracts Neg Hx    Macular degeneration Neg Hx    Retinal detachment Neg Hx    Strabismus Neg Hx    Retinitis pigmentosa Neg Hx     Social History  Tobacco Use   Smoking status: Never   Smokeless tobacco: Never  Vaping Use   Vaping Use: Never used  Substance Use Topics   Alcohol use: Yes    Comment: once a week   Drug use: No    Home Medications Prior to Admission medications   Medication Sig Start Date End Date Taking? Authorizing Provider  amLODipine (NORVASC) 10 MG tablet Take 1 tablet (10 mg total) by mouth daily. 10/01/20   [name], MD  atorvastatin (LIPITOR) 10 MG tablet Take 1 tablet (10 mg total) by mouth daily. 10/01/20   [name], MD  Azelastine HCl 137 MCG/SPRAY SOLN USE 2 SPRAYS INTO BOTH NOSTRILS 2 (TWO) TIMES DAILY. 05/18/20   [name], MD  BD PEN NEEDLE NANO U/F 32G X 4 MM MISC USE WITH PEN INJECTOR  DAILY Patient taking differently: 1 each by Other route See admin instructions. Use with pen injector daily 01/03/20   [name], MD  Blood Glucose Monitoring Suppl (TRUE METRIX METER) w/Device KIT USE AS DIRECTED 02/10/20   [name], MD  brimonidine [facility]) 0.2 % ophthalmic solution Place 1 drop into both eyes in the morning and at bedtime. Original SIG was 1 gtt ou tid  But pt states only taking 1 gtt ou bid 06/01/18   [provider]  carvedilol (COREG) 3.125 MG tablet Take 1 tablet (3.125 mg total) by mouth 2 (two) times daily with a meal. 10/01/20   [name], MD  dorzolamide-timolol (COSOPT) 22.3-6.8 MG/ML ophthalmic solution Place 1 drop into both eyes 2 (two) times daily.  04/02/19   [provider]  empagliflozin (JARDIANCE) 10 MG TABS tablet Take 1 tablet (10 mg total) by mouth daily before breakfast. 10/01/20   [name], MD  FLUAD QUADRIVALENT 0.5 ML injection  07/09/20   [provider]  fluticasone (FLONASE) 50 MCG/ACT nasal spray Place 2 sprays into both nostrils daily. 07/24/18   [name], MD  insulin glargine (LANTUS SOLOSTAR) 100 UNIT/ML Solostar Pen INJECT 30 TO 50 UNITS INTO THE SKIN DAILY. 03/23/21   [name], MD  Insulin Pen Needle (PEN NEEDLES) 32G X 5 MM MISC 1 application by Does not apply route 4 (four) times daily as needed (to inject insulin). 08/03/17   [name], MD  latanoprost (XALATAN) 0.005 % ophthalmic solution INSTILL 1 DROP INTO BOTH EYES AT BEDTIME Patient taking differently: Place 1 drop into both eyes at bedtime. 03/29/18   [name], MD  pantoprazole (PROTONIX) 40 MG tablet Take 1 tablet (40 mg total) by mouth 2 (two) times daily. 10/01/20   [name], MD  PFIZER-BIONTECH COVID-19 [facility] 30 MCG/0.3ML injection  07/09/20   [provider]  tamsulosin (FLOMAX) 0.4 MG CAPS capsule Take 1 capsule (0.4 mg total) by mouth daily. 10/01/20   [name], MD  traZODone (DESYREL) 150  MG tablet TAKE 2 TABLETS AT BEDTIME 01/29/21   [name], MD  triamcinolone ointment (KENALOG) 0.5 % APPLY TOPICALLY 2 TIMES DAILY 02/15/21   [name], MD  TRUE METRIX BLOOD GLUCOSE TEST test strip TEST BLOOD SUGAR FOUR TIMES DAILY AND AS NEEDED 07/28/20   [name], MD  TRUEplus Lancets 33G MISC CHECK BLOOD SUGAR FOUR TIMES DAILY  AND AS NEEDED Patient taking differently: 1 each by Other route See admin instructions. CHECK BLOOD SUGAR FOUR TIMES DAILY  AND AS NEEDED 10/04/19   [name], MD  Vitamin D, Cholecalciferol, 1000 units CAPS Take 1,000 Units by mouth daily.    [provider]    Allergies    Patient has no known allergies.  Review of Systems   Review of Systems  All other systems reviewed and are negative.  Physical Exam Updated Vital Signs BP (!) 152/74    Pulse 74    Temp 97.7 F (36.5 C) (Oral)    Resp 13    Ht 6' 1" (1.854 m)    Wt 93 kg    SpO2 100%    BMI 27.05 kg/m   Physical Exam Vitals and nursing note reviewed.  Constitutional:      Appearance: He is well-developed.  HENT:     Head: Normocephalic and atraumatic.  Cardiovascular:     Rate and Rhythm: Normal rate and regular rhythm.     Heart sounds: No murmur heard. Pulmonary:     Effort: Pulmonary effort is normal. No respiratory distress.     Breath sounds: Normal breath sounds.  Abdominal:     Palpations: Abdomen is soft.     Tenderness: There is no abdominal tenderness. There is no guarding or rebound.  Musculoskeletal:        General: No swelling or tenderness.     Comments: 2+ DP pulses bilaterally.  Dry skin on the feet without any open ulcers or wounds.  Skin:    General: Skin is warm and dry.  Neurological:     Mental Status: He is alert and oriented to person, place, and time.     Comments: 5 out of 5 strength in all 4 extremities.  No asymmetry of facial movements.  Fluent speech.  Psychiatric:        Behavior: Behavior normal.    ED Results / Procedures /  Treatments   Labs (all labs ordered are listed, but only abnormal results are displayed) Labs Reviewed  COMPREHENSIVE METABOLIC PANEL - Abnormal; Notable for the following components:      Result Value   CO2 21 (*)    Glucose, Bld 101 (*)    BUN 25 (*)    Creatinine, Ser 1.84 (*)    Calcium 8.8 (*)    Total Protein 6.2 (*)    GFR, Estimated 37 (*)    All other components within normal limits  URINALYSIS, ROUTINE W REFLEX MICROSCOPIC - Abnormal; Notable for the following components:   Specific Gravity, Urine >1.030 (*)    Hgb urine dipstick SMALL (*)    Protein, ur 100 (*)    All other components within normal limits  URINALYSIS, MICROSCOPIC (REFLEX) - Abnormal; Notable for the following components:   Bacteria, UA RARE (*)    All other components within normal limits  CBG MONITORING, ED - Abnormal; Notable for the following components:   Glucose-Capillary 114 (*)    All other components within normal limits  CBG MONITORING, ED - Abnormal; Notable for the following components:   Glucose-Capillary 119 (*)    All other components within normal limits  RESP PANEL BY RT-PCR (FLU A&B, COVID) [id number]  CBC WITH DIFFERENTIAL/PLATELET    EKG EKG Interpretation  Date/Time:  Friday June 04 2021 01:05:58 EST Ventricular Rate:  66 PR Interval:  139 QRS Duration: 159 QT Interval:  442 QTC Calculation: 464 R Axis:   65 Text Interpretation: Sinus rhythm Left bundle branch block No significant change since last tracing Confirmed by Quintella Reichert [phone]) on 06/04/2021 1:19:19 AM  Radiology DG Chest [facility] 1 [address]  Result Date: 06/04/2021 CLINICAL DATA:  Altered mental status EXAM: PORTABLE CHEST 1 VIEW COMPARISON:  12/27/2019 FINDINGS: Lungs are clear.  No pleural effusion or pneumothorax. The heart is normal in size. IMPRESSION: No evidence of acute cardiopulmonary disease. Electronically Signed   By: Julian Hy M.D.   On: 06/04/2021 01:52    Procedures Procedures    Medications Ordered in ED Medications - No data to display  ED Course  I have reviewed the triage vital signs and the nursing notes.  Pertinent labs & imaging results that were available during my care of the patient were reviewed by me and considered in my medical decision making (see chart for details).    MDM Rules/Calculators/A&P                         Patient with history of hypertension, diabetes, Ford III CKD here for evaluation following episode of altered mental status.  He was hypoglycemic on EMS arrival.  Hypoglycemia resolved after apple juice.  He is at his baseline in the emergency department with no acute complaints.  Labs demonstrate stable renal insufficiency.  UA not consistent with UTI.  No evidence of acute infectious process.  Presentation is not consistent with CVA or seizure.  He was observed for several hours in the emergency department without recurrent symptoms.  No recurrent hypoglycemia.  On reassessment it appears he did forget to eat dinner.  His wife is concerned that he might need additional resources at home because he does forget to do things like that at times.  Case management consult placed regarding in-home assistance.  Discussed with patient and wife importance of following up with PCP for reassessment and return precautions.    Final Clinical Impression(s) / ED Diagnoses Final diagnoses:  Hypoglycemia    Rx / DC Orders ED Discharge Orders     None        Quintella Reichert, MD 06/04/21 0501

## 2021-06-07 NOTE — Telephone Encounter (Signed)
Called pt to advise scheduling an appt soon, no vm set up to leave a msg.

## 2021-06-07 NOTE — Telephone Encounter (Signed)
Looks like they went to the ED for this. Recommend he come in for follow up visit soon.  [name]. Jerline Pain, MD 06/07/2021 8:12 AM

## 2021-06-07 NOTE — Telephone Encounter (Signed)
Please see note.

## 2021-06-08 ENCOUNTER — Encounter: Payer: Self-pay | Admitting: Family Medicine

## 2021-06-08 ENCOUNTER — Ambulatory Visit (INDEPENDENT_AMBULATORY_CARE_PROVIDER_SITE_OTHER): Payer: Medicare HMO | Admitting: Family Medicine

## 2021-06-08 VITALS — BP 138/78 | HR 55 | Temp 97.6°F | Ht 73.0 in | Wt 191.0 lb

## 2021-06-08 DIAGNOSIS — R4189 Other symptoms and signs involving cognitive functions and awareness: Secondary | ICD-10-CM

## 2021-06-08 DIAGNOSIS — I152 Hypertension secondary to endocrine disorders: Secondary | ICD-10-CM | POA: Diagnosis not present

## 2021-06-08 DIAGNOSIS — E1159 Type 2 diabetes mellitus with other circulatory complications: Secondary | ICD-10-CM

## 2021-06-08 DIAGNOSIS — F0390 Unspecified dementia without behavioral disturbance: Secondary | ICD-10-CM | POA: Insufficient documentation

## 2021-06-08 LAB — COMPREHENSIVE METABOLIC PANEL
ALT: 14 U/L (ref 0–53)
AST: 16 U/L (ref 0–37)
Albumin: 3.8 g/dL (ref 3.5–5.2)
Alkaline Phosphatase: 77 U/L (ref 39–117)
BUN: 25 mg/dL — ABNORMAL HIGH (ref 6–23)
CO2: 24 mEq/L (ref 19–32)
Calcium: 9.7 mg/dL (ref 8.4–10.5)
Chloride: 104 mEq/L (ref 96–112)
Creatinine, Ser: 1.75 mg/dL — ABNORMAL HIGH (ref 0.40–1.50)
GFR: 36.53 mL/min — ABNORMAL LOW (ref >60.00)
Glucose, Bld: 168 mg/dL — ABNORMAL HIGH (ref 70–99)
Potassium: 4.6 mEq/L (ref 3.5–5.1)
Sodium: 137 mEq/L (ref 135–145)
Total Bilirubin: 0.4 mg/dL (ref 0.2–1.2)
Total Protein: 7.2 g/dL (ref 6.0–8.3)

## 2021-06-08 LAB — CBC
HCT: 40.5 % (ref 39.0–52.0)
Hemoglobin: 13.3 g/dL (ref 13.0–17.0)
MCHC: 32.9 g/dL (ref 30.0–36.0)
MCV: 87.8 fl (ref 78.0–100.0)
Platelets: 290 10*3/uL (ref 150.0–400.0)
RBC: 4.61 Mil/uL (ref 4.22–5.81)
RDW: 14 % (ref 11.5–15.5)
WBC: 5.4 10*3/uL (ref 4.0–10.5)

## 2021-06-08 LAB — TSH: TSH: 1.83 u[IU]/mL (ref 0.35–5.50)

## 2021-06-08 LAB — POCT GLYCOSYLATED HEMOGLOBIN (HGB A1C): Hemoglobin A1C: 7.1 % — AB (ref 4.0–5.6)

## 2021-06-08 LAB — VITAMIN B12: Vitamin B-12: 551 pg/mL (ref 211–911)

## 2021-06-08 MED ORDER — FREESTYLE LIBRE 2 SENSOR MISC: 11 refills | Status: DC

## 2021-06-08 MED ORDER — FREESTYLE LIBRE 2 READER DEVI: 11 refills | Status: DC

## 2021-06-08 NOTE — Assessment & Plan Note (Signed)
At goal on Norvasc 10 mg daily, Coreg 3.125 mg twice daily.

## 2021-06-08 NOTE — Assessment & Plan Note (Signed)
A1c 7.1.  Has had ongoing issues with hypoglycemia resulting in multiple EMS calls and ED visits.  We will drop his dose of Lantus down to 10 units daily.  Continue Jardiance 10 mg daily.  They will monitor the sugar at home and check in with me in a couple of weeks.  Discussed importance of avoiding lows.  We will allow high sugars to prevent hypoglycemic episodes while we are working on home health aide assistance and possible ALF placement.

## 2021-06-08 NOTE — Progress Notes (Signed)
David Ford is a 80 y.o. male who presents today for an office visit.  Assessment/Plan:  Chronic Problems Addressed Today: Cognitive impairment Had lengthy discussion with patient and his wife today regarding his recent decline.  He had 2 out of 3 delayed word recall today.  He has had recent labs which were normal.  We will check B12 and TSH today.  Depending on results will likely need referral to neurology for further evaluation.  They are concerned with his ability to care for himself at home.  We will place referral to home health and they are also looking into ALF placement.  T2DM (type 2 diabetes mellitus) (HCC) A1c 7.1.  Has had ongoing issues with hypoglycemia resulting in multiple EMS calls and ED visits.  We will drop his dose of Lantus down to 10 units daily.  Continue Jardiance 10 mg daily.  They will monitor the sugar at home and check in with me in a couple of weeks.  Discussed importance of avoiding lows.  We will allow high sugars to prevent hypoglycemic episodes while we are working on home health aide assistance and possible ALF placement.  Hypertension associated with diabetes ([place]) At goal on Norvasc 10 mg daily, Coreg 3.125 mg twice daily.    Subjective:  HPI:  He went to ED for hypoglycemia on 06/04/2021. He was found unresponsive by his wife. EMS was called. He was unsure of the events that happened before he was send to ED. His blood sugar was dropped to 32. He had no appetite at that time and was eating less. Had labs chest x ray in the ED which was unremarkable. Hypoglycemia was resolved in the ED. HE has had multiple hypoglycemic episodes over the last several months.  His wife is concerned about his cognitive function.  He still have issue with poor appetite. He is accompanied with his wife today. She is currently working and is very concerned about his safety at home.  She does not think he takes care of himself or not she is not working.  She is concerned  about the food he is eating.  Will sometimes not eating.  He also has trouble turning on the TV.Marland Kitchen  His wife admits that he has been not taking his insulin. He last took his insulin on 06/04/2021. Marland Kitchen She noticed he sometimes have agressive behavior and feel tired. She notes he say things that do not make sense and he has not been taking care of himself.  She is requesting referral for home health agencies.  He has been complaint with his medications. He notes his sugar usually in the 90's and 120's. It was 242 this morning.         Objective:  Physical Exam: BP 138/78 (BP Location: Left Arm)    Pulse (!) 55    Temp 97.6 F (36.4 C) (Temporal)    Ht 6\' 1"  (1.854 m)    Wt 191 lb (86.6 kg)    SpO2 99%    BMI 25.20 kg/m   Gen: No acute distress, resting comfortably CV: Regular rate and rhythm with no murmurs appreciated Pulm: Normal work of breathing, clear to auscultation bilaterally with no crackles, wheezes, or rhonchi Neuro: Grossly normal, moves all extremities.  Minicog: Abnormal clock face.  2 out of 3 delayed word recall. Psych: Normal affect and thought content       I,Savera Zaman,acting as a scribe for Dimas Chyle, MD.,have documented all relevant documentation on the behalf of Amasa  Jerline Pain, MD,as directed by  Dimas Chyle, MD while in the presence of Dimas Chyle, MD.   I, Dimas Chyle, MD, have reviewed all documentation for this visit. The documentation on 06/08/21 for the exam, diagnosis, procedures, and orders are all accurate and complete.  Time Spent: 45 minutes of total time was spent on the date of the encounter performing the following actions: chart review prior to seeing the patient including recent ED visit, obtaining history, performing a medically necessary exam, counseling on the treatment plan, placing orders, and documenting in our EHR.    [name]. Jerline Pain, MD 06/08/2021 1:56 PM

## 2021-06-08 NOTE — Patient Instructions (Signed)
It was very nice to see you today!  Please decrease your Lantus to 10 units daily.  Send me a message in a few weeks to let me know how your sugars are looking.  We will check blood work today.  I will refer you to the home health agency.  They will help with having a home health aide for now and they will also have a social worker that will help [place] with placement.  I will refer you to see a neurologist to discuss your cognitive decline.  Take care, Dr Jerline Pain  PLEASE NOTE:  If you had any lab tests please let us know if you have not heard back within a few days. You may see your results on mychart before we have a chance to review them but we will give you a call once they are reviewed by [place]. If we ordered any referrals today, please let us know if you have not heard from their office within the next week.   Please try these tips to maintain a healthy lifestyle:  Eat at least 3 REAL meals and 1-2 snacks per day.  Aim for no more than 5 hours between eating.  If you eat breakfast, please do so within one hour of getting up.   Each meal should contain half fruits/vegetables, one quarter protein, and one quarter carbs (no bigger than a computer mouse)  Cut down on sweet beverages. This includes juice, soda, and sweet tea.   Drink at least 1 glass of water with each meal and aim for at least 8 glasses per day  Exercise at least 150 minutes every week.

## 2021-06-08 NOTE — Assessment & Plan Note (Signed)
Had lengthy discussion with patient and his wife today regarding his recent decline.  He had 2 out of 3 delayed word recall today.  He has had recent labs which were normal.  We will check B12 and TSH today.  Depending on results will likely need referral to neurology for further evaluation.  They are concerned with his ability to care for himself at home.  We will place referral to home health and they are also looking into ALF placement.

## 2021-06-09 NOTE — Progress Notes (Signed)
Please inform patient of the following:  Labs are all stable. Do not need to make any changes to his treatment plan at this time. They should be hearing from the referrals we made soon.  I would like for them to check in with [place] in a few weeks with his blood sugar readings

## 2021-06-11 NOTE — Progress Notes (Incomplete)
Assessment/Plan:   David Ford is a very pleasant 80 y.o. year old RH male with risk factors including  age, hypertension, CKD, prostate cancer, insomnia, hyperlipidemia, DM2 with several symptomatic hypoglycemic events requiring presentations to the ED (AMS), latest on 06/04/2021, anxiety, depression and  seen today for evaluation of memory loss. MoCA today     Recommendations:   Memory Loss   MRI brain with/without contrast to assess for underlying structural abnormality and assess vascular load  Neurocognitive testing to further evaluate cognitive concerns and determine underlying cause of memory changes, including potential contribution from sleep, anxiety, or depression  Check B12, TSH Discussed safety both in and out of the home.  Discussed the importance of regular daily schedule with inclusion of crossword puzzles to maintain brain function.  Continue to monitor mood with PCP.  Stay active at least 30 minutes at least 3 times a week.  Naps should be scheduled and should be no longer than 60 minutes and should not occur after 2 PM.  Mediterranean diet is recommended  Folllow up once results above are available   Subjective:    The patient is seen in neurologic consultation at the request of [name], MD for the evaluation of memory.  The patient is accompanied by  who supplements the history. This is a 80 y.o. year old RH  male who has had memory issues for about   His wife notes that sometimes he says "things that they do not make sense "  Memory Repeats same stories and asks same questions Does not know what came to the room for He also was noted to have trouble using appliances at home such as the remote control to watch TV or turning on the TV. Leaving objects  Drive Lives with Mood "he has moments of aggressive behavior, irritable " Depression Irritability CW puzzles Word Finding Board Games Painting Coloring Sleeps Vivid Dreams  Sleepwalking Hallucinations Paranoia Hygiene concerns Bathing Dressing Medications pillbox Finances  Appetite is decreased trouble swallowing.  Cooks.  stove on or the faucet on. Ambulates   Falls Head injuries    Denies headaches, he has a history of glaucoma, denies double vision, dizziness, focal numbness or tingling, unilateral weakness or tremors or anosmia. No history of seizures. Denies urine incontinence, retention, constipation or diarrhea.  Denies OSA, ETOH or Tobacco. Family History   EKG 06/04/2021 QT 442, QTc 464, sinus rhythm, left bundle branch block.  CT of the head without contrast 01/05/2020 taken for AMS shows mildly enlarged ventricles and cortical sulci, mild to moderate patchy white matter low-density area in both cerebral hemispheres, mild to moderate cerebral and cerebellar atrophy, otherwise no acute findings.  Labs : Vitamin B12 06/08/2021 normal 551, TSH normal 1.83, CMP remarkable for creatinine 1.75, BUN 25 and glucose 168, CBC normal Hemoglobin A1c 7.1    No Known Allergies  Current Outpatient Medications  Medication Instructions   amLODipine (NORVASC) 10 mg, Oral, Daily   atorvastatin (LIPITOR) 10 mg, Oral, Daily   Azelastine HCl 137 MCG/SPRAY SOLN USE 2 SPRAYS INTO BOTH NOSTRILS 2 (TWO) TIMES DAILY.   BD PEN NEEDLE NANO U/F 32G X 4 MM MISC USE WITH PEN INJECTOR DAILY   Blood Glucose Monitoring Suppl (TRUE METRIX METER) w/Device KIT USE AS DIRECTED   brimonidine (ALPHAGAN) 0.2 % ophthalmic solution 1 drop, Both Eyes, 2 times daily, Original SIG was 1 gtt ou tid <BR>But pt states only taking 1 gtt ou bid   carvedilol (COREG) 3.125 mg,  Oral, 2 times daily with meals   Continuous Blood Gluc Receiver (FREESTYLE LIBRE 2 READER) DEVI USe 4 times daily to check blood dsugar   Continuous Blood Gluc Sensor (FREESTYLE LIBRE 2 SENSOR) MISC Use 4 times daily to check blood sugar.   dorzolamide-timolol (COSOPT) 22.3-6.8 MG/ML ophthalmic solution 1 drop, Both  Eyes, 2 times daily   empagliflozin (JARDIANCE) 10 mg, Oral, Daily before breakfast   FLUAD QUADRIVALENT 0.5 ML injection No dose, route, or frequency recorded.   fluticasone (FLONASE) 50 MCG/ACT nasal spray 2 sprays, Each Nare, Daily   insulin glargine (LANTUS SOLOSTAR) 100 UNIT/ML Solostar Pen INJECT 30 TO 50 UNITS INTO THE SKIN DAILY.   Insulin Pen Needle (PEN NEEDLES) 32G X 5 MM MISC 1 application, Does not apply, 4 times daily PRN   latanoprost (XALATAN) 0.005 % ophthalmic solution INSTILL 1 DROP INTO BOTH EYES AT BEDTIME   pantoprazole (PROTONIX) 40 mg, Oral, 2 times daily   PFIZER-BIONTECH COVID-19 V[facility] 30 MCG/0.3ML injection No dose, route, or frequency recorded.   tamsulosin (FLOMAX) 0.4 mg, Oral, Daily   traZODone (DESYREL) 150 MG tablet TAKE 2 TABLETS AT BEDTIME   triamcinolone ointment (KENALOG) 0.5 % APPLY TOPICALLY 2 TIMES DAILY   TRUE METRIX BLOOD GLUCOSE TEST test strip TEST BLOOD SUGAR FOUR TIMES DAILY AND AS NEEDED   TRUEplus Lancets 33G MISC CHECK BLOOD SUGAR FOUR TIMES DAILY  AND AS NEEDED   Vitamin D (Cholecalciferol) 1,000 Units, Oral, Daily     VITALS:  There were no vitals filed for this visit.   PHYSICAL EXAM   HEENT:  Normocephalic, atraumatic. The mucous membranes are moist. The superficial temporal arteries are without ropiness or tenderness. Cardiovascular: Regular rate and rhythm. Lungs: Clear to auscultation bilaterally. Neck: There are no carotid bruits noted bilaterally.  NEUROLOGICAL: No flowsheet data found. No flowsheet data found.  No flowsheet data found.   Orientation:  Alert and oriented to person, place and time. No aphasia or dysarthria. Fund of knowledge is appropriate. Recent memory impaired and remote memory intact.  Attention and concentration are normal.  Able to name objects and repeat phrases. Delayed recall  /5 Cranial nerves: There is good facial symmetry. Extraocular muscles are intact and visual fields are full to confrontational  testing. Speech is fluent and clear. Soft palate rises symmetrically and there is no tongue deviation. Hearing is intact to conversational tone. Tone: Tone is good throughout. Sensation: Sensation is intact to light touch and pinprick throughout. Vibration is intact at the bilateral big toe.There is no extinction with double simultaneous stimulation. There is no sensory dermatomal level identified. Coordination: The patient has no difficulty with RAM's or FNF bilaterally. Normal finger to nose  Motor: Strength is 5/5 in the bilateral upper and lower extremities. There is no pronator drift. There are no fasciculations noted. DTR's: Deep tendon reflexes are 2/4 at the bilateral biceps, triceps, brachioradialis, patella and achilles.  Plantar responses are downgoing bilaterally..  He has left foot ray amputation (for diabetes). Gait and Station: The patient is able to ambulate without difficulty.The patient is able to heel toe walk without any difficulty.The patient is able to ambulate in a tandem fashion. The patient is able to stand in the Romberg position.     Thank you for allowing [place] the opportunity to participate in the care of this nice patient. Please do not hesitate to contact us for any questions or concerns.   Total time spent on today's visit was 60 minutes, including both face-to-face time  and nonface-to-face time.  Time included that spent on review of records (prior notes available to me/labs/imaging if pertinent), discussing treatment and goals, answering patient's questions and coordinating care.  Cc:  [name], MD  Sharene Butters 06/11/2021 8:56 AM

## 2021-06-15 ENCOUNTER — Inpatient Hospital Stay (HOSPITAL_COMMUNITY)
Admission: EM | Admit: 2021-06-15 | Discharge: 2021-06-18 | DRG: 639 | Disposition: A | Discharge status: Home Health | Payer: Medicare HMO | Attending: Internal Medicine | Admitting: Internal Medicine

## 2021-06-15 ENCOUNTER — Telehealth: Payer: Self-pay | Admitting: Family Medicine

## 2021-06-15 ENCOUNTER — Emergency Department (HOSPITAL_COMMUNITY): Payer: Medicare HMO

## 2021-06-15 ENCOUNTER — Encounter (HOSPITAL_COMMUNITY): Payer: Self-pay

## 2021-06-15 ENCOUNTER — Ambulatory Visit: Payer: Medicare HMO | Admitting: Physician Assistant

## 2021-06-15 DIAGNOSIS — E1122 Type 2 diabetes mellitus with diabetic chronic kidney disease: Secondary | ICD-10-CM | POA: Diagnosis not present

## 2021-06-15 DIAGNOSIS — D649 Anemia, unspecified: Secondary | ICD-10-CM

## 2021-06-15 DIAGNOSIS — D631 Anemia in chronic kidney disease: Secondary | ICD-10-CM | POA: Diagnosis present

## 2021-06-15 DIAGNOSIS — N189 Chronic kidney disease, unspecified: Secondary | ICD-10-CM | POA: Diagnosis not present

## 2021-06-15 DIAGNOSIS — Z823 Family history of stroke: Secondary | ICD-10-CM

## 2021-06-15 DIAGNOSIS — N179 Acute kidney failure, unspecified: Secondary | ICD-10-CM | POA: Diagnosis not present

## 2021-06-15 DIAGNOSIS — E1159 Type 2 diabetes mellitus with other circulatory complications: Secondary | ICD-10-CM | POA: Diagnosis not present

## 2021-06-15 DIAGNOSIS — I447 Left bundle-branch block, unspecified: Secondary | ICD-10-CM | POA: Diagnosis present

## 2021-06-15 DIAGNOSIS — N400 Enlarged prostate without lower urinary tract symptoms: Secondary | ICD-10-CM | POA: Diagnosis present

## 2021-06-15 DIAGNOSIS — Z8546 Personal history of malignant neoplasm of prostate: Secondary | ICD-10-CM

## 2021-06-15 DIAGNOSIS — Z833 Family history of diabetes mellitus: Secondary | ICD-10-CM | POA: Diagnosis not present

## 2021-06-15 DIAGNOSIS — N1832 Chronic kidney disease, stage 3b: Secondary | ICD-10-CM | POA: Diagnosis present

## 2021-06-15 DIAGNOSIS — E162 Hypoglycemia, unspecified: Secondary | ICD-10-CM | POA: Diagnosis not present

## 2021-06-15 DIAGNOSIS — Z8249 Family history of ischemic heart disease and other diseases of the circulatory system: Secondary | ICD-10-CM

## 2021-06-15 DIAGNOSIS — E11649 Type 2 diabetes mellitus with hypoglycemia without coma: Secondary | ICD-10-CM | POA: Diagnosis not present

## 2021-06-15 DIAGNOSIS — E1165 Type 2 diabetes mellitus with hyperglycemia: Secondary | ICD-10-CM | POA: Diagnosis not present

## 2021-06-15 DIAGNOSIS — R0902 Hypoxemia: Secondary | ICD-10-CM | POA: Diagnosis not present

## 2021-06-15 DIAGNOSIS — I152 Hypertension secondary to endocrine disorders: Secondary | ICD-10-CM | POA: Diagnosis present

## 2021-06-15 DIAGNOSIS — E161 Other hypoglycemia: Secondary | ICD-10-CM | POA: Diagnosis not present

## 2021-06-15 DIAGNOSIS — K4090 Unilateral inguinal hernia, without obstruction or gangrene, not specified as recurrent: Secondary | ICD-10-CM | POA: Diagnosis not present

## 2021-06-15 DIAGNOSIS — Z20822 Contact with and (suspected) exposure to covid-19: Secondary | ICD-10-CM | POA: Diagnosis present

## 2021-06-15 DIAGNOSIS — E785 Hyperlipidemia, unspecified: Secondary | ICD-10-CM | POA: Diagnosis not present

## 2021-06-15 DIAGNOSIS — K449 Diaphragmatic hernia without obstruction or gangrene: Secondary | ICD-10-CM | POA: Diagnosis not present

## 2021-06-15 DIAGNOSIS — K8689 Other specified diseases of pancreas: Secondary | ICD-10-CM | POA: Diagnosis not present

## 2021-06-15 DIAGNOSIS — N183 Chronic kidney disease, stage 3 unspecified: Secondary | ICD-10-CM | POA: Diagnosis not present

## 2021-06-15 DIAGNOSIS — E1169 Type 2 diabetes mellitus with other specified complication: Secondary | ICD-10-CM | POA: Diagnosis present

## 2021-06-15 DIAGNOSIS — R531 Weakness: Secondary | ICD-10-CM

## 2021-06-15 DIAGNOSIS — Z83511 Family history of glaucoma: Secondary | ICD-10-CM

## 2021-06-15 DIAGNOSIS — Z79899 Other long term (current) drug therapy: Secondary | ICD-10-CM

## 2021-06-15 DIAGNOSIS — H409 Unspecified glaucoma: Secondary | ICD-10-CM | POA: Diagnosis present

## 2021-06-15 DIAGNOSIS — Z7984 Long term (current) use of oral hypoglycemic drugs: Secondary | ICD-10-CM

## 2021-06-15 DIAGNOSIS — R000 Tachycardia, unspecified: Secondary | ICD-10-CM | POA: Diagnosis not present

## 2021-06-15 DIAGNOSIS — Z794 Long term (current) use of insulin: Secondary | ICD-10-CM

## 2021-06-15 DIAGNOSIS — R112 Nausea with vomiting, unspecified: Secondary | ICD-10-CM

## 2021-06-15 DIAGNOSIS — K8020 Calculus of gallbladder without cholecystitis without obstruction: Secondary | ICD-10-CM | POA: Diagnosis not present

## 2021-06-15 DIAGNOSIS — I129 Hypertensive chronic kidney disease with stage 1 through stage 4 chronic kidney disease, or unspecified chronic kidney disease: Secondary | ICD-10-CM | POA: Diagnosis not present

## 2021-06-15 DIAGNOSIS — R4182 Altered mental status, unspecified: Secondary | ICD-10-CM | POA: Diagnosis not present

## 2021-06-15 DIAGNOSIS — I10 Essential (primary) hypertension: Secondary | ICD-10-CM | POA: Diagnosis not present

## 2021-06-15 LAB — RESP PANEL BY RT-PCR (FLU A&B, COVID) ARPGX2
Influenza A by PCR: NEGATIVE
Influenza B by PCR: NEGATIVE
SARS Coronavirus 2 by RT PCR: NEGATIVE

## 2021-06-15 LAB — BASIC METABOLIC PANEL
Anion gap: 10 (ref 5–15)
BUN: 33 mg/dL — ABNORMAL HIGH (ref 8–23)
CO2: 26 mmol/L (ref 22–32)
Calcium: 9.3 mg/dL (ref 8.9–10.3)
Chloride: 106 mmol/L (ref 98–111)
Creatinine, Ser: 2.26 mg/dL — ABNORMAL HIGH (ref 0.61–1.24)
GFR, Estimated: 29 mL/min — ABNORMAL LOW (ref >60)
Glucose, Bld: 49 mg/dL — ABNORMAL LOW (ref 70–99)
Potassium: 4 mmol/L (ref 3.5–5.1)
Sodium: 142 mmol/L (ref 135–145)

## 2021-06-15 LAB — CBC WITH DIFFERENTIAL/PLATELET
Abs Immature Granulocytes: 0.07 10*3/uL (ref 0.00–0.07)
Basophils Absolute: 0 10*3/uL (ref 0.0–0.1)
Basophils Relative: 0 %
Eosinophils Absolute: 0 10*3/uL (ref 0.0–0.5)
Eosinophils Relative: 0 %
HCT: 33.9 % — ABNORMAL LOW (ref 39.0–52.0)
Hemoglobin: 10.9 g/dL — ABNORMAL LOW (ref 13.0–17.0)
Immature Granulocytes: 1 %
Lymphocytes Relative: 11 %
Lymphs Abs: 1.6 10*3/uL (ref 0.7–4.0)
MCH: 29.2 pg (ref 26.0–34.0)
MCHC: 32.2 g/dL (ref 30.0–36.0)
MCV: 90.9 fL (ref 80.0–100.0)
Monocytes Absolute: 1.1 10*3/uL — ABNORMAL HIGH (ref 0.1–1.0)
Monocytes Relative: 7 %
Neutro Abs: 11.8 10*3/uL — ABNORMAL HIGH (ref 1.7–7.7)
Neutrophils Relative %: 81 %
Platelets: 235 10*3/uL (ref 150–400)
RBC: 3.73 MIL/uL — ABNORMAL LOW (ref 4.22–5.81)
RDW: 14.3 % (ref 11.5–15.5)
WBC: 14.5 10*3/uL — ABNORMAL HIGH (ref 4.0–10.5)
nRBC: 0 % (ref 0.0–0.2)

## 2021-06-15 LAB — COMPREHENSIVE METABOLIC PANEL
ALT: 7 U/L (ref 0–44)
AST: 18 U/L (ref 15–41)
Albumin: 1.8 g/dL — ABNORMAL LOW (ref 3.5–5.0)
Alkaline Phosphatase: 29 U/L — ABNORMAL LOW (ref 38–126)
Anion gap: 5 (ref 5–15)
BUN: 19 mg/dL (ref 8–23)
CO2: 15 mmol/L — ABNORMAL LOW (ref 22–32)
Calcium: 4.9 mg/dL — CL (ref 8.9–10.3)
Chloride: 124 mmol/L — ABNORMAL HIGH (ref 98–111)
Creatinine, Ser: 1.24 mg/dL (ref 0.61–1.24)
GFR, Estimated: 59 mL/min — ABNORMAL LOW (ref >60)
Glucose, Bld: 201 mg/dL — ABNORMAL HIGH (ref 70–99)
Potassium: 2.6 mmol/L — CL (ref 3.5–5.1)
Sodium: 144 mmol/L (ref 135–145)
Total Bilirubin: 0.6 mg/dL (ref 0.3–1.2)
Total Protein: 3.6 g/dL — ABNORMAL LOW (ref 6.5–8.1)

## 2021-06-15 LAB — CBG MONITORING, ED
Glucose-Capillary: 98 mg/dL (ref 70–99)
Glucose-Capillary: 53 mg/dL — ABNORMAL LOW (ref 70–99)
Glucose-Capillary: 151 mg/dL — ABNORMAL HIGH (ref 70–99)
Glucose-Capillary: 100 mg/dL — ABNORMAL HIGH (ref 70–99)

## 2021-06-15 LAB — GLUCOSE, CAPILLARY: Glucose-Capillary: 115 mg/dL — ABNORMAL HIGH (ref 70–99)

## 2021-06-15 MED ORDER — LACTATED RINGERS IV SOLN: INTRAVENOUS | Status: AC

## 2021-06-15 MED ORDER — ENOXAPARIN SODIUM 30 MG/0.3ML IJ SOSY
30.0000 mg | PREFILLED_SYRINGE | INTRAMUSCULAR | Status: DC
  Administered 2021-06-16 – 2021-06-17 (×2): 30 mg via SUBCUTANEOUS
  Filled 2021-06-15 (×2): qty 0.3

## 2021-06-15 MED ORDER — DEXTROSE 5 % IV SOLN: Freq: Once | INTRAVENOUS | Status: AC

## 2021-06-15 MED ORDER — SODIUM CHLORIDE 0.9 % IV BOLUS
1000.0000 mL | Freq: Once | INTRAVENOUS | Status: AC
  Administered 2021-06-15: 20:00:00 1000 mL via INTRAVENOUS

## 2021-06-15 MED ORDER — DEXTROSE 50 % IV SOLN
1.0000 | Freq: Once | INTRAVENOUS | Status: AC
  Administered 2021-06-15: 20:00:00 50 mL via INTRAVENOUS
  Filled 2021-06-15: qty 50

## 2021-06-15 NOTE — Progress Notes (Signed)
I did pt's nursing admission history. Pt's wife is with him and she called his son while I was in the room. They both state that pt does not have a court appointed legal guardian. Chart is marked that he does have a court appointed legal guardian. There is a note from [place]  at 2:49 pm stating that the Probation officer of the note spoke with pt's legal guardian but does not state who legal guardian is.  Lucius Conn BSN, RN-BC Admissions RN 06/15/2021 8:46 PM

## 2021-06-15 NOTE — H&P (Addendum)
History and Physical    David Ford KZS:[id number] DOB: [birth year] DOA: 06/15/2021  PCP: [name], MD  Patient coming from: Home  I have personally briefly reviewed patient's old medical records in [place]  Chief Complaint: AMS, incontience  HPI: David Ford is a 80 y.o. male with medical history significant for insulin-dependent type 2 diabetes, hypertension, hyperlipidemia, CKD stage III, history of prostate cancer and cognitive impairment who presents with concerns of hypoglycemia.  Wife at bedside provides history.  She says that in the past 2 weeks they have called EMS to the house 6 times due to hypoglycemia.  He used to take 30 units of Lantus daily but last saw his PCP on 12/20 and had cut down to 10 units.  He did not take his dose this morning.  However wife noted today that he was more confused, weak and had an episode of incontinence laying in bed which is unusual for him.  On EMS arrival, he was noted to be hypoglycemic with BG of 45 with improvement up to 147 after D10.  He again had recurrent hypoglycemia in the ED down to 53 so hospitalist was called for further management.  ED Course: He was afebrile, normotensive on room air.  WBC of 14.5, hemoglobin of 10.9 down from 13-week ago. Sodium of 142, K of 4, creatinine of 2.26 from a prior of 1.75.  UA is negative for UTI but had greater than 1.03 specific gravity and 100+ protein.  Review of Systems: Pertinent positives and negatives above as patient provides only limited history  Social history Patient lives at home with his young wife. He is able to ambulate but needs assistance from wife on all other ADLS.   Past Medical History:  Diagnosis Date   AKI (acute kidney injury) ([place]) 06/18/2015   Cancer ([place])    hx of prostate cancer   Chronic kidney disease    Diabetes mellitus without complication ([place])    DKA (diabetic ketoacidoses) 06/18/2015   Glaucoma    Hyperlipidemia    Hypertension      Past Surgical History:  Procedure Laterality Date   AMPUTATION Left 07/03/2015   Procedure: Left Foot 1st and 2nd Ray Amputation;  Surgeon: Newt Minion, MD;  Location: [facility];  Service: Orthopedics;  Laterality: Left;   BASCILIC VEIN TRANSPOSITION Left 07/14/2015   Procedure: BRACHIOBASILIC VEIN TRANSPOSITION  ;  Surgeon: Elam Dutch, MD;  Location: [facility] OR;  Service: Vascular;  Laterality: Left;   IRIDOTOMY / IRIDECTOMY     prostate seeds        No Known Allergies  Family History  Problem Relation Age of Onset   Diabetes Mother    Hypertension Mother    Stroke Mother    Diabetes Sister    Glaucoma Sister    Diabetes Brother    Glaucoma Brother    Amblyopia Neg Hx    Blindness Neg Hx    Cataracts Neg Hx    Macular degeneration Neg Hx    Retinal detachment Neg Hx    Strabismus Neg Hx    Retinitis pigmentosa Neg Hx      Prior to Admission medications   Medication Sig Start Date End Date Taking? Authorizing Provider  amLODipine (NORVASC) 10 MG tablet Take 1 tablet (10 mg total) by mouth daily. 10/01/20   [name], MD  atorvastatin (LIPITOR) 10 MG tablet Take 1 tablet (10 mg total) by mouth daily. 10/01/20   [name], MD  Azelastine HCl 137 MCG/SPRAY SOLN USE 2 SPRAYS INTO BOTH NOSTRILS 2 (TWO) TIMES DAILY. 05/18/20   [name], MD  BD PEN NEEDLE NANO U/F 32G X 4 MM MISC USE WITH PEN INJECTOR DAILY Patient not taking: Reported on 06/08/2021 01/03/20   [name], MD  Blood Glucose Monitoring Suppl (TRUE METRIX METER) w/Device KIT USE AS DIRECTED 02/10/20   [name], MD  brimonidine [facility]) 0.2 % ophthalmic solution Place 1 drop into both eyes in the morning and at bedtime. Original SIG was 1 gtt ou tid  But pt states only taking 1 gtt ou bid 06/01/18   [provider]  carvedilol (COREG) 3.125 MG tablet Take 1 tablet (3.125 mg total) by mouth 2 (two) times daily with a meal. 10/01/20   [name], MD  Continuous Blood Gluc  Receiver (FREESTYLE LIBRE 2 READER) DEVI USe 4 times daily to check blood dsugar 06/08/21   [name], MD  Continuous Blood Gluc Sensor (FREESTYLE LIBRE 2 SENSOR) MISC Use 4 times daily to check blood sugar. 06/08/21   [name], MD  dorzolamide-timolol (COSOPT) 22.3-6.8 MG/ML ophthalmic solution Place 1 drop into both eyes 2 (two) times daily.  04/02/19   [provider]  empagliflozin (JARDIANCE) 10 MG TABS tablet Take 1 tablet (10 mg total) by mouth daily before breakfast. 10/01/20   [name], MD  FLUAD QUADRIVALENT 0.5 ML injection  07/09/20   [provider]  fluticasone (FLONASE) 50 MCG/ACT nasal spray Place 2 sprays into both nostrils daily. 07/24/18   [name], MD  insulin glargine (LANTUS SOLOSTAR) 100 UNIT/ML Solostar Pen INJECT 30 TO 50 UNITS INTO THE SKIN DAILY. Patient not taking: Reported on 06/08/2021 03/23/21   [name], MD  Insulin Pen Needle (PEN NEEDLES) 32G X 5 MM MISC 1 application by Does not apply route 4 (four) times daily as needed (to inject insulin). 08/03/17   [name], MD  latanoprost (XALATAN) 0.005 % ophthalmic solution INSTILL 1 DROP INTO BOTH EYES AT BEDTIME Patient taking differently: Place 1 drop into both eyes at bedtime. 03/29/18   [name], MD  pantoprazole (PROTONIX) 40 MG tablet Take 1 tablet (40 mg total) by mouth 2 (two) times daily. 10/01/20   [name], MD  PFIZER-BIONTECH COVID-19 [facility] 30 MCG/0.3ML injection  07/09/20   [provider]  tamsulosin (FLOMAX) 0.4 MG CAPS capsule Take 1 capsule (0.4 mg total) by mouth daily. 10/01/20   [name], MD  traZODone (DESYREL) 150 MG tablet TAKE 2 TABLETS AT BEDTIME 01/29/21   [name], MD  triamcinolone ointment (KENALOG) 0.5 % APPLY TOPICALLY 2 TIMES DAILY 02/15/21   [name], MD  TRUE METRIX BLOOD GLUCOSE TEST test strip TEST BLOOD SUGAR FOUR TIMES DAILY AND AS NEEDED 07/28/20   [name], MD  TRUEplus Lancets 33G MISC  CHECK BLOOD SUGAR FOUR TIMES DAILY  AND AS NEEDED Patient taking differently: 1 each by Other route See admin instructions. CHECK BLOOD SUGAR FOUR TIMES DAILY  AND AS NEEDED 10/04/19   [name], MD  Vitamin D, Cholecalciferol, 1000 units CAPS Take 1,000 Units by mouth daily.    [provider]    Physical Exam: Vitals:   06/15/21 1715 06/15/21 1800 06/15/21 1830 06/15/21 2149  BP: (!) 167/71 (!) 167/71 138/88 (!) 153/56  Pulse: 80 77 82 70  Resp: 16 18 19 16   Temp:  98.3 F (36.8 C)  TempSrc:    Oral  SpO2: 97% 97% 95% 98%    Constitutional: NAD, calm, comfortable, elderly gentleman lying flat in bed Vitals:   06/15/21 1715 06/15/21 1800 06/15/21 1830 06/15/21 2149  BP: (!) 167/71 (!) 167/71 138/88 (!) 153/56  Pulse: 80 77 82 70  Resp: 16 18 19 16   Temp:    98.3 F (36.8 C)  TempSrc:    Oral  SpO2: 97% 97% 95% 98%   Eyes: lids and conjunctivae normal ENMT: Mucous membranes are moist.  Neck: normal, supple Respiratory: clear to auscultation bilaterally, no wheezing, no crackles. Normal respiratory effort. No accessory muscle use.  Cardiovascular: Regular rate and rhythm, no murmurs / rubs / gallops. No extremity edema.  Abdomen: no tenderness, no masses palpated. . Bowel sounds positive.  Musculoskeletal: no clubbing / cyanosis. No joint deformity upper and lower extremities. Normal muscle tone.  Skin: no rashes, lesions, ulcers. No induration Neurologic: CN 2-12 grossly intact. Strength 5/5 in all 4.  Psychiatric: Normal judgment and insight. Alert and oriented x 3. Normal mood.     Labs on Admission: I have personally reviewed following labs and imaging studies  CBC: Recent Labs  Lab 06/15/21 1614  WBC 14.5*  NEUTROABS 11.8*  HGB 10.9*  HCT 33.9*  MCV 90.9  PLT 053   Basic Metabolic Panel: Recent Labs  Lab 06/15/21 1614 06/15/21 1719  NA 144 142  K 2.6* 4.0  CL 124* 106  CO2 15* 26  GLUCOSE 201* 49*  BUN 19 33*  CREATININE 1.24 2.26*   CALCIUM 4.9* 9.3   GFR: Estimated Creatinine Clearance: 30 mL/min (A) (by C-G formula based on SCr of 2.26 mg/dL (H)). Liver Function Tests: Recent Labs  Lab 06/15/21 1614  AST 18  ALT 7  ALKPHOS 29*  BILITOT 0.6  PROT 3.6*  ALBUMIN 1.8*   No results for input(s): LIPASE, AMYLASE in the last 168 hours. No results for input(s): AMMONIA in the last 168 hours. Coagulation Profile: No results for input(s): INR, PROTIME in the last 168 hours. Cardiac Enzymes: No results for input(s): CKTOTAL, CKMB, CKMBINDEX, TROPONINI in the last 168 hours. BNP (last 3 results) No results for input(s): PROBNP in the last 8760 hours. HbA1C: No results for input(s): HGBA1C in the last 72 hours. CBG: Recent Labs  Lab 06/15/21 1359 06/15/21 2001 06/15/21 2033 06/15/21 2148  GLUCAP 98 53* 151* 100*   Lipid Profile: No results for input(s): CHOL, HDL, LDLCALC, TRIG, CHOLHDL, LDLDIRECT in the last 72 hours. Thyroid Function Tests: No results for input(s): TSH, T4TOTAL, FREET4, T3FREE, THYROIDAB in the last 72 hours. Anemia Panel: No results for input(s): VITAMINB12, FOLATE, FERRITIN, TIBC, IRON, RETICCTPCT in the last 72 hours. Urine analysis:    Component Value Date/Time   COLORURINE YELLOW 06/04/2021 0205   APPEARANCEUR CLEAR 06/04/2021 0205   LABSPEC >1.030 (H) 06/04/2021 0205   PHURINE 5.5 06/04/2021 0205   GLUCOSEU NEGATIVE 06/04/2021 0205   HGBUR SMALL (A) 06/04/2021 0205   BILIRUBINUR NEGATIVE 06/04/2021 0205   KETONESUR NEGATIVE 06/04/2021 0205   PROTEINUR 100 (A) 06/04/2021 0205   NITRITE NEGATIVE 06/04/2021 0205   LEUKOCYTESUR NEGATIVE 06/04/2021 0205    Radiological Exams on Admission: DG Chest Portable 1 View  Result Date: 06/15/2021 CLINICAL DATA:  Hypoglycemia.  Evaluate for occult infection EXAM: PORTABLE CHEST 1 VIEW COMPARISON:  06/04/2021. FINDINGS: Normal heart size. No pleural effusion or edema identified. No airspace opacities. The visualized osseous structures  are unremarkable. IMPRESSION:  No active disease. Electronically Signed   By: Kerby Moors M.D.   On: 06/15/2021 16:27      Assessment/Plan  Hypoglycemia Hemoglobin A1c of 7.1 On Lantus 10 units and 10 mg of Jardiance at home -suspect hypoglycemia caused by dehydration, worsening renal function while on insulin  -hold all home meds -check CBG q2hrs and if glucose remains stable then q4hrs after   Acute on CKD stage 3 -creatinine of 2.26 up from 1.75 -continuous IV LR infusion overnight -Avoid nephrotoxic agent, renally dose medication and avoid contrast studies  Acute anemia-normocytic  Hgb of 10 down from 13 denies any dark stools recent B12 normal check FOBT, iron panel   HTN continue Norvasc and Coreg  HLD Continue statin  DVT prophylaxis:.Lovenox Code Status: Full Family Communication: Plan discussed with patient and wife at bedside  disposition Plan: Home with at least 2 midnight stays  Consults called:  Admission status: inpatient   Level of care: Progressive  Status is: Inpatient  Remains inpatient appropriate because: Admit - It is my clinical opinion that admission to INPATIENT is reasonable and necessary because this patient will require at least 2 midnights in the hospital to treat this condition based on the medical complexity of the problems presented.  Given the aforementioned information, the predictability of an adverse outcome is felt to be significant.         Orene Desanctis DO [facility]   If 7PM-7AM, please contact night-coverage [email or web address]   06/15/2021, 9:54 PM

## 2021-06-15 NOTE — Progress Notes (Signed)
Patient vomited a lot of subway sandwich given by wife.

## 2021-06-15 NOTE — ED Notes (Signed)
Urinal at bedside. Pt aware sample is needed.

## 2021-06-15 NOTE — ED Notes (Signed)
RN notified of abnormal labs 

## 2021-06-15 NOTE — ED Notes (Addendum)
Pt's bottoms, shirt and hoodie are drenched in urine stains. Pt has heavy urine presence upon his back, upon EMS hand-off.

## 2021-06-15 NOTE — ED Notes (Signed)
Family member cleaned and changed pt into clean clothing from home.

## 2021-06-15 NOTE — Telephone Encounter (Signed)
Since last visit he was to take 30 units of insulin, and he was told to take 10 units. He was found unconscious this morning and went to the hospital because he had low blood sugar again. Callback [phone], she would like to know recent lab results as well

## 2021-06-15 NOTE — ED Provider Notes (Signed)
[facility] DEPT Provider Note   CSN: [id number] Arrival date & time: 06/15/21  1348     History Chief Complaint  Patient presents with   Hypoglycemia    David Ford is a 80 y.o. male.  Patient with history of hypertension, insulin-dependent diabetes, stage III CKD --presents the emergency department today for another episode of hypoglycemia and altered mental status.  This has been happening over the past several weeks.  Patient's wife at bedside reports 6 episodes of calling paramedics for treatment.  Patient was seen in the emergency department on 06/04/2021 and discharged.  He followed up with his PCP on 06/08/2021 and had his insulin decreased.  Patient feels that this has helped.  Patient had eaten a meal last evening.  He then went to bed.  This morning he did not get out of bed.  Wife checked security cameras in the house and noted that nothing had been disturbed.  She went home and found him unresponsive, incontinent of urine.  EMS was called and administered D10 infusion.  This seemed to help his symptoms.  He was transported to the hospital for evaluation.  Patient denies any recent illnesses including nausea, vomiting, diarrhea.  There does not appear to be any evidence that he took his insulin this morning.  Recent A1c 7.1%.  No fevers, URI symptoms, cough.  No skin rashes or wounds.  Patient missed neurology appointment today.      Past Medical History:  Diagnosis Date   AKI (acute kidney injury) ([place]) 06/18/2015   Cancer ([place])    hx of prostate cancer   Chronic kidney disease    Diabetes mellitus without complication ([place])    DKA (diabetic ketoacidoses) 06/18/2015   Glaucoma    Hyperlipidemia    Hypertension     Patient Active Problem List   Diagnosis Date Noted   Cognitive impairment 06/08/2021   Esophagitis 01/06/2020   Dermatitis 08/20/2018   Glaucoma 01/31/2018   CKD (chronic kidney disease), stage III 08/03/2017    Hyperlipidemia associated with type 2 diabetes mellitus ([place]) 06/02/2017   BPH (benign prostatic hyperplasia) 06/02/2017   History of prostate cancer 06/02/2017   Insomnia 06/02/2017   Hypertension associated with diabetes ([place]) 06/17/2015   T2DM (type 2 diabetes mellitus) ([place]) 06/17/2015    Past Surgical History:  Procedure Laterality Date   AMPUTATION Left 07/03/2015   Procedure: Left Foot 1st and 2nd Ray Amputation;  Surgeon: Newt Minion, MD;  Location: [facility];  Service: Orthopedics;  Laterality: Left;   BASCILIC VEIN TRANSPOSITION Left 07/14/2015   Procedure: BRACHIOBASILIC VEIN TRANSPOSITION  ;  Surgeon: Elam Dutch, MD;  Location: [facility] OR;  Service: Vascular;  Laterality: Left;   IRIDOTOMY / IRIDECTOMY     prostate seeds         Family History  Problem Relation Age of Onset   Diabetes Mother    Hypertension Mother    Stroke Mother    Diabetes Sister    Glaucoma Sister    Diabetes Brother    Glaucoma Brother    Amblyopia Neg Hx    Blindness Neg Hx    Cataracts Neg Hx    Macular degeneration Neg Hx    Retinal detachment Neg Hx    Strabismus Neg Hx    Retinitis pigmentosa Neg Hx     Social History   Tobacco Use   Smoking status: Never   Smokeless tobacco: Never  Vaping Use   Vaping Use: Never used  Substance Use Topics   Alcohol use: Yes    Comment: once a week   Drug use: No    Home Medications Prior to Admission medications   Medication Sig Start Date End Date Taking? Authorizing Provider  amLODipine (NORVASC) 10 MG tablet Take 1 tablet (10 mg total) by mouth daily. 10/01/20   [name], MD  atorvastatin (LIPITOR) 10 MG tablet Take 1 tablet (10 mg total) by mouth daily. 10/01/20   [name], MD  Azelastine HCl 137 MCG/SPRAY SOLN USE 2 SPRAYS INTO BOTH NOSTRILS 2 (TWO) TIMES DAILY. 05/18/20   [name], MD  BD PEN NEEDLE NANO U/F 32G X 4 MM MISC USE WITH PEN INJECTOR DAILY Patient not taking: Reported on 06/08/2021 01/03/20   [name], MD  Blood Glucose Monitoring Suppl (TRUE METRIX METER) w/Device KIT USE AS DIRECTED 02/10/20   [name], MD  brimonidine [facility]) 0.2 % ophthalmic solution Place 1 drop into both eyes in the morning and at bedtime. Original SIG was 1 gtt ou tid  But pt states only taking 1 gtt ou bid 06/01/18   [provider]  carvedilol (COREG) 3.125 MG tablet Take 1 tablet (3.125 mg total) by mouth 2 (two) times daily with a meal. 10/01/20   [name], MD  Continuous Blood Gluc Receiver (FREESTYLE LIBRE 2 READER) DEVI USe 4 times daily to check blood dsugar 06/08/21   [name], MD  Continuous Blood Gluc Sensor (FREESTYLE LIBRE 2 SENSOR) MISC Use 4 times daily to check blood sugar. 06/08/21   [name], MD  dorzolamide-timolol (COSOPT) 22.3-6.8 MG/ML ophthalmic solution Place 1 drop into both eyes 2 (two) times daily.  04/02/19   [provider]  empagliflozin (JARDIANCE) 10 MG TABS tablet Take 1 tablet (10 mg total) by mouth daily before breakfast. 10/01/20   [name], MD  FLUAD QUADRIVALENT 0.5 ML injection  07/09/20   [provider]  fluticasone (FLONASE) 50 MCG/ACT nasal spray Place 2 sprays into both nostrils daily. 07/24/18   [name], MD  insulin glargine (LANTUS SOLOSTAR) 100 UNIT/ML Solostar Pen INJECT 30 TO 50 UNITS INTO THE SKIN DAILY. Patient not taking: Reported on 06/08/2021 03/23/21   [name], MD  Insulin Pen Needle (PEN NEEDLES) 32G X 5 MM MISC 1 application by Does not apply route 4 (four) times daily as needed (to inject insulin). 08/03/17   [name], MD  latanoprost (XALATAN) 0.005 % ophthalmic solution INSTILL 1 DROP INTO BOTH EYES AT BEDTIME Patient taking differently: Place 1 drop into both eyes at bedtime. 03/29/18   [name], MD  pantoprazole (PROTONIX) 40 MG tablet Take 1 tablet (40 mg total) by mouth 2 (two) times daily. 10/01/20   [name], MD  PFIZER-BIONTECH COVID-19 [facility] 30 MCG/0.3ML  injection  07/09/20   [provider]  tamsulosin (FLOMAX) 0.4 MG CAPS capsule Take 1 capsule (0.4 mg total) by mouth daily. 10/01/20   [name], MD  traZODone (DESYREL) 150 MG tablet TAKE 2 TABLETS AT BEDTIME 01/29/21   [name], MD  triamcinolone ointment (KENALOG) 0.5 % APPLY TOPICALLY 2 TIMES DAILY 02/15/21   [name], MD  TRUE METRIX BLOOD GLUCOSE TEST test strip TEST BLOOD SUGAR FOUR TIMES DAILY AND AS NEEDED 07/28/20   [name], MD  TRUEplus Lancets 33G MISC CHECK BLOOD SUGAR FOUR TIMES DAILY  AND AS NEEDED Patient taking differently: 1  each by Other route See admin instructions. CHECK BLOOD SUGAR FOUR TIMES DAILY  AND AS NEEDED 10/04/19   [name], MD  Vitamin D, Cholecalciferol, 1000 units CAPS Take 1,000 Units by mouth daily.    [provider]    Allergies    Patient has no known allergies.  Review of Systems   Review of Systems  Constitutional:  Negative for fever.  HENT:  Negative for rhinorrhea and sore throat.   Eyes:  Negative for redness.  Respiratory:  Negative for cough.   Cardiovascular:  Negative for chest pain.  Gastrointestinal:  Negative for abdominal pain, diarrhea, nausea and vomiting.  Genitourinary:  Negative for dysuria and hematuria.  Musculoskeletal:  Negative for myalgias.  Skin:  Negative for rash.  Neurological:  Negative for seizures, syncope, facial asymmetry, weakness and headaches.  Psychiatric/Behavioral:  Positive for confusion.    Physical Exam Updated Vital Signs BP 136/73 (BP Location: Left Arm)    Pulse 87    Temp 98.8 F (37.1 C) (Oral)    Resp 16    SpO2 96%   Physical Exam Vitals and nursing note reviewed.  Constitutional:      General: He is not in acute distress.    Appearance: He is well-developed.  HENT:     Head: Normocephalic and atraumatic.     Right Ear: External ear normal.     Left Ear: External ear normal.  Eyes:     General:        Right eye: No discharge.        Left  eye: No discharge.     Conjunctiva/sclera: Conjunctivae normal.  Cardiovascular:     Rate and Rhythm: Normal rate and regular rhythm.     Heart sounds: Normal heart sounds.  Pulmonary:     Effort: Pulmonary effort is normal.     Breath sounds: Normal breath sounds.  Abdominal:     Palpations: Abdomen is soft.     Tenderness: There is no abdominal tenderness.  Musculoskeletal:     Cervical back: Normal range of motion and neck supple.  Skin:    General: Skin is warm and dry.     Comments: Dry flaking skin on the feet without wounds.  Previous first toe amputation on the left, well-healed.  No other obvious sores or skin lesions noted.  Neurological:     Mental Status: He is alert.    ED Results / Procedures / Treatments   Labs (all labs ordered are listed, but only abnormal results are displayed) Labs Reviewed  CBC WITH DIFFERENTIAL/PLATELET - Abnormal; Notable for the following components:      Result Value   WBC 14.5 (*)    RBC 3.73 (*)    Hemoglobin 10.9 (*)    HCT 33.9 (*)    Neutro Abs 11.8 (*)    Monocytes Absolute 1.1 (*)    All other components within normal limits  COMPREHENSIVE METABOLIC PANEL - Abnormal; Notable for the following components:   Potassium 2.6 (*)    Chloride 124 (*)    CO2 15 (*)    Glucose, Bld 201 (*)    Calcium 4.9 (*)    Total Protein 3.6 (*)    Albumin 1.8 (*)    Alkaline Phosphatase 29 (*)    GFR, Estimated 59 (*)    All other components within normal limits  BASIC METABOLIC PANEL - Abnormal; Notable for the following components:   Glucose, Bld 49 (*)    BUN  33 (*)    Creatinine, Ser 2.26 (*)    GFR, Estimated 29 (*)    All other components within normal limits  CBG MONITORING, ED - Abnormal; Notable for the following components:   Glucose-Capillary 53 (*)    All other components within normal limits  RESP PANEL BY RT-PCR (FLU A&B, COVID) [id number]  URINALYSIS, ROUTINE W REFLEX MICROSCOPIC  CBG MONITORING, ED  CBG MONITORING, ED     EKG EKG Interpretation  Date/Time:  Tuesday June 15 2021 17:38:51 EST Ventricular Rate:  79 PR Interval:  182 QRS Duration: 147 QT Interval:  415 QTC Calculation: 476 R Axis:   -18 Text Interpretation: Sinus rhythm Left bundle branch block Confirmed by Lajean Saver [phone]) on 06/15/2021 5:43:32 PM  Radiology DG Chest Portable 1 View  Result Date: 06/15/2021 CLINICAL DATA:  Hypoglycemia.  Evaluate for occult infection EXAM: PORTABLE CHEST 1 VIEW COMPARISON:  06/04/2021. FINDINGS: Normal heart size. No pleural effusion or edema identified. No airspace opacities. The visualized osseous structures are unremarkable. IMPRESSION: No active disease. Electronically Signed   By: Kerby Moors M.D.   On: 06/15/2021 16:27    Procedures Procedures   Medications Ordered in ED Medications  sodium chloride 0.9 % bolus 1,000 mL (1,000 mLs Intravenous New Bag/Given 06/15/21 2012)  dextrose 50 % solution 50 mL (50 mLs Intravenous Given 06/15/21 2013)  dextrose 5 % solution ( Intravenous New Bag/Given 06/15/21 2032)    ED Course  I have reviewed the triage vital signs and the nursing notes.  Pertinent labs & imaging results that were available during my care of the patient were reviewed by me and considered in my medical decision making (see chart for details).  Patient seen and examined. Work-up initiated. Reviewed recent work-up.   Vital signs reviewed and are as follows: BP 136/73 (BP Location: Left Arm)    Pulse 87    Temp 98.8 F (37.1 C) (Oral)    Resp 16    SpO2 96%   Patient discussed with and seen by Dr. Ashok Cordia.   5:11 PM patient rechecked.  Chemistry shows values that are not in line with patient's previous.  Will redraw a BMP to confirm or refute initial values. This is in process.  Patient rechecked.  Appears comfortable.  States that he feels reasonably well.  8:16 PM repeat BMP was delayed.  I spoke with the lab regarding results.  First BMP appears to be  inaccurate.  Of note, creatinine is 2.26 slightly worse than the patient's baseline and blood sugar has once again worsened to 49.  On repeat CBG blood sugar 53.  Patient more confused per wife at bedside, but awake.  D50 ordered.  Will start D5 drip and request hospitalist admission for persistent hypoglycemia in setting of multiple recent episodes of same.  Likely related to decreased oral intake.  On recheck, abdomen remains soft and nontender.  COVID testing ordered.  8:47 PM Discussed patient with Dr. Flossie Buffy, [facility] hospitalist, who will see patient.  UA still pending.   CRITICAL CARE Performed by: Carlisle Cater PA-C Total critical care time: 40 minutes Critical care time was exclusive of separately billable procedures and treating other patients. Critical care was necessary to treat or prevent imminent or life-threatening deterioration. Critical care was time spent personally by me on the following activities: development of treatment plan with patient and/or surrogate as well as nursing, discussions with consultants, evaluation of patient's response to treatment, examination of patient, obtaining history from patient or surrogate,  ordering and performing treatments and interventions, ordering and review of laboratory studies, ordering and review of radiographic studies, pulse oximetry and re-evaluation of patient's condition.      MDM Rules/Calculators/A&P                         Admit      Final Clinical Impression(s) / ED Diagnoses Final diagnoses:  Hypoglycemia  Acute renal failure superimposed on chronic kidney disease, unspecified CKD stage, unspecified acute renal failure type (HCC)  Weakness  Nausea and vomiting, unspecified vomiting type    Rx / DC Orders ED Discharge Orders     None        Carlisle Cater, PA-C 06/15/21 2048    Lajean Saver, MD 06/18/21 1900

## 2021-06-15 NOTE — ED Provider Notes (Signed)
Emergency Medicine Provider Triage Evaluation Note  David Ford , a 80 y.o. male  was evaluated in triage.  Pt complains of no complaints at this time. Was brought by EMS for hypoglycemia - was given D10 infusion by EMS w improvement of BS. Pt states he feels well currently. Did urinate on himself.  Denies any pain or urinary sx.   Review of Systems  Positive: Hypoglycemia Negative: Fever, pain  Physical Exam  BP 136/73 (BP Location: Left Arm)    Pulse 87    Temp 98.8 F (37.1 C) (Oral)    Resp 16    SpO2 96%  Gen:   Awake, no distress   Resp:  Normal effort  MSK:   Moves extremities without difficulty  Other:  Urine soaked pants. Mentating well. Aox3. Moves all extremities. Abd soft.   Medical Decision Making  Medically screening exam initiated at 2:21 PM.  Appropriate orders placed.  David Ford was informed that the remainder of the evaluation will be completed by another provider, this initial triage assessment does not replace that evaluation, and the importance of remaining in the ED until their evaluation is complete.  Urine, labs ordered. No complaints at this time.    Pati Gallo [place], [place] 06/15/21 1423    Sherwood Gambler, MD 06/15/21 [phone]

## 2021-06-15 NOTE — Telephone Encounter (Signed)
Spoke w/ legal guardian pt dob verified; see lab notes

## 2021-06-15 NOTE — ED Triage Notes (Signed)
Pt presents with c/o hypoglycemia. EMS was called as pt's blood sugar was low. When EMS arrived, pt's CBG was 45, given a bag of D10. Blood sugar is now 143 per EMS. EMS reports that they have been called multiple times for the same. Family wanted pt to be evaluated. Pt is alert and oriented.

## 2021-06-16 ENCOUNTER — Other Ambulatory Visit: Payer: Self-pay

## 2021-06-16 ENCOUNTER — Inpatient Hospital Stay (HOSPITAL_COMMUNITY): Payer: Medicare HMO

## 2021-06-16 DIAGNOSIS — N189 Chronic kidney disease, unspecified: Secondary | ICD-10-CM

## 2021-06-16 DIAGNOSIS — D649 Anemia, unspecified: Secondary | ICD-10-CM

## 2021-06-16 DIAGNOSIS — E162 Hypoglycemia, unspecified: Secondary | ICD-10-CM | POA: Diagnosis not present

## 2021-06-16 LAB — IRON AND TIBC
Iron: 21 ug/dL — ABNORMAL LOW (ref 45–182)
Saturation Ratios: 11 % — ABNORMAL LOW (ref 17.9–39.5)
TIBC: 188 ug/dL — ABNORMAL LOW (ref 250–450)
UIBC: 167 ug/dL

## 2021-06-16 LAB — URINALYSIS, ROUTINE W REFLEX MICROSCOPIC
Bacteria, UA: NONE SEEN
Bilirubin Urine: NEGATIVE
Glucose, UA: NEGATIVE mg/dL
Ketones, ur: NEGATIVE mg/dL
Leukocytes,Ua: NEGATIVE
Nitrite: NEGATIVE
Protein, ur: >=300 mg/dL — AB
Specific Gravity, Urine: 1.02 (ref 1.005–1.030)
pH: 5 (ref 5.0–8.0)

## 2021-06-16 LAB — BASIC METABOLIC PANEL
Anion gap: 8 (ref 5–15)
BUN: 33 mg/dL — ABNORMAL HIGH (ref 8–23)
CO2: 24 mmol/L (ref 22–32)
Calcium: 8.5 mg/dL — ABNORMAL LOW (ref 8.9–10.3)
Chloride: 108 mmol/L (ref 98–111)
Creatinine, Ser: 2.13 mg/dL — ABNORMAL HIGH (ref 0.61–1.24)
GFR, Estimated: 31 mL/min — ABNORMAL LOW (ref >60)
Glucose, Bld: 144 mg/dL — ABNORMAL HIGH (ref 70–99)
Potassium: 3.8 mmol/L (ref 3.5–5.1)
Sodium: 140 mmol/L (ref 135–145)

## 2021-06-16 LAB — CBC
HCT: 35.3 % — ABNORMAL LOW (ref 39.0–52.0)
Hemoglobin: 11.8 g/dL — ABNORMAL LOW (ref 13.0–17.0)
MCH: 29.6 pg (ref 26.0–34.0)
MCHC: 33.4 g/dL (ref 30.0–36.0)
MCV: 88.5 fL (ref 80.0–100.0)
Platelets: 223 10*3/uL (ref 150–400)
RBC: 3.99 MIL/uL — ABNORMAL LOW (ref 4.22–5.81)
RDW: 14.4 % (ref 11.5–15.5)
WBC: 13 10*3/uL — ABNORMAL HIGH (ref 4.0–10.5)
nRBC: 0 % (ref 0.0–0.2)

## 2021-06-16 LAB — GLUCOSE, CAPILLARY
Glucose-Capillary: 116 mg/dL — ABNORMAL HIGH (ref 70–99)
Glucose-Capillary: 141 mg/dL — ABNORMAL HIGH (ref 70–99)
Glucose-Capillary: 152 mg/dL — ABNORMAL HIGH (ref 70–99)
Glucose-Capillary: 174 mg/dL — ABNORMAL HIGH (ref 70–99)
Glucose-Capillary: 291 mg/dL — ABNORMAL HIGH (ref 70–99)
Glucose-Capillary: 364 mg/dL — ABNORMAL HIGH (ref 70–99)
Glucose-Capillary: 402 mg/dL — ABNORMAL HIGH (ref 70–99)
Glucose-Capillary: 98 mg/dL (ref 70–99)

## 2021-06-16 LAB — FOLATE: Folate: 10 ng/mL (ref >5.9)

## 2021-06-16 LAB — OCCULT BLOOD X 1 CARD TO LAB, STOOL: Fecal Occult Bld: NEGATIVE

## 2021-06-16 MED ORDER — ATORVASTATIN CALCIUM 10 MG PO TABS
10.0000 mg | ORAL_TABLET | Freq: Every day | ORAL | Status: DC
  Administered 2021-06-16 – 2021-06-18 (×3): 10 mg via ORAL
  Filled 2021-06-16 (×3): qty 1

## 2021-06-16 MED ORDER — BRIMONIDINE TARTRATE 0.2 % OP SOLN
1.0000 [drp] | Freq: Two times a day (BID) | OPHTHALMIC | Status: DC
  Administered 2021-06-16 – 2021-06-18 (×6): 1 [drp] via OPHTHALMIC
  Filled 2021-06-16: qty 5

## 2021-06-16 MED ORDER — TRAZODONE HCL 50 MG PO TABS
150.0000 mg | ORAL_TABLET | Freq: Every evening | ORAL | Status: DC | PRN
  Administered 2021-06-17: 21:00:00 150 mg via ORAL
  Filled 2021-06-16: qty 1

## 2021-06-16 MED ORDER — TAMSULOSIN HCL 0.4 MG PO CAPS
0.4000 mg | ORAL_CAPSULE | Freq: Every day | ORAL | Status: DC
  Administered 2021-06-16 – 2021-06-18 (×3): 0.4 mg via ORAL
  Filled 2021-06-16 (×3): qty 1

## 2021-06-16 MED ORDER — ADULT MULTIVITAMIN W/MINERALS CH
1.0000 | ORAL_TABLET | Freq: Every day | ORAL | Status: DC
Start: 2021-06-17 — End: 2021-06-18
  Administered 2021-06-17 – 2021-06-18 (×2): 1 via ORAL
  Filled 2021-06-16 (×2): qty 1

## 2021-06-16 MED ORDER — VITAMIN D 25 MCG (1000 UNIT) PO TABS
1000.0000 [IU] | ORAL_TABLET | Freq: Every day | ORAL | Status: DC
  Administered 2021-06-16 – 2021-06-18 (×3): 1000 [IU] via ORAL
  Filled 2021-06-16 (×3): qty 1

## 2021-06-16 MED ORDER — PANTOPRAZOLE SODIUM 40 MG IV SOLR
40.0000 mg | Freq: Two times a day (BID) | INTRAVENOUS | Status: DC
  Administered 2021-06-16 – 2021-06-17 (×2): 40 mg via INTRAVENOUS
  Filled 2021-06-16 (×2): qty 40

## 2021-06-16 MED ORDER — PANTOPRAZOLE SODIUM 40 MG PO TBEC
40.0000 mg | DELAYED_RELEASE_TABLET | Freq: Every day | ORAL | Status: DC
  Administered 2021-06-16: 09:00:00 40 mg via ORAL
  Filled 2021-06-16: qty 1

## 2021-06-16 MED ORDER — AMLODIPINE BESYLATE 10 MG PO TABS
10.0000 mg | ORAL_TABLET | Freq: Every day | ORAL | Status: DC
  Administered 2021-06-16 – 2021-06-18 (×3): 10 mg via ORAL
  Filled 2021-06-16 (×3): qty 1

## 2021-06-16 MED ORDER — DORZOLAMIDE HCL-TIMOLOL MAL 2-0.5 % OP SOLN
1.0000 [drp] | Freq: Two times a day (BID) | OPHTHALMIC | Status: DC
  Administered 2021-06-16 – 2021-06-18 (×6): 1 [drp] via OPHTHALMIC
  Filled 2021-06-16: qty 10

## 2021-06-16 MED ORDER — LATANOPROST 0.005 % OP SOLN
1.0000 [drp] | Freq: Every day | OPHTHALMIC | Status: DC
  Administered 2021-06-16 – 2021-06-17 (×2): 1 [drp] via OPHTHALMIC
  Filled 2021-06-16: qty 2.5

## 2021-06-16 MED ORDER — PROCHLORPERAZINE EDISYLATE 10 MG/2ML IJ SOLN
10.0000 mg | Freq: Four times a day (QID) | INTRAMUSCULAR | Status: DC | PRN
  Administered 2021-06-16: 16:00:00 10 mg via INTRAVENOUS
  Filled 2021-06-16: qty 2

## 2021-06-16 MED ORDER — BOOST / RESOURCE BREEZE PO LIQD CUSTOM
1.0000 | Freq: Three times a day (TID) | ORAL | Status: DC
  Administered 2021-06-16 – 2021-06-17 (×3): 1 via ORAL

## 2021-06-16 MED ORDER — CARVEDILOL 3.125 MG PO TABS
3.1250 mg | ORAL_TABLET | Freq: Two times a day (BID) | ORAL | Status: DC
  Administered 2021-06-16 – 2021-06-18 (×5): 3.125 mg via ORAL
  Filled 2021-06-16 (×5): qty 1

## 2021-06-16 NOTE — Evaluation (Signed)
Physical Therapy Evaluation Patient Details Name: David Ford MRN: [id number] DOB: 1940-12-02 Today's Date: 06/16/2021  History of Present Illness  80 yo male admitted with hypoglycemia, AMS, weaknss. Hx of DM, Afib, glaucoma, CKD, s/p L 1st and 2nd toe amputation, prostate ca, CVA, DKA  Clinical Impression  On eval, pt required Min A to stand and Min A to ambulate ~40 feet with a RW. Pt presents with general weakness, decreased activity tolerance, and impaired gait and balance. No family present during session. Will plan to follow and progress activity as tolerated. Recommend HHPT and 24/7 supervision/assist.        Recommendations for follow up therapy are one component of a multi-disciplinary discharge planning process, led by the attending physician.  Recommendations may be updated based on patient status, additional functional criteria and insurance authorization.  Follow Up Recommendations Home health PT    Assistance Recommended at Discharge Frequent or constant Supervision/Assistance  Functional Status Assessment Patient has had a recent decline in their functional status and demonstrates the ability to make significant improvements in function in a reasonable and predictable amount of time.  Equipment Recommendations  Rolling walker (2 wheels) (depending on progress)    Recommendations for Other Services       Precautions / Restrictions Precautions Precautions: Fall Restrictions Weight Bearing Restrictions: No      Mobility  Bed Mobility               General bed mobility comments: oob in recliner    Transfers Overall transfer level: Needs assistance Equipment used: Rolling walker (2 wheels) Transfers: Sit to/from Stand Sit to Stand: Min assist           General transfer comment: Pt attempted multiple times to stand from recliner-unable. Brought recliner up to use and provided assist to power up. Cues for hand placement.     Ambulation/Gait Ambulation/Gait assistance: Min assist Gait Distance (Feet): 40 Feet Assistive device: Rolling walker (2 wheels) Gait Pattern/deviations: Step-through pattern;Decreased stride length       General Gait Details: Min A to steady pt and manage RW. Walked from room to transport chair near front desk (going down for CT)  Financial trader [employer] (Stroke Patients Only)       Balance Overall balance assessment: Needs assistance         Standing balance support: Bilateral upper extremity supported;Reliant on assistive device for balance Standing balance-Leahy Scale: Poor                               Pertinent Vitals/Pain Pain Assessment: No/denies pain    Home Living Family/patient expects to be discharged to:: Private residence Living Arrangements: Spouse/significant other Available Help at Discharge: Family                    Prior Function               Mobility Comments: per chart, pt was ambulatory without a device-pt reports this as well       Hand Dominance        Extremity/Trunk Assessment   Upper Extremity Assessment Upper Extremity Assessment: Defer to OT evaluation    Lower Extremity Assessment Lower Extremity Assessment: Generalized weakness    Cervical / Trunk Assessment Cervical / Trunk Assessment: Normal  Communication      Cognition Arousal/Alertness: Awake/alert  Behavior During Therapy: [facility] for tasks assessed/performed                                   General Comments: soft spoken        General Comments      Exercises     Assessment/Plan    PT Assessment Patient needs continued PT services  PT Problem List Decreased strength;Decreased mobility;Decreased activity tolerance;Decreased balance;Decreased knowledge of use of DME       PT Treatment Interventions DME instruction;Gait training;Therapeutic activities;Therapeutic  exercise;Patient/family education;Balance training;Functional mobility training    PT Goals (Current goals can be found in the Care Plan section)  Acute Rehab PT Goals Patient Stated Goal: none stated PT Goal Formulation: With patient Time For Goal Achievement: 06/30/21 Potential to Achieve Goals: Good    Frequency Min 3X/week   Barriers to discharge        Co-evaluation               AM-PAC PT "6 Clicks" Mobility  Outcome Measure Help needed turning from your back to your side while in a flat bed without using bedrails?: A Little Help needed moving from lying on your back to sitting on the side of a flat bed without using bedrails?: A Little Help needed moving to and from a bed to a chair (including a wheelchair)?: A Little Help needed standing up from a chair using your arms (e.g., wheelchair or bedside chair)?: A Little Help needed to walk in hospital room?: A Little Help needed climbing 3-5 steps with a railing? : A Lot 6 Click Score: 17    End of Session Equipment Utilized During Treatment: Gait belt Activity Tolerance: Patient tolerated treatment well Patient left: in chair;with call bell/phone within reach   PT Visit Diagnosis: Muscle weakness (generalized) (M62.81);Difficulty in walking, not elsewhere classified (R26.2);Unsteadiness on feet (R26.81)    Time: 7943-2761 PT Time Calculation (min) (ACUTE ONLY): 22 min   Charges:   PT Evaluation $PT Eval Moderate Complexity: 1 Mod            Doreatha Massed, PT Acute Rehabilitation  Office: [phone] Pager: [phone]

## 2021-06-16 NOTE — Progress Notes (Signed)
Pt  had a projectile vomiting with brown colored liquids with some eggs from breakfast. Vomitus all over the bed and some vomitus found by the bathroom door. Pt stated it happened suddenly without warning. Pt denied any abd pain. MD notified.

## 2021-06-16 NOTE — Progress Notes (Addendum)
PROGRESS NOTE  David Ford MBE:[id number] DOB: 1941/06/03 DOA: 06/15/2021 PCP: [name], MD  HPI/Recap of past 24 hours: David Ford is a 80 y.o. male with medical history significant for insulin-dependent type 2 diabetes, hypertension, hyperlipidemia, CKD stage III, history of prostate cancer and cognitive impairment who presents with concerns of hypoglycemia.   Wife at bedside provides history.  She says that in the past 2 weeks they have called EMS to the house 6 times due to hypoglycemia.  He used to take 30 units of Lantus daily but last saw his PCP on 12/20 and had cut down to 10 units.  He did not take his dose this morning.  However wife noted today that he was more confused, weak and had an episode of incontinence laying in bed which is unusual for him.   On EMS arrival, he was noted to be hypoglycemic with BG of 45 with improvement up to 147 after D10.  He again had recurrent hypoglycemia in the ED down to 53 so hospitalist was called for further management.   ED Course: He was afebrile, normotensive on room air.  WBC of 14.5, hemoglobin of 10.9 down from 13-week ago. Sodium of 142, K of 4, creatinine of 2.26 from a prior of 1.75.   UA is negative for UTI but had greater than 1.03 specific gravity and 100+ protein.  06/16/2021: Patient was seen and examined at bedside.  He is more alert and interactive.  Blood sugar more stable.  Continue to monitor CBGs.  Assessment/Plan: Principal Problem:   Hypoglycemia Active Problems:   Hypertension associated with diabetes ([place])   Hyperlipidemia associated with type 2 diabetes mellitus ([place])   Acute-on-chronic kidney injury ([place])   Anemia  Hypoglycemia, unclear etiology Hemoglobin A1c of 7.1 on 06/08/2021. Possible overdose of insulin. Prior to admission on Lantus 10 units and 10 mg of Jard[place] at home Insulin held and CBG stable. Continue to monitor off dextrose  History of esophagitis with projectile vomiting CT  abdomen pelvis without contrast Started IV Protonix 40 mg twice daily IV antiemetics as needed, Compazine Monitor H&H and follow CT scan If CT scan abnormal will consult GI if indicated   Acute on CKD stage 3 -creatinine of 2.26 up from 1.75 Creatinine is downtrending Off IV fluid. Continue to avoid nephrotoxic agents, dehydration and hypotension.   Acute anemia-normocytic  Hgb of 10 down from 13 No overt bleeding Continue to monitor H&H   HTN BP stable continue home Norvasc and Coreg   HLD Continue statin   DVT prophylaxis:.Lovenox subcu daily Code Status: Full Family Communication: None at bedside.   Disposition Plan: Possible discharge to home on 06/17/2021. Consults called: None. Admission status: inpatient     Level of care: Progressive    Patient requires at least 2 midnights for further evaluation and treatment of present condition.     Objective: Vitals:   06/15/21 2235 06/16/21 0140 06/16/21 0656 06/16/21 1323  BP: (!) 158/72 (!) 173/73 (!) 157/68 (!) 172/78  Pulse: 75 69 69 65  Resp: 20 20 20 17   Temp: 98.4 F (36.9 C) 99.2 F (37.3 C) 99 F (37.2 C) 99.4 F (37.4 C)  TempSrc: Oral Oral Oral Oral  SpO2: 98% 97% 99% 96%  Weight: 85.7 kg     Height: 6\' 1"  (1.854 m)       Intake/Output Summary (Last 24 hours) at 06/16/2021 1510 Last data filed at 06/16/2021 0600 Gross per 24 hour  Intake 1759.53 ml  Output 414 ml  Net 1345.53 ml   Filed Weights   06/15/21 2235  Weight: 85.7 kg    Exam:  General: 80 y.o. year-old male well developed well nourished in no acute distress.  Alert and interactive. Cardiovascular: Regular rate and rhythm with no rubs or gallops.  No thyromegaly or JVD noted.   Respiratory: Clear to auscultation with no wheezes or rales. Good inspiratory effort. Abdomen: Soft nontender nondistended with normal bowel sounds x4 quadrants. Musculoskeletal: No lower extremity edema. 2/4 pulses in all 4 extremities. Skin: No  ulcerative lesions noted or rashes, Psychiatry: Mood is appropriate for condition and setting   Data Reviewed: CBC: Recent Labs  Lab 06/15/21 1614 06/16/21 0339  WBC 14.5* 13.0*  NEUTROABS 11.8*  --   HGB 10.9* 11.8*  HCT 33.9* 35.3*  MCV 90.9 88.5  PLT 235 882   Basic Metabolic Panel: Recent Labs  Lab 06/15/21 1614 06/15/21 1719 06/16/21 0339  NA 144 142 140  K 2.6* 4.0 3.8  CL 124* 106 108  CO2 15* 26 24  GLUCOSE 201* 49* 144*  BUN 19 33* 33*  CREATININE 1.24 2.26* 2.13*  CALCIUM 4.9* 9.3 8.5*   GFR: Estimated Creatinine Clearance: 31.8 mL/min (A) (by C-G formula based on SCr of 2.13 mg/dL (H)). Liver Function Tests: Recent Labs  Lab 06/15/21 1614  AST 18  ALT 7  ALKPHOS 29*  BILITOT 0.6  PROT 3.6*  ALBUMIN 1.8*   No results for input(s): LIPASE, AMYLASE in the last 168 hours. No results for input(s): AMMONIA in the last 168 hours. Coagulation Profile: No results for input(s): INR, PROTIME in the last 168 hours. Cardiac Enzymes: No results for input(s): CKTOTAL, CKMB, CKMBINDEX, TROPONINI in the last 168 hours. BNP (last 3 results) No results for input(s): PROBNP in the last 8760 hours. HbA1C: No results for input(s): HGBA1C in the last 72 hours. CBG: Recent Labs  Lab 06/16/21 0031 06/16/21 0231 06/16/21 0453 06/16/21 0720 06/16/21 1116  GLUCAP 98 116* 141* 152* 174*   Lipid Profile: No results for input(s): CHOL, HDL, LDLCALC, TRIG, CHOLHDL, LDLDIRECT in the last 72 hours. Thyroid Function Tests: No results for input(s): TSH, T4TOTAL, FREET4, T3FREE, THYROIDAB in the last 72 hours. Anemia Panel: Recent Labs    06/16/21 0339  FOLATE 10.0  TIBC 188*  IRON 21*   Urine analysis:    Component Value Date/Time   COLORURINE YELLOW 06/15/2021 1424   APPEARANCEUR CLEAR 06/15/2021 1424   LABSPEC 1.020 06/15/2021 1424   PHURINE 5.0 06/15/2021 1424   GLUCOSEU NEGATIVE 06/15/2021 1424   HGBUR MODERATE (A) 06/15/2021 1424   BILIRUBINUR  NEGATIVE 06/15/2021 1424   KETONESUR NEGATIVE 06/15/2021 1424   PROTEINUR >=300 (A) 06/15/2021 1424   NITRITE NEGATIVE 06/15/2021 1424   LEUKOCYTESUR NEGATIVE 06/15/2021 1424   Sepsis Labs: @LABRCNTIP (procalcitonin:4,lacticidven:4)  ) Recent Results (from the past 240 hour(s))  Resp Panel by RT-PCR (Flu A&B, Covid) Nasopharyngeal Swab     Status: None   Collection Time: 06/15/21  9:03 PM   Specimen: Nasopharyngeal Swab; Nasopharyngeal(NP) swabs in vial transport medium  Result Value Ref Range Status   SARS Coronavirus 2 by RT PCR NEGATIVE NEGATIVE Final    Comment: (NOTE) SARS-CoV-2 target nucleic acids are NOT DETECTED.  The SARS-CoV-2 RNA is generally detectable in upper respiratory specimens during the acute phase of infection. The lowest concentration of SARS-CoV-2 viral copies this assay can detect is 138 copies/mL. A negative result does not preclude SARS-Cov-2 infection and should not be  used as the sole basis for treatment or other patient management decisions. A negative result may occur with  improper specimen collection/handling, submission of specimen other than nasopharyngeal swab, presence of viral mutation(s) within the areas targeted by this assay, and inadequate number of viral copies(<138 copies/mL). A negative result must be combined with clinical observations, patient history, and epidemiological information. The expected result is Negative.  Fact Sheet for Patients:  EntrepreneurPulse.com.au  Fact Sheet for Healthcare Providers:  IncredibleEmployment.be  This test is no t yet approved or cleared by the [place] FDA and  has been authorized for detection and/or diagnosis of SARS-CoV-2 by FDA under an Emergency Use Authorization (EUA). This EUA will remain  in effect (meaning this test can be used) for the duration of the COVID-19 declaration under Section 564(b)(1) of the Act, 21 U.S.C.section 360bbb-3(b)(1),  unless the authorization is terminated  or revoked sooner.       Influenza A by PCR NEGATIVE NEGATIVE Final   Influenza B by PCR NEGATIVE NEGATIVE Final    Comment: (NOTE) The Xpert Xpress SARS-CoV-2/FLU/RSV plus assay is intended as an aid in the diagnosis of influenza from Nasopharyngeal swab specimens and should not be used as a sole basis for treatment. Nasal washings and aspirates are unacceptable for Xpert Xpress SARS-CoV-2/FLU/RSV testing.  Fact Sheet for Patients: EntrepreneurPulse.com.au  Fact Sheet for Healthcare Providers: IncredibleEmployment.be  This test is not yet approved or cleared by the [place] FDA and has been authorized for detection and/or diagnosis of SARS-CoV-2 by FDA under an Emergency Use Authorization (EUA). This EUA will remain in effect (meaning this test can be used) for the duration of the COVID-19 declaration under Section 564(b)(1) of the Act, 21 U.S.C. section 360bbb-3(b)(1), unless the authorization is terminated or revoked.  Performed at [facility], [place] [address] [place], [place] [address]       Studies: DG Chest Portable 1 View  Result Date: 06/15/2021 CLINICAL DATA:  Hypoglycemia.  Evaluate for occult infection EXAM: PORTABLE CHEST 1 VIEW COMPARISON:  06/04/2021. FINDINGS: Normal heart size. No pleural effusion or edema identified. No airspace opacities. The visualized osseous structures are unremarkable. IMPRESSION: No active disease. Electronically Signed   By: Kerby Moors M.D.   On: 06/15/2021 16:27    Scheduled Meds:  amLODipine  10 mg Oral Daily   atorvastatin  10 mg Oral Daily   brimonidine  1 drop Both Eyes BID   carvedilol  3.125 mg Oral BID WC   cholecalciferol  1,000 Units Oral Daily   dorzolamide-timolol  1 drop Both Eyes BID   enoxaparin (LOVENOX) injection  30 mg Subcutaneous Q24H   feeding supplement  1 Container Oral TID BM   latanoprost  1 drop Both  Eyes QHS   [START ON 06/17/2021] multivitamin with minerals  1 tablet Oral Daily   pantoprazole  40 mg Oral Daily   tamsulosin  0.4 mg Oral Daily    Continuous Infusions:   LOS: 1 day     Kayleen Memos, MD [facility] Pager [phone]  If 7PM-7AM, please contact night-coverage [email or web address] Password [email or web address] 06/16/2021, 3:10 PM

## 2021-06-16 NOTE — Progress Notes (Signed)
Initial Nutrition Assessment  INTERVENTION:   -Boost Breeze po TID, each supplement provides 250 kcal and 9 grams of protein  -Multivitamin with minerals daily  NUTRITION DIAGNOSIS:   Inadequate oral intake related to nausea, vomiting as evidenced by per patient/family report.  GOAL:   Patient will meet greater than or equal to 90% of their needs  MONITOR:   PO intake, Supplement acceptance, Labs, Weight trends, I & O's  REASON FOR ASSESSMENT:   Malnutrition Screening Tool    ASSESSMENT:   80 y.o. male with medical history significant for insulin-dependent type 2 diabetes, hypertension, hyperlipidemia, CKD stage III, history of prostate cancer and cognitive impairment who presents with concerns of hypoglycemia.  Patient in room, no family at bedside at time of visit. Pt reports not feeling well, still having nausea and was vomiting this morning. Has crackers and ginger ale at bedside. Has not eaten anything else. Given poor PO, pt having hypoglycemic events. Will order Boost Breeze.  Per weight records, pt has lost 10 lbs since 4/14 (5% wt loss x 8.5 months, insignificant for time frame).  Medications: Vitamin D  Labs reviewed:  CBGs: 98-174  NUTRITION - FOCUSED PHYSICAL EXAM:  Pt declined, feeling too nauseous.  Diet Order:   Diet Order             Diet regular Room service appropriate? Yes; Fluid consistency: Thin  Diet effective now                   EDUCATION NEEDS:   No education needs have been identified at this time  Skin:  Skin Assessment: Reviewed RN Assessment  Last BM:  PTA  Height:   Ht Readings from Last 1 Encounters:  06/15/21 6\' 1"  (1.854 m)    Weight:   Wt Readings from Last 1 Encounters:  06/15/21 85.7 kg    BMI:  Body mass index is 24.92 kg/m.  Estimated Nutritional Needs:   Kcal:  2100-2300  Protein:  100-110g  Fluid:  2L/day  Clayton Bibles, MS, RD, LDN Inpatient Clinical Dietitian Contact information  available via Amion

## 2021-06-17 DIAGNOSIS — E162 Hypoglycemia, unspecified: Secondary | ICD-10-CM | POA: Diagnosis not present

## 2021-06-17 LAB — COMPREHENSIVE METABOLIC PANEL
ALT: 14 U/L (ref 0–44)
AST: 23 U/L (ref 15–41)
Albumin: 2.9 g/dL — ABNORMAL LOW (ref 3.5–5.0)
Alkaline Phosphatase: 49 U/L (ref 38–126)
Anion gap: 9 (ref 5–15)
BUN: 32 mg/dL — ABNORMAL HIGH (ref 8–23)
CO2: 22 mmol/L (ref 22–32)
Calcium: 8.5 mg/dL — ABNORMAL LOW (ref 8.9–10.3)
Chloride: 105 mmol/L (ref 98–111)
Creatinine, Ser: 1.8 mg/dL — ABNORMAL HIGH (ref 0.61–1.24)
GFR, Estimated: 38 mL/min — ABNORMAL LOW (ref >60)
Glucose, Bld: 340 mg/dL — ABNORMAL HIGH (ref 70–99)
Potassium: 4.1 mmol/L (ref 3.5–5.1)
Sodium: 136 mmol/L (ref 135–145)
Total Bilirubin: 0.9 mg/dL (ref 0.3–1.2)
Total Protein: 5.6 g/dL — ABNORMAL LOW (ref 6.5–8.1)

## 2021-06-17 LAB — CBC
HCT: 33.7 % — ABNORMAL LOW (ref 39.0–52.0)
Hemoglobin: 11.3 g/dL — ABNORMAL LOW (ref 13.0–17.0)
MCH: 29.4 pg (ref 26.0–34.0)
MCHC: 33.5 g/dL (ref 30.0–36.0)
MCV: 87.5 fL (ref 80.0–100.0)
Platelets: 240 10*3/uL (ref 150–400)
RBC: 3.85 MIL/uL — ABNORMAL LOW (ref 4.22–5.81)
RDW: 13.9 % (ref 11.5–15.5)
WBC: 8.6 10*3/uL (ref 4.0–10.5)
nRBC: 0 % (ref 0.0–0.2)

## 2021-06-17 LAB — GLUCOSE, CAPILLARY
Glucose-Capillary: 362 mg/dL — ABNORMAL HIGH (ref 70–99)
Glucose-Capillary: 319 mg/dL — ABNORMAL HIGH (ref 70–99)
Glucose-Capillary: 192 mg/dL — ABNORMAL HIGH (ref 70–99)
Glucose-Capillary: 194 mg/dL — ABNORMAL HIGH (ref 70–99)
Glucose-Capillary: 175 mg/dL — ABNORMAL HIGH (ref 70–99)

## 2021-06-17 LAB — MAGNESIUM: Magnesium: 1.8 mg/dL (ref 1.7–2.4)

## 2021-06-17 LAB — PHOSPHORUS: Phosphorus: 2.6 mg/dL (ref 2.5–4.6)

## 2021-06-17 MED ORDER — ENOXAPARIN SODIUM 40 MG/0.4ML IJ SOSY
40.0000 mg | PREFILLED_SYRINGE | INTRAMUSCULAR | Status: DC
  Administered 2021-06-18: 08:00:00 40 mg via SUBCUTANEOUS
  Filled 2021-06-17: qty 0.4

## 2021-06-17 MED ORDER — INSULIN ASPART 100 UNIT/ML IJ SOLN: 0.0000 [IU] | Freq: Every day | INTRAMUSCULAR | Status: DC

## 2021-06-17 MED ORDER — INSULIN ASPART 100 UNIT/ML IJ SOLN
0.0000 [IU] | Freq: Three times a day (TID) | INTRAMUSCULAR | Status: DC
  Administered 2021-06-17 (×2): 2 [IU] via SUBCUTANEOUS

## 2021-06-17 MED ORDER — INSULIN ASPART 100 UNIT/ML IJ SOLN
0.0000 [IU] | INTRAMUSCULAR | Status: DC
  Administered 2021-06-17: 09:00:00 7 [IU] via SUBCUTANEOUS

## 2021-06-17 MED ORDER — INSULIN GLARGINE-YFGN 100 UNIT/ML ~~LOC~~ SOLN
5.0000 [IU] | Freq: Every day | SUBCUTANEOUS | Status: DC
  Administered 2021-06-17: 12:00:00 5 [IU] via SUBCUTANEOUS
  Filled 2021-06-17: qty 0.05

## 2021-06-17 MED ORDER — PANTOPRAZOLE SODIUM 40 MG PO TBEC
40.0000 mg | DELAYED_RELEASE_TABLET | Freq: Two times a day (BID) | ORAL | Status: DC
  Administered 2021-06-17 – 2021-06-18 (×2): 40 mg via ORAL
  Filled 2021-06-17 (×2): qty 1

## 2021-06-17 NOTE — Progress Notes (Signed)
PROGRESS NOTE  David Ford CLE:[id number] DOB: 08-11-1940 DOA: 06/15/2021 PCP: [name], MD  HPI/Recap of past 24 hours: David Ford is a 80 y.o. male with medical history significant for insulin-dependent type 2 diabetes, hypertension, hyperlipidemia, CKD stage III, history of prostate cancer and cognitive impairment who presents with concerns of hypoglycemia.   Wife at bedside provides history.  She says that in the past 2 weeks they have called EMS to the house 6 times due to hypoglycemia.  He used to take 30 units of Lantus daily but last saw his PCP on 12/20 and had cut down to 10 units.  He did not take his dose this morning.  However wife noted today that he was more confused, weak and had an episode of incontinence laying in bed which is unusual for him.   On EMS arrival, he was noted to be hypoglycemic with BG of 45 with improvement up to 147 after D10.  He again had recurrent hypoglycemia in the ED down to 53 so hospitalist was called for further management.   ED Course: He was afebrile, normotensive on room air.  WBC of 14.5, hemoglobin of 10.9 down from 13-week ago. Sodium of 142, K of 4, creatinine of 2.26 from a prior of 1.75.   UA is negative for UTI but had greater than 1.03 specific gravity and 100+ protein.  06/17/2021: Patient was seen and examined at his bedside.  He is more alert and interactive today.  He denies having any nausea or abdominal pain.  Tolerating a diet.  Had projectile vomiting on 06/16/2021.  Assessment/Plan: Principal Problem:   Hypoglycemia Active Problems:   Hypertension associated with diabetes ([place])   Hyperlipidemia associated with type 2 diabetes mellitus ([place])   Acute-on-chronic kidney injury ([place])   Anemia  Resolved hypoglycemia, likely secondary to overuse of insulin.  Now hyperglycemic. Hemoglobin A1c of 7.1 on 06/08/2021. Hyperglycemic, added Semglee 5 units daily And insulin sliding scale. Tolerating a diet well  History  of esophagitis  Projectile vomiting reported on 06/16/2021. CT abdomen pelvis without contrast nonacute. Continue PPI. IV antiemetics as needed.   AKI on CKD 3B. -creatinine of 2.26 up from 1.75 Creatinine is downtrending close to baseline Monitor urine output   Acute anemia-normocytic  Hgb of 10 down from 13 No overt bleeding Continue to monitor H&H   HTN BP still at goal, elevated. Continue home Norvasc and Coreg Continue to monitor vital signs.   HLD Continue statin   DVT prophylaxis:.Lovenox subcu daily Code Status: Full Family Communication: None at bedside.   Disposition Plan: Possible discharge to home on 06/18/2021 if no acute events overnight. Consults called: None. Admission status: inpatient     Level of care: Progressive    Patient requires at least 2 midnights for further evaluation and treatment of present condition.     Objective: Vitals:   06/16/21 2028 06/17/21 0406 06/17/21 1457 06/17/21 1608  BP: (!) 173/69 (!) 175/72 (!) 153/63   Pulse: 70 64 (!) 56 60  Resp: 20 14 17    Temp: 98.4 F (36.9 C) 98.8 F (37.1 C) 98.2 F (36.8 C)   TempSrc: Oral Oral Oral   SpO2: 96% 96% 100%   Weight:      Height:        Intake/Output Summary (Last 24 hours) at 06/17/2021 1717 Last data filed at 06/17/2021 1334 Gross per 24 hour  Intake 1080 ml  Output 600 ml  Net 480 ml   [employer]  06/15/21 2235  Weight: 85.7 kg    Exam:  General: 80 y.o. year-old male well-developed well-nourished in no acute distress.  He is alert and interactive. Cardiovascular: Regular rate and rhythm no rubs or gallops. Respiratory: Clear to auscultation no wheezes or rales.  Abdomen: Soft nontender normal bowel sounds with  musculoskeletal: No lower extremity edema bilaterally. Skin: No ulcerative lesions noted. Psychiatry: Mood is appropriate for condition and setting.  Data Reviewed: CBC: Recent Labs  Lab 06/15/21 1614 06/16/21 0339 06/17/21 0746   WBC 14.5* 13.0* 8.6  NEUTROABS 11.8*  --   --   HGB 10.9* 11.8* 11.3*  HCT 33.9* 35.3* 33.7*  MCV 90.9 88.5 87.5  PLT 235 223 973   Basic Metabolic Panel: Recent Labs  Lab 06/15/21 1614 06/15/21 1719 06/16/21 0339 06/17/21 0746  NA 144 142 140 136  K 2.6* 4.0 3.8 4.1  CL 124* 106 108 105  CO2 15* 26 24 22   GLUCOSE 201* 49* 144* 340*  BUN 19 33* 33* 32*  CREATININE 1.24 2.26* 2.13* 1.80*  CALCIUM 4.9* 9.3 8.5* 8.5*  MG  --   --   --  1.8  PHOS  --   --   --  2.6   GFR: Estimated Creatinine Clearance: 37.6 mL/min (A) (by C-G formula based on SCr of 1.8 mg/dL (H)). Liver Function Tests: Recent Labs  Lab 06/15/21 1614 06/17/21 0746  AST 18 23  ALT 7 14  ALKPHOS 29* 49  BILITOT 0.6 0.9  PROT 3.6* 5.6*  ALBUMIN 1.8* 2.9*   No results for input(s): LIPASE, AMYLASE in the last 168 hours. No results for input(s): AMMONIA in the last 168 hours. Coagulation Profile: No results for input(s): INR, PROTIME in the last 168 hours. Cardiac Enzymes: No results for input(s): CKTOTAL, CKMB, CKMBINDEX, TROPONINI in the last 168 hours. BNP (last 3 results) No results for input(s): PROBNP in the last 8760 hours. HbA1C: No results for input(s): HGBA1C in the last 72 hours. CBG: Recent Labs  Lab 06/16/21 2022 06/16/21 2353 06/17/21 0404 06/17/21 0724 06/17/21 1145  GLUCAP 364* 402* 362* 319* 192*   Lipid Profile: No results for input(s): CHOL, HDL, LDLCALC, TRIG, CHOLHDL, LDLDIRECT in the last 72 hours. Thyroid Function Tests: No results for input(s): TSH, T4TOTAL, FREET4, T3FREE, THYROIDAB in the last 72 hours. Anemia Panel: Recent Labs    06/16/21 0339  FOLATE 10.0  TIBC 188*  IRON 21*   Urine analysis:    Component Value Date/Time   COLORURINE YELLOW 06/15/2021 1424   APPEARANCEUR CLEAR 06/15/2021 1424   LABSPEC 1.020 06/15/2021 1424   PHURINE 5.0 06/15/2021 1424   GLUCOSEU NEGATIVE 06/15/2021 1424   HGBUR MODERATE (A) 06/15/2021 1424   BILIRUBINUR NEGATIVE  06/15/2021 1424   KETONESUR NEGATIVE 06/15/2021 1424   PROTEINUR >=300 (A) 06/15/2021 1424   NITRITE NEGATIVE 06/15/2021 1424   LEUKOCYTESUR NEGATIVE 06/15/2021 1424   Sepsis Labs: @LABRCNTIP (procalcitonin:4,lacticidven:4)  ) Recent Results (from the past 240 hour(s))  Resp Panel by RT-PCR (Flu A&B, Covid) Nasopharyngeal Swab     Status: None   Collection Time: 06/15/21  9:03 PM   Specimen: Nasopharyngeal Swab; Nasopharyngeal(NP) swabs in vial transport medium  Result Value Ref Range Status   SARS Coronavirus 2 by RT PCR NEGATIVE NEGATIVE Final    Comment: (NOTE) SARS-CoV-2 target nucleic acids are NOT DETECTED.  The SARS-CoV-2 RNA is generally detectable in upper respiratory specimens during the acute phase of infection. The lowest concentration of SARS-CoV-2 viral copies this assay  can detect is 138 copies/mL. A negative result does not preclude SARS-Cov-2 infection and should not be used as the sole basis for treatment or other patient management decisions. A negative result may occur with  improper specimen collection/handling, submission of specimen other than nasopharyngeal swab, presence of viral mutation(s) within the areas targeted by this assay, and inadequate number of viral copies(<138 copies/mL). A negative result must be combined with clinical observations, patient history, and epidemiological information. The expected result is Negative.  Fact Sheet for Patients:  EntrepreneurPulse.com.au  Fact Sheet for Healthcare Providers:  IncredibleEmployment.be  This test is no t yet approved or cleared by the [place] FDA and  has been authorized for detection and/or diagnosis of SARS-CoV-2 by FDA under an Emergency Use Authorization (EUA). This EUA will remain  in effect (meaning this test can be used) for the duration of the COVID-19 declaration under Section 564(b)(1) of the Act, 21 U.S.C.section 360bbb-3(b)(1), unless the  authorization is terminated  or revoked sooner.       Influenza A by PCR NEGATIVE NEGATIVE Final   Influenza B by PCR NEGATIVE NEGATIVE Final    Comment: (NOTE) The Xpert Xpress SARS-CoV-2/FLU/RSV plus assay is intended as an aid in the diagnosis of influenza from Nasopharyngeal swab specimens and should not be used as a sole basis for treatment. Nasal washings and aspirates are unacceptable for Xpert Xpress SARS-CoV-2/FLU/RSV testing.  Fact Sheet for Patients: EntrepreneurPulse.com.au  Fact Sheet for Healthcare Providers: IncredibleEmployment.be  This test is not yet approved or cleared by the [place] FDA and has been authorized for detection and/or diagnosis of SARS-CoV-2 by FDA under an Emergency Use Authorization (EUA). This EUA will remain in effect (meaning this test can be used) for the duration of the COVID-19 declaration under Section 564(b)(1) of the Act, 21 U.S.C. section 360bbb-3(b)(1), unless the authorization is terminated or revoked.  Performed at [facility], [place] [address] [place], [place] [address]       Studies: No results found.  Scheduled Meds:  amLODipine  10 mg Oral Daily   atorvastatin  10 mg Oral Daily   brimonidine  1 drop Both Eyes BID   carvedilol  3.125 mg Oral BID WC   cholecalciferol  1,000 Units Oral Daily   dorzolamide-timolol  1 drop Both Eyes BID   [START ON 06/18/2021] enoxaparin (LOVENOX) injection  40 mg Subcutaneous Q24H   feeding supplement  1 Container Oral TID BM   insulin aspart  0-5 Units Subcutaneous QHS   insulin aspart  0-9 Units Subcutaneous TID WC   insulin glargine-yfgn  5 Units Subcutaneous Daily   latanoprost  1 drop Both Eyes QHS   multivitamin with minerals  1 tablet Oral Daily   pantoprazole  40 mg Oral BID   tamsulosin  0.4 mg Oral Daily    Continuous Infusions:   LOS: 2 days     Kayleen Memos, MD [facility] Pager  [phone]  If 7PM-7AM, please contact night-coverage [email or web address] Password [email or web address] 06/17/2021, 5:17 PM

## 2021-06-17 NOTE — Evaluation (Signed)
Occupational Therapy Evaluation Patient Details Name: David Ford MRN: [id number] DOB: 1940/10/24 Today's Date: 06/17/2021   History of Present Illness 80 yo male admitted with hypoglycemia, AMS, weaknss. 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  Current level of function may be below patient's typical baseline, however pt giving somewhat vague information and no family present in room.  During this evaluation, patient was limited by generalized weakness contributing to impaired balance in sitting and standing, impaired activity tolerance, and decreased awareness of impairments, all of which has the potential to impact patient's safety and independence during functional mobility, as well as performance for ADLs.  Patient lives with his spouse, who does not typically provide 24/7 supervision and assistance.  Patient demonstrates good rehab potential, and should benefit from continued skilled occupational therapy services while in acute care to maximize safety, independence and quality of life at home.  Continued occupational therapy services in the home is recommended.  ?     Recommendations for follow up therapy are one component of a multi-disciplinary discharge planning process, led by the attending physician.  Recommendations may be updated based on patient status, additional functional criteria and insurance authorization.   Follow Up Recommendations  Home health OT    Assistance Recommended at Discharge Frequent or constant Supervision/Assistance  Functional Status Assessment  Patient has had a recent decline in their functional status and demonstrates the ability to make significant improvements in function in a  reasonable and predictable amount of time.  Equipment Recommendations  BSC/3in1;Tub/shower seat    Recommendations for Other Services       Precautions / Restrictions Precautions Precautions: Fall Restrictions Weight Bearing Restrictions: No      Mobility Bed Mobility Overal bed mobility: Modified Independent                  Transfers Overall transfer level: Needs assistance Equipment used: Rolling walker (2 wheels) Transfers: Sit to/from Stand Sit to Stand: Min assist           General transfer comment: Min As from low EOB to RW.      Balance Overall balance assessment: Needs assistance   Sitting balance-Leahy Scale: Fair dynamic. Pt with posterior drift when moving Ues overhead.      Standing balance support: Bilateral upper extremity supported;Reliant on assistive device for balance Standing balance-Leahy Scale: Poor Standing balance comment: Loss of balance when pt using RW and opening bathroom door, then backing out of way of door and required Min Assist to steady self.             High level balance activites: Backward walking             ADL either performed or assessed with clinical judgement   ADL Overall ADL's : Needs assistance/impaired Eating/Feeding: Independent;Sitting   Grooming: Wash/dry hands;Min guard;Standing   Upper Body Bathing: Supervision/ safety;Sitting   Lower Body Bathing: Sitting/lateral leans;Minimal assistance   Upper Body Dressing : Sitting;Set up;Min guard   Lower Body Dressing: Minimal assistance;Sitting/lateral leans Lower Body Dressing Details (indicate cue type and reason): Increased time/effort to don socks while EOB and Min As for one sock to don over toes using backwards chaining method.  Pt asked if he normally receives assistance  with this and pt responds, "sometimes". Toilet Transfer: Regular Toilet;Minimal assistance;Rolling walker (2 wheels);Ambulation;Min guard;Cueing for safety Toilet Transfer  Details (indicate cue type and reason): Min As needed to control descent to lower commode with pt using grab bar. Pt reports there is a place to push next to his commode but unable to specify. Heavy use of pt pulling up on grab bar to strand with [employer]. Toileting- Clothing Manipulation and Hygiene: Min guard;Sitting/lateral lean Toileting - Clothing Manipulation Details (indicate cue type and reason): Min guard assist to control trunk.     Functional mobility during ADLs: Min guard;Minimal assistance;Rolling walker (2 wheels);Cueing for safety       Vision Baseline Vision/History: 3 Glaucoma Additional Comments: Noted that pt caught wheel of RW on doorframe and able to look down and see obstruction and correct without cues.     Perception     Praxis      Pertinent Vitals/Pain Pain Assessment: No/denies pain     Hand Dominance Right   Extremity/Trunk Assessment Upper Extremity Assessment Upper Extremity Assessment: Overall WFL for tasks assessed   Lower Extremity Assessment Lower Extremity Assessment: Generalized weakness   Cervical / Trunk Assessment Cervical / Trunk Assessment: Normal   Communication Communication Communication: No difficulties   Cognition Arousal/Alertness: Awake/alert Behavior During Therapy: WFL for tasks assessed/performed Overall Cognitive Status: Within Functional Limits for tasks assessed                                 General Comments: soft spoken, Ox4.  Gives rather vague responses to questions regarding prior level of function, mostly "It's okay", but not specifying if and when he has needed assistance.     General Comments       Exercises     Shoulder Instructions      Home Living Family/patient expects to be discharged to:: Private residence Living Arrangements: Spouse/significant other Available Help at Discharge: Family;Available PRN/intermittently Type of Home: House       Home Layout: Two level;Full bath  on main level;Able to live on main level with bedroom/bathroom Alternate Level Stairs-Number of Steps: full flight   Bathroom Shower/Tub: Occupational psychologist: Standard     Home Equipment: Grab bars - tub/shower;None          Prior Functioning/Environment Prior Level of Function : Independent/Modified Independent;Driving               ADLs Comments: Pt reports that he cooks sometimes but his wife performs most of the cooking, cleaning, laundry. Pt reports that he pays the bills and manages his own medication.        OT Problem List: Decreased activity tolerance;Decreased safety awareness;Decreased knowledge of use of DME or AE;Impaired balance (sitting and/or standing)      OT Treatment/Interventions: Self-care/ADL training;Therapeutic exercise;Therapeutic activities;Cognitive remediation/compensation;DME and/or AE instruction;Patient/family education;Balance training    OT Goals(Current goals can be found in the care plan section) Acute Rehab OT Goals Patient Stated Goal: "To eat" OT Goal Formulation: With patient Time For Goal Achievement: 07/01/21 Potential to Achieve Goals: Good ADL Goals Pt Will Perform Lower Body Dressing: with adaptive equipment;sitting/lateral leans;sit to/from stand;with supervision;with set-up Pt Will Transfer to Toilet: with supervision;ambulating Additional ADL Goal #1: Pt will demonstrate improved mentation by scoring <4/10 on short blessed test and answering 4/4 safety questions from the [facility] correctly:   1. What do you do for yourself if you are sick  with a cold.    2. What do you do if you burn yourself and the wound becomes infected.    3. What do you do if you experience severe chest pain and shortness of breath?   4. What number do you call in an emergency?  OT Frequency: Min 2X/week   Barriers to D/C: Decreased caregiver support  Would recommend near 24/7 supervision and assistance if family is able.       Co-evaluation               AM-PAC OT "6 Clicks" Daily Activity     Outcome Measure Help from another person eating meals?: None Help from another person taking care of personal grooming?: A Little Help from another person toileting, which includes using toliet, bedpan, or urinal?: A Little Help from another person bathing (including washing, rinsing, drying)?: A Little Help from another person to put on and taking off regular upper body clothing?: A Little Help from another person to put on and taking off regular lower body clothing?: A Little 6 Click Score: 19   End of Session Equipment Utilized During Treatment: Gait belt;Rolling walker (2 wheels)  Activity Tolerance: Patient tolerated treatment well Patient left: in bed;with call bell/phone within reach;with bed alarm set  OT Visit Diagnosis: Unsteadiness on feet (R26.81);Other symptoms and signs involving cognitive function                Time: 1100-1130 OT Time Calculation (min): 30 min Charges:  OT General Charges $OT Visit: 1 Visit OT Evaluation $OT Eval Low Complexity: 1 Low OT Treatments $Self Care/Home Management : 8-22 mins  Anderson Malta, OT Acute Rehab Services Office: [phone] 06/17/2021  Julien Girt 06/17/2021, 11:45 AM

## 2021-06-17 NOTE — Progress Notes (Signed)
Inpatient Diabetes Program Recommendations  AACE/ADA: New Consensus Statement on Inpatient Glycemic Control (2015)  Target Ranges:  Prepandial:   less than 140 mg/dL      Peak postprandial:   less than 180 mg/dL (1-2 hours)      Critically ill patients:  140 - 180 mg/dL   Lab Results  Component Value Date   GLUCAP 319 (H) 06/17/2021   HGBA1C 7.1 (A) 06/08/2021    Review of Glycemic Control  Diabetes history: DM2 Outpatient Diabetes medications: Jardiance 10 mg QAM, Lantus 10 units QD Current orders for Inpatient glycemic control: Semglee 5 units QD, Novolog 0-9 TID and 0-5 HS  HgbA1C - 7.1% - indicates good glycemic control prior to admission, although has ongoing issues with hypoglycemia resulting in multiple EMS calls and ED visits. Lantus just reduced by PCP on 12/20 to 10 units QD  Has Libre CGM  Hyperglycemia last night and this morning likely from no basal insulin on board  Hypoglycemia likely from taking Lantus at home and not eating/drinking (wife does not think pt can take care of himself)   Inpatient Diabetes Program Recommendations:    Increase Semglee to 8 units QD Add Novolog 3 units TID with meals if eating > 50% (only while inpatient, not for home)  Allow permissive hyperglycemia to prevent lows.   Pt will likely need someone to monitor his blood sugars and give his insulin at home.  Continue to follow.  Thank you. Lorenda Peck, RD, LDN, CDE Inpatient Diabetes Coordinator [phone]

## 2021-06-17 NOTE — TOC Progression Note (Signed)
Transition of Care [facility]) - Progression Note    Patient Details  Name: David Ford MRN: [id number] Date of Birth: [birth year]  Transition of [facility]) CM/SW Contact  Purcell Mouton, RN Phone Number: 06/17/2021, 10:31 AM  Clinical Narrative:    Spoke with pt's wife concerning discharge plans to home with [facility]. TOC will check on [place] to [place].    Expected Discharge Plan: [place] Barriers to Discharge: No Barriers Identified  Expected Discharge Plan and Services Expected Discharge Plan: [place] arrangements for the past 2 months: Single Family Home                                       Social Determinants of Health (SDOH) Interventions    Readmission Risk Interventions No flowsheet data found.

## 2021-06-17 NOTE — Progress Notes (Signed)
Pt CBG 402, pt alert, resting comfortably in bed, Notified NP Olena Heckle, no new orders received and CN aware, will continue plan of care.

## 2021-06-18 ENCOUNTER — Other Ambulatory Visit (HOSPITAL_COMMUNITY): Payer: Self-pay

## 2021-06-18 ENCOUNTER — Ambulatory Visit: Payer: Medicare HMO | Admitting: Physician Assistant

## 2021-06-18 DIAGNOSIS — E162 Hypoglycemia, unspecified: Secondary | ICD-10-CM | POA: Diagnosis not present

## 2021-06-18 LAB — CBC WITH DIFFERENTIAL/PLATELET
Abs Immature Granulocytes: 0.04 10*3/uL (ref 0.00–0.07)
Basophils Absolute: 0.1 10*3/uL (ref 0.0–0.1)
Basophils Relative: 1 %
Eosinophils Absolute: 0.3 10*3/uL (ref 0.0–0.5)
Eosinophils Relative: 3 %
HCT: 34.2 % — ABNORMAL LOW (ref 39.0–52.0)
Hemoglobin: 11.1 g/dL — ABNORMAL LOW (ref 13.0–17.0)
Immature Granulocytes: 1 %
Lymphocytes Relative: 34 %
Lymphs Abs: 2.7 10*3/uL (ref 0.7–4.0)
MCH: 28.7 pg (ref 26.0–34.0)
MCHC: 32.5 g/dL (ref 30.0–36.0)
MCV: 88.4 fL (ref 80.0–100.0)
Monocytes Absolute: 0.8 10*3/uL (ref 0.1–1.0)
Monocytes Relative: 10 %
Neutro Abs: 4.2 10*3/uL (ref 1.7–7.7)
Neutrophils Relative %: 51 %
Platelets: 271 10*3/uL (ref 150–400)
RBC: 3.87 MIL/uL — ABNORMAL LOW (ref 4.22–5.81)
RDW: 13.6 % (ref 11.5–15.5)
WBC: 7.9 10*3/uL (ref 4.0–10.5)
nRBC: 0 % (ref 0.0–0.2)

## 2021-06-18 LAB — COMPREHENSIVE METABOLIC PANEL
ALT: 14 U/L (ref 0–44)
AST: 16 U/L (ref 15–41)
Albumin: 2.9 g/dL — ABNORMAL LOW (ref 3.5–5.0)
Alkaline Phosphatase: 54 U/L (ref 38–126)
Anion gap: 8 (ref 5–15)
BUN: 33 mg/dL — ABNORMAL HIGH (ref 8–23)
CO2: 24 mmol/L (ref 22–32)
Calcium: 8.7 mg/dL — ABNORMAL LOW (ref 8.9–10.3)
Chloride: 106 mmol/L (ref 98–111)
Creatinine, Ser: 1.7 mg/dL — ABNORMAL HIGH (ref 0.61–1.24)
GFR, Estimated: 41 mL/min — ABNORMAL LOW (ref >60)
Glucose, Bld: 232 mg/dL — ABNORMAL HIGH (ref 70–99)
Potassium: 3.9 mmol/L (ref 3.5–5.1)
Sodium: 138 mmol/L (ref 135–145)
Total Bilirubin: 0.6 mg/dL (ref 0.3–1.2)
Total Protein: 5.7 g/dL — ABNORMAL LOW (ref 6.5–8.1)

## 2021-06-18 LAB — MAGNESIUM: Magnesium: 1.8 mg/dL (ref 1.7–2.4)

## 2021-06-18 LAB — PHOSPHORUS: Phosphorus: 3 mg/dL (ref 2.5–4.6)

## 2021-06-18 LAB — GLUCOSE, CAPILLARY: Glucose-Capillary: 241 mg/dL — ABNORMAL HIGH (ref 70–99)

## 2021-06-18 MED ORDER — LANTUS SOLOSTAR 100 UNIT/ML ~~LOC~~ SOPN
5.0000 [IU] | PEN_INJECTOR | Freq: Every day | SUBCUTANEOUS | 0 refills | Status: AC
  Filled 2021-06-18: qty 3, 28d supply, fill #0

## 2021-06-18 MED ORDER — ADULT MULTIVITAMIN W/MINERALS CH
1.0000 | ORAL_TABLET | Freq: Every day | ORAL | 0 refills | Status: AC
  Filled 2021-06-18: qty 90, 90d supply, fill #0

## 2021-06-18 MED ORDER — NOVOLOG FLEXPEN 100 UNIT/ML ~~LOC~~ SOPN
2.0000 [IU] | PEN_INJECTOR | Freq: Three times a day (TID) | SUBCUTANEOUS | 0 refills | Status: DC
  Filled 2021-06-18: qty 3, 28d supply, fill #0

## 2021-06-18 MED ORDER — INSULIN GLARGINE-YFGN 100 UNIT/ML ~~LOC~~ SOLN: 5.0000 [IU] | Freq: Every day | SUBCUTANEOUS | Status: DC

## 2021-06-18 MED ORDER — INSULIN GLARGINE-YFGN 100 UNIT/ML ~~LOC~~ SOLN
5.0000 [IU] | Freq: Every day | SUBCUTANEOUS | Status: DC
  Administered 2021-06-18: 10:00:00 5 [IU] via SUBCUTANEOUS
  Filled 2021-06-18: qty 0.05

## 2021-06-18 MED ORDER — BISACODYL 10 MG RE SUPP
10.0000 mg | Freq: Once | RECTAL | Status: AC
  Administered 2021-06-18: 07:00:00 10 mg via RECTAL
  Filled 2021-06-18: qty 1

## 2021-06-18 MED ORDER — POLYETHYLENE GLYCOL 3350 17 G PO PACK
17.0000 g | PACK | Freq: Every day | ORAL | 0 refills | Status: AC | PRN
  Filled 2021-06-18: qty 14, 14d supply, fill #0

## 2021-06-18 MED ORDER — INSULIN ASPART 100 UNIT/ML IJ SOLN
2.0000 [IU] | Freq: Three times a day (TID) | INTRAMUSCULAR | Status: DC
  Administered 2021-06-18: 08:00:00 2 [IU] via SUBCUTANEOUS

## 2021-06-18 MED ORDER — AMLODIPINE BESYLATE 10 MG PO TABS
10.0000 mg | ORAL_TABLET | Freq: Every day | ORAL | 0 refills | Status: AC
Start: 2021-06-18 — End: 2021-12-20
  Filled 2021-06-18: qty 90, 90d supply, fill #0

## 2021-06-18 NOTE — Discharge Summary (Signed)
Discharge Summary  David Ford [id number] DOB: 1940-07-31  PCP: [name], MD  Admit date: 06/15/2021 Discharge date: 06/18/2021  Time spent: 35 minutes  Recommendations for Outpatient Follow-up:  Follow up with your PCP. Follow-up with your neurologist. Take your medications as prescribed. Continue PT OT with assistance and fall precautions.  Discharge Diagnoses:  Active Hospital Problems   Diagnosis Date Noted   Hypoglycemia 06/15/2021   Acute-on-chronic kidney injury ([place]) 06/16/2021   Anemia 06/16/2021   Hyperlipidemia associated with type 2 diabetes mellitus ([place]) 06/02/2017   Hypertension associated with diabetes ([place]) 06/17/2015    Resolved Hospital Problems  No resolved problems to display.    Discharge Condition: Stable  Diet recommendation: Resume previous diet  Vitals:   06/17/21 1949 06/18/21 0408  BP: (!) 143/63 (!) 147/61  Pulse: 65 (!) 56  Resp: 18 20  Temp: 98.7 F (37.1 C) 98.8 F (37.1 C)  SpO2: 99% 96%    History of present illness:  David Ford is a 80 y.o. male with medical history significant for insulin-dependent type 2 diabetes, hypertension, hyperlipidemia, CKD stage III, history of prostate cancer and cognitive impairment who presents with concerns of hypoglycemia.   Hypoglycemia resolved with decreased dose of insulin.  Home Jardiance was also held.  06/18/2021: Patient was seen and examined .  He has no new complaints.  There were no acute events overnight.  Hospital Course:  Principal Problem:   Hypoglycemia Active Problems:   Hypertension associated with diabetes ([place])   Hyperlipidemia associated with type 2 diabetes mellitus ([place])   Acute-on-chronic kidney injury ([place])   Anemia  Resolved hypoglycemia, likely secondary to overuse of insulin.   Hemoglobin A1c of 7.1 on 06/08/2021. Continue Lantus 5 unit daily and NovoLog 2 unit 3 times daily.  Follow-up with your PCP.  Continue to hold off on Jardiance.    History of esophagitis  Projectile vomiting reported on 06/16/2021. CT abdomen pelvis without contrast nonacute. Nausea and vomiting resolved.  Tolerating a diet. Continue PPI. Follow-up with your PCP.   Resolved AKI on CKD 3B. Presented with creatinine of 2.26  Creatinine back to baseline 1.70 with GFR 41. Continue to avoid nephrotoxic agents Follow-up with your PCP   Anemia of chronic disease No overt bleeding Follow-up with your PCP   HTN Continue home Norvasc and Coreg   HLD Continue home statin.     Discharge Exam: BP (!) 147/61 (BP Location: Left Arm)    Pulse (!) 56    Temp 98.8 F (37.1 C) (Oral)    Resp 20    Ht 6' 1"  (1.854 m)    Wt 85.7 kg    SpO2 96%    BMI 24.92 kg/m  General: 80 y.o. year-old male well developed well nourished in no acute distress.  Alert and interactive. Cardiovascular: Regular rate and rhythm with no rubs or gallops.  No thyromegaly or JVD noted.   Respiratory: Clear to auscultation with no wheezes or rales. Good inspiratory effort. Abdomen: Soft nontender nondistended with normal bowel sounds x4 quadrants. Musculoskeletal: No lower extremity edema. 2/4 pulses in all 4 extremities. Skin: No ulcerative lesions noted or rashes Psychiatry: Mood is appropriate for condition and setting  Discharge Instructions You were cared for by a hospitalist during your hospital stay. If you have any questions about your discharge medications or the care you received while you were in the hospital after you are discharged, you can call the unit and asked to speak with the hospitalist on call  if the hospitalist that took care of you is not available. Once you are discharged, your primary care physician will handle any further medical issues. Please note that NO REFILLS for any discharge medications will be authorized once you are discharged, as it is imperative that you return to your primary care physician (or establish a relationship with a primary care  physician if you do not have one) for your aftercare needs so that they can reassess your need for medications and monitor your lab values.   Allergies as of 06/18/2021   No Known Allergies      Medication List     STOP taking these medications    betamethasone dipropionate 0.05 % cream   empagliflozin 10 MG Tabs tablet Commonly known as: Jardiance   Fluad Quadrivalent 0.5 ML injection Generic drug: influenza vaccine adjuvanted   Pfizer-BioNTech COVID-19 Vacc 30 MCG/0.3ML injection Generic drug: COVID-19 mRNA vaccine (Pfizer)       TAKE these medications    amLODipine 10 MG tablet Commonly known as: NORVASC Take 1 tablet (10 mg total) by mouth daily.   atorvastatin 10 MG tablet Commonly known as: LIPITOR Take 1 tablet (10 mg total) by mouth daily.   Azelastine HCl 137 MCG/SPRAY Soln USE 2 SPRAYS INTO BOTH NOSTRILS 2 (TWO) TIMES DAILY. What changed: See the new instructions.   brimonidine 0.2 % ophthalmic solution Commonly known as: ALPHAGAN Place 1 drop into both eyes in the morning and at bedtime. Original SIG was 1 gtt ou tid  But pt states only taking 1 gtt ou bid   carvedilol 3.125 MG tablet Commonly known as: COREG Take 1 tablet (3.125 mg total) by mouth 2 (two) times daily with a meal.   dorzolamide-timolol 22.3-6.8 MG/ML ophthalmic solution Commonly known as: COSOPT Place 1 drop into both eyes 2 (two) times daily.   fluticasone 50 MCG/ACT nasal spray Commonly known as: FLONASE Place 2 sprays into both nostrils daily.   FreeStyle Libre 2 Reader Kerrin Mo USe 4 times daily to check blood dsugar   FreeStyle Libre 2 Sensor Misc Use 4 times daily to check blood sugar.   Lantus SoloStar 100 UNIT/ML Solostar Pen Generic drug: insulin glargine Inject 5 Units into the skin daily. What changed:  how much to take how to take this when to take this additional instructions   latanoprost 0.005 % ophthalmic solution Commonly known as: XALATAN INSTILL 1  DROP INTO BOTH EYES AT BEDTIME   multivitamin with minerals Tabs tablet Take 1 tablet by mouth daily.   NovoLOG FlexPen 100 UNIT/ML FlexPen Generic drug: insulin aspart Inject 2 Units into the skin 3 (three) times daily with meals.   pantoprazole 40 MG tablet Commonly known as: PROTONIX Take 1 tablet (40 mg total) by mouth 2 (two) times daily. What changed: when to take this   Pen Needles 32G X 5 MM Misc 1 application by Does not apply route 4 (four) times daily as needed (to inject insulin). What changed: Another medication with the same name was removed. Continue taking this medication, and follow the directions you see here.   polyethylene glycol 17 g packet Commonly known as: MiraLax Take 17 g by mouth daily as needed for mild constipation.   tamsulosin 0.4 MG Caps capsule Commonly known as: FLOMAX Take 1 capsule (0.4 mg total) by mouth daily.   traZODone 150 MG tablet Commonly known as: DESYREL TAKE 2 TABLETS AT BEDTIME What changed:  how much to take when to take this reasons to take  this   triamcinolone ointment 0.5 % Commonly known as: KENALOG APPLY TOPICALLY 2 TIMES DAILY What changed: See the new instructions.   True Metrix Blood Glucose Test test strip Generic drug: glucose blood TEST BLOOD SUGAR FOUR TIMES DAILY AND AS NEEDED   True Metrix Meter w/Device Kit USE AS DIRECTED   TRUEplus Lancets 33G Misc CHECK BLOOD SUGAR FOUR TIMES DAILY  AND AS NEEDED What changed: See the new instructions.   Vitamin D3 25 MCG tablet Commonly known as: Vitamin D Take 1,000 Units by mouth daily.       No Known Allergies  Follow-up Information     [name], MD. Call today.   Specialty: Family Medicine Why: Please call for a post hospital follow up appointment Contact information: [place] [address] [phone]         Care, [facility] Follow up.   Specialty: Home Health Services Contact information: [place] [id number] [phone]         Cameron Sprang, MD. Call today.   Specialty: Neurology Why: Please call for a post hospital follow up appointment Contact information: [place]  [phone]                  The results of significant diagnostics from this hospitalization (including imaging, microbiology, ancillary and laboratory) are listed below for reference.    Significant Diagnostic Studies: CT ABDOMEN PELVIS WO CONTRAST  Result Date: 06/16/2021 CLINICAL DATA:  Nausea and vomiting. EXAM: CT ABDOMEN AND PELVIS WITHOUT CONTRAST TECHNIQUE: Multidetector CT imaging of the abdomen and pelvis was performed following the standard protocol without IV contrast. COMPARISON:  CT abdomen pelvis dated January 05, 2020. FINDINGS: Lower chest: No acute abnormality. Minimal atelectasis in the left lower lobe. Hepatobiliary: No focal liver abnormality. Tiny gallstones. No gallbladder wall thickening or biliary dilatation. Pancreas: Atrophic. No ductal dilatation or surrounding inflammatory changes. Spleen: Normal in size without focal abnormality. Adrenals/Urinary Tract: Unchanged bilateral adrenal gland thickening without discrete nodule. Unchanged 2.0 cm complex cyst in the midpole of the right kidney. No renal calculi or hydronephrosis. The bladder is unremarkable. Stomach/Bowel: Unchanged small hiatal hernia. The stomach is otherwise within normal limits. No bowel wall thickening, distention, or surrounding inflammatory changes. Normal appendix. Vascular/Lymphatic: Aortic atherosclerosis. No enlarged abdominal or pelvic lymph nodes. Reproductive: Unchanged brachytherapy seeds in the prostate gland. Penile pump again noted. Other: Unchanged small left inguinal hernia containing fat and fluid. No free fluid or pneumoperitoneum. Musculoskeletal: No acute or significant osseous findings. IMPRESSION: 1. No acute intra-abdominal process. 2.  Unchanged cholelithiasis. 3. Aortic Atherosclerosis (ICD10-I70.0). Electronically Signed   By: Titus Dubin M.D.   On: 06/16/2021 16:55   DG Chest Portable 1 View  Result Date: 06/15/2021 CLINICAL DATA:  Hypoglycemia.  Evaluate for occult infection EXAM: PORTABLE CHEST 1 VIEW COMPARISON:  06/04/2021. FINDINGS: Normal heart size. No pleural effusion or edema identified. No airspace opacities. The visualized osseous structures are unremarkable. IMPRESSION: No active disease. Electronically Signed   By: Kerby Moors M.D.   On: 06/15/2021 16:27   DG Chest Port 1 View  Result Date: 06/04/2021 CLINICAL DATA:  Altered mental status EXAM: PORTABLE CHEST 1 VIEW COMPARISON:  12/27/2019 FINDINGS: Lungs are clear.  No pleural effusion or pneumothorax. The heart is normal in size. IMPRESSION: No evidence of acute cardiopulmonary disease. Electronically Signed   By: Julian Hy M.D.   On: 06/04/2021 01:52  Microbiology: Recent Results (from the past 240 hour(s))  Resp Panel by RT-PCR (Flu A&B, Covid) Nasopharyngeal Swab     Status: None   Collection Time: 06/15/21  9:03 PM   Specimen: Nasopharyngeal Swab; Nasopharyngeal(NP) swabs in vial transport medium  Result Value Ref Range Status   SARS Coronavirus 2 by RT PCR NEGATIVE NEGATIVE Final    Comment: (NOTE) SARS-CoV-2 target nucleic acids are NOT DETECTED.  The SARS-CoV-2 RNA is generally detectable in upper respiratory specimens during the acute phase of infection. The lowest concentration of SARS-CoV-2 viral copies this assay can detect is 138 copies/mL. A negative result does not preclude SARS-Cov-2 infection and should not be used as the sole basis for treatment or other patient management decisions. A negative result may occur with  improper specimen collection/handling, submission of specimen other than nasopharyngeal swab, presence of viral mutation(s) within the areas targeted by this assay, and inadequate number of  viral copies(<138 copies/mL). A negative result must be combined with clinical observations, patient history, and epidemiological information. The expected result is Negative.  Fact Sheet for Patients:  EntrepreneurPulse.com.au  Fact Sheet for Healthcare Providers:  IncredibleEmployment.be  This test is no t yet approved or cleared by the [place] FDA and  has been authorized for detection and/or diagnosis of SARS-CoV-2 by FDA under an Emergency Use Authorization (EUA). This EUA will remain  in effect (meaning this test can be used) for the duration of the COVID-19 declaration under Section 564(b)(1) of the Act, 21 U.S.C.section 360bbb-3(b)(1), unless the authorization is terminated  or revoked sooner.       Influenza A by PCR NEGATIVE NEGATIVE Final   Influenza B by PCR NEGATIVE NEGATIVE Final    Comment: (NOTE) The Xpert Xpress SARS-CoV-2/FLU/RSV plus assay is intended as an aid in the diagnosis of influenza from Nasopharyngeal swab specimens and should not be used as a sole basis for treatment. Nasal washings and aspirates are unacceptable for Xpert Xpress SARS-CoV-2/FLU/RSV testing.  Fact Sheet for Patients: EntrepreneurPulse.com.au  Fact Sheet for Healthcare Providers: IncredibleEmployment.be  This test is not yet approved or cleared by the [place] FDA and has been authorized for detection and/or diagnosis of SARS-CoV-2 by FDA under an Emergency Use Authorization (EUA). This EUA will remain in effect (meaning this test can be used) for the duration of the COVID-19 declaration under Section 564(b)(1) of the Act, 21 U.S.C. section 360bbb-3(b)(1), unless the authorization is terminated or revoked.  Performed at [facility], [place] [address], [place], [place] [address]      Labs: Basic Metabolic Panel: Recent Labs  Lab 06/15/21 1614 06/15/21 1719 06/16/21 0339  06/17/21 0746 06/18/21 0343  NA 144 142 140 136 138  K 2.6* 4.0 3.8 4.1 3.9  CL 124* 106 108 105 106  CO2 15* 26 24 22 24   GLUCOSE 201* 49* 144* 340* 232*  BUN 19 33* 33* 32* 33*  CREATININE 1.24 2.26* 2.13* 1.80* 1.70*  CALCIUM 4.9* 9.3 8.5* 8.5* 8.7*  MG  --   --   --  1.8 1.8  PHOS  --   --   --  2.6 3.0   Liver Function Tests: Recent Labs  Lab 06/15/21 1614 06/17/21 0746 06/18/21 0343  AST 18 23 16   ALT 7 14 14   ALKPHOS 29* 49 54  BILITOT 0.6 0.9 0.6  PROT 3.6* 5.6* 5.7*  ALBUMIN 1.8* 2.9* 2.9*   No results for input(s): LIPASE, AMYLASE in the last 168 hours. No results for  input(s): AMMONIA in the last 168 hours. CBC: Recent Labs  Lab 06/15/21 1614 06/16/21 0339 06/17/21 0746 06/18/21 0343  WBC 14.5* 13.0* 8.6 7.9  NEUTROABS 11.8*  --   --  4.2  HGB 10.9* 11.8* 11.3* 11.1*  HCT 33.9* 35.3* 33.7* 34.2*  MCV 90.9 88.5 87.5 88.4  PLT 235 223 240 271   Cardiac Enzymes: No results for input(s): CKTOTAL, CKMB, CKMBINDEX, TROPONINI in the last 168 hours. BNP: BNP (last 3 results) No results for input(s): BNP in the last 8760 hours.  ProBNP (last 3 results) No results for input(s): PROBNP in the last 8760 hours.  CBG: Recent Labs  Lab 06/17/21 0724 06/17/21 1145 06/17/21 1809 06/17/21 1946 06/18/21 0748  GLUCAP 319* 192* 194* 175* 241*       Signed:  Kayleen Memos, MD [facility] 06/18/2021, 6:10 PM

## 2021-06-18 NOTE — Progress Notes (Incomplete)
Assessment/Plan:   David Ford is a very pleasant 80 y.o. year old RH male with risk factors including  age, hypertension, hyperlipidemia, DM2, CKD 3, anemia, history of prostate cancer, insomnia, glaucoma, anemia, and  seen today for evaluation of memory loss. MoCA today     Recommendations:   Memory Loss   MRI brain with/without contrast to assess for underlying structural abnormality and assess vascular load  Neurocognitive testing to further evaluate cognitive concerns and determine underlying cause of memory changes, including potential contribution from sleep, anxiety, or depression  Check B12, TSH Discussed safety both in and out of the home.  Discussed the importance of regular daily schedule with inclusion of crossword puzzles to maintain brain function.  Continue to monitor mood with PCP.  Stay active at least 30 minutes at least 3 times a week.  Naps should be scheduled and should be no longer than 60 minutes and should not occur after 2 PM.  Mediterranean diet is recommended  Folllow up once results above are available   Subjective:    The patient is seen in neurologic consultation at the request of [name], MD for the evaluation of memory.  The patient is accompanied by  who supplements the history. This is a 80 y.o. year old RH  male who has had memory issues for about     Memory Repeats same stories and asks same questions Does not know what came to the room for Leaving objects  Drive Lives with Mood Depression Irritability CW puzzles Word Finding Board Games Painting Coloring Sleeps Vivid Dreams Sleepwalking Hallucinations Paranoia Hygiene concerns Bathing Dressing Medications pillbox Finances  Appetite  trouble swallowing.  Cooks.  stove on or the faucet on. Ambulates   Falls Head injuries    Denies headaches, double vision, dizziness, focal numbness or tingling, unilateral weakness or tremors or anosmia. No history of seizures.  Denies urine incontinence, retention, constipation or diarrhea.  Denies OSA, ETOH or Tobacco. Family History     No Known Allergies  Current Outpatient Medications  Medication Instructions   amLODipine (NORVASC) 10 mg, Oral, Daily   atorvastatin (LIPITOR) 10 mg, Oral, Daily   Azelastine HCl 137 MCG/SPRAY SOLN USE 2 SPRAYS INTO BOTH NOSTRILS 2 (TWO) TIMES DAILY.   BD PEN NEEDLE NANO U/F 32G X 4 MM MISC USE WITH PEN INJECTOR DAILY   betamethasone dipropionate 1.19 % cream 1 application, Topical, 2 times daily PRN   Blood Glucose Monitoring Suppl (TRUE METRIX METER) w/Device KIT USE AS DIRECTED   brimonidine (ALPHAGAN) 0.2 % ophthalmic solution 1 drop, Both Eyes, 2 times daily, Original SIG was 1 gtt ou tid <BR>But pt states only taking 1 gtt ou bid   carvedilol (COREG) 3.125 mg, Oral, 2 times daily with meals   Continuous Blood Gluc Receiver (FREESTYLE LIBRE 2 READER) DEVI USe 4 times daily to check blood dsugar   Continuous Blood Gluc Sensor (FREESTYLE LIBRE 2 SENSOR) MISC Use 4 times daily to check blood sugar.   dorzolamide-timolol (COSOPT) 22.3-6.8 MG/ML ophthalmic solution 1 drop, Both Eyes, 2 times daily   empagliflozin (JARDIANCE) 10 mg, Oral, Daily before breakfast   FLUAD QUADRIVALENT 0.5 ML injection No dose, route, or frequency recorded.   fluticasone (FLONASE) 50 MCG/ACT nasal spray 2 sprays, Each Nare, Daily   Insulin Pen Needle (PEN NEEDLES) 32G X 5 MM MISC 1 application, Does not apply, 4 times daily PRN   Lantus SoloStar 5 Units, Subcutaneous, Daily, INJECT 30 TO 50 UNITS INTO  THE SKIN DAILY.   latanoprost (XALATAN) 0.005 % ophthalmic solution INSTILL 1 DROP INTO BOTH EYES AT BEDTIME   Multiple Vitamin (MULTIVITAMIN WITH MINERALS) TABS tablet 1 tablet, Oral, Daily   NovoLOG FlexPen 2 Units, Subcutaneous, 3 times daily with meals   pantoprazole (PROTONIX) 40 mg, Oral, 2 times daily   PFIZER-BIONTECH COVID-19 VACC 30 MCG/0.3ML injection No dose, route, or frequency recorded.    polyethylene glycol (MIRALAX) 17 g, Oral, Daily PRN   tamsulosin (FLOMAX) 0.4 mg, Oral, Daily   traZODone (DESYREL) 150 MG tablet TAKE 2 TABLETS AT BEDTIME   triamcinolone ointment (KENALOG) 0.5 % APPLY TOPICALLY 2 TIMES DAILY   TRUE METRIX BLOOD GLUCOSE TEST test strip TEST BLOOD SUGAR FOUR TIMES DAILY AND AS NEEDED   TRUEplus Lancets 33G MISC CHECK BLOOD SUGAR FOUR TIMES DAILY  AND AS NEEDED   Vitamin D3 (VITAMIN D) 1,000 Units, Oral, Daily     PHYSICAL EXAM   HEENT:  Normocephalic, atraumatic. The mucous membranes are moist. The superficial temporal arteries are without ropiness or tenderness. Cardiovascular: Regular rate and rhythm. Lungs: Clear to auscultation bilaterally. Neck: There are no carotid bruits noted bilaterally.  NEUROLOGICAL: No flowsheet data found. No flowsheet data found.  No flowsheet data found.   Orientation:  Alert and oriented to person, place and time. No aphasia or dysarthria. Fund of knowledge is appropriate. Recent memory impaired and remote memory intact.  Attention and concentration are normal.  Able to name objects and repeat phrases. Delayed recall  /5 Cranial nerves: There is good facial symmetry. Extraocular muscles are intact and visual fields are full to confrontational testing. Speech is fluent and clear. Soft palate rises symmetrically and there is no tongue deviation. Hearing is intact to conversational tone. Tone: Tone is good throughout. Sensation: Sensation is intact to light touch and pinprick throughout. Vibration is intact at the bilateral big toe.There is no extinction with double simultaneous stimulation. There is no sensory dermatomal level identified. Coordination: The patient has no difficulty with RAM's or FNF bilaterally. Normal finger to nose  Motor: Strength is 5/5 in the bilateral upper and lower extremities. There is no pronator drift. There are no fasciculations noted. DTR's: Deep tendon reflexes are 2/4 at the bilateral  biceps, triceps, brachioradialis, patella and achilles.  Plantar responses are downgoing bilaterally. Gait and Station: The patient is able to ambulate without difficulty.The patient is able to heel toe walk without any difficulty.The patient is able to ambulate in a tandem fashion. The patient is able to stand in the Romberg position.     Thank you for allowing [place] the opportunity to participate in the care of this nice patient. Please do not hesitate to contact us for any questions or concerns.   Total time spent on today's visit was 60 minutes, including both face-to-face time and nonface-to-face time.  Time included that spent on review of records (prior notes available to me/labs/imaging if pertinent), discussing treatment and goals, answering patient's questions and coordinating care.  Cc:  [name], MD  Sharene Butters 06/18/2021 7:14 AM

## 2021-06-18 NOTE — Progress Notes (Signed)
Patient discharged home with wife.  IV removed - WNL.  Reviewed AVS and medications - all questions answered to satisfaction.  HH in place per TOC.  Patient and wife verbalize understanding, patient in NAD at this time.  Will be assisted off unit via [place] when finished eating meal.

## 2021-06-18 NOTE — TOC Progression Note (Signed)
Transition of Care [facility]) - Progression Note    Patient Details  Name: David Ford MRN: [id number] Date of Birth: 07/19/1940  Transition of Care [facility]) CM/SW Contact  Purcell Mouton, RN Phone Number: 06/18/2021, 9:42 AM  Clinical Narrative:     Alvis Lemmings will follow for HHPT.  Expected Discharge Plan: [place] Barriers to Discharge: No Barriers Identified  Expected Discharge Plan and Services Expected Discharge Plan: [place] arrangements for the past 2 months: Single Family Home Expected Discharge Date: 06/18/21                                     Social Determinants of Health (SDOH) Interventions    Readmission Risk Interventions No flowsheet data found.

## 2021-06-18 NOTE — Patient Instructions (Incomplete)
It was a pleasure to see you today at our office.   Recommendations:  Neurocognitive evaluation at our office MRI of the brain, the radiology office will call you to arrange you appointment Check labs today Follow up after neurocognitive testing  RECOMMENDATIONS FOR ALL PATIENTS WITH MEMORY PROBLEMS: 1. Continue to exercise (Recommend 30 minutes of walking everyday, or 3 hours every week) 2. Increase social interactions - continue going to [place] and enjoy social gatherings with friends and family 3. Eat healthy, avoid fried foods and eat more fruits and vegetables 4. Maintain adequate blood pressure, blood sugar, and blood cholesterol level. Reducing the risk of stroke and cardiovascular disease also helps promoting better memory. 5. Avoid stressful situations. Live a simple life and avoid aggravations. Organize your time and prepare for the next day in anticipation. 6. Sleep well, avoid any interruptions of sleep and avoid any distractions in the bedroom that may interfere with adequate sleep quality 7. Avoid sugar, avoid sweets as there is a strong link between excessive sugar intake, diabetes, and cognitive impairment We discussed the Mediterranean diet, which has been shown to help patients reduce the risk of progressive memory disorders and reduces cardiovascular risk. This includes eating fish, eat fruits and green leafy vegetables, nuts like almonds and hazelnuts, walnuts, and also use olive oil. Avoid fast foods and fried foods as much as possible. Avoid sweets and sugar as sugar use has been linked to worsening of memory function.  There is always a concern of gradual progression of memory problems. If this is the case, then we may need to adjust level of care according to patient needs. Support, both to the patient and caregiver, should then be put into place.      You have been referred for a neuropsychological evaluation (i.e., evaluation of memory and thinking abilities). Please  bring someone with you to this appointment if possible, as it is helpful for the doctor to hear from both you and another adult who knows you well. Please bring eyeglasses and hearing aids if you wear them.    The evaluation will take approximately 3 hours and has two parts:   The first part is a clinical interview with the neuropsychologist (Dr. Melvyn Novas or Dr. Nicole Kindred). During the interview, the neuropsychologist will speak with you and the individual you brought to the appointment.    The second part of the evaluation is testing with the doctor's technician Hinton Dyer or Maudie Mercury). During the testing, the technician will ask you to remember different types of material, solve problems, and answer some questionnaires. Your family member will not be present for this portion of the evaluation.   Please note: We must reserve several hours of the neuropsychologist's time and the psychometrician's time for your evaluation appointment. As such, there is a No-Show fee of $100. If you are unable to attend any of your appointments, please contact our office as soon as possible to reschedule.    FALL PRECAUTIONS: Be cautious when walking. Scan the area for obstacles that may increase the risk of trips and falls. When getting up in the mornings, sit up at the edge of the bed for a few minutes before getting out of bed. Consider elevating the bed at the head end to avoid drop of blood pressure when getting up. Walk always in a well-lit room (use night lights in the walls). Avoid area rugs or power cords from appliances in the middle of the walkways. Use a walker or a cane if necessary and  consider physical therapy for balance exercise. Get your eyesight checked regularly.  FINANCIAL OVERSIGHT: Supervision, especially oversight when making financial decisions or transactions is also recommended.  HOME SAFETY: Consider the safety of the kitchen when operating appliances like stoves, microwave oven, and blender. Consider having  supervision and share cooking responsibilities until no longer able to participate in those. Accidents with firearms and other hazards in the house should be identified and addressed as well.   ABILITY TO BE LEFT ALONE: If patient is unable to contact 911 operator, consider using LifeLine, or when the need is there, arrange for someone to stay with patients. Smoking is a fire hazard, consider supervision or cessation. Risk of wandering should be assessed by caregiver and if detected at any point, supervision and safe proof recommendations should be instituted.  MEDICATION SUPERVISION: Inability to self-administer medication needs to be constantly addressed. Implement a mechanism to ensure safe administration of the medications.   DRIVING: Regarding driving, in patients with progressive memory problems, driving will be impaired. We advise to have someone else do the driving if trouble finding directions or if minor accidents are reported. Independent driving assessment is available to determine safety of driving.   If you are interested in the driving assessment, you can contact the following:  [employer] in [place]  [place] [phone] or [phone]    [place] refers to food and lifestyle choices that are based on the traditions of countries located on the [employer]. This way of eating has been shown to help prevent certain conditions and improve outcomes for people who have chronic diseases, like kidney disease and heart disease. What are tips for following this plan? Lifestyle  Cook and eat meals together with your family, when possible. Drink enough fluid to keep your urine clear or pale yellow. Be physically active every day. This includes: Aerobic exercise like running or swimming. Leisure activities like gardening,  walking, or housework. Get 7-8 hours of sleep each night. If recommended by your health care provider, drink red wine in moderation. This means 1 glass a day for nonpregnant women and 2 glasses a day for men. A glass of wine equals 5 oz (150 mL). Reading food labels  Check the serving size of packaged foods. For foods such as rice and pasta, the serving size refers to the amount of cooked product, not dry. Check the total fat in packaged foods. Avoid foods that have saturated fat or trans fats. Check the ingredients list for added sugars, such as corn syrup. Shopping  At the grocery store, buy most of your food from the areas near the walls of the store. This includes: Fresh fruits and vegetables (produce). Grains, beans, nuts, and seeds. Some of these may be available in unpackaged forms or large amounts (in bulk). Fresh seafood. Poultry and eggs. Low-fat dairy products. Buy whole ingredients instead of prepackaged foods. Buy fresh fruits and vegetables in-season from local farmers markets. Buy frozen fruits and vegetables in resealable bags. If you do not have access to quality fresh seafood, buy precooked frozen shrimp or canned fish, such as tuna, salmon, or sardines. Buy small amounts of raw or cooked vegetables, salads, or olives from the deli or salad bar at your store. Stock your pantry so you always have certain foods on hand, such as olive oil, canned tuna, canned tomatoes, rice, pasta, and beans. Cooking  Cook foods with extra-virgin  olive oil instead of using butter or other vegetable oils. Have meat as a side dish, and have vegetables or grains as your main dish. This means having meat in small portions or adding small amounts of meat to foods like pasta or stew. Use beans or vegetables instead of meat in common dishes like chili or lasagna. Experiment with different cooking methods. Try roasting or broiling vegetables instead of steaming or sauteing them. Add frozen vegetables  to soups, stews, pasta, or rice. Add nuts or seeds for added healthy fat at each meal. You can add these to yogurt, salads, or vegetable dishes. Marinate fish or vegetables using olive oil, lemon juice, garlic, and fresh herbs. Meal planning  Plan to eat 1 vegetarian meal one day each week. Try to work up to 2 vegetarian meals, if possible. Eat seafood 2 or more times a week. Have healthy snacks readily available, such as: Vegetable sticks with hummus. Greek yogurt. Fruit and nut trail mix. Eat balanced meals throughout the week. This includes: Fruit: 2-3 servings a day Vegetables: 4-5 servings a day Low-fat dairy: 2 servings a day Fish, poultry, or lean meat: 1 serving a day Beans and legumes: 2 or more servings a week Nuts and seeds: 1-2 servings a day Whole grains: 6-8 servings a day Extra-virgin olive oil: 3-4 servings a day Limit red meat and sweets to only a few servings a month What are my food choices? Mediterranean diet Recommended Grains: Whole-grain pasta. Brown rice. Bulgar wheat. Polenta. Couscous. Whole-wheat bread. [place]. Vegetables: Artichokes. Beets. Broccoli. Cabbage. Carrots. Eggplant. Green beans. Chard. Kale. Spinach. Onions. Leeks. Peas. Squash. Tomatoes. Peppers. Radishes. Fruits: Apples. Apricots. Avocado. Berries. Bananas. Cherries. Dates. Figs. Grapes. Lemons. Melon. Oranges. Peaches. Plums. Pomegranate. Meats and other protein foods: Beans. Almonds. Sunflower seeds. Pine nuts. Peanuts. [address]. Salmon. Scallops. Shrimp. [place]. Tilapia. Clams. Oysters. Eggs. Dairy: Low-fat milk. Cheese. Greek yogurt. Beverages: Water. Red wine. Herbal tea. Fats and oils: Extra virgin olive oil. Avocado oil. Grape seed oil. Sweets and desserts: [place] yogurt with honey. Baked apples. Poached pears. Trail mix. Seasoning and other foods: Basil. Cilantro. Coriander. Cumin. Mint. Parsley. Sage. Rosemary. Tarragon. Garlic. Oregano. Thyme. Pepper. Balsalmic vinegar. Tahini.  Hummus. Tomato sauce. Olives. Mushrooms. Limit these Grains: Prepackaged pasta or rice dishes. Prepackaged cereal with added sugar. Vegetables: Deep fried potatoes (french fries). Fruits: Fruit canned in syrup. Meats and other protein foods: Beef. Pork. Lamb. Poultry with skin. Hot dogs. Berniece Salines. Dairy: Ice cream. Sour cream. Whole milk. Beverages: Juice. Sugar-sweetened soft drinks. Beer. Liquor and spirits. Fats and oils: Butter. Canola oil. Vegetable oil. Beef fat (tallow). Lard. Sweets and desserts: Cookies. Cakes. Pies. Candy. Seasoning and other foods: Mayonnaise. Premade sauces and marinades. The items listed may not be a complete list. Talk with your dietitian about what dietary choices are right for you. Summary The Mediterranean diet includes both food and lifestyle choices. Eat a variety of fresh fruits and vegetables, beans, nuts, seeds, and whole grains. Limit the amount of red meat and sweets that you eat. Talk with your health care provider about whether it is safe for you to drink red wine in moderation. This means 1 glass a day for nonpregnant women and 2 glasses a day for men. A glass of wine equals 5 oz (150 mL). This information is not intended to replace advice given to you by your health care provider. Make sure you discuss any questions you have with your health care provider. Document Released: 01/28/2016 Document Revised: 03/01/2016 Document Reviewed:  01/28/2016 Elsevier Interactive Patient Education  2017 [employer].

## 2021-06-18 NOTE — Progress Notes (Signed)
Physical Therapy Treatment Patient Details Name: David Ford MRN: [id number] DOB: [birth year] Today's Date: 06/18/2021   History of Present Illness 80 yo male admitted with hypoglycemia, AMS, weaknss. 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  He does not complain and is soft spoken.  Assisted OOB to amb in hallway.  General Gait Details: assisted with amb around unit with walker < 25% VC's on proper use and safety with turns.  Amb around room with out walker, pt with tendency to hold to furniture.  Also performed a BERG balance test. Greatest difficulty was "alternating step up, "one leg stande" and "one foot placed in front of ather" all resulted in total LOB.  Advised pt to use walker for stability.  Spouse stated pt had an appointment with a Neurologist, but missed it because he was in hospital.   Pt plans to return home with Spouse under her care and [facility] PT plus a new walker.     Recommendations for follow up therapy are one component of a multi-disciplinary discharge planning process, led by the attending physician.  Recommendations may be updated based on patient status, additional functional criteria and insurance authorization.  Follow Up Recommendations  Home health PT     Assistance Recommended at Discharge Frequent or constant Supervision/Assistance  Equipment Recommendations  Rolling walker (2 wheels)    Recommendations for Other Services       Precautions / Restrictions Precautions Precautions: Fall Restrictions Weight Bearing Restrictions: No     Mobility  Bed Mobility Overal bed mobility: Modified Independent             General bed mobility comments: increased time    Transfers Overall transfer level: Needs assistance Equipment used: Rolling walker (2 wheels) Transfers: Sit to/from Stand Sit to Stand: Supervision            General transfer comment: good use of hands to steady self    Ambulation/Gait Ambulation/Gait assistance: Supervision;Min guard Gait Distance (Feet): 225 Feet Assistive device: Rolling walker (2 wheels) Gait Pattern/deviations: Step-through pattern;Decreased stride length       General Gait Details: assisted with amb around unit with walker < 25% VC's on proper use and safety with turns.  Amb around room with out walker, pt with tendency to hold to furniture.  Alsp performed a BERG balance test.   Stairs             Wheelchair Mobility    Modified Rankin (Stroke Patients Only)       Balance Overall balance assessment: Needs assistance         Standing balance support: Bilateral upper extremity supported;Reliant on assistive device for balance                     Standardized Balance Assessment Standardized Balance Assessment : Berg Balance Test Berg Balance Test Sit to Stand: Able to stand without using hands and stabilize independently Standing Unsupported: Able to stand safely 2 minutes Sitting with Back Unsupported but Feet Supported on Floor or Stool: Able to sit safely and securely 2 minutes Stand to Sit: Sits safely with minimal use of hands Transfers: Able to transfer safely, minor use of hands Standing Unsupported with Eyes Closed: Able to stand 10 seconds safely Standing Ubsupported with Feet Together: Able to place feet together independently but unable  to hold for 30 seconds From Standing, Reach Forward with Outstretched Arm: Can reach confidently >25 cm (10") From Standing Position, Pick up Object from Floor: Able to pick up shoe safely and easily From Standing Position, Turn to Look Behind Over each Shoulder: Looks behind from both sides and weight shifts well Turn 360 Degrees: Able to turn 360 degrees safely in 4 seconds or less Standing Unsupported, Alternately Place Feet on Step/Stool: Needs assistance to keep from falling or  unable to try Standing Unsupported, One Foot in Front: Loses balance while stepping or standing Standing on One Leg: Unable to try or needs assist to prevent fall Total Score: 42        Cognition Arousal/Alertness: Awake/alert Behavior During Therapy: Flat affect                                   General Comments: AxO x 3 flat and slightly delayed in his responces.  He does not complain and is soft spoken.        Exercises      General Comments        Pertinent Vitals/Pain      Home Living                          Prior Function            PT Goals (current goals can now be found in the care plan section) Progress towards PT goals: Progressing toward goals    Frequency    Min 3X/week      PT Plan Current plan remains appropriate    Co-evaluation              AM-PAC PT "6 Clicks" Mobility   Outcome Measure  Help needed turning from your back to your side while in a flat bed without using bedrails?: None Help needed moving from lying on your back to sitting on the side of a flat bed without using bedrails?: None Help needed moving to and from a bed to a chair (including a wheelchair)?: A Little Help needed standing up from a chair using your arms (e.g., wheelchair or bedside chair)?: A Little Help needed to walk in hospital room?: A Little Help needed climbing 3-5 steps with a railing? : A Little 6 Click Score: 20    End of Session Equipment Utilized During Treatment: Gait belt Activity Tolerance: Patient tolerated treatment well Patient left: in chair;with call bell/phone within reach;with family/visitor present Nurse Communication: Mobility status PT Visit Diagnosis: Muscle weakness (generalized) (M62.81);Difficulty in walking, not elsewhere classified (R26.2);Unsteadiness on feet (R26.81)     Time: 2993-7169 PT Time Calculation (min) (ACUTE ONLY): 28 min  Charges:  $Gait Training: 8-22 mins $Therapeutic Activity:  8-22 mins                     {Taronda Comacho  PTA Acute  Rehabilitation Services Pager      [phone] Office      [phone]

## 2021-06-22 ENCOUNTER — Telehealth: Payer: Self-pay

## 2021-06-22 ENCOUNTER — Telehealth: Payer: Self-pay | Admitting: Family Medicine

## 2021-06-22 ENCOUNTER — Inpatient Hospital Stay: Payer: Medicare HMO | Admitting: Family Medicine

## 2021-06-22 NOTE — Telephone Encounter (Signed)
..  Home Health Verbal Orders  Agency:  [facility]   Caller: PAM  Contact and title  Requesting OT/ PT/ Skilled nursing/ Social Work/ Speech:  Skilled nursing   Reason for Request:  opened patient on 1/1 - requesting to follow patient for esophagitis that started 06/08/21.  States patient is also diabetic and has had changes in insulin.  Believes patient needs 24 hour supervision due to cognitive changes and incontinence of bowel and bladder.   Frequency:  1 week 2, 2 week 2, 1 week 1 and 3 month 1.  Also requesting 2 PRN's.   HH needs F2F w/in last 30 days

## 2021-06-22 NOTE — Telephone Encounter (Signed)
Pt Daughter called and said she was not able to bring the patient today but that he is doing a lot worse and really needs to be seen. She said he has depression, is not taking his meds and several other things, What would Dr Jerline Pain like to do? Call back [phone].

## 2021-06-22 NOTE — Progress Notes (Incomplete)
Chief Complaint:  Beau Ramsburg is a 81 y.o. male who presents today for a TCM visit.  Assessment/Plan:  New/Acute Problems: ***  Chronic Problems Addressed Today: No problem-specific Assessment & Plan notes found for this encounter.   Patient has a {Desc; moderate/high:110033} level of medical decision making.     Subjective:  HPI:  Summary of Hospital admission: Reason for admission: Hypoglycemia Date of admission: 06/15/2021 Date of discharge: 06/18/2021 Date of Interactive contact: 06/15/2021 Summary of Hospital course: He went to ED for hypoglycemia on 06/15/2021. He was found  unresponsive and incontinent of urine by his wife. He has had multiple hypoglycemic episodes over the last several months. EMS was called. His blood sugar was dropped to 45. He was given D 10 infusion by EMS. Had improvement up to 147. However, his blood sugar was dropped to 49 at ED. Repeated CBG blood sugar was 53. He was admitted and was started on D 5 drip.   Interim history:  We saw him for the similar issue about 13 days ago. At that visit, we decrease his insulin to 10 units of Lantus. He was doing well on this.  He had another episode of hypoglycemia.   ROS: , otherwise a complete review of systems was negative.   PMH:  The following were reviewed and entered/updated in epic: Past Medical History:  Diagnosis Date   AKI (acute kidney injury) ([place]) 06/18/2015   Cancer ([place])    hx of prostate cancer   Chronic kidney disease    Diabetes mellitus without complication ([place])    DKA (diabetic ketoacidoses) 06/18/2015   Glaucoma    Hyperlipidemia    Hypertension    Patient Active Problem List   Diagnosis Date Noted   Acute-on-chronic kidney injury ([place]) 06/16/2021   Anemia 06/16/2021   Hypoglycemia 06/15/2021   Cognitive impairment 06/08/2021   Esophagitis 01/06/2020   Dermatitis 08/20/2018   Glaucoma 01/31/2018   CKD (chronic kidney disease), stage III 08/03/2017    Hyperlipidemia associated with type 2 diabetes mellitus ([place]) 06/02/2017   BPH (benign prostatic hyperplasia) 06/02/2017   History of prostate cancer 06/02/2017   Insomnia 06/02/2017   Hypertension associated with diabetes ([place]) 06/17/2015   T2DM (type 2 diabetes mellitus) ([place]) 06/17/2015   Past Surgical History:  Procedure Laterality Date   AMPUTATION Left 07/03/2015   Procedure: Left Foot 1st and 2nd Ray Amputation;  Surgeon: Newt Minion, MD;  Location: [facility];  Service: Orthopedics;  Laterality: Left;   BASCILIC VEIN TRANSPOSITION Left 07/14/2015   Procedure: BRACHIOBASILIC VEIN TRANSPOSITION  ;  Surgeon: Elam Dutch, MD;  Location: [facility] OR;  Service: Vascular;  Laterality: Left;   IRIDOTOMY / IRIDECTOMY     prostate seeds      Family History  Problem Relation Age of Onset   Diabetes Mother    Hypertension Mother    Stroke Mother    Diabetes Sister    Glaucoma Sister    Diabetes Brother    Glaucoma Brother    Amblyopia Neg Hx    Blindness Neg Hx    Cataracts Neg Hx    Macular degeneration Neg Hx    Retinal detachment Neg Hx    Strabismus Neg Hx    Retinitis pigmentosa Neg Hx     Medications- Reconciled discharge and current medications in Epic.  Current Outpatient Medications  Medication Sig Dispense Refill   amLODipine (NORVASC) 10 MG tablet Take 1 tablet (10 mg total) by mouth daily. 90 tablet 0  atorvastatin (LIPITOR) 10 MG tablet Take 1 tablet (10 mg total) by mouth daily. 90 tablet 1   Azelastine HCl 137 MCG/SPRAY SOLN USE 2 SPRAYS INTO BOTH NOSTRILS 2 (TWO) TIMES DAILY. (Patient taking differently: Place 2 sprays into both nostrils 2 (two) times daily.) 120 mL 5   Blood Glucose Monitoring Suppl (TRUE METRIX METER) w/Device KIT USE AS DIRECTED 1 kit 0   brimonidine (ALPHAGAN) 0.2 % ophthalmic solution Place 1 drop into both eyes in the morning and at bedtime. Original SIG was 1 gtt ou tid  But pt states only taking 1 gtt ou bid     carvedilol (COREG) 3.125 MG  tablet Take 1 tablet (3.125 mg total) by mouth 2 (two) times daily with a meal. 180 tablet 3   Continuous Blood Gluc Receiver (FREESTYLE LIBRE 2 READER) DEVI USe 4 times daily to check blood dsugar 1 each 11   Continuous Blood Gluc Sensor (FREESTYLE LIBRE 2 SENSOR) MISC Use 4 times daily to check blood sugar. 1 each 11   dorzolamide-timolol (COSOPT) 22.3-6.8 MG/ML ophthalmic solution Place 1 drop into both eyes 2 (two) times daily.      fluticasone (FLONASE) 50 MCG/ACT nasal spray Place 2 sprays into both nostrils daily. 16 g 6   insulin aspart (NOVOLOG FLEXPEN) 100 UNIT/ML FlexPen Inject 2 Units into the skin 3 (three) times daily with meals. 15 mL 0   insulin glargine (LANTUS SOLOSTAR) 100 UNIT/ML Solostar Pen Inject 5 Units into the skin daily. 45 mL 0   Insulin Pen Needle (PEN NEEDLES) 32G X 5 MM MISC 1 application by Does not apply route 4 (four) times daily as needed (to inject insulin). 100 each 11   latanoprost (XALATAN) 0.005 % ophthalmic solution INSTILL 1 DROP INTO BOTH EYES AT BEDTIME (Patient taking differently: Place 1 drop into both eyes at bedtime.) 3 mL 3   Multiple Vitamin (MULTIVITAMIN WITH MINERALS) TABS tablet Take 1 tablet by mouth daily. 90 tablet 0   pantoprazole (PROTONIX) 40 MG tablet Take 1 tablet (40 mg total) by mouth 2 (two) times daily. (Patient taking differently: Take 40 mg by mouth daily.) 180 tablet 3   polyethylene glycol (MIRALAX) 17 g packet Take 17 g by mouth daily as needed for mild constipation. 14 each 0   tamsulosin (FLOMAX) 0.4 MG CAPS capsule Take 1 capsule (0.4 mg total) by mouth daily. 90 capsule 3   traZODone (DESYREL) 150 MG tablet TAKE 2 TABLETS AT BEDTIME (Patient taking differently: Take 150 mg by mouth at bedtime as needed for sleep.) 180 tablet 0   triamcinolone ointment (KENALOG) 0.5 % APPLY TOPICALLY 2 TIMES DAILY (Patient taking differently: Apply 1 application topically 2 (two) times daily.) 30 g 2   TRUE METRIX BLOOD GLUCOSE TEST test strip  TEST BLOOD SUGAR FOUR TIMES DAILY AND AS NEEDED 450 strip 1   TRUEplus Lancets 33G MISC CHECK BLOOD SUGAR FOUR TIMES DAILY  AND AS NEEDED (Patient taking differently: 1 each by Other route See admin instructions. CHECK BLOOD SUGAR FOUR TIMES DAILY  AND AS NEEDED) 500 each 3   Vitamin D3 (VITAMIN D) 25 MCG tablet Take 1,000 Units by mouth daily.     No current facility-administered medications for this visit.    Allergies-reviewed and updated No Known Allergies  Social History   Socioeconomic History   Marital status: Divorced    Spouse name: Not on file   Number of children: Not on file   Years of education: Not on file  Highest education level: Not on file  Occupational History   Not on file  Tobacco Use   Smoking status: Never   Smokeless tobacco: Never  Vaping Use   Vaping Use: Never used  Substance and Sexual Activity   Alcohol use: Yes    Comment: once a week   Drug use: No   Sexual activity: Not on file  Other Topics Concern   Not on file  Social History Narrative   Not on file   Social Determinants of Health   Financial Resource Strain: Not on file  Food Insecurity: Not on file  Transportation Needs: Not on file  Physical Activity: Not on file  Stress: Not on file  Social Connections: Not on file        Objective:  Physical Exam: There were no vitals taken for this visit.  Gen: NAD, resting comfortably*** CV: RRR with no murmurs appreciated Pulm: NWOB, CTAB with no crackles, wheezes, or rhonchi GI: Normal bowel sounds present. Soft, Nontender, Nondistended. MSK: No edema, cyanosis, or clubbing noted Skin: Warm, dry Neuro: Grossly normal, moves all extremities Psych: Normal affect and thought content  Summary/Review of work up during hospitalization: ***      I,Savera Zaman,acting as a Education administrator for [employer], MD.,have documented all relevant documentation on the behalf of Dimas Chyle, MD,as directed by  Dimas Chyle, MD while in the presence of  Dimas Chyle, MD.   *** [name]. Jerline Pain, MD 06/22/2021 10:42 AM

## 2021-06-23 ENCOUNTER — Inpatient Hospital Stay (HOSPITAL_COMMUNITY)
Admission: EM | Admit: 2021-06-23 | Discharge: 2021-07-01 | DRG: 064 | Disposition: A | Discharge status: Skilled Nursing Facility | Payer: Medicare HMO | Attending: Internal Medicine | Admitting: Internal Medicine

## 2021-06-23 ENCOUNTER — Emergency Department (HOSPITAL_COMMUNITY): Payer: Medicare HMO

## 2021-06-23 DIAGNOSIS — Z20822 Contact with and (suspected) exposure to covid-19: Secondary | ICD-10-CM | POA: Diagnosis present

## 2021-06-23 DIAGNOSIS — Z79899 Other long term (current) drug therapy: Secondary | ICD-10-CM

## 2021-06-23 DIAGNOSIS — Z9181 History of falling: Secondary | ICD-10-CM | POA: Diagnosis not present

## 2021-06-23 DIAGNOSIS — R569 Unspecified convulsions: Secondary | ICD-10-CM | POA: Diagnosis present

## 2021-06-23 DIAGNOSIS — R2681 Unsteadiness on feet: Secondary | ICD-10-CM | POA: Diagnosis not present

## 2021-06-23 DIAGNOSIS — G9341 Metabolic encephalopathy: Secondary | ICD-10-CM | POA: Diagnosis present

## 2021-06-23 DIAGNOSIS — R410 Disorientation, unspecified: Secondary | ICD-10-CM | POA: Diagnosis not present

## 2021-06-23 DIAGNOSIS — N183 Chronic kidney disease, stage 3 unspecified: Secondary | ICD-10-CM | POA: Diagnosis not present

## 2021-06-23 DIAGNOSIS — E860 Dehydration: Secondary | ICD-10-CM | POA: Diagnosis present

## 2021-06-23 DIAGNOSIS — N179 Acute kidney failure, unspecified: Secondary | ICD-10-CM | POA: Diagnosis present

## 2021-06-23 DIAGNOSIS — G319 Degenerative disease of nervous system, unspecified: Secondary | ICD-10-CM | POA: Diagnosis not present

## 2021-06-23 DIAGNOSIS — R1312 Dysphagia, oropharyngeal phase: Secondary | ICD-10-CM | POA: Diagnosis not present

## 2021-06-23 DIAGNOSIS — F0390 Unspecified dementia without behavioral disturbance: Secondary | ICD-10-CM | POA: Diagnosis present

## 2021-06-23 DIAGNOSIS — G8194 Hemiplegia, unspecified affecting left nondominant side: Secondary | ICD-10-CM | POA: Diagnosis present

## 2021-06-23 DIAGNOSIS — Z89422 Acquired absence of other left toe(s): Secondary | ICD-10-CM | POA: Diagnosis not present

## 2021-06-23 DIAGNOSIS — F05 Delirium due to known physiological condition: Secondary | ICD-10-CM | POA: Diagnosis present

## 2021-06-23 DIAGNOSIS — I5032 Chronic diastolic (congestive) heart failure: Secondary | ICD-10-CM | POA: Diagnosis present

## 2021-06-23 DIAGNOSIS — R29708 NIHSS score 8: Secondary | ICD-10-CM | POA: Diagnosis present

## 2021-06-23 DIAGNOSIS — H409 Unspecified glaucoma: Secondary | ICD-10-CM | POA: Diagnosis present

## 2021-06-23 DIAGNOSIS — Z8546 Personal history of malignant neoplasm of prostate: Secondary | ICD-10-CM | POA: Diagnosis not present

## 2021-06-23 DIAGNOSIS — E1159 Type 2 diabetes mellitus with other circulatory complications: Secondary | ICD-10-CM | POA: Diagnosis present

## 2021-06-23 DIAGNOSIS — E119 Type 2 diabetes mellitus without complications: Secondary | ICD-10-CM

## 2021-06-23 DIAGNOSIS — N1832 Chronic kidney disease, stage 3b: Secondary | ICD-10-CM | POA: Diagnosis present

## 2021-06-23 DIAGNOSIS — Z823 Family history of stroke: Secondary | ICD-10-CM

## 2021-06-23 DIAGNOSIS — E1169 Type 2 diabetes mellitus with other specified complication: Secondary | ICD-10-CM | POA: Diagnosis present

## 2021-06-23 DIAGNOSIS — R41841 Cognitive communication deficit: Secondary | ICD-10-CM | POA: Diagnosis not present

## 2021-06-23 DIAGNOSIS — I6601 Occlusion and stenosis of right middle cerebral artery: Secondary | ICD-10-CM | POA: Diagnosis not present

## 2021-06-23 DIAGNOSIS — I152 Hypertension secondary to endocrine disorders: Secondary | ICD-10-CM | POA: Diagnosis present

## 2021-06-23 DIAGNOSIS — Z89412 Acquired absence of left great toe: Secondary | ICD-10-CM | POA: Diagnosis not present

## 2021-06-23 DIAGNOSIS — R55 Syncope and collapse: Secondary | ICD-10-CM | POA: Diagnosis not present

## 2021-06-23 DIAGNOSIS — I129 Hypertensive chronic kidney disease with stage 1 through stage 4 chronic kidney disease, or unspecified chronic kidney disease: Secondary | ICD-10-CM | POA: Diagnosis not present

## 2021-06-23 DIAGNOSIS — Z515 Encounter for palliative care: Secondary | ICD-10-CM | POA: Diagnosis not present

## 2021-06-23 DIAGNOSIS — I10 Essential (primary) hypertension: Secondary | ICD-10-CM | POA: Diagnosis not present

## 2021-06-23 DIAGNOSIS — M6258 Muscle wasting and atrophy, not elsewhere classified, other site: Secondary | ICD-10-CM | POA: Diagnosis not present

## 2021-06-23 DIAGNOSIS — R2689 Other abnormalities of gait and mobility: Secondary | ICD-10-CM | POA: Diagnosis not present

## 2021-06-23 DIAGNOSIS — R4182 Altered mental status, unspecified: Secondary | ICD-10-CM | POA: Diagnosis not present

## 2021-06-23 DIAGNOSIS — R739 Hyperglycemia, unspecified: Secondary | ICD-10-CM | POA: Diagnosis not present

## 2021-06-23 DIAGNOSIS — I6381 Other cerebral infarction due to occlusion or stenosis of small artery: Secondary | ICD-10-CM | POA: Diagnosis present

## 2021-06-23 DIAGNOSIS — R278 Other lack of coordination: Secondary | ICD-10-CM | POA: Diagnosis not present

## 2021-06-23 DIAGNOSIS — N400 Enlarged prostate without lower urinary tract symptoms: Secondary | ICD-10-CM | POA: Diagnosis present

## 2021-06-23 DIAGNOSIS — I639 Cerebral infarction, unspecified: Secondary | ICD-10-CM | POA: Diagnosis not present

## 2021-06-23 DIAGNOSIS — I447 Left bundle-branch block, unspecified: Secondary | ICD-10-CM | POA: Diagnosis not present

## 2021-06-23 DIAGNOSIS — R140 Abdominal distension (gaseous): Secondary | ICD-10-CM | POA: Diagnosis present

## 2021-06-23 DIAGNOSIS — E1122 Type 2 diabetes mellitus with diabetic chronic kidney disease: Secondary | ICD-10-CM | POA: Diagnosis present

## 2021-06-23 DIAGNOSIS — R4701 Aphasia: Secondary | ICD-10-CM | POA: Diagnosis present

## 2021-06-23 DIAGNOSIS — D631 Anemia in chronic kidney disease: Secondary | ICD-10-CM | POA: Diagnosis not present

## 2021-06-23 DIAGNOSIS — Z8673 Personal history of transient ischemic attack (TIA), and cerebral infarction without residual deficits: Secondary | ICD-10-CM

## 2021-06-23 DIAGNOSIS — E785 Hyperlipidemia, unspecified: Secondary | ICD-10-CM | POA: Diagnosis present

## 2021-06-23 DIAGNOSIS — I63522 Cerebral infarction due to unspecified occlusion or stenosis of left anterior cerebral artery: Secondary | ICD-10-CM | POA: Diagnosis not present

## 2021-06-23 DIAGNOSIS — Z7401 Bed confinement status: Secondary | ICD-10-CM | POA: Diagnosis not present

## 2021-06-23 DIAGNOSIS — I6622 Occlusion and stenosis of left posterior cerebral artery: Secondary | ICD-10-CM | POA: Diagnosis not present

## 2021-06-23 DIAGNOSIS — Z833 Family history of diabetes mellitus: Secondary | ICD-10-CM

## 2021-06-23 DIAGNOSIS — E1165 Type 2 diabetes mellitus with hyperglycemia: Secondary | ICD-10-CM | POA: Diagnosis present

## 2021-06-23 DIAGNOSIS — R531 Weakness: Secondary | ICD-10-CM | POA: Diagnosis not present

## 2021-06-23 DIAGNOSIS — Z83511 Family history of glaucoma: Secondary | ICD-10-CM

## 2021-06-23 DIAGNOSIS — I6389 Other cerebral infarction: Secondary | ICD-10-CM | POA: Diagnosis not present

## 2021-06-23 DIAGNOSIS — Z794 Long term (current) use of insulin: Secondary | ICD-10-CM | POA: Diagnosis not present

## 2021-06-23 DIAGNOSIS — G459 Transient cerebral ischemic attack, unspecified: Secondary | ICD-10-CM | POA: Diagnosis not present

## 2021-06-23 DIAGNOSIS — I130 Hypertensive heart and chronic kidney disease with heart failure and stage 1 through stage 4 chronic kidney disease, or unspecified chronic kidney disease: Secondary | ICD-10-CM | POA: Diagnosis present

## 2021-06-23 DIAGNOSIS — Z8249 Family history of ischemic heart disease and other diseases of the circulatory system: Secondary | ICD-10-CM

## 2021-06-23 DIAGNOSIS — Z7189 Other specified counseling: Secondary | ICD-10-CM | POA: Diagnosis not present

## 2021-06-23 DIAGNOSIS — M6281 Muscle weakness (generalized): Secondary | ICD-10-CM | POA: Diagnosis not present

## 2021-06-23 DIAGNOSIS — I6613 Occlusion and stenosis of bilateral anterior cerebral arteries: Secondary | ICD-10-CM | POA: Diagnosis not present

## 2021-06-23 LAB — CBC
HCT: 42.2 % (ref 39.0–52.0)
Hemoglobin: 13.3 g/dL (ref 13.0–17.0)
MCH: 28.5 pg (ref 26.0–34.0)
MCHC: 31.5 g/dL (ref 30.0–36.0)
MCV: 90.6 fL (ref 80.0–100.0)
Platelets: 364 10*3/uL (ref 150–400)
RBC: 4.66 MIL/uL (ref 4.22–5.81)
RDW: 13.3 % (ref 11.5–15.5)
WBC: 5.7 10*3/uL (ref 4.0–10.5)
nRBC: 0 % (ref 0.0–0.2)

## 2021-06-23 LAB — COMPREHENSIVE METABOLIC PANEL
ALT: 16 U/L (ref 0–44)
AST: 19 U/L (ref 15–41)
Albumin: 3.1 g/dL — ABNORMAL LOW (ref 3.5–5.0)
Alkaline Phosphatase: 74 U/L (ref 38–126)
Anion gap: 10 (ref 5–15)
BUN: 21 mg/dL (ref 8–23)
CO2: 22 mmol/L (ref 22–32)
Calcium: 9.1 mg/dL (ref 8.9–10.3)
Chloride: 102 mmol/L (ref 98–111)
Creatinine, Ser: 1.91 mg/dL — ABNORMAL HIGH (ref 0.61–1.24)
GFR, Estimated: 35 mL/min — ABNORMAL LOW (ref >60)
Glucose, Bld: 288 mg/dL — ABNORMAL HIGH (ref 70–99)
Potassium: 4.7 mmol/L (ref 3.5–5.1)
Sodium: 134 mmol/L — ABNORMAL LOW (ref 135–145)
Total Bilirubin: 0.7 mg/dL (ref 0.3–1.2)
Total Protein: 6.7 g/dL (ref 6.5–8.1)

## 2021-06-23 LAB — DIFFERENTIAL
Abs Immature Granulocytes: 0.1 10*3/uL — ABNORMAL HIGH (ref 0.00–0.07)
Basophils Absolute: 0.1 10*3/uL (ref 0.0–0.1)
Basophils Relative: 1 %
Eosinophils Absolute: 0.1 10*3/uL (ref 0.0–0.5)
Eosinophils Relative: 1 %
Immature Granulocytes: 2 %
Lymphocytes Relative: 31 %
Lymphs Abs: 1.7 10*3/uL (ref 0.7–4.0)
Monocytes Absolute: 0.4 10*3/uL (ref 0.1–1.0)
Monocytes Relative: 8 %
Neutro Abs: 3.3 10*3/uL (ref 1.7–7.7)
Neutrophils Relative %: 57 %

## 2021-06-23 LAB — I-STAT CHEM 8, ED
BUN: 23 mg/dL (ref 8–23)
Calcium, Ion: 1.15 mmol/L (ref 1.15–1.40)
Chloride: 102 mmol/L (ref 98–111)
Creatinine, Ser: 1.6 mg/dL — ABNORMAL HIGH (ref 0.61–1.24)
Glucose, Bld: 288 mg/dL — ABNORMAL HIGH (ref 70–99)
HCT: 42 % (ref 39.0–52.0)
Hemoglobin: 14.3 g/dL (ref 13.0–17.0)
Potassium: 4.7 mmol/L (ref 3.5–5.1)
Sodium: 133 mmol/L — ABNORMAL LOW (ref 135–145)
TCO2: 21 mmol/L — ABNORMAL LOW (ref 22–32)

## 2021-06-23 LAB — CBG MONITORING, ED: Glucose-Capillary: 288 mg/dL — ABNORMAL HIGH (ref 70–99)

## 2021-06-23 LAB — PROTIME-INR
INR: 0.9 (ref 0.8–1.2)
Prothrombin Time: 12.5 seconds (ref 11.4–15.2)

## 2021-06-23 LAB — ETHANOL: Alcohol, Ethyl (B): <10 mg/dL (ref <10)

## 2021-06-23 LAB — RESP PANEL BY RT-PCR (FLU A&B, COVID) ARPGX2
Influenza A by PCR: NEGATIVE
Influenza B by PCR: NEGATIVE
SARS Coronavirus 2 by RT PCR: NEGATIVE

## 2021-06-23 LAB — APTT: aPTT: 29 seconds (ref 24–36)

## 2021-06-23 NOTE — Telephone Encounter (Signed)
Daughter has called in stating she believes patient might have had a stroke.   States patient has been taken to the hospital by ambulance for evaluation.  I have advised to give our office a call back upon discharge to get patient worked in with Dr. Jerline Pain.

## 2021-06-23 NOTE — Telephone Encounter (Signed)
Yes please have them schedule an appointment ASAP. I believe home health should be starting soon to assist.  [name]. Jerline Pain, MD 06/23/2021 9:22 AM

## 2021-06-23 NOTE — ED Notes (Signed)
Spouse is requesting updates

## 2021-06-23 NOTE — ED Provider Triage Note (Signed)
Emergency Medicine Provider Triage Evaluation Note  David Ford , a 81 y.o. male  was evaluated in triage.  Pt complains of weakness. Per EMS, patient's wife called EMS due to confusion and concern for left-sided weakness. LKW 10AM. During triage, patient admits to bilateral lower extremity edema. He notes he feels "weak all over". Denies visual and speech changes. Recent admission for hypoglycemia and AKI. Difficult to get history due to poor historian.   Review of Systems  Positive: Weakness, confusion Negative: CP  Physical Exam  BP (!) 137/55 (BP Location: Right Arm)    Pulse 69    Temp 98.4 F (36.9 C) (Oral)    Resp 18    SpO2 100%  Gen:   Awake, no distress   Resp:  Normal effort  MSK:   Moves extremities without difficulty  Other:  No facial droop, Normal speech, no pronator drift. Able to move all 4 extremities without difficulty.  Medical Decision Making  Medically screening exam initiated at 12:46 PM.  Appropriate orders placed.  David Ford was informed that the remainder of the evaluation will be completed by another provider, this initial triage assessment does not replace that evaluation, and the importance of remaining in the ED until their evaluation is complete.  Generalized weakness, no focal deficits appreciated on exam. Dr. Roslynn Amble evaluated patient at triage and agrees no focal deficits. Stroke labs ordered. Given no focal deficits, code stroke not initiated in triage. CBG without hypoglycemia in triage.    Suzy Bouchard, [place] 06/23/21 1251

## 2021-06-23 NOTE — Telephone Encounter (Signed)
Please advise 

## 2021-06-23 NOTE — ED Triage Notes (Signed)
Patient BIB [facility] from home with complaint of confusion and weakness. Patient initially only oriented to self and location. Slight weakness noted in left leg, no drift, no facial droop, alert and oriented x4, no arm drift, no ataxia. CBG 219 RR15 98% on room air

## 2021-06-24 ENCOUNTER — Emergency Department (HOSPITAL_COMMUNITY): Payer: Medicare HMO

## 2021-06-24 ENCOUNTER — Inpatient Hospital Stay (HOSPITAL_COMMUNITY): Payer: Medicare HMO

## 2021-06-24 ENCOUNTER — Inpatient Hospital Stay: Payer: Medicare HMO | Admitting: Family Medicine

## 2021-06-24 ENCOUNTER — Telehealth: Payer: Self-pay | Admitting: *Deleted

## 2021-06-24 DIAGNOSIS — N400 Enlarged prostate without lower urinary tract symptoms: Secondary | ICD-10-CM | POA: Diagnosis present

## 2021-06-24 DIAGNOSIS — R4701 Aphasia: Secondary | ICD-10-CM | POA: Diagnosis present

## 2021-06-24 DIAGNOSIS — N1832 Chronic kidney disease, stage 3b: Secondary | ICD-10-CM | POA: Diagnosis present

## 2021-06-24 DIAGNOSIS — E785 Hyperlipidemia, unspecified: Secondary | ICD-10-CM | POA: Diagnosis present

## 2021-06-24 DIAGNOSIS — R140 Abdominal distension (gaseous): Secondary | ICD-10-CM | POA: Diagnosis present

## 2021-06-24 DIAGNOSIS — N179 Acute kidney failure, unspecified: Secondary | ICD-10-CM | POA: Diagnosis present

## 2021-06-24 DIAGNOSIS — I152 Hypertension secondary to endocrine disorders: Secondary | ICD-10-CM

## 2021-06-24 DIAGNOSIS — I6389 Other cerebral infarction: Secondary | ICD-10-CM | POA: Diagnosis not present

## 2021-06-24 DIAGNOSIS — D631 Anemia in chronic kidney disease: Secondary | ICD-10-CM | POA: Diagnosis not present

## 2021-06-24 DIAGNOSIS — I5032 Chronic diastolic (congestive) heart failure: Secondary | ICD-10-CM | POA: Diagnosis present

## 2021-06-24 DIAGNOSIS — G9341 Metabolic encephalopathy: Secondary | ICD-10-CM | POA: Diagnosis present

## 2021-06-24 DIAGNOSIS — I129 Hypertensive chronic kidney disease with stage 1 through stage 4 chronic kidney disease, or unspecified chronic kidney disease: Secondary | ICD-10-CM | POA: Diagnosis not present

## 2021-06-24 DIAGNOSIS — I130 Hypertensive heart and chronic kidney disease with heart failure and stage 1 through stage 4 chronic kidney disease, or unspecified chronic kidney disease: Secondary | ICD-10-CM | POA: Diagnosis present

## 2021-06-24 DIAGNOSIS — N183 Chronic kidney disease, stage 3 unspecified: Secondary | ICD-10-CM

## 2021-06-24 DIAGNOSIS — I6601 Occlusion and stenosis of right middle cerebral artery: Secondary | ICD-10-CM | POA: Diagnosis not present

## 2021-06-24 DIAGNOSIS — I6381 Other cerebral infarction due to occlusion or stenosis of small artery: Principal | ICD-10-CM

## 2021-06-24 DIAGNOSIS — Z89422 Acquired absence of other left toe(s): Secondary | ICD-10-CM | POA: Diagnosis not present

## 2021-06-24 DIAGNOSIS — E1169 Type 2 diabetes mellitus with other specified complication: Secondary | ICD-10-CM | POA: Diagnosis present

## 2021-06-24 DIAGNOSIS — F05 Delirium due to known physiological condition: Secondary | ICD-10-CM | POA: Diagnosis present

## 2021-06-24 DIAGNOSIS — R569 Unspecified convulsions: Secondary | ICD-10-CM | POA: Diagnosis not present

## 2021-06-24 DIAGNOSIS — Z8546 Personal history of malignant neoplasm of prostate: Secondary | ICD-10-CM | POA: Diagnosis not present

## 2021-06-24 DIAGNOSIS — G8194 Hemiplegia, unspecified affecting left nondominant side: Secondary | ICD-10-CM | POA: Diagnosis present

## 2021-06-24 DIAGNOSIS — E1122 Type 2 diabetes mellitus with diabetic chronic kidney disease: Secondary | ICD-10-CM | POA: Diagnosis present

## 2021-06-24 DIAGNOSIS — F0390 Unspecified dementia without behavioral disturbance: Secondary | ICD-10-CM | POA: Diagnosis present

## 2021-06-24 DIAGNOSIS — Z20822 Contact with and (suspected) exposure to covid-19: Secondary | ICD-10-CM | POA: Diagnosis present

## 2021-06-24 DIAGNOSIS — G319 Degenerative disease of nervous system, unspecified: Secondary | ICD-10-CM | POA: Diagnosis not present

## 2021-06-24 DIAGNOSIS — Z8673 Personal history of transient ischemic attack (TIA), and cerebral infarction without residual deficits: Secondary | ICD-10-CM | POA: Diagnosis not present

## 2021-06-24 DIAGNOSIS — R29708 NIHSS score 8: Secondary | ICD-10-CM | POA: Diagnosis present

## 2021-06-24 DIAGNOSIS — E1165 Type 2 diabetes mellitus with hyperglycemia: Secondary | ICD-10-CM | POA: Diagnosis present

## 2021-06-24 DIAGNOSIS — I63522 Cerebral infarction due to unspecified occlusion or stenosis of left anterior cerebral artery: Secondary | ICD-10-CM | POA: Diagnosis not present

## 2021-06-24 DIAGNOSIS — E1159 Type 2 diabetes mellitus with other circulatory complications: Secondary | ICD-10-CM

## 2021-06-24 DIAGNOSIS — H409 Unspecified glaucoma: Secondary | ICD-10-CM

## 2021-06-24 DIAGNOSIS — I6622 Occlusion and stenosis of left posterior cerebral artery: Secondary | ICD-10-CM | POA: Diagnosis not present

## 2021-06-24 DIAGNOSIS — I639 Cerebral infarction, unspecified: Secondary | ICD-10-CM | POA: Diagnosis not present

## 2021-06-24 DIAGNOSIS — I6613 Occlusion and stenosis of bilateral anterior cerebral arteries: Secondary | ICD-10-CM | POA: Diagnosis not present

## 2021-06-24 DIAGNOSIS — Z89412 Acquired absence of left great toe: Secondary | ICD-10-CM | POA: Diagnosis not present

## 2021-06-24 LAB — CBC WITH DIFFERENTIAL/PLATELET
Abs Immature Granulocytes: 0.06 10*3/uL (ref 0.00–0.07)
Basophils Absolute: 0.1 10*3/uL (ref 0.0–0.1)
Basophils Relative: 1 %
Eosinophils Absolute: 0.1 10*3/uL (ref 0.0–0.5)
Eosinophils Relative: 1 %
HCT: 39 % (ref 39.0–52.0)
Hemoglobin: 13 g/dL (ref 13.0–17.0)
Immature Granulocytes: 1 %
Lymphocytes Relative: 34 %
Lymphs Abs: 2.4 10*3/uL (ref 0.7–4.0)
MCH: 29.1 pg (ref 26.0–34.0)
MCHC: 33.3 g/dL (ref 30.0–36.0)
MCV: 87.2 fL (ref 80.0–100.0)
Monocytes Absolute: 0.8 10*3/uL (ref 0.1–1.0)
Monocytes Relative: 11 %
Neutro Abs: 3.6 10*3/uL (ref 1.7–7.7)
Neutrophils Relative %: 52 %
Platelets: 360 10*3/uL (ref 150–400)
RBC: 4.47 MIL/uL (ref 4.22–5.81)
RDW: 13.2 % (ref 11.5–15.5)
WBC: 6.9 10*3/uL (ref 4.0–10.5)
nRBC: 0 % (ref 0.0–0.2)

## 2021-06-24 LAB — CBG MONITORING, ED
Glucose-Capillary: 239 mg/dL — ABNORMAL HIGH (ref 70–99)
Glucose-Capillary: 263 mg/dL — ABNORMAL HIGH (ref 70–99)
Glucose-Capillary: 306 mg/dL — ABNORMAL HIGH (ref 70–99)

## 2021-06-24 MED ORDER — SODIUM CHLORIDE 0.9 % IV BOLUS
500.0000 mL | Freq: Once | INTRAVENOUS | Status: AC
  Administered 2021-06-24: 500 mL via INTRAVENOUS

## 2021-06-24 MED ORDER — BRIMONIDINE TARTRATE 0.2 % OP SOLN
1.0000 [drp] | Freq: Two times a day (BID) | OPHTHALMIC | Status: DC
  Administered 2021-06-25 – 2021-07-01 (×14): 1 [drp] via OPHTHALMIC
  Filled 2021-06-24: qty 5

## 2021-06-24 MED ORDER — TRAZODONE HCL 50 MG PO TABS
150.0000 mg | ORAL_TABLET | Freq: Every evening | ORAL | Status: DC | PRN
  Administered 2021-06-24 – 2021-06-28 (×2): 150 mg via ORAL
  Filled 2021-06-24: qty 3
  Filled 2021-06-24: qty 1

## 2021-06-24 MED ORDER — STROKE: EARLY STAGES OF RECOVERY BOOK
Freq: Once | Status: DC
  Filled 2021-06-24: qty 1

## 2021-06-24 MED ORDER — PANTOPRAZOLE SODIUM 40 MG PO TBEC
40.0000 mg | DELAYED_RELEASE_TABLET | Freq: Every day | ORAL | Status: DC
  Administered 2021-06-24 – 2021-07-01 (×8): 40 mg via ORAL
  Filled 2021-06-24 (×9): qty 1

## 2021-06-24 MED ORDER — ACETAMINOPHEN 650 MG RE SUPP: 650.0000 mg | RECTAL | Status: DC | PRN

## 2021-06-24 MED ORDER — ASPIRIN EC 81 MG PO TBEC
81.0000 mg | DELAYED_RELEASE_TABLET | Freq: Every day | ORAL | Status: DC
  Administered 2021-06-24 – 2021-06-26 (×3): 81 mg via ORAL
  Filled 2021-06-24 (×3): qty 1

## 2021-06-24 MED ORDER — ATORVASTATIN CALCIUM 10 MG PO TABS
10.0000 mg | ORAL_TABLET | Freq: Every day | ORAL | Status: DC
  Administered 2021-06-24: 10 mg via ORAL
  Filled 2021-06-24: qty 1

## 2021-06-24 MED ORDER — LATANOPROST 0.005 % OP SOLN
1.0000 [drp] | Freq: Every day | OPHTHALMIC | Status: DC
  Administered 2021-06-25 – 2021-06-30 (×7): 1 [drp] via OPHTHALMIC
  Filled 2021-06-24: qty 2.5

## 2021-06-24 MED ORDER — SODIUM CHLORIDE 0.9 % IV SOLN
INTRAVENOUS | Status: DC
  Administered 2021-06-25: 75 mL via INTRAVENOUS

## 2021-06-24 MED ORDER — ACETAMINOPHEN 325 MG PO TABS: 650.0000 mg | ORAL_TABLET | ORAL | Status: DC | PRN

## 2021-06-24 MED ORDER — GADOBUTROL 1 MMOL/ML IV SOLN
8.0000 mL | Freq: Once | INTRAVENOUS | Status: AC | PRN
  Administered 2021-06-24: 8 mL via INTRAVENOUS

## 2021-06-24 MED ORDER — TAMSULOSIN HCL 0.4 MG PO CAPS
0.4000 mg | ORAL_CAPSULE | Freq: Every day | ORAL | Status: DC
  Administered 2021-06-24 – 2021-07-01 (×8): 0.4 mg via ORAL
  Filled 2021-06-24 (×8): qty 1

## 2021-06-24 MED ORDER — INSULIN ASPART 100 UNIT/ML IJ SOLN
0.0000 [IU] | INTRAMUSCULAR | Status: DC
  Administered 2021-06-24: 3 [IU] via SUBCUTANEOUS
  Administered 2021-06-25: 2 [IU] via SUBCUTANEOUS
  Administered 2021-06-25: 5 [IU] via SUBCUTANEOUS
  Administered 2021-06-25 – 2021-06-26 (×4): 2 [IU] via SUBCUTANEOUS
  Administered 2021-06-26: 5 [IU] via SUBCUTANEOUS
  Administered 2021-06-26 (×2): 2 [IU] via SUBCUTANEOUS
  Administered 2021-06-26: 3 [IU] via SUBCUTANEOUS
  Administered 2021-06-27 (×2): 5 [IU] via SUBCUTANEOUS
  Administered 2021-06-27 (×2): 3 [IU] via SUBCUTANEOUS
  Administered 2021-06-28: 5 [IU] via SUBCUTANEOUS
  Administered 2021-06-28: 2 [IU] via SUBCUTANEOUS
  Administered 2021-06-28: 5 [IU] via SUBCUTANEOUS
  Administered 2021-06-28: 2 [IU] via SUBCUTANEOUS
  Administered 2021-06-28: 3 [IU] via SUBCUTANEOUS
  Administered 2021-06-28 (×2): 5 [IU] via SUBCUTANEOUS
  Administered 2021-06-29 (×2): 3 [IU] via SUBCUTANEOUS
  Administered 2021-06-29 (×2): 7 [IU] via SUBCUTANEOUS
  Administered 2021-06-30: 2 [IU] via SUBCUTANEOUS
  Administered 2021-06-30: 3 [IU] via SUBCUTANEOUS
  Administered 2021-06-30: 7 [IU] via SUBCUTANEOUS
  Administered 2021-06-30 (×2): 2 [IU] via SUBCUTANEOUS
  Administered 2021-07-01: 1 [IU] via SUBCUTANEOUS
  Administered 2021-07-01: 5 [IU] via SUBCUTANEOUS

## 2021-06-24 MED ORDER — DORZOLAMIDE HCL-TIMOLOL MAL 2-0.5 % OP SOLN
1.0000 [drp] | Freq: Two times a day (BID) | OPHTHALMIC | Status: DC
  Administered 2021-06-25 – 2021-07-01 (×14): 1 [drp] via OPHTHALMIC
  Filled 2021-06-24: qty 10

## 2021-06-24 MED ORDER — ACETAMINOPHEN 160 MG/5ML PO SOLN: 650.0000 mg | ORAL | Status: DC | PRN

## 2021-06-24 NOTE — Assessment & Plan Note (Signed)
Monitor for any sundowning 

## 2021-06-24 NOTE — Consult Note (Addendum)
NEUROLOGY CONSULTATION NOTE   Date of service: June 24, 2021 Patient Name: David Ford MRN:  [id number] DOB:  01/07/1941 Reason for consult: "confusion and a seizure" Requesting Provider: Toy Baker, MD _ _ _   _ __   _ __ _ _  __ __   _ __   __ _  History of Present Illness  David Ford is a 81 y.o. male with PMH significant for diabetes, hypertension, hyperlipidemia, CKD stage III, history of prostate cancer and cognitive impairment who presents with confusion and inability to perform ADLs.  Patient does not know why he is in the hospital and is disoriented.  I spoke with patient's wife over the phone and she reports that patient has been in and out of the hospital the last few weeks and they have had to call EMS multiple times over the last couple weeks.  She reports that he was seen in the hospital twice in December for low glucose and was sent home and she is noted that he is just not been himself since then.  He has been requiring a lot of help with ADLs, he has become incontinent, he needs to be based and need a lot of assistance with dressing and dressing himself.  He is unable to hold a conversation and easily gets agitated.  Patient's wife reports that yesterday he was at a table and they were having conversation in the middle of conversation he became agitated and kept repeating"I can't, I can't", then held onto the table really hard and then went down on the floor and appeared to have some jerking and was breathing hard with eyes rolled into the back of his head.  She feels like the left side of his body appeared little weak afterwards.  She is not entirely sure if he was weak though.  She called EMS and the patient was evaluated by EMS but not brought to the hospital.  No prior history of seizures.  She brought into the hospital today for wrosening confusion.  Here he had MRI brain without contrast which demonstrated a punctate small acute left frontal lobe white matter  infarct.   ROS   Constitutional Denies weight loss, fever and chills.   HEENT Denies changes in vision and hearing.   Respiratory Denies SOB and cough.   CV Denies palpitations and CP   GI Denies abdominal pain, nausea, vomiting and diarrhea.   GU Denies dysuria and urinary frequency.   MSK Denies myalgia and joint pain.   Skin Denies rash and pruritus.   Neurological Denies headache and syncope.   Psychiatric Denies recent changes in mood. Denies anxiety and depression.    Past History   Past Medical History:  Diagnosis Date   AKI (acute kidney injury) ([place]) 06/18/2015   Cancer ([place])    hx of prostate cancer   Chronic kidney disease    Diabetes mellitus without complication ([place])    DKA (diabetic ketoacidoses) 06/18/2015   Glaucoma    Hyperlipidemia    Hypertension    Past Surgical History:  Procedure Laterality Date   AMPUTATION Left 07/03/2015   Procedure: Left Foot 1st and 2nd Ray Amputation;  Surgeon: Newt Minion, MD;  Location: [facility];  Service: Orthopedics;  Laterality: Left;   BASCILIC VEIN TRANSPOSITION Left 07/14/2015   Procedure: BRACHIOBASILIC VEIN TRANSPOSITION  ;  Surgeon: Elam Dutch, MD;  Location: [facility] OR;  Service: Vascular;  Laterality: Left;   IRIDOTOMY / IRIDECTOMY     prostate  seeds     Family History  Problem Relation Age of Onset   Diabetes Mother    Hypertension Mother    Stroke Mother    Diabetes Sister    Glaucoma Sister    Diabetes Brother    Glaucoma Brother    Amblyopia Neg Hx    Blindness Neg Hx    Cataracts Neg Hx    Macular degeneration Neg Hx    Retinal detachment Neg Hx    Strabismus Neg Hx    Retinitis pigmentosa Neg Hx    Social History   Socioeconomic History   Marital status: Divorced    Spouse name: Not on file   Number of children: Not on file   Years of education: Not on file   Highest education level: Not on file  Occupational History   Not on file  Tobacco Use   Smoking  status: Never   Smokeless tobacco: Never  Vaping Use   Vaping Use: Never used  Substance and Sexual Activity   Alcohol use: Yes    Comment: once a week   Drug use: No   Sexual activity: Not on file  Other Topics Concern   Not on file  Social History Narrative   Not on file   Social Determinants of Health   Financial Resource Strain: Not on file  Food Insecurity: Not on file  Transportation Needs: Not on file  Physical Activity: Not on file  Stress: Not on file  Social Connections: Not on file   No Known Allergies  Medications  (Not in a hospital admission)    Vitals   Vitals:   06/24/21 1614 06/24/21 1636 06/24/21 1637 06/24/21 1909  BP: (!) 162/87   (!) 160/82  Pulse: 100   98  Resp: 20   20  Temp: 98 F (36.7 C)     TempSrc: Oral     SpO2: 100% 100% 100% 100%     There is no height or weight on file to calculate BMI.  Physical Exam   General: Laying comfortably in bed; in no acute distress.  HENT: Normal oropharynx and mucosa. Normal external appearance of ears and nose.  Neck: Supple, no pain or tenderness  CV: No JVD. No peripheral edema.  Pulmonary: Symmetric Chest rise. Normal respiratory effort.  Abdomen: Soft to touch, non-tender.  Ext: No cyanosis, edema, or deformity  Skin: No rash. Normal palpation of skin.   Musculoskeletal: Normal digits and nails by inspection. No clubbing.   Neurologic Examination  Mental status/Cognition: Alert, oriented to self only, poor attention.  Speech/language: Fluent, comprehension intact, object naming intact, repetition intact.  Cranial nerves:   CN II Pupils equal and reactive to light, no VF deficits    CN III,IV,VI EOM intact, no gaze preference or deviation, no nystagmus   CN V normal sensation in V1, V2, and V3 segments bilaterally   CN VII no asymmetry, no nasolabial fold flattening   CN VIII normal hearing to speech   CN IX & X normal palatal elevation, no uvular deviation    CN XI 5/5 head turn  and 5/5 shoulder shrug bilaterally    CN XII midline tongue protrusion    Motor:  Muscle bulk: normal, tone paratonia, pronator drift none tremor none Mvmt Root Nerve  Muscle Right Left Comments  SA C5/6 Ax Deltoid     EF C5/6 Mc Biceps 5 5   EE C6/7/8 Rad Triceps 5 5   WF C6/7 Med FCR  WE C7/8 PIN ECU     F Ab C8/T1 U ADM/FDI 5 5   HF L1/2/3 Fem Illopsoas 4+ 4+   KE L2/3/4 Fem Quad 5 5   DF L4/5 D Peron Tib Ant 5 5   PF S1/2 Tibial Grc/Sol 5 3 Pain on left planter flexion limiting eval   Reflexes:  Right Left Comments  Pectoralis      Biceps (C5/6) 2 2   Brachioradialis (C5/6) 2 2    Triceps (C6/7) 2 2    Patellar (L3/4) 2 2    Achilles (S1)      Hoffman      Plantar     Jaw jerk    Sensation:  Light touch Intact throughout   Pin prick    Temperature    Vibration   Proprioception    Coordination/Complex Motor:  - Finger to Nose intact bilaterally - Heel to shin difficult for him to do but intact bilaterally - Rapid alternating movement are slowed - Gait: Deferred for patient safety. Labs   CBC:  Recent Labs  Lab 06/23/21 1243 06/23/21 1308 06/24/21 2325  WBC 5.7  --  6.9  NEUTROABS 3.3  --  3.6  HGB 13.3 14.3 13.0  HCT 42.2 42.0 39.0  MCV 90.6  --  87.2  PLT 364  --  322    Basic Metabolic Panel:  Lab Results  Component Value Date   NA 133 (L) 06/23/2021   K 4.7 06/23/2021   CO2 22 06/23/2021   GLUCOSE 288 (H) 06/23/2021   BUN 23 06/23/2021   CREATININE 1.60 (H) 06/23/2021   CALCIUM 9.1 06/23/2021   GFRNONAA 35 (L) 06/23/2021   GFRAA 34 (L) 01/08/2020   Lipid Panel:  Lab Results  Component Value Date   LDLCALC 137 (H) 08/20/2018   HgbA1c:  Lab Results  Component Value Date   HGBA1C 7.1 (A) 06/08/2021   Urine Drug Screen:     Component Value Date/Time   LABOPIA NONE DETECTED 06/18/2015 0117   COCAINSCRNUR NONE DETECTED 06/18/2015 0117   LABBENZ NONE DETECTED 06/18/2015 0117   AMPHETMU NONE DETECTED 06/18/2015 0117   THCU NONE  DETECTED 06/18/2015 0117   LABBARB NONE DETECTED 06/18/2015 0117    Alcohol Level     Component Value Date/Time   ETH <10 06/23/2021 1243    CT Head without contrast(Personally reviewed): CTH was negative for a large hypodensity concerning for a large territory infarct or hyperdensity concerning for an ICH  MR Angio head without contrast and Carotid Duplex BL: pending  MRI Brain(Personally reviewed): Punctate acute infarct in left frontal lobe white matter.  rEEG:  pending  Impression   David Ford is a 81 y.o. male with PMH significant for diabetes, hypertension, hyperlipidemia, CKD stage III, history of prostate cancer and cognitive impairment who presents with confusion and inability to perform ADLs. He had a possible first time seizure yesterday althou not entirely clear from the description provided by wife if this was truly a seizure.  I think the small punctate left frontal lobe white matter stroke is small vessel disease and is incidental and does not explain his presentation.  I suspect he has underlying cognitive impairment and possibly dementia but is going to be difficult to diagnose in the hospital. His neurologic examination is notable for executive dysfunction, disorientation but no focal deficit.  Primary Diagnosis:  Other cerebral infarction due to occlusion of stenosis of small artery.  Secondary Diagnosis: Essential (primary) hypertension, Type 2 diabetes mellitus  with hyperglycemia , and CKD Stage 3 (GFR 30-59)  Recommendations  Plan:  - Frequent Neuro checks per stroke unit protocol - Recommend Vascular imaging with MRA Angio Head without contrast and US Carotid doppler - Recommend obtaining TTE - Recommend obtaining Lipid panel with LDL - Please start statin if LDL > 70 - Recommend HbA1c - Antithrombotic - aspirin 81mg  daily. - Recommend DVT ppx - SBP goal - permissive hypertension first 24 h < 220/110. Held home meds.  - Recommend Telemetry  monitoring for arrythmia - Recommend bedside swallow screen prior to PO intake. - Stroke education booklet - Recommend PT/OT/SLP consult - Recommend routine EEG in addition to the standard stroke workup.  Plan discussed with Dr. Roel Cluck ______________________________________________________________________   Thank you for the opportunity to take part in the care of this patient. If you have any further questions, please contact the neurology consultation attending.  Signed,  [place] Pager Number [phone] _ _ _   _ __   _ __ _ _  __ __   _ __   __ _

## 2021-06-24 NOTE — Assessment & Plan Note (Signed)
-   Continue home meds °

## 2021-06-24 NOTE — Assessment & Plan Note (Signed)
-   currently appears to be slightly on the dry side, and restart when appears euvolemic, carefuly follow fluid status and Cr

## 2021-06-24 NOTE — Assessment & Plan Note (Signed)
-   Order Sensitive   SSI   - hold lantus  -  check TSH and HgA1C

## 2021-06-24 NOTE — ED Notes (Signed)
Patient alert and oriented, still awaiting treatment room

## 2021-06-24 NOTE — ED Provider Notes (Addendum)
Signout note  81 year old gentleman presenting to ER with concern for weakness, confusion.  Family also reported possible seizure-like activity.  Case was reviewed with Dr. Quinn Axe on-call neurology who recommended getting MRI.  If negative, plan to discharge patient.  3:30 PM received signout from Dr. Tamera Punt  7:51 PM reviewed MRI findings, punctate acute infarct in the left frontal lobe, reassessed patient, discussed findings with family at bedside, will discuss again with neurology, anticipate admission to medicine for stroke work-up.  Patient is alert, no acute distress, vital signs grossly stable, mild hypertension  9:03 PM discussed with Dr. Milas Gain with neurology, he suspects the MRI finding may be incidental but would recommend admission for stroke work-up nevertheless, also will likely get EEG for patient given the possible seizure-like activity.  Paged [facility] for admit.  CRITICAL CARE Performed by: Lucrezia Starch   Total critical care time: 34 minutes  Critical care time was exclusive of separately billable procedures and treating other patients.  Critical care was necessary to treat or prevent imminent or life-threatening deterioration.  Critical care was time spent personally by me on the following activities: development of treatment plan with patient and/or surrogate as well as nursing, discussions with consultants, evaluation of patient's response to treatment, examination of patient, obtaining history from patient or surrogate, ordering and performing treatments and interventions, ordering and review of laboratory studies, ordering and review of radiographic studies, pulse oximetry and re-evaluation of patient's condition.    Lucrezia Starch, MD 06/24/21 [place]    Lucrezia Starch, MD 06/24/21 2103

## 2021-06-24 NOTE — Telephone Encounter (Signed)
Called back with information regarding Urology location:  [facility]  [address], [place] [address]

## 2021-06-24 NOTE — Telephone Encounter (Signed)
Home Health nurse call requesting VO for  Social worker for [employer] information  Please advise

## 2021-06-24 NOTE — Subjective & Objective (Signed)
Has been having increased confusion and yesterday had Seizure like activity

## 2021-06-24 NOTE — Assessment & Plan Note (Signed)
Permissive HTN

## 2021-06-24 NOTE — ED Provider Notes (Signed)
[facility] EMERGENCY DEPARTMENT Provider Note   CSN: [id number] Arrival date & time: 06/23/21  1221     History  Chief Complaint  Patient presents with   Weakness    Jaydon Avina is a 81 y.o. male.  Patient is a 81 year old male who presents with weakness.  Per the triage note, patient presented with some confusion and generalized weakness and potentially some slight weakness in the left leg.  I reviewed the EMS report and this reveals the same information.  I spoke with the patient's wife and she said that yesterday he was eating lunch at the table and had an episode where he suddenly became unresponsive and fell onto the floor.  He had some generalized jerking/shaking all over.  The patient's wife says this lasted about 5 minutes.  He was confused for a time.  After this.  EMS had noted that he had some slight weakness in his left leg.  He has no known history of seizures per his wife.  He has had a general decline over the last several months.  He has had some worsening confusion and not taking care of himself well.  He has to be reminded to take his medications and at times has had incontinence.  She has an upcoming appointment with neurology but has not had this appointment yet.  She is also talked to the patient's PCP about getting home health care and possibly an assisted living facility.  Per chart review, patient has a history of diabetes, hypertension, hyperlipidemia and cognitive disorder.      Home Medications Prior to Admission medications   Medication Sig Start Date End Date Taking? Authorizing Provider  amLODipine (NORVASC) 10 MG tablet Take 1 tablet (10 mg total) by mouth daily. 06/18/21 09/16/21  Kayleen Memos, DO  atorvastatin (LIPITOR) 10 MG tablet Take 1 tablet (10 mg total) by mouth daily. 10/01/20   [name], MD  Azelastine HCl 137 MCG/SPRAY SOLN USE 2 SPRAYS INTO BOTH NOSTRILS 2 (TWO) TIMES DAILY. Patient taking differently: Place 2 sprays  into both nostrils 2 (two) times daily. 05/18/20   [name], MD  Blood Glucose Monitoring Suppl (TRUE METRIX METER) w/Device KIT USE AS DIRECTED 02/10/20   [name], MD  brimonidine [facility]) 0.2 % ophthalmic solution Place 1 drop into both eyes in the morning and at bedtime. Original SIG was 1 gtt ou tid  But pt states only taking 1 gtt ou bid 06/01/18   [provider]  carvedilol (COREG) 3.125 MG tablet Take 1 tablet (3.125 mg total) by mouth 2 (two) times daily with a meal. 10/01/20   [name], MD  Continuous Blood Gluc Receiver (FREESTYLE LIBRE 2 READER) DEVI USe 4 times daily to check blood dsugar 06/08/21   [name], MD  Continuous Blood Gluc Sensor (FREESTYLE LIBRE 2 SENSOR) MISC Use 4 times daily to check blood sugar. 06/08/21   [name], MD  dorzolamide-timolol (COSOPT) 22.3-6.8 MG/ML ophthalmic solution Place 1 drop into both eyes 2 (two) times daily.  04/02/19   [provider]  fluticasone (FLONASE) 50 MCG/ACT nasal spray Place 2 sprays into both nostrils daily. 07/24/18   [name], MD  insulin aspart (NOVOLOG FLEXPEN) 100 UNIT/ML FlexPen Inject 2 Units into the skin 3 (three) times daily with meals. 06/18/21   Kayleen Memos, DO  insulin glargine (LANTUS SOLOSTAR) 100 UNIT/ML Solostar Pen Inject 5 Units into the skin daily. 06/18/21  Irene Pap N, DO  Insulin Pen Needle (PEN NEEDLES) 32G X 5 MM MISC 1 application by Does not apply route 4 (four) times daily as needed (to inject insulin). 08/03/17   [name], MD  latanoprost (XALATAN) 0.005 % ophthalmic solution INSTILL 1 DROP INTO BOTH EYES AT BEDTIME Patient taking differently: Place 1 drop into both eyes at bedtime. 03/29/18   [name], MD  Multiple Vitamin (MULTIVITAMIN WITH MINERALS) TABS tablet Take 1 tablet by mouth daily. 06/18/21 09/16/21  Kayleen Memos, DO  pantoprazole (PROTONIX) 40 MG tablet Take 1 tablet (40 mg total) by mouth 2 (two) times  daily. Patient taking differently: Take 40 mg by mouth daily. 10/01/20   [name], MD  polyethylene glycol (MIRALAX) 17 g packet Take 17 g by mouth daily as needed for mild constipation. 06/18/21   Kayleen Memos, DO  tamsulosin (FLOMAX) 0.4 MG CAPS capsule Take 1 capsule (0.4 mg total) by mouth daily. 10/01/20   [name], MD  traZODone (DESYREL) 150 MG tablet TAKE 2 TABLETS AT BEDTIME Patient taking differently: Take 150 mg by mouth at bedtime as needed for sleep. 01/29/21   [name], MD  triamcinolone ointment (KENALOG) 0.5 % APPLY TOPICALLY 2 TIMES DAILY Patient taking differently: Apply 1 application topically 2 (two) times daily. 02/15/21   [name], MD  TRUE METRIX BLOOD GLUCOSE TEST test strip TEST BLOOD SUGAR FOUR TIMES DAILY AND AS NEEDED 07/28/20   [name], MD  TRUEplus Lancets 33G MISC CHECK BLOOD SUGAR FOUR TIMES DAILY  AND AS NEEDED Patient taking differently: 1 each by Other route See admin instructions. CHECK BLOOD SUGAR FOUR TIMES DAILY  AND AS NEEDED 10/04/19   [name], MD  Vitamin D3 (VITAMIN D) 25 MCG tablet Take 1,000 Units by mouth daily.    [provider]      Allergies    Patient has no known allergies.    Review of Systems   Review of Systems  Unable to perform ROS: Mental status change   Physical Exam Updated Vital Signs BP (!) 162/76 (BP Location: Left Arm)    Pulse 72    Temp 98.9 F (37.2 C) (Oral)    Resp 17    SpO2 100%  Physical Exam Constitutional:      Appearance: He is well-developed.  HENT:     Head: Normocephalic and atraumatic.  Eyes:     Pupils: Pupils are equal, round, and reactive to light.  Cardiovascular:     Rate and Rhythm: Normal rate and regular rhythm.     Heart sounds: Normal heart sounds.  Pulmonary:     Effort: Pulmonary effort is normal. No respiratory distress.     Breath sounds: Normal breath sounds. No wheezing or rales.  Chest:     Chest wall: No tenderness.  Abdominal:      General: Bowel sounds are normal.     Palpations: Abdomen is soft.     Tenderness: There is no abdominal tenderness. There is no guarding or rebound.  Musculoskeletal:        General: Normal range of motion.     Cervical back: Normal range of motion and neck supple.  Lymphadenopathy:     Cervical: No cervical adenopathy.  Skin:    General: Skin is warm and dry.     Findings: No rash.  Neurological:     General: No focal deficit present.     Mental Status:  He is alert.     Comments: Patient is oriented to person place and year although he does not always answer questions accurately and goes off on tangents.  He is moving all extremities symmetrically.  Motor 5 out of 5 all extremities, sensation grossly intact to light touch all extremities, no pronator drift, finger-nose intact, cranial nerves II through XII grossly intact    ED Results / Procedures / Treatments   Labs (all labs ordered are listed, but only abnormal results are displayed) Labs Reviewed  DIFFERENTIAL - Abnormal; Notable for the following components:      Result Value   Abs Immature Granulocytes 0.10 (*)    All other components within normal limits  COMPREHENSIVE METABOLIC PANEL - Abnormal; Notable for the following components:   Sodium 134 (*)    Glucose, Bld 288 (*)    Creatinine, Ser 1.91 (*)    Albumin 3.1 (*)    GFR, Estimated 35 (*)    All other components within normal limits  I-STAT CHEM 8, ED - Abnormal; Notable for the following components:   Sodium 133 (*)    Creatinine, Ser 1.60 (*)    Glucose, Bld 288 (*)    TCO2 21 (*)    All other components within normal limits  CBG MONITORING, ED - Abnormal; Notable for the following components:   Glucose-Capillary 288 (*)    All other components within normal limits  CBG MONITORING, ED - Abnormal; Notable for the following components:   Glucose-Capillary 306 (*)    All other components within normal limits  CBG MONITORING, ED - Abnormal; Notable for the  following components:   Glucose-Capillary 263 (*)    All other components within normal limits  RESP PANEL BY RT-PCR (FLU A&B, COVID) ARPGX2  ETHANOL  PROTIME-INR  APTT  CBC  RAPID URINE DRUG SCREEN, HOSP PERFORMED  URINALYSIS, ROUTINE W REFLEX MICROSCOPIC  I-STAT CHEM 8, ED    EKG EKG Interpretation  Date/Time:  Wednesday June 23 2021 18:27:55 EST Ventricular Rate:  67 PR Interval:  182 QRS Duration: 134 QT Interval:  440 QTC Calculation: 464 R Axis:   -26 Text Interpretation: Normal sinus rhythm Left bundle branch block Abnormal ECG When compared with ECG of 15-Jun-2021 17:38, PREVIOUS ECG IS PRESENT since last tracing no significant change Confirmed by Malvin Johns [phone]) on 06/24/2021 11:59:49 AM  Radiology CT Head Wo Contrast  Result Date: 06/23/2021 CLINICAL DATA:  Mental status change, unknown cause. Generalized weakness. EXAM: CT HEAD WITHOUT CONTRAST TECHNIQUE: Contiguous axial images were obtained from the base of the skull through the vertex without intravenous contrast. COMPARISON:  01/05/2020 FINDINGS: Brain: Stable cerebral atrophy. No evidence for acute hemorrhage, mass lesion, midline shift, hydrocephalus or large infarct. Stable low-density in the periventricular and subcortical white matter is suggestive for chronic changes. Stable low-density area in the left basal ganglia could represent an old lacune infarct. Vascular: Calcification involving the right vertebral artery. Skull: Normal. Negative for fracture or focal lesion. Sinuses/Orbits: Possible polyp or retention cyst in the right maxillary sinus but incompletely imaged. Mild disease in left maxillary sinus. Other: None. IMPRESSION: 1. No acute intracranial abnormality. 2. Stable atrophy and evidence for chronic small vessel ischemic changes. Electronically Signed   By: Markus Daft M.D.   On: 06/23/2021 13:17    Procedures Procedures    Medications Ordered in ED Medications  sodium chloride 0.9 % bolus  500 mL (0 mLs Intravenous Stopped 06/24/21 1505)    ED Course/ Medical Decision  Making/ A&P                           Medical Decision Making  Patient is a 81 year old male who presents after he had some seizure-like activity yesterday.  He appears to be neurologically intact currently.  There was some question of some left-sided weakness but I do not appreciate this on exam.  His labs are nonconcerning.  His creatinine is elevated but similar to prior values based on my chart review.  His glucose is 288.  He had a head CT which showed no acute abnormality.  I discussed this patient with the on-call neurologist, Dr. Quinn Axe.  She recommends an MRI with and without contrast.  If this is negative for acute abnormality, patient can be discharged home with outpatient follow-up with neurology.  He will not need to be started on antiepileptics given this first-time seizure.  Care turned over to Dr. Roslynn Amble pending this.  Final Clinical Impression(s) / ED Diagnoses Final diagnoses:  Seizure-like activity [facility])    Rx / DC Orders ED Discharge Orders     None         Malvin Johns, MD 06/24/21 1540

## 2021-06-24 NOTE — Progress Notes (Signed)
°   06/24/21 1933  TOC ED Mini Assessment  TOC Time spent with patient (minutes): 45  PING Used in TOC Assessment No  Admission or Readmission Diverted Yes  Interventions which prevented an admission or readmission Other (must enter comment) (assisted living resources)  What brought you to the Emergency Department?  weakness  Barriers to Discharge No Barriers Identified  Barrier interventions CSW met with Pt and spouse at bedside.  Pt is oriented to self and time. Per spouse family is seeking assisted living placement for Pt due to increasing care needs. CSW explained that assisted living placement is not done from ED and if Pt has no rehabable SNF needs, Pt would be discharged home. CSW did write FL2 to assist family in their search for assisted living placement as well as provided spouse with in-home care resource list.  Means of departure Car

## 2021-06-24 NOTE — Assessment & Plan Note (Addendum)
-   will admit based on TIA/CVA protocol,         Monitor on Tele        MRI  Resulted - showing acute ischemic CVA  frontal        Carotid Doppler will order        Echo to evaluate for possible embolic source,        obtain cardiac enzymes,  ECG,   Lipid panel, TSH.        Order PT/OT evaluation.       keep nothing by mouth until passes swallow eval       Will make sure patient is on antiplatelet ASA  81 mg and statin        Allow permissive Hypertension keep BP <220/120        Neurology consulted will see  pt in ER

## 2021-06-24 NOTE — Telephone Encounter (Signed)
Ok with me. Please place any necessary orders. 

## 2021-06-24 NOTE — H&P (Signed)
David Ford:[id number] DOB: [birth year] DOA: 06/23/2021     PCP: [name], MD        Patient arrived to ER on 06/23/21 at 1221 Referred by Attending Toy Baker, MD   Patient coming from: home Lives With family    Chief Complaint:   Chief Complaint  Patient presents with   Weakness    HPI: David Ford is a 81 y.o. male with medical history significant of DM2, HTN, dementia, prostate cancer    Presented with  increased confusion , aphasia and seizure like activity Has been having increased confusion and yesterday had Seizure like activity Patient has been declining ever since his prior discharge.  He is unable to take care of himself unable to really eat without assistance needs help with the bathroom. Has been evaluated for possible dementia. Wife stated that today he had an episode where he grabbed side of a table repeatedly few times I cannot I cannot and then went limp for a little bit he was slightly shaking at the time and she was concerned it could be possible seizure and brought into emergency department Otherwise have not had any fevers he have not been reporting chest pain or shortness of breath.     Has  been vaccinated against COVID  and boosted had  flu shot   Initial COVID TEST  NEGATIVE   Lab Results  Component Value Date   [place] 06/23/2021   [place] NEGATIVE 06/15/2021   [place] NEGATIVE 06/04/2021   [place] NEGATIVE 01/06/2020     Regarding pertinent Chronic problems:       HTN on Norvasc , coreg   chronic CHF diastolic - last echo July 6283 Grade I diastolic      DM 2 -  Lab Results  Component Value Date   HGBA1C 7.1 (A) 06/08/2021   on insulin, problems with hypoglycemia Recently decreased his lantus down         CKD stage IIIb- baseline Cr 1.7 Estimated Creatinine Clearance: 42.3 mL/min (A) (by C-G formula based on SCr of 1.6 mg/dL (H)).  Lab Results  Component Value Date    CREATININE 1.60 (H) 06/23/2021   CREATININE 1.91 (H) 06/23/2021   CREATININE 1.70 (H) 06/18/2021       BPH - on Flomax,       Dementia - on Tramdol at night    While in ER:    Noted to have no further seizure activity while in the emergency department. Neurology has been aware will see in consult and order EEG in a.m.  Recommend adding MRA of the head and carotid Dopplers continue with aspirin 81 mg a day.  Ordered  CT HEAD   NON acute KUB - non acute CXR -  NON acute    MRI showed  Punctate acute infarct in the left frontal lobe. Remote lacunar infarct in left basal ganglia and moderate chronic microvascular disease  Following Medications were ordered in ER: Medications  insulin aspart (novoLOG) injection 0-9 Units (has no administration in time range)  atorvastatin (LIPITOR) tablet 10 mg (has no administration in time range)  brimonidine (ALPHAGAN) 0.2 % ophthalmic solution 1 drop (has no administration in time range)  dorzolamide-timolol (COSOPT) 22.3-6.8 MG/ML ophthalmic solution 1 drop (has no administration in time range)  latanoprost (XALATAN) 0.005 % ophthalmic solution 1 drop (has no administration in time range)  pantoprazole (PROTONIX) EC tablet 40 mg (has no administration in time range)  tamsulosin (FLOMAX) capsule 0.4 mg (  has no administration in time range)  traZODone (DESYREL) tablet 150 mg (has no administration in time range)   stroke: mapping our early stages of recovery book ( Does not apply Not Given 06/24/21 2156)  0.9 %  sodium chloride infusion (has no administration in time range)  acetaminophen (TYLENOL) tablet 650 mg (has no administration in time range)    Or  acetaminophen (TYLENOL) 160 MG/5ML solution 650 mg (has no administration in time range)    Or  acetaminophen (TYLENOL) suppository 650 mg (has no administration in time range)  aspirin EC tablet 81 mg (has no administration in time range)  sodium chloride 0.9 % bolus 500 mL (0 mLs  Intravenous Stopped 06/24/21 1505)  gadobutrol (GADAVIST) 1 MMOL/ML injection 8 mL (8 mLs Intravenous Contrast Given 06/24/21 1829)    _______________________________________________________ ER Provider Called: Neurology   Dr. Lorrin Goodell They Recommend admit to medicine   Will see in   ER   ED Triage Vitals  Enc Vitals Group     BP 06/23/21 1231 (!) 137/55     Pulse Rate 06/23/21 1231 69     Resp 06/23/21 1231 18     Temp 06/23/21 1231 98.4 F (36.9 C)     Temp Source 06/23/21 1231 Oral     SpO2 06/23/21 1231 100 %     Weight --      Height --      Head Circumference --      Peak Flow --      Pain Score 06/24/21 1614 0     Pain Loc --      Pain Edu? --      Excl. in [place]? --   TMAX(24)@     _________________________________________ Significant initial  Findings: Abnormal Labs Reviewed  DIFFERENTIAL - Abnormal; Notable for the following components:      Result Value   Abs Immature Granulocytes 0.10 (*)    All other components within normal limits  COMPREHENSIVE METABOLIC PANEL - Abnormal; Notable for the following components:   Sodium 134 (*)    Glucose, Bld 288 (*)    Creatinine, Ser 1.91 (*)    Albumin 3.1 (*)    GFR, Estimated 35 (*)    All other components within normal limits  I-STAT CHEM 8, ED - Abnormal; Notable for the following components:   Sodium 133 (*)    Creatinine, Ser 1.60 (*)    Glucose, Bld 288 (*)    TCO2 21 (*)    All other components within normal limits  CBG MONITORING, ED - Abnormal; Notable for the following components:   Glucose-Capillary 288 (*)    All other components within normal limits  CBG MONITORING, ED - Abnormal; Notable for the following components:   Glucose-Capillary 306 (*)    All other components within normal limits  CBG MONITORING, ED - Abnormal; Notable for the following components:   Glucose-Capillary 263 (*)    All other components within normal limits     _________________________ Troponin  52 ECG: Ordered Personally  reviewed by me showing: HR : 67 Rhythm:  NSR,    no evidence of ischemic changes QTC 464     The recent clinical data is shown below. Vitals:   06/24/21 1614 06/24/21 1636 06/24/21 1637 06/24/21 1909  BP: (!) 162/87   (!) 160/82  Pulse: 100   98  Resp: 20   20  Temp: 98 F (36.7 C)     TempSrc: Oral     SpO2:  100% 100% 100% 100%     WBC     Component Value Date/Time   WBC 5.7 06/23/2021 1243   LYMPHSABS 1.7 06/23/2021 1243   MONOABS 0.4 06/23/2021 1243   EOSABS 0.1 06/23/2021 1243   BASOSABS 0.1 06/23/2021 1243     Lactic Acid, Venous    Component Value Date/Time   LATICACIDVEN 1.2 06/24/2021 2325      UA   ordered     Results for orders placed or performed during the hospital encounter of 06/23/21  Resp Panel by RT-PCR (Flu A&B, Covid) Nasopharyngeal Swab     Status: None   Collection Time: 06/23/21 12:43 PM   Specimen: Nasopharyngeal Swab; Nasopharyngeal(NP) swabs in vial transport medium  Result Value Ref Range Status   SARS Coronavirus 2 by RT PCR NEGATIVE NEGATIVE Final         Influenza A by PCR NEGATIVE NEGATIVE Final   Influenza B by PCR NEGATIVE NEGATIVE Final          _______________________________________________ Hospitalist was called for admission for  CVA  The following Work up has been ordered so far:  Orders Placed This Encounter  Procedures   Resp Panel by RT-PCR (Flu A&B, Covid) Nasopharyngeal Swab   CT Head Wo Contrast   MR Brain W and Wo Contrast   MR ANGIO HEAD WO CONTRAST   DG CHEST PORT 1 VIEW   DG Abd 1 View   Ethanol   Protime-INR   APTT   CBC   Differential   Comprehensive metabolic panel   Urine rapid drug screen (hosp performed)   Urinalysis, Routine w reflex microscopic   Hemoglobin A1c   Lipid panel   CK   CBC with Differential/Platelet   Comprehensive metabolic panel   Magnesium   Phosphorus   Urinalysis, Routine w reflex microscopic   Diet NPO time specified   Vital signs   Cardiac Monitoring    Modified Stroke Scale (mNIHSS) Document mNIHSS assessment every 2 hours for a total of 12 hours   Stroke swallow screen   Initiate Carrier Fluid Protocol   If O2 sat If O2 Sat < 94%, administer O2 at 2 liters/minute via nasal cannula.   STAT CBG when hypoglycemia is suspected. If treated, recheck every 15 minutes after each treatment until CBG >/= 70 mg/dl   Refer to Hypoglycemia Protocol Sidebar Report for treatment of CBG < 70 mg/dl   SCD's   NIHSS score documentation NIHSS score range: 0-42   Vital signs every 2 hours x 12 hours, then every 4 hours   Notify physician (specify)   OOB with assistance   Stroke swallow screen - If patient does NOT pass this screen, place order for SLP eval and treat (SLP2) - swallowing evaluation (BSE, MBS and/or diet order as indicated)   RN to notify Physician for appropriate diet after patient passes stroke swallow screen   Modified Stroke Scale (mNIHSS) every 2 hours x 12 hours then every 4 hours   NIH stroke scale   Intake and output   Cardiac Monitoring - Continuous Indefinite   Discuss with patient and document patient's goals for stroke risk factor reduction   Initiate Oral Care Protocol   Initiate Carrier Fluid Protocol   Provide stroke education material to patient and family.   Nurse to provide smoking / tobacco cessation education   If the patient has passed the Stroke Swallow Screen or has a feeding tube, then RN may order General Admission PRN Orders (through manage  orders) for the following patient needs: allergy symptoms (Claritin), cold sores (Carmex), cough (Robitussin DM), eye irritation (Liquifilm Tears), hemorrhoids (Tucks), indigestion (Maalox), minor skin irritation (hydrocortisone cream), muscle pain Suezanne Jacquet Gay), nose irritation (saline nasal spray) and sore throat (Chloraseptic spray).   Full code   Consult to neurology   Consult to Transition of Care Team (SW and CM)   Consult to neurology   Consult to hospitalist   OT eval and  treat   PT eval and treat   Pulse oximetry, continuous   Pulse Oximetry Pulse Oximetry check every 2 hours X 12 hours, then every 4 hours.   Oxygen therapy Mode or (Route): Nasal cannula; Liters Per Minute: 2; Keep 02 saturation: greater than 94 %   SLP eval and treat Reason for evaluation: Cognitive/Language evaluation   I-stat chem 8, ED (not at [facility] or [place])   I-stat chem 8, ED   POC CBG, ED   CBG monitoring, ED   CBG monitoring, ED   ED EKG   EKG   EKG 12-Lead   ECHOCARDIOGRAM COMPLETE   Saline lock IV   Admit to Inpatient (patient's expected length of stay will be greater than 2 midnights or inpatient only procedure)   Fall precautions   Aspiration precautions   VAS US CAROTID (at [facility] and [facility] only)     OTHER Significant initial  Findings:  labs showing:    Recent Labs  Lab 06/18/21 0343 06/23/21 1243 06/23/21 1308 06/24/21 2325  NA 138 134* 133* 132*  K 3.9 4.7 4.7 4.5  CO2 24 22  --  23  GLUCOSE 232* 288* 288* 240*  BUN 33* 21 23 23   CREATININE 1.70* 1.91* 1.60* 1.75*  CALCIUM 8.7* 9.1  --  9.1  MG 1.8  --   --  1.9  PHOS 3.0  --   --  3.3    Cr   stable  Lab Results  Component Value Date   CREATININE 1.75 (H) 06/24/2021   CREATININE 1.60 (H) 06/23/2021   CREATININE 1.91 (H) 06/23/2021    Recent Labs  Lab 06/18/21 0343 06/23/21 1243  AST 16 19  ALT 14 16  ALKPHOS 54 74  BILITOT 0.6 0.7  PROT 5.7* 6.7  ALBUMIN 2.9* 3.1*   Lab Results  Component Value Date   CALCIUM 9.1 06/23/2021   PHOS 3.0 06/18/2021          Plt: Lab Results  Component Value Date   PLT 364 06/23/2021      COVID-19 Labs  No results for input(s): DDIMER, FERRITIN, LDH, CRP in the last 72 hours.  Lab Results  Component Value Date   SARSCOV2NAA NEGATIVE 06/23/2021   [place] NEGATIVE 06/15/2021   [place] NEGATIVE 06/04/2021   [place] NEGATIVE 01/06/2020         Recent Labs  Lab 06/18/21 0343 06/23/21 1243 06/23/21 1308  WBC 7.9 5.7  --    NEUTROABS 4.2 3.3  --   HGB 11.1* 13.3 14.3  HCT 34.2* 42.2 42.0  MCV 88.4 90.6  --   PLT 271 364  --     HG/HCT stable,      Component Value Date/Time   HGB 14.3 06/23/2021 1308   HCT 42.0 06/23/2021 1308   MCV 90.6 06/23/2021 1243     No results for input(s): AMMONIA in the last 168 hours.    Cardiac Panel (last 3 results) Recent Labs    06/24/21 2325  CKTOTAL 227    .car BNP (  last 3 results) No results for input(s): BNP in the last 8760 hours.    DM  labs:  HbA1C: Recent Labs    10/01/20 1146 01/01/21 0949 06/08/21 1309  HGBA1C 8.8* 6.9* 7.1*      CBG (last 3)  Recent Labs    06/23/21 1244 06/24/21 0135 06/24/21 1305  GLUCAP 288* 306* 263*      Cultures:    Component Value Date/Time   SDES BLOOD LEFT HAND 07/30/2017 2126   SPECREQUEST  07/30/2017 2126    BOTTLES DRAWN AEROBIC ONLY Blood Culture adequate volume   CULT  07/30/2017 2126    NO GROWTH 5 DAYS Performed at [facility], [place] [address] [place], [place] [address]    REPTSTATUS 08/04/2017 FINAL 07/30/2017 2126     Radiological Exams on Admission: DG Chest 1 View  Result Date: 06/24/2021 CLINICAL DATA:  Seizure, stroke EXAM: CHEST  1 VIEW COMPARISON:  06/15/2021 FINDINGS: Single frontal view of the chest demonstrates an unremarkable cardiac silhouette. No airspace disease, effusion, or pneumothorax. No acute bony abnormality. IMPRESSION: 1. No acute intrathoracic process. Electronically Signed   By: Randa Ngo M.D.   On: 06/24/2021 22:40   DG Abd 1 View  Result Date: 06/24/2021 CLINICAL DATA:  Seizure, stroke EXAM: ABDOMEN - 1 VIEW COMPARISON:  07/17/2015 FINDINGS: Supine frontal views of the abdomen and pelvis are obtained. Hemidiaphragms are excluded by collimation. Bowel gas pattern is unremarkable without obstruction or ileus. No masses or abnormal calcifications. Radiotherapy seeds and surgical clips are seen within the prostate bed. Penile prosthesis is noted. No acute bony  abnormalities. IMPRESSION: 1. Unremarkable bowel gas pattern. Electronically Signed   By: Randa Ngo M.D.   On: 06/24/2021 22:41   CT Head Wo Contrast  Result Date: 06/23/2021 CLINICAL DATA:  Mental status change, unknown cause. Generalized weakness. EXAM: CT HEAD WITHOUT CONTRAST TECHNIQUE: Contiguous axial images were obtained from the base of the skull through the vertex without intravenous contrast. COMPARISON:  01/05/2020 FINDINGS: Brain: Stable cerebral atrophy. No evidence for acute hemorrhage, mass lesion, midline shift, hydrocephalus or large infarct. Stable low-density in the periventricular and subcortical white matter is suggestive for chronic changes. Stable low-density area in the left basal ganglia could represent an old lacune infarct. Vascular: Calcification involving the right vertebral artery. Skull: Normal. Negative for fracture or focal lesion. Sinuses/Orbits: Possible polyp or retention cyst in the right maxillary sinus but incompletely imaged. Mild disease in left maxillary sinus. Other: None. IMPRESSION: 1. No acute intracranial abnormality. 2. Stable atrophy and evidence for chronic small vessel ischemic changes. Electronically Signed   By: Markus Daft M.D.   On: 06/23/2021 13:17   MR Brain W and Wo Contrast  Result Date: 06/24/2021 CLINICAL DATA:  Seizure, new-onset, no history of trauma EXAM: MRI HEAD WITHOUT AND WITH CONTRAST TECHNIQUE: Multiplanar, multiecho pulse sequences of the brain and surrounding structures were obtained without and with intravenous contrast. CONTRAST:  19m GADAVIST GADOBUTROL 1 MMOL/ML IV SOLN COMPARISON:  Same day CT head. FINDINGS: Motion limited study. Brain: Punctate acute infarct in the left frontal lobe (series 3 and 300, image 30). No mass effect. Remote lacunar infarct in the left basal ganglia. Additional patchy T2/FLAIR hyperintensity within the white matter, nonspecific but compatible with chronic microvascular ischemic disease. Generalized  atrophy. No evidence of acute hemorrhage, hydrocephalus, mass lesion, midline shift, or extra-axial fluid collection. The hippocampi appear to be within normal limits and symmetric in size/signal on the mildly limited coronal thin sequences.  No evidence of abnormal enhancement. Vascular: Major arterial flow voids are maintained skull base. Skull and upper cervical spine: Normal marrow signal. Sinuses/Orbits: Mild paranasal sinus mucosal thickening. Unremarkable orbits. Other: No mastoid effusions. IMPRESSION: 1. Punctate acute infarct in the left frontal lobe. 2. Remote lacunar infarct in left basal ganglia and moderate chronic microvascular disease. 3.  Cerebral atrophy (ICD10-G31.9). Electronically Signed   By: Margaretha Sheffield M.D.   On: 06/24/2021 18:49   _______________________________________________________________________________________________________ Latest  Blood pressure (!) 160/82, pulse 98, temperature 98 F (36.7 C), temperature source Oral, resp. rate 20, SpO2 100 %.   Vitals  labs and radiology finding personally reviewed  Review of Systems:    Pertinent positives include:  fatigue,  Confusion, aphasia Constitutional:  No weight loss, night sweats, Fevers, chills,  weight loss  HEENT:  No headaches, Difficulty swallowing,Tooth/dental problems,Sore throat,  No sneezing, itching, ear ache, nasal congestion, post nasal drip,  Cardio-vascular:  No chest pain, Orthopnea, PND, anasarca, dizziness, palpitations.no Bilateral lower extremity swelling  GI:  No heartburn, indigestion, abdominal pain, nausea, vomiting, diarrhea, change in bowel habits, loss of appetite, melena, blood in stool, hematemesis Resp:  no shortness of breath at rest. No dyspnea on exertion, No excess mucus, no productive cough, No non-productive cough, No coughing up of blood.No change in color of mucus.No wheezing. Skin:  no rash or lesions. No jaundice GU:  no dysuria, change in color of urine, no urgency  or frequency. No straining to urinate.  No flank pain.  Musculoskeletal:  No joint pain or no joint swelling. No decreased range of motion. No back pain.  Psych:  No change in mood or affect. No depression or anxiety. No memory loss.  Neuro: no localizing neurological complaints, no tingling, no weakness, no double vision, no gait abnormality, no slurred speech, no   All systems reviewed and apart from [place] all are negative _______________________________________________________________________________________________ Past Medical History:   Past Medical History:  Diagnosis Date   AKI (acute kidney injury) ([place]) 06/18/2015   Cancer ([place])    hx of prostate cancer   Chronic kidney disease    Diabetes mellitus without complication ([place])    DKA (diabetic ketoacidoses) 06/18/2015   Glaucoma    Hyperlipidemia    Hypertension       Past Surgical History:  Procedure Laterality Date   AMPUTATION Left 07/03/2015   Procedure: Left Foot 1st and 2nd Ray Amputation;  Surgeon: Newt Minion, MD;  Location: [facility];  Service: Orthopedics;  Laterality: Left;   BASCILIC VEIN TRANSPOSITION Left 07/14/2015   Procedure: BRACHIOBASILIC VEIN TRANSPOSITION  ;  Surgeon: Elam Dutch, MD;  Location: [facility] OR;  Service: Vascular;  Laterality: Left;   IRIDOTOMY / IRIDECTOMY     prostate seeds      Social History:  Ambulatory  walker      reports that he has never smoked. He has never used smokeless tobacco. He reports current alcohol use. He reports that he does not use drugs.     Family History:   Family History  Problem Relation Age of Onset   Diabetes Mother    Hypertension Mother    Stroke Mother    Diabetes Sister    Glaucoma Sister    Diabetes Brother    Glaucoma Brother    Amblyopia Neg Hx    Blindness Neg Hx    Cataracts Neg Hx    Macular degeneration Neg Hx    Retinal detachment Neg Hx    Strabismus Neg  Hx    Retinitis pigmentosa Neg Hx     ______________________________________________________________________________________________ Allergies: No Known Allergies   Prior to Admission medications   Medication Sig Start Date End Date Taking? Authorizing Provider  amLODipine (NORVASC) 10 MG tablet Take 1 tablet (10 mg total) by mouth daily. 06/18/21 09/16/21  Kayleen Memos, DO  atorvastatin (LIPITOR) 10 MG tablet Take 1 tablet (10 mg total) by mouth daily. 10/01/20   [name], MD  Azelastine HCl 137 MCG/SPRAY SOLN USE 2 SPRAYS INTO BOTH NOSTRILS 2 (TWO) TIMES DAILY. Patient taking differently: Place 2 sprays into both nostrils 2 (two) times daily. 05/18/20   [name], MD  Blood Glucose Monitoring Suppl (TRUE METRIX METER) w/Device KIT USE AS DIRECTED 02/10/20   [name], MD  brimonidine [facility]) 0.2 % ophthalmic solution Place 1 drop into both eyes in the morning and at bedtime. Original SIG was 1 gtt ou tid  But pt states only taking 1 gtt ou bid 06/01/18   [provider]  carvedilol (COREG) 3.125 MG tablet Take 1 tablet (3.125 mg total) by mouth 2 (two) times daily with a meal. 10/01/20   [name], MD  Continuous Blood Gluc Receiver (FREESTYLE LIBRE 2 READER) DEVI USe 4 times daily to check blood dsugar 06/08/21   [name], MD  Continuous Blood Gluc Sensor (FREESTYLE LIBRE 2 SENSOR) MISC Use 4 times daily to check blood sugar. 06/08/21   [name], MD  dorzolamide-timolol (COSOPT) 22.3-6.8 MG/ML ophthalmic solution Place 1 drop into both eyes 2 (two) times daily.  04/02/19   [provider]  fluticasone (FLONASE) 50 MCG/ACT nasal spray Place 2 sprays into both nostrils daily. 07/24/18   [name], MD  insulin aspart (NOVOLOG FLEXPEN) 100 UNIT/ML FlexPen Inject 2 Units into the skin 3 (three) times daily with meals. 06/18/21   Kayleen Memos, DO  insulin glargine (LANTUS SOLOSTAR) 100 UNIT/ML Solostar Pen Inject 5 Units into the skin daily. 06/18/21   Kayleen Memos,  DO  Insulin Pen Needle (PEN NEEDLES) 32G X 5 MM MISC 1 application by Does not apply route 4 (four) times daily as needed (to inject insulin). 08/03/17   [name], MD  latanoprost (XALATAN) 0.005 % ophthalmic solution INSTILL 1 DROP INTO BOTH EYES AT BEDTIME Patient taking differently: Place 1 drop into both eyes at bedtime. 03/29/18   [name], MD  Multiple Vitamin (MULTIVITAMIN WITH MINERALS) TABS tablet Take 1 tablet by mouth daily. 06/18/21 09/16/21  Kayleen Memos, DO  pantoprazole (PROTONIX) 40 MG tablet Take 1 tablet (40 mg total) by mouth 2 (two) times daily. Patient taking differently: Take 40 mg by mouth daily. 10/01/20   [name], MD  polyethylene glycol (MIRALAX) 17 g packet Take 17 g by mouth daily as needed for mild constipation. 06/18/21   Kayleen Memos, DO  tamsulosin (FLOMAX) 0.4 MG CAPS capsule Take 1 capsule (0.4 mg total) by mouth daily. 10/01/20   [name], MD  traZODone (DESYREL) 150 MG tablet TAKE 2 TABLETS AT BEDTIME Patient taking differently: Take 150 mg by mouth at bedtime as needed for sleep. 01/29/21   [name], MD  triamcinolone ointment (KENALOG) 0.5 % APPLY TOPICALLY 2 TIMES DAILY Patient taking differently: Apply 1 application topically 2 (two) times daily. 02/15/21   [name], MD  TRUE METRIX BLOOD GLUCOSE TEST test strip TEST BLOOD SUGAR FOUR TIMES DAILY AND AS NEEDED 07/28/20  [name], MD  TRUEplus Lancets 33G MISC CHECK BLOOD SUGAR FOUR TIMES DAILY  AND AS NEEDED Patient taking differently: 1 each by Other route See admin instructions. CHECK BLOOD SUGAR FOUR TIMES DAILY  AND AS NEEDED 10/04/19   [name], MD  Vitamin D3 (VITAMIN D) 25 MCG tablet Take 1,000 Units by mouth daily.    [provider]    ___________________________________________________________________________________________________ Physical Exam: Vitals with BMI 06/24/2021 06/24/2021 06/24/2021  Height - - -  Weight - - -  BMI - - -   Systolic 379 024 097  Diastolic 82 87 76  Pulse 98 100 72     1. General:  in No  Acute distress   Chronically ill   -appearing 2. Psychological: Alert and   Oriented to self 3. Head/ENT:   Dry Mucous Membranes                          Head Non traumatic, neck supple                           Poor Dentition 4. SKIN:  decreased Skin turgor,  Skin clean Dry and intact no rash 5. Heart: Regular rate and rhythm no  Murmur, no Rub or gallop 6. Lungs:  , no wheezes or crackles   7. Abdomen: Soft,  non-tender, Non distended  bowel sounds present 8. Lower extremities: no clubbing, cyanosis, no  edema 9. Neurologically  strength 5 out of 5 in all 4 extremities cranial nerves II through XII intact 10. MSK: Normal range of motion    Chart has been reviewed  ______________________________________________________________________________________________  Assessment/Plan a 81 y.o. male with medical history significant of DM2, HTN, dementia, prostate cancer    Admitted for cva and acute encephalopathy  Present on Admission:  CVA (cerebral vascular accident) ([place])  Hypertension associated with diabetes ([place])  Hyperlipidemia associated with type 2 diabetes mellitus ([place])  CKD (chronic kidney disease), stage III ([place])  Glaucoma  Abdominal distension  Chronic diastolic CHF (congestive heart failure) ([place])  Acute metabolic encephalopathy     CVA (cerebral vascular accident) ([place])  - will admit based on TIA/CVA protocol,         Monitor on Tele        MRI  Resulted - showing acute ischemic CVA  frontal        Carotid Doppler will order        Echo to evaluate for possible embolic source,        obtain cardiac enzymes,  ECG,   Lipid panel, TSH.        Order PT/OT evaluation.       keep nothing by mouth until passes swallow eval       Will make sure patient is on antiplatelet ASA  81 mg and statin        Allow permissive Hypertension keep BP <220/120        Neurology consulted will  see  pt in ER    Hypertension associated with diabetes ([place]) Permissive HTN   Hyperlipidemia associated with type 2 diabetes mellitus ([place]) On lipitor  CKD (chronic kidney disease), stage III (H[facility])  -chronic avoid nephrotoxic medications such as NSAIDs, Vanco Zosyn combo,  avoid hypotension, continue to follow renal function   T2DM (type 2 diabetes mellitus) ([place])  - Order Sensitive   SSI   - hold lantus  -  check  TSH and HgA1C      Glaucoma Continue home meds  Abdominal distension Obtain KUB  Chronic diastolic CHF (congestive heart failure) (HCC) - currently appears to be slightly on the dry side, and restart when appears euvolemic, carefuly follow fluid status and Cr   Dementia without behavioral disturbance ([place]) Monitor for any sundowning  Seizure-like activity [facility]) Neurology is aware order eeg  Acute metabolic encephalopathy   - most likely multifactorial secondary to combination of   mild dehydration secondary to decreased by mouth intake,  polypharmacy dementia sundowing  - Will rehydrate   - look for underlining infection CXR neg UA pening  - Hold contributing medications   -  MRI of the brain showing small cva but doe not explain degree of confusion  - neurological exam appears to be nonfocal but patient unable to cooperate fully      Other plan as per orders.  DVT prophylaxis:  SCD     SCD's Start: 06/24/21 2150    Code Status:    Code Status: Full Code FULL CODE   as per patient  /family I had personally discussed CODE STATUS with patient      Family Communication:   Family not at  Bedside    Disposition Plan:     likely will need placement for rehabilitation                             Following barriers for discharge:                            Electrolytes corrected                                                            Will need to be able to tolerate PO                            Will likely need home health, home O2, set up                            Will need consultants to evaluate patient prior to discharge                       Would benefit from PT/OT eval prior to DC  Ordered                   Swallow eval - SLP ordered                    palliative                    Consults called: neurology is aware  Admission status:  ED Disposition     ED Disposition  [place]: [place] [[id number]]  Level of Care: Telemetry Medical [104]  May admit patient to [facility] or [facility] if equivalent level of care is available:: No  Covid Evaluation: Asymptomatic Screening Protocol (No Symptoms)  Diagnosis: CVA (cerebral vascular accident) [facility]) [[id number]]  Admitting Physician:  Toy Baker [[id number]]  Attending Physician: Toy Baker [[id number]]  Estimated length of stay: past midnight tomorrow  Certification:: I certify this patient will need inpatient services for at least 2 midnights           inpatient     I Expect 2 midnight stay secondary to severity of patient's current illness need for inpatient interventions justified by the following:    Severe lab/radiological/exam abnormalities including:    stroke and extensive comorbidities including:    DM2   CHF     CKD  dementia     malignancy,    That are currently affecting medical management.   I expect  patient to be hospitalized for 2 midnights requiring inpatient medical care.  Patient is at high risk for adverse outcome (such as loss of life or disability) if not treated.  Indication for inpatient stay as follows:  Severe change from baseline regarding mental status     Need for  IV fluids,      Level of care     tele indefinitely please discontinue once patient no longer qualifies COVID-19 Labs    Lab Results  Component Value Date   [place] NEGATIVE 06/23/2021     Precautions: admitted as   Covid Negative       Estefanny Moler 06/25/2021, 1:45 AM      [facility]     after 2 AM please page floor coverage PA If 7AM-7PM, please contact the day team taking care of the patient using [email or web address]   Patient was evaluated in the context of the global COVID-19 pandemic, which necessitated consideration that the patient might be at risk for infection with the SARS-CoV-2 virus that causes COVID-19. Institutional protocols and algorithms that pertain to the evaluation of patients at risk for COVID-19 are in a state of rapid change based on information released by regulatory bodies including the CDC and federal and state organizations. These policies and algorithms were followed during the patient's care.

## 2021-06-24 NOTE — NC FL2 (Signed)
[place] LEVEL OF CARE SCREENING TOOL     IDENTIFICATION  Patient Name: David Ford Birthdate: 05-18-1941 Sex: male Admission Date (Current Location): 06/23/2021  [facility] and [place] Number:  Herbalist and Address:  [facility] [place], [place] [address], [place], [place] [address]      Provider Number: [id number]  Attending Physician Name and Address:  Lucrezia Starch, MD  Relative Name and Phone Number:  Claude, [place]man   [phone]    Current Level of Care: Hospital Recommended Level of Care: Assisted Living Facility Prior Approval Number:    Date Approved/Denied:   PASRR Number:    Discharge Plan: Home    Current Diagnoses: Patient Active Problem List   Diagnosis Date Noted   Acute-on-chronic kidney injury ([place]) 06/16/2021   Anemia 06/16/2021   Hypoglycemia 06/15/2021   Cognitive impairment 06/08/2021   Esophagitis 01/06/2020   Dermatitis 08/20/2018   Glaucoma 01/31/2018   CKD (chronic kidney disease), stage III 08/03/2017   Hyperlipidemia associated with type 2 diabetes mellitus ([place]) 06/02/2017   BPH (benign prostatic hyperplasia) 06/02/2017   History of prostate cancer 06/02/2017   Insomnia 06/02/2017   Hypertension associated with diabetes ([place]) 06/17/2015   T2DM (type 2 diabetes mellitus) ([place]) 06/17/2015    Orientation RESPIRATION BLADDER Height & Weight     Self, Time  Normal Incontinent Weight:   Height:     BEHAVIORAL SYMPTOMS/MOOD NEUROLOGICAL BOWEL NUTRITION STATUS      Incontinent Diet (diabetic friendly)  AMBULATORY STATUS COMMUNICATION OF NEEDS Skin   Supervision Verbally Normal                       Personal Care Assistance Level of Assistance  Bathing, Feeding, Dressing Bathing Assistance: Limited assistance Feeding assistance: Limited assistance Dressing Assistance: Limited assistance     Functional Limitations Info  Sight, Hearing, Speech Sight Info: Adequate Hearing Info:  Adequate Speech Info: Adequate    SPECIAL CARE FACTORS FREQUENCY                       Contractures Contractures Info: Not present    Additional Factors Info  Code Status, Allergies Code Status Info: Full Allergies Info: NKA           Current Medications (06/24/2021):  This is the current hospital active medication list No current facility-administered medications for this encounter.   Current Outpatient Medications  Medication Sig Dispense Refill   amLODipine (NORVASC) 10 MG tablet Take 1 tablet (10 mg total) by mouth daily. 90 tablet 0   atorvastatin (LIPITOR) 10 MG tablet Take 1 tablet (10 mg total) by mouth daily. 90 tablet 1   Azelastine HCl 137 MCG/SPRAY SOLN USE 2 SPRAYS INTO BOTH NOSTRILS 2 (TWO) TIMES DAILY. (Patient taking differently: Place 2 sprays into both nostrils 2 (two) times daily.) 120 mL 5   Blood Glucose Monitoring Suppl (TRUE METRIX METER) w/Device KIT USE AS DIRECTED 1 kit 0   brimonidine (ALPHAGAN) 0.2 % ophthalmic solution Place 1 drop into both eyes in the morning and at bedtime. Original SIG was 1 gtt ou tid  But pt states only taking 1 gtt ou bid     carvedilol (COREG) 3.125 MG tablet Take 1 tablet (3.125 mg total) by mouth 2 (two) times daily with a meal. 180 tablet 3   Continuous Blood Gluc Receiver (FREESTYLE LIBRE 2 READER) DEVI USe 4 times daily to check blood dsugar 1 each  11   Continuous Blood Gluc Sensor (FREESTYLE LIBRE 2 SENSOR) MISC Use 4 times daily to check blood sugar. 1 each 11   dorzolamide-timolol (COSOPT) 22.3-6.8 MG/ML ophthalmic solution Place 1 drop into both eyes 2 (two) times daily.      fluticasone (FLONASE) 50 MCG/ACT nasal spray Place 2 sprays into both nostrils daily. 16 g 6   insulin aspart (NOVOLOG FLEXPEN) 100 UNIT/ML FlexPen Inject 2 Units into the skin 3 (three) times daily with meals. 15 mL 0   insulin glargine (LANTUS SOLOSTAR) 100 UNIT/ML Solostar Pen Inject 5 Units into the skin daily. 45 mL 0   Insulin Pen  Needle (PEN NEEDLES) 32G X 5 MM MISC 1 application by Does not apply route 4 (four) times daily as needed (to inject insulin). 100 each 11   latanoprost (XALATAN) 0.005 % ophthalmic solution INSTILL 1 DROP INTO BOTH EYES AT BEDTIME (Patient taking differently: Place 1 drop into both eyes at bedtime.) 3 mL 3   Multiple Vitamin (MULTIVITAMIN WITH MINERALS) TABS tablet Take 1 tablet by mouth daily. 90 tablet 0   pantoprazole (PROTONIX) 40 MG tablet Take 1 tablet (40 mg total) by mouth 2 (two) times daily. (Patient taking differently: Take 40 mg by mouth daily.) 180 tablet 3   polyethylene glycol (MIRALAX) 17 g packet Take 17 g by mouth daily as needed for mild constipation. 14 each 0   tamsulosin (FLOMAX) 0.4 MG CAPS capsule Take 1 capsule (0.4 mg total) by mouth daily. 90 capsule 3   traZODone (DESYREL) 150 MG tablet TAKE 2 TABLETS AT BEDTIME (Patient taking differently: Take 150 mg by mouth at bedtime as needed for sleep.) 180 tablet 0   triamcinolone ointment (KENALOG) 0.5 % APPLY TOPICALLY 2 TIMES DAILY (Patient taking differently: Apply 1 application topically 2 (two) times daily.) 30 g 2   TRUE METRIX BLOOD GLUCOSE TEST test strip TEST BLOOD SUGAR FOUR TIMES DAILY AND AS NEEDED 450 strip 1   TRUEplus Lancets 33G MISC CHECK BLOOD SUGAR FOUR TIMES DAILY  AND AS NEEDED (Patient taking differently: 1 each by Other route See admin instructions. CHECK BLOOD SUGAR FOUR TIMES DAILY  AND AS NEEDED) 500 each 3   Vitamin D3 (VITAMIN D) 25 MCG tablet Take 1,000 Units by mouth daily.       Discharge Medications: Please see discharge summary for a list of discharge medications.  Relevant Imaging Results:  Relevant Lab Results:   Additional Information SSN# [id number] COVID-19 Vaccine 07/09/2020 , 08/12/2019 , 07/22/2019  [place], LCSW

## 2021-06-24 NOTE — Assessment & Plan Note (Signed)
Obtain KUB 

## 2021-06-24 NOTE — Assessment & Plan Note (Signed)
-  chronic avoid nephrotoxic medications such as NSAIDs, Vanco Zosyn combo,  avoid hypotension, continue to follow renal function  

## 2021-06-24 NOTE — Assessment & Plan Note (Signed)
On lipitor

## 2021-06-24 NOTE — Telephone Encounter (Signed)
VO given.

## 2021-06-24 NOTE — ED Notes (Signed)
Pt's wife came while pt in MRI and requested to  speak with case management about pt discharge and her interest facility placement for pt. Case management came to speak with pt's

## 2021-06-24 NOTE — ED Notes (Signed)
Patient transported to MRI 

## 2021-06-24 NOTE — Telephone Encounter (Signed)
Christia Reading form [facility] MRI [phone] requesting  patient Safety information on penile implant. Spoke with patient wife, stated implant was placed in [place] unsure of date  Will call back with Dr information

## 2021-06-24 NOTE — ED Notes (Signed)
Pt was incontinent of urine, new brief and pads placed. External catheter applied, linens changed.

## 2021-06-24 NOTE — ED Notes (Signed)
Pt moved into hospital bed with fresh linen and resting comfortably.

## 2021-06-24 NOTE — ED Notes (Signed)
Pt still in MRI 

## 2021-06-25 ENCOUNTER — Inpatient Hospital Stay (HOSPITAL_COMMUNITY): Payer: Medicare HMO

## 2021-06-25 ENCOUNTER — Inpatient Hospital Stay: Payer: Medicare HMO | Admitting: Family Medicine

## 2021-06-25 DIAGNOSIS — I6389 Other cerebral infarction: Secondary | ICD-10-CM | POA: Diagnosis not present

## 2021-06-25 DIAGNOSIS — G9341 Metabolic encephalopathy: Secondary | ICD-10-CM | POA: Diagnosis present

## 2021-06-25 DIAGNOSIS — R569 Unspecified convulsions: Secondary | ICD-10-CM

## 2021-06-25 LAB — LIPID PANEL
Cholesterol: 202 mg/dL — ABNORMAL HIGH (ref 0–200)
HDL: 34 mg/dL — ABNORMAL LOW (ref >40)
LDL Cholesterol: 154 mg/dL — ABNORMAL HIGH (ref 0–99)
Total CHOL/HDL Ratio: 5.9 RATIO
Triglycerides: 71 mg/dL (ref <150)
VLDL: 14 mg/dL (ref 0–40)

## 2021-06-25 LAB — COMPREHENSIVE METABOLIC PANEL
ALT: 14 U/L (ref 0–44)
AST: 17 U/L (ref 15–41)
Albumin: 3.1 g/dL — ABNORMAL LOW (ref 3.5–5.0)
Alkaline Phosphatase: 78 U/L (ref 38–126)
Anion gap: 7 (ref 5–15)
BUN: 23 mg/dL (ref 8–23)
CO2: 23 mmol/L (ref 22–32)
Calcium: 9.1 mg/dL (ref 8.9–10.3)
Chloride: 102 mmol/L (ref 98–111)
Creatinine, Ser: 1.75 mg/dL — ABNORMAL HIGH (ref 0.61–1.24)
GFR, Estimated: 39 mL/min — ABNORMAL LOW (ref >60)
Glucose, Bld: 240 mg/dL — ABNORMAL HIGH (ref 70–99)
Potassium: 4.5 mmol/L (ref 3.5–5.1)
Sodium: 132 mmol/L — ABNORMAL LOW (ref 135–145)
Total Bilirubin: 0.5 mg/dL (ref 0.3–1.2)
Total Protein: 6.2 g/dL — ABNORMAL LOW (ref 6.5–8.1)

## 2021-06-25 LAB — CK: Total CK: 227 U/L (ref 49–397)

## 2021-06-25 LAB — TROPONIN I (HIGH SENSITIVITY)
Troponin I (High Sensitivity): 52 ng/L — ABNORMAL HIGH (ref <18)
Troponin I (High Sensitivity): 48 ng/L — ABNORMAL HIGH (ref <18)
Troponin I (High Sensitivity): 49 ng/L — ABNORMAL HIGH (ref <18)

## 2021-06-25 LAB — ECHOCARDIOGRAM COMPLETE
Area-P 1/2: 2.22 cm2
S' Lateral: 2.6 cm

## 2021-06-25 LAB — CBG MONITORING, ED
Glucose-Capillary: 164 mg/dL — ABNORMAL HIGH (ref 70–99)
Glucose-Capillary: 179 mg/dL — ABNORMAL HIGH (ref 70–99)
Glucose-Capillary: 168 mg/dL — ABNORMAL HIGH (ref 70–99)

## 2021-06-25 LAB — HEMOGLOBIN A1C
Hgb A1c MFr Bld: 7.2 % — ABNORMAL HIGH (ref 4.8–5.6)
Mean Plasma Glucose: 159.94 mg/dL

## 2021-06-25 LAB — GLUCOSE, CAPILLARY
Glucose-Capillary: 189 mg/dL — ABNORMAL HIGH (ref 70–99)
Glucose-Capillary: 264 mg/dL — ABNORMAL HIGH (ref 70–99)

## 2021-06-25 LAB — PHOSPHORUS: Phosphorus: 3.3 mg/dL (ref 2.5–4.6)

## 2021-06-25 LAB — MAGNESIUM: Magnesium: 1.9 mg/dL (ref 1.7–2.4)

## 2021-06-25 LAB — LACTIC ACID, PLASMA: Lactic Acid, Venous: 1.2 mmol/L (ref 0.5–1.9)

## 2021-06-25 MED ORDER — PERFLUTREN LIPID MICROSPHERE
1.0000 mL | INTRAVENOUS | Status: AC | PRN
  Administered 2021-06-25: 2 mL via INTRAVENOUS
  Filled 2021-06-25: qty 10

## 2021-06-25 MED ORDER — HYDROXYZINE HCL 10 MG PO TABS
10.0000 mg | ORAL_TABLET | Freq: Three times a day (TID) | ORAL | Status: DC | PRN
  Administered 2021-06-25: 10 mg via ORAL
  Filled 2021-06-25 (×2): qty 1

## 2021-06-25 MED ORDER — ATORVASTATIN CALCIUM 80 MG PO TABS
80.0000 mg | ORAL_TABLET | Freq: Every day | ORAL | Status: DC
  Administered 2021-06-25 – 2021-06-26 (×2): 80 mg via ORAL
  Filled 2021-06-25: qty 2
  Filled 2021-06-25: qty 1

## 2021-06-25 MED ORDER — CLOPIDOGREL BISULFATE 75 MG PO TABS: 75.0000 mg | ORAL_TABLET | Freq: Every day | ORAL | Status: DC

## 2021-06-25 MED ORDER — CLOPIDOGREL BISULFATE 75 MG PO TABS
75.0000 mg | ORAL_TABLET | Freq: Every day | ORAL | Status: DC
  Administered 2021-06-25: 75 mg via ORAL
  Filled 2021-06-25: qty 1

## 2021-06-25 MED ORDER — CLOPIDOGREL BISULFATE 75 MG PO TABS
75.0000 mg | ORAL_TABLET | Freq: Every day | ORAL | Status: DC
  Administered 2021-06-26 – 2021-07-01 (×6): 75 mg via ORAL
  Filled 2021-06-25 (×6): qty 1

## 2021-06-25 NOTE — Progress Notes (Signed)
Carotid artery duplex completed. Refer to "CV Proc" under chart review to view preliminary results.  06/25/2021 10:37 AM Kelby Aline., MHA, RVT, RDCS, RDMS

## 2021-06-25 NOTE — Progress Notes (Signed)
Echocardiogram 2D Echocardiogram has been performed.  Oneal Deputy Dilraj Killgore RDCS 06/25/2021, 11:00 AM

## 2021-06-25 NOTE — Assessment & Plan Note (Signed)
Neurology is aware order eeg

## 2021-06-25 NOTE — ED Notes (Signed)
Placed pt on condom cath, changed diaper, chux. Provided pt new blankets

## 2021-06-25 NOTE — Telephone Encounter (Signed)
See note

## 2021-06-25 NOTE — Evaluation (Signed)
Occupational Therapy Evaluation Patient Details Name: David Ford MRN: [id number] DOB: [birth year] Today's Date: 06/25/2021   History of Present Illness David Ford is a 81 y.o. male admitted 06/23/21 with confusion, weakness and seizure-like activity MRI demonstrated a punctate small acute left frontal lobe white matter infarct. PMH significant for diabetes, hypertension, hyperlipidemia, CKD stage III, history of prostate cancer and cognitive impairment   Clinical Impression   Pt with frequent recent hospitalizations, in December he was independent in ADL and mobility, driving and managing medicines and finances, today he presents with deficits in balance, safety awareness, activity tolerance. For further details see ADL and transfers section below. requires skilled OT in the acute setting and afterwards at the SNF level to maximize safety and independence in ADL and functional transfers.  Next session to focus on OOB activity with functional grooming in standing and further cognitive assessment with standardized tests as indicated in goals.      Recommendations for follow up therapy are one component of a multi-disciplinary discharge planning process, led by the attending physician.  Recommendations may be updated based on patient status, additional functional criteria and insurance authorization.   Follow Up Recommendations  Skilled nursing-short term rehab (<3 hours/day)    Assistance Recommended at Discharge Frequent or constant Supervision/Assistance  Patient can return home with the following A little help with walking and/or transfers;A little help with bathing/dressing/bathroom;Assistance with cooking/housework;Assistance with feeding;Direct supervision/assist for medications management;Direct supervision/assist for financial management;Assist for transportation;Help with stairs or ramp for entrance    Functional Status Assessment  Patient has had a recent decline in their functional  status and demonstrates the ability to make significant improvements in function in a reasonable and predictable amount of time.  Equipment Recommendations  None recommended by OT (Pt has appropriate DME)    Recommendations for Other Services PT consult     Precautions / Restrictions Precautions Precautions: Fall Restrictions Weight Bearing Restrictions: No      Mobility Bed Mobility Overal bed mobility: Needs Assistance Bed Mobility: Supine to Sit;Sit to Supine     Supine to sit: Mod assist Sit to supine: Min assist   General bed mobility comments: Required assist for LE management and scooting hips to EOB. Increased time required and multimodal cues for sequencing.    Transfers Overall transfer level: Needs assistance Equipment used: 1 person hand held assist Transfers: Sit to/from Stand Sit to Stand: Min assist           General transfer comment: Min A for lift assist and steadying      Balance Overall balance assessment: Needs assistance Sitting-balance support: No upper extremity supported;Feet supported Sitting balance-Leahy Scale: Fair     Standing balance support: Single extremity supported Standing balance-Leahy Scale: Poor Standing balance comment: Reliant on UE and external support                           ADL either performed or assessed with clinical judgement   ADL Overall ADL's : Needs assistance/impaired Eating/Feeding: Set up;Sitting   Grooming: Minimal assistance;Standing Grooming Details (indicate cue type and reason): fatigues quickly Upper Body Bathing: Set up;Sitting   Lower Body Bathing: Minimal assistance;Sitting/lateral leans   Upper Body Dressing : Min guard;Sitting   Lower Body Dressing: Moderate assistance;Sit to/from stand   Toilet Transfer: Moderate assistance;Ambulation Toilet Transfer Details (indicate cue type and reason): one person HHA with assist for turns and cues for safety Toileting- Clothing  Manipulation and Hygiene: Minimal assistance;Sit  to/from stand   Tub/ Banker: Moderate assistance   Functional mobility during ADLs: Minimal assistance;Moderate assistance (one person HHA)       Vision Baseline Vision/History: 3 Glaucoma       Perception     Praxis      Pertinent Vitals/Pain Pain Assessment: No/denies pain     Hand Dominance Right   Extremity/Trunk Assessment Upper Extremity Assessment Upper Extremity Assessment: Generalized weakness   Lower Extremity Assessment Lower Extremity Assessment: Defer to PT evaluation   Cervical / Trunk Assessment Cervical / Trunk Assessment: Normal   Communication Communication Communication: No difficulties   Cognition Arousal/Alertness: Awake/alert Behavior During Therapy: Flat affect Overall Cognitive Status: No family/caregiver present to determine baseline cognitive functioning                                 General Comments: Cognitive impairment at baseline. Very flat throughout     General Comments  no family present during eval    Exercises     Shoulder Instructions      Home Living Family/patient expects to be discharged to:: Private residence Living Arrangements: Spouse/significant other Available Help at Discharge: Family;Available PRN/intermittently Type of Home: House Home Access: Stairs to enter [employer] of Steps: 2 Entrance Stairs-Rails: Right Home Layout: Two level Alternate Level Stairs-Number of Steps: full flight   Bathroom Shower/Tub: Occupational psychologist: Standard     Home Equipment: Grab bars - tub/shower;Rolling Environmental consultant (2 wheels)          Prior Functioning/Environment Prior Level of Function : Independent/Modified Independent;Driving;Patient poor historian/Family not available             Mobility Comments: Pt reports he has been using RW at home          OT Problem List: Decreased activity tolerance;Decreased  safety awareness;Decreased knowledge of use of DME or AE;Impaired balance (sitting and/or standing)      OT Treatment/Interventions: Self-care/ADL training;Therapeutic exercise;Therapeutic activities;Cognitive remediation/compensation;DME and/or AE instruction;Patient/family education;Balance training    OT Goals(Current goals can be found in the care plan section) Acute Rehab OT Goals Patient Stated Goal: get home OT Goal Formulation: With patient Time For Goal Achievement: 07/09/21 Potential to Achieve Goals: Good ADL Goals Pt Will Perform Grooming: with supervision;standing Pt Will Perform Upper Body Dressing: with modified independence;sitting Pt Will Perform Lower Body Dressing: with supervision;sit to/from stand Pt Will Transfer to Toilet: with supervision;ambulating Pt Will Perform Toileting - Clothing Manipulation and hygiene: with supervision;sit to/from stand Additional ADL Goal #1: Pt will demonstrate improved mentation by scoring <4/10 on short blessed test and answering 4/4 safety questions from the KELS correctly: 1. What do you do for yourself if you are sick with a cold. 2. What do you do if you burn yourself and the wound becomes infected. 3. What do you do if you experience severe chest pain and shortness of breath? 4. What number do you call in an emergency?  OT Frequency: Min 2X/week    Co-evaluation PT/OT/SLP Co-Evaluation/Treatment: Yes Reason for Co-Treatment: For patient/therapist safety;To address functional/ADL transfers PT goals addressed during session: Mobility/safety with mobility;Balance OT goals addressed during session: ADL's and self-care;Strengthening/ROM      AM-PAC OT "6 Clicks" Daily Activity     Outcome Measure Help from another person eating meals?: None Help from another person taking care of personal grooming?: A Little Help from another person toileting, which includes using toliet, bedpan,  or urinal?: A Little Help from another person bathing  (including washing, rinsing, drying)?: A Lot Help from another person to put on and taking off regular upper body clothing?: A Little Help from another person to put on and taking off regular lower body clothing?: A Lot 6 Click Score: 17   End of Session Equipment Utilized During Treatment: Gait belt Nurse Communication: Mobility status;Precautions  Activity Tolerance: Patient tolerated treatment well Patient left: in bed;Other (comment) (going to vascular with transport)  OT Visit Diagnosis: Unsteadiness on feet (R26.81);Other symptoms and signs involving cognitive function                Time: 0955-1009 OT Time Calculation (min): 14 min Charges:  OT General Charges $OT Visit: 1 Visit OT Evaluation $OT Eval Moderate Complexity: [place] OTR/L Acute Rehabilitation Services Pager: [phone] Office: [place] 06/25/2021, 2:17 PM

## 2021-06-25 NOTE — Consult Note (Signed)
Consultation Note Date: 06/25/2021   Patient Name: David Ford  DOB: August 01, 1940  MRN: [id number]  Age / Sex: 81 y.o., male  PCP: David Barrack, MD Referring Physician: Toy Baker, MD  Reason for Consultation: Establishing goals of care  HPI/Patient Profile: 81 y.o. male  with past medical history of DM2, HTN, CKD stage IIIb (baseline creatinine 1.7), dementia, and prostate cancer who presented to the emergency department on 06/23/2021 with increased confusion, aphasia, and seizure like activity. No further seizure activity in the ER. MRI showed punctuate acute infarct in the left frontal lobe and remote lacunar infarct in left basal ganglia and moderate chronic microvascular disease. Admitted to [facility] for CVA and acute encephalopathy.   Clinical Assessment and Goals of Care: I have reviewed medical records including EPIC notes, labs and imaging, and went to see the patient at bedside. He is alert, oriented to self only. He is able to tell me his wife's name is David Ford. He has no acute complaints. Technician is in the room to start continuous EEG.   I spoke with his wife/David Ford by phone to discuss diagnosis, prognosis, GOC, EOL wishes, disposition, and options.I introduced Palliative Medicine as specialized medical care for people living with serious illness. It focuses on providing relief from the symptoms and stress of a serious illness.   We discussed a brief life review of the patient. He was born in [place], and relocated to [place] in his 20's for education and job opportunities. He has a Statistician in Chemistry. He later relocated to the [place] for work. He is divorced from his first wife. Together, they had 1 son David Ford and 1 daughter David Ford. David Ford is patient's second wife and they have been married for 4 years. They live together in their home in [facility].   Discussed that  patient was previously hospitalized 12/27 - 12/30 with hypoglycemia, which resolved with decreased dose of insulin. With regard to functional status, David Ford reports he has been declining for the past 3 weeks. He has been unable to take care of himself and is incontinent. She reports it has been increasingly difficult to care for him in his current condition. She shares that she 2 works 2 jobs and is in school. She also reports that patient's children do not really help.   We discussed patient's current illness and what it means in the larger context of his ongoing co-morbidities. Discussed the natural disease trajectory of a sudden neurological injury such as stroke. Provided education that prognostication can be challenging due to the uncertainty predicting neurologic outcome as well as the time required to allow for optimal recovery.  Discussed the possibility that patient has dementia underlying the his current acute medical conditions. David Ford reports they have an appointment with neurology scheduled 1/16. I provided education that dementia is a progressive and non-curable illness. Discussed that over time, it results in decreased mobility and functional status, decreased oral intake, and decreased ability to communicate.    Discussed that patient had been evaluated by PT/OT and  the recommendation was for SNF/rehab. Discussed that the goal of rehab is improvement in functional status, which can be challenging for patients with cognitive deficits. Discussed that patient may or may not return to previous baseline. David Ford has already been exploring options such as home health or assisted living.   The difference between full scope medical intervention and comfort care was considered. We did discuss code status. Recommended DNR/DNI status understanding evidenced based poor outcomes in similar hospitalized patients, as the cause of the arrest is likely associated with chronic/terminal disease rather than a  reversible acute cardio-pulmonary event. I explained that DNR/DNI is a protective measure to keep [place] from harming the patient in their last moments of life. David Ford seems to indicate that she would agree that DNR is appropriate however she would like time to think about that decision.  Questions and concerns were addressed. I emailed David Ford a copy of "hard choices" book and a black MOST form to review.    Primary decision maker: wife David Ford    SUMMARY OF RECOMMENDATIONS   Full code full scope  Wife is hopeful for improvement but understands patient may or may not return to previous baseline Wife is unable to care for patient in his current condition  Plan likely for SNF/rehab PMT will continue to follow   Code Status/Advance Care Planning: Full code  Prognosis:  Unable to determine  Discharge Planning: To Be Determined      Primary Diagnoses: Present on Admission:  CVA (cerebral vascular accident) ([place])  Hypertension associated with diabetes ([place])  Hyperlipidemia associated with type 2 diabetes mellitus ([place])  CKD (chronic kidney disease), stage III ([facility])  Glaucoma  Abdominal distension  Chronic diastolic CHF (congestive heart failure) ([place])  Acute metabolic encephalopathy   I have reviewed the medical record, interviewed the patient and family, and examined the patient. The following aspects are pertinent.  Past Medical History:  Diagnosis Date   AKI (acute kidney injury) ([place]) 06/18/2015   Cancer ([place])    hx of prostate cancer   Chronic kidney disease    Diabetes mellitus without complication ([place])    DKA (diabetic ketoacidoses) 06/18/2015   Glaucoma    Hyperlipidemia    Hypertension       Scheduled Meds:   stroke: mapping our early stages of recovery book   Does not apply Once   aspirin EC  81 mg Oral Daily   atorvastatin  80 mg Oral Daily   brimonidine  1 drop Both Eyes BID   [START ON 06/26/2021] clopidogrel  75 mg Oral Daily    dorzolamide-timolol  1 drop Both Eyes BID   insulin aspart  0-9 Units Subcutaneous Q4H   latanoprost  1 drop Both Eyes QHS   pantoprazole  40 mg Oral Daily   tamsulosin  0.4 mg Oral Daily   Continuous Infusions:  sodium chloride 75 mL (06/25/21 1142)   PRN Meds:.acetaminophen **OR** acetaminophen (TYLENOL) oral liquid 160 mg/5 mL **OR** acetaminophen, hydrOXYzine, traZODone   No Known Allergies Review of Systems  Unable to perform ROS: Dementia   Physical Exam Vitals reviewed.  Constitutional:      General: He is not in acute distress.    Comments: Chronically ill-appearing  Pulmonary:     Effort: Pulmonary effort is normal.  Neurological:     Mental Status: He is alert.     Motor: Weakness present.     Comments: Oriented to self only  Psychiatric:        Cognition and  Memory: Cognition is impaired.    Vital Signs: BP (!) 192/88    Pulse 76    Temp (!) 97.5 F (36.4 C)    Resp 16    SpO2 98%  Pain Scale: 0-10   Pain Score: 0-No pain   SpO2: SpO2: 98 % O2 Device:SpO2: 98 % O2 Flow Rate: .O2 Flow Rate (L/min): 0 L/min      Palliative Assessment/Data: PPS 40%     Time Total: 80 minutes  Greater than 50%  of this time was spent counseling and coordinating care related to the above assessment and plan.  Signed by: Lavena Bullion, NP   Please contact Palliative Medicine Team phone at [phone] for questions and concerns.  For individual provider: See Shea Cavins

## 2021-06-25 NOTE — Assessment & Plan Note (Signed)
-   most likely multifactorial secondary to combination of   mild dehydration secondary to decreased by mouth intake,  polypharmacy dementia sundowing  - Will rehydrate   - look for underlining infection CXR neg UA pening  - Hold contributing medications   -  MRI of the brain showing small cva but doe not explain degree of confusion  - neurological exam appears to be nonfocal but patient unable to cooperate fully

## 2021-06-25 NOTE — Telephone Encounter (Signed)
Ok with me. Please place any necessary orders. 

## 2021-06-25 NOTE — ED Notes (Signed)
Pt states he needs to leave and go home. Dr Cruzita Lederer made aware. Pt states he needs to get to his wife. Pt informed wife is not here, that he is advised to stay.

## 2021-06-25 NOTE — Evaluation (Signed)
Physical Therapy Evaluation Patient Details Name: David Ford MRN: [id number] DOB: 03-18-1941 Today's Date: 06/25/2021  History of Present Illness  Conall Vangorder is a 81 y.o. male admitted 06/23/21 with confusion, weakness and seizure-like activity MRI demonstrated a punctate small acute left frontal lobe white matter infarct. PMH significant for diabetes, hypertension, hyperlipidemia, CKD stage III, history of prostate cancer and cognitive impairment  Clinical Impression  Pt admitted secondary to problem above with deficits below. Pt requiring min A for steadying and pt with LOB X2-3 when performing dynamic gait tasks and required external assist to maintain balance. Pt was previously very independent. Recommending SNF level therapies at d/c to address current deficits. Will continue to follow acutely.        Recommendations for follow up therapy are one component of a multi-disciplinary discharge planning process, led by the attending physician.  Recommendations may be updated based on patient status, additional functional criteria and insurance authorization.  Follow Up Recommendations Skilled nursing-short term rehab (<3 hours/day)    Assistance Recommended at Discharge Frequent or constant Supervision/Assistance  Patient can return home with the following  A little help with walking and/or transfers;A little help with bathing/dressing/bathroom    Equipment Recommendations None recommended by PT  Recommendations for Other Services       Functional Status Assessment Patient has had a recent decline in their functional status and demonstrates the ability to make significant improvements in function in a reasonable and predictable amount of time.     Precautions / Restrictions Precautions Precautions: Fall Restrictions Weight Bearing Restrictions: No      Mobility  Bed Mobility Overal bed mobility: Needs Assistance Bed Mobility: Supine to Sit;Sit to Supine     Supine to sit:  Mod assist Sit to supine: Min assist   General bed mobility comments: Required assist for LE management and scooting hips to EOB. Increased time required and multimodal cues for sequencing.    Transfers Overall transfer level: Needs assistance Equipment used: 1 person hand held assist Transfers: Sit to/from Stand Sit to Stand: Min assist           General transfer comment: Min A for lift assist and steadying    Ambulation/Gait Ambulation/Gait assistance: Min assist;Min guard Gait Distance (Feet): 50 Feet Assistive device: 1 person hand held assist Gait Pattern/deviations: Step-through pattern;Decreased stride length Gait velocity: Decreased     General Gait Details: Unsteady and with LOB X2-3 when performing head turns and when turning. Required min A for stability.  Stairs            Wheelchair Mobility    Modified Rankin (Stroke Patients Only)       Balance Overall balance assessment: Needs assistance Sitting-balance support: No upper extremity supported;Feet supported Sitting balance-Leahy Scale: Fair     Standing balance support: Single extremity supported Standing balance-Leahy Scale: Poor Standing balance comment: Reliant on UE and external support                             Pertinent Vitals/Pain Pain Assessment: No/denies pain    Home Living Family/patient expects to be discharged to:: Private residence Living Arrangements: Spouse/significant other Available Help at Discharge: Family;Available PRN/intermittently Type of Home: House Home Access: Stairs to enter Entrance Stairs-Rails: Right Entrance Stairs-Number of Steps: 2 Alternate Level Stairs-Number of Steps: full flight Home Layout: Two level Home Equipment: Grab bars - tub/shower;Rolling Walker (2 wheels)      Prior Function Prior Level of  Function : Independent/Modified Independent;Driving;Patient poor historian/Family not available             Mobility Comments:  Pt reports he has been using RW at home       Hand Dominance        Extremity/Trunk Assessment   Upper Extremity Assessment Upper Extremity Assessment: Defer to OT evaluation    Lower Extremity Assessment Lower Extremity Assessment: Generalized weakness    Cervical / Trunk Assessment Cervical / Trunk Assessment: Normal  Communication   Communication: No difficulties  Cognition Arousal/Alertness: Awake/alert Behavior During Therapy: Flat affect Overall Cognitive Status: No family/caregiver present to determine baseline cognitive functioning                                 General Comments: Cognitive impairment at baseline. Very flat throughout        General Comments General comments (skin integrity, edema, etc.): No family present    Exercises     Assessment/Plan    PT Assessment Patient needs continued PT services  PT Problem List Decreased strength;Decreased mobility;Decreased activity tolerance;Decreased balance;Decreased knowledge of use of DME;Decreased cognition;Decreased knowledge of precautions;Decreased safety awareness       PT Treatment Interventions DME instruction;Gait training;Therapeutic activities;Therapeutic exercise;Patient/family education;Balance training;Functional mobility training    PT Goals (Current goals can be found in the Care Plan section)  Acute Rehab PT Goals Patient Stated Goal: none stated PT Goal Formulation: With patient Time For Goal Achievement: 07/09/21 Potential to Achieve Goals: Good    Frequency Min 2X/week     Co-evaluation PT/OT/SLP Co-Evaluation/Treatment: Yes Reason for Co-Treatment: For patient/therapist safety;To address functional/ADL transfers PT goals addressed during session: Balance;Mobility/safety with mobility         AM-PAC PT "6 Clicks" Mobility  Outcome Measure Help needed turning from your back to your side while in a flat bed without using bedrails?: A Little Help needed moving  from lying on your back to sitting on the side of a flat bed without using bedrails?: A Lot Help needed moving to and from a bed to a chair (including a wheelchair)?: A Little Help needed standing up from a chair using your arms (e.g., wheelchair or bedside chair)?: A Little Help needed to walk in hospital room?: A Little Help needed climbing 3-5 steps with a railing? : Total 6 Click Score: 15    End of Session Equipment Utilized During Treatment: Gait belt Activity Tolerance: Patient tolerated treatment well Patient left: in bed (in bed in ED hallway with transport staff present) Nurse Communication: Mobility status PT Visit Diagnosis: Unsteadiness on feet (R26.81);Muscle weakness (generalized) (M62.81)    Time: 4665-9935 PT Time Calculation (min) (ACUTE ONLY): 13 min   Charges:   PT Evaluation $PT Eval Moderate Complexity: 1 Mod          Reuel Derby, PT, DPT  Acute Rehabilitation Services  Pager: [phone] Office: [phone]   Rudean Hitt 06/25/2021, 1:18 PM

## 2021-06-25 NOTE — Progress Notes (Signed)
PROGRESS NOTE  David Ford GGY:[id number] DOB: [birth year] DOA: 06/23/2021 PCP: [name], MD   LOS: 1 day   Brief Narrative / Interim history: 81 year old male with DM2, HTN, dementia, prior history of prostate cancer, recurrent hospitalizations for hypoglycemia most recent fall few weeks ago, comes into the hospital for increased confusion, aphasia, and seizure-like activity.  This is been going on for the past 24 hours and he was brought to the hospital.  Wife reported an episode where he grabbed the side of the table repeatedly few times and was shaking at that time.  Neurology consulted and he was admitted to the hospital.  Primary imaging showed acute CVA.  Subjective / 24h Interval events: Seen in the ED, seems to be doing well this morning, remains confused at times, not sure why he is here  Assessment & Plan: Principal Problem Acute CVA-MRI on admission showed punctate acute infarct in the left frontal lobe as well as remote lacunar infarct in left basal ganglia moderate chronic microvascular disease along with cerebral atrophy.  Neurology consulted and following.  She will undergo CVA work-up, 2D echo done this morning and pending.  Carotid Doppler without significant stenosis.  MRI of the brain negative for LVO.  Lipid panel shows an LDL of 154, continue atorvastatin.  A1c 7.2.  PT/OT evaluation pending.  Currently on aspirin and Plavix  Active Problems Concern for seizures-EEG pending  Acute metabolic encephalopathy, underlying dementia-remains confused this morning, EEG pending  Essential hypertension-allow permissive hypertension, gradually normalize  Chronic kidney disease, stage IIIb-creatinine ranging between 1.6-2.2, currently at baseline  Hyperlipidemia-continue statin  DM2-continue sliding scale  CBG (last 3)  Recent Labs    06/25/21 0355 06/25/21 0758 06/25/21 1134  GLUCAP 164* 179* 168*    Glaucoma -Continue home meds   Chronic diastolic CHF  (congestive heart failure) (HCC) -euvolemic   BPH-continue Flomax  Scheduled Meds:   stroke: mapping our early stages of recovery book   Does not apply Once   aspirin EC  81 mg Oral Daily   atorvastatin  80 mg Oral Daily   brimonidine  1 drop Both Eyes BID   clopidogrel  75 mg Oral Daily   dorzolamide-timolol  1 drop Both Eyes BID   insulin aspart  0-9 Units Subcutaneous Q4H   latanoprost  1 drop Both Eyes QHS   pantoprazole  40 mg Oral Daily   tamsulosin  0.4 mg Oral Daily   Continuous Infusions:  sodium chloride 75 mL/hr at 06/25/21 0800   PRN Meds:.acetaminophen **OR** acetaminophen (TYLENOL) oral liquid 160 mg/5 mL **OR** acetaminophen, perflutren lipid microspheres (DEFINITY) IV suspension, traZODone  Diet Orders (From admission, onward)     Start     Ordered   06/25/21 0825  Diet Carb Modified Fluid consistency: Thin; Room service appropriate? Yes  Diet effective now       Question Answer Comment  Diet-HS Snack? Nothing   Calorie Level Medium 1600-2000   Fluid consistency: Thin   Room service appropriate? Yes      06/25/21 0824            DVT prophylaxis: SCD's Start: 06/24/21 2150     Code Status: Full Code  Family Communication: No family at bedside  Status is: Inpatient  Remains inpatient appropriate because: Persistent encephalopathy, CVA work-up pending, suspect needs SNF  Level of care: Telemetry Medical  Consultants:  Neurology   Procedures:  2D echo: pending  Microbiology  none  Antimicrobials: none    Objective: Vitals:  06/24/21 1637 06/24/21 1909 06/25/21 0407 06/25/21 0759  BP:  (!) 160/82 (!) 166/80 (!) 191/84  Pulse:  98 64 63  Resp:  20 18 16   Temp:    98.4 F (36.9 C)  TempSrc:      SpO2: 100% 100% 96% 97%    Intake/Output Summary (Last 24 hours) at 06/25/2021 1136 Last data filed at 06/25/2021 0800 Gross per 24 hour  Intake 1056.09 ml  Output --  Net 1056.09 ml   There were no vitals filed for this  visit.  Examination:  Constitutional: NAD Eyes: no scleral icterus ENMT: Mucous membranes are moist.  Neck: normal, supple Respiratory: clear to auscultation bilaterally, no wheezing, no crackles. Normal respiratory effort.  Cardiovascular: Regular rate and rhythm, no murmurs / rubs / gallops. No LE edema.  Abdomen: non distended, no tenderness. Bowel sounds positive.  Musculoskeletal: no clubbing / cyanosis.  Skin: no rashes Neurologic: No significant focal deficits  Data Reviewed: I have independently reviewed following labs and imaging studies   CBC: Recent Labs  Lab 06/23/21 1243 06/23/21 1308 06/24/21 2325  WBC 5.7  --  6.9  NEUTROABS 3.3  --  3.6  HGB 13.3 14.3 13.0  HCT 42.2 42.0 39.0  MCV 90.6  --  87.2  PLT 364  --  989   Basic Metabolic Panel: Recent Labs  Lab 06/23/21 1243 06/23/21 1308 06/24/21 2325  NA 134* 133* 132*  K 4.7 4.7 4.5  CL 102 102 102  CO2 22  --  23  GLUCOSE 288* 288* 240*  BUN 21 23 23   CREATININE 1.91* 1.60* 1.75*  CALCIUM 9.1  --  9.1  MG  --   --  1.9  PHOS  --   --  3.3   Liver Function Tests: Recent Labs  Lab 06/23/21 1243 06/24/21 2325  AST 19 17  ALT 16 14  ALKPHOS 74 78  BILITOT 0.7 0.5  PROT 6.7 6.2*  ALBUMIN 3.1* 3.1*   Coagulation Profile: Recent Labs  Lab 06/23/21 1243  INR 0.9   HbA1C: Recent Labs    06/24/21 2325  HGBA1C 7.2*   CBG: Recent Labs  Lab 06/24/21 1305 06/24/21 2338 06/25/21 0355 06/25/21 0758 06/25/21 1134  GLUCAP 263* 239* 164* 179* 168*    Recent Results (from the past 240 hour(s))  Resp Panel by RT-PCR (Flu A&B, Covid) Nasopharyngeal Swab     Status: None   Collection Time: 06/15/21  9:03 PM   Specimen: Nasopharyngeal Swab; Nasopharyngeal(NP) swabs in vial transport medium  Result Value Ref Range Status   SARS Coronavirus 2 by RT PCR NEGATIVE NEGATIVE Final    Comment: (NOTE) SARS-CoV-2 target nucleic acids are NOT DETECTED.  The SARS-CoV-2 RNA is generally detectable in  upper respiratory specimens during the acute phase of infection. The lowest concentration of SARS-CoV-2 viral copies this assay can detect is 138 copies/mL. A negative result does not preclude SARS-Cov-2 infection and should not be used as the sole basis for treatment or other patient management decisions. A negative result may occur with  improper specimen collection/handling, submission of specimen other than nasopharyngeal swab, presence of viral mutation(s) within the areas targeted by this assay, and inadequate number of viral copies(<138 copies/mL). A negative result must be combined with clinical observations, patient history, and epidemiological information. The expected result is Negative.  Fact Sheet for Patients:  EntrepreneurPulse.com.au  Fact Sheet for Healthcare Providers:  IncredibleEmployment.be  This test is no t yet approved or cleared by the  [place] Architectural [employer] and  has been authorized for detection and/or diagnosis of SARS-CoV-2 by FDA under an Print production planner (EUA). This EUA will remain  in effect (meaning this test can be used) for the duration of the COVID-19 declaration under Section 564(b)(1) of the Act, 21 U.S.C.section 360bbb-3(b)(1), unless the authorization is terminated  or revoked sooner.       Influenza A by PCR NEGATIVE NEGATIVE Final   Influenza B by PCR NEGATIVE NEGATIVE Final    Comment: (NOTE) The Xpert Xpress SARS-CoV-2/FLU/RSV plus assay is intended as an aid in the diagnosis of influenza from Nasopharyngeal swab specimens and should not be used as a sole basis for treatment. Nasal washings and aspirates are unacceptable for Xpert Xpress SARS-CoV-2/FLU/RSV testing.  Fact Sheet for Patients: EntrepreneurPulse.com.au  Fact Sheet for Healthcare Providers: IncredibleEmployment.be  This test is not yet approved or cleared by the [place] FDA and has been  authorized for detection and/or diagnosis of SARS-CoV-2 by FDA under an Emergency Use Authorization (EUA). This EUA will remain in effect (meaning this test can be used) for the duration of the COVID-19 declaration under Section 564(b)(1) of the Act, 21 U.S.C. section 360bbb-3(b)(1), unless the authorization is terminated or revoked.  Performed at [facility], [place] [address] [place], [place] [address]   Resp Panel by RT-PCR (Flu A&B, Covid) Nasopharyngeal Swab     Status: None   Collection Time: 06/23/21 12:43 PM   Specimen: Nasopharyngeal Swab; Nasopharyngeal(NP) swabs in vial transport medium  Result Value Ref Range Status   SARS Coronavirus 2 by RT PCR NEGATIVE NEGATIVE Final    Comment: (NOTE) SARS-CoV-2 target nucleic acids are NOT DETECTED.  The SARS-CoV-2 RNA is generally detectable in upper respiratory specimens during the acute phase of infection. The lowest concentration of SARS-CoV-2 viral copies this assay can detect is 138 copies/mL. A negative result does not preclude SARS-Cov-2 infection and should not be used as the sole basis for treatment or other patient management decisions. A negative result may occur with  improper specimen collection/handling, submission of specimen other than nasopharyngeal swab, presence of viral mutation(s) within the areas targeted by this assay, and inadequate number of viral copies(<138 copies/mL). A negative result must be combined with clinical observations, patient history, and epidemiological information. The expected result is Negative.  Fact Sheet for Patients:  EntrepreneurPulse.com.au  Fact Sheet for Healthcare Providers:  IncredibleEmployment.be  This test is no t yet approved or cleared by the [place] FDA and  has been authorized for detection and/or diagnosis of SARS-CoV-2 by FDA under an Emergency Use Authorization (EUA). This EUA will remain  in effect  (meaning this test can be used) for the duration of the COVID-19 declaration under Section 564(b)(1) of the Act, 21 U.S.C.section 360bbb-3(b)(1), unless the authorization is terminated  or revoked sooner.       Influenza A by PCR NEGATIVE NEGATIVE Final   Influenza B by PCR NEGATIVE NEGATIVE Final    Comment: (NOTE) The Xpert Xpress SARS-CoV-2/FLU/RSV plus assay is intended as an aid in the diagnosis of influenza from Nasopharyngeal swab specimens and should not be used as a sole basis for treatment. Nasal washings and aspirates are unacceptable for Xpert Xpress SARS-CoV-2/FLU/RSV testing.  Fact Sheet for Patients: EntrepreneurPulse.com.au  Fact Sheet for Healthcare Providers: IncredibleEmployment.be  This test is not yet approved or cleared by the [place] FDA and has been authorized for detection and/or diagnosis of SARS-CoV-2 by FDA under an Emergency Use Authorization (EUA).  This EUA will remain in effect (meaning this test can be used) for the duration of the COVID-19 declaration under Section 564(b)(1) of the Act, 21 U.S.C. section 360bbb-3(b)(1), unless the authorization is terminated or revoked.  Performed at [facility], [place] [address] [place], [place] [address]      Radiology Studies: DG Chest 1 View  Result Date: 06/24/2021 CLINICAL DATA:  Seizure, stroke EXAM: CHEST  1 VIEW COMPARISON:  06/15/2021 FINDINGS: Single frontal view of the chest demonstrates an unremarkable cardiac silhouette. No airspace disease, effusion, or pneumothorax. No acute bony abnormality. IMPRESSION: 1. No acute intrathoracic process. Electronically Signed   By: Randa Ngo M.D.   On: 06/24/2021 22:40   DG Abd 1 View  Result Date: 06/24/2021 CLINICAL DATA:  Seizure, stroke EXAM: ABDOMEN - 1 VIEW COMPARISON:  07/17/2015 FINDINGS: Supine frontal views of the abdomen and pelvis are obtained. Hemidiaphragms are excluded by collimation. Bowel gas  pattern is unremarkable without obstruction or ileus. No masses or abnormal calcifications. Radiotherapy seeds and surgical clips are seen within the prostate bed. Penile prosthesis is noted. No acute bony abnormalities. IMPRESSION: 1. Unremarkable bowel gas pattern. Electronically Signed   By: Randa Ngo M.D.   On: 06/24/2021 22:41   MR ANGIO HEAD WO CONTRAST  Result Date: 06/25/2021 CLINICAL DATA:  81 year old male with suspected punctate anterior left frontal lobe white matter infarct on recent brain MRI. EXAM: MRA HEAD WITHOUT CONTRAST TECHNIQUE: Angiographic images of the Circle of Willis were acquired using MRA technique without intravenous contrast. COMPARISON:  Brain MRI 06/24/2021. FINDINGS: Posterior circulation: Antegrade flow in the posterior circulation with dominant distal right vertebral artery. Normal left PICA origin. Left vertebral is diminutive beyond PICA. Right AICA appears dominant and patent. No distal vertebral stenosis. Patent basilar artery without stenosis. Patent SCA and left PCA origins. Fetal right PCA origin. Left posterior communicating artery diminutive or absent. Left PCA mild T2 and moderate P3 superior division stenoses (series 1041, image 4). Some right PCA MOTSA artifact is suspected but the right PCA appears diffusely attenuated beginning in the P2 segment. No definite right PCA occlusion. Anterior circulation: Antegrade flow in both ICA siphons. Siphon irregularity in keeping with atherosclerosis but no significant siphon stenosis. Ophthalmic and right posterior communicating artery origins are normal. Patent carotid termini. Mild stenosis of both ACA origins (series 1029, image 9). Otherwise anterior communicating artery and visible right ACA branches are within normal limits. There is a severe distal left A2 stenosis with preserved distal flow (series 1029, image 4). MCA M1 segments and MCA bifurcations are patent. No stenosis on the left, visible left MCA branches  are within normal limits. Mild right MCA M1 irregularity and stenosis. Mild irregularity at the right MCA bifurcation without significant stenosis. Visible right MCA branches are within normal limits. Anatomic variants: Dominant right vertebral artery. Fetal right PCA origin. Other: No intracranial mass effect, midline shift, or ventriculomegaly. IMPRESSION: 1. Negative for large vessel occlusion. 2. Positive for intracranial atherosclerosis. Moderate to severe stenoses of the Left ACA distal A2, Left PCA P3, and generalized attenuated appearance of the distal Right PCA. Electronically Signed   By: Genevie Ann M.D.   On: 06/25/2021 08:12   MR Brain W and Wo Contrast  Result Date: 06/24/2021 CLINICAL DATA:  Seizure, new-onset, no history of trauma EXAM: MRI HEAD WITHOUT AND WITH CONTRAST TECHNIQUE: Multiplanar, multiecho pulse sequences of the brain and surrounding structures were obtained without and with intravenous contrast. CONTRAST:  43mL GADAVIST GADOBUTROL 1  MMOL/ML IV SOLN COMPARISON:  Same day CT head. FINDINGS: Motion limited study. Brain: Punctate acute infarct in the left frontal lobe (series 3 and 300, image 30). No mass effect. Remote lacunar infarct in the left basal ganglia. Additional patchy T2/FLAIR hyperintensity within the white matter, nonspecific but compatible with chronic microvascular ischemic disease. Generalized atrophy. No evidence of acute hemorrhage, hydrocephalus, mass lesion, midline shift, or extra-axial fluid collection. The hippocampi appear to be within normal limits and symmetric in size/signal on the mildly limited coronal thin sequences. No evidence of abnormal enhancement. Vascular: Major arterial flow voids are maintained skull base. Skull and upper cervical spine: Normal marrow signal. Sinuses/Orbits: Mild paranasal sinus mucosal thickening. Unremarkable orbits. Other: No mastoid effusions. IMPRESSION: 1. Punctate acute infarct in the left frontal lobe. 2. Remote lacunar  infarct in left basal ganglia and moderate chronic microvascular disease. 3.  Cerebral atrophy (ICD10-G31.9). Electronically Signed   By: Margaretha Sheffield M.D.   On: 06/24/2021 18:49   VAS US CAROTID (at [facility] and [facility] only)  Result Date: 06/25/2021 Carotid Arterial Duplex Study Patient Name:  Loanne Drilling  Date of Exam:   06/25/2021 Medical Rec #: [id number]      Accession #:    [id number] Date of Birth: 1940-12-01      Patient Gender: M Patient Age:   65 years Exam Location:  [facility] Procedure:      VAS US CAROTID Referring Phys: Nyoka Lint DOUTOVA --------------------------------------------------------------------------------  Indications:  CVA. Risk Factors: Hypertension, hyperlipidemia, Diabetes. Performing Technologist: Maudry Mayhew MHA, RDMS, RVT, RDCS  Examination Guidelines: A complete evaluation includes B-mode imaging, spectral Doppler, color Doppler, and power Doppler as needed of all accessible portions of each vessel. Bilateral testing is considered an integral part of a complete examination. Limited examinations for reoccurring indications may be performed as noted.  Right Carotid Findings: +----------+-------+-------+--------+------------------------+-----------------+             PSV     EDV     Stenosis Plaque Description       Comments                       cm/s    cm/s                                                         +----------+-------+-------+--------+------------------------+-----------------+  CCA Prox   65                                                intimal                                                                          thickening         +----------+-------+-------+--------+------------------------+-----------------+  CCA Distal 55      10               focal and heterogenous                      +----------+-------+-------+--------+------------------------+-----------------+  ICA Prox   60      15               irregular and                                                                     heterogenous                                +----------+-------+-------+--------+------------------------+-----------------+  ICA Distal 67      15                                                           +----------+-------+-------+--------+------------------------+-----------------+  ECA        72                                                                   +----------+-------+-------+--------+------------------------+-----------------+ +----------+--------+-------+----------------+-------------------+             PSV cm/s EDV cms Describe         Arm Pressure (mmHG)  +----------+--------+-------+----------------+-------------------+  Subclavian 122              Multiphasic, WNL                      +----------+--------+-------+----------------+-------------------+ +---------+--------+--+--------+-+---------+  Vertebral PSV cm/s 39 EDV cm/s 8 Antegrade  +---------+--------+--+--------+-+---------+  Left Carotid Findings: +----------+--------+--------+--------+--------------------------+--------+             PSV cm/s EDV cm/s Stenosis Plaque Description         Comments  +----------+--------+--------+--------+--------------------------+--------+  CCA Prox   95       13                                                     +----------+--------+--------+--------+--------------------------+--------+  CCA Distal 54       6                 focal and heterogenous               +----------+--------+--------+--------+--------------------------+--------+  ICA Prox   62       14                heterogenous and irregular           +----------+--------+--------+--------+--------------------------+--------+  ICA Distal 58       13                                                     +----------+--------+--------+--------+--------------------------+--------+  ECA        69                                                               +----------+--------+--------+--------+--------------------------+--------+ +----------+--------+--------+----------------+-------------------+             PSV cm/s EDV cm/s Describe         Arm Pressure (mmHG)  +----------+--------+--------+----------------+-------------------+  Subclavian 153               Multiphasic, WNL                      +----------+--------+--------+----------------+-------------------+ +---------+--------+--+--------+--+---------+  Vertebral PSV cm/s 40 EDV cm/s 10 Antegrade  +---------+--------+--+--------+--+---------+   Summary: Right Carotid: Velocities in the right ICA are consistent with a 1-39% stenosis. Left Carotid: Velocities in the left ICA are consistent with a 1-39% stenosis. Vertebrals:  Bilateral vertebral arteries demonstrate antegrade flow. Subclavians: Normal flow hemodynamics were seen in bilateral subclavian              arteries. *See table(s) above for measurements and observations.     Preliminary      Marzetta Board, MD, PhD [facility]  Between 7 am - 7 pm I am available, please contact me via Amion (for emergencies) or Securechat (non urgent messages)  Between 7 pm - 7 am I am not available, please contact night coverage MD/APP via Amion

## 2021-06-25 NOTE — Procedures (Signed)
Patient Name: David Ford  MRN: [id number]  Epilepsy Attending: Lora Havens  Referring Physician/Provider: Dr Donnetta Simpers Date: 06/25/2021 Duration: 23.38 mins  Patient history: 81yo  with AMS, incidental stroke. EEG to evaluate for seizure  Level of alertness: Awake  AEDs during EEG study: None  Technical aspects: This EEG study was done with scalp electrodes positioned according to the 10-20 International system of electrode placement. Electrical activity was acquired at a sampling rate of 500Hz  and reviewed with a high frequency filter of 70Hz  and a low frequency filter of 1Hz . EEG data were recorded continuously and digitally stored.   Description: EEG showed continuous generalized predominantly 5 to 6 Hz theta slowing admixed with 2-3hz  delta slowing. Hyperventilation and photic stimulation were not performed.     ABNORMALITY - Continuous slow, generalized  IMPRESSION: This study is suggestive of moderate diffuse encephalopathy, nonspecific etiology. No seizures or epileptiform discharges were seen throughout the recording.  Ilai Hiller Barbra Sarks

## 2021-06-25 NOTE — Progress Notes (Signed)
Patient in hallway at this time with no option for a room. Will attempt EEG when patient is in a room.

## 2021-06-25 NOTE — Telephone Encounter (Signed)
He is currently in the hospital.  We will see him after he is discharged.  [name]. Jerline Pain, MD 06/25/2021 3:51 PM

## 2021-06-25 NOTE — Progress Notes (Signed)
EEG complete - results pending 

## 2021-06-25 NOTE — Progress Notes (Signed)
STROKE TEAM PROGRESS NOTE   SUBJECTIVE (INTERVAL HISTORY) No family are at the bedside.  Patient came in last pm with c/o confusion an episode of agitation when he was holding on to a table and fell backwards with some jerking with breathing hard. Left side of body was weak afterwards. MRI brain showed a small punctate acute left frontal lobe white matter infarct. He is admitted for stroke w/up.   This am, he denies any weakness, numbness/tingling, dysphagia, dysarthria, aphasia, or HA. Since admission, his LDL came back high at 154. A!c 7.2%. Echocardiogram is pending. Vascular carotid US with 1-39% stenosis in bilateral ICAs.   OBJECTIVE Vitals:   06/24/21 1909 06/25/21 0407 06/25/21 0759 06/25/21 1141  BP: (!) 160/82 (!) 166/80 (!) 191/84 (!) 184/68  Pulse: 98 64 63 (!) 59  Resp: 20 18 16 16   Temp:   98.4 F (36.9 C)   TempSrc:      SpO2: 100% 96% 97% 97%    CBC:  Recent Labs  Lab 06/23/21 1243 06/23/21 1308 06/24/21 2325  WBC 5.7  --  6.9  NEUTROABS 3.3  --  3.6  HGB 13.3 14.3 13.0  HCT 42.2 42.0 39.0  MCV 90.6  --  87.2  PLT 364  --  678    Basic Metabolic Panel:  Recent Labs  Lab 06/23/21 1243 06/23/21 1308 06/24/21 2325  NA 134* 133* 132*  K 4.7 4.7 4.5  CL 102 102 102  CO2 22  --  23  GLUCOSE 288* 288* 240*  BUN 21 23 23   CREATININE 1.91* 1.60* 1.75*  CALCIUM 9.1  --  9.1  MG  --   --  1.9  PHOS  --   --  3.3    Lipid Panel:  Recent Labs  Lab 06/25/21 0400  CHOL 202*  TRIG 71  HDL 34*  CHOLHDL 5.9  VLDL 14  LDLCALC 154*   HgbA1c:  Lab Results  Component Value Date   HGBA1C 7.2 (H) 06/24/2021   Urine Drug Screen:     Component Value Date/Time   LABOPIA NONE DETECTED 06/18/2015 0117   COCAINSCRNUR NONE DETECTED 06/18/2015 0117   LABBENZ NONE DETECTED 06/18/2015 0117   AMPHETMU NONE DETECTED 06/18/2015 0117   THCU NONE DETECTED 06/18/2015 0117   LABBARB NONE DETECTED 06/18/2015 0117    Alcohol Level     Component Value Date/Time    ETH <10 06/23/2021 1243    IMAGING  Results for orders placed or performed during the hospital encounter of 06/23/21  MR Brain W and Wo Contrast   Narrative   CLINICAL DATA:  Seizure, new-onset, no history of trauma  EXAM: MRI HEAD WITHOUT AND WITH CONTRAST  TECHNIQUE: Multiplanar, multiecho pulse sequences of the brain and surrounding structures were obtained without and with intravenous contrast.  CONTRAST:  59mL GADAVIST GADOBUTROL 1 MMOL/ML IV SOLN  COMPARISON:  Same day CT head.  FINDINGS: Motion limited study.  Brain: Punctate acute infarct in the left frontal lobe (series 3 and 300, image 30). No mass effect. Remote lacunar infarct in the left basal ganglia. Additional patchy T2/FLAIR hyperintensity within the white matter, nonspecific but compatible with chronic microvascular ischemic disease. Generalized atrophy. No evidence of acute hemorrhage, hydrocephalus, mass lesion, midline shift, or extra-axial fluid collection. The hippocampi appear to be within normal limits and symmetric in size/signal on the mildly limited coronal thin sequences. No evidence of abnormal enhancement.  Vascular: Major arterial flow voids are maintained skull base.  Skull and  upper cervical spine: Normal marrow signal.  Sinuses/Orbits: Mild paranasal sinus mucosal thickening. Unremarkable orbits.  Other: No mastoid effusions.  IMPRESSION: 1. Punctate acute infarct in the left frontal lobe. 2. Remote lacunar infarct in left basal ganglia and moderate chronic microvascular disease. 3.  Cerebral atrophy (ICD10-G31.9).   Electronically Signed   By: Margaretha Sheffield M.D.   On: 06/24/2021 18:49   MR ANGIO HEAD WO CONTRAST   Narrative   CLINICAL DATA:  81 year old male with suspected punctate anterior left frontal lobe white matter infarct on recent brain MRI.  EXAM: MRA HEAD WITHOUT CONTRAST  TECHNIQUE: Angiographic images of the Circle of Willis were acquired using  MRA technique without intravenous contrast.  COMPARISON:  Brain MRI 06/24/2021.  FINDINGS: Posterior circulation: Antegrade flow in the posterior circulation with dominant distal right vertebral artery. Normal left PICA origin. Left vertebral is diminutive beyond PICA. Right AICA appears dominant and patent. No distal vertebral stenosis. Patent basilar artery without stenosis. Patent SCA and left PCA origins. Fetal right PCA origin. Left posterior communicating artery diminutive or absent.  Left PCA mild T2 and moderate P3 superior division stenoses (series 1041, image 4). Some right PCA MOTSA artifact is suspected but the right PCA appears diffusely attenuated beginning in the P2 segment. No definite right PCA occlusion.  Anterior circulation: Antegrade flow in both ICA siphons. Siphon irregularity in keeping with atherosclerosis but no significant siphon stenosis. Ophthalmic and right posterior communicating artery origins are normal. Patent carotid termini.  Mild stenosis of both ACA origins (series 1029, image 9). Otherwise anterior communicating artery and visible right ACA branches are within normal limits. There is a severe distal left A2 stenosis with preserved distal flow (series 1029, image 4).  MCA M1 segments and MCA bifurcations are patent. No stenosis on the left, visible left MCA branches are within normal limits. Mild right MCA M1 irregularity and stenosis. Mild irregularity at the right MCA bifurcation without significant stenosis. Visible right MCA branches are within normal limits.  Anatomic variants: Dominant right vertebral artery. Fetal right PCA origin.  Other: No intracranial mass effect, midline shift, or ventriculomegaly.  IMPRESSION: 1. Negative for large vessel occlusion. 2. Positive for intracranial atherosclerosis. Moderate to severe stenoses of the Left ACA distal A2, Left PCA P3, and generalized attenuated appearance of the distal Right  PCA.   Electronically Signed   By: Genevie Ann M.D.   On: 06/25/2021 08:12   CT Head Wo Contrast   Narrative   CLINICAL DATA:  Mental status change, unknown cause. Generalized weakness.  EXAM: CT HEAD WITHOUT CONTRAST  TECHNIQUE: Contiguous axial images were obtained from the base of the skull through the vertex without intravenous contrast.  COMPARISON:  01/05/2020  FINDINGS: Brain: Stable cerebral atrophy. No evidence for acute hemorrhage, mass lesion, midline shift, hydrocephalus or large infarct. Stable low-density in the periventricular and subcortical white matter is suggestive for chronic changes. Stable low-density area in the left basal ganglia could represent an old lacune infarct.  Vascular: Calcification involving the right vertebral artery.  Skull: Normal. Negative for fracture or focal lesion.  Sinuses/Orbits: Possible polyp or retention cyst in the right maxillary sinus but incompletely imaged. Mild disease in left maxillary sinus.  Other: None.  IMPRESSION: 1. No acute intracranial abnormality. 2. Stable atrophy and evidence for chronic small vessel ischemic changes.   Electronically Signed   By: Markus Daft M.D.   On: 06/23/2021 13:17     PHYSICAL EXAM: BP 191/84. T 98.44f. HR 63, RR  16. SaO2 97% on RA.  Gen: Well appearing, chronically ill appearing elderly African-American male lying comfortably on ED bed.  CV: RRR on tele.  Resp: normal respiratory effort with no increased WOB.  Abd: Soft, NT.  Extremities: well perfused. Psych: Quiet, cooperative, calm.   NEURO:  Mental Status: AA&Ox3  Speech/Language: speech is without dysarthria or aphasia.  Naming, repetition, fluency, and comprehension intact.  Diminished attention, registration and recall.  Increased reaction time and slow to answer questions.  Cranial Nerves:  II: PERRL. Visual fields full.         III, IV, VI: EOMI. Eyelids elevate symmetrically.  V: Sensation is intact to light  touch and symmetrical to face.  VII: Smile is symmetrical. Able to puff cheeks and raise eyebrows.  VIII: hearing intact to voice. IX, X: Palate elevates symmetrically. Phonation is normal.  YP:PJKDTOIZ shrug 5/5. XII: tongue is midline without fasciculations. Motor: 5/5 strength to all muscle groups tested.  Tone: is normal and bulk is normal Sensation- Intact to light touch bilaterally. Extinction absent to DSS.    Coordination: FTN intact bilaterally, HKS: no ataxia in BLE. No drift.  DTRs: 2+ throughout Gait- deferred  NIHSS:  1a Level of Conscious.: 0 1b LOC Questions: 0 1c LOC Commands: 0 2 Best Gaze: 0 3 Visual: 0 4 Facial Palsy: 0 5a Motor Arm - left: 0 5b Motor Arm - Right: 0 6a Motor Leg - Left: 0 6b Motor Leg - Right: 0 7 Limb Ataxia: 0 8 Sensory: 0 9 Best Language: 0 10 Dysarthria: 0 11 Extinct and Inattention.: 0 TOTAL: 0  ASSESSMENT/PLAN Mr. Sequoyah Counterman is a 81 y.o. male with history of  prostate cancer, CKD III, and cognitive impairment, DM II, HLD, HTN, GERD, Glaucoma, and BPH who presented with confusion, ? seizure activity at home and increasing decline in independence in ADLs.   Stroke: left frontal punctate infarct secondary to small vessel disease source.  Confusion unlikely to be explained by such a small lacunar stroke which is likely incidental CT head  MRI head Punctate acute infarct in the left frontal lobe. Remote lacunar infarct in left basal ganglia and moderate chronic microvascular disease. Cerebral atrophy (ICD10-G31.9). MRA headNegative for large vessel occlusion. Positive for evidence of moderate to severe stenosis.  Carotid Doppler without significant stenosis.  Echo read is pending.  LDL 154 HgbA1c 7.2 SCDs forVTE prophylaxis  No antiplatelet or anticoagulation prior to admission, now on Plavix 75 mg po qd x 90 days with ASA 81mg  po qd x 21 days, then ASA alone forever.  Patient counseled to be compliant with his antithrombotic  medications Ongoing aggressive stroke risk factor management Therapy recommendations:  PT/OT  due to decline in ability to care for himself at home.   Disposition: per PT/OT.   Hypertension His BP  on admission was controlled, now increased due to permissive HTN.  Permissive hypertension (OK if < 220/120) but gradually normalize in 5-7 days Long-term BP goal normotensive-<130/90  Hyperlipidemia Home meds: Lipitor 10mg  po qd. Increased dose to 80mg  po qd and suggest recheck lipids by PCP in 3 months.  LDL 154 goal < 70 Continue statin at increased dosage at discharge  Diabetes type II HgbA1c 7.2,  goal < 7.0 Fairly well controlled, per PCP.   Other Stroke Risk Factors Advanced age Remote stroke Family hx stroke in mother.  Chronic small vessel ischemic disease.   Other Active Problems Concern for seizures. EEG is pending.   Hospital day # 1  Clance Boll, MSN, APN-BC Neurology Nurse Practitioner Pager [phone]  STROKE MD NOTE :  I have personally obtained history,examined this patient, reviewed notes, independently viewed imaging studies, participated in medical decision making and plan of care.ROS completed by me personally and pertinent positives fully documented  I have made any additions or clarifications directly to the above note. Agree with note above.  Patient presented with confusion which is of unclear etiology but MRI scan shows tiny punctate lacunar stroke which is not likely to explain the confusion is likely incidental underlying small vessel disease.  Recommend aspirin Plavix for 3 weeks followed by aspirin alone and aggressive risk factor modification.  Continue with stroke work-up.  Physical occupational and speech therapy consults.  Discussed with Dr.Gherghe.  Greater than 50% time during this 50-minute visit was spent on counseling and coordination of care about lacunar stroke and discussion about stroke evaluation, prevention and answering  questions.  Antony Contras, MD Medical Director [facility] Pager: [phone] 06/25/2021 4:07 PM   To contact Stroke Continuity provider, please refer to [email or web address] After hours, contact General Neurology

## 2021-06-26 DIAGNOSIS — I63522 Cerebral infarction due to unspecified occlusion or stenosis of left anterior cerebral artery: Secondary | ICD-10-CM

## 2021-06-26 LAB — BASIC METABOLIC PANEL
Anion gap: 8 (ref 5–15)
BUN: 22 mg/dL (ref 8–23)
CO2: 23 mmol/L (ref 22–32)
Calcium: 9 mg/dL (ref 8.9–10.3)
Chloride: 105 mmol/L (ref 98–111)
Creatinine, Ser: 1.81 mg/dL — ABNORMAL HIGH (ref 0.61–1.24)
GFR, Estimated: 38 mL/min — ABNORMAL LOW (ref >60)
Glucose, Bld: 147 mg/dL — ABNORMAL HIGH (ref 70–99)
Potassium: 4.2 mmol/L (ref 3.5–5.1)
Sodium: 136 mmol/L (ref 135–145)

## 2021-06-26 LAB — URINALYSIS, ROUTINE W REFLEX MICROSCOPIC
Bilirubin Urine: NEGATIVE
Glucose, UA: NEGATIVE mg/dL
Hgb urine dipstick: NEGATIVE
Ketones, ur: NEGATIVE mg/dL
Leukocytes,Ua: NEGATIVE
Nitrite: NEGATIVE
Protein, ur: 100 mg/dL — AB
Specific Gravity, Urine: 1.02 (ref 1.005–1.030)
pH: 5.5 (ref 5.0–8.0)

## 2021-06-26 LAB — CBC
HCT: 38.9 % — ABNORMAL LOW (ref 39.0–52.0)
Hemoglobin: 13 g/dL (ref 13.0–17.0)
MCH: 29 pg (ref 26.0–34.0)
MCHC: 33.4 g/dL (ref 30.0–36.0)
MCV: 86.6 fL (ref 80.0–100.0)
Platelets: 364 10*3/uL (ref 150–400)
RBC: 4.49 MIL/uL (ref 4.22–5.81)
RDW: 13.2 % (ref 11.5–15.5)
WBC: 7.8 10*3/uL (ref 4.0–10.5)
nRBC: 0 % (ref 0.0–0.2)

## 2021-06-26 LAB — GLUCOSE, CAPILLARY
Glucose-Capillary: 163 mg/dL — ABNORMAL HIGH (ref 70–99)
Glucose-Capillary: 171 mg/dL — ABNORMAL HIGH (ref 70–99)
Glucose-Capillary: 187 mg/dL — ABNORMAL HIGH (ref 70–99)
Glucose-Capillary: 223 mg/dL — ABNORMAL HIGH (ref 70–99)
Glucose-Capillary: 230 mg/dL — ABNORMAL HIGH (ref 70–99)
Glucose-Capillary: 257 mg/dL — ABNORMAL HIGH (ref 70–99)
Glucose-Capillary: 44 mg/dL — CL (ref 70–99)
Glucose-Capillary: 67 mg/dL — ABNORMAL LOW (ref 70–99)
Glucose-Capillary: 80 mg/dL (ref 70–99)

## 2021-06-26 LAB — AMMONIA: Ammonia: 39 umol/L — ABNORMAL HIGH (ref 9–35)

## 2021-06-26 LAB — URINALYSIS, MICROSCOPIC (REFLEX): Bacteria, UA: NONE SEEN

## 2021-06-26 MED ORDER — ATORVASTATIN CALCIUM 40 MG PO TABS
40.0000 mg | ORAL_TABLET | Freq: Every day | ORAL | Status: DC
  Administered 2021-06-27 – 2021-07-01 (×5): 40 mg via ORAL
  Filled 2021-06-26 (×5): qty 1

## 2021-06-26 MED ORDER — ASPIRIN EC 325 MG PO TBEC
325.0000 mg | DELAYED_RELEASE_TABLET | Freq: Every day | ORAL | Status: DC
  Administered 2021-06-27 – 2021-07-01 (×5): 325 mg via ORAL
  Filled 2021-06-26 (×5): qty 1

## 2021-06-26 NOTE — Evaluation (Signed)
Speech Language Pathology Evaluation Patient Details Name: David Ford MRN: [id number] DOB: 07-13-1940 Today's Date: 06/26/2021 Time: 4098-1191 SLP Time Calculation (min) (ACUTE ONLY): 37 min  Problem List:  Patient Active Problem List   Diagnosis Date Noted   Seizure-like activity ([place]) 47/82/9562   Acute metabolic encephalopathy 13/01/6577   CVA (cerebral vascular accident) ([place]) 06/24/2021   Abdominal distension 06/24/2021   Chronic diastolic CHF (congestive heart failure) ([place]) 06/24/2021   Acute-on-chronic kidney injury ([place]) 06/16/2021   Anemia 06/16/2021   Hypoglycemia 06/15/2021   Dementia without behavioral disturbance ([place]) 06/08/2021   Esophagitis 01/06/2020   Dermatitis 08/20/2018   Glaucoma 01/31/2018   CKD (chronic kidney disease), stage III ([place]) 08/03/2017   Hyperlipidemia associated with type 2 diabetes mellitus ([place]) 06/02/2017   BPH (benign prostatic hyperplasia) 06/02/2017   History of prostate cancer 06/02/2017   Insomnia 06/02/2017   Hypertension associated with diabetes ([place]) 06/17/2015   T2DM (type 2 diabetes mellitus) ([place]) 06/17/2015   Past Medical History:  Past Medical History:  Diagnosis Date   AKI (acute kidney injury) ([place]) 06/18/2015   Cancer ([place])    hx of prostate cancer   Chronic kidney disease    Diabetes mellitus without complication ([place])    DKA (diabetic ketoacidoses) 06/18/2015   Glaucoma    Hyperlipidemia    Hypertension    Past Surgical History:  Past Surgical History:  Procedure Laterality Date   AMPUTATION Left 07/03/2015   Procedure: Left Foot 1st and 2nd Ray Amputation;  Surgeon: Newt Minion, MD;  Location: [place];  Service: Orthopedics;  Laterality: Left;   BASCILIC VEIN TRANSPOSITION Left 07/14/2015   Procedure: BRACHIOBASILIC VEIN TRANSPOSITION  ;  Surgeon: Elam Dutch, MD;  Location: [facility];  Service: Vascular;  Laterality: Left;   IRIDOTOMY / IRIDECTOMY     prostate seeds     HPI:  81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  Pt PMH + dementia, prostate cancer.  He denies cognitive deficits -   Assessment / Plan / Recommendation Clinical Impression  SLP administered portion of SLUMS ([place] Mental Status exam) however pt with baseline dementia - thus evaluation modified for functional assessment.  Pt oriented to self and situation but not location nor time.  He is able to follow one step directions with delay with 85% accuracy.  Pt is not communicating independently but rather answers SLP questions readily.  He showed awareness to his "confusion" as he informed his son he was "Confused and does not know why" on the phone- this was after he was informed of his CVA.  No focal CN deficits nor apraxia, dysarthria nor aphasia apparent.  Largest deficits currently appears to be attention, initiation and delayed processing of information.  SLP used written cues *paper* to help pt with orientation and to use call bell.  Will follow up for functional goals in current setting and to establish baseline cognitive linguistic function from family given h/o dementia.   Pt demonstrated use of signage with min verbal cues for cueing to help participate in his care plan.  Pt reports improvement with comprehension of current situation.    SLP Assessment  SLP Recommendation/Assessment: Patient needs continued Speech [place] Pathology Services SLP Visit Diagnosis: Cognitive communication deficit (R41.841)    Recommendations for follow up therapy are one component of a multi-disciplinary discharge planning process, led by the attending physician.  Recommendations may be updated based  on patient status, additional functional criteria and insurance authorization.    Follow Up Recommendations  Skilled nursing-short term rehab (<3 hours/day)    Assistance Recommended at Discharge  Frequent or constant Supervision/Assistance  Functional Status  Assessment Patient has had a recent decline in their functional status and demonstrates the ability to make significant improvements in function in a reasonable and predictable amount of time.  Frequency and Duration min 1 x/week  1 week      SLP Evaluation Cognition  Overall Cognitive Status: No family/caregiver present to determine baseline cognitive functioning (has dementia per hx in snapshot) Orientation Level: Oriented to person;Oriented to situation;Disoriented to place;Disoriented to time Year: 2023 Month: December Day of Week: Incorrect (Tuesday) Memory: Impaired Memory Impairment: Storage deficit;Retrieval deficit (pt recalled that SLP was bringing him water after 2 minutes but did not recall to use paper posted for orientation) Awareness: Impaired Awareness Impairment: Intellectual impairment Problem Solving: Impaired Problem Solving Impairment: Functional basic (locating call bell)       Comprehension  Auditory Comprehension Overall Auditory Comprehension: Impaired Yes/No Questions: Impaired Commands: Impaired One Step Basic Commands: 75-100% accurate Two Step Basic Commands: 50-74% accurate Conversation: Simple Interfering Components: Attention;Processing speed;Working Marine scientist;Motor planning EffectiveTechniques: Repetition;Increased volume Reading Comprehension Reading Status: Within funtional limits (for basic information)    Expression Expression Primary Mode of Expression: Verbal Verbal Expression Overall Verbal Expression: Appears within functional limits for tasks assessed Initiation: Impaired (pt does not initiate communication but able to respond when questioned) Level of Generative/Spontaneous Verbalization: Sentence Repetition: No impairment Naming: No impairment Pragmatics: No impairment Non-Verbal Means of Communication: Not applicable Written Expression Dominant Hand: Right Written Expression: Not tested   Oral / Motor  Oral Motor/Sensory  Function Overall Oral Motor/Sensory Function: Within functional limits Motor Speech Overall Motor Speech: Appears within functional limits for tasks assessed Respiration: Within functional limits Resonance: Within functional limits Articulation: Within functional limitis Intelligibility: Intelligible Motor Planning: Witnin functional limits Motor Speech Errors: Not applicable            Macario Golds 06/26/2021, 11:24 AM Kathleen Lime, MS [place] Office [phone] Cell [phone]

## 2021-06-26 NOTE — Progress Notes (Signed)
STROKE TEAM PROGRESS NOTE   SUBJECTIVE (INTERVAL HISTORY) RN at the bedside. Daughter on the phone. Pt sitting in bed for dinner. He is not orientated to age but orientated to place, time and people. He has mild perseveration and naming and mild left hand asterixis. MRI only show punctate infarct at left MCA/ACA border zone.   OBJECTIVE Vitals:   06/26/21 0430 06/26/21 0814 06/26/21 1015 06/26/21 1217  BP: (!) 165/100 (!) 165/85 (!) 173/93 (!) 174/85  Pulse: 67 77 81 72  Resp: 17 18 20 18   Temp: 97.7 F (36.5 C) 98.3 F (36.8 C) 98.6 F (37 C) 98.7 F (37.1 C)  TempSrc: Axillary  Oral Oral  SpO2: 100% 97% 98% 100%    CBC:  Recent Labs  Lab 06/23/21 1243 06/23/21 1308 06/24/21 2325 06/26/21 0217  WBC 5.7  --  6.9 7.8  NEUTROABS 3.3  --  3.6  --   HGB 13.3   < > 13.0 13.0  HCT 42.2   < > 39.0 38.9*  MCV 90.6  --  87.2 86.6  PLT 364  --  360 364   < > = values in this interval not displayed.    Basic Metabolic Panel:  Recent Labs  Lab 06/24/21 2325 06/26/21 0217  NA 132* 136  K 4.5 4.2  CL 102 105  CO2 23 23  GLUCOSE 240* 147*  BUN 23 22  CREATININE 1.75* 1.81*  CALCIUM 9.1 9.0  MG 1.9  --   PHOS 3.3  --     Lipid Panel:  Recent Labs  Lab 06/25/21 0400  CHOL 202*  TRIG 71  HDL 34*  CHOLHDL 5.9  VLDL 14  LDLCALC 154*   HgbA1c:  Lab Results  Component Value Date   HGBA1C 7.2 (H) 06/24/2021   Urine Drug Screen:     Component Value Date/Time   LABOPIA NONE DETECTED 06/18/2015 0117   COCAINSCRNUR NONE DETECTED 06/18/2015 0117   LABBENZ NONE DETECTED 06/18/2015 0117   AMPHETMU NONE DETECTED 06/18/2015 0117   THCU NONE DETECTED 06/18/2015 0117   LABBARB NONE DETECTED 06/18/2015 0117    Alcohol Level     Component Value Date/Time   ETH <10 06/23/2021 1243    IMAGING  Results for orders placed or performed during the hospital encounter of 06/23/21  MR Brain W and Wo Contrast   Narrative   CLINICAL DATA:  Seizure, new-onset, no history of  trauma  EXAM: MRI HEAD WITHOUT AND WITH CONTRAST  TECHNIQUE: Multiplanar, multiecho pulse sequences of the brain and surrounding structures were obtained without and with intravenous contrast.  CONTRAST:  5mL GADAVIST GADOBUTROL 1 MMOL/ML IV SOLN  COMPARISON:  Same day CT head.  FINDINGS: Motion limited study.  Brain: Punctate acute infarct in the left frontal lobe (series 3 and 300, image 30). No mass effect. Remote lacunar infarct in the left basal ganglia. Additional patchy T2/FLAIR hyperintensity within the white matter, nonspecific but compatible with chronic microvascular ischemic disease. Generalized atrophy. No evidence of acute hemorrhage, hydrocephalus, mass lesion, midline shift, or extra-axial fluid collection. The hippocampi appear to be within normal limits and symmetric in size/signal on the mildly limited coronal thin sequences. No evidence of abnormal enhancement.  Vascular: Major arterial flow voids are maintained skull base.  Skull and upper cervical spine: Normal marrow signal.  Sinuses/Orbits: Mild paranasal sinus mucosal thickening. Unremarkable orbits.  Other: No mastoid effusions.  IMPRESSION: 1. Punctate acute infarct in the left frontal lobe. 2. Remote lacunar infarct in  left basal ganglia and moderate chronic microvascular disease. 3.  Cerebral atrophy (ICD10-G31.9).   Electronically Signed   By: Margaretha Sheffield M.D.   On: 06/24/2021 18:49   MR ANGIO HEAD WO CONTRAST   Narrative   CLINICAL DATA:  81 year old male with suspected punctate anterior left frontal lobe white matter infarct on recent brain MRI.  EXAM: MRA HEAD WITHOUT CONTRAST  TECHNIQUE: Angiographic images of the Circle of Willis were acquired using MRA technique without intravenous contrast.  COMPARISON:  Brain MRI 06/24/2021.  FINDINGS: Posterior circulation: Antegrade flow in the posterior circulation with dominant distal right vertebral artery. Normal left  PICA origin. Left vertebral is diminutive beyond PICA. Right AICA appears dominant and patent. No distal vertebral stenosis. Patent basilar artery without stenosis. Patent SCA and left PCA origins. Fetal right PCA origin. Left posterior communicating artery diminutive or absent.  Left PCA mild T2 and moderate P3 superior division stenoses (series 1041, image 4). Some right PCA MOTSA artifact is suspected but the right PCA appears diffusely attenuated beginning in the P2 segment. No definite right PCA occlusion.  Anterior circulation: Antegrade flow in both ICA siphons. Siphon irregularity in keeping with atherosclerosis but no significant siphon stenosis. Ophthalmic and right posterior communicating artery origins are normal. Patent carotid termini.  Mild stenosis of both ACA origins (series 1029, image 9). Otherwise anterior communicating artery and visible right ACA branches are within normal limits. There is a severe distal left A2 stenosis with preserved distal flow (series 1029, image 4).  MCA M1 segments and MCA bifurcations are patent. No stenosis on the left, visible left MCA branches are within normal limits. Mild right MCA M1 irregularity and stenosis. Mild irregularity at the right MCA bifurcation without significant stenosis. Visible right MCA branches are within normal limits.  Anatomic variants: Dominant right vertebral artery. Fetal right PCA origin.  Other: No intracranial mass effect, midline shift, or ventriculomegaly.  IMPRESSION: 1. Negative for large vessel occlusion. 2. Positive for intracranial atherosclerosis. Moderate to severe stenoses of the Left ACA distal A2, Left PCA P3, and generalized attenuated appearance of the distal Right PCA.   Electronically Signed   By: Genevie Ann M.D.   On: 06/25/2021 08:12   CT Head Wo Contrast   Narrative   CLINICAL DATA:  Mental status change, unknown cause. Generalized weakness.  EXAM: CT HEAD WITHOUT  CONTRAST  TECHNIQUE: Contiguous axial images were obtained from the base of the skull through the vertex without intravenous contrast.  COMPARISON:  01/05/2020  FINDINGS: Brain: Stable cerebral atrophy. No evidence for acute hemorrhage, mass lesion, midline shift, hydrocephalus or large infarct. Stable low-density in the periventricular and subcortical white matter is suggestive for chronic changes. Stable low-density area in the left basal ganglia could represent an old lacune infarct.  Vascular: Calcification involving the right vertebral artery.  Skull: Normal. Negative for fracture or focal lesion.  Sinuses/Orbits: Possible polyp or retention cyst in the right maxillary sinus but incompletely imaged. Mild disease in left maxillary sinus.  Other: None.  IMPRESSION: 1. No acute intracranial abnormality. 2. Stable atrophy and evidence for chronic small vessel ischemic changes.   Electronically Signed   By: Markus Daft M.D.   On: 06/23/2021 13:17     PHYSICAL EXAM:   Temp:  [97.6 F (36.4 C)-98.7 F (37.1 C)] 98.6 F (37 C) (01/07 1541) Pulse Rate:  [67-85] 72 (01/07 1541) Resp:  [17-20] 18 (01/07 1541) BP: (165-198)/(83-100) 184/90 (01/07 1541) SpO2:  [97 %-100 %] 99 % (01/07  1541)  General - Well nourished, well developed, in no apparent distress.  Ophthalmologic - fundi not visualized due to noncooperation.  Cardiovascular - Regular rhythm and rate.  Neuro - awake, alert, however psychomotor slowing. Eyes open, orientated to place, time and people, but not to age. No aphasia, paucity of speech, but following all simple commands, mild dysarthria. Able to name 2/3 then started to perseverate. Able to repeat simple sentences. No gaze palsy, tracking bilaterally, visual field full, PERRL. No facial droop. Tongue midline. Bilateral UEs 4/5, no drift. Bilaterally LEs proximal 3-/5 and distally 3/5. Sensation symmetrical bilaterally, b/l FTN intact grossly although  slow. He does have b/l hand asterixis L>R, gait not tested.     ASSESSMENT/PLAN Mr. Jacory Kamel is a 81 y.o. male with history of  prostate cancer, CKD III, and cognitive impairment, DM II, HLD, HTN, GERD, Glaucoma, and BPH who presented with confusion, ? seizure activity at home and increasing decline in independence in ADLs.   Encephalopathy  Mild disorientation on exam with mild asterixis and psychomotor slowing Could be related to AKI on CKD, high BP and mildly high ammonia level Can not be explained by the punctate infarct on MRI Management per primary team EEG no seizure Treat for underlying condition.   Stroke, incidental finding - left frontal punctate infarct secondary to large vessel disease source given severe left A2 stenosis CT head no acute finding MRI head Punctate acute infarct in the left frontal lobe. Remote lacunar infarct in left basal ganglia and moderate chronic microvascular disease.  MRA head Negative for large vessel occlusion. Positive for evidence of moderate to severe stenosis of L A2 and P3, right PCA  Carotid Doppler without significant stenosis.  Echo EF 45-50% LDL 154 HgbA1c 7.2 SCDs for VTE prophylaxis No antiplatelet or anticoagulation prior to admission, now on ASA 325 and Plavix 75 mg po qd x 3 months and then ASA alone given large vessel stenosis.  Patient counseled to be compliant with his antithrombotic medications Ongoing aggressive stroke risk factor management Therapy recommendations:  SNF Disposition: pending  Hypertension Stable  Long-term BP goal normotensive   Hyperlipidemia Home meds: Lipitor 10mg  po qd. Increased dose to 40mg  po qd   LDL 154 goal < 70 Continue statin at increased dosage at discharge  Diabetes type II, uncontrolled HgbA1c 7.2,  goal < 7.0 Uncontrolled CBG SSI Close follow up with PCP.   Other Stroke Risk Factors Advanced age Remote stroke on MRI Family hx stroke in mother.   Other Active  Problems Palliative Care Consult 06/25/2021 AKI on CKD IIIB, Cre 1.60->1.75->1.81   Hospital day # 2  Neurology will sign off. Please call with questions. Pt will follow up with stroke clinic NP at [facility] in about 4 weeks. Thanks for the consult.  Rosalin Hawking, MD PhD Stroke Neurology 06/27/2021 12:38 AM    To contact Stroke Continuity provider, please refer to [email or web address] After hours, contact General Neurology

## 2021-06-26 NOTE — TOC Progression Note (Signed)
Transition of Care [facility]) - Progression Note    Patient Details  Name: David Ford MRN: [id number] Date of Birth: [birth year]  [facility]) CM/SW [place], [place] Phone Number: 06/26/2021, 2:20 PM  Clinical Narrative:     CSW attempted to follow up with wife to discuss SNF recommendation as pt is disoriented. CSW was unable to leave a VM. FL2 will need to be updated from ALF when workup is done.TOC will continue to follow for DC needs.    Barriers to Discharge: No Barriers Identified  Expected Discharge Plan and Services                                                 Social Determinants of Health (SDOH) Interventions    Readmission Risk Interventions No flowsheet data found.

## 2021-06-26 NOTE — Progress Notes (Signed)
PROGRESS NOTE  David Ford GDJ:[id number] DOB: [birth year] DOA: 06/23/2021 PCP: [name], MD   LOS: 2 days   Brief Narrative / Interim history: 81 year old male with DM2, HTN, dementia, prior history of prostate cancer, recurrent hospitalizations for hypoglycemia most recent fall few weeks ago, comes into the hospital for increased confusion, aphasia, and seizure-like activity.  This is been going on for the past 24 hours and he was brought to the hospital.  Wife reported an episode where he grabbed the side of the table repeatedly few times and was shaking at that time.  Neurology consulted and he was admitted to the hospital.  Primary imaging showed acute CVA.  Subjective / 24h Interval events: No complaints for me, he knows he is in the hospital but does not know why.  Assessment & Plan: Principal Problem Acute CVA-MRI on admission showed punctate acute infarct in the left frontal lobe as well as remote lacunar infarct in left basal ganglia moderate chronic microvascular disease along with cerebral atrophy.  Neurology consulted and following.  She will undergo CVA work-up, 2D echo shows EF 62-22%, grade 1 diastolic dysfunction.  Carotid Doppler without significant stenosis.  MRI of the brain negative for LVO.  Lipid panel shows an LDL of 154, continue atorvastatin.  A1c 7.2.  PT/OT evaluation pending.  Currently on aspirin and Plavix, neurology following  Active Problems Concern for seizures-EEG negative for seizure activity  Acute metabolic encephalopathy, underlying dementia-mildly confused, suspect close to baseline  Essential hypertension-allow permissive hypertension, gradually normalize  Chronic kidney disease, stage IIIb-creatinine ranging between 1.6-2.2, currently at baseline 1.8  Hyperlipidemia-continue statin  DM2-continue sliding scale  CBG (last 3)  Recent Labs    06/26/21 0454 06/26/21 0813 06/26/21 1218  GLUCAP 80 171* 187*     Glaucoma -Continue home  meds   Chronic diastolic CHF (congestive heart failure) (HCC) -euvolemic   BPH-continue Flomax  Scheduled Meds:   stroke: mapping our early stages of recovery book   Does not apply Once   aspirin EC  81 mg Oral Daily   atorvastatin  80 mg Oral Daily   brimonidine  1 drop Both Eyes BID   clopidogrel  75 mg Oral Daily   dorzolamide-timolol  1 drop Both Eyes BID   insulin aspart  0-9 Units Subcutaneous Q4H   latanoprost  1 drop Both Eyes QHS   pantoprazole  40 mg Oral Daily   tamsulosin  0.4 mg Oral Daily   Continuous Infusions:  sodium chloride Stopped (06/25/21 1550)   PRN Meds:.acetaminophen **OR** acetaminophen (TYLENOL) oral liquid 160 mg/5 mL **OR** acetaminophen, hydrOXYzine, traZODone  Diet Orders (From admission, onward)     Start     Ordered   06/25/21 0825  Diet Carb Modified Fluid consistency: Thin; Room service appropriate? Yes  Diet effective now       Question Answer Comment  Diet-HS Snack? Nothing   Calorie Level Medium 1600-2000   Fluid consistency: Thin   Room service appropriate? Yes      06/25/21 0824            DVT prophylaxis: SCD's Start: 06/24/21 2150     Code Status: Full Code  Family Communication: No family at bedside  Status is: Inpatient  Remains inpatient appropriate because: Persistent encephalopathy, CVA work-up pending, suspect needs SNF  Level of care: Telemetry Medical  Consultants:  Neurology   Procedures:  2D echo: pending  Microbiology  none  Antimicrobials: none    Objective: Vitals:   06/26/21  0430 06/26/21 0814 06/26/21 1015 06/26/21 1217  BP: (!) 165/100 (!) 165/85 (!) 173/93 (!) 174/85  Pulse: 67 77 81 72  Resp: 17 18 20 18   Temp: 97.7 F (36.5 C) 98.3 F (36.8 C) 98.6 F (37 C) 98.7 F (37.1 C)  TempSrc: Axillary  Oral Oral  SpO2: 100% 97% 98% 100%    Intake/Output Summary (Last 24 hours) at 06/26/2021 1336 Last data filed at 06/26/2021 1032 Gross per 24 hour  Intake 744.49 ml  Output 700 ml   Net 44.49 ml    There were no vitals filed for this visit.  Examination:  Constitutional: He is in no distress Eyes: Anicteric ENMT: MMM Neck: normal, supple Respiratory: CTA bilaterally without wheezing or crackles Cardiovascular: Regular rate and rhythm, no edema Abdomen: Soft, NT, ND, bowel sounds positive Musculoskeletal: no clubbing / cyanosis.  Skin: No rashes Neurologic: Nonfocal  Data Reviewed: I have independently reviewed following labs and imaging studies   CBC: Recent Labs  Lab 06/23/21 1243 06/23/21 1308 06/24/21 2325 06/26/21 0217  WBC 5.7  --  6.9 7.8  NEUTROABS 3.3  --  3.6  --   HGB 13.3 14.3 13.0 13.0  HCT 42.2 42.0 39.0 38.9*  MCV 90.6  --  87.2 86.6  PLT 364  --  360 591    Basic Metabolic Panel: Recent Labs  Lab 06/23/21 1243 06/23/21 1308 06/24/21 2325 06/26/21 0217  NA 134* 133* 132* 136  K 4.7 4.7 4.5 4.2  CL 102 102 102 105  CO2 22  --  23 23  GLUCOSE 288* 288* 240* 147*  BUN 21 23 23 22   CREATININE 1.91* 1.60* 1.75* 1.81*  CALCIUM 9.1  --  9.1 9.0  MG  --   --  1.9  --   PHOS  --   --  3.3  --     Liver Function Tests: Recent Labs  Lab 06/23/21 1243 06/24/21 2325  AST 19 17  ALT 16 14  ALKPHOS 74 78  BILITOT 0.7 0.5  PROT 6.7 6.2*  ALBUMIN 3.1* 3.1*    Coagulation Profile: Recent Labs  Lab 06/23/21 1243  INR 0.9    HbA1C: Recent Labs    06/24/21 2325  HGBA1C 7.2*    CBG: Recent Labs  Lab 06/26/21 0426 06/26/21 0444 06/26/21 0454 06/26/21 0813 06/26/21 1218  GLUCAP 44* 67* 80 171* 187*     Recent Results (from the past 240 hour(s))  Resp Panel by RT-PCR (Flu A&B, Covid) Nasopharyngeal Swab     Status: None   Collection Time: 06/23/21 12:43 PM   Specimen: Nasopharyngeal Swab; Nasopharyngeal(NP) swabs in vial transport medium  Result Value Ref Range Status   SARS Coronavirus 2 by RT PCR NEGATIVE NEGATIVE Final    Comment: (NOTE) SARS-CoV-2 target nucleic acids are NOT DETECTED.  The  SARS-CoV-2 RNA is generally detectable in upper respiratory specimens during the acute phase of infection. The lowest concentration of SARS-CoV-2 viral copies this assay can detect is 138 copies/mL. A negative result does not preclude SARS-Cov-2 infection and should not be used as the sole basis for treatment or other patient management decisions. A negative result may occur with  improper specimen collection/handling, submission of specimen other than nasopharyngeal swab, presence of viral mutation(s) within the areas targeted by this assay, and inadequate number of viral copies(<138 copies/mL). A negative result must be combined with clinical observations, patient history, and epidemiological information. The expected result is Negative.  Fact Sheet for Patients:  EntrepreneurPulse.com.au  Fact Sheet for Healthcare Providers:  IncredibleEmployment.be  This test is no t yet approved or cleared by the [place] FDA and  has been authorized for detection and/or diagnosis of SARS-CoV-2 by FDA under an Emergency Use Authorization (EUA). This EUA will remain  in effect (meaning this test can be used) for the duration of the COVID-19 declaration under Section 564(b)(1) of the Act, 21 U.S.C.section 360bbb-3(b)(1), unless the authorization is terminated  or revoked sooner.       Influenza A by PCR NEGATIVE NEGATIVE Final   Influenza B by PCR NEGATIVE NEGATIVE Final    Comment: (NOTE) The Xpert Xpress SARS-CoV-2/FLU/RSV plus assay is intended as an aid in the diagnosis of influenza from Nasopharyngeal swab specimens and should not be used as a sole basis for treatment. Nasal washings and aspirates are unacceptable for Xpert Xpress SARS-CoV-2/FLU/RSV testing.  Fact Sheet for Patients: EntrepreneurPulse.com.au  Fact Sheet for Healthcare Providers: IncredibleEmployment.be  This test is not yet approved or  cleared by the [place] FDA and has been authorized for detection and/or diagnosis of SARS-CoV-2 by FDA under an Emergency Use Authorization (EUA). This EUA will remain in effect (meaning this test can be used) for the duration of the COVID-19 declaration under Section 564(b)(1) of the Act, 21 U.S.C. section 360bbb-3(b)(1), unless the authorization is terminated or revoked.  Performed at [facility], [place] [address], [place], [place] [address]       Radiology Studies: EEG adult  Result Date: Jun 29, 2021 Lora Havens, MD     06/29/21  6:50 PM Patient Name: David Ford MRN: [id number] Epilepsy Attending: Lora Havens Referring Physician/Provider: Dr Donnetta Simpers Date: 06-29-21 Duration: 23.38 mins Patient history: 81yo  with AMS, incidental stroke. EEG to evaluate for seizure Level of alertness: Awake AEDs during EEG study: None Technical aspects: This EEG study was done with scalp electrodes positioned according to the 10-20 International system of electrode placement. Electrical activity was acquired at a sampling rate of 500Hz  and reviewed with a high frequency filter of 70Hz  and a low frequency filter of 1Hz . EEG data were recorded continuously and digitally stored. Description: EEG showed continuous generalized predominantly 5 to 6 Hz theta slowing admixed with 2-3hz  delta slowing. Hyperventilation and photic stimulation were not performed.   ABNORMALITY - Continuous slow, generalized IMPRESSION: This study is suggestive of moderate diffuse encephalopathy, nonspecific etiology. No seizures or epileptiform discharges were seen throughout the recording. Priyanka Mont Dutton, MD, PhD [facility]  Between 7 am - 7 pm I am available, please contact me via Amion (for emergencies) or Securechat (non urgent messages)  Between 7 pm - 7 am I am not available, please contact night coverage MD/APP via Amion

## 2021-06-27 DIAGNOSIS — Z515 Encounter for palliative care: Secondary | ICD-10-CM

## 2021-06-27 DIAGNOSIS — Z7189 Other specified counseling: Secondary | ICD-10-CM

## 2021-06-27 LAB — GLUCOSE, CAPILLARY
Glucose-Capillary: 91 mg/dL (ref 70–99)
Glucose-Capillary: 112 mg/dL — ABNORMAL HIGH (ref 70–99)
Glucose-Capillary: 225 mg/dL — ABNORMAL HIGH (ref 70–99)
Glucose-Capillary: 289 mg/dL — ABNORMAL HIGH (ref 70–99)
Glucose-Capillary: 245 mg/dL — ABNORMAL HIGH (ref 70–99)

## 2021-06-27 LAB — BASIC METABOLIC PANEL
Anion gap: 7 (ref 5–15)
BUN: 20 mg/dL (ref 8–23)
CO2: 22 mmol/L (ref 22–32)
Calcium: 8.7 mg/dL — ABNORMAL LOW (ref 8.9–10.3)
Chloride: 105 mmol/L (ref 98–111)
Creatinine, Ser: 1.77 mg/dL — ABNORMAL HIGH (ref 0.61–1.24)
GFR, Estimated: 39 mL/min — ABNORMAL LOW (ref >60)
Glucose, Bld: 172 mg/dL — ABNORMAL HIGH (ref 70–99)
Potassium: 4 mmol/L (ref 3.5–5.1)
Sodium: 134 mmol/L — ABNORMAL LOW (ref 135–145)

## 2021-06-27 NOTE — Progress Notes (Signed)
PROGRESS NOTE  David Ford NGE:[id number] DOB: [birth year] DOA: 06/23/2021 PCP: [name], MD   LOS: 3 days   Brief Narrative / Interim history: 81 year old male with DM2, HTN, dementia, prior history of prostate cancer, recurrent hospitalizations for hypoglycemia most recent fall few weeks ago, comes into the hospital for increased confusion, aphasia, and seizure-like activity.  This is been going on for the past 24 hours and he was brought to the hospital.  Wife reported an episode where he grabbed the side of the table repeatedly few times and was shaking at that time.  Neurology consulted and he was admitted to the hospital.  Primary imaging showed acute CVA.  Subjective / 24h Interval events: No complaints, no chest pain, no shortness of breath.  Knows that he is the hospital but does not know why  Assessment & Plan: Principal Problem Acute CVA-MRI on admission showed punctate acute infarct in the left frontal lobe as well as remote lacunar infarct in left basal ganglia moderate chronic microvascular disease along with cerebral atrophy.  Neurology consulted and following.  She will undergo CVA work-up, 2D echo shows EF 40-10%, grade 1 diastolic dysfunction.  Carotid Doppler without significant stenosis.  MRI of the brain negative for LVO.  Lipid panel shows an LDL of 154, continue atorvastatin.  A1c 7.2.  PT/OT evaluation recommends SNF.  Social worker consulted.  Currently on aspirin 325 and Plavix, neurology recommends dual antiplatelet therapy for 3 months then aspirin alone  Active Problems Concern for seizures-EEG negative for seizure activity  Acute metabolic encephalopathy, underlying dementia-mildly confused, suspect close to baseline  Essential hypertension-allow permissive hypertension, gradually normalize  Chronic kidney disease, stage IIIb-creatinine ranging between 1.6-2.2, currently at baseline 1.8  Hyperlipidemia-continue statin  DM2-continue sliding scale  CBG  (last 3)  Recent Labs    06/26/21 2349 06/27/21 0411 06/27/21 0805  GLUCAP 230* 91 112*     Glaucoma -Continue home meds   Chronic diastolic CHF (congestive heart failure) (HCC) -euvolemic   BPH-continue Flomax  Scheduled Meds:   stroke: mapping our early stages of recovery book   Does not apply Once   aspirin EC  325 mg Oral Daily   atorvastatin  40 mg Oral Daily   brimonidine  1 drop Both Eyes BID   clopidogrel  75 mg Oral Daily   dorzolamide-timolol  1 drop Both Eyes BID   insulin aspart  0-9 Units Subcutaneous Q4H   latanoprost  1 drop Both Eyes QHS   pantoprazole  40 mg Oral Daily   tamsulosin  0.4 mg Oral Daily   Continuous Infusions:   PRN Meds:.acetaminophen **OR** acetaminophen (TYLENOL) oral liquid 160 mg/5 mL **OR** acetaminophen, hydrOXYzine, traZODone  Diet Orders (From admission, onward)     Start     Ordered   06/25/21 0825  Diet Carb Modified Fluid consistency: Thin; Room service appropriate? Yes  Diet effective now       Question Answer Comment  Diet-HS Snack? Nothing   Calorie Level Medium 1600-2000   Fluid consistency: Thin   Room service appropriate? Yes      06/25/21 0824            DVT prophylaxis: SCD's Start: 06/24/21 2150     Code Status: Full Code  Family Communication: No family at bedside  Status is: Inpatient  Remains inpatient appropriate because: Awaiting SNF placement  Level of care: Telemetry Medical  Consultants:  Neurology   Procedures:  2D echo  Microbiology  none  Antimicrobials: none  Objective: Vitals:   06/26/21 2350 06/27/21 0409 06/27/21 0716 06/27/21 1110  BP: (!) 151/89 (!) 151/78 (!) 150/72 (!) 172/73  Pulse: 66 66 72 79  Resp: 19 18 18 18   Temp: 98.4 F (36.9 C) 98 F (36.7 C) 98 F (36.7 C) 98.3 F (36.8 C)  TempSrc: Oral Oral    SpO2: 99% 99% 99% 100%    Intake/Output Summary (Last 24 hours) at 06/27/2021 1206 Last data filed at 06/27/2021 0805 Gross per 24 hour  Intake 240 ml   Output 1250 ml  Net -1010 ml    There were no vitals filed for this visit.  Examination:  Constitutional: NAD Eyes: No scleral icterus ENMT: mmm Neck: normal, supple Respiratory: Clear bilaterally, no wheezing, no crackles Cardiovascular: Regular rate and rhythm, no edema Abdomen: Soft, NT, ND, bowel sounds positive Musculoskeletal: no clubbing / cyanosis.  Skin: No rashes Neurologic: No focal deficits  Data Reviewed: I have independently reviewed following labs and imaging studies   CBC: Recent Labs  Lab 06/23/21 1243 06/23/21 1308 06/24/21 2325 06/26/21 0217  WBC 5.7  --  6.9 7.8  NEUTROABS 3.3  --  3.6  --   HGB 13.3 14.3 13.0 13.0  HCT 42.2 42.0 39.0 38.9*  MCV 90.6  --  87.2 86.6  PLT 364  --  360 161    Basic Metabolic Panel: Recent Labs  Lab 06/23/21 1243 06/23/21 1308 06/24/21 2325 06/26/21 0217 06/27/21 0139  NA 134* 133* 132* 136 134*  K 4.7 4.7 4.5 4.2 4.0  CL 102 102 102 105 105  CO2 22  --  23 23 22   GLUCOSE 288* 288* 240* 147* 172*  BUN 21 23 23 22 20   CREATININE 1.91* 1.60* 1.75* 1.81* 1.77*  CALCIUM 9.1  --  9.1 9.0 8.7*  MG  --   --  1.9  --   --   PHOS  --   --  3.3  --   --     Liver Function Tests: Recent Labs  Lab 06/23/21 1243 06/24/21 2325  AST 19 17  ALT 16 14  ALKPHOS 74 78  BILITOT 0.7 0.5  PROT 6.7 6.2*  ALBUMIN 3.1* 3.1*    Coagulation Profile: Recent Labs  Lab 06/23/21 1243  INR 0.9    HbA1C: Recent Labs    06/24/21 2325  HGBA1C 7.2*    CBG: Recent Labs  Lab 06/26/21 1550 06/26/21 2000 06/26/21 2349 06/27/21 0411 06/27/21 0805  GLUCAP 163* 257* 230* 91 112*     Recent Results (from the past 240 hour(s))  Resp Panel by RT-PCR (Flu A&B, Covid) Nasopharyngeal Swab     Status: None   Collection Time: 06/23/21 12:43 PM   Specimen: Nasopharyngeal Swab; Nasopharyngeal(NP) swabs in vial transport medium  Result Value Ref Range Status   SARS Coronavirus 2 by RT PCR NEGATIVE NEGATIVE Final     Comment: (NOTE) SARS-CoV-2 target nucleic acids are NOT DETECTED.  The SARS-CoV-2 RNA is generally detectable in upper respiratory specimens during the acute phase of infection. The lowest concentration of SARS-CoV-2 viral copies this assay can detect is 138 copies/mL. A negative result does not preclude SARS-Cov-2 infection and should not be used as the sole basis for treatment or other patient management decisions. A negative result may occur with  improper specimen collection/handling, submission of specimen other than nasopharyngeal swab, presence of viral mutation(s) within the areas targeted by this assay, and inadequate number of viral copies(<138 copies/mL). A negative result must  be combined with clinical observations, patient history, and epidemiological information. The expected result is Negative.  Fact Sheet for Patients:  EntrepreneurPulse.com.au  Fact Sheet for Healthcare Providers:  IncredibleEmployment.be  This test is no t yet approved or cleared by the [place] FDA and  has been authorized for detection and/or diagnosis of SARS-CoV-2 by FDA under an Emergency Use Authorization (EUA). This EUA will remain  in effect (meaning this test can be used) for the duration of the COVID-19 declaration under Section 564(b)(1) of the Act, 21 U.S.C.section 360bbb-3(b)(1), unless the authorization is terminated  or revoked sooner.       Influenza A by PCR NEGATIVE NEGATIVE Final   Influenza B by PCR NEGATIVE NEGATIVE Final    Comment: (NOTE) The Xpert Xpress SARS-CoV-2/FLU/RSV plus assay is intended as an aid in the diagnosis of influenza from Nasopharyngeal swab specimens and should not be used as a sole basis for treatment. Nasal washings and aspirates are unacceptable for Xpert Xpress SARS-CoV-2/FLU/RSV testing.  Fact Sheet for Patients: EntrepreneurPulse.com.au  Fact Sheet for Healthcare  Providers: IncredibleEmployment.be  This test is not yet approved or cleared by the [place] FDA and has been authorized for detection and/or diagnosis of SARS-CoV-2 by FDA under an Emergency Use Authorization (EUA). This EUA will remain in effect (meaning this test can be used) for the duration of the COVID-19 declaration under Section 564(b)(1) of the Act, 21 U.S.C. section 360bbb-3(b)(1), unless the authorization is terminated or revoked.  Performed at [facility], [place] [address], [place], [place] [address]       Radiology Studies: No results found.   Marzetta Board, MD, PhD [facility]  Between 7 am - 7 pm I am available, please contact me via Amion (for emergencies) or Securechat (non urgent messages)  Between 7 pm - 7 am I am not available, please contact night coverage MD/APP via Amion

## 2021-06-27 NOTE — Progress Notes (Signed)
Daily Progress Note   Patient Name: David Ford       Date: 06/27/2021 DOB: [birth year]  Age: 81 y.o. MRN#: [id number] Attending Physician: Caren Griffins, MD Primary Care Physician: [name], MD Admit Date: 06/23/2021    HPI/Patient Profile: 81 y.o. male  with past medical history of DM2, HTN, CKD stage IIIb (baseline creatinine 1.7), dementia, and prostate cancer who presented to the emergency department on 06/23/2021 with increased confusion, aphasia, and seizure like activity. No further seizure activity in the ER. MRI showed punctuate acute infarct in the left frontal lobe and remote lacunar infarct in left basal ganglia and moderate chronic microvascular disease. Admitted to [facility] for CVA and acute encephalopathy.   Subjective: I went to see patient at bedside. He is resting comfortably with no acute complaints. On my assessment, he is oriented to self only.  I spoke with his wife [name] by phone. I spent time reviewing patient's current status and test results. Discussed that EEG did not show seizure activity. Discussed that per neurology, patient's confusion cannot be explained by the punctuate infarct and could be related to AKI on CKD, high BP, and elevated ammonia level.   We also discussed the possibility of progressive dementia. Although he does not have a previous diagnosis of dementia, his clinical picture seems to suggest this. Discussed that he cannot be evaluated for dementia while in the hospital. [name] states that she has been suspecting he had dementia for some time. They are scheduled to see a neurologist later this month.   Briefly addressed the issue of code status. [name] seems to indicate she would agree DNR/DNI status is appropriate, but is not ready to make this  decision yet. She wants to speak with patient's children first.    Objective:   Physical Exam Vitals reviewed.  Constitutional:      General: He is not in acute distress. Pulmonary:     Effort: Pulmonary effort is normal.  Neurological:     Mental Status: He is alert.     Comments: Oriented to self only  Psychiatric:        Cognition and Memory: Cognition is impaired.            Vital Signs: BP (!) 169/80 (BP Location: Left Arm)    Pulse 75  Temp 98 F (36.7 C)    Resp 18    SpO2 97%  SpO2: SpO2: 97 % O2 Device: O2 Device: Room Air   LBM: Last BM Date: 06/26/21 Baseline Weight:   Most recent weight:         Palliative Assessment/Data: PPS 30-40%      Palliative Care Assessment & Plan   Assessment: - acute CVA - concern for seizure - acute metabolic encephalopathy - underlying dementia - CKD stage IIIb - chronic diastolic CHF  Recommendations/Plan: Full code full scope Plan for SNF/rehab Will complete MOST form prior to discharge PMT will continue to follow   Prognosis:  Unable to determine  Discharge Planning: SNF/rehab   Thank you for allowing the Palliative Medicine Team to assist in the care of this patient.   MDM - High due to: 1 or more chronic illnesses with severe exacerbation, progression, or side effects of treatment OR acute or chronic illness or injury that poses a threat to life or bodily function Review of prior external notes, review of test results, assessment requiring an independent historian Discussion/decision not to resuscitate or to de-escalate care because poor prognosis   Lavena Bullion, NP  Please contact Palliative Medicine Team phone at [phone] for questions and concerns.

## 2021-06-28 ENCOUNTER — Inpatient Hospital Stay: Payer: Medicare HMO | Admitting: Family Medicine

## 2021-06-28 LAB — GLUCOSE, CAPILLARY
Glucose-Capillary: 158 mg/dL — ABNORMAL HIGH (ref 70–99)
Glucose-Capillary: 190 mg/dL — ABNORMAL HIGH (ref 70–99)
Glucose-Capillary: 218 mg/dL — ABNORMAL HIGH (ref 70–99)
Glucose-Capillary: 251 mg/dL — ABNORMAL HIGH (ref 70–99)
Glucose-Capillary: 253 mg/dL — ABNORMAL HIGH (ref 70–99)
Glucose-Capillary: 287 mg/dL — ABNORMAL HIGH (ref 70–99)
Glucose-Capillary: 294 mg/dL — ABNORMAL HIGH (ref 70–99)

## 2021-06-28 NOTE — NC FL2 (Signed)
[place] LEVEL OF CARE SCREENING TOOL     IDENTIFICATION  Patient Name: David Ford Birthdate: 1940-07-12 Sex: male Admission Date (Current Location): 06/23/2021  [facility] and [place] Number:  Herbalist and Address:  [facility] [place], [place] [address] [place], [place] [address]      Provider Number: [id number]  Attending Physician Name and Address:  Caren Griffins, MD  Relative Name and Phone Number:  Lucious, Zou   [phone]    Current Level of Care: Hospital Recommended Level of Care: [place] Prior Approval Number:    Date Approved/Denied:   PASRR Number: [id number]  Discharge Plan: SNF    Current Diagnoses: Patient Active Problem List   Diagnosis Date Noted   Seizure-like activity ([place]) 84/69/6295   Acute metabolic encephalopathy 28/41/3244   CVA (cerebral vascular accident) ([place]) 06/24/2021   Abdominal distension 06/24/2021   Chronic diastolic CHF (congestive heart failure) ([place]) 06/24/2021   Acute-on-chronic kidney injury ([place]) 06/16/2021   Anemia 06/16/2021   Hypoglycemia 06/15/2021   Dementia without behavioral disturbance ([place]) 06/08/2021   Esophagitis 01/06/2020   Dermatitis 08/20/2018   Glaucoma 01/31/2018   CKD (chronic kidney disease), stage III ([place]) 08/03/2017   Hyperlipidemia associated with type 2 diabetes mellitus ([place]) 06/02/2017   BPH (benign prostatic hyperplasia) 06/02/2017   History of prostate cancer 06/02/2017   Insomnia 06/02/2017   Hypertension associated with diabetes ([place]) 06/17/2015   T2DM (type 2 diabetes mellitus) ([place]) 06/17/2015    Orientation RESPIRATION BLADDER Height & Weight     Self, Time, Place  Normal Incontinent Weight:   Height:     BEHAVIORAL SYMPTOMS/MOOD NEUROLOGICAL BOWEL NUTRITION STATUS      Incontinent Diet (carb modified)  AMBULATORY STATUS COMMUNICATION OF NEEDS Skin   Limited Assist Verbally Normal                        Personal Care Assistance Level of Assistance  Bathing, Feeding, Dressing Bathing Assistance: Limited assistance Feeding assistance: Limited assistance Dressing Assistance: Limited assistance     Functional Limitations Info  Speech Sight Info: Adequate Hearing Info: Adequate Speech Info: Impaired (dysarthria)    SPECIAL CARE FACTORS FREQUENCY  PT (By licensed PT), OT (By licensed OT), Speech therapy     PT Frequency: 5x/wk OT Frequency: 5x/wk     Speech Therapy Frequency: 5x/wk      Contractures Contractures Info: Not present    Additional Factors Info  Code Status, Allergies, Insulin Sliding Scale Code Status Info: Full Allergies Info: NKA   Insulin Sliding Scale Info: see DC Summary       Current Medications (06/28/2021):  This is the current hospital active medication list Current Facility-Administered Medications  Medication Dose Route Frequency Provider Last Rate Last Admin    stroke: mapping our early stages of recovery book   Does not apply Once Toy Baker, MD       acetaminophen (TYLENOL) tablet 650 mg  650 mg Oral Q4H PRN Toy Baker, MD       Or   acetaminophen (TYLENOL) 160 MG/5ML solution 650 mg  650 mg Per Tube Q4H PRN Doutova, Anastassia, MD       Or   acetaminophen (TYLENOL) suppository 650 mg  650 mg Rectal Q4H PRN Toy Baker, MD       aspirin EC tablet 325 mg  325 mg Oral Daily Rosalin Hawking, MD   325 mg at 06/27/21 1012   atorvastatin (  LIPITOR) tablet 40 mg  40 mg Oral Daily Rosalin Hawking, MD   40 mg at 06/27/21 1012   brimonidine (ALPHAGAN) 0.2 % ophthalmic solution 1 drop  1 drop Both Eyes BID Toy Baker, MD   1 drop at 06/27/21 2116   clopidogrel (PLAVIX) tablet 75 mg  75 mg Oral Daily Kirby-Graham, Karsten Fells, NP   75 mg at 06/27/21 1012   dorzolamide-timolol (COSOPT) 22.3-6.8 MG/ML ophthalmic solution 1 drop  1 drop Both Eyes BID Toy Baker, MD   1 drop at 06/27/21 2116   hydrOXYzine (ATARAX) tablet 10 mg   10 mg Oral TID PRN Caren Griffins, MD   10 mg at 06/25/21 1340   insulin aspart (novoLOG) injection 0-9 Units  0-9 Units Subcutaneous Q4H Doutova, Nyoka Lint, MD   2 Units at 06/28/21 0837   latanoprost (XALATAN) 0.005 % ophthalmic solution 1 drop  1 drop Both Eyes QHS Doutova, Anastassia, MD   1 drop at 06/27/21 2116   pantoprazole (PROTONIX) EC tablet 40 mg  40 mg Oral Daily Doutova, Anastassia, MD   40 mg at 06/27/21 1012   tamsulosin (FLOMAX) capsule 0.4 mg  0.4 mg Oral Daily Doutova, Anastassia, MD   0.4 mg at 06/27/21 1013   traZODone (DESYREL) tablet 150 mg  150 mg Oral QHS PRN Toy Baker, MD   150 mg at 06/28/21 0256     Discharge Medications: Please see discharge summary for a list of discharge medications.  Relevant Imaging Results:  Relevant Lab Results:   Additional Information SS#: [id number]; [place] Vaccine 07/09/2020, 08/12/2019, 07/22/2019  Geralynn Ochs, LCSW

## 2021-06-28 NOTE — Progress Notes (Signed)
PROGRESS NOTE  David Ford UDJ:[id number] DOB: 1941-05-24 DOA: 06/23/2021 PCP: [name], MD   LOS: 4 days   Brief Narrative / Interim history: 81 year old male with DM2, HTN, dementia, prior history of prostate cancer, recurrent hospitalizations for hypoglycemia most recent fall few weeks ago, comes into the hospital for increased confusion, aphasia, and seizure-like activity.  This is been going on for the past 24 hours and he was brought to the hospital.  Wife reported an episode where he grabbed the side of the table repeatedly few times and was shaking at that time.  Neurology consulted and he was admitted to the hospital.  Primary imaging showed acute CVA.  Subjective / 24h Interval events: No plaints this morning, remains confused.  Assessment & Plan: Principal Problem Acute CVA-MRI on admission showed punctate acute infarct in the left frontal lobe as well as remote lacunar infarct in left basal ganglia moderate chronic microvascular disease along with cerebral atrophy.  Neurology consulted and following.  He underwent CVA work-up, 2D echo shows EF 58-85%, grade 1 diastolic dysfunction.  Carotid Doppler without significant stenosis.  MRI of the brain negative for LVO.  Lipid panel shows an LDL of 154, continue atorvastatin.  A1c 7.2.  PT/OT evaluation recommends SNF.  Social worker consulted.  Currently on aspirin 325 and Plavix, neurology recommends dual antiplatelet therapy for 3 months then aspirin alone  Active Problems Concern for seizures-EEG negative for seizure activity  Acute metabolic encephalopathy, underlying dementia-confused, suspect close to baseline.  Discussed with daughter over the phone and patient has had progressive decline.  I suspect his recurrent hypoglycemic episodes and recurrent hospitalizations for the same last year may have played a role  Essential hypertension-allow permissive hypertension, gradually normalize  Chronic kidney disease, stage  IIIb-creatinine ranging between 1.6-2.2, currently at baseline 1.7  Hyperlipidemia-continue statin  DM2-continue sliding scale  CBG (last 3)  Recent Labs    06/27/21 2358 06/28/21 0431 06/28/21 0803  GLUCAP 218* 158* 190*     Glaucoma -Continue home meds   Chronic diastolic CHF (congestive heart failure) (HCC) -euvolemic   BPH-continue Flomax  Scheduled Meds:   stroke: mapping our early stages of recovery book   Does not apply Once   aspirin EC  325 mg Oral Daily   atorvastatin  40 mg Oral Daily   brimonidine  1 drop Both Eyes BID   clopidogrel  75 mg Oral Daily   dorzolamide-timolol  1 drop Both Eyes BID   insulin aspart  0-9 Units Subcutaneous Q4H   latanoprost  1 drop Both Eyes QHS   pantoprazole  40 mg Oral Daily   tamsulosin  0.4 mg Oral Daily   Continuous Infusions:   PRN Meds:.acetaminophen **OR** acetaminophen (TYLENOL) oral liquid 160 mg/5 mL **OR** acetaminophen, hydrOXYzine, traZODone  Diet Orders (From admission, onward)     Start     Ordered   06/25/21 0825  Diet Carb Modified Fluid consistency: Thin; Room service appropriate? Yes  Diet effective now       Question Answer Comment  Diet-HS Snack? Nothing   Calorie Level Medium 1600-2000   Fluid consistency: Thin   Room service appropriate? Yes      06/25/21 0824            DVT prophylaxis: SCD's Start: 06/24/21 2150     Code Status: Full Code  Family Communication: No family at bedside, daughter over the phone  Status is: Inpatient  Remains inpatient appropriate because: Awaiting SNF placement  Level of  care: Telemetry Medical  Consultants:  Neurology   Procedures:  2D echo  Microbiology  none  Antimicrobials: none    Objective: Vitals:   06/27/21 2043 06/27/21 2358 06/28/21 0431 06/28/21 0800  BP: (!) 178/75 (!) 144/68 (!) 164/73 (!) 152/82  Pulse: 75 65 73 81  Resp: 16 15 14 16   Temp: 99 F (37.2 C) 98.9 F (37.2 C) 98.7 F (37.1 C) 98.8 F (37.1 C)  TempSrc:  Oral Oral Oral Oral  SpO2: 100% 99% 100% 100%    Intake/Output Summary (Last 24 hours) at 06/28/2021 1037 Last data filed at 06/28/2021 0645 Gross per 24 hour  Intake 440 ml  Output 3500 ml  Net -3060 ml    There were no vitals filed for this visit.  Examination:  Constitutional: No distress Eyes: Anicteric ENMT: mmm Neck: normal, supple Respiratory: Clear to auscultation bilaterally, no wheezing, no crackles Cardiovascular: Regular rate and rhythm, no peripheral edema Abdomen: Soft, NT, ND, positive bowel sounds Musculoskeletal: no clubbing / cyanosis.  Skin: No rash seen Neurologic: Nonfocal  Data Reviewed: I have independently reviewed following labs and imaging studies   CBC: Recent Labs  Lab 06/23/21 1243 06/23/21 1308 06/24/21 2325 06/26/21 0217  WBC 5.7  --  6.9 7.8  NEUTROABS 3.3  --  3.6  --   HGB 13.3 14.3 13.0 13.0  HCT 42.2 42.0 39.0 38.9*  MCV 90.6  --  87.2 86.6  PLT 364  --  360 254    Basic Metabolic Panel: Recent Labs  Lab 06/23/21 1243 06/23/21 1308 06/24/21 2325 06/26/21 0217 06/27/21 0139  NA 134* 133* 132* 136 134*  K 4.7 4.7 4.5 4.2 4.0  CL 102 102 102 105 105  CO2 22  --  23 23 22   GLUCOSE 288* 288* 240* 147* 172*  BUN 21 23 23 22 20   CREATININE 1.91* 1.60* 1.75* 1.81* 1.77*  CALCIUM 9.1  --  9.1 9.0 8.7*  MG  --   --  1.9  --   --   PHOS  --   --  3.3  --   --     Liver Function Tests: Recent Labs  Lab 06/23/21 1243 06/24/21 2325  AST 19 17  ALT 16 14  ALKPHOS 74 78  BILITOT 0.7 0.5  PROT 6.7 6.2*  ALBUMIN 3.1* 3.1*    Coagulation Profile: Recent Labs  Lab 06/23/21 1243  INR 0.9    HbA1C: No results for input(s): HGBA1C in the last 72 hours.  CBG: Recent Labs  Lab 06/27/21 1614 06/27/21 2042 06/27/21 2358 06/28/21 0431 06/28/21 0803  GLUCAP 289* 245* 218* 158* 190*     Recent Results (from the past 240 hour(s))  Resp Panel by RT-PCR (Flu A&B, Covid) Nasopharyngeal Swab     Status: None    Collection Time: 06/23/21 12:43 PM   Specimen: Nasopharyngeal Swab; Nasopharyngeal(NP) swabs in vial transport medium  Result Value Ref Range Status   SARS Coronavirus 2 by RT PCR NEGATIVE NEGATIVE Final    Comment: (NOTE) SARS-CoV-2 target nucleic acids are NOT DETECTED.  The SARS-CoV-2 RNA is generally detectable in upper respiratory specimens during the acute phase of infection. The lowest concentration of SARS-CoV-2 viral copies this assay can detect is 138 copies/mL. A negative result does not preclude SARS-Cov-2 infection and should not be used as the sole basis for treatment or other patient management decisions. A negative result may occur with  improper specimen collection/handling, submission of specimen other than nasopharyngeal swab,  presence of viral mutation(s) within the areas targeted by this assay, and inadequate number of viral copies(<138 copies/mL). A negative result must be combined with clinical observations, patient history, and epidemiological information. The expected result is Negative.  Fact Sheet for Patients:  EntrepreneurPulse.com.au  Fact Sheet for Healthcare Providers:  IncredibleEmployment.be  This test is no t yet approved or cleared by the [place] FDA and  has been authorized for detection and/or diagnosis of SARS-CoV-2 by FDA under an Emergency Use Authorization (EUA). This EUA will remain  in effect (meaning this test can be used) for the duration of the COVID-19 declaration under Section 564(b)(1) of the Act, 21 U.S.C.section 360bbb-3(b)(1), unless the authorization is terminated  or revoked sooner.       Influenza A by PCR NEGATIVE NEGATIVE Final   Influenza B by PCR NEGATIVE NEGATIVE Final    Comment: (NOTE) The Xpert Xpress SARS-CoV-2/FLU/RSV plus assay is intended as an aid in the diagnosis of influenza from Nasopharyngeal swab specimens and should not be used as a sole basis for treatment.  Nasal washings and aspirates are unacceptable for Xpert Xpress SARS-CoV-2/FLU/RSV testing.  Fact Sheet for Patients: EntrepreneurPulse.com.au  Fact Sheet for Healthcare Providers: IncredibleEmployment.be  This test is not yet approved or cleared by the [place] FDA and has been authorized for detection and/or diagnosis of SARS-CoV-2 by FDA under an Emergency Use Authorization (EUA). This EUA will remain in effect (meaning this test can be used) for the duration of the COVID-19 declaration under Section 564(b)(1) of the Act, 21 U.S.C. section 360bbb-3(b)(1), unless the authorization is terminated or revoked.  Performed at [facility], [place] [address] [place], [place] [address]       Radiology Studies: No results found.   Marzetta Board, MD, PhD [facility]  Between 7 am - 7 pm I am available, please contact me via Amion (for emergencies) or Securechat (non urgent messages)  Between 7 pm - 7 am I am not available, please contact night coverage MD/APP via Amion

## 2021-06-28 NOTE — TOC Progression Note (Signed)
Transition of Care [facility]) - Progression Note    Patient Details  Name: David Ford MRN: [id number] Date of Birth: [birth year]  Transition of Care [facility]) CM/SW [place], [place] Phone Number: 06/28/2021, 12:23 PM  Clinical Narrative:    CSW spoke with pt's wife who noted she is agreeable to SNF placement with the goal of LTC placement afterward. She is unsure if pt has been to SNF before, but has been to a nursing home in [place]. She knew of [facility], but was given Medicare.gov information to continue research. Different levels of care were discussed and she was encouraged to start the placement process early. Pt has had 2 covid vaccines and a booster. CSW will send referral's and TOC will continue to follow for DC needs.   Expected Discharge Plan: Skilled Nursing Facility Barriers to Discharge: SNF Pending bed offer, Insurance Authorization  Expected Discharge Plan and Services Expected Discharge Plan: [place] Choice: [place], [place] Living arrangements for the past 2 months: Single Family Home                                       Social Determinants of Health (SDOH) Interventions    Readmission Risk Interventions No flowsheet data found.

## 2021-06-28 NOTE — Consult Note (Signed)
° °  [facility] Consult   06/28/2021  Michael Walrath 12-07-1940 [id number]  [facility] [ACO] Patient: [employer] Medicare  Primary Care Provider:  [name], MD, [place] provider [facility] which has a chronic care management team and program   Patient screened for hospitalization with noted for unplanned readmission  of less than 30 days and  to assess for potential [place] Management service needs for post hospital transition.  Review of patient's medical record reveals patient is being recommended for a skilled nursing facility level of care [potentially LTC] per inpatient TOC LCSW notes.  Plan:  Continue to follow progress and disposition to assess for post hospital care management needs.  Will follow til transition of care is final and will sign off if patient transitions to a SNF.  For questions contact:   Natividad Brood, RN BSN [place] [facility] Liaison  [phone] business mobile phone Toll free office [phone]  Fax number: [phone] [email or web address]

## 2021-06-28 NOTE — Care Management Important Message (Signed)
Important Message  Patient Details  Name: David Ford MRN: [id number] Date of Birth: 31-Mar-1941   Medicare Important Message Given:  Yes     Orbie Pyo 06/28/2021, 2:29 PM

## 2021-06-28 NOTE — Telephone Encounter (Signed)
PAM [phone] stated need to call  [phone] Team 4 for VO  LVM to [facility] with VO

## 2021-06-28 NOTE — Progress Notes (Signed)
Physical Therapy Treatment Patient Details Name: David Ford MRN: [id number] DOB: 1940-12-19 Today's Date: 06/28/2021   History of Present Illness Pt is a 81 y.o. male admitted 06/23/21 with confusion, weakness and seizure-like activity. MRI demonstrated a punctate small acute left frontal lobe white matter infarct. PMH significant for diabetes, hypertension, hyperlipidemia, CKD stage III, history of prostate cancer and cognitive impairment    PT Comments    Pt progressing towards physical therapy goals. Pt initially soiled and RN present to assist with linen change, peri-care, and transition from bed to chair. Pt was able to participate in minimal therapeutic exercise, however did well with visual cues and counting out loud for reps. Will continue to follow and progress as able per POC.    Recommendations for follow up therapy are one component of a multi-disciplinary discharge planning process, led by the attending physician.  Recommendations may be updated based on patient status, additional functional criteria and insurance authorization.  Follow Up Recommendations  Skilled nursing-short term rehab (<3 hours/day)     Assistance Recommended at Discharge Frequent or constant Supervision/Assistance  Patient can return home with the following A little help with walking and/or transfers;A little help with bathing/dressing/bathroom;Direct supervision/assist for medications management;Direct supervision/assist for financial management;Assist for transportation;Help with stairs or ramp for entrance   Equipment Recommendations  None recommended by PT    Recommendations for Other Services       Precautions / Restrictions Precautions Precautions: Fall Restrictions Weight Bearing Restrictions: No     Mobility  Bed Mobility Overal bed mobility: Needs Assistance Bed Mobility: Supine to Sit     Supine to sit: Mod assist     General bed mobility comments: Required assist for LE  management and scooting hips to EOB. Increased time required and multimodal cues for sequencing.    Transfers Overall transfer level: Needs assistance Equipment used: Rolling walker (2 wheels) Transfers: Sit to/from Stand;Bed to chair/wheelchair/BSC Sit to Stand: Mod assist;+2 safety/equipment     Step pivot transfers: Mod assist;+2 safety/equipment     General transfer comment: Assist for power up to full stand and to gain/maintain standing balance. VC's for hand placement on seated surface for safety. Increased time to ine up to chair and hand-over-hand assist required to reach back for the arm rests prior to initiating stand>sit.    Ambulation/Gait                   Stairs             Wheelchair Mobility    Modified Rankin (Stroke Patients Only)       Balance Overall balance assessment: Needs assistance Sitting-balance support: No upper extremity supported;Feet supported Sitting balance-Leahy Scale: Fair     Standing balance support: Single extremity supported Standing balance-Leahy Scale: Poor Standing balance comment: Reliant on UE and external support                            Cognition Arousal/Alertness: Awake/alert Behavior During Therapy: Flat affect Overall Cognitive Status: Impaired/Different from baseline Area of Impairment: Attention;Following commands;Safety/judgement;Awareness;Problem solving                   Current Attention Level: Focused   Following Commands: Follows one step commands consistently;Follows one step commands with increased time;Follows multi-step commands inconsistently Safety/Judgement: Decreased awareness of safety;Decreased awareness of deficits Awareness: Intellectual Problem Solving: Slow processing;Decreased initiation;Difficulty sequencing;Requires verbal cues;Requires tactile cues General Comments: Cognitive impairment at baseline. Very  flat throughout        Exercises      General  Comments        Pertinent Vitals/Pain Pain Assessment: No/denies pain    Home Living                          Prior Function            PT Goals (current goals can now be found in the care plan section) Acute Rehab PT Goals Patient Stated Goal: none stated PT Goal Formulation: With patient Time For Goal Achievement: 07/09/21 Potential to Achieve Goals: Good Progress towards PT goals: Progressing toward goals    Frequency    Min 3X/week      PT Plan Frequency needs to be updated    Co-evaluation              AM-PAC PT "6 Clicks" Mobility   Outcome Measure  Help needed turning from your back to your side while in a flat bed without using bedrails?: A Little Help needed moving from lying on your back to sitting on the side of a flat bed without using bedrails?: A Lot Help needed moving to and from a bed to a chair (including a wheelchair)?: A Little Help needed standing up from a chair using your arms (e.g., wheelchair or bedside chair)?: A Little Help needed to walk in hospital room?: A Little Help needed climbing 3-5 steps with a railing? : Total 6 Click Score: 15    End of Session Equipment Utilized During Treatment: Gait belt Activity Tolerance: Patient tolerated treatment well Patient left: in chair;with call bell/phone within reach;with chair alarm set Nurse Communication: Mobility status PT Visit Diagnosis: Unsteadiness on feet (R26.81);Muscle weakness (generalized) (M62.81)     Time: 3235-5732 PT Time Calculation (min) (ACUTE ONLY): 25 min  Charges:  $Gait Training: 8-22 mins $Therapeutic Activity: 8-22 mins                     Rolinda Roan, PT, DPT Acute Rehabilitation Services Pager: [phone] Office: [phone]    Thelma Comp 06/28/2021, 2:04 PM

## 2021-06-29 ENCOUNTER — Encounter (HOSPITAL_COMMUNITY): Payer: Self-pay | Admitting: Internal Medicine

## 2021-06-29 ENCOUNTER — Other Ambulatory Visit: Payer: Self-pay

## 2021-06-29 DIAGNOSIS — Z89412 Acquired absence of left great toe: Secondary | ICD-10-CM | POA: Diagnosis not present

## 2021-06-29 DIAGNOSIS — Z9181 History of falling: Secondary | ICD-10-CM

## 2021-06-29 DIAGNOSIS — E785 Hyperlipidemia, unspecified: Secondary | ICD-10-CM | POA: Diagnosis not present

## 2021-06-29 DIAGNOSIS — Z79899 Other long term (current) drug therapy: Secondary | ICD-10-CM | POA: Diagnosis not present

## 2021-06-29 DIAGNOSIS — Z8546 Personal history of malignant neoplasm of prostate: Secondary | ICD-10-CM | POA: Diagnosis not present

## 2021-06-29 DIAGNOSIS — I129 Hypertensive chronic kidney disease with stage 1 through stage 4 chronic kidney disease, or unspecified chronic kidney disease: Secondary | ICD-10-CM | POA: Diagnosis not present

## 2021-06-29 DIAGNOSIS — D631 Anemia in chronic kidney disease: Secondary | ICD-10-CM | POA: Diagnosis not present

## 2021-06-29 DIAGNOSIS — Z89422 Acquired absence of other left toe(s): Secondary | ICD-10-CM

## 2021-06-29 DIAGNOSIS — Z794 Long term (current) use of insulin: Secondary | ICD-10-CM | POA: Diagnosis not present

## 2021-06-29 DIAGNOSIS — E1122 Type 2 diabetes mellitus with diabetic chronic kidney disease: Secondary | ICD-10-CM | POA: Diagnosis not present

## 2021-06-29 DIAGNOSIS — N183 Chronic kidney disease, stage 3 unspecified: Secondary | ICD-10-CM | POA: Diagnosis not present

## 2021-06-29 LAB — GLUCOSE, CAPILLARY
Glucose-Capillary: 303 mg/dL — ABNORMAL HIGH (ref 70–99)
Glucose-Capillary: 86 mg/dL (ref 70–99)
Glucose-Capillary: 226 mg/dL — ABNORMAL HIGH (ref 70–99)
Glucose-Capillary: 249 mg/dL — ABNORMAL HIGH (ref 70–99)
Glucose-Capillary: 248 mg/dL — ABNORMAL HIGH (ref 70–99)
Glucose-Capillary: 309 mg/dL — ABNORMAL HIGH (ref 70–99)
Glucose-Capillary: 198 mg/dL — ABNORMAL HIGH (ref 70–99)

## 2021-06-29 MED ORDER — ENSURE ENLIVE PO LIQD
237.0000 mL | Freq: Two times a day (BID) | ORAL | Status: DC
  Administered 2021-06-29 – 2021-07-01 (×5): 237 mL via ORAL

## 2021-06-29 NOTE — Progress Notes (Signed)
PROGRESS NOTE  David Ford ATF:[id number] DOB: September 17, 1940 DOA: 06/23/2021 PCP: [name], MD   LOS: 5 days   Brief Narrative / Interim history: 81 year old male with DM2, HTN, dementia, prior history of prostate cancer, recurrent hospitalizations for hypoglycemia most recent fall few weeks ago, comes into the hospital for increased confusion, aphasia, and seizure-like activity.  This is been going on for the past 24 hours and he was brought to the hospital.  Wife reported an episode where he grabbed the side of the table repeatedly few times and was shaking at that time.  Neurology consulted and he was admitted to the hospital.  Imaging showed acute CVA.  Subjective / 24h Interval events: He is without complaints.  No chest pain, no shortness of breath.  Knows he is in the hospital but does not know why.  Remains confused  Assessment & Plan: Principal Problem Acute CVA-MRI on admission showed punctate acute infarct in the left frontal lobe as well as remote lacunar infarct in left basal ganglia moderate chronic microvascular disease along with cerebral atrophy.  Neurology consulted and following.  He underwent CVA work-up, 2D echo shows EF 27-06%, grade 1 diastolic dysfunction.  Carotid Doppler without significant stenosis.  MRI of the brain negative for LVO.  Lipid panel shows an LDL of 154, continue atorvastatin.  A1c 7.2.  PT/OT evaluation recommends SNF.  Social worker consulted.  Currently on aspirin 325 and Plavix, neurology recommends dual antiplatelet therapy for 3 months then aspirin alone.  SNF placement pending  Active Problems Concern for seizures-EEG negative for seizure activity  Acute metabolic encephalopathy, underlying dementia-confused, suspect close to baseline.  Discussed with daughter over the phone and patient has had progressive decline.  I suspect his recurrent hypoglycemic episodes and recurrent hospitalizations for the same last year may have played a  role  Essential hypertension-allow permissive hypertension, gradually normalize  Chronic kidney disease, stage IIIb-creatinine ranging between 1.6-2.2, currently at baseline, repeat tomorrow morning  Hyperlipidemia-continue statin  DM2-continue sliding scale  CBG (last 3)  Recent Labs    06/28/21 2354 06/29/21 0342 06/29/21 0837  GLUCAP 294* 303* 86     Glaucoma -Continue home meds   Chronic diastolic CHF (congestive heart failure) (HCC) -euvolemic   BPH-continue Flomax  Scheduled Meds:   stroke: mapping our early stages of recovery book   Does not apply Once   aspirin EC  325 mg Oral Daily   atorvastatin  40 mg Oral Daily   brimonidine  1 drop Both Eyes BID   clopidogrel  75 mg Oral Daily   dorzolamide-timolol  1 drop Both Eyes BID   feeding supplement  237 mL Oral BID BM   insulin aspart  0-9 Units Subcutaneous Q4H   latanoprost  1 drop Both Eyes QHS   pantoprazole  40 mg Oral Daily   tamsulosin  0.4 mg Oral Daily   Continuous Infusions:   PRN Meds:.acetaminophen **OR** acetaminophen (TYLENOL) oral liquid 160 mg/5 mL **OR** acetaminophen, hydrOXYzine, traZODone  Diet Orders (From admission, onward)     Start     Ordered   06/25/21 0825  Diet Carb Modified Fluid consistency: Thin; Room service appropriate? Yes  Diet effective now       Question Answer Comment  Diet-HS Snack? Nothing   Calorie Level Medium 1600-2000   Fluid consistency: Thin   Room service appropriate? Yes      06/25/21 0824            DVT prophylaxis: SCD's Start:  06/24/21 2150     Code Status: Full Code  Family Communication: No family at bedside, daughter over the phone  Status is: Inpatient  Remains inpatient appropriate because: Awaiting SNF placement  Level of care: Telemetry Medical  Consultants:  Neurology   Procedures:  2D echo  Microbiology  none  Antimicrobials: none    Objective: Vitals:   06/28/21 2002 06/28/21 2356 06/29/21 0400 06/29/21 0801  BP:  (!) 160/72 (!) 158/75 (!) 152/67 (!) 167/71  Pulse: 83 65 68 66  Resp: 18 17 18 19   Temp: 98.5 F (36.9 C) 98 F (36.7 C) 97.8 F (36.6 C) 98.3 F (36.8 C)  TempSrc: Oral Oral Oral   SpO2: 100% 100% 99% 97%    Intake/Output Summary (Last 24 hours) at 06/29/2021 1116 Last data filed at 06/29/2021 0838 Gross per 24 hour  Intake 240 ml  Output 600 ml  Net -360 ml    There were no vitals filed for this visit.  Examination:  Constitutional: NAD Eyes: No scleral icterus ENMT: Moist mucous membranes Neck: normal, supple Respiratory: Clear bilaterally, no wheezing, no crackles Cardiovascular: Regular rate and rhythm, no edema Abdomen: Soft, nontender, nondistended, positive bowel sounds Musculoskeletal: no clubbing / cyanosis.  Skin: No rashes seen Neurologic: No focal deficits  Data Reviewed: I have independently reviewed following labs and imaging studies   CBC: Recent Labs  Lab 06/23/21 1243 06/23/21 1308 06/24/21 2325 06/26/21 0217  WBC 5.7  --  6.9 7.8  NEUTROABS 3.3  --  3.6  --   HGB 13.3 14.3 13.0 13.0  HCT 42.2 42.0 39.0 38.9*  MCV 90.6  --  87.2 86.6  PLT 364  --  360 242    Basic Metabolic Panel: Recent Labs  Lab 06/23/21 1243 06/23/21 1308 06/24/21 2325 06/26/21 0217 06/27/21 0139  NA 134* 133* 132* 136 134*  K 4.7 4.7 4.5 4.2 4.0  CL 102 102 102 105 105  CO2 22  --  23 23 22   GLUCOSE 288* 288* 240* 147* 172*  BUN 21 23 23 22 20   CREATININE 1.91* 1.60* 1.75* 1.81* 1.77*  CALCIUM 9.1  --  9.1 9.0 8.7*  MG  --   --  1.9  --   --   PHOS  --   --  3.3  --   --     Liver Function Tests: Recent Labs  Lab 06/23/21 1243 06/24/21 2325  AST 19 17  ALT 16 14  ALKPHOS 74 78  BILITOT 0.7 0.5  PROT 6.7 6.2*  ALBUMIN 3.1* 3.1*    Coagulation Profile: Recent Labs  Lab 06/23/21 1243  INR 0.9    HbA1C: No results for input(s): HGBA1C in the last 72 hours.  CBG: Recent Labs  Lab 06/28/21 1722 06/28/21 1935 06/28/21 2354 06/29/21 0342  06/29/21 0837  GLUCAP 287* 253* 294* 303* 86     Recent Results (from the past 240 hour(s))  Resp Panel by RT-PCR (Flu A&B, Covid) Nasopharyngeal Swab     Status: None   Collection Time: 06/23/21 12:43 PM   Specimen: Nasopharyngeal Swab; Nasopharyngeal(NP) swabs in vial transport medium  Result Value Ref Range Status   SARS Coronavirus 2 by RT PCR NEGATIVE NEGATIVE Final    Comment: (NOTE) SARS-CoV-2 target nucleic acids are NOT DETECTED.  The SARS-CoV-2 RNA is generally detectable in upper respiratory specimens during the acute phase of infection. The lowest concentration of SARS-CoV-2 viral copies this assay can detect is 138 copies/mL. A negative result  does not preclude SARS-Cov-2 infection and should not be used as the sole basis for treatment or other patient management decisions. A negative result may occur with  improper specimen collection/handling, submission of specimen other than nasopharyngeal swab, presence of viral mutation(s) within the areas targeted by this assay, and inadequate number of viral copies(<138 copies/mL). A negative result must be combined with clinical observations, patient history, and epidemiological information. The expected result is Negative.  Fact Sheet for Patients:  EntrepreneurPulse.com.au  Fact Sheet for Healthcare Providers:  IncredibleEmployment.be  This test is no t yet approved or cleared by the [place] FDA and  has been authorized for detection and/or diagnosis of SARS-CoV-2 by FDA under an Emergency Use Authorization (EUA). This EUA will remain  in effect (meaning this test can be used) for the duration of the COVID-19 declaration under Section 564(b)(1) of the Act, 21 U.S.C.section 360bbb-3(b)(1), unless the authorization is terminated  or revoked sooner.       Influenza A by PCR NEGATIVE NEGATIVE Final   Influenza B by PCR NEGATIVE NEGATIVE Final    Comment: (NOTE) The Xpert  Xpress SARS-CoV-2/FLU/RSV plus assay is intended as an aid in the diagnosis of influenza from Nasopharyngeal swab specimens and should not be used as a sole basis for treatment. Nasal washings and aspirates are unacceptable for Xpert Xpress SARS-CoV-2/FLU/RSV testing.  Fact Sheet for Patients: EntrepreneurPulse.com.au  Fact Sheet for Healthcare Providers: IncredibleEmployment.be  This test is not yet approved or cleared by the [place] FDA and has been authorized for detection and/or diagnosis of SARS-CoV-2 by FDA under an Emergency Use Authorization (EUA). This EUA will remain in effect (meaning this test can be used) for the duration of the COVID-19 declaration under Section 564(b)(1) of the Act, 21 U.S.C. section 360bbb-3(b)(1), unless the authorization is terminated or revoked.  Performed at [facility], [place] [address] [place], [place] [address]       Radiology Studies: No results found.   Marzetta Board, MD, PhD [facility]  Between 7 am - 7 pm I am available, please contact me via Amion (for emergencies) or Securechat (non urgent messages)  Between 7 pm - 7 am I am not available, please contact night coverage MD/APP via Amion

## 2021-06-29 NOTE — Plan of Care (Signed)
°  Problem: Education: Goal: Knowledge of disease or condition will improve Outcome: Not Progressing Goal: Knowledge of secondary prevention will improve (SELECT ALL) Outcome: Not Progressing Goal: Knowledge of patient specific risk factors will improve (INDIVIDUALIZE FOR PATIENT) Outcome: Not Progressing   Patient not currently at a point where he receives more than basic information but care and work process explained to patient

## 2021-06-29 NOTE — Progress Notes (Signed)
Initial Nutrition Assessment  DOCUMENTATION CODES:   Not applicable  INTERVENTION:  -obtain new weight -continue Ensure Enlive po BID, each supplement provides 350 kcal and 20 grams of protein -MVI with minerals daily -recommend c/s to diabetes coordinator   NUTRITION DIAGNOSIS:   Inadequate oral intake related to decreased appetite as evidenced by meal completion < 50%.  GOAL:   Patient will meet greater than or equal to 90% of their needs  MONITOR:   PO intake, Supplement acceptance, Labs, I & O's  REASON FOR ASSESSMENT:   Malnutrition Screening Tool    ASSESSMENT:   Pt with PMH significant for type 2 DM, HTN, dementia, h/o prostate Ca, recurrent hospitalizations for hypoglycemia admitted with acute CVA.  Pt unavailable at time of RD visit, working with therapies. Unable to obtain diet/weight history from pt. Weight has not been updated for this admission. Need new weight obtained so weight history can be properly assessed.   PO Intake: 50% x 2 recorded meals   UOP: 965ml x24 hours I/O: -4068ml since admit  Medications: Scheduled Meds:   stroke: mapping our early stages of recovery book   Does not apply Once   amLODipine  5 mg Oral Daily   aspirin EC  325 mg Oral Daily   atorvastatin  40 mg Oral Daily   brimonidine  1 drop Both Eyes BID   clopidogrel  75 mg Oral Daily   dorzolamide-timolol  1 drop Both Eyes BID   feeding supplement  237 mL Oral BID BM   insulin aspart  0-9 Units Subcutaneous Q4H   latanoprost  1 drop Both Eyes QHS   pantoprazole  40 mg Oral Daily   tamsulosin  0.4 mg Oral Daily   Labs: Recent Labs  Lab 06/24/21 2325 06/26/21 0217 06/27/21 0139 06/30/21 0435  NA 132* 136 134* 137  K 4.5 4.2 4.0 4.1  CL 102 105 105 106  CO2 23 23 22  21*  BUN 23 22 20  37*  CREATININE 1.75* 1.81* 1.77* 1.97*  CALCIUM 9.1 9.0 8.7* 8.8*  MG 1.9  --   --   --   PHOS 3.3  --   --   --   GLUCOSE 240* 147* 172* 165*  CBGs: 151-303 x24 hours     NUTRITION - FOCUSED PHYSICAL EXAM: Unable to complete at this time; will attempt at follow-up.   Diet Order:   Diet Order             Diet Carb Modified Fluid consistency: Thin; Room service appropriate? Yes  Diet effective now                   EDUCATION NEEDS:   No education needs have been identified at this time  Skin:  Skin Assessment: Reviewed RN Assessment  Last BM:  1/9  Height:   Ht Readings from Last 1 Encounters:  06/15/21 6\' 1"  (1.854 m)    Weight:   Wt Readings from Last 1 Encounters:  06/15/21 85.7 kg   BMI:  There is no height or weight on file to calculate BMI.  Estimated Nutritional Needs:   Kcal:  2100-2300  Protein:  105-115 grams  Fluid:  >2L/d     Theone Stanley., MS, RD, LDN (she/her/hers) RD pager number and weekend/on-call pager number located in [place].

## 2021-06-29 NOTE — TOC Progression Note (Signed)
Transition of Care [facility]) - Progression Note    Patient Details  Name: David Ford MRN: [id number] Date of Birth: [birth year]  Transition of Care [facility]) CM/SW [place], [place] Phone Number: 06/29/2021, 4:10 PM  Clinical Narrative:   CSW spoke with wife earlier today to discuss POA vs guardianship. CSW discussed with MD, patient is not able to complete POA at this time due to confusion. CSW coordinated with MD to complete letter for the patient's wife to pursue guardianship. CSW met with patient's wife later in the day to provide bed offers for SNF and provide her with the letter. CSW discussed with wife legal concerns, and she has an appointment with an attorney scheduled for tomorrow morning. CSW to follow.    Expected Discharge Plan: Skilled Nursing Facility Barriers to Discharge: SNF Pending bed offer, Insurance Authorization  Expected Discharge Plan and Services Expected Discharge Plan: [place] Choice: [place], [place] Living arrangements for the past 2 months: Single Family Home                                       Social Determinants of Health (SDOH) Interventions    Readmission Risk Interventions No flowsheet data found.

## 2021-06-30 LAB — CBC
HCT: 38.2 % — ABNORMAL LOW (ref 39.0–52.0)
Hemoglobin: 12.8 g/dL — ABNORMAL LOW (ref 13.0–17.0)
MCH: 29 pg (ref 26.0–34.0)
MCHC: 33.5 g/dL (ref 30.0–36.0)
MCV: 86.6 fL (ref 80.0–100.0)
Platelets: 375 10*3/uL (ref 150–400)
RBC: 4.41 MIL/uL (ref 4.22–5.81)
RDW: 12.9 % (ref 11.5–15.5)
WBC: 7.1 10*3/uL (ref 4.0–10.5)
nRBC: 0 % (ref 0.0–0.2)

## 2021-06-30 LAB — BASIC METABOLIC PANEL
Anion gap: 10 (ref 5–15)
BUN: 37 mg/dL — ABNORMAL HIGH (ref 8–23)
CO2: 21 mmol/L — ABNORMAL LOW (ref 22–32)
Calcium: 8.8 mg/dL — ABNORMAL LOW (ref 8.9–10.3)
Chloride: 106 mmol/L (ref 98–111)
Creatinine, Ser: 1.97 mg/dL — ABNORMAL HIGH (ref 0.61–1.24)
GFR, Estimated: 34 mL/min — ABNORMAL LOW (ref >60)
Glucose, Bld: 165 mg/dL — ABNORMAL HIGH (ref 70–99)
Potassium: 4.1 mmol/L (ref 3.5–5.1)
Sodium: 137 mmol/L (ref 135–145)

## 2021-06-30 LAB — RESP PANEL BY RT-PCR (FLU A&B, COVID) ARPGX2
Influenza A by PCR: NEGATIVE
Influenza B by PCR: NEGATIVE
SARS Coronavirus 2 by RT PCR: NEGATIVE

## 2021-06-30 LAB — GLUCOSE, CAPILLARY
Glucose-Capillary: 151 mg/dL — ABNORMAL HIGH (ref 70–99)
Glucose-Capillary: 162 mg/dL — ABNORMAL HIGH (ref 70–99)
Glucose-Capillary: 220 mg/dL — ABNORMAL HIGH (ref 70–99)
Glucose-Capillary: 271 mg/dL — ABNORMAL HIGH (ref 70–99)
Glucose-Capillary: 308 mg/dL — ABNORMAL HIGH (ref 70–99)
Glucose-Capillary: 431 mg/dL — ABNORMAL HIGH (ref 70–99)

## 2021-06-30 MED ORDER — INSULIN ASPART 100 UNIT/ML IJ SOLN
11.0000 [IU] | Freq: Once | INTRAMUSCULAR | Status: AC
  Administered 2021-06-30: 11 [IU] via SUBCUTANEOUS

## 2021-06-30 MED ORDER — AMLODIPINE BESYLATE 5 MG PO TABS
5.0000 mg | ORAL_TABLET | Freq: Every day | ORAL | Status: DC
  Administered 2021-06-30 – 2021-07-01 (×2): 5 mg via ORAL
  Filled 2021-06-30 (×2): qty 1

## 2021-06-30 MED ORDER — ADULT MULTIVITAMIN W/MINERALS CH
1.0000 | ORAL_TABLET | Freq: Every day | ORAL | Status: DC
  Administered 2021-06-30 – 2021-07-01 (×2): 1 via ORAL
  Filled 2021-06-30 (×2): qty 1

## 2021-06-30 NOTE — Progress Notes (Signed)
Patient pleasantly confused and was holding eyes tight not allowing nurse to insert eye drops. Patient given directions and full explanation of what nurse was doing. Patient finally allowed nurse to insert eye drops and cleanse face before bed.

## 2021-06-30 NOTE — Progress Notes (Signed)
PROGRESS NOTE  David Ford  DOB: May 31, 1941  PCP: [name], MD HCW:[id number]  DOA: 06/23/2021  LOS: 6 days  Hospital Day: 8  Chief Complaint  Patient presents with   Weakness    Brief narrative: David Ford is a 81 y.o. male with PMH significant for DM2, HTN, dementia, prior history of prostate cancer, recurrent hospitalizations for hypoglycemia, most recent fall few weeks ago. Patient was brought to the ED on 06/23/2021 for increased confusion, aphasia and seizure-like activity, ongoing for 24 hours.  He is wife also reported an episode where he grabbed the side of the table repeatedly few times and was shaking at that time.  MRI showed punctate acute infarct in the left frontal lobe and remote lacunar infarct in the left basal ganglia and moderate chronic microvascular disease. Admitted to hospitalist service Neurology consulted  Subjective: Patient was seen and examined this morning.  Pleasant elderly African-American male.  Alert, awake, not oriented to time, place and person.  Not able to answer any other questions for me.  Assessment/Plan: Acute left frontal lobe stroke -Presented with increased confusion, aphasia, seizure-like activity.   -MRI on admission showed punctate acute infarct in the left frontal lobe as well as remote lacunar infarct in left basal ganglia moderate chronic microvascular disease along with cerebral atrophy.  -Neurology consult appreciated. He underwent CVA work-up.  MRA head negative for large vessel occlusion.  2D echo shows EF 17-61%, grade 1 diastolic dysfunction. Carotid Doppler without significant stenosis. Lipid panel showed an LDL of 154, A1c 7.2 -EEG was negative for seizure activity. -PT eval was obtained.  SNF recommended -Per neurology recommendation, patient is currently on aspirin 325 mg daily and Plavix 75 mg daily for 3 months followed by aspirin alone. -Continue Lipitor. -Currently SNF pending.  Acute metabolic  encephalopathy -Multifactorial: Stroke, underlying dementia, prolonged hospitalization, probably recurrent hypoglycemic episodes -Continues to remain confused and disoriented. Per family, patient had a progressive decline in his mental status even prior to admission.  Chronic gastric CHF  Essential hypertension -Was on Coreg and amlodipine at home.  Currently not on any antihypertensive. Blood pressure trending mostly above 150s.  Currently euvolemic.  I will initiate amlodipine 5 mg daily today.  Coreg remains on hold.   CKD 3B  -creatinine at baseline less than 1.7.  Currently at baseline.  Continue to monitor  Recent Labs    06/15/21 1719 06/16/21 0339 06/17/21 0746 06/18/21 0343 06/23/21 1243 06/23/21 1308 06/24/21 2325 06/26/21 0217 06/27/21 0139 06/30/21 0435  BUN 33* 33* 32* 33* 21 23 23 22 20  37*  CREATININE 2.26* 2.13* 1.80* 1.70* 1.91* 1.60* 1.75* 1.81* 1.77* 1.97*   Hyperlipidemia -continue statin   Type 2 diabetes mellitus -A1c 7.2 on 06/24/2021 -Home meds include -Currently on sliding scale insulin only.  Blood sugar level elevated to over 300 this morning.  I will resume him on Lantus 5 units daily.  Continue Accu-Cheks Recent Labs  Lab 06/29/21 2036 06/29/21 2322 06/30/21 0322 06/30/21 0735 06/30/21 1115  GLUCAP 309* 198* 162* 151* 308*   Glaucoma -Continue home meds   Chronic diastolic CHF (congestive heart failure) (HCC) -euvolemic    BPH -continue Flomax  Mobility: Encourage ambulation Living condition: Was living at home Goals of care:   Code Status: Full Code  Nutritional status: There is no height or weight on file to calculate BMI.      Diet:  Diet Order             Diet Carb  Modified Fluid consistency: Thin; Room service appropriate? Yes  Diet effective now                  DVT prophylaxis:  SCD's Start: 06/24/21 2150   Antimicrobials: None Fluid: None Consultants: Neurology Family Communication: None at bedside  Status  is: Inpatient  Continue in-hospital care because: Pending placement Level of care: Telemetry Medical   Dispo: The patient is from: Home              Anticipated d/c is to: SNF              Patient currently is medically stable to d/c.   Difficult to place patient No     Infusions:    Scheduled Meds:   stroke: mapping our early stages of recovery book   Does not apply Once   amLODipine  5 mg Oral Daily   aspirin EC  325 mg Oral Daily   atorvastatin  40 mg Oral Daily   brimonidine  1 drop Both Eyes BID   clopidogrel  75 mg Oral Daily   dorzolamide-timolol  1 drop Both Eyes BID   feeding supplement  237 mL Oral BID BM   insulin aspart  0-9 Units Subcutaneous Q4H   latanoprost  1 drop Both Eyes QHS   pantoprazole  40 mg Oral Daily   tamsulosin  0.4 mg Oral Daily    PRN meds: acetaminophen **OR** acetaminophen (TYLENOL) oral liquid 160 mg/5 mL **OR** acetaminophen, hydrOXYzine, traZODone   Antimicrobials: Anti-infectives (From admission, onward)    None       Objective: Vitals:   06/30/21 1120 06/30/21 1211  BP: (!) 194/86 (!) 149/81  Pulse: 89   Resp: 20   Temp: 98.1 F (36.7 C)   SpO2: 99%     Intake/Output Summary (Last 24 hours) at 06/30/2021 1455 Last data filed at 06/30/2021 0815 Gross per 24 hour  Intake --  Output 950 ml  Net -950 ml   There were no vitals filed for this visit. Weight change:  There is no height or weight on file to calculate BMI.   Physical Exam: General exam: Pleasant, elderly African-American male.  Not in distress Skin: No rashes, lesions or ulcers. HEENT: Atraumatic, normocephalic, no obvious bleeding Lungs: Clear to auscultation bilaterally CVS: Regular rate and rhythm, no murmur GI/Abd soft, nontender, nondistended, bowel sound present CNS: Alert, awake, not oriented to place, person and time Psychiatry: Sad affect Extremities: No pedal edema, no calf tenderness  Data Review: I have personally reviewed the laboratory  data and studies available.  F/u labs ordered Unresulted Labs (From admission, onward)     Start     Ordered   Unscheduled  CBC with Differential/Platelet  Daily,   R     Question:  Specimen collection method  Answer:  Lab=Lab collect   06/30/21 1455   Unscheduled  Basic metabolic panel  Daily,   R     Question:  Specimen collection method  Answer:  Lab=Lab collect   06/30/21 1455            Signed, Terrilee Croak, MD [facility] 06/30/2021

## 2021-06-30 NOTE — Progress Notes (Signed)
Occupational Therapy Treatment Patient Details Name: David Ford MRN: [id number] DOB: 05-20-1941 Today's Date: 06/30/2021   History of present illness Pt is a 81 y.o. male admitted 06/23/21 with confusion, weakness and seizure-like activity. MRI demonstrated a punctate small acute left frontal lobe white matter infarct. PMH significant for diabetes, hypertension, hyperlipidemia, CKD stage III, history of prostate cancer and cognitive impairment   OT comments  Patient received in bed and agreeable to OT treatment. Patient seen for grooming seated on EOB and standing from EOB to address following directions and increasing independence with self care tasks. Patient was able to stand with mod assist from EOB to RW but was unable to side step towards HOB.  Acute OT to continue to follow.    Recommendations for follow up therapy are one component of a multi-disciplinary discharge planning process, led by the attending physician.  Recommendations may be updated based on patient status, additional functional criteria and insurance authorization.    Follow Up Recommendations  Skilled nursing-short term rehab (<3 hours/day)    Assistance Recommended at Discharge Frequent or constant Supervision/Assistance  Patient can return home with the following  A little help with walking and/or transfers;A little help with bathing/dressing/bathroom;Assistance with cooking/housework;Assistance with feeding;Direct supervision/assist for medications management;Direct supervision/assist for financial management;Assist for transportation;Help with stairs or ramp for entrance   Equipment Recommendations  Other (comment) (patient has appropriate DME)    Recommendations for Other Services      Precautions / Restrictions Precautions Precautions: Fall Restrictions Weight Bearing Restrictions: No       Mobility Bed Mobility Overal bed mobility: Needs Assistance Bed Mobility: Supine to Sit;Sit to Supine      Supine to sit: Mod assist Sit to supine: Mod assist   General bed mobility comments: patient had difficulty initiating bed mobility from supine to sit and sit to supine    Transfers Overall transfer level: Needs assistance Equipment used: Rolling walker (2 wheels) Transfers: Sit to/from Stand Sit to Stand: Mod assist           General transfer comment: Performed 2 stands from EOB to side step towards head of bed with mod assist to stand and min assist for balance.  Patient was unable to side step on command     Balance Overall balance assessment: Needs assistance Sitting-balance support: No upper extremity supported;Feet supported Sitting balance-Leahy Scale: Fair Sitting balance - Comments: able to perform grooming seated on EOB   Standing balance support: Bilateral upper extremity supported Standing balance-Leahy Scale: Poor Standing balance comment: Reliant on UE and external support                           ADL either performed or assessed with clinical judgement   ADL Overall ADL's : Needs assistance/impaired     Grooming: Minimal assistance;Sitting;Wash/dry hands;Wash/dry face;Oral care;Cueing for sequencing Grooming Details (indicate cue type and reason): performed seated on EOB                               General ADL Comments: performed grooming seated on EOB with verbal cues for sequencing.    Extremity/Trunk Assessment              Vision       Perception     Praxis      Cognition Arousal/Alertness: Awake/alert Behavior During Therapy: Flat affect Overall Cognitive Status: Impaired/Different from baseline Area of Impairment:  Attention;Following commands;Safety/judgement;Awareness;Problem solving                   Current Attention Level: Focused   Following Commands: Follows one step commands consistently;Follows one step commands with increased time;Follows multi-step commands  inconsistently Safety/Judgement: Decreased awareness of safety;Decreased awareness of deficits Awareness: Intellectual Problem Solving: Slow processing;Decreased initiation;Difficulty sequencing;Requires verbal cues;Requires tactile cues General Comments: oriented to self. extra time to follow commands          Exercises     Shoulder Instructions       General Comments      Pertinent Vitals/ Pain       Pain Assessment: No/denies pain  Home Living                                          Prior Functioning/Environment              Frequency  Min 2X/week        Progress Toward Goals  OT Goals(current goals can now be found in the care plan section)  Progress towards OT goals: Progressing toward goals  Acute Rehab OT Goals Patient Stated Goal: none stated OT Goal Formulation: With patient Time For Goal Achievement: 07/09/21 Potential to Achieve Goals: Good ADL Goals Pt Will Perform Grooming: with supervision;standing Pt Will Perform Upper Body Dressing: with modified independence;sitting Pt Will Perform Lower Body Dressing: with supervision;sit to/from stand Pt Will Transfer to Toilet: with supervision;ambulating Pt Will Perform Toileting - Clothing Manipulation and hygiene: with supervision;sit to/from stand Additional ADL Goal #1: Pt will demonstrate improved mentation by scoring <4/10 on short blessed test and answering 4/4 safety questions from the [facility] correctly: 1. What do you do for yourself if you are sick with a cold. 2. What do you do if you burn yourself and the wound becomes infected. 3. What do you do if you experience severe chest pain and shortness of breath? 4. What number do you call in an emergency?  Plan Discharge plan remains appropriate    Co-evaluation                 AM-PAC OT "6 Clicks" Daily Activity     Outcome Measure   Help from another person eating meals?: None Help from another person taking care of  personal grooming?: A Little Help from another person toileting, which includes using toliet, bedpan, or urinal?: A Little Help from another person bathing (including washing, rinsing, drying)?: A Lot Help from another person to put on and taking off regular upper body clothing?: A Little Help from another person to put on and taking off regular lower body clothing?: A Lot 6 Click Score: 17    End of Session Equipment Utilized During Treatment: Gait belt;Rolling walker (2 wheels)  OT Visit Diagnosis: Unsteadiness on feet (R26.81);Other symptoms and signs involving cognitive function   Activity Tolerance Patient tolerated treatment well   Patient Left in bed;with call bell/phone within reach;with bed alarm set   Nurse Communication Mobility status        Time: 7412-8786 OT Time Calculation (min): 24 min  Charges: OT General Charges $OT Visit: 1 Visit OT Treatments $Self Care/Home Management : 8-22 mins $Therapeutic Activity: 8-22 mins  Lodema Hong, [place]  Pager [phone] Office [place] 06/30/2021, 1:06 PM

## 2021-06-30 NOTE — Progress Notes (Signed)
Speech Language Pathology Treatment: Cognitive-Linquistic  Patient Details Name: David Ford MRN: [id number] DOB: [birth year] Today's Date: 06/30/2021 Time: 9371-6967 SLP Time Calculation (min) (ACUTE ONLY): 16 min  Assessment / Plan / Recommendation Clinical Impression  Pt seen post SLE for treatment of cognitive functions related to orientation and functional recall. Pt continues to be disoriented to place and situation, even when given choice cues to improve recall. Environmental cues were also not consistently effective in helping pt identify current place. Recall memory remains poor and he demonstrated difficulty recalling that he was at the hospital immediately after clinician provided this information. When presented with call bell, instructed pt to push the red button to call for help. Using spaced retrieval techniques, pt recalled how to call for help via verbalizing and/or pointing to red button after 30 second delay x2. He was unable to recall this information after 1 minute. Will continue f/u for treatment of cognitive functions and determine PLOF.   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  Pt PMH + dementia, prostate cancer.  He denies cognitive deficits -      SLP Plan  Continue with current plan of care      Recommendations for follow up therapy are one component of a multi-disciplinary discharge planning process, led by the attending physician.  Recommendations may be updated based on patient status, additional functional criteria and insurance authorization.    Recommendations                   Follow Up Recommendations: Skilled nursing-short term rehab (<3 hours/day) Assistance recommended at discharge: Frequent or constant Supervision/Assistance SLP Visit Diagnosis: Cognitive communication deficit (E93.810) Plan: Continue with current plan of care          Ellwood Dense, [place],  [place] Office Number: [place]  06/30/2021, 1:44 PM

## 2021-06-30 NOTE — Progress Notes (Signed)
Notified MD of patient's 2000 CBG of 431. Awaiting response/orders at this time.

## 2021-06-30 NOTE — TOC Progression Note (Signed)
Transition of Care [facility]) - Progression Note    Patient Details  Name: David Ford MRN: [id number] Date of Birth: June 20, 1941  Transition of Care [facility]) CM/SW [place], [place] Phone Number: 06/30/2021, 4:22 PM  Clinical Narrative:   CSW spoke with patient's wife this morning, she has chose [facility] for SNF. David Ford has a bed, started insurance authorization and has received approval to admit patient to SNF today. Per MD, will wait and discharge tomorrow. CSW to follow.    Expected Discharge Plan: Skilled Nursing Facility Barriers to Discharge: SNF Pending discharge summary, SNF Pending discharge orders  Expected Discharge Plan and Services Expected Discharge Plan: [place] Choice: David Ford, David Ford Living arrangements for the past 2 months: Single Family Home                                       Social Determinants of Health (SDOH) Interventions    Readmission Risk Interventions No flowsheet data found.

## 2021-06-30 NOTE — Progress Notes (Signed)
Physical Therapy Treatment Patient Details Name: David Ford MRN: [id number] DOB: [birth year] Today's Date: 06/30/2021   History of Present Illness Pt is a 81 y.o. male admitted 06/23/21 with confusion, weakness and seizure-like activity. MRI demonstrated a punctate small acute left frontal lobe white matter infarct. PMH significant for diabetes, hypertension, hyperlipidemia, CKD stage III, history of prostate cancer and cognitive impairment    PT Comments    Pt received in supine, agreeable to therapy session with encouragement and following 1-step multimodal cues with increased time. Pt needing up to modA for transfers and bed mobility, denies fatigue but only agreeable to step to chair, not for ambulation in room. Pt continues to benefit from PT services to progress toward functional mobility goals. Continue to recommend SNF.  Recommendations for follow up therapy are one component of a multi-disciplinary discharge planning process, led by the attending physician.  Recommendations may be updated based on patient status, additional functional criteria and insurance authorization.  Follow Up Recommendations  Skilled nursing-short term rehab (<3 hours/day)     Assistance Recommended at Discharge Frequent or constant Supervision/Assistance  Patient can return home with the following A little help with walking and/or transfers;A little help with bathing/dressing/bathroom;Direct supervision/assist for medications management;Direct supervision/assist for financial management;Assist for transportation;Help with stairs or ramp for entrance   Equipment Recommendations  None recommended by PT    Recommendations for Other Services       Precautions / Restrictions Precautions Precautions: Fall Restrictions Weight Bearing Restrictions: No     Mobility  Bed Mobility Overal bed mobility: Needs Assistance Bed Mobility: Supine to Sit;Sit to Supine     Supine to sit: Mod assist     General  bed mobility comments: patient had difficulty initiating bed mobility from supine to sit but once his hand placed on bed rail, he was able to assist somewhat with hip advancement/trunk raising    Transfers Overall transfer level: Needs assistance Equipment used: Rolling walker (2 wheels) Transfers: Sit to/from Stand Sit to Stand: Mod assist;From elevated surface     Step pivot transfers: Mod assist     General transfer comment: from elevated bed>RW with max multimodal cues and increased time to rise. Max verbal/tactile cues for pivotal steps to chair        Balance Overall balance assessment: Needs assistance Sitting-balance support: No upper extremity supported;Feet supported Sitting balance-Leahy Scale: Fair Sitting balance - Comments: able to perform grooming seated on EOB   Standing balance support: Bilateral upper extremity supported Standing balance-Leahy Scale: Poor Standing balance comment: Reliant on UE and external support      Cognition Arousal/Alertness: Awake/alert Behavior During Therapy: Flat affect Overall Cognitive Status: Impaired/Different from baseline Area of Impairment: Attention;Following commands;Safety/judgement;Awareness;Problem solving                   Current Attention Level: Focused   Following Commands: Follows one step commands consistently;Follows one step commands with increased time;Follows multi-step commands inconsistently Safety/Judgement: Decreased awareness of safety;Decreased awareness of deficits Awareness: Intellectual Problem Solving: Slow processing;Decreased initiation;Difficulty sequencing;Requires verbal cues;Requires tactile cues General Comments: oriented to self. extra time to follow commands        Exercises General Exercises - Lower Extremity Ankle Circles/Pumps: AROM;Both;10 reps;Supine Long Arc Quad: AROM;Both;10 reps;Seated Hip Flexion/Marching: AROM;Both;10 reps;Seated    General Comments         Pertinent Vitals/Pain Pain Assessment: No/denies pain Faces Pain Scale: No hurt     PT Goals (current goals can now be found in the  care plan section) Acute Rehab PT Goals Patient Stated Goal: none stated PT Goal Formulation: With patient Time For Goal Achievement: 07/09/21 Progress towards PT goals: Progressing toward goals    Frequency    Min 3X/week      PT Plan Frequency needs to be updated       AM-PAC PT "6 Clicks" Mobility   Outcome Measure  Help needed turning from your back to your side while in a flat bed without using bedrails?: A Little Help needed moving from lying on your back to sitting on the side of a flat bed without using bedrails?: A Lot Help needed moving to and from a bed to a chair (including a wheelchair)?: A Lot Help needed standing up from a chair using your arms (e.g., wheelchair or bedside chair)?: A Lot Help needed to walk in hospital room?: A Lot Help needed climbing 3-5 steps with a railing? : Total 6 Click Score: 12    End of Session Equipment Utilized During Treatment: Gait belt Activity Tolerance: Patient tolerated treatment well Patient left: in chair;with call bell/phone within reach;with chair alarm set Nurse Communication: Mobility status PT Visit Diagnosis: Unsteadiness on feet (R26.81);Muscle weakness (generalized) (M62.81)     Time: 1594-5859 PT Time Calculation (min) (ACUTE ONLY): 26 min  Charges:  $Therapeutic Exercise: 8-22 mins $Therapeutic Activity: 8-22 mins                     David Ford P., PTA Acute Rehabilitation Services Pager: [phone] Office: [place] 06/30/2021, 4:51 PM

## 2021-07-01 DIAGNOSIS — R5381 Other malaise: Secondary | ICD-10-CM | POA: Diagnosis not present

## 2021-07-01 DIAGNOSIS — U071 COVID-19: Secondary | ICD-10-CM | POA: Diagnosis not present

## 2021-07-01 DIAGNOSIS — N1832 Chronic kidney disease, stage 3b: Secondary | ICD-10-CM | POA: Diagnosis not present

## 2021-07-01 DIAGNOSIS — I63522 Cerebral infarction due to unspecified occlusion or stenosis of left anterior cerebral artery: Secondary | ICD-10-CM | POA: Diagnosis not present

## 2021-07-01 DIAGNOSIS — G9341 Metabolic encephalopathy: Secondary | ICD-10-CM | POA: Diagnosis not present

## 2021-07-01 DIAGNOSIS — I152 Hypertension secondary to endocrine disorders: Secondary | ICD-10-CM | POA: Diagnosis not present

## 2021-07-01 DIAGNOSIS — I509 Heart failure, unspecified: Secondary | ICD-10-CM | POA: Diagnosis not present

## 2021-07-01 DIAGNOSIS — R0989 Other specified symptoms and signs involving the circulatory and respiratory systems: Secondary | ICD-10-CM | POA: Diagnosis not present

## 2021-07-01 DIAGNOSIS — R2689 Other abnormalities of gait and mobility: Secondary | ICD-10-CM | POA: Diagnosis not present

## 2021-07-01 DIAGNOSIS — R4701 Aphasia: Secondary | ICD-10-CM | POA: Diagnosis not present

## 2021-07-01 DIAGNOSIS — R41841 Cognitive communication deficit: Secondary | ICD-10-CM | POA: Diagnosis not present

## 2021-07-01 DIAGNOSIS — W19XXXA Unspecified fall, initial encounter: Secondary | ICD-10-CM | POA: Diagnosis not present

## 2021-07-01 DIAGNOSIS — R55 Syncope and collapse: Secondary | ICD-10-CM | POA: Diagnosis not present

## 2021-07-01 DIAGNOSIS — I69398 Other sequelae of cerebral infarction: Secondary | ICD-10-CM | POA: Diagnosis not present

## 2021-07-01 DIAGNOSIS — R2681 Unsteadiness on feet: Secondary | ICD-10-CM | POA: Diagnosis not present

## 2021-07-01 DIAGNOSIS — I6381 Other cerebral infarction due to occlusion or stenosis of small artery: Secondary | ICD-10-CM | POA: Diagnosis not present

## 2021-07-01 DIAGNOSIS — F0150 Vascular dementia without behavioral disturbance: Secondary | ICD-10-CM | POA: Diagnosis not present

## 2021-07-01 DIAGNOSIS — E1140 Type 2 diabetes mellitus with diabetic neuropathy, unspecified: Secondary | ICD-10-CM | POA: Diagnosis not present

## 2021-07-01 DIAGNOSIS — Z89422 Acquired absence of other left toe(s): Secondary | ICD-10-CM | POA: Diagnosis not present

## 2021-07-01 DIAGNOSIS — E1159 Type 2 diabetes mellitus with other circulatory complications: Secondary | ICD-10-CM | POA: Diagnosis not present

## 2021-07-01 DIAGNOSIS — M6281 Muscle weakness (generalized): Secondary | ICD-10-CM | POA: Diagnosis not present

## 2021-07-01 DIAGNOSIS — G309 Alzheimer's disease, unspecified: Secondary | ICD-10-CM | POA: Diagnosis not present

## 2021-07-01 DIAGNOSIS — E118 Type 2 diabetes mellitus with unspecified complications: Secondary | ICD-10-CM | POA: Diagnosis not present

## 2021-07-01 DIAGNOSIS — I69319 Unspecified symptoms and signs involving cognitive functions following cerebral infarction: Secondary | ICD-10-CM | POA: Diagnosis not present

## 2021-07-01 DIAGNOSIS — M6258 Muscle wasting and atrophy, not elsewhere classified, other site: Secondary | ICD-10-CM | POA: Diagnosis not present

## 2021-07-01 DIAGNOSIS — I69320 Aphasia following cerebral infarction: Secondary | ICD-10-CM | POA: Diagnosis not present

## 2021-07-01 DIAGNOSIS — R531 Weakness: Secondary | ICD-10-CM | POA: Diagnosis not present

## 2021-07-01 DIAGNOSIS — D510 Vitamin B12 deficiency anemia due to intrinsic factor deficiency: Secondary | ICD-10-CM | POA: Diagnosis not present

## 2021-07-01 DIAGNOSIS — Z7401 Bed confinement status: Secondary | ICD-10-CM | POA: Diagnosis not present

## 2021-07-01 DIAGNOSIS — I679 Cerebrovascular disease, unspecified: Secondary | ICD-10-CM | POA: Diagnosis not present

## 2021-07-01 DIAGNOSIS — B351 Tinea unguium: Secondary | ICD-10-CM | POA: Diagnosis not present

## 2021-07-01 DIAGNOSIS — F01518 Vascular dementia, unspecified severity, with other behavioral disturbance: Secondary | ICD-10-CM | POA: Diagnosis not present

## 2021-07-01 DIAGNOSIS — I69359 Hemiplegia and hemiparesis following cerebral infarction affecting unspecified side: Secondary | ICD-10-CM | POA: Diagnosis not present

## 2021-07-01 DIAGNOSIS — L97421 Non-pressure chronic ulcer of left heel and midfoot limited to breakdown of skin: Secondary | ICD-10-CM | POA: Diagnosis not present

## 2021-07-01 DIAGNOSIS — F0280 Dementia in other diseases classified elsewhere without behavioral disturbance: Secondary | ICD-10-CM | POA: Diagnosis not present

## 2021-07-01 DIAGNOSIS — F5101 Primary insomnia: Secondary | ICD-10-CM | POA: Diagnosis not present

## 2021-07-01 DIAGNOSIS — Z89412 Acquired absence of left great toe: Secondary | ICD-10-CM | POA: Diagnosis not present

## 2021-07-01 DIAGNOSIS — L603 Nail dystrophy: Secondary | ICD-10-CM | POA: Diagnosis not present

## 2021-07-01 DIAGNOSIS — R1312 Dysphagia, oropharyngeal phase: Secondary | ICD-10-CM | POA: Diagnosis not present

## 2021-07-01 DIAGNOSIS — Z8673 Personal history of transient ischemic attack (TIA), and cerebral infarction without residual deficits: Secondary | ICD-10-CM | POA: Diagnosis not present

## 2021-07-01 DIAGNOSIS — E785 Hyperlipidemia, unspecified: Secondary | ICD-10-CM | POA: Diagnosis not present

## 2021-07-01 DIAGNOSIS — I10 Essential (primary) hypertension: Secondary | ICD-10-CM | POA: Diagnosis not present

## 2021-07-01 DIAGNOSIS — R278 Other lack of coordination: Secondary | ICD-10-CM | POA: Diagnosis not present

## 2021-07-01 LAB — CBC WITH DIFFERENTIAL/PLATELET
Abs Immature Granulocytes: 0.09 10*3/uL — ABNORMAL HIGH (ref 0.00–0.07)
Basophils Absolute: 0.1 10*3/uL (ref 0.0–0.1)
Basophils Relative: 1 %
Eosinophils Absolute: 0.1 10*3/uL (ref 0.0–0.5)
Eosinophils Relative: 2 %
HCT: 39.6 % (ref 39.0–52.0)
Hemoglobin: 13.3 g/dL (ref 13.0–17.0)
Immature Granulocytes: 1 %
Lymphocytes Relative: 45 %
Lymphs Abs: 3.7 10*3/uL (ref 0.7–4.0)
MCH: 28.4 pg (ref 26.0–34.0)
MCHC: 33.6 g/dL (ref 30.0–36.0)
MCV: 84.6 fL (ref 80.0–100.0)
Monocytes Absolute: 0.8 10*3/uL (ref 0.1–1.0)
Monocytes Relative: 10 %
Neutro Abs: 3.4 10*3/uL (ref 1.7–7.7)
Neutrophils Relative %: 41 %
Platelets: 474 10*3/uL — ABNORMAL HIGH (ref 150–400)
RBC: 4.68 MIL/uL (ref 4.22–5.81)
RDW: 12.8 % (ref 11.5–15.5)
WBC: 8.3 10*3/uL (ref 4.0–10.5)
nRBC: 0 % (ref 0.0–0.2)

## 2021-07-01 LAB — BASIC METABOLIC PANEL
Anion gap: 12 (ref 5–15)
BUN: 39 mg/dL — ABNORMAL HIGH (ref 8–23)
CO2: 20 mmol/L — ABNORMAL LOW (ref 22–32)
Calcium: 9.4 mg/dL (ref 8.9–10.3)
Chloride: 103 mmol/L (ref 98–111)
Creatinine, Ser: 1.9 mg/dL — ABNORMAL HIGH (ref 0.61–1.24)
GFR, Estimated: 35 mL/min — ABNORMAL LOW (ref >60)
Glucose, Bld: 116 mg/dL — ABNORMAL HIGH (ref 70–99)
Potassium: 3.9 mmol/L (ref 3.5–5.1)
Sodium: 135 mmol/L (ref 135–145)

## 2021-07-01 LAB — GLUCOSE, CAPILLARY
Glucose-Capillary: 89 mg/dL (ref 70–99)
Glucose-Capillary: 133 mg/dL — ABNORMAL HIGH (ref 70–99)

## 2021-07-01 MED ORDER — AMLODIPINE BESYLATE 10 MG PO TABS: 10.0000 mg | ORAL_TABLET | Freq: Every day | ORAL | Status: DC

## 2021-07-01 MED ORDER — INSULIN ASPART 100 UNIT/ML IJ SOLN: 0.0000 [IU] | INTRAMUSCULAR | 11 refills | Status: AC

## 2021-07-01 MED ORDER — ASPIRIN 325 MG PO TBEC: 325.0000 mg | DELAYED_RELEASE_TABLET | Freq: Every day | ORAL | 0 refills | Status: AC

## 2021-07-01 MED ORDER — AMLODIPINE BESYLATE 5 MG PO TABS
5.0000 mg | ORAL_TABLET | Freq: Once | ORAL | Status: AC
  Administered 2021-07-01: 5 mg via ORAL
  Filled 2021-07-01: qty 1

## 2021-07-01 MED ORDER — CLOPIDOGREL BISULFATE 75 MG PO TABS: 75.0000 mg | ORAL_TABLET | Freq: Every day | ORAL | Status: AC

## 2021-07-01 MED ORDER — TRAZODONE HCL 150 MG PO TABS
150.0000 mg | ORAL_TABLET | Freq: Every evening | ORAL | 0 refills | Status: AC | PRN
Start: 2021-07-01 — End: 2021-12-20

## 2021-07-01 MED ORDER — CARVEDILOL 3.125 MG PO TABS: 3.1250 mg | ORAL_TABLET | Freq: Two times a day (BID) | ORAL | Status: DC

## 2021-07-01 MED ORDER — CARVEDILOL 3.125 MG PO TABS
3.1250 mg | ORAL_TABLET | Freq: Two times a day (BID) | ORAL | Status: DC
  Administered 2021-07-01: 3.125 mg via ORAL
  Filled 2021-07-01: qty 1

## 2021-07-01 MED ORDER — ENSURE ENLIVE PO LIQD: 237.0000 mL | Freq: Two times a day (BID) | ORAL | 12 refills | Status: AC

## 2021-07-01 NOTE — Discharge Summary (Signed)
Physician Discharge Summary  Mumin Denomme SUO:[id number] DOB: Feb 08, 1941 DOA: 06/23/2021  PCP: [name], MD  Admit date: 06/23/2021 Discharge date: 07/01/2021  Admitted From: Home Discharge disposition: Skilled nursing facility   Code Status: Full Code   Discharge Diagnosis:   Principal Problem:   CVA (cerebral vascular accident) [facility]) Active Problems:   Hypertension associated with diabetes ([place])   T2DM (type 2 diabetes mellitus) ([place])   Hyperlipidemia associated with type 2 diabetes mellitus ([place])   CKD (chronic kidney disease), stage III ([place])   Glaucoma   Dementia without behavioral disturbance ([place])   Abdominal distension   Chronic diastolic CHF (congestive heart failure) ([place])   Seizure-like activity ([place])   Acute metabolic encephalopathy    Chief Complaint  Patient presents with   Weakness    Brief narrative: David Ford is a 81 y.o. male with PMH significant for DM2, HTN, dementia, prior history of prostate cancer, recurrent hospitalizations for hypoglycemia, most recent fall few weeks ago. Patient was brought to the ED on 06/23/2021 for increased confusion, aphasia and seizure-like activity, ongoing for 24 hours.  He is wife also reported an episode where he grabbed the side of the table repeatedly few times and was shaking at that time.  MRI showed punctate acute infarct in the left frontal lobe and remote lacunar infarct in the left basal ganglia and moderate chronic microvascular disease. Admitted to hospitalist service Neurology consulted  Subjective: Patient was seen and examined this morning.  Pleasant elderly African-American male.  Alert, awake, able to track my presence.  Pleasantly confused.  Not oriented to time, place and person.  Not able to answer any other questions for me. Noted for the last 24 hours.  Hospital course: Acute left frontal lobe stroke -Presented with increased confusion, aphasia, seizure-like activity.   -MRI on admission  showed punctate acute infarct in the left frontal lobe as well as remote lacunar infarct in left basal ganglia moderate chronic microvascular disease along with cerebral atrophy.  -Neurology consult appreciated. He underwent CVA work-up.  MRA head negative for large vessel occlusion.  2D echo shows EF 32-76%, grade 1 diastolic dysfunction. Carotid Doppler without significant stenosis. Lipid panel showed an LDL of 154, A1c 7.2 -EEG was negative for seizure activity. -PT eval was obtained.  SNF recommended -Per neurology recommendation, patient is currently on aspirin 325 mg daily and Plavix 75 mg daily for 3 months followed by aspirin alone. -Continue Lipitor.  Acute metabolic encephalopathy -Multifactorial: Stroke, underlying dementia, prolonged hospitalization, probably recurrent hypoglycemic episodes -Continues to remain confused and disoriented. Per family, patient had a progressive decline in his mental status even prior to admission. -Mental status expected to improve somewhat once he is out of the hospital and in less acute rehab setting.  Chronic gastric CHF  Essential hypertension -Resumed Coreg and amlodipine.  Not on diuretics.   CKD 3B  -creatinine close to baseline.  Hyperlipidemia -continue statin   Type 2 diabetes mellitus -A1c 7.2 on 06/24/2021 -Currently on Lantus 5 units daily and sliding scale insulin with Accu-Cheks.  Recent Labs  Lab 06/30/21 1520 06/30/21 2005 06/30/21 2355 07/01/21 0350 07/01/21 0758  GLUCAP 220* 431* 271* 89 133*   Glaucoma  -Continue home meds    BPH -continue Flomax  Mobility: Encourage ambulation Living condition: Was living at home Goals of care:   Code Status: Full Code  Nutritional status: Body mass index is 24.99 kg/m.  Nutrition Problem: Inadequate oral intake Etiology: decreased appetite Signs/Symptoms: meal completion <  50%  Discharge Medications:   Allergies as of 07/01/2021   No Known Allergies      Medication  List     STOP taking these medications    Azelastine HCl 137 MCG/SPRAY Soln   FreeStyle Libre 2 [employer] 2 Sensor Misc   NovoLOG FlexPen 100 UNIT/ML FlexPen Generic drug: insulin aspart Replaced by: insulin aspart 100 UNIT/ML injection       TAKE these medications    amLODipine 10 MG tablet Commonly known as: NORVASC Take 1 tablet (10 mg total) by mouth daily.   aspirin 325 MG EC tablet Take 1 tablet (325 mg total) by mouth daily. Start taking on: July 02, 2021   atorvastatin 10 MG tablet Commonly known as: LIPITOR Take 1 tablet (10 mg total) by mouth daily.   brimonidine 0.2 % ophthalmic solution Commonly known as: ALPHAGAN Place 1 drop into both eyes in the morning and at bedtime.   carvedilol 3.125 MG tablet Commonly known as: COREG Take 1 tablet (3.125 mg total) by mouth 2 (two) times daily with a meal.   clopidogrel 75 MG tablet Commonly known as: PLAVIX Take 1 tablet (75 mg total) by mouth daily. Start taking on: July 02, 2021   dorzolamide-timolol 22.3-6.8 MG/ML ophthalmic solution Commonly known as: COSOPT Place 1 drop into both eyes 2 (two) times daily.   feeding supplement Liqd Take 237 mLs by mouth 2 (two) times daily between meals.   fluticasone 50 MCG/ACT nasal spray Commonly known as: FLONASE Place 2 sprays into both nostrils daily.   insulin aspart 100 UNIT/ML injection Commonly known as: novoLOG Inject 0-9 Units into the skin every 4 (four) hours. Replaces: NovoLOG FlexPen 100 UNIT/ML FlexPen   Lantus SoloStar 100 UNIT/ML Solostar Pen Generic drug: insulin glargine Inject 5 Units into the skin daily.   latanoprost 0.005 % ophthalmic solution Commonly known as: XALATAN INSTILL 1 DROP INTO BOTH EYES AT BEDTIME   multivitamin with minerals Tabs tablet Take 1 tablet by mouth daily.   pantoprazole 40 MG tablet Commonly known as: PROTONIX Take 1 tablet (40 mg total) by mouth 2 (two) times daily. What  changed:  when to take this reasons to take this   Pen Needles 32G X 5 MM Misc 1 application by Does not apply route 4 (four) times daily as needed (to inject insulin).   polyethylene glycol 17 g packet Commonly known as: MiraLax Take 17 g by mouth daily as needed for mild constipation.   tamsulosin 0.4 MG Caps capsule Commonly known as: FLOMAX Take 1 capsule (0.4 mg total) by mouth daily. What changed: when to take this   traZODone 150 MG tablet Commonly known as: DESYREL TAKE 2 TABLETS AT BEDTIME What changed:  how much to take when to take this reasons to take this   triamcinolone ointment 0.5 % Commonly known as: KENALOG APPLY TOPICALLY 2 TIMES DAILY What changed: See the new instructions.   True Metrix Blood Glucose Test test strip Generic drug: glucose blood TEST BLOOD SUGAR FOUR TIMES DAILY AND AS NEEDED What changed: See the new instructions.   True Metrix Meter w/Device Kit USE AS DIRECTED   TRUEplus Lancets 33G Misc CHECK BLOOD SUGAR FOUR TIMES DAILY  AND AS NEEDED What changed: See the new instructions.   Vitamin D3 25 MCG tablet Commonly known as: Vitamin D Take 1,000 Units by mouth daily.        Wound care:   Incision (Closed) 07/03/15 Foot Left (Active)  Date First Assessed/Time First Assessed: 07/03/15 1839   Location: Foot  Location Orientation: Left    Assessments 07/03/2015  7:47 PM 07/15/2015  8:33 AM  Dressing Type Compression wrap;Gauze (Comment) Compression wrap  Dressing Clean;Dry;Intact Clean;Dry;Intact  Dressing Change Frequency -- Daily  Site / Wound Assessment Dressing in place / Unable to assess --  Drainage Amount None --     No Linked orders to display     Incision (Closed) 07/14/15 Arm Left (Active)  Date First Assessed/Time First Assessed: 07/14/15 1225   Location: Arm  Location Orientation: Left    Assessments 07/14/2015 12:35 PM 07/15/2015  8:33 AM  Dressing Type Liquid skin adhesive Liquid skin adhesive  Dressing  Clean;Dry;Intact Clean;Dry;Intact  Site / Wound Assessment Clean;Dry Clean;Dry  Closure Skin glue Skin glue  Drainage Amount None None     No Linked orders to display    Discharge Instructions:   Discharge Instructions     Call MD for:  difficulty breathing, headache or visual disturbances   Complete by: As directed    Call MD for:  extreme fatigue   Complete by: As directed    Call MD for:  hives   Complete by: As directed    Call MD for:  persistant dizziness or light-headedness   Complete by: As directed    Call MD for:  persistant nausea and vomiting   Complete by: As directed    Call MD for:  severe uncontrolled pain   Complete by: As directed    Call MD for:  temperature >100.4   Complete by: As directed    Diet general   Complete by: As directed    Discharge instructions   Complete by: As directed    Discharge instructions for diabetes mellitus: Check blood sugar 3 times a day and bedtime at home. If blood sugar running above 200 or less than 70 please call your MD to adjust insulin. If you notice signs and symptoms of hypoglycemia (low blood sugar) like jitteriness, confusion, thirst, tremor and sweating, please check blood sugar, drink sugary drink/biscuits/sweets to increase sugar level and call MD or return to ER.    General discharge instructions:  Follow with Primary MD [name], MD in 7 days   Get CBC/BMP checked in next visit within 1 week by PCP or SNF MD. (We routinely change or add medications that can affect your baseline labs and fluid status, therefore we recommend that you get the mentioned basic workup next visit with your PCP, your PCP may decide not to get them or add new tests based on their clinical decision)  On your next visit with your PCP, please get your medicines reviewed and adjusted.  Please request your PCP  to go over all hospital tests, procedures, radiology results at the follow up, please get all Hospital records sent to  your PCP by signing hospital release before you go home.  Activity: As tolerated with Full fall precautions use walker/cane & assistance as needed  Avoid using any recreational substances like cigarette, tobacco, alcohol, or non-prescribed drug.  If you experience worsening of your admission symptoms, develop shortness of breath, life threatening emergency, suicidal or homicidal thoughts you must seek medical attention immediately by calling 911 or calling your MD immediately  if symptoms less severe.  You must read complete instructions/literature along with all the possible adverse reactions/side effects for all the medicines you take and that have been prescribed to you. Take any new medicine only after you  have completely understood and accepted all the possible adverse reactions/side effects.   Do not drive, operate heavy machinery, perform activities at heights, swimming or participation in water activities or provide baby sitting services if your were admitted for syncope or siezures until you have seen by Primary MD or a Neurologist and advised to do so again.  Do not drive when taking Pain medications.  Do not take more than prescribed Pain, Sleep and Anxiety Medications  Wear Seat belts while driving.  Please note You were cared for by a hospitalist during your hospital stay. If you have any questions about your discharge medications or the care you received while you were in the hospital after you are discharged, you can call the unit and asked to speak with the hospitalist on call if the hospitalist that took care of you is not available. Once you are discharged, your primary care physician will handle any further medical issues. Please note that NO REFILLS for any discharge medications will be authorized once you are discharged, as it is imperative that you return to your primary care physician (or establish a relationship with a primary care physician if you do not have one) for your  aftercare needs so that they can reassess your need for medications and monitor your lab values.   Increase activity slowly   Complete by: As directed        Follow ups:    Contact information for follow-up providers     [facility]. Schedule an appointment as soon as possible for a visit in 1 month(s).   Specialty: Neurology Why: stroke clinic Contact information: [address] [place]        [name], MD Follow up.   Specialty: Family Medicine Contact information: [address] [place] [address] [phone]              Contact information for after-discharge care     Destination     [facility] .   Service: Skilled Nursing Contact information: [place] [phone]                     Discharge Exam:   Vitals:   06/30/21 2031 06/30/21 2356 07/01/21 0352 07/01/21 0757  BP: (!) 152/80 116/62 (!) 162/76 (!) 185/100  Pulse: 87 77 78 86  Resp: _0 Temp: 98.7 F (37.1 C) 98 F (36.7 C) (!) 97.5 F (36.4 C) 98.3 F (36.8 C)  TempSrc: Axillary Oral Oral   SpO2: 100% 100% 100% 100%  Weight: 85.9 kg  85.9 kg   Height: _1  (1.854 m)       Body mass index is 24.99 kg/m.  General exam: Pleasant, elderly African-American male.  Not in distress Skin: No rashes, lesions or ulcers. HEENT: Atraumatic, normocephalic, no obvious bleeding Lungs: Clear to auscultation bilaterally CVS: Regular rate and rhythm, no murmur GI/Abd soft, nontender, nondistended, bowel sound present CNS: Alert, awake, pleasantly confused.  Not oriented to place, person and time Psychiatry: Pleasantly confused Extremities: No pedal edema, no calf tenderness  Time coordinating discharge: 35 minutes   The results of significant diagnostics from this hospitalization (including imaging, microbiology, ancillary and laboratory)  are listed below for reference.    Procedures and Diagnostic Studies:   DG Chest 1 View  Result Date: 06/24/2021 CLINICAL DATA:  Seizure, stroke EXAM: CHEST  1 VIEW COMPARISON:  06/15/2021  FINDINGS: Single frontal view of the chest demonstrates an unremarkable cardiac silhouette. No airspace disease, effusion, or pneumothorax. No acute bony abnormality. IMPRESSION: 1. No acute intrathoracic process. Electronically Signed   By: Randa Ngo M.D.   On: 06/24/2021 22:40   DG Abd 1 View  Result Date: 06/24/2021 CLINICAL DATA:  Seizure, stroke EXAM: ABDOMEN - 1 VIEW COMPARISON:  07/17/2015 FINDINGS: Supine frontal views of the abdomen and pelvis are obtained. Hemidiaphragms are excluded by collimation. Bowel gas pattern is unremarkable without obstruction or ileus. No masses or abnormal calcifications. Radiotherapy seeds and surgical clips are seen within the prostate bed. Penile prosthesis is noted. No acute bony abnormalities. IMPRESSION: 1. Unremarkable bowel gas pattern. Electronically Signed   By: Randa Ngo M.D.   On: 06/24/2021 22:41   CT Head Wo Contrast  Result Date: 06/23/2021 CLINICAL DATA:  Mental status change, unknown cause. Generalized weakness. EXAM: CT HEAD WITHOUT CONTRAST TECHNIQUE: Contiguous axial images were obtained from the base of the skull through the vertex without intravenous contrast. COMPARISON:  01/05/2020 FINDINGS: Brain: Stable cerebral atrophy. No evidence for acute hemorrhage, mass lesion, midline shift, hydrocephalus or large infarct. Stable low-density in the periventricular and subcortical white matter is suggestive for chronic changes. Stable low-density area in the left basal ganglia could represent an old lacune infarct. Vascular: Calcification involving the right vertebral artery. Skull: Normal. Negative for fracture or focal lesion. Sinuses/Orbits: Possible polyp or retention cyst in the right maxillary sinus but incompletely imaged. Mild disease in left  maxillary sinus. Other: None. IMPRESSION: 1. No acute intracranial abnormality. 2. Stable atrophy and evidence for chronic small vessel ischemic changes. Electronically Signed   By: Markus Daft M.D.   On: 06/23/2021 13:17   MR Brain W and Wo Contrast  Result Date: 06/24/2021 CLINICAL DATA:  Seizure, new-onset, no history of trauma EXAM: MRI HEAD WITHOUT AND WITH CONTRAST TECHNIQUE: Multiplanar, multiecho pulse sequences of the brain and surrounding structures were obtained without and with intravenous contrast. CONTRAST:  1m GADAVIST GADOBUTROL 1 MMOL/ML IV SOLN COMPARISON:  Same day CT head. FINDINGS: Motion limited study. Brain: Punctate acute infarct in the left frontal lobe (series 3 and 300, image 30). No mass effect. Remote lacunar infarct in the left basal ganglia. Additional patchy T2/FLAIR hyperintensity within the white matter, nonspecific but compatible with chronic microvascular ischemic disease. Generalized atrophy. No evidence of acute hemorrhage, hydrocephalus, mass lesion, midline shift, or extra-axial fluid collection. The hippocampi appear to be within normal limits and symmetric in size/signal on the mildly limited coronal thin sequences. No evidence of abnormal enhancement. Vascular: Major arterial flow voids are maintained skull base. Skull and upper cervical spine: Normal marrow signal. Sinuses/Orbits: Mild paranasal sinus mucosal thickening. Unremarkable orbits. Other: No mastoid effusions. IMPRESSION: 1. Punctate acute infarct in the left frontal lobe. 2. Remote lacunar infarct in left basal ganglia and moderate chronic microvascular disease. 3.  Cerebral atrophy (ICD10-G31.9). Electronically Signed   By: FMargaretha SheffieldM.D.   On: 06/24/2021 18:49     Labs:   Basic Metabolic Panel: Recent Labs  Lab 06/24/21 2325 06/26/21 0217 06/27/21 0139 06/30/21 0435 07/01/21 0223  NA 132* 136 134* 137 135  K 4.5 4.2 4.0 4.1 3.9  CL 102 105 105 106 103  CO2 _0 21* 20*  GLUCOSE  240* 147* 172* 165* 116*  BUN _1 37* 39*  CREATININE 1.75* 1.81* 1.77* 1.97* 1.90*  CALCIUM 9.1 9.0 8.7* 8.8* 9.4  MG 1.9  --   --   --   --  PHOS 3.3  --   --   --   --    GFR Estimated Creatinine Clearance: 35.6 mL/min (A) (by C-G formula based on SCr of 1.9 mg/dL (H)). Liver Function Tests: Recent Labs  Lab 06/24/21 2325  AST 17  ALT 14  ALKPHOS 78  BILITOT 0.5  PROT 6.2*  ALBUMIN 3.1*   No results for input(s): LIPASE, AMYLASE in the last 168 hours. Recent Labs  Lab 06/26/21 0217  AMMONIA 39*   Coagulation profile No results for input(s): INR, PROTIME in the last 168 hours.  CBC: Recent Labs  Lab 06/24/21 2325 06/26/21 0217 06/30/21 0435 07/01/21 0223  WBC 6.9 7.8 7.1 8.3  NEUTROABS 3.6  --   --  3.4  HGB 13.0 13.0 12.8* 13.3  HCT 39.0 38.9* 38.2* 39.6  MCV 87.2 86.6 86.6 84.6  PLT 360 364 375 474*   Cardiac Enzymes: Recent Labs  Lab 06/24/21 2325  CKTOTAL 227   BNP: Invalid input(s): POCBNP CBG: Recent Labs  Lab 06/30/21 1520 06/30/21 2005 06/30/21 2355 07/01/21 0350 07/01/21 0758  GLUCAP 220* 431* 271* 89 133*   D-Dimer No results for input(s): DDIMER in the last 72 hours. Hgb A1c No results for input(s): HGBA1C in the last 72 hours. Lipid Profile No results for input(s): CHOL, HDL, LDLCALC, TRIG, CHOLHDL, LDLDIRECT in the last 72 hours. Thyroid function studies No results for input(s): TSH, T4TOTAL, T3FREE, THYROIDAB in the last 72 hours.  Invalid input(s): FREET3 Anemia work up No results for input(s): VITAMINB12, FOLATE, FERRITIN, TIBC, IRON, RETICCTPCT in the last 72 hours. Microbiology Recent Results (from the past 240 hour(s))  Resp Panel by RT-PCR (Flu A&B, Covid) Nasopharyngeal Swab     Status: None   Collection Time: 06/23/21 12:43 PM   Specimen: Nasopharyngeal Swab; Nasopharyngeal(NP) swabs in vial transport medium  Result Value Ref Range Status   SARS Coronavirus 2 by RT PCR NEGATIVE NEGATIVE Final    Comment:  (NOTE) SARS-CoV-2 target nucleic acids are NOT DETECTED.  The SARS-CoV-2 RNA is generally detectable in upper respiratory specimens during the acute phase of infection. The lowest concentration of SARS-CoV-2 viral copies this assay can detect is 138 copies/mL. A negative result does not preclude SARS-Cov-2 infection and should not be used as the sole basis for treatment or other patient management decisions. A negative result may occur with  improper specimen collection/handling, submission of specimen other than nasopharyngeal swab, presence of viral mutation(s) within the areas targeted by this assay, and inadequate number of viral copies(<138 copies/mL). A negative result must be combined with clinical observations, patient history, and epidemiological information. The expected result is Negative.  Fact Sheet for Patients:  EntrepreneurPulse.com.au  Fact Sheet for Healthcare Providers:  IncredibleEmployment.be  This test is no t yet approved or cleared by the [place] FDA and  has been authorized for detection and/or diagnosis of SARS-CoV-2 by FDA under an Emergency Use Authorization (EUA). This EUA will remain  in effect (meaning this test can be used) for the duration of the COVID-19 declaration under Section 564(b)(1) of the Act, 21 U.S.C.section 360bbb-3(b)(1), unless the authorization is terminated  or revoked sooner.       Influenza A by PCR NEGATIVE NEGATIVE Final   Influenza B by PCR NEGATIVE NEGATIVE Final    Comment: (NOTE) The Xpert Xpress SARS-CoV-2/FLU/RSV plus assay is intended as an aid in the diagnosis of influenza from Nasopharyngeal swab specimens and should not be used as a sole basis for treatment. Nasal washings  and aspirates are unacceptable for Xpert Xpress SARS-CoV-2/FLU/RSV testing.  Fact Sheet for Patients: EntrepreneurPulse.com.au  Fact Sheet for Healthcare  Providers: IncredibleEmployment.be  This test is not yet approved or cleared by the [place] FDA and has been authorized for detection and/or diagnosis of SARS-CoV-2 by FDA under an Emergency Use Authorization (EUA). This EUA will remain in effect (meaning this test can be used) for the duration of the COVID-19 declaration under Section 564(b)(1) of the Act, 21 U.S.C. section 360bbb-3(b)(1), unless the authorization is terminated or revoked.  Performed at [facility], [place] [address], [place], [place] [address]   Resp Panel by RT-PCR (Flu A&B, Covid) Nasopharyngeal Swab     Status: None   Collection Time: 06/30/21 12:33 PM   Specimen: Nasopharyngeal Swab; Nasopharyngeal(NP) swabs in vial transport medium  Result Value Ref Range Status   SARS Coronavirus 2 by RT PCR NEGATIVE NEGATIVE Final    Comment: (NOTE) SARS-CoV-2 target nucleic acids are NOT DETECTED.  The SARS-CoV-2 RNA is generally detectable in upper respiratory specimens during the acute phase of infection. The lowest concentration of SARS-CoV-2 viral copies this assay can detect is 138 copies/mL. A negative result does not preclude SARS-Cov-2 infection and should not be used as the sole basis for treatment or other patient management decisions. A negative result may occur with  improper specimen collection/handling, submission of specimen other than nasopharyngeal swab, presence of viral mutation(s) within the areas targeted by this assay, and inadequate number of viral copies(<138 copies/mL). A negative result must be combined with clinical observations, patient history, and epidemiological information. The expected result is Negative.  Fact Sheet for Patients:  EntrepreneurPulse.com.au  Fact Sheet for Healthcare Providers:  IncredibleEmployment.be  This test is no t yet approved or cleared by the [place] FDA and  has been authorized for  detection and/or diagnosis of SARS-CoV-2 by FDA under an Emergency Use Authorization (EUA). This EUA will remain  in effect (meaning this test can be used) for the duration of the COVID-19 declaration under Section 564(b)(1) of the Act, 21 U.S.C.section 360bbb-3(b)(1), unless the authorization is terminated  or revoked sooner.       Influenza A by PCR NEGATIVE NEGATIVE Final   Influenza B by PCR NEGATIVE NEGATIVE Final    Comment: (NOTE) The Xpert Xpress SARS-CoV-2/FLU/RSV plus assay is intended as an aid in the diagnosis of influenza from Nasopharyngeal swab specimens and should not be used as a sole basis for treatment. Nasal washings and aspirates are unacceptable for Xpert Xpress SARS-CoV-2/FLU/RSV testing.  Fact Sheet for Patients: EntrepreneurPulse.com.au  Fact Sheet for Healthcare Providers: IncredibleEmployment.be  This test is not yet approved or cleared by the [place] FDA and has been authorized for detection and/or diagnosis of SARS-CoV-2 by FDA under an Emergency Use Authorization (EUA). This EUA will remain in effect (meaning this test can be used) for the duration of the COVID-19 declaration under Section 564(b)(1) of the Act, 21 U.S.C. section 360bbb-3(b)(1), unless the authorization is terminated or revoked.  Performed at [facility], [place] [address], [place] [place], [place] [address]      Signed: Terrilee Croak  [facility] 07/01/2021, 10:42 AM

## 2021-07-01 NOTE — TOC Transition Note (Signed)
Transition of Care [facility]) - CM/SW Discharge Note   Patient Details  Name: David Ford MRN: [id number] Date of Birth: January 03, 1941  Transition of [facility]) CM/SW Contact:  Geralynn Ochs, LCSW Phone Number: 07/01/2021, 10:51 AM   Clinical Narrative:   Nurse to call report to [phone], Room 603P    Final next level of care: Skilled Nursing Facility Barriers to Discharge: Barriers Resolved   Patient Goals and CMS Choice Patient states their goals for this hospitalization and ongoing recovery are:: Pt is disoriented at this time and unable to participate in goal setting. CMS Medicare.gov Compare Post Acute Care list provided to:: Patient Represenative (must comment) (Spouse) Choice offered to / list presented to : Spouse  Discharge Placement              Patient chooses bed at: [facility] Patient to be transferred to facility by: [place] Name of family member notified: [name] Patient and family notified of of transfer: 07/01/21  Discharge Plan and Services     Post Acute Care Choice: Nursing Home, [place]                               Social Determinants of Health (SDOH) Interventions     Readmission Risk Interventions No flowsheet data found.

## 2021-07-02 DIAGNOSIS — G9341 Metabolic encephalopathy: Secondary | ICD-10-CM | POA: Diagnosis not present

## 2021-07-02 DIAGNOSIS — I69320 Aphasia following cerebral infarction: Secondary | ICD-10-CM | POA: Diagnosis not present

## 2021-07-02 DIAGNOSIS — E1159 Type 2 diabetes mellitus with other circulatory complications: Secondary | ICD-10-CM | POA: Diagnosis not present

## 2021-07-02 DIAGNOSIS — R5381 Other malaise: Secondary | ICD-10-CM | POA: Diagnosis not present

## 2021-07-02 DIAGNOSIS — F0150 Vascular dementia without behavioral disturbance: Secondary | ICD-10-CM | POA: Diagnosis not present

## 2021-07-02 DIAGNOSIS — E785 Hyperlipidemia, unspecified: Secondary | ICD-10-CM | POA: Diagnosis not present

## 2021-07-02 DIAGNOSIS — I69319 Unspecified symptoms and signs involving cognitive functions following cerebral infarction: Secondary | ICD-10-CM | POA: Diagnosis not present

## 2021-07-02 DIAGNOSIS — I69359 Hemiplegia and hemiparesis following cerebral infarction affecting unspecified side: Secondary | ICD-10-CM | POA: Diagnosis not present

## 2021-07-02 DIAGNOSIS — I69398 Other sequelae of cerebral infarction: Secondary | ICD-10-CM | POA: Diagnosis not present

## 2021-07-03 DIAGNOSIS — D510 Vitamin B12 deficiency anemia due to intrinsic factor deficiency: Secondary | ICD-10-CM | POA: Diagnosis not present

## 2021-07-03 DIAGNOSIS — I10 Essential (primary) hypertension: Secondary | ICD-10-CM | POA: Diagnosis not present

## 2021-07-05 ENCOUNTER — Encounter: Payer: Self-pay | Admitting: Physician Assistant

## 2021-07-05 ENCOUNTER — Ambulatory Visit: Payer: Medicare HMO | Admitting: Family Medicine

## 2021-07-05 ENCOUNTER — Other Ambulatory Visit: Payer: Self-pay

## 2021-07-05 ENCOUNTER — Ambulatory Visit (INDEPENDENT_AMBULATORY_CARE_PROVIDER_SITE_OTHER): Payer: Medicare HMO | Admitting: Physician Assistant

## 2021-07-05 ENCOUNTER — Inpatient Hospital Stay: Payer: Medicare HMO | Admitting: Family Medicine

## 2021-07-05 VITALS — BP 151/84 | HR 69 | Resp 18 | Ht 73.0 in | Wt 180.0 lb

## 2021-07-05 DIAGNOSIS — F01518 Vascular dementia, unspecified severity, with other behavioral disturbance: Secondary | ICD-10-CM

## 2021-07-05 DIAGNOSIS — G309 Alzheimer's disease, unspecified: Secondary | ICD-10-CM

## 2021-07-05 DIAGNOSIS — F0280 Dementia in other diseases classified elsewhere without behavioral disturbance: Secondary | ICD-10-CM

## 2021-07-05 MED ORDER — SERTRALINE HCL 25 MG PO TABS: 25.0000 mg | ORAL_TABLET | Freq: Every day | ORAL | 3 refills | Status: AC

## 2021-07-05 NOTE — Patient Instructions (Signed)
It was a pleasure to see you today at our office.   Recommendations:  Meds: Follow up in 1-2  via video  Start sertraline 25 mg daily for mood  Will let Dr. Jerline Pain know about need for ALF after discharge from SNF for safety    RECOMMENDATIONS FOR ALL PATIENTS WITH MEMORY PROBLEMS: 1. Continue to exercise (Recommend 30 minutes of walking everyday, or 3 hours every week) 2. Increase social interactions - continue going to [place] and enjoy social gatherings with friends and family 3. Eat healthy, avoid fried foods and eat more fruits and vegetables 4. Maintain adequate blood pressure, blood sugar, and blood cholesterol level. Reducing the risk of stroke and cardiovascular disease also helps promoting better memory. 5. Avoid stressful situations. Live a simple life and avoid aggravations. Organize your time and prepare for the next day in anticipation. 6. Sleep well, avoid any interruptions of sleep and avoid any distractions in the bedroom that may interfere with adequate sleep quality 7. Avoid sugar, avoid sweets as there is a strong link between excessive sugar intake, diabetes, and cognitive impairment We discussed the Mediterranean diet, which has been shown to help patients reduce the risk of progressive memory disorders and reduces cardiovascular risk. This includes eating fish, eat fruits and green leafy vegetables, nuts like almonds and hazelnuts, walnuts, and also use olive oil. Avoid fast foods and fried foods as much as possible. Avoid sweets and sugar as sugar use has been linked to worsening of memory function.  There is always a concern of gradual progression of memory problems. If this is the case, then we may need to adjust level of care according to patient needs. Support, both to the patient and caregiver, should then be put into place.    The [employer]zheim[employer] Association is here all day, every day for people facing Alzheimers disease through our free 24/7 Helpline: [phone].  The Helpline provides reliable information and support to all those who need assistance, such as individuals living with memory loss, Alzheimer's or other dementia, caregivers, health care professionals and the public.  Our highly trained and knowledgeable staff can help you with: Understanding memory loss, dementia and Alzheimer's  Medications and other treatment options  General information about aging and brain health  Skills to provide quality care and to find the best care from professionals  Legal, financial and living-arrangement decisions Our Helpline also features: Confidential care consultation provided by master's level clinicians who can help with decision-making support, crisis assistance and education on issues families face every day  Help in a caller's preferred language using our translation service that features more than 200 languages and dialects  Referrals to local community programs, services and ongoing support     FALL PRECAUTIONS: Be cautious when walking. Scan the area for obstacles that may increase the risk of trips and falls. When getting up in the mornings, sit up at the edge of the bed for a few minutes before getting out of bed. Consider elevating the bed at the head end to avoid drop of blood pressure when getting up. Walk always in a well-lit room (use night lights in the walls). Avoid area rugs or power cords from appliances in the middle of the walkways. Use a walker or a cane if necessary and consider physical therapy for balance exercise. Get your eyesight checked regularly.  FINANCIAL OVERSIGHT: Supervision, especially oversight when making financial decisions or transactions is also recommended.  HOME SAFETY: Consider the safety of the kitchen when operating appliances  like stoves, microwave oven, and blender. Consider having supervision and share cooking responsibilities until no longer able to participate in those. Accidents with firearms and other hazards  in the house should be identified and addressed as well.   ABILITY TO BE LEFT ALONE: If patient is unable to contact 911 operator, consider using LifeLine, or when the need is there, arrange for someone to stay with patients. Smoking is a fire hazard, consider supervision or cessation. Risk of wandering should be assessed by caregiver and if detected at any point, supervision and safe proof recommendations should be instituted.  MEDICATION SUPERVISION: Inability to self-administer medication needs to be constantly addressed. Implement a mechanism to ensure safe administration of the medications.   DRIVING: Regarding driving, in patients with progressive memory problems, driving will be impaired. We advise to have someone else do the driving if trouble finding directions or if minor accidents are reported. Independent driving assessment is available to determine safety of driving.   If you are interested in the driving assessment, you can contact the following:  [employer] in [place]  [place] [phone] or [phone]      [place] refers to food and lifestyle choices that are based on the traditions of countries located on the [employer]. This way of eating has been shown to help prevent certain conditions and improve outcomes for people who have chronic diseases, like kidney disease and heart disease. What are tips for following this plan? Lifestyle  Cook and eat meals together with your family, when possible. Drink enough fluid to keep your urine clear or pale yellow. Be physically active every day. This includes: Aerobic exercise like running or swimming. Leisure activities like gardening, walking, or housework. Get 7-8 hours of sleep each night. If recommended by your health care provider, drink red wine in moderation.  This means 1 glass a day for nonpregnant women and 2 glasses a day for men. A glass of wine equals 5 oz (150 mL). Reading food labels  Check the serving size of packaged foods. For foods such as rice and pasta, the serving size refers to the amount of cooked product, not dry. Check the total fat in packaged foods. Avoid foods that have saturated fat or trans fats. Check the ingredients list for added sugars, such as corn syrup. Shopping  At the grocery store, buy most of your food from the areas near the walls of the store. This includes: Fresh fruits and vegetables (produce). Grains, beans, nuts, and seeds. Some of these may be available in unpackaged forms or large amounts (in bulk). Fresh seafood. Poultry and eggs. Low-fat dairy products. Buy whole ingredients instead of prepackaged foods. Buy fresh fruits and vegetables in-season from local farmers markets. Buy frozen fruits and vegetables in resealable bags. If you do not have access to quality fresh seafood, buy precooked frozen shrimp or canned fish, such as tuna, salmon, or sardines. Buy small amounts of raw or cooked vegetables, salads, or olives from the deli or salad bar at your store. Stock your pantry so you always have certain foods on hand, such as olive oil, canned tuna, canned tomatoes, rice, pasta, and beans. Cooking  Cook foods with extra-virgin olive oil instead of using butter or other vegetable oils. Have meat as a side dish, and have vegetables or grains as your main dish. This means having meat in small portions or adding small amounts  of meat to foods like pasta or stew. Use beans or vegetables instead of meat in common dishes like chili or lasagna. Experiment with different cooking methods. Try roasting or broiling vegetables instead of steaming or sauteing them. Add frozen vegetables to soups, stews, pasta, or rice. Add nuts or seeds for added healthy fat at each meal. You can add these to yogurt, salads, or  vegetable dishes. Marinate fish or vegetables using olive oil, lemon juice, garlic, and fresh herbs. Meal planning  Plan to eat 1 vegetarian meal one day each week. Try to work up to 2 vegetarian meals, if possible. Eat seafood 2 or more times a week. Have healthy snacks readily available, such as: Vegetable sticks with hummus. Greek yogurt. Fruit and nut trail mix. Eat balanced meals throughout the week. This includes: Fruit: 2-3 servings a day Vegetables: 4-5 servings a day Low-fat dairy: 2 servings a day Fish, poultry, or lean meat: 1 serving a day Beans and legumes: 2 or more servings a week Nuts and seeds: 1-2 servings a day Whole grains: 6-8 servings a day Extra-virgin olive oil: 3-4 servings a day Limit red meat and sweets to only a few servings a month What are my food choices? Mediterranean diet Recommended Grains: Whole-grain pasta. Brown rice. Bulgar wheat. Polenta. Couscous. Whole-wheat bread. [place]. Vegetables: Artichokes. Beets. Broccoli. Cabbage. Carrots. Eggplant. Green beans. Chard. Kale. Spinach. Onions. Leeks. Peas. Squash. Tomatoes. Peppers. Radishes. Fruits: Apples. Apricots. Avocado. Berries. Bananas. Cherries. Dates. Figs. Grapes. Lemons. Melon. Oranges. Peaches. Plums. Pomegranate. Meats and other protein foods: Beans. Almonds. Sunflower seeds. Pine nuts. Peanuts. [place]. Salmon. Scallops. Shrimp. Beaver. Tilapia. Clams. Oysters. Eggs. Dairy: Low-fat milk. Cheese. Greek yogurt. Beverages: Water. Red wine. Herbal tea. Fats and oils: Extra virgin olive oil. Avocado oil. Grape seed oil. Sweets and desserts: [place] yogurt with honey. Baked apples. Poached pears. Trail mix. Seasoning and other foods: Basil. Cilantro. Coriander. Cumin. Mint. Parsley. Sage. Rosemary. Tarragon. Garlic. Oregano. Thyme. Pepper. Balsalmic vinegar. Tahini. Hummus. Tomato sauce. Olives. Mushrooms. Limit these Grains: Prepackaged pasta or rice dishes. Prepackaged cereal with added  sugar. Vegetables: Deep fried potatoes (french fries). Fruits: Fruit canned in syrup. Meats and other protein foods: Beef. Pork. Lamb. Poultry with skin. Hot dogs. Berniece Salines. Dairy: Ice cream. Sour cream. Whole milk. Beverages: Juice. Sugar-sweetened soft drinks. Beer. Liquor and spirits. Fats and oils: Butter. Canola oil. Vegetable oil. Beef fat (tallow). Lard. Sweets and desserts: Cookies. Cakes. Pies. Candy. Seasoning and other foods: Mayonnaise. Premade sauces and marinades. The items listed may not be a complete list. Talk with your dietitian about what dietary choices are right for you. Summary The Mediterranean diet includes both food and lifestyle choices. Eat a variety of fresh fruits and vegetables, beans, nuts, seeds, and whole grains. Limit the amount of red meat and sweets that you eat. Talk with your health care provider about whether it is safe for you to drink red wine in moderation. This means 1 glass a day for nonpregnant women and 2 glasses a day for men. A glass of wine equals 5 oz (150 mL). This information is not intended to replace advice given to you by your health care provider. Make sure you discuss any questions you have with your health care provider. Document Released: 01/28/2016 Document Revised: 03/01/2016 Document Reviewed: 01/28/2016 Elsevier Interactive Patient Education  2017 [employer].

## 2021-07-05 NOTE — Progress Notes (Incomplete)
Chief Complaint:  David Ford is a 81 y.o. male who presents today for a TCM visit.  Assessment/Plan:  New/Acute Problems: ***  Chronic Problems Addressed Today: No problem-specific Assessment & Plan notes found for this encounter.   Patient has a {Desc; moderate/high:110033} level of medical decision making.     Subjective:  HPI:  Summary of Hospital admission: Reason for admission: Altered Mental status, stroke Date of admission: 06/23/2021 Date of discharge: 07/01/2021 Date of Interactive contact: N/A todays visit is within 2 business days Summary of Hospital course: Patient presented to the ED/09/2021 with confusion and seizure-like activity.  MRI in the ED showed acute infarct in left frontal lobe and remote lingular infarct in left basal ganglia.  This admitted for stroke work-up.  His MRA was negative.  Echo showed an EF of 40 to 50% with grade 1 diastolic dysfunction.  Carotid Dopplers were normal.  PT was consulted and patient was recommended to be discharged to SNF.  He was started on dual antiplatelet therapy with aspirin 325 mg daily and Plavix 75 mg daily.  3 months followed by aspirin alone.  He was continued on Lipitor.  Improved clinically and was discharged to SNF on hospital day 8.  Interim history:  ***  ROS: ***, otherwise a complete review of systems was negative.   PMH:  The following were reviewed and entered/updated in epic: Past Medical History:  Diagnosis Date   AKI (acute kidney injury) ([place]) 06/18/2015   Cancer ([place])    hx of prostate cancer   Chronic kidney disease    Diabetes mellitus without complication ([place])    DKA (diabetic ketoacidoses) 06/18/2015   Glaucoma    Hyperlipidemia    Hypertension    Patient Active Problem List   Diagnosis Date Noted   Seizure-like activity ([place]) 48/25/0037   Acute metabolic encephalopathy 04/88/8916   CVA (cerebral vascular accident) ([place]) 06/24/2021   Abdominal distension 06/24/2021   Chronic  diastolic CHF (congestive heart failure) ([place]) 06/24/2021   Acute-on-chronic kidney injury ([place]) 06/16/2021   Anemia 06/16/2021   Hypoglycemia 06/15/2021   Dementia without behavioral disturbance ([place]) 06/08/2021   Esophagitis 01/06/2020   Dermatitis 08/20/2018   Glaucoma 01/31/2018   CKD (chronic kidney disease), stage III ([place]) 08/03/2017   Hyperlipidemia associated with type 2 diabetes mellitus ([place]) 06/02/2017   BPH (benign prostatic hyperplasia) 06/02/2017   History of prostate cancer 06/02/2017   Insomnia 06/02/2017   Hypertension associated with diabetes ([place]) 06/17/2015   T2DM (type 2 diabetes mellitus) ([place]) 06/17/2015   Past Surgical History:  Procedure Laterality Date   AMPUTATION Left 07/03/2015   Procedure: Left Foot 1st and 2nd Ray Amputation;  Surgeon: Newt Minion, MD;  Location: [facility];  Service: Orthopedics;  Laterality: Left;   BASCILIC VEIN TRANSPOSITION Left 07/14/2015   Procedure: BRACHIOBASILIC VEIN TRANSPOSITION  ;  Surgeon: Elam Dutch, MD;  Location: [facility] OR;  Service: Vascular;  Laterality: Left;   IRIDOTOMY / IRIDECTOMY     prostate seeds      Family History  Problem Relation Age of Onset   Diabetes Mother    Hypertension Mother    Stroke Mother    Diabetes Sister    Glaucoma Sister    Diabetes Brother    Glaucoma Brother    Amblyopia Neg Hx    Blindness Neg Hx    Cataracts Neg Hx    Macular degeneration Neg Hx    Retinal detachment Neg Hx    Strabismus Neg  Hx    Retinitis pigmentosa Neg Hx     Medications- Reconciled discharge and current medications in Epic.  Current Outpatient Medications  Medication Sig Dispense Refill   amLODipine (NORVASC) 10 MG tablet Take 1 tablet (10 mg total) by mouth daily. 90 tablet 0   aspirin EC 325 MG EC tablet Take 1 tablet (325 mg total) by mouth daily. 30 tablet 0   atorvastatin (LIPITOR) 10 MG tablet Take 1 tablet (10 mg total) by mouth daily. 90 tablet 1   atorvastatin (LIPITOR) 20 MG tablet Take 20 mg  by mouth daily.     Blood Glucose Monitoring Suppl (TRUE METRIX METER) w/Device KIT USE AS DIRECTED 1 kit 0   brimonidine (ALPHAGAN) 0.2 % ophthalmic solution Place 1 drop into both eyes in the morning and at bedtime.     carvedilol (COREG) 3.125 MG tablet Take 1 tablet (3.125 mg total) by mouth 2 (two) times daily with a meal. 180 tablet 3   clopidogrel (PLAVIX) 75 MG tablet Take 1 tablet (75 mg total) by mouth daily.     dorzolamide (TRUSOPT) 2 % ophthalmic solution SMARTSIG:In Eye(s)     dorzolamide-timolol (COSOPT) 22.3-6.8 MG/ML ophthalmic solution Place 1 drop into both eyes 2 (two) times daily.      feeding supplement (ENSURE ENLIVE / ENSURE PLUS) LIQD Take 237 mLs by mouth 2 (two) times daily between meals. 237 mL 12   fluticasone (FLONASE) 50 MCG/ACT nasal spray Place 2 sprays into both nostrils daily. 16 g 6   insulin aspart (NOVOLOG) 100 UNIT/ML injection Inject 0-9 Units into the skin every 4 (four) hours. 10 mL 11   insulin glargine (LANTUS SOLOSTAR) 100 UNIT/ML Solostar Pen Inject 5 Units into the skin daily. 45 mL 0   Insulin Pen Needle (PEN NEEDLES) 32G X 5 MM MISC 1 application by Does not apply route 4 (four) times daily as needed (to inject insulin). 100 each 11   latanoprost (XALATAN) 0.005 % ophthalmic solution INSTILL 1 DROP INTO BOTH EYES AT BEDTIME (Patient taking differently: Place 1 drop into both eyes at bedtime.) 3 mL 3   Multiple Vitamin (MULTIVITAMIN WITH MINERALS) TABS tablet Take 1 tablet by mouth daily. 90 tablet 0   pantoprazole (PROTONIX) 40 MG tablet Take 1 tablet (40 mg total) by mouth 2 (two) times daily. (Patient taking differently: Take 40 mg by mouth daily as needed (heartburn).) 180 tablet 3   polyethylene glycol (MIRALAX) 17 g packet Take 17 g by mouth daily as needed for mild constipation. 14 each 0   sertraline (ZOLOFT) 25 MG tablet Take 1 tablet (25 mg total) by mouth daily. 25 tablet 3   tamsulosin (FLOMAX) 0.4 MG CAPS capsule Take 1 capsule (0.4 mg  total) by mouth daily. (Patient taking differently: Take 0.4 mg by mouth at bedtime.) 90 capsule 3   traZODone (DESYREL) 150 MG tablet Take 1 tablet (150 mg total) by mouth at bedtime as needed for up to 5 days for sleep. 5 tablet 0   triamcinolone ointment (KENALOG) 0.5 % APPLY TOPICALLY 2 TIMES DAILY (Patient taking differently: Apply 1 application topically 2 (two) times daily as needed (rash).) 30 g 2   TRUE METRIX BLOOD GLUCOSE TEST test strip TEST BLOOD SUGAR FOUR TIMES DAILY AND AS NEEDED (Patient taking differently: 3 (three) times daily.) 450 strip 1   TRUEplus Lancets 33G MISC CHECK BLOOD SUGAR FOUR TIMES DAILY  AND AS NEEDED (Patient taking differently: 1 each by Other route 3 (three) times daily.)  500 each 3   Vitamin D3 (VITAMIN D) 25 MCG tablet Take 1,000 Units by mouth daily.     No current facility-administered medications for this visit.    Allergies-reviewed and updated No Known Allergies  Social History   Socioeconomic History   Marital status: Divorced    Spouse name: Not on file   Number of children: Not on file   Years of education: Not on file   Highest education level: Doctorate  Occupational History   Not on file  Tobacco Use   Smoking status: Never   Smokeless tobacco: Never  Vaping Use   Vaping Use: Never used  Substance and Sexual Activity   Alcohol use: Yes    Comment: once a week   Drug use: No   Sexual activity: Not on file  Other Topics Concern   Not on file  Social History Narrative   Left handed   Currently in rehab    Social Determinants of Health   Financial Resource Strain: Not on file  Food Insecurity: Not on file  Transportation Needs: Not on file  Physical Activity: Not on file  Stress: Not on file  Social Connections: Not on file        Objective:  Physical Exam: There were no vitals taken for this visit.  Gen: NAD, resting comfortably*** CV: RRR with no murmurs appreciated Pulm: NWOB, CTAB with no crackles, wheezes, or  rhonchi GI: Normal bowel sounds present. Soft, Nontender, Nondistended. MSK: No edema, cyanosis, or clubbing noted Skin: Warm, dry Neuro: Grossly normal, moves all extremities Psych: Normal affect and thought content  Summary/Review of work up during hospitalization: ***     Rc [place] M. Jerline Pain, MD 07/05/2021 10:37 AM
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
## 2021-07-07 DIAGNOSIS — E1159 Type 2 diabetes mellitus with other circulatory complications: Secondary | ICD-10-CM | POA: Diagnosis not present

## 2021-07-07 DIAGNOSIS — R55 Syncope and collapse: Secondary | ICD-10-CM | POA: Diagnosis not present

## 2021-07-07 DIAGNOSIS — I679 Cerebrovascular disease, unspecified: Secondary | ICD-10-CM | POA: Diagnosis not present

## 2021-07-07 DIAGNOSIS — I152 Hypertension secondary to endocrine disorders: Secondary | ICD-10-CM | POA: Diagnosis not present

## 2021-07-13 DIAGNOSIS — E1159 Type 2 diabetes mellitus with other circulatory complications: Secondary | ICD-10-CM | POA: Diagnosis not present

## 2021-07-13 DIAGNOSIS — I152 Hypertension secondary to endocrine disorders: Secondary | ICD-10-CM | POA: Diagnosis not present

## 2021-07-13 DIAGNOSIS — Z8673 Personal history of transient ischemic attack (TIA), and cerebral infarction without residual deficits: Secondary | ICD-10-CM | POA: Diagnosis not present

## 2021-07-13 DIAGNOSIS — F5101 Primary insomnia: Secondary | ICD-10-CM | POA: Diagnosis not present

## 2021-07-13 DIAGNOSIS — I6381 Other cerebral infarction due to occlusion or stenosis of small artery: Secondary | ICD-10-CM | POA: Diagnosis not present

## 2021-07-13 DIAGNOSIS — N1832 Chronic kidney disease, stage 3b: Secondary | ICD-10-CM | POA: Diagnosis not present

## 2021-07-13 DIAGNOSIS — R55 Syncope and collapse: Secondary | ICD-10-CM | POA: Diagnosis not present

## 2021-07-13 DIAGNOSIS — F0150 Vascular dementia without behavioral disturbance: Secondary | ICD-10-CM | POA: Diagnosis not present

## 2021-07-22 DIAGNOSIS — F0150 Vascular dementia without behavioral disturbance: Secondary | ICD-10-CM | POA: Diagnosis not present

## 2021-07-22 DIAGNOSIS — I679 Cerebrovascular disease, unspecified: Secondary | ICD-10-CM | POA: Diagnosis not present

## 2021-07-22 DIAGNOSIS — E118 Type 2 diabetes mellitus with unspecified complications: Secondary | ICD-10-CM | POA: Diagnosis not present

## 2021-07-22 DIAGNOSIS — R0989 Other specified symptoms and signs involving the circulatory and respiratory systems: Secondary | ICD-10-CM | POA: Diagnosis not present

## 2021-07-22 DIAGNOSIS — U071 COVID-19: Secondary | ICD-10-CM | POA: Diagnosis not present

## 2021-08-03 DIAGNOSIS — L97421 Non-pressure chronic ulcer of left heel and midfoot limited to breakdown of skin: Secondary | ICD-10-CM | POA: Diagnosis not present

## 2021-08-08 NOTE — Progress Notes (Deleted)
error 

## 2021-08-09 ENCOUNTER — Telehealth (INDEPENDENT_AMBULATORY_CARE_PROVIDER_SITE_OTHER): Payer: Medicare HMO | Admitting: Physician Assistant

## 2021-08-09 ENCOUNTER — Other Ambulatory Visit: Payer: Self-pay

## 2021-08-09 DIAGNOSIS — G309 Alzheimer's disease, unspecified: Secondary | ICD-10-CM

## 2021-08-09 DIAGNOSIS — F0280 Dementia in other diseases classified elsewhere without behavioral disturbance: Secondary | ICD-10-CM

## 2021-08-10 DIAGNOSIS — F0150 Vascular dementia without behavioral disturbance: Secondary | ICD-10-CM | POA: Diagnosis not present

## 2021-08-10 DIAGNOSIS — E1159 Type 2 diabetes mellitus with other circulatory complications: Secondary | ICD-10-CM | POA: Diagnosis not present

## 2021-08-10 DIAGNOSIS — L97421 Non-pressure chronic ulcer of left heel and midfoot limited to breakdown of skin: Secondary | ICD-10-CM | POA: Diagnosis not present

## 2021-08-10 DIAGNOSIS — I509 Heart failure, unspecified: Secondary | ICD-10-CM | POA: Diagnosis not present

## 2021-08-10 DIAGNOSIS — I152 Hypertension secondary to endocrine disorders: Secondary | ICD-10-CM | POA: Diagnosis not present

## 2021-08-10 DIAGNOSIS — Z8673 Personal history of transient ischemic attack (TIA), and cerebral infarction without residual deficits: Secondary | ICD-10-CM | POA: Diagnosis not present

## 2021-08-10 DIAGNOSIS — R55 Syncope and collapse: Secondary | ICD-10-CM | POA: Diagnosis not present

## 2021-08-12 DIAGNOSIS — Z89422 Acquired absence of other left toe(s): Secondary | ICD-10-CM | POA: Diagnosis not present

## 2021-08-12 DIAGNOSIS — E1140 Type 2 diabetes mellitus with diabetic neuropathy, unspecified: Secondary | ICD-10-CM | POA: Diagnosis not present

## 2021-08-12 DIAGNOSIS — L603 Nail dystrophy: Secondary | ICD-10-CM | POA: Diagnosis not present

## 2021-08-12 DIAGNOSIS — Z89412 Acquired absence of left great toe: Secondary | ICD-10-CM | POA: Diagnosis not present

## 2021-08-12 DIAGNOSIS — B351 Tinea unguium: Secondary | ICD-10-CM | POA: Diagnosis not present

## 2021-08-16 NOTE — Progress Notes (Signed)
error 

## 2021-08-17 DIAGNOSIS — W19XXXA Unspecified fall, initial encounter: Secondary | ICD-10-CM | POA: Diagnosis not present

## 2021-08-17 DIAGNOSIS — Z8673 Personal history of transient ischemic attack (TIA), and cerebral infarction without residual deficits: Secondary | ICD-10-CM | POA: Diagnosis not present

## 2021-08-17 DIAGNOSIS — I152 Hypertension secondary to endocrine disorders: Secondary | ICD-10-CM | POA: Diagnosis not present

## 2021-08-17 DIAGNOSIS — E1159 Type 2 diabetes mellitus with other circulatory complications: Secondary | ICD-10-CM | POA: Diagnosis not present

## 2021-08-20 DIAGNOSIS — N1832 Chronic kidney disease, stage 3b: Secondary | ICD-10-CM | POA: Diagnosis not present

## 2021-08-20 DIAGNOSIS — N400 Enlarged prostate without lower urinary tract symptoms: Secondary | ICD-10-CM | POA: Diagnosis not present

## 2021-08-20 DIAGNOSIS — E785 Hyperlipidemia, unspecified: Secondary | ICD-10-CM | POA: Diagnosis not present

## 2021-08-20 DIAGNOSIS — Z89429 Acquired absence of other toe(s), unspecified side: Secondary | ICD-10-CM | POA: Diagnosis not present

## 2021-08-20 DIAGNOSIS — I152 Hypertension secondary to endocrine disorders: Secondary | ICD-10-CM | POA: Diagnosis not present

## 2021-08-20 DIAGNOSIS — E1159 Type 2 diabetes mellitus with other circulatory complications: Secondary | ICD-10-CM | POA: Diagnosis not present

## 2021-08-20 DIAGNOSIS — I110 Hypertensive heart disease with heart failure: Secondary | ICD-10-CM | POA: Diagnosis not present

## 2021-08-20 DIAGNOSIS — Z794 Long term (current) use of insulin: Secondary | ICD-10-CM | POA: Diagnosis not present

## 2021-08-20 DIAGNOSIS — I679 Cerebrovascular disease, unspecified: Secondary | ICD-10-CM | POA: Diagnosis not present

## 2021-09-01 ENCOUNTER — Telehealth: Payer: Self-pay | Admitting: Family Medicine

## 2021-09-01 NOTE — Telephone Encounter (Signed)
Attempted to schedule AWV. Unable to LVM.  Will try at later time.  

## 2021-09-08 DIAGNOSIS — R262 Difficulty in walking, not elsewhere classified: Secondary | ICD-10-CM | POA: Diagnosis not present

## 2021-09-08 DIAGNOSIS — I639 Cerebral infarction, unspecified: Secondary | ICD-10-CM | POA: Diagnosis not present

## 2021-09-08 DIAGNOSIS — R41841 Cognitive communication deficit: Secondary | ICD-10-CM | POA: Diagnosis not present

## 2021-09-08 DIAGNOSIS — F0390 Unspecified dementia without behavioral disturbance: Secondary | ICD-10-CM | POA: Diagnosis not present

## 2021-09-08 DIAGNOSIS — M6281 Muscle weakness (generalized): Secondary | ICD-10-CM | POA: Diagnosis not present

## 2021-09-08 DIAGNOSIS — G9341 Metabolic encephalopathy: Secondary | ICD-10-CM | POA: Diagnosis not present

## 2021-09-08 DIAGNOSIS — R2689 Other abnormalities of gait and mobility: Secondary | ICD-10-CM | POA: Diagnosis not present

## 2021-09-09 DIAGNOSIS — F0390 Unspecified dementia without behavioral disturbance: Secondary | ICD-10-CM | POA: Diagnosis not present

## 2021-09-09 DIAGNOSIS — M6281 Muscle weakness (generalized): Secondary | ICD-10-CM | POA: Diagnosis not present

## 2021-09-09 DIAGNOSIS — I639 Cerebral infarction, unspecified: Secondary | ICD-10-CM | POA: Diagnosis not present

## 2021-09-09 DIAGNOSIS — R262 Difficulty in walking, not elsewhere classified: Secondary | ICD-10-CM | POA: Diagnosis not present

## 2021-09-09 DIAGNOSIS — R41841 Cognitive communication deficit: Secondary | ICD-10-CM | POA: Diagnosis not present

## 2021-09-09 DIAGNOSIS — G9341 Metabolic encephalopathy: Secondary | ICD-10-CM | POA: Diagnosis not present

## 2021-09-09 DIAGNOSIS — R2689 Other abnormalities of gait and mobility: Secondary | ICD-10-CM | POA: Diagnosis not present

## 2021-09-13 DIAGNOSIS — M6281 Muscle weakness (generalized): Secondary | ICD-10-CM | POA: Diagnosis not present

## 2021-09-13 DIAGNOSIS — R262 Difficulty in walking, not elsewhere classified: Secondary | ICD-10-CM | POA: Diagnosis not present

## 2021-09-13 DIAGNOSIS — F0390 Unspecified dementia without behavioral disturbance: Secondary | ICD-10-CM | POA: Diagnosis not present

## 2021-09-13 DIAGNOSIS — R2689 Other abnormalities of gait and mobility: Secondary | ICD-10-CM | POA: Diagnosis not present

## 2021-09-13 DIAGNOSIS — G9341 Metabolic encephalopathy: Secondary | ICD-10-CM | POA: Diagnosis not present

## 2021-09-13 DIAGNOSIS — R41841 Cognitive communication deficit: Secondary | ICD-10-CM | POA: Diagnosis not present

## 2021-09-13 DIAGNOSIS — I639 Cerebral infarction, unspecified: Secondary | ICD-10-CM | POA: Diagnosis not present

## 2021-09-14 DIAGNOSIS — F0390 Unspecified dementia without behavioral disturbance: Secondary | ICD-10-CM | POA: Diagnosis not present

## 2021-09-14 DIAGNOSIS — R262 Difficulty in walking, not elsewhere classified: Secondary | ICD-10-CM | POA: Diagnosis not present

## 2021-09-14 DIAGNOSIS — R2689 Other abnormalities of gait and mobility: Secondary | ICD-10-CM | POA: Diagnosis not present

## 2021-09-14 DIAGNOSIS — I639 Cerebral infarction, unspecified: Secondary | ICD-10-CM | POA: Diagnosis not present

## 2021-09-14 DIAGNOSIS — M6281 Muscle weakness (generalized): Secondary | ICD-10-CM | POA: Diagnosis not present

## 2021-09-14 DIAGNOSIS — G9341 Metabolic encephalopathy: Secondary | ICD-10-CM | POA: Diagnosis not present

## 2021-09-14 DIAGNOSIS — R41841 Cognitive communication deficit: Secondary | ICD-10-CM | POA: Diagnosis not present

## 2021-09-16 DIAGNOSIS — G9341 Metabolic encephalopathy: Secondary | ICD-10-CM | POA: Diagnosis not present

## 2021-09-16 DIAGNOSIS — I639 Cerebral infarction, unspecified: Secondary | ICD-10-CM | POA: Diagnosis not present

## 2021-09-16 DIAGNOSIS — F0390 Unspecified dementia without behavioral disturbance: Secondary | ICD-10-CM | POA: Diagnosis not present

## 2021-09-16 DIAGNOSIS — R2689 Other abnormalities of gait and mobility: Secondary | ICD-10-CM | POA: Diagnosis not present

## 2021-09-16 DIAGNOSIS — R262 Difficulty in walking, not elsewhere classified: Secondary | ICD-10-CM | POA: Diagnosis not present

## 2021-09-16 DIAGNOSIS — M6281 Muscle weakness (generalized): Secondary | ICD-10-CM | POA: Diagnosis not present

## 2021-09-16 DIAGNOSIS — R41841 Cognitive communication deficit: Secondary | ICD-10-CM | POA: Diagnosis not present

## 2021-09-19 DIAGNOSIS — M6281 Muscle weakness (generalized): Secondary | ICD-10-CM | POA: Diagnosis not present

## 2021-09-19 DIAGNOSIS — R262 Difficulty in walking, not elsewhere classified: Secondary | ICD-10-CM | POA: Diagnosis not present

## 2021-09-19 DIAGNOSIS — G9341 Metabolic encephalopathy: Secondary | ICD-10-CM | POA: Diagnosis not present

## 2021-09-19 DIAGNOSIS — R2689 Other abnormalities of gait and mobility: Secondary | ICD-10-CM | POA: Diagnosis not present

## 2021-09-20 DIAGNOSIS — M6281 Muscle weakness (generalized): Secondary | ICD-10-CM | POA: Diagnosis not present

## 2021-09-20 DIAGNOSIS — R262 Difficulty in walking, not elsewhere classified: Secondary | ICD-10-CM | POA: Diagnosis not present

## 2021-09-20 DIAGNOSIS — G9341 Metabolic encephalopathy: Secondary | ICD-10-CM | POA: Diagnosis not present

## 2021-09-20 DIAGNOSIS — R2689 Other abnormalities of gait and mobility: Secondary | ICD-10-CM | POA: Diagnosis not present

## 2021-09-21 DIAGNOSIS — R262 Difficulty in walking, not elsewhere classified: Secondary | ICD-10-CM | POA: Diagnosis not present

## 2021-09-21 DIAGNOSIS — M6281 Muscle weakness (generalized): Secondary | ICD-10-CM | POA: Diagnosis not present

## 2021-09-21 DIAGNOSIS — G9341 Metabolic encephalopathy: Secondary | ICD-10-CM | POA: Diagnosis not present

## 2021-09-21 DIAGNOSIS — L97522 Non-pressure chronic ulcer of other part of left foot with fat layer exposed: Secondary | ICD-10-CM | POA: Diagnosis not present

## 2021-09-21 DIAGNOSIS — R2689 Other abnormalities of gait and mobility: Secondary | ICD-10-CM | POA: Diagnosis not present

## 2021-09-22 DIAGNOSIS — M6281 Muscle weakness (generalized): Secondary | ICD-10-CM | POA: Diagnosis not present

## 2021-09-22 DIAGNOSIS — E11621 Type 2 diabetes mellitus with foot ulcer: Secondary | ICD-10-CM | POA: Diagnosis not present

## 2021-09-22 DIAGNOSIS — R2689 Other abnormalities of gait and mobility: Secondary | ICD-10-CM | POA: Diagnosis not present

## 2021-09-22 DIAGNOSIS — R262 Difficulty in walking, not elsewhere classified: Secondary | ICD-10-CM | POA: Diagnosis not present

## 2021-09-22 DIAGNOSIS — G9341 Metabolic encephalopathy: Secondary | ICD-10-CM | POA: Diagnosis not present

## 2021-09-23 DIAGNOSIS — M6281 Muscle weakness (generalized): Secondary | ICD-10-CM | POA: Diagnosis not present

## 2021-09-23 DIAGNOSIS — G9341 Metabolic encephalopathy: Secondary | ICD-10-CM | POA: Diagnosis not present

## 2021-09-23 DIAGNOSIS — R262 Difficulty in walking, not elsewhere classified: Secondary | ICD-10-CM | POA: Diagnosis not present

## 2021-09-23 DIAGNOSIS — R2689 Other abnormalities of gait and mobility: Secondary | ICD-10-CM | POA: Diagnosis not present

## 2021-09-24 DIAGNOSIS — M6281 Muscle weakness (generalized): Secondary | ICD-10-CM | POA: Diagnosis not present

## 2021-09-24 DIAGNOSIS — G9341 Metabolic encephalopathy: Secondary | ICD-10-CM | POA: Diagnosis not present

## 2021-09-24 DIAGNOSIS — R2689 Other abnormalities of gait and mobility: Secondary | ICD-10-CM | POA: Diagnosis not present

## 2021-09-24 DIAGNOSIS — R262 Difficulty in walking, not elsewhere classified: Secondary | ICD-10-CM | POA: Diagnosis not present

## 2021-09-26 DIAGNOSIS — R2689 Other abnormalities of gait and mobility: Secondary | ICD-10-CM | POA: Diagnosis not present

## 2021-09-26 DIAGNOSIS — G9341 Metabolic encephalopathy: Secondary | ICD-10-CM | POA: Diagnosis not present

## 2021-09-26 DIAGNOSIS — R262 Difficulty in walking, not elsewhere classified: Secondary | ICD-10-CM | POA: Diagnosis not present

## 2021-09-26 DIAGNOSIS — M6281 Muscle weakness (generalized): Secondary | ICD-10-CM | POA: Diagnosis not present

## 2021-09-27 DIAGNOSIS — M6281 Muscle weakness (generalized): Secondary | ICD-10-CM | POA: Diagnosis not present

## 2021-09-27 DIAGNOSIS — R262 Difficulty in walking, not elsewhere classified: Secondary | ICD-10-CM | POA: Diagnosis not present

## 2021-09-27 DIAGNOSIS — G9341 Metabolic encephalopathy: Secondary | ICD-10-CM | POA: Diagnosis not present

## 2021-09-27 DIAGNOSIS — R2689 Other abnormalities of gait and mobility: Secondary | ICD-10-CM | POA: Diagnosis not present

## 2021-09-28 DIAGNOSIS — M6281 Muscle weakness (generalized): Secondary | ICD-10-CM | POA: Diagnosis not present

## 2021-09-28 DIAGNOSIS — R2689 Other abnormalities of gait and mobility: Secondary | ICD-10-CM | POA: Diagnosis not present

## 2021-09-28 DIAGNOSIS — G9341 Metabolic encephalopathy: Secondary | ICD-10-CM | POA: Diagnosis not present

## 2021-09-28 DIAGNOSIS — R262 Difficulty in walking, not elsewhere classified: Secondary | ICD-10-CM | POA: Diagnosis not present

## 2021-09-30 DIAGNOSIS — R2689 Other abnormalities of gait and mobility: Secondary | ICD-10-CM | POA: Diagnosis not present

## 2021-09-30 DIAGNOSIS — G9341 Metabolic encephalopathy: Secondary | ICD-10-CM | POA: Diagnosis not present

## 2021-09-30 DIAGNOSIS — R262 Difficulty in walking, not elsewhere classified: Secondary | ICD-10-CM | POA: Diagnosis not present

## 2021-09-30 DIAGNOSIS — M6281 Muscle weakness (generalized): Secondary | ICD-10-CM | POA: Diagnosis not present

## 2021-10-01 DIAGNOSIS — M6281 Muscle weakness (generalized): Secondary | ICD-10-CM | POA: Diagnosis not present

## 2021-10-01 DIAGNOSIS — G9341 Metabolic encephalopathy: Secondary | ICD-10-CM | POA: Diagnosis not present

## 2021-10-01 DIAGNOSIS — R262 Difficulty in walking, not elsewhere classified: Secondary | ICD-10-CM | POA: Diagnosis not present

## 2021-10-01 DIAGNOSIS — R2689 Other abnormalities of gait and mobility: Secondary | ICD-10-CM | POA: Diagnosis not present

## 2021-10-03 DIAGNOSIS — R2689 Other abnormalities of gait and mobility: Secondary | ICD-10-CM | POA: Diagnosis not present

## 2021-10-03 DIAGNOSIS — R262 Difficulty in walking, not elsewhere classified: Secondary | ICD-10-CM | POA: Diagnosis not present

## 2021-10-03 DIAGNOSIS — M6281 Muscle weakness (generalized): Secondary | ICD-10-CM | POA: Diagnosis not present

## 2021-10-03 DIAGNOSIS — G9341 Metabolic encephalopathy: Secondary | ICD-10-CM | POA: Diagnosis not present

## 2021-10-04 DIAGNOSIS — R262 Difficulty in walking, not elsewhere classified: Secondary | ICD-10-CM | POA: Diagnosis not present

## 2021-10-04 DIAGNOSIS — G9341 Metabolic encephalopathy: Secondary | ICD-10-CM | POA: Diagnosis not present

## 2021-10-04 DIAGNOSIS — R2689 Other abnormalities of gait and mobility: Secondary | ICD-10-CM | POA: Diagnosis not present

## 2021-10-04 DIAGNOSIS — M6281 Muscle weakness (generalized): Secondary | ICD-10-CM | POA: Diagnosis not present

## 2021-10-04 DIAGNOSIS — I6381 Other cerebral infarction due to occlusion or stenosis of small artery: Secondary | ICD-10-CM | POA: Diagnosis not present

## 2021-10-04 DIAGNOSIS — F5101 Primary insomnia: Secondary | ICD-10-CM | POA: Diagnosis not present

## 2021-10-04 DIAGNOSIS — F0150 Vascular dementia without behavioral disturbance: Secondary | ICD-10-CM | POA: Diagnosis not present

## 2021-10-05 DIAGNOSIS — E1159 Type 2 diabetes mellitus with other circulatory complications: Secondary | ICD-10-CM | POA: Diagnosis not present

## 2021-10-05 DIAGNOSIS — M6281 Muscle weakness (generalized): Secondary | ICD-10-CM | POA: Diagnosis not present

## 2021-10-05 DIAGNOSIS — R262 Difficulty in walking, not elsewhere classified: Secondary | ICD-10-CM | POA: Diagnosis not present

## 2021-10-05 DIAGNOSIS — L97522 Non-pressure chronic ulcer of other part of left foot with fat layer exposed: Secondary | ICD-10-CM | POA: Diagnosis not present

## 2021-10-05 DIAGNOSIS — R2689 Other abnormalities of gait and mobility: Secondary | ICD-10-CM | POA: Diagnosis not present

## 2021-10-05 DIAGNOSIS — I152 Hypertension secondary to endocrine disorders: Secondary | ICD-10-CM | POA: Diagnosis not present

## 2021-10-05 DIAGNOSIS — G9341 Metabolic encephalopathy: Secondary | ICD-10-CM | POA: Diagnosis not present

## 2021-10-05 DIAGNOSIS — I130 Hypertensive heart and chronic kidney disease with heart failure and stage 1 through stage 4 chronic kidney disease, or unspecified chronic kidney disease: Secondary | ICD-10-CM | POA: Diagnosis not present

## 2021-10-05 DIAGNOSIS — F0150 Vascular dementia without behavioral disturbance: Secondary | ICD-10-CM | POA: Diagnosis not present

## 2021-10-06 DIAGNOSIS — M6281 Muscle weakness (generalized): Secondary | ICD-10-CM | POA: Diagnosis not present

## 2021-10-06 DIAGNOSIS — R262 Difficulty in walking, not elsewhere classified: Secondary | ICD-10-CM | POA: Diagnosis not present

## 2021-10-06 DIAGNOSIS — G9341 Metabolic encephalopathy: Secondary | ICD-10-CM | POA: Diagnosis not present

## 2021-10-06 DIAGNOSIS — R2689 Other abnormalities of gait and mobility: Secondary | ICD-10-CM | POA: Diagnosis not present

## 2021-10-07 DIAGNOSIS — R2689 Other abnormalities of gait and mobility: Secondary | ICD-10-CM | POA: Diagnosis not present

## 2021-10-07 DIAGNOSIS — G9341 Metabolic encephalopathy: Secondary | ICD-10-CM | POA: Diagnosis not present

## 2021-10-07 DIAGNOSIS — M6281 Muscle weakness (generalized): Secondary | ICD-10-CM | POA: Diagnosis not present

## 2021-10-07 DIAGNOSIS — R262 Difficulty in walking, not elsewhere classified: Secondary | ICD-10-CM | POA: Diagnosis not present

## 2021-10-12 DIAGNOSIS — L97522 Non-pressure chronic ulcer of other part of left foot with fat layer exposed: Secondary | ICD-10-CM | POA: Diagnosis not present

## 2021-10-12 DIAGNOSIS — L97422 Non-pressure chronic ulcer of left heel and midfoot with fat layer exposed: Secondary | ICD-10-CM | POA: Diagnosis not present

## 2021-10-13 DIAGNOSIS — S91302D Unspecified open wound, left foot, subsequent encounter: Secondary | ICD-10-CM | POA: Diagnosis not present

## 2021-10-13 DIAGNOSIS — E11621 Type 2 diabetes mellitus with foot ulcer: Secondary | ICD-10-CM | POA: Diagnosis not present

## 2021-10-14 DIAGNOSIS — R2689 Other abnormalities of gait and mobility: Secondary | ICD-10-CM | POA: Diagnosis not present

## 2021-10-14 DIAGNOSIS — M6281 Muscle weakness (generalized): Secondary | ICD-10-CM | POA: Diagnosis not present

## 2021-10-14 DIAGNOSIS — R262 Difficulty in walking, not elsewhere classified: Secondary | ICD-10-CM | POA: Diagnosis not present

## 2021-10-14 DIAGNOSIS — G9341 Metabolic encephalopathy: Secondary | ICD-10-CM | POA: Diagnosis not present

## 2021-10-15 DIAGNOSIS — I69359 Hemiplegia and hemiparesis following cerebral infarction affecting unspecified side: Secondary | ICD-10-CM | POA: Diagnosis not present

## 2021-10-15 DIAGNOSIS — R262 Difficulty in walking, not elsewhere classified: Secondary | ICD-10-CM | POA: Diagnosis not present

## 2021-10-15 DIAGNOSIS — G9341 Metabolic encephalopathy: Secondary | ICD-10-CM | POA: Diagnosis not present

## 2021-10-15 DIAGNOSIS — R2689 Other abnormalities of gait and mobility: Secondary | ICD-10-CM | POA: Diagnosis not present

## 2021-10-15 DIAGNOSIS — M6281 Muscle weakness (generalized): Secondary | ICD-10-CM | POA: Diagnosis not present

## 2021-10-15 DIAGNOSIS — W19XXXA Unspecified fall, initial encounter: Secondary | ICD-10-CM | POA: Diagnosis not present

## 2021-10-15 DIAGNOSIS — M545 Low back pain, unspecified: Secondary | ICD-10-CM | POA: Diagnosis not present

## 2021-10-16 DIAGNOSIS — M545 Low back pain, unspecified: Secondary | ICD-10-CM | POA: Diagnosis not present

## 2021-10-16 DIAGNOSIS — M546 Pain in thoracic spine: Secondary | ICD-10-CM | POA: Diagnosis not present

## 2021-10-17 DIAGNOSIS — G9341 Metabolic encephalopathy: Secondary | ICD-10-CM | POA: Diagnosis not present

## 2021-10-17 DIAGNOSIS — M6281 Muscle weakness (generalized): Secondary | ICD-10-CM | POA: Diagnosis not present

## 2021-10-17 DIAGNOSIS — R262 Difficulty in walking, not elsewhere classified: Secondary | ICD-10-CM | POA: Diagnosis not present

## 2021-10-17 DIAGNOSIS — R2689 Other abnormalities of gait and mobility: Secondary | ICD-10-CM | POA: Diagnosis not present

## 2021-10-18 DIAGNOSIS — M545 Low back pain, unspecified: Secondary | ICD-10-CM | POA: Diagnosis not present

## 2021-10-18 DIAGNOSIS — R2689 Other abnormalities of gait and mobility: Secondary | ICD-10-CM | POA: Diagnosis not present

## 2021-10-18 DIAGNOSIS — M6281 Muscle weakness (generalized): Secondary | ICD-10-CM | POA: Diagnosis not present

## 2021-10-18 DIAGNOSIS — R262 Difficulty in walking, not elsewhere classified: Secondary | ICD-10-CM | POA: Diagnosis not present

## 2021-10-18 DIAGNOSIS — G9341 Metabolic encephalopathy: Secondary | ICD-10-CM | POA: Diagnosis not present

## 2021-10-19 DIAGNOSIS — M6281 Muscle weakness (generalized): Secondary | ICD-10-CM | POA: Diagnosis not present

## 2021-10-19 DIAGNOSIS — L97522 Non-pressure chronic ulcer of other part of left foot with fat layer exposed: Secondary | ICD-10-CM | POA: Diagnosis not present

## 2021-10-19 DIAGNOSIS — R2689 Other abnormalities of gait and mobility: Secondary | ICD-10-CM | POA: Diagnosis not present

## 2021-10-19 DIAGNOSIS — R262 Difficulty in walking, not elsewhere classified: Secondary | ICD-10-CM | POA: Diagnosis not present

## 2021-10-19 DIAGNOSIS — G9341 Metabolic encephalopathy: Secondary | ICD-10-CM | POA: Diagnosis not present

## 2021-10-19 DIAGNOSIS — L97422 Non-pressure chronic ulcer of left heel and midfoot with fat layer exposed: Secondary | ICD-10-CM | POA: Diagnosis not present

## 2021-10-20 DIAGNOSIS — R2689 Other abnormalities of gait and mobility: Secondary | ICD-10-CM | POA: Diagnosis not present

## 2021-10-20 DIAGNOSIS — M6281 Muscle weakness (generalized): Secondary | ICD-10-CM | POA: Diagnosis not present

## 2021-10-20 DIAGNOSIS — R262 Difficulty in walking, not elsewhere classified: Secondary | ICD-10-CM | POA: Diagnosis not present

## 2021-10-20 DIAGNOSIS — G9341 Metabolic encephalopathy: Secondary | ICD-10-CM | POA: Diagnosis not present

## 2021-10-21 DIAGNOSIS — R2689 Other abnormalities of gait and mobility: Secondary | ICD-10-CM | POA: Diagnosis not present

## 2021-10-21 DIAGNOSIS — R262 Difficulty in walking, not elsewhere classified: Secondary | ICD-10-CM | POA: Diagnosis not present

## 2021-10-21 DIAGNOSIS — G9341 Metabolic encephalopathy: Secondary | ICD-10-CM | POA: Diagnosis not present

## 2021-10-21 DIAGNOSIS — M6281 Muscle weakness (generalized): Secondary | ICD-10-CM | POA: Diagnosis not present

## 2021-10-22 DIAGNOSIS — M6281 Muscle weakness (generalized): Secondary | ICD-10-CM | POA: Diagnosis not present

## 2021-10-22 DIAGNOSIS — R262 Difficulty in walking, not elsewhere classified: Secondary | ICD-10-CM | POA: Diagnosis not present

## 2021-10-22 DIAGNOSIS — G9341 Metabolic encephalopathy: Secondary | ICD-10-CM | POA: Diagnosis not present

## 2021-10-22 DIAGNOSIS — R2689 Other abnormalities of gait and mobility: Secondary | ICD-10-CM | POA: Diagnosis not present

## 2021-10-24 DIAGNOSIS — R2689 Other abnormalities of gait and mobility: Secondary | ICD-10-CM | POA: Diagnosis not present

## 2021-10-24 DIAGNOSIS — G9341 Metabolic encephalopathy: Secondary | ICD-10-CM | POA: Diagnosis not present

## 2021-10-24 DIAGNOSIS — R262 Difficulty in walking, not elsewhere classified: Secondary | ICD-10-CM | POA: Diagnosis not present

## 2021-10-24 DIAGNOSIS — M6281 Muscle weakness (generalized): Secondary | ICD-10-CM | POA: Diagnosis not present

## 2021-10-25 DIAGNOSIS — G9341 Metabolic encephalopathy: Secondary | ICD-10-CM | POA: Diagnosis not present

## 2021-10-25 DIAGNOSIS — R2689 Other abnormalities of gait and mobility: Secondary | ICD-10-CM | POA: Diagnosis not present

## 2021-10-25 DIAGNOSIS — M6281 Muscle weakness (generalized): Secondary | ICD-10-CM | POA: Diagnosis not present

## 2021-10-25 DIAGNOSIS — R262 Difficulty in walking, not elsewhere classified: Secondary | ICD-10-CM | POA: Diagnosis not present

## 2021-10-26 DIAGNOSIS — M6281 Muscle weakness (generalized): Secondary | ICD-10-CM | POA: Diagnosis not present

## 2021-10-26 DIAGNOSIS — R2689 Other abnormalities of gait and mobility: Secondary | ICD-10-CM | POA: Diagnosis not present

## 2021-10-26 DIAGNOSIS — L97422 Non-pressure chronic ulcer of left heel and midfoot with fat layer exposed: Secondary | ICD-10-CM | POA: Diagnosis not present

## 2021-10-26 DIAGNOSIS — R262 Difficulty in walking, not elsewhere classified: Secondary | ICD-10-CM | POA: Diagnosis not present

## 2021-10-26 DIAGNOSIS — G9341 Metabolic encephalopathy: Secondary | ICD-10-CM | POA: Diagnosis not present

## 2021-10-26 DIAGNOSIS — L97522 Non-pressure chronic ulcer of other part of left foot with fat layer exposed: Secondary | ICD-10-CM | POA: Diagnosis not present

## 2021-11-02 DIAGNOSIS — L97522 Non-pressure chronic ulcer of other part of left foot with fat layer exposed: Secondary | ICD-10-CM | POA: Diagnosis not present

## 2021-11-02 DIAGNOSIS — L97422 Non-pressure chronic ulcer of left heel and midfoot with fat layer exposed: Secondary | ICD-10-CM | POA: Diagnosis not present

## 2021-11-09 DIAGNOSIS — L97422 Non-pressure chronic ulcer of left heel and midfoot with fat layer exposed: Secondary | ICD-10-CM | POA: Diagnosis not present

## 2021-11-09 DIAGNOSIS — L97522 Non-pressure chronic ulcer of other part of left foot with fat layer exposed: Secondary | ICD-10-CM | POA: Diagnosis not present

## 2021-11-09 DIAGNOSIS — L97509 Non-pressure chronic ulcer of other part of unspecified foot with unspecified severity: Secondary | ICD-10-CM | POA: Diagnosis not present

## 2021-11-11 DIAGNOSIS — S91105D Unspecified open wound of left lesser toe(s) without damage to nail, subsequent encounter: Secondary | ICD-10-CM | POA: Diagnosis not present

## 2021-11-11 DIAGNOSIS — S91302D Unspecified open wound, left foot, subsequent encounter: Secondary | ICD-10-CM | POA: Diagnosis not present

## 2021-11-16 DIAGNOSIS — L97422 Non-pressure chronic ulcer of left heel and midfoot with fat layer exposed: Secondary | ICD-10-CM | POA: Diagnosis not present

## 2021-11-16 DIAGNOSIS — Z794 Long term (current) use of insulin: Secondary | ICD-10-CM | POA: Diagnosis not present

## 2021-11-16 DIAGNOSIS — E11621 Type 2 diabetes mellitus with foot ulcer: Secondary | ICD-10-CM | POA: Diagnosis not present

## 2021-11-16 DIAGNOSIS — L97522 Non-pressure chronic ulcer of other part of left foot with fat layer exposed: Secondary | ICD-10-CM | POA: Diagnosis not present

## 2021-11-16 DIAGNOSIS — L97501 Non-pressure chronic ulcer of other part of unspecified foot limited to breakdown of skin: Secondary | ICD-10-CM | POA: Diagnosis not present

## 2021-11-23 DIAGNOSIS — L97522 Non-pressure chronic ulcer of other part of left foot with fat layer exposed: Secondary | ICD-10-CM | POA: Diagnosis not present

## 2021-11-23 DIAGNOSIS — L97422 Non-pressure chronic ulcer of left heel and midfoot with fat layer exposed: Secondary | ICD-10-CM | POA: Diagnosis not present

## 2021-11-23 DIAGNOSIS — L603 Nail dystrophy: Secondary | ICD-10-CM | POA: Diagnosis not present

## 2021-11-23 DIAGNOSIS — Z89412 Acquired absence of left great toe: Secondary | ICD-10-CM | POA: Diagnosis not present

## 2021-11-29 DIAGNOSIS — F5101 Primary insomnia: Secondary | ICD-10-CM | POA: Diagnosis not present

## 2021-11-29 DIAGNOSIS — F0150 Vascular dementia without behavioral disturbance: Secondary | ICD-10-CM | POA: Diagnosis not present

## 2021-11-29 DIAGNOSIS — I6381 Other cerebral infarction due to occlusion or stenosis of small artery: Secondary | ICD-10-CM | POA: Diagnosis not present

## 2021-11-30 DIAGNOSIS — N1832 Chronic kidney disease, stage 3b: Secondary | ICD-10-CM | POA: Diagnosis not present

## 2021-11-30 DIAGNOSIS — I110 Hypertensive heart disease with heart failure: Secondary | ICD-10-CM | POA: Diagnosis not present

## 2021-11-30 DIAGNOSIS — I679 Cerebrovascular disease, unspecified: Secondary | ICD-10-CM | POA: Diagnosis not present

## 2021-11-30 DIAGNOSIS — I152 Hypertension secondary to endocrine disorders: Secondary | ICD-10-CM | POA: Diagnosis not present

## 2021-11-30 DIAGNOSIS — E118 Type 2 diabetes mellitus with unspecified complications: Secondary | ICD-10-CM | POA: Diagnosis not present

## 2021-11-30 DIAGNOSIS — N400 Enlarged prostate without lower urinary tract symptoms: Secondary | ICD-10-CM | POA: Diagnosis not present

## 2021-11-30 DIAGNOSIS — Z794 Long term (current) use of insulin: Secondary | ICD-10-CM | POA: Diagnosis not present

## 2021-11-30 DIAGNOSIS — L97522 Non-pressure chronic ulcer of other part of left foot with fat layer exposed: Secondary | ICD-10-CM | POA: Diagnosis not present

## 2021-11-30 DIAGNOSIS — E785 Hyperlipidemia, unspecified: Secondary | ICD-10-CM | POA: Diagnosis not present

## 2021-11-30 DIAGNOSIS — Z89429 Acquired absence of other toe(s), unspecified side: Secondary | ICD-10-CM | POA: Diagnosis not present

## 2021-12-07 DIAGNOSIS — M86172 Other acute osteomyelitis, left ankle and foot: Secondary | ICD-10-CM | POA: Diagnosis not present

## 2021-12-07 DIAGNOSIS — L97522 Non-pressure chronic ulcer of other part of left foot with fat layer exposed: Secondary | ICD-10-CM | POA: Diagnosis not present

## 2021-12-08 DIAGNOSIS — L089 Local infection of the skin and subcutaneous tissue, unspecified: Secondary | ICD-10-CM | POA: Diagnosis not present

## 2021-12-10 DIAGNOSIS — S91105D Unspecified open wound of left lesser toe(s) without damage to nail, subsequent encounter: Secondary | ICD-10-CM | POA: Diagnosis not present

## 2021-12-13 DIAGNOSIS — R41841 Cognitive communication deficit: Secondary | ICD-10-CM | POA: Diagnosis not present

## 2021-12-13 DIAGNOSIS — R278 Other lack of coordination: Secondary | ICD-10-CM | POA: Diagnosis not present

## 2021-12-13 DIAGNOSIS — M6281 Muscle weakness (generalized): Secondary | ICD-10-CM | POA: Diagnosis not present

## 2021-12-14 DIAGNOSIS — R41841 Cognitive communication deficit: Secondary | ICD-10-CM | POA: Diagnosis not present

## 2021-12-14 DIAGNOSIS — L97522 Non-pressure chronic ulcer of other part of left foot with fat layer exposed: Secondary | ICD-10-CM | POA: Diagnosis not present

## 2021-12-14 DIAGNOSIS — R278 Other lack of coordination: Secondary | ICD-10-CM | POA: Diagnosis not present

## 2021-12-14 DIAGNOSIS — M6281 Muscle weakness (generalized): Secondary | ICD-10-CM | POA: Diagnosis not present

## 2021-12-15 DIAGNOSIS — R41841 Cognitive communication deficit: Secondary | ICD-10-CM | POA: Diagnosis not present

## 2021-12-15 DIAGNOSIS — R278 Other lack of coordination: Secondary | ICD-10-CM | POA: Diagnosis not present

## 2021-12-15 DIAGNOSIS — E7800 Pure hypercholesterolemia, unspecified: Secondary | ICD-10-CM | POA: Diagnosis not present

## 2021-12-15 DIAGNOSIS — M6281 Muscle weakness (generalized): Secondary | ICD-10-CM | POA: Diagnosis not present

## 2021-12-15 DIAGNOSIS — R51 Headache with orthostatic component, not elsewhere classified: Secondary | ICD-10-CM | POA: Diagnosis not present

## 2021-12-16 DIAGNOSIS — R41841 Cognitive communication deficit: Secondary | ICD-10-CM | POA: Diagnosis not present

## 2021-12-16 DIAGNOSIS — R278 Other lack of coordination: Secondary | ICD-10-CM | POA: Diagnosis not present

## 2021-12-16 DIAGNOSIS — M6281 Muscle weakness (generalized): Secondary | ICD-10-CM | POA: Diagnosis not present

## 2021-12-17 DIAGNOSIS — R41841 Cognitive communication deficit: Secondary | ICD-10-CM | POA: Diagnosis not present

## 2021-12-17 DIAGNOSIS — M6281 Muscle weakness (generalized): Secondary | ICD-10-CM | POA: Diagnosis not present

## 2021-12-17 DIAGNOSIS — R278 Other lack of coordination: Secondary | ICD-10-CM | POA: Diagnosis not present

## 2021-12-19 DIAGNOSIS — R41841 Cognitive communication deficit: Secondary | ICD-10-CM | POA: Diagnosis not present

## 2021-12-19 DIAGNOSIS — R278 Other lack of coordination: Secondary | ICD-10-CM | POA: Diagnosis not present

## 2021-12-19 DIAGNOSIS — M6281 Muscle weakness (generalized): Secondary | ICD-10-CM | POA: Diagnosis not present

## 2021-12-20 ENCOUNTER — Other Ambulatory Visit: Payer: Self-pay

## 2021-12-20 ENCOUNTER — Encounter: Payer: Self-pay | Admitting: Family

## 2021-12-20 ENCOUNTER — Ambulatory Visit (INDEPENDENT_AMBULATORY_CARE_PROVIDER_SITE_OTHER): Payer: Medicare HMO | Admitting: Family

## 2021-12-20 VITALS — BP 119/75 | HR 62 | Temp 97.6°F | Resp 16 | Ht 73.0 in | Wt 180.0 lb

## 2021-12-20 DIAGNOSIS — M86172 Other acute osteomyelitis, left ankle and foot: Secondary | ICD-10-CM | POA: Diagnosis not present

## 2021-12-20 DIAGNOSIS — N1832 Chronic kidney disease, stage 3b: Secondary | ICD-10-CM | POA: Diagnosis not present

## 2021-12-20 DIAGNOSIS — E1159 Type 2 diabetes mellitus with other circulatory complications: Secondary | ICD-10-CM

## 2021-12-20 NOTE — Assessment & Plan Note (Signed)
David Ford has CKD Stage 3b with most recent lab work showing GFR of 35. CrCl of 35.37. Will recheck CMET today. Levofloxacin renally dosed down to 750 mg q 48 hours. May need central line depending upon treatment given CKD Stage 3b. Continue to monitor.

## 2021-12-20 NOTE — Assessment & Plan Note (Signed)
David Ford is a 81 y/o gentleman with diabetes complicated by neuropathy and diabetic foot ulcer with new findings of osteomyelitis on pathology from bone culture. No obvious purulent drainage on exam although I am concerned there may be deeper infection or possible abscess and to check this will need MRI of left foot. Unlikely that we will grow any cultures and will continue with renally dosed levofloxacin and doxycycline. Check renal function and inflammatory markers. Continue wound care per facility. Await MRI results to ensure we have source control. Will need prolonged course of antibiotics either way. Follow up and additional treatment pending MRI results.

## 2021-12-20 NOTE — Progress Notes (Signed)
Subjective:    Patient ID: David Ford, male    DOB: 1941/05/11, 81 y.o.   MRN: [id number]  Chief Complaint  Patient presents with   Osteomyelitis    New Patient (Initial Visit)    HPI:  David Ford is a 81 y.o. male with previous medical history of hypertension, Type 2 diabetes complicated by diabetic foot ulcers, benign prostate hypertrophy, chronic kidney disease stage 3, dementia, and CVA presenting for evaluation from [facility] for acute osteomyelitis.   Mr. Guglielmo has diabetic ulcers of the left 5th metatarsal head and left medial heel which is currently being evaluated by wound care. Nursing notes reviewed from 12/07/21 the left heel wound had resolved with skin remaining pink in color and intact. The 5th metatarsal head failed to make progress or improve and bone was palpable at the visit with provider being able to remove a piece of bone which was sent for pathology and found to have acute osteomyelitis. He was started on doxycycline and levofloxacin with referral to ID for additional antibiotic recommendations.   Mr. Glassburn has had a lesion for about 1 month now which started as a blister and has progressively worsened. Has diabetic neuropathy and decreased/no sensation in his foot for about 3 years. Previous 1st/2nd ray amputation secondary to infection. No recent imaging. No systemic symptoms of fevers or chills. Dressing is changed daily. Has not seen Podiatry or Orthopedics. New to the [place] area in the last 2 months. Currently a resident at [facility] secondary to CVA. Records from [place] reviewed with continued elevated blood sugars. Does not recall his last A1c. No current pain.    No Known Allergies    Outpatient Medications Prior to Visit  Medication Sig Dispense Refill   amLODipine (NORVASC) 10 MG tablet Take 1 tablet (10 mg total) by mouth daily. 90 tablet 0   aspirin EC 325 MG EC tablet Take 1 tablet (325 mg total) by mouth  daily. 30 tablet 0   atorvastatin (LIPITOR) 20 MG tablet Take 20 mg by mouth daily.     Blood Glucose Monitoring Suppl (TRUE METRIX METER) w/Device KIT USE AS DIRECTED 1 kit 0   brimonidine (ALPHAGAN) 0.2 % ophthalmic solution Place 1 drop into both eyes in the morning and at bedtime.     carvedilol (COREG) 3.125 MG tablet Take 1 tablet (3.125 mg total) by mouth 2 (two) times daily with a meal. 180 tablet 3   dorzolamide (TRUSOPT) 2 % ophthalmic solution SMARTSIG:In Eye(s)     dorzolamide-timolol (COSOPT) 22.3-6.8 MG/ML ophthalmic solution Place 1 drop into both eyes 2 (two) times daily.      feeding supplement (ENSURE ENLIVE / ENSURE PLUS) LIQD Take 237 mLs by mouth 2 (two) times daily between meals. 237 mL 12   insulin aspart (NOVOLOG) 100 UNIT/ML injection Inject 0-9 Units into the skin every 4 (four) hours. 10 mL 11   insulin glargine (LANTUS SOLOSTAR) 100 UNIT/ML Solostar Pen Inject 5 Units into the skin daily. 45 mL 0   Insulin Pen Needle (PEN NEEDLES) 32G X 5 MM MISC 1 application by Does not apply route 4 (four) times daily as needed (to inject insulin). 100 each 11   latanoprost (XALATAN) 0.005 % ophthalmic solution INSTILL 1 DROP INTO BOTH EYES AT BEDTIME (Patient taking differently: Place 1 drop into both eyes at bedtime.) 3 mL 3   polyethylene glycol (MIRALAX) 17 g packet Take 17 g by mouth daily as needed for mild constipation. 14 each  0   tamsulosin (FLOMAX) 0.4 MG CAPS capsule Take 1 capsule (0.4 mg total) by mouth daily. (Patient taking differently: Take 0.4 mg by mouth at bedtime.) 90 capsule 3   traZODone (DESYREL) 150 MG tablet Take 1 tablet (150 mg total) by mouth at bedtime as needed for up to 5 days for sleep. 5 tablet 0   triamcinolone ointment (KENALOG) 0.5 % APPLY TOPICALLY 2 TIMES DAILY (Patient taking differently: Apply 1 application  topically 2 (two) times daily as needed (rash).) 30 g 2   TRUE METRIX BLOOD GLUCOSE TEST test strip TEST BLOOD SUGAR FOUR TIMES DAILY AND AS  NEEDED (Patient taking differently: 3 (three) times daily.) 450 strip 1   TRUEplus Lancets 33G MISC CHECK BLOOD SUGAR FOUR TIMES DAILY  AND AS NEEDED (Patient taking differently: 1 each by Other route 3 (three) times daily.) 500 each 3   Vitamin D3 (VITAMIN D) 25 MCG tablet Take 1,000 Units by mouth daily.     atorvastatin (LIPITOR) 10 MG tablet Take 1 tablet (10 mg total) by mouth daily. (Patient not taking: Reported on 12/20/2021) 90 tablet 1   doxycycline (VIBRAMYCIN) 100 MG capsule Take 100 mg by mouth 2 (two) times daily.     fluticasone (FLONASE) 50 MCG/ACT nasal spray Place 2 sprays into both nostrils daily. (Patient not taking: Reported on 12/20/2021) 16 g 6   levofloxacin (LEVAQUIN) 750 MG tablet Take 750 mg by mouth daily.     pantoprazole (PROTONIX) 40 MG tablet Take 1 tablet (40 mg total) by mouth 2 (two) times daily. (Patient not taking: Reported on 12/20/2021) 180 tablet 3   sertraline (ZOLOFT) 25 MG tablet Take 1 tablet (25 mg total) by mouth daily. (Patient not taking: Reported on 12/20/2021) 25 tablet 3   No facility-administered medications prior to visit.     Past Medical History:  Diagnosis Date   AKI (acute kidney injury) ([place]) 06/18/2015   Cancer ([place])    hx of prostate cancer   Chronic kidney disease    Diabetes mellitus without complication ([place])    DKA (diabetic ketoacidoses) 06/18/2015   Glaucoma    Hyperlipidemia    Hypertension      Past Surgical History:  Procedure Laterality Date   AMPUTATION Left 07/03/2015   Procedure: Left Foot 1st and 2nd Ray Amputation;  Surgeon: Newt Minion, MD;  Location: [place];  Service: Orthopedics;  Laterality: Left;   BASCILIC VEIN TRANSPOSITION Left 07/14/2015   Procedure: BRACHIOBASILIC VEIN TRANSPOSITION  ;  Surgeon: Elam Dutch, MD;  Location: [facility] OR;  Service: Vascular;  Laterality: Left;   IRIDOTOMY / IRIDECTOMY     prostate seeds         Review of Systems  Constitutional:  Negative for chills, diaphoresis, fatigue  and fever.  Respiratory:  Negative for cough, chest tightness, shortness of breath and wheezing.   Cardiovascular:  Negative for chest pain.  Gastrointestinal:  Negative for abdominal pain, diarrhea, nausea and vomiting.  Skin:  Positive for wound.      Objective:    BP 119/75   Pulse 62   Temp 97.6 F (36.4 C)   Resp 16   Ht _0  (1.854 m)   Wt 180 lb (81.6 kg)   SpO2 100%   BMI 23.75 kg/m  Nursing note and vital signs reviewed.  Physical Exam Constitutional:      General: He is not in acute distress.    Appearance: He is well-developed.  Cardiovascular:  Rate and Rhythm: Normal rate and regular rhythm.     Heart sounds: Normal heart sounds.  Pulmonary:     Effort: Pulmonary effort is normal.     Breath sounds: Normal breath sounds.  Skin:    General: Skin is warm and dry.     Comments: Left foot with wound on lateral aspect of 5th metatarsal which is packed with gauze. There is bloody drainage. Foot is mildly warm and edematous compared to contralateral side. Pulses are present.   Neurological:     Mental Status: He is alert.  Psychiatric:        Mood and Affect: Mood normal.         12/20/2021    2:00 PM 01/01/2021    9:36 AM 10/01/2020   11:37 AM 08/12/2019   10:18 AM 08/09/2019    9:59 AM  Depression screen PHQ 2/9  Decreased Interest 0 0 0 0 0  Down, Depressed, Hopeless 0 0 0 0 0  PHQ - 2 Score 0 0 0 0 0       Assessment & Plan:    Patient Active Problem List   Diagnosis Date Noted   Acute osteomyelitis of metatarsal bone of left foot ([place]) 12/20/2021   Seizure-like activity ([place]) 94/17/4081   Acute metabolic encephalopathy 44/81/8563   CVA (cerebral vascular accident) ([place]) 06/24/2021   Abdominal distension 06/24/2021   Chronic diastolic CHF (congestive heart failure) ([place]) 06/24/2021   Acute-on-chronic kidney injury ([place]) 06/16/2021   Anemia 06/16/2021   Hypoglycemia 06/15/2021   Dementia without behavioral disturbance ([place]) 06/08/2021    Esophagitis 01/06/2020   Dermatitis 08/20/2018   Glaucoma 01/31/2018   CKD (chronic kidney disease), stage III ([place]) 08/03/2017   Hyperlipidemia associated with type 2 diabetes mellitus ([place]) 06/02/2017   BPH (benign prostatic hyperplasia) 06/02/2017   History of prostate cancer 06/02/2017   Insomnia 06/02/2017   Hypertension associated with diabetes ([place]) 06/17/2015   T2DM (type 2 diabetes mellitus) ([place]) 06/17/2015     Problem List Items Addressed This Visit       Endocrine   T2DM (type 2 diabetes mellitus) ([place])    Blood [facility] from [facility]. Discussed importance of blood sugar control as this can result in complicated healing and increase risk of infection. Encouraged increased protein intake to help with healing as well. Continue diabetes management per Internal Medicine.         Musculoskeletal and Integument   Acute osteomyelitis of metatarsal bone of left foot (HCC) - Primary    Mr. Neely is a 81 y/o gentleman with diabetes complicated by neuropathy and diabetic foot ulcer with new findings of osteomyelitis on pathology from bone culture. No obvious purulent drainage on exam although I am concerned there may be deeper infection or possible abscess and to check this will need MRI of left foot. Unlikely that we will grow any cultures and will continue with renally dosed levofloxacin and doxycycline. Check renal function and inflammatory markers. Continue wound care per facility. Await MRI results to ensure we have source control. Will need prolonged course of antibiotics either way. Follow up and additional treatment pending MRI results.       Relevant Orders   Comprehensive metabolic panel   Sedimentation rate   C-reactive protein   MR FOOT LEFT W WO CONTRAST     Genitourinary   CKD (chronic kidney disease), stage III (HCC) (Chronic)    Mr. Mittelstaedt has CKD Stage 3b with most recent lab work  showing GFR of 35. CrCl of 35.37. Will recheck CMET today.  Levofloxacin renally dosed down to 750 mg q 48 hours. May need central line depending upon treatment given CKD Stage 3b. Continue to monitor.         I am having Loanne Drilling maintain his Pen Needles, latanoprost, brimonidine, fluticasone, dorzolamide-timolol, TRUEplus Lancets 33G, True Metrix Meter, True Metrix Blood Glucose Test, atorvastatin, carvedilol, pantoprazole, tamsulosin, triamcinolone ointment, Vitamin D3, amLODipine, Lantus SoloStar, polyethylene glycol, aspirin EC, insulin aspart, feeding supplement, traZODone, atorvastatin, dorzolamide, sertraline, doxycycline, and levofloxacin.   Follow-up: Pending MRI results   Terri Piedra, MSN, FNP-C Nurse Practitioner [facility] [place] number: [phone]

## 2021-12-20 NOTE — Assessment & Plan Note (Signed)
Blood sugars reviewed from [facility]. Discussed importance of blood sugar control as this can result in complicated healing and increase risk of infection. Encouraged increased protein intake to help with healing as well. Continue diabetes management per Internal Medicine.

## 2021-12-20 NOTE — Patient Instructions (Signed)
Nice to see you.  We will check your lab work today.  Continue to take your medication daily as prescribed.  Continue to take the doxycycline 100 mg twice daily  Change the levofloxacin to 750 mg PO q 48 hours.  Plan for follow up pending MRI results.   Have a great day and stay safe!

## 2021-12-21 DIAGNOSIS — M6281 Muscle weakness (generalized): Secondary | ICD-10-CM | POA: Diagnosis not present

## 2021-12-21 DIAGNOSIS — R278 Other lack of coordination: Secondary | ICD-10-CM | POA: Diagnosis not present

## 2021-12-21 DIAGNOSIS — R41841 Cognitive communication deficit: Secondary | ICD-10-CM | POA: Diagnosis not present

## 2021-12-22 DIAGNOSIS — R278 Other lack of coordination: Secondary | ICD-10-CM | POA: Diagnosis not present

## 2021-12-22 DIAGNOSIS — R41841 Cognitive communication deficit: Secondary | ICD-10-CM | POA: Diagnosis not present

## 2021-12-22 DIAGNOSIS — M6281 Muscle weakness (generalized): Secondary | ICD-10-CM | POA: Diagnosis not present

## 2021-12-22 LAB — COMPREHENSIVE METABOLIC PANEL
AG Ratio: 1 (calc) (ref 1.0–2.5)
ALT: 6 U/L — ABNORMAL LOW (ref 9–46)
AST: 10 U/L (ref 10–35)
Albumin: 3.2 g/dL — ABNORMAL LOW (ref 3.6–5.1)
Alkaline phosphatase (APISO): 101 U/L (ref 35–144)
BUN/Creatinine Ratio: 18 (calc) (ref 6–22)
BUN: 35 mg/dL — ABNORMAL HIGH (ref 7–25)
CO2: 20 mmol/L (ref 20–32)
Calcium: 9 mg/dL (ref 8.6–10.3)
Chloride: 105 mmol/L (ref 98–110)
Creat: 1.93 mg/dL — ABNORMAL HIGH (ref 0.70–1.22)
Globulin: 3.3 g/dL (calc) (ref 1.9–3.7)
Glucose, Bld: 149 mg/dL — ABNORMAL HIGH (ref 65–99)
Potassium: 4.4 mmol/L (ref 3.5–5.3)
Sodium: 137 mmol/L (ref 135–146)
Total Bilirubin: 0.2 mg/dL (ref 0.2–1.2)
Total Protein: 6.5 g/dL (ref 6.1–8.1)

## 2021-12-22 LAB — SEDIMENTATION RATE: Sed Rate: 19 mm/h (ref 0–20)

## 2021-12-22 LAB — C-REACTIVE PROTEIN: CRP: 7.8 mg/L (ref <8.0)

## 2021-12-23 DIAGNOSIS — R278 Other lack of coordination: Secondary | ICD-10-CM | POA: Diagnosis not present

## 2021-12-23 DIAGNOSIS — R41841 Cognitive communication deficit: Secondary | ICD-10-CM | POA: Diagnosis not present

## 2021-12-23 DIAGNOSIS — I679 Cerebrovascular disease, unspecified: Secondary | ICD-10-CM | POA: Diagnosis not present

## 2021-12-23 DIAGNOSIS — I69359 Hemiplegia and hemiparesis following cerebral infarction affecting unspecified side: Secondary | ICD-10-CM | POA: Diagnosis not present

## 2021-12-23 DIAGNOSIS — W19XXXA Unspecified fall, initial encounter: Secondary | ICD-10-CM | POA: Diagnosis not present

## 2021-12-23 DIAGNOSIS — M6281 Muscle weakness (generalized): Secondary | ICD-10-CM | POA: Diagnosis not present

## 2021-12-23 DIAGNOSIS — F0150 Vascular dementia without behavioral disturbance: Secondary | ICD-10-CM | POA: Diagnosis not present

## 2021-12-23 DIAGNOSIS — I152 Hypertension secondary to endocrine disorders: Secondary | ICD-10-CM | POA: Diagnosis not present

## 2021-12-24 DIAGNOSIS — R41841 Cognitive communication deficit: Secondary | ICD-10-CM | POA: Diagnosis not present

## 2021-12-24 DIAGNOSIS — M6281 Muscle weakness (generalized): Secondary | ICD-10-CM | POA: Diagnosis not present

## 2021-12-24 DIAGNOSIS — R278 Other lack of coordination: Secondary | ICD-10-CM | POA: Diagnosis not present

## 2021-12-25 DIAGNOSIS — M6281 Muscle weakness (generalized): Secondary | ICD-10-CM | POA: Diagnosis not present

## 2021-12-25 DIAGNOSIS — R278 Other lack of coordination: Secondary | ICD-10-CM | POA: Diagnosis not present

## 2021-12-25 DIAGNOSIS — R41841 Cognitive communication deficit: Secondary | ICD-10-CM | POA: Diagnosis not present

## 2021-12-27 DIAGNOSIS — M6281 Muscle weakness (generalized): Secondary | ICD-10-CM | POA: Diagnosis not present

## 2021-12-27 DIAGNOSIS — R278 Other lack of coordination: Secondary | ICD-10-CM | POA: Diagnosis not present

## 2021-12-27 DIAGNOSIS — R41841 Cognitive communication deficit: Secondary | ICD-10-CM | POA: Diagnosis not present

## 2021-12-28 DIAGNOSIS — R278 Other lack of coordination: Secondary | ICD-10-CM | POA: Diagnosis not present

## 2021-12-28 DIAGNOSIS — M6281 Muscle weakness (generalized): Secondary | ICD-10-CM | POA: Diagnosis not present

## 2021-12-28 DIAGNOSIS — R41841 Cognitive communication deficit: Secondary | ICD-10-CM | POA: Diagnosis not present

## 2021-12-28 DIAGNOSIS — L97522 Non-pressure chronic ulcer of other part of left foot with fat layer exposed: Secondary | ICD-10-CM | POA: Diagnosis not present

## 2021-12-29 DIAGNOSIS — R278 Other lack of coordination: Secondary | ICD-10-CM | POA: Diagnosis not present

## 2021-12-29 DIAGNOSIS — M6281 Muscle weakness (generalized): Secondary | ICD-10-CM | POA: Diagnosis not present

## 2021-12-29 DIAGNOSIS — R41841 Cognitive communication deficit: Secondary | ICD-10-CM | POA: Diagnosis not present

## 2021-12-30 DIAGNOSIS — R278 Other lack of coordination: Secondary | ICD-10-CM | POA: Diagnosis not present

## 2021-12-30 DIAGNOSIS — M6281 Muscle weakness (generalized): Secondary | ICD-10-CM | POA: Diagnosis not present

## 2021-12-30 DIAGNOSIS — R41841 Cognitive communication deficit: Secondary | ICD-10-CM | POA: Diagnosis not present

## 2021-12-31 DIAGNOSIS — M6281 Muscle weakness (generalized): Secondary | ICD-10-CM | POA: Diagnosis not present

## 2021-12-31 DIAGNOSIS — R278 Other lack of coordination: Secondary | ICD-10-CM | POA: Diagnosis not present

## 2021-12-31 DIAGNOSIS — R41841 Cognitive communication deficit: Secondary | ICD-10-CM | POA: Diagnosis not present

## 2022-01-02 DIAGNOSIS — M6281 Muscle weakness (generalized): Secondary | ICD-10-CM | POA: Diagnosis not present

## 2022-01-02 DIAGNOSIS — R41841 Cognitive communication deficit: Secondary | ICD-10-CM | POA: Diagnosis not present

## 2022-01-02 DIAGNOSIS — R278 Other lack of coordination: Secondary | ICD-10-CM | POA: Diagnosis not present

## 2022-01-03 DIAGNOSIS — R278 Other lack of coordination: Secondary | ICD-10-CM | POA: Diagnosis not present

## 2022-01-03 DIAGNOSIS — R41841 Cognitive communication deficit: Secondary | ICD-10-CM | POA: Diagnosis not present

## 2022-01-03 DIAGNOSIS — M6281 Muscle weakness (generalized): Secondary | ICD-10-CM | POA: Diagnosis not present

## 2022-01-04 DIAGNOSIS — L97522 Non-pressure chronic ulcer of other part of left foot with fat layer exposed: Secondary | ICD-10-CM | POA: Diagnosis not present

## 2022-01-04 DIAGNOSIS — M6281 Muscle weakness (generalized): Secondary | ICD-10-CM | POA: Diagnosis not present

## 2022-01-04 DIAGNOSIS — R41841 Cognitive communication deficit: Secondary | ICD-10-CM | POA: Diagnosis not present

## 2022-01-04 DIAGNOSIS — R278 Other lack of coordination: Secondary | ICD-10-CM | POA: Diagnosis not present

## 2022-01-05 ENCOUNTER — Ambulatory Visit (HOSPITAL_COMMUNITY): Payer: Medicare HMO

## 2022-01-05 DIAGNOSIS — S91105D Unspecified open wound of left lesser toe(s) without damage to nail, subsequent encounter: Secondary | ICD-10-CM | POA: Diagnosis not present

## 2022-01-05 DIAGNOSIS — R278 Other lack of coordination: Secondary | ICD-10-CM | POA: Diagnosis not present

## 2022-01-05 DIAGNOSIS — M6281 Muscle weakness (generalized): Secondary | ICD-10-CM | POA: Diagnosis not present

## 2022-01-05 DIAGNOSIS — R41841 Cognitive communication deficit: Secondary | ICD-10-CM | POA: Diagnosis not present

## 2022-01-06 DIAGNOSIS — R41841 Cognitive communication deficit: Secondary | ICD-10-CM | POA: Diagnosis not present

## 2022-01-06 DIAGNOSIS — R278 Other lack of coordination: Secondary | ICD-10-CM | POA: Diagnosis not present

## 2022-01-06 DIAGNOSIS — M6281 Muscle weakness (generalized): Secondary | ICD-10-CM | POA: Diagnosis not present

## 2022-01-11 ENCOUNTER — Ambulatory Visit (HOSPITAL_COMMUNITY)
Admission: RE | Admit: 2022-01-11 | Discharge: 2022-01-11 | Disposition: A | Discharge status: Home / Self Care | Payer: Medicare HMO | Source: Ambulatory Visit | Attending: Family | Admitting: Family

## 2022-01-11 DIAGNOSIS — R41841 Cognitive communication deficit: Secondary | ICD-10-CM | POA: Diagnosis not present

## 2022-01-11 DIAGNOSIS — Z872 Personal history of diseases of the skin and subcutaneous tissue: Secondary | ICD-10-CM | POA: Diagnosis not present

## 2022-01-11 DIAGNOSIS — R278 Other lack of coordination: Secondary | ICD-10-CM | POA: Diagnosis not present

## 2022-01-11 DIAGNOSIS — M86172 Other acute osteomyelitis, left ankle and foot: Secondary | ICD-10-CM | POA: Diagnosis not present

## 2022-01-11 DIAGNOSIS — L97522 Non-pressure chronic ulcer of other part of left foot with fat layer exposed: Secondary | ICD-10-CM | POA: Diagnosis not present

## 2022-01-11 DIAGNOSIS — S91302A Unspecified open wound, left foot, initial encounter: Secondary | ICD-10-CM | POA: Diagnosis not present

## 2022-01-11 DIAGNOSIS — M609 Myositis, unspecified: Secondary | ICD-10-CM | POA: Diagnosis not present

## 2022-01-11 DIAGNOSIS — M6281 Muscle weakness (generalized): Secondary | ICD-10-CM | POA: Diagnosis not present

## 2022-01-11 DIAGNOSIS — Z9889 Other specified postprocedural states: Secondary | ICD-10-CM | POA: Diagnosis not present

## 2022-01-11 MED ORDER — GADOBUTROL 1 MMOL/ML IV SOLN
8.0000 mL | Freq: Once | INTRAVENOUS | Status: AC | PRN
  Administered 2022-01-11: 8 mL via INTRAVENOUS

## 2022-01-12 ENCOUNTER — Telehealth: Payer: Self-pay | Admitting: Family

## 2022-01-12 DIAGNOSIS — M86172 Other acute osteomyelitis, left ankle and foot: Secondary | ICD-10-CM

## 2022-01-12 NOTE — Telephone Encounter (Signed)
Attempted to contact David Ford regarding results of MRI and was unable to reach him via phone and not able to leave a voicemail as it has not been set up. Based on results of his MRI I would like him to be evaluated by Orthopedics to determine if surgical intervention may be necessary. Will continue with antibiotics for now and referral to Orthopedics to be placed. He is currently a resident of [place].

## 2022-01-14 DIAGNOSIS — Z794 Long term (current) use of insulin: Secondary | ICD-10-CM | POA: Diagnosis not present

## 2022-01-14 DIAGNOSIS — D649 Anemia, unspecified: Secondary | ICD-10-CM | POA: Diagnosis not present

## 2022-01-14 DIAGNOSIS — I152 Hypertension secondary to endocrine disorders: Secondary | ICD-10-CM | POA: Diagnosis not present

## 2022-01-14 DIAGNOSIS — M869 Osteomyelitis, unspecified: Secondary | ICD-10-CM | POA: Diagnosis not present

## 2022-01-14 DIAGNOSIS — E118 Type 2 diabetes mellitus with unspecified complications: Secondary | ICD-10-CM | POA: Diagnosis not present

## 2022-01-14 DIAGNOSIS — N1832 Chronic kidney disease, stage 3b: Secondary | ICD-10-CM | POA: Diagnosis not present

## 2022-01-14 DIAGNOSIS — Z89429 Acquired absence of other toe(s), unspecified side: Secondary | ICD-10-CM | POA: Diagnosis not present

## 2022-01-14 DIAGNOSIS — Z792 Long term (current) use of antibiotics: Secondary | ICD-10-CM | POA: Diagnosis not present

## 2022-01-14 DIAGNOSIS — L97529 Non-pressure chronic ulcer of other part of left foot with unspecified severity: Secondary | ICD-10-CM | POA: Diagnosis not present

## 2022-01-18 DIAGNOSIS — L97522 Non-pressure chronic ulcer of other part of left foot with fat layer exposed: Secondary | ICD-10-CM | POA: Diagnosis not present

## 2022-01-24 ENCOUNTER — Ambulatory Visit (INDEPENDENT_AMBULATORY_CARE_PROVIDER_SITE_OTHER): Payer: Medicare HMO | Admitting: Orthopedic Surgery

## 2022-01-24 ENCOUNTER — Encounter: Payer: Self-pay | Admitting: Orthopedic Surgery

## 2022-01-24 DIAGNOSIS — M86172 Other acute osteomyelitis, left ankle and foot: Secondary | ICD-10-CM | POA: Diagnosis not present

## 2022-01-24 NOTE — Progress Notes (Signed)
Office Visit Note   Patient: David Ford           Date of Birth: 03-17-1941           MRN: [id number] Visit Date: 01/24/2022              Requested by: [address], [place],  [place] [address] PCP: [name], MD  Chief Complaint  Patient presents with   Left Foot - Pain      HPI: Patient is an 81 year old gentleman who is seen for initial evaluation for osteomyelitis fifth metatarsal head left foot.  Patient is status post first and second ray amputations in January 2017.  Patient has had no recent vascular work-up.  Patient has multiple medical problems including recent CVA.  Patient is currently residing in skilled nursing.  He is on Levaquin 750 mg treated by infectious disease MRI scan obtained on July 25 shows osteomyelitis of the fifth metatarsal head.  Assessment & Plan: Visit Diagnoses:  1. Acute osteomyelitis of metatarsal bone of left foot ([place])     Plan: With patient's multiple medical problems and lack of peripheral circulation patient is not a good surgical risk for amputation of the little toe.  Will reevaluate in 3 weeks to see how he progresses on the Levaquin.  Patient most likely will require a major limb amputation on the left.  Follow-Up Instructions: Return in about 3 weeks (around 02/14/2022).   Ortho Exam  Patient is alert, oriented, no adenopathy, well-dressed, normal affect, normal respiratory effort. Examination patient has thin atrophic skin of the left lower extremity.  He does not have palpable pulses.  There is equinus contracture of the Achilles and healed ischemic ulcers over the heel pad.  He has an open draining wound over the fifth metatarsal head that probes to bone.  Imaging: No results found. No images are attached to the encounter.  Labs: Lab Results  Component Value Date   HGBA1C 7.2 (H) 06/24/2021   HGBA1C 7.1 (A) 06/08/2021   HGBA1C 6.9 (A) 01/01/2021   ESRSEDRATE 19 12/20/2021    ESRSEDRATE 53 (H) 06/17/2015   CRP 7.8 12/20/2021   REPTSTATUS 08/04/2017 FINAL 07/30/2017   CULT  07/30/2017    NO GROWTH 5 DAYS Performed at [facility], [place] [address], [place], [place] [address]      Lab Results  Component Value Date   ALBUMIN 3.1 (L) 06/24/2021   ALBUMIN 3.1 (L) 06/23/2021   ALBUMIN 2.9 (L) 06/18/2021   PREALBUMIN 10.0 (L) 06/18/2015    Lab Results  Component Value Date   MG 1.9 06/24/2021   MG 1.8 06/18/2021   MG 1.8 06/17/2021   Lab Results  Component Value Date   VD25OH 35.4 09/14/2016    Lab Results  Component Value Date   PREALBUMIN 10.0 (L) 06/18/2015      Latest Ref Rng & Units 07/01/2021    2:23 AM 06/30/2021    4:35 AM 06/26/2021    2:17 AM  CBC EXTENDED  WBC 4.0 - 10.5 K/uL 8.3  7.1  7.8   RBC 4.22 - 5.81 MIL/uL 4.68  4.41  4.49   Hemoglobin 13.0 - 17.0 g/dL 13.3  12.8  13.0   HCT 39.0 - 52.0 % 39.6  38.2  38.9   Platelets 150 - 400 K/uL 474  375  364   NEUT# 1.7 - 7.7 K/uL 3.4     Lymph# 0.7 - 4.0 K/uL  3.7        There is no height or weight on file to calculate BMI.  Orders:  No orders of the defined types were placed in this encounter.  No orders of the defined types were placed in this encounter.    Procedures: No procedures performed  Clinical Data: No additional findings.  ROS:  All other systems negative, except as noted in the HPI. Review of Systems  Objective: Vital Signs: There were no vitals taken for this visit.  Specialty Comments:  No specialty comments available.  PMFS History: Patient Active Problem List   Diagnosis Date Noted   Acute osteomyelitis of metatarsal bone of left foot ([place]) 12/20/2021   Seizure-like activity ([place]) 62/08/5595   Acute metabolic encephalopathy 41/63/8453   CVA (cerebral vascular accident) ([place]) 06/24/2021   Abdominal distension 06/24/2021   Chronic diastolic CHF (congestive heart failure) ([place]) 06/24/2021   Acute-on-chronic kidney injury ([place]) 06/16/2021    Anemia 06/16/2021   Hypoglycemia 06/15/2021   Dementia without behavioral disturbance ([place]) 06/08/2021   Esophagitis 01/06/2020   Dermatitis 08/20/2018   Glaucoma 01/31/2018   CKD (chronic kidney disease), stage III ([place]) 08/03/2017   Hyperlipidemia associated with type 2 diabetes mellitus ([place]) 06/02/2017   BPH (benign prostatic hyperplasia) 06/02/2017   History of prostate cancer 06/02/2017   Insomnia 06/02/2017   Hypertension associated with diabetes ([place]) 06/17/2015   T2DM (type 2 diabetes mellitus) ([place]) 06/17/2015   Past Medical History:  Diagnosis Date   AKI (acute kidney injury) ([place]) 06/18/2015   Cancer ([place])    hx of prostate cancer   Chronic kidney disease    Diabetes mellitus without complication ([place])    DKA (diabetic ketoacidoses) 06/18/2015   Glaucoma    Hyperlipidemia    Hypertension     Family History  Problem Relation Age of Onset   Diabetes Mother    Hypertension Mother    Stroke Mother    Diabetes Sister    Glaucoma Sister    Diabetes Brother    Glaucoma Brother    Amblyopia Neg Hx    Blindness Neg Hx    Cataracts Neg Hx    Macular degeneration Neg Hx    Retinal detachment Neg Hx    Strabismus Neg Hx    Retinitis pigmentosa Neg Hx     Past Surgical History:  Procedure Laterality Date   AMPUTATION Left 07/03/2015   Procedure: Left Foot 1st and 2nd Ray Amputation;  Surgeon: Newt Minion, MD;  Location: [facility];  Service: Orthopedics;  Laterality: Left;   BASCILIC VEIN TRANSPOSITION Left 07/14/2015   Procedure: BRACHIOBASILIC VEIN TRANSPOSITION  ;  Surgeon: Elam Dutch, MD;  Location: [facility] OR;  Service: Vascular;  Laterality: Left;   IRIDOTOMY / IRIDECTOMY     prostate seeds     Social History   Occupational History   Not on file  Tobacco Use   Smoking status: Never   Smokeless tobacco: Never  Vaping Use   Vaping Use: Never used  Substance and Sexual Activity   Alcohol use: Yes    Comment: once a week   Drug use: No   Sexual activity:  Not on file

## 2022-01-25 DIAGNOSIS — L97522 Non-pressure chronic ulcer of other part of left foot with fat layer exposed: Secondary | ICD-10-CM | POA: Diagnosis not present

## 2022-02-01 DIAGNOSIS — L97522 Non-pressure chronic ulcer of other part of left foot with fat layer exposed: Secondary | ICD-10-CM | POA: Diagnosis not present

## 2022-02-01 DIAGNOSIS — L97523 Non-pressure chronic ulcer of other part of left foot with necrosis of muscle: Secondary | ICD-10-CM | POA: Diagnosis not present

## 2022-02-03 DIAGNOSIS — E11621 Type 2 diabetes mellitus with foot ulcer: Secondary | ICD-10-CM | POA: Diagnosis not present

## 2022-02-04 DIAGNOSIS — I110 Hypertensive heart disease with heart failure: Secondary | ICD-10-CM | POA: Diagnosis not present

## 2022-02-04 DIAGNOSIS — E785 Hyperlipidemia, unspecified: Secondary | ICD-10-CM | POA: Diagnosis not present

## 2022-02-04 DIAGNOSIS — N1832 Chronic kidney disease, stage 3b: Secondary | ICD-10-CM | POA: Diagnosis not present

## 2022-02-04 DIAGNOSIS — M869 Osteomyelitis, unspecified: Secondary | ICD-10-CM | POA: Diagnosis not present

## 2022-02-04 DIAGNOSIS — E1122 Type 2 diabetes mellitus with diabetic chronic kidney disease: Secondary | ICD-10-CM | POA: Diagnosis not present

## 2022-02-04 DIAGNOSIS — E11621 Type 2 diabetes mellitus with foot ulcer: Secondary | ICD-10-CM | POA: Diagnosis not present

## 2022-02-04 DIAGNOSIS — D631 Anemia in chronic kidney disease: Secondary | ICD-10-CM | POA: Diagnosis not present

## 2022-02-04 DIAGNOSIS — I509 Heart failure, unspecified: Secondary | ICD-10-CM | POA: Diagnosis not present

## 2022-02-04 DIAGNOSIS — F32A Depression, unspecified: Secondary | ICD-10-CM | POA: Diagnosis not present

## 2022-02-08 DIAGNOSIS — L97523 Non-pressure chronic ulcer of other part of left foot with necrosis of muscle: Secondary | ICD-10-CM | POA: Diagnosis not present

## 2022-02-09 ENCOUNTER — Telehealth: Payer: Self-pay

## 2022-02-09 ENCOUNTER — Telehealth: Payer: Self-pay | Admitting: Orthopedic Surgery

## 2022-02-09 NOTE — Telephone Encounter (Signed)
Received call from [place] at [facility] needing clarification on antibiotic orders. They have doxycycline '100mg'$  BID and levofloxacin '750mg'$  q48 hours. She also needs end date.   Dee: [place], RN

## 2022-02-09 NOTE — Telephone Encounter (Signed)
Called and David Ford wanted to know who had give the rx for Levaquin 750 mg for this pt. It looks like by Dr. Jess Barters note that this was managed by ID and should call for clarification with that office. Number provided. Confirmed that the  pt will come in for his office visit on 02/15/2022

## 2022-02-09 NOTE — Telephone Encounter (Signed)
Dee Programmer, multimedia) from [facility] requesting a call back to clarifiy pt's medications. Please Dee at [phone] to clarify meds.

## 2022-02-10 DIAGNOSIS — E559 Vitamin D deficiency, unspecified: Secondary | ICD-10-CM | POA: Diagnosis not present

## 2022-02-10 NOTE — Telephone Encounter (Signed)
Returned Dee's call and informed her of Greg's message below. Dee also asked for clarification on directions for antibiotics Doxycylcine 100 MG BID and Levofloxacin 750 MG PO q48h. Dee voiced her understanding.   [place], CMA

## 2022-02-10 NOTE — Telephone Encounter (Signed)
Mr. Neto will need to continue the levofloxacin and doxycyline until at least his next visit with Dr. Sharol Given and can schedule follow up with ID pending treatment decisions.

## 2022-02-10 NOTE — Telephone Encounter (Signed)
Dee at [facility] called to follow up on abx for patient. I advised Hoy Morn will be in the office this afternoon and we will follow up with him and give her a call back.    [place], CMA

## 2022-02-15 ENCOUNTER — Ambulatory Visit (INDEPENDENT_AMBULATORY_CARE_PROVIDER_SITE_OTHER): Payer: Medicare HMO | Admitting: Orthopedic Surgery

## 2022-02-15 DIAGNOSIS — L97422 Non-pressure chronic ulcer of left heel and midfoot with fat layer exposed: Secondary | ICD-10-CM | POA: Diagnosis not present

## 2022-02-15 DIAGNOSIS — L97523 Non-pressure chronic ulcer of other part of left foot with necrosis of muscle: Secondary | ICD-10-CM | POA: Diagnosis not present

## 2022-02-15 DIAGNOSIS — M86172 Other acute osteomyelitis, left ankle and foot: Secondary | ICD-10-CM | POA: Diagnosis not present

## 2022-02-16 DIAGNOSIS — Z23 Encounter for immunization: Secondary | ICD-10-CM | POA: Diagnosis not present

## 2022-02-16 NOTE — Telephone Encounter (Signed)
Dee with [facility] called to see when patient needed to follow up with our office. Karena Addison stated she was advised to call after patient seen Dr. Sharol Given. I did advise Dee as of now Dr. Jess Barters not has not been completed, so I am unable to see his recommendation as to when he feels patient should follow up with our office.

## 2022-02-17 DIAGNOSIS — R41841 Cognitive communication deficit: Secondary | ICD-10-CM | POA: Diagnosis not present

## 2022-02-17 DIAGNOSIS — G9341 Metabolic encephalopathy: Secondary | ICD-10-CM | POA: Diagnosis not present

## 2022-02-17 DIAGNOSIS — R4701 Aphasia: Secondary | ICD-10-CM | POA: Diagnosis not present

## 2022-02-21 DIAGNOSIS — R4701 Aphasia: Secondary | ICD-10-CM | POA: Diagnosis not present

## 2022-02-21 DIAGNOSIS — G9341 Metabolic encephalopathy: Secondary | ICD-10-CM | POA: Diagnosis not present

## 2022-02-21 DIAGNOSIS — E119 Type 2 diabetes mellitus without complications: Secondary | ICD-10-CM | POA: Diagnosis not present

## 2022-02-21 DIAGNOSIS — R41841 Cognitive communication deficit: Secondary | ICD-10-CM | POA: Diagnosis not present

## 2022-02-21 DIAGNOSIS — E559 Vitamin D deficiency, unspecified: Secondary | ICD-10-CM | POA: Diagnosis not present

## 2022-02-22 DIAGNOSIS — L97523 Non-pressure chronic ulcer of other part of left foot with necrosis of muscle: Secondary | ICD-10-CM | POA: Diagnosis not present

## 2022-02-22 DIAGNOSIS — R4701 Aphasia: Secondary | ICD-10-CM | POA: Diagnosis not present

## 2022-02-22 DIAGNOSIS — R41841 Cognitive communication deficit: Secondary | ICD-10-CM | POA: Diagnosis not present

## 2022-02-22 DIAGNOSIS — L97422 Non-pressure chronic ulcer of left heel and midfoot with fat layer exposed: Secondary | ICD-10-CM | POA: Diagnosis not present

## 2022-02-22 DIAGNOSIS — G9341 Metabolic encephalopathy: Secondary | ICD-10-CM | POA: Diagnosis not present

## 2022-02-23 DIAGNOSIS — L603 Nail dystrophy: Secondary | ICD-10-CM | POA: Diagnosis not present

## 2022-02-23 DIAGNOSIS — Z89422 Acquired absence of other left toe(s): Secondary | ICD-10-CM | POA: Diagnosis not present

## 2022-02-23 DIAGNOSIS — Z89412 Acquired absence of left great toe: Secondary | ICD-10-CM | POA: Diagnosis not present

## 2022-02-23 DIAGNOSIS — R41841 Cognitive communication deficit: Secondary | ICD-10-CM | POA: Diagnosis not present

## 2022-02-23 DIAGNOSIS — R4701 Aphasia: Secondary | ICD-10-CM | POA: Diagnosis not present

## 2022-02-23 DIAGNOSIS — I739 Peripheral vascular disease, unspecified: Secondary | ICD-10-CM | POA: Diagnosis not present

## 2022-02-23 DIAGNOSIS — B351 Tinea unguium: Secondary | ICD-10-CM | POA: Diagnosis not present

## 2022-02-23 DIAGNOSIS — G9341 Metabolic encephalopathy: Secondary | ICD-10-CM | POA: Diagnosis not present

## 2022-02-24 DIAGNOSIS — G9341 Metabolic encephalopathy: Secondary | ICD-10-CM | POA: Diagnosis not present

## 2022-02-24 DIAGNOSIS — R41841 Cognitive communication deficit: Secondary | ICD-10-CM | POA: Diagnosis not present

## 2022-02-24 DIAGNOSIS — R4701 Aphasia: Secondary | ICD-10-CM | POA: Diagnosis not present

## 2022-02-25 DIAGNOSIS — G9341 Metabolic encephalopathy: Secondary | ICD-10-CM | POA: Diagnosis not present

## 2022-02-25 DIAGNOSIS — R41841 Cognitive communication deficit: Secondary | ICD-10-CM | POA: Diagnosis not present

## 2022-02-25 DIAGNOSIS — R4701 Aphasia: Secondary | ICD-10-CM | POA: Diagnosis not present

## 2022-02-28 DIAGNOSIS — R41841 Cognitive communication deficit: Secondary | ICD-10-CM | POA: Diagnosis not present

## 2022-02-28 DIAGNOSIS — G9341 Metabolic encephalopathy: Secondary | ICD-10-CM | POA: Diagnosis not present

## 2022-02-28 DIAGNOSIS — R4701 Aphasia: Secondary | ICD-10-CM | POA: Diagnosis not present

## 2022-03-01 DIAGNOSIS — R41841 Cognitive communication deficit: Secondary | ICD-10-CM | POA: Diagnosis not present

## 2022-03-01 DIAGNOSIS — L97422 Non-pressure chronic ulcer of left heel and midfoot with fat layer exposed: Secondary | ICD-10-CM | POA: Diagnosis not present

## 2022-03-01 DIAGNOSIS — L97523 Non-pressure chronic ulcer of other part of left foot with necrosis of muscle: Secondary | ICD-10-CM | POA: Diagnosis not present

## 2022-03-01 DIAGNOSIS — R4701 Aphasia: Secondary | ICD-10-CM | POA: Diagnosis not present

## 2022-03-01 DIAGNOSIS — G9341 Metabolic encephalopathy: Secondary | ICD-10-CM | POA: Diagnosis not present

## 2022-03-02 ENCOUNTER — Encounter: Payer: Self-pay | Admitting: Orthopedic Surgery

## 2022-03-02 DIAGNOSIS — R4701 Aphasia: Secondary | ICD-10-CM | POA: Diagnosis not present

## 2022-03-02 DIAGNOSIS — R41841 Cognitive communication deficit: Secondary | ICD-10-CM | POA: Diagnosis not present

## 2022-03-02 DIAGNOSIS — G9341 Metabolic encephalopathy: Secondary | ICD-10-CM | POA: Diagnosis not present

## 2022-03-02 NOTE — Progress Notes (Signed)
Office Visit Note   Patient: David Ford           Date of Birth: [birth year]           MRN: [id number] Visit Date: 02/15/2022              Requested by: [name], MD [address] [place],  [place] [address] PCP: [name], MD  Chief Complaint  Patient presents with   Left Foot - Follow-up      HPI: Patient is a 81 year old gentleman who presents in follow-up for his left foot.  Patient has completed a course of Levaquin currently wearing bunny boots with dry dressing.  Last visit the ulcer probes to the fifth metatarsal head.  Assessment & Plan: Visit Diagnoses:  1. Acute osteomyelitis of metatarsal bone of left foot ([place])     Plan: Patient wants to continue with conservative therapy.  We will continue with wound care bunny boots pressure offloading.  Follow-Up Instructions: Return in about 4 weeks (around 03/15/2022).   Ortho Exam  Patient is alert, oriented, no adenopathy, well-dressed, normal affect, normal respiratory effort. Examination there is granulation tissue over the fifth metatarsal head there is no cellulitis or drainage there is no exposed bone at this time.  Imaging: No results found. No images are attached to the encounter.  Labs: Lab Results  Component Value Date   HGBA1C 7.2 (H) 06/24/2021   HGBA1C 7.1 (A) 06/08/2021   HGBA1C 6.9 (A) 01/01/2021   ESRSEDRATE 19 12/20/2021   ESRSEDRATE 53 (H) 06/17/2015   CRP 7.8 12/20/2021   REPTSTATUS 08/04/2017 FINAL 07/30/2017   CULT  07/30/2017    NO GROWTH 5 DAYS Performed at [facility], [place] [address] [place], [place] [address]      Lab Results  Component Value Date   ALBUMIN 3.1 (L) 06/24/2021   ALBUMIN 3.1 (L) 06/23/2021   ALBUMIN 2.9 (L) 06/18/2021   PREALBUMIN 10.0 (L) 06/18/2015    Lab Results  Component Value Date   MG 1.9 06/24/2021   MG 1.8 06/18/2021   MG 1.8 06/17/2021   Lab Results  Component Value Date   VD25OH 35.4 09/14/2016    Lab Results  Component  Value Date   PREALBUMIN 10.0 (L) 06/18/2015      Latest Ref Rng & Units 07/01/2021    2:23 AM 06/30/2021    4:35 AM 06/26/2021    2:17 AM  CBC EXTENDED  WBC 4.0 - 10.5 K/uL 8.3  7.1  7.8   RBC 4.22 - 5.81 MIL/uL 4.68  4.41  4.49   Hemoglobin 13.0 - 17.0 g/dL 13.3  12.8  13.0   HCT 39.0 - 52.0 % 39.6  38.2  38.9   Platelets 150 - 400 K/uL 474  375  364   NEUT# 1.7 - 7.7 K/uL 3.4     Lymph# 0.7 - 4.0 K/uL 3.7        There is no height or weight on file to calculate BMI.  Orders:  No orders of the defined types were placed in this encounter.  No orders of the defined types were placed in this encounter.    Procedures: No procedures performed  Clinical Data: No additional findings.  ROS:  All other systems negative, except as noted in the HPI. Review of Systems  Objective: Vital Signs: There were no vitals taken for this visit.  Specialty Comments:  No specialty comments available.  PMFS History: Patient Active Problem List  Diagnosis Date Noted   Acute osteomyelitis of metatarsal bone of left foot ([place]) 12/20/2021   Seizure-like activity ([place]) 94/76/5465   Acute metabolic encephalopathy 03/54/6568   CVA (cerebral vascular accident) ([place]) 06/24/2021   Abdominal distension 06/24/2021   Chronic diastolic CHF (congestive heart failure) ([place]) 06/24/2021   Acute-on-chronic kidney injury ([place]) 06/16/2021   Anemia 06/16/2021   Hypoglycemia 06/15/2021   Dementia without behavioral disturbance ([place]) 06/08/2021   Esophagitis 01/06/2020   Dermatitis 08/20/2018   Glaucoma 01/31/2018   CKD (chronic kidney disease), stage III ([place]) 08/03/2017   Hyperlipidemia associated with type 2 diabetes mellitus ([place]) 06/02/2017   BPH (benign prostatic hyperplasia) 06/02/2017   History of prostate cancer 06/02/2017   Insomnia 06/02/2017   Hypertension associated with diabetes ([place]) 06/17/2015   T2DM (type 2 diabetes mellitus) ([place]) 06/17/2015   Past Medical History:  Diagnosis Date    AKI (acute kidney injury) ([place]) 06/18/2015   Cancer ([place])    hx of prostate cancer   Chronic kidney disease    Diabetes mellitus without complication ([place])    DKA (diabetic ketoacidoses) 06/18/2015   Glaucoma    Hyperlipidemia    Hypertension     Family History  Problem Relation Age of Onset   Diabetes Mother    Hypertension Mother    Stroke Mother    Diabetes Sister    Glaucoma Sister    Diabetes Brother    Glaucoma Brother    Amblyopia Neg Hx    Blindness Neg Hx    Cataracts Neg Hx    Macular degeneration Neg Hx    Retinal detachment Neg Hx    Strabismus Neg Hx    Retinitis pigmentosa Neg Hx     Past Surgical History:  Procedure Laterality Date   AMPUTATION Left 07/03/2015   Procedure: Left Foot 1st and 2nd Ray Amputation;  Surgeon: Newt Minion, MD;  Location: [facility];  Service: Orthopedics;  Laterality: Left;   BASCILIC VEIN TRANSPOSITION Left 07/14/2015   Procedure: BRACHIOBASILIC VEIN TRANSPOSITION  ;  Surgeon: Elam Dutch, MD;  Location: [facility] OR;  Service: Vascular;  Laterality: Left;   IRIDOTOMY / IRIDECTOMY     prostate seeds     Social History   Occupational History   Not on file  Tobacco Use   Smoking status: Never   Smokeless tobacco: Never  Vaping Use   Vaping Use: Never used  Substance and Sexual Activity   Alcohol use: Yes    Comment: once a week   Drug use: No   Sexual activity: Not on file

## 2022-03-03 DIAGNOSIS — E11621 Type 2 diabetes mellitus with foot ulcer: Secondary | ICD-10-CM | POA: Diagnosis not present

## 2022-03-03 DIAGNOSIS — R4701 Aphasia: Secondary | ICD-10-CM | POA: Diagnosis not present

## 2022-03-03 DIAGNOSIS — G9341 Metabolic encephalopathy: Secondary | ICD-10-CM | POA: Diagnosis not present

## 2022-03-03 DIAGNOSIS — R41841 Cognitive communication deficit: Secondary | ICD-10-CM | POA: Diagnosis not present

## 2022-03-04 DIAGNOSIS — G9341 Metabolic encephalopathy: Secondary | ICD-10-CM | POA: Diagnosis not present

## 2022-03-04 DIAGNOSIS — R41841 Cognitive communication deficit: Secondary | ICD-10-CM | POA: Diagnosis not present

## 2022-03-04 DIAGNOSIS — R4701 Aphasia: Secondary | ICD-10-CM | POA: Diagnosis not present

## 2022-03-08 DIAGNOSIS — L97422 Non-pressure chronic ulcer of left heel and midfoot with fat layer exposed: Secondary | ICD-10-CM | POA: Diagnosis not present

## 2022-03-08 DIAGNOSIS — L97523 Non-pressure chronic ulcer of other part of left foot with necrosis of muscle: Secondary | ICD-10-CM | POA: Diagnosis not present

## 2022-03-08 NOTE — Telephone Encounter (Signed)
Ok to continue with no additional antibiotics at this time and continue wound care per Dr. Sharol Given.

## 2022-03-09 NOTE — Telephone Encounter (Signed)
I left a voicemail for [place] with Jud to call our office back to relay treatment plan for the patient. Per Marya Amsler no additional antibiotics needed at this time and continue wound care.

## 2022-03-10 NOTE — Telephone Encounter (Signed)
Staff able to reach SNF and obtained fax number at [phone]) and [phone] station). Orders faxed to stop antibiotics and continue wound care.  Eugenia Mcalpine, LPN

## 2022-03-10 NOTE — Telephone Encounter (Signed)
Call placed to [facility] and informed of updated treatment plan to stop antibiotics and continue with wound care. Order read back and understood, but she is currently not at work yet and requests faxed orders to Rehab. Called Rehab at [phone] to confirm fax. No answer. Will continue to follow up.   David Ford

## 2022-03-14 ENCOUNTER — Encounter: Payer: Self-pay | Admitting: *Deleted

## 2022-03-15 DIAGNOSIS — L97523 Non-pressure chronic ulcer of other part of left foot with necrosis of muscle: Secondary | ICD-10-CM | POA: Diagnosis not present

## 2022-03-17 ENCOUNTER — Encounter: Payer: Self-pay | Admitting: Orthopedic Surgery

## 2022-03-17 ENCOUNTER — Ambulatory Visit (INDEPENDENT_AMBULATORY_CARE_PROVIDER_SITE_OTHER): Payer: Medicare HMO | Admitting: Orthopedic Surgery

## 2022-03-17 DIAGNOSIS — M86172 Other acute osteomyelitis, left ankle and foot: Secondary | ICD-10-CM | POA: Diagnosis not present

## 2022-03-17 NOTE — Progress Notes (Signed)
Office Visit Note   Patient: David Ford           Date of Birth: [birth year]           MRN: [id number] Visit Date: 03/17/2022              Requested by: [name], [place],  [place] [address] PCP: [name], MD  Chief Complaint  Patient presents with   Left Foot - Follow-up      HPI: Patient is a 81 year old gentleman who is seen in follow-up for osteomyelitis left foot.  Patient has completed his course of antibiotics.  He is currently using a foam heel pad a foam boot and a dry dressing.  Assessment & Plan: Visit Diagnoses:  1. Acute osteomyelitis of metatarsal bone of left foot ([place])     Plan: Patient will continue with his current wound care.  Follow-up if patient develops recurrent ulceration or infection.  Follow-Up Instructions: Return if symptoms worsen or fail to improve.   Ortho Exam  Patient is alert, oriented, no adenopathy, well-dressed, normal affect, normal respiratory effort. Examination patient has thin atrophic skin on the left foot there is no cellulitis no drainage there is superficial epithelialization lateral border left foot.  No signs of active infection.  Imaging: No results found. No images are attached to the encounter.  Labs: Lab Results  Component Value Date   HGBA1C 7.2 (H) 06/24/2021   HGBA1C 7.1 (A) 06/08/2021   HGBA1C 6.9 (A) 01/01/2021   ESRSEDRATE 19 12/20/2021   ESRSEDRATE 53 (H) 06/17/2015   CRP 7.8 12/20/2021   REPTSTATUS 08/04/2017 FINAL 07/30/2017   CULT  07/30/2017    NO GROWTH 5 DAYS Performed at [facility], [place] [address], [place], [place] [address]      Lab Results  Component Value Date   ALBUMIN 3.1 (L) 06/24/2021   ALBUMIN 3.1 (L) 06/23/2021   ALBUMIN 2.9 (L) 06/18/2021   PREALBUMIN 10.0 (L) 06/18/2015    Lab Results  Component Value Date   MG 1.9 06/24/2021   MG 1.8 06/18/2021   MG 1.8 06/17/2021   Lab Results  Component Value Date   VD25OH 35.4 09/14/2016     Lab Results  Component Value Date   PREALBUMIN 10.0 (L) 06/18/2015      Latest Ref Rng & Units 07/01/2021    2:23 AM 06/30/2021    4:35 AM 06/26/2021    2:17 AM  CBC EXTENDED  WBC 4.0 - 10.5 K/uL 8.3  7.1  7.8   RBC 4.22 - 5.81 MIL/uL 4.68  4.41  4.49   Hemoglobin 13.0 - 17.0 g/dL 13.3  12.8  13.0   HCT 39.0 - 52.0 % 39.6  38.2  38.9   Platelets 150 - 400 K/uL 474  375  364   NEUT# 1.7 - 7.7 K/uL 3.4     Lymph# 0.7 - 4.0 K/uL 3.7        There is no height or weight on file to calculate BMI.  Orders:  No orders of the defined types were placed in this encounter.  No orders of the defined types were placed in this encounter.    Procedures: No procedures performed  Clinical Data: No additional findings.  ROS:  All other systems negative, except as noted in the HPI. Review of Systems  Objective: Vital Signs: There were no vitals taken for this visit.  Specialty Comments:  No specialty comments available.  PMFS History:  Patient Active Problem List   Diagnosis Date Noted   Acute osteomyelitis of metatarsal bone of left foot ([place]) 12/20/2021   Seizure-like activity ([place]) 71/16/5790   Acute metabolic encephalopathy 38/33/3832   CVA (cerebral vascular accident) ([place]) 06/24/2021   Abdominal distension 06/24/2021   Chronic diastolic CHF (congestive heart failure) ([place]) 06/24/2021   Acute-on-chronic kidney injury ([place]) 06/16/2021   Anemia 06/16/2021   Hypoglycemia 06/15/2021   Dementia without behavioral disturbance ([place]) 06/08/2021   Esophagitis 01/06/2020   Dermatitis 08/20/2018   Glaucoma 01/31/2018   CKD (chronic kidney disease), stage III ([place]) 08/03/2017   Hyperlipidemia associated with type 2 diabetes mellitus ([place]) 06/02/2017   BPH (benign prostatic hyperplasia) 06/02/2017   History of prostate cancer 06/02/2017   Insomnia 06/02/2017   Hypertension associated with diabetes ([place]) 06/17/2015   T2DM (type 2 diabetes mellitus) ([place]) 06/17/2015   Past  Medical History:  Diagnosis Date   AKI (acute kidney injury) ([place]) 06/18/2015   Cancer ([place])    hx of prostate cancer   Chronic kidney disease    Diabetes mellitus without complication ([place])    DKA (diabetic ketoacidoses) 06/18/2015   Glaucoma    Hyperlipidemia    Hypertension     Family History  Problem Relation Age of Onset   Diabetes Mother    Hypertension Mother    Stroke Mother    Diabetes Sister    Glaucoma Sister    Diabetes Brother    Glaucoma Brother    Amblyopia Neg Hx    Blindness Neg Hx    Cataracts Neg Hx    Macular degeneration Neg Hx    Retinal detachment Neg Hx    Strabismus Neg Hx    Retinitis pigmentosa Neg Hx     Past Surgical History:  Procedure Laterality Date   AMPUTATION Left 07/03/2015   Procedure: Left Foot 1st and 2nd Ray Amputation;  Surgeon: Newt Minion, MD;  Location: [facility];  Service: Orthopedics;  Laterality: Left;   BASCILIC VEIN TRANSPOSITION Left 07/14/2015   Procedure: BRACHIOBASILIC VEIN TRANSPOSITION  ;  Surgeon: Elam Dutch, MD;  Location: [facility] OR;  Service: Vascular;  Laterality: Left;   IRIDOTOMY / IRIDECTOMY     prostate seeds     Social History   Occupational History   Not on file  Tobacco Use   Smoking status: Never   Smokeless tobacco: Never  Vaping Use   Vaping Use: Never used  Substance and Sexual Activity   Alcohol use: Yes    Comment: once a week   Drug use: No   Sexual activity: Not on file

## 2022-03-21 DIAGNOSIS — I6381 Other cerebral infarction due to occlusion or stenosis of small artery: Secondary | ICD-10-CM | POA: Diagnosis not present

## 2022-03-21 DIAGNOSIS — F5101 Primary insomnia: Secondary | ICD-10-CM | POA: Diagnosis not present

## 2022-03-21 DIAGNOSIS — F0150 Vascular dementia without behavioral disturbance: Secondary | ICD-10-CM | POA: Diagnosis not present

## 2022-03-22 DIAGNOSIS — L97523 Non-pressure chronic ulcer of other part of left foot with necrosis of muscle: Secondary | ICD-10-CM | POA: Diagnosis not present

## 2022-03-22 DIAGNOSIS — Z7185 Encounter for immunization safety counseling: Secondary | ICD-10-CM | POA: Diagnosis not present

## 2022-03-22 DIAGNOSIS — Z23 Encounter for immunization: Secondary | ICD-10-CM | POA: Diagnosis not present

## 2022-03-23 DIAGNOSIS — N400 Enlarged prostate without lower urinary tract symptoms: Secondary | ICD-10-CM | POA: Diagnosis not present

## 2022-03-23 DIAGNOSIS — E785 Hyperlipidemia, unspecified: Secondary | ICD-10-CM | POA: Diagnosis not present

## 2022-03-23 DIAGNOSIS — M869 Osteomyelitis, unspecified: Secondary | ICD-10-CM | POA: Diagnosis not present

## 2022-03-23 DIAGNOSIS — I69311 Memory deficit following cerebral infarction: Secondary | ICD-10-CM | POA: Diagnosis not present

## 2022-03-23 DIAGNOSIS — I152 Hypertension secondary to endocrine disorders: Secondary | ICD-10-CM | POA: Diagnosis not present

## 2022-03-23 DIAGNOSIS — F0150 Vascular dementia without behavioral disturbance: Secondary | ICD-10-CM | POA: Diagnosis not present

## 2022-03-23 DIAGNOSIS — E1122 Type 2 diabetes mellitus with diabetic chronic kidney disease: Secondary | ICD-10-CM | POA: Diagnosis not present

## 2022-03-23 DIAGNOSIS — E1165 Type 2 diabetes mellitus with hyperglycemia: Secondary | ICD-10-CM | POA: Diagnosis not present

## 2022-03-23 DIAGNOSIS — I129 Hypertensive chronic kidney disease with stage 1 through stage 4 chronic kidney disease, or unspecified chronic kidney disease: Secondary | ICD-10-CM | POA: Diagnosis not present

## 2022-03-23 DIAGNOSIS — N1832 Chronic kidney disease, stage 3b: Secondary | ICD-10-CM | POA: Diagnosis not present

## 2022-03-29 DIAGNOSIS — L97523 Non-pressure chronic ulcer of other part of left foot with necrosis of muscle: Secondary | ICD-10-CM | POA: Diagnosis not present

## 2022-03-31 DIAGNOSIS — E11621 Type 2 diabetes mellitus with foot ulcer: Secondary | ICD-10-CM | POA: Diagnosis not present

## 2022-04-01 ENCOUNTER — Telehealth: Payer: Self-pay | Admitting: *Deleted

## 2022-04-01 NOTE — Patient Outreach (Signed)
  Care Coordination   04/01/2022 Name: David Ford MRN: [id number] DOB: 08/25/1940   Care Coordination Outreach Attempts:  An unsuccessful telephone outreach was attempted today to offer the patient information about available care coordination services as a benefit of their health plan.   Follow Up Plan:  Additional outreach attempts will be made to offer the patient care coordination information and services.   Encounter Outcome:  No Answer  Care Coordination Interventions Activated:  No   Care Coordination Interventions:  No, not indicated    Raina Mina, RN Care Management Coordinator [place] Office [phone]

## 2022-04-05 DIAGNOSIS — L97523 Non-pressure chronic ulcer of other part of left foot with necrosis of muscle: Secondary | ICD-10-CM | POA: Diagnosis not present

## 2022-04-11 ENCOUNTER — Telehealth: Payer: Self-pay | Admitting: *Deleted

## 2022-04-11 NOTE — Patient Instructions (Signed)
Visit Information  Thank you for taking time to visit with me today. Please don't hesitate to contact me if I can be of assistance to you.   Following are the goals we discussed today:   Goals Addressed   None     Please call the care guide team at [phone] if you need to cancel or reschedule your appointment.   If you are experiencing a Mental Health or [place] or need someone to talk to, please call the Suicide and Crisis Lifeline: 988  The patient verbalized understanding of instructions, educational materials, and care plan provided today and DECLINED offer to receive copy of patient instructions, educational materials, and care plan.   No further follow up required: No needs  Raina Mina, RN Care Management Coordinator [place] Office [phone]

## 2022-04-11 NOTE — Patient Outreach (Signed)
  Care Coordination   Initial Visit Note   04/11/2022 Name: David Ford MRN: [id number] DOB: 11-29-1940  David Ford is a 81 y.o. year old male who sees [name], MD for primary care. I  spoke with spouse David Ford  What matters to the patients health and wellness today?  na    Goals Addressed   None     SDOH assessments and interventions completed:  No   Pt LTC resident at [facility].  Care Coordination Interventions Activated:  No  Care Coordination Interventions:  No, not indicated   Follow up plan: No further intervention required.   Encounter Outcome:  Pt. Visit Completed   Raina Mina, RN Care Management Coordinator [place] Office [phone]

## 2022-04-27 DIAGNOSIS — M62472 Contracture of muscle, left ankle and foot: Secondary | ICD-10-CM | POA: Diagnosis not present

## 2022-04-27 DIAGNOSIS — M6281 Muscle weakness (generalized): Secondary | ICD-10-CM | POA: Diagnosis not present

## 2022-04-27 DIAGNOSIS — R2689 Other abnormalities of gait and mobility: Secondary | ICD-10-CM | POA: Diagnosis not present

## 2022-04-27 DIAGNOSIS — G9341 Metabolic encephalopathy: Secondary | ICD-10-CM | POA: Diagnosis not present

## 2022-04-28 DIAGNOSIS — M6281 Muscle weakness (generalized): Secondary | ICD-10-CM | POA: Diagnosis not present

## 2022-04-28 DIAGNOSIS — G9341 Metabolic encephalopathy: Secondary | ICD-10-CM | POA: Diagnosis not present

## 2022-04-28 DIAGNOSIS — M62472 Contracture of muscle, left ankle and foot: Secondary | ICD-10-CM | POA: Diagnosis not present

## 2022-04-28 DIAGNOSIS — R2689 Other abnormalities of gait and mobility: Secondary | ICD-10-CM | POA: Diagnosis not present

## 2022-04-29 DIAGNOSIS — M6281 Muscle weakness (generalized): Secondary | ICD-10-CM | POA: Diagnosis not present

## 2022-04-29 DIAGNOSIS — G9341 Metabolic encephalopathy: Secondary | ICD-10-CM | POA: Diagnosis not present

## 2022-04-29 DIAGNOSIS — R2689 Other abnormalities of gait and mobility: Secondary | ICD-10-CM | POA: Diagnosis not present

## 2022-04-29 DIAGNOSIS — M62472 Contracture of muscle, left ankle and foot: Secondary | ICD-10-CM | POA: Diagnosis not present

## 2022-05-02 DIAGNOSIS — R2689 Other abnormalities of gait and mobility: Secondary | ICD-10-CM | POA: Diagnosis not present

## 2022-05-02 DIAGNOSIS — M6281 Muscle weakness (generalized): Secondary | ICD-10-CM | POA: Diagnosis not present

## 2022-05-02 DIAGNOSIS — G9341 Metabolic encephalopathy: Secondary | ICD-10-CM | POA: Diagnosis not present

## 2022-05-02 DIAGNOSIS — M62472 Contracture of muscle, left ankle and foot: Secondary | ICD-10-CM | POA: Diagnosis not present

## 2022-05-03 DIAGNOSIS — M62472 Contracture of muscle, left ankle and foot: Secondary | ICD-10-CM | POA: Diagnosis not present

## 2022-05-03 DIAGNOSIS — M6281 Muscle weakness (generalized): Secondary | ICD-10-CM | POA: Diagnosis not present

## 2022-05-03 DIAGNOSIS — G9341 Metabolic encephalopathy: Secondary | ICD-10-CM | POA: Diagnosis not present

## 2022-05-03 DIAGNOSIS — R2689 Other abnormalities of gait and mobility: Secondary | ICD-10-CM | POA: Diagnosis not present

## 2022-05-04 DIAGNOSIS — R2689 Other abnormalities of gait and mobility: Secondary | ICD-10-CM | POA: Diagnosis not present

## 2022-05-04 DIAGNOSIS — G9341 Metabolic encephalopathy: Secondary | ICD-10-CM | POA: Diagnosis not present

## 2022-05-04 DIAGNOSIS — M6281 Muscle weakness (generalized): Secondary | ICD-10-CM | POA: Diagnosis not present

## 2022-05-04 DIAGNOSIS — M62472 Contracture of muscle, left ankle and foot: Secondary | ICD-10-CM | POA: Diagnosis not present

## 2022-05-06 DIAGNOSIS — M62472 Contracture of muscle, left ankle and foot: Secondary | ICD-10-CM | POA: Diagnosis not present

## 2022-05-06 DIAGNOSIS — G9341 Metabolic encephalopathy: Secondary | ICD-10-CM | POA: Diagnosis not present

## 2022-05-06 DIAGNOSIS — R2689 Other abnormalities of gait and mobility: Secondary | ICD-10-CM | POA: Diagnosis not present

## 2022-05-06 DIAGNOSIS — M6281 Muscle weakness (generalized): Secondary | ICD-10-CM | POA: Diagnosis not present

## 2022-05-07 DIAGNOSIS — G9341 Metabolic encephalopathy: Secondary | ICD-10-CM | POA: Diagnosis not present

## 2022-05-07 DIAGNOSIS — M62472 Contracture of muscle, left ankle and foot: Secondary | ICD-10-CM | POA: Diagnosis not present

## 2022-05-07 DIAGNOSIS — R2689 Other abnormalities of gait and mobility: Secondary | ICD-10-CM | POA: Diagnosis not present

## 2022-05-07 DIAGNOSIS — M6281 Muscle weakness (generalized): Secondary | ICD-10-CM | POA: Diagnosis not present

## 2022-05-09 DIAGNOSIS — G9341 Metabolic encephalopathy: Secondary | ICD-10-CM | POA: Diagnosis not present

## 2022-05-09 DIAGNOSIS — I6381 Other cerebral infarction due to occlusion or stenosis of small artery: Secondary | ICD-10-CM | POA: Diagnosis not present

## 2022-05-09 DIAGNOSIS — F03A0 Unspecified dementia, mild, without behavioral disturbance, psychotic disturbance, mood disturbance, and anxiety: Secondary | ICD-10-CM | POA: Diagnosis not present

## 2022-05-09 DIAGNOSIS — F5101 Primary insomnia: Secondary | ICD-10-CM | POA: Diagnosis not present

## 2022-05-09 DIAGNOSIS — R2689 Other abnormalities of gait and mobility: Secondary | ICD-10-CM | POA: Diagnosis not present

## 2022-05-09 DIAGNOSIS — M6281 Muscle weakness (generalized): Secondary | ICD-10-CM | POA: Diagnosis not present

## 2022-05-09 DIAGNOSIS — M62472 Contracture of muscle, left ankle and foot: Secondary | ICD-10-CM | POA: Diagnosis not present

## 2022-05-10 DIAGNOSIS — M6281 Muscle weakness (generalized): Secondary | ICD-10-CM | POA: Diagnosis not present

## 2022-05-10 DIAGNOSIS — G9341 Metabolic encephalopathy: Secondary | ICD-10-CM | POA: Diagnosis not present

## 2022-05-10 DIAGNOSIS — R2689 Other abnormalities of gait and mobility: Secondary | ICD-10-CM | POA: Diagnosis not present

## 2022-05-10 DIAGNOSIS — M62472 Contracture of muscle, left ankle and foot: Secondary | ICD-10-CM | POA: Diagnosis not present

## 2022-05-11 DIAGNOSIS — M62472 Contracture of muscle, left ankle and foot: Secondary | ICD-10-CM | POA: Diagnosis not present

## 2022-05-11 DIAGNOSIS — R2689 Other abnormalities of gait and mobility: Secondary | ICD-10-CM | POA: Diagnosis not present

## 2022-05-11 DIAGNOSIS — G9341 Metabolic encephalopathy: Secondary | ICD-10-CM | POA: Diagnosis not present

## 2022-05-11 DIAGNOSIS — M6281 Muscle weakness (generalized): Secondary | ICD-10-CM | POA: Diagnosis not present

## 2022-05-12 DIAGNOSIS — M62472 Contracture of muscle, left ankle and foot: Secondary | ICD-10-CM | POA: Diagnosis not present

## 2022-05-12 DIAGNOSIS — M6281 Muscle weakness (generalized): Secondary | ICD-10-CM | POA: Diagnosis not present

## 2022-05-12 DIAGNOSIS — R2689 Other abnormalities of gait and mobility: Secondary | ICD-10-CM | POA: Diagnosis not present

## 2022-05-12 DIAGNOSIS — G9341 Metabolic encephalopathy: Secondary | ICD-10-CM | POA: Diagnosis not present

## 2022-05-13 DIAGNOSIS — R2689 Other abnormalities of gait and mobility: Secondary | ICD-10-CM | POA: Diagnosis not present

## 2022-05-13 DIAGNOSIS — M62472 Contracture of muscle, left ankle and foot: Secondary | ICD-10-CM | POA: Diagnosis not present

## 2022-05-13 DIAGNOSIS — M6281 Muscle weakness (generalized): Secondary | ICD-10-CM | POA: Diagnosis not present

## 2022-05-13 DIAGNOSIS — G9341 Metabolic encephalopathy: Secondary | ICD-10-CM | POA: Diagnosis not present

## 2022-05-16 DIAGNOSIS — G9341 Metabolic encephalopathy: Secondary | ICD-10-CM | POA: Diagnosis not present

## 2022-05-16 DIAGNOSIS — M6281 Muscle weakness (generalized): Secondary | ICD-10-CM | POA: Diagnosis not present

## 2022-05-16 DIAGNOSIS — R2689 Other abnormalities of gait and mobility: Secondary | ICD-10-CM | POA: Diagnosis not present

## 2022-05-16 DIAGNOSIS — M62472 Contracture of muscle, left ankle and foot: Secondary | ICD-10-CM | POA: Diagnosis not present

## 2022-05-17 DIAGNOSIS — R2689 Other abnormalities of gait and mobility: Secondary | ICD-10-CM | POA: Diagnosis not present

## 2022-05-17 DIAGNOSIS — M62472 Contracture of muscle, left ankle and foot: Secondary | ICD-10-CM | POA: Diagnosis not present

## 2022-05-17 DIAGNOSIS — G9341 Metabolic encephalopathy: Secondary | ICD-10-CM | POA: Diagnosis not present

## 2022-05-17 DIAGNOSIS — M6281 Muscle weakness (generalized): Secondary | ICD-10-CM | POA: Diagnosis not present

## 2022-05-18 DIAGNOSIS — M62472 Contracture of muscle, left ankle and foot: Secondary | ICD-10-CM | POA: Diagnosis not present

## 2022-05-18 DIAGNOSIS — R2689 Other abnormalities of gait and mobility: Secondary | ICD-10-CM | POA: Diagnosis not present

## 2022-05-18 DIAGNOSIS — G9341 Metabolic encephalopathy: Secondary | ICD-10-CM | POA: Diagnosis not present

## 2022-05-18 DIAGNOSIS — M6281 Muscle weakness (generalized): Secondary | ICD-10-CM | POA: Diagnosis not present

## 2022-05-19 DIAGNOSIS — M62472 Contracture of muscle, left ankle and foot: Secondary | ICD-10-CM | POA: Diagnosis not present

## 2022-05-19 DIAGNOSIS — R2689 Other abnormalities of gait and mobility: Secondary | ICD-10-CM | POA: Diagnosis not present

## 2022-05-19 DIAGNOSIS — M6281 Muscle weakness (generalized): Secondary | ICD-10-CM | POA: Diagnosis not present

## 2022-05-19 DIAGNOSIS — G9341 Metabolic encephalopathy: Secondary | ICD-10-CM | POA: Diagnosis not present

## 2022-05-20 DIAGNOSIS — R2689 Other abnormalities of gait and mobility: Secondary | ICD-10-CM | POA: Diagnosis not present

## 2022-05-20 DIAGNOSIS — M6281 Muscle weakness (generalized): Secondary | ICD-10-CM | POA: Diagnosis not present

## 2022-05-20 DIAGNOSIS — G9341 Metabolic encephalopathy: Secondary | ICD-10-CM | POA: Diagnosis not present

## 2022-05-20 DIAGNOSIS — M62472 Contracture of muscle, left ankle and foot: Secondary | ICD-10-CM | POA: Diagnosis not present

## 2022-05-23 DIAGNOSIS — G9341 Metabolic encephalopathy: Secondary | ICD-10-CM | POA: Diagnosis not present

## 2022-05-23 DIAGNOSIS — M62472 Contracture of muscle, left ankle and foot: Secondary | ICD-10-CM | POA: Diagnosis not present

## 2022-05-23 DIAGNOSIS — M6281 Muscle weakness (generalized): Secondary | ICD-10-CM | POA: Diagnosis not present

## 2022-05-23 DIAGNOSIS — R2689 Other abnormalities of gait and mobility: Secondary | ICD-10-CM | POA: Diagnosis not present

## 2022-05-24 DIAGNOSIS — R2689 Other abnormalities of gait and mobility: Secondary | ICD-10-CM | POA: Diagnosis not present

## 2022-05-24 DIAGNOSIS — M6281 Muscle weakness (generalized): Secondary | ICD-10-CM | POA: Diagnosis not present

## 2022-05-24 DIAGNOSIS — G9341 Metabolic encephalopathy: Secondary | ICD-10-CM | POA: Diagnosis not present

## 2022-05-24 DIAGNOSIS — M62472 Contracture of muscle, left ankle and foot: Secondary | ICD-10-CM | POA: Diagnosis not present

## 2022-05-25 DIAGNOSIS — G9341 Metabolic encephalopathy: Secondary | ICD-10-CM | POA: Diagnosis not present

## 2022-05-25 DIAGNOSIS — M62472 Contracture of muscle, left ankle and foot: Secondary | ICD-10-CM | POA: Diagnosis not present

## 2022-05-25 DIAGNOSIS — R2689 Other abnormalities of gait and mobility: Secondary | ICD-10-CM | POA: Diagnosis not present

## 2022-05-25 DIAGNOSIS — M6281 Muscle weakness (generalized): Secondary | ICD-10-CM | POA: Diagnosis not present

## 2022-05-26 DIAGNOSIS — M62472 Contracture of muscle, left ankle and foot: Secondary | ICD-10-CM | POA: Diagnosis not present

## 2022-05-26 DIAGNOSIS — G9341 Metabolic encephalopathy: Secondary | ICD-10-CM | POA: Diagnosis not present

## 2022-05-26 DIAGNOSIS — M6281 Muscle weakness (generalized): Secondary | ICD-10-CM | POA: Diagnosis not present

## 2022-05-26 DIAGNOSIS — R2689 Other abnormalities of gait and mobility: Secondary | ICD-10-CM | POA: Diagnosis not present

## 2022-05-27 DIAGNOSIS — M62472 Contracture of muscle, left ankle and foot: Secondary | ICD-10-CM | POA: Diagnosis not present

## 2022-05-27 DIAGNOSIS — G9341 Metabolic encephalopathy: Secondary | ICD-10-CM | POA: Diagnosis not present

## 2022-05-27 DIAGNOSIS — R2689 Other abnormalities of gait and mobility: Secondary | ICD-10-CM | POA: Diagnosis not present

## 2022-05-27 DIAGNOSIS — M6281 Muscle weakness (generalized): Secondary | ICD-10-CM | POA: Diagnosis not present

## 2022-05-30 DIAGNOSIS — M6281 Muscle weakness (generalized): Secondary | ICD-10-CM | POA: Diagnosis not present

## 2022-05-30 DIAGNOSIS — M62472 Contracture of muscle, left ankle and foot: Secondary | ICD-10-CM | POA: Diagnosis not present

## 2022-05-30 DIAGNOSIS — R2689 Other abnormalities of gait and mobility: Secondary | ICD-10-CM | POA: Diagnosis not present

## 2022-05-30 DIAGNOSIS — G9341 Metabolic encephalopathy: Secondary | ICD-10-CM | POA: Diagnosis not present

## 2022-05-31 DIAGNOSIS — R2689 Other abnormalities of gait and mobility: Secondary | ICD-10-CM | POA: Diagnosis not present

## 2022-05-31 DIAGNOSIS — M62472 Contracture of muscle, left ankle and foot: Secondary | ICD-10-CM | POA: Diagnosis not present

## 2022-05-31 DIAGNOSIS — G9341 Metabolic encephalopathy: Secondary | ICD-10-CM | POA: Diagnosis not present

## 2022-05-31 DIAGNOSIS — M6281 Muscle weakness (generalized): Secondary | ICD-10-CM | POA: Diagnosis not present

## 2022-06-01 DIAGNOSIS — M6281 Muscle weakness (generalized): Secondary | ICD-10-CM | POA: Diagnosis not present

## 2022-06-01 DIAGNOSIS — G9341 Metabolic encephalopathy: Secondary | ICD-10-CM | POA: Diagnosis not present

## 2022-06-01 DIAGNOSIS — R2689 Other abnormalities of gait and mobility: Secondary | ICD-10-CM | POA: Diagnosis not present

## 2022-06-01 DIAGNOSIS — M62472 Contracture of muscle, left ankle and foot: Secondary | ICD-10-CM | POA: Diagnosis not present

## 2022-06-02 DIAGNOSIS — M6281 Muscle weakness (generalized): Secondary | ICD-10-CM | POA: Diagnosis not present

## 2022-06-02 DIAGNOSIS — B351 Tinea unguium: Secondary | ICD-10-CM | POA: Diagnosis not present

## 2022-06-02 DIAGNOSIS — Z89412 Acquired absence of left great toe: Secondary | ICD-10-CM | POA: Diagnosis not present

## 2022-06-02 DIAGNOSIS — G9341 Metabolic encephalopathy: Secondary | ICD-10-CM | POA: Diagnosis not present

## 2022-06-02 DIAGNOSIS — I739 Peripheral vascular disease, unspecified: Secondary | ICD-10-CM | POA: Diagnosis not present

## 2022-06-02 DIAGNOSIS — M62472 Contracture of muscle, left ankle and foot: Secondary | ICD-10-CM | POA: Diagnosis not present

## 2022-06-02 DIAGNOSIS — Z89422 Acquired absence of other left toe(s): Secondary | ICD-10-CM | POA: Diagnosis not present

## 2022-06-02 DIAGNOSIS — R2689 Other abnormalities of gait and mobility: Secondary | ICD-10-CM | POA: Diagnosis not present

## 2022-06-03 DIAGNOSIS — M62472 Contracture of muscle, left ankle and foot: Secondary | ICD-10-CM | POA: Diagnosis not present

## 2022-06-03 DIAGNOSIS — R2689 Other abnormalities of gait and mobility: Secondary | ICD-10-CM | POA: Diagnosis not present

## 2022-06-03 DIAGNOSIS — G9341 Metabolic encephalopathy: Secondary | ICD-10-CM | POA: Diagnosis not present

## 2022-06-03 DIAGNOSIS — M6281 Muscle weakness (generalized): Secondary | ICD-10-CM | POA: Diagnosis not present

## 2022-06-06 DIAGNOSIS — M6281 Muscle weakness (generalized): Secondary | ICD-10-CM | POA: Diagnosis not present

## 2022-06-06 DIAGNOSIS — G9341 Metabolic encephalopathy: Secondary | ICD-10-CM | POA: Diagnosis not present

## 2022-06-06 DIAGNOSIS — M62472 Contracture of muscle, left ankle and foot: Secondary | ICD-10-CM | POA: Diagnosis not present

## 2022-06-06 DIAGNOSIS — R2689 Other abnormalities of gait and mobility: Secondary | ICD-10-CM | POA: Diagnosis not present

## 2022-06-07 DIAGNOSIS — M62472 Contracture of muscle, left ankle and foot: Secondary | ICD-10-CM | POA: Diagnosis not present

## 2022-06-07 DIAGNOSIS — M6281 Muscle weakness (generalized): Secondary | ICD-10-CM | POA: Diagnosis not present

## 2022-06-07 DIAGNOSIS — G9341 Metabolic encephalopathy: Secondary | ICD-10-CM | POA: Diagnosis not present

## 2022-06-07 DIAGNOSIS — R2689 Other abnormalities of gait and mobility: Secondary | ICD-10-CM | POA: Diagnosis not present

## 2022-06-08 DIAGNOSIS — G9341 Metabolic encephalopathy: Secondary | ICD-10-CM | POA: Diagnosis not present

## 2022-06-08 DIAGNOSIS — R2689 Other abnormalities of gait and mobility: Secondary | ICD-10-CM | POA: Diagnosis not present

## 2022-06-08 DIAGNOSIS — M6281 Muscle weakness (generalized): Secondary | ICD-10-CM | POA: Diagnosis not present

## 2022-06-08 DIAGNOSIS — M62472 Contracture of muscle, left ankle and foot: Secondary | ICD-10-CM | POA: Diagnosis not present

## 2022-06-09 DIAGNOSIS — R2689 Other abnormalities of gait and mobility: Secondary | ICD-10-CM | POA: Diagnosis not present

## 2022-06-09 DIAGNOSIS — G9341 Metabolic encephalopathy: Secondary | ICD-10-CM | POA: Diagnosis not present

## 2022-06-09 DIAGNOSIS — M6281 Muscle weakness (generalized): Secondary | ICD-10-CM | POA: Diagnosis not present

## 2022-06-09 DIAGNOSIS — M62472 Contracture of muscle, left ankle and foot: Secondary | ICD-10-CM | POA: Diagnosis not present

## 2022-06-10 DIAGNOSIS — E1122 Type 2 diabetes mellitus with diabetic chronic kidney disease: Secondary | ICD-10-CM | POA: Diagnosis not present

## 2022-06-10 DIAGNOSIS — G9341 Metabolic encephalopathy: Secondary | ICD-10-CM | POA: Diagnosis not present

## 2022-06-10 DIAGNOSIS — N1832 Chronic kidney disease, stage 3b: Secondary | ICD-10-CM | POA: Diagnosis not present

## 2022-06-10 DIAGNOSIS — M6281 Muscle weakness (generalized): Secondary | ICD-10-CM | POA: Diagnosis not present

## 2022-06-10 DIAGNOSIS — R2689 Other abnormalities of gait and mobility: Secondary | ICD-10-CM | POA: Diagnosis not present

## 2022-06-10 DIAGNOSIS — E785 Hyperlipidemia, unspecified: Secondary | ICD-10-CM | POA: Diagnosis not present

## 2022-06-10 DIAGNOSIS — Z7982 Long term (current) use of aspirin: Secondary | ICD-10-CM | POA: Diagnosis not present

## 2022-06-10 DIAGNOSIS — Z794 Long term (current) use of insulin: Secondary | ICD-10-CM | POA: Diagnosis not present

## 2022-06-10 DIAGNOSIS — I69311 Memory deficit following cerebral infarction: Secondary | ICD-10-CM | POA: Diagnosis not present

## 2022-06-10 DIAGNOSIS — M62472 Contracture of muscle, left ankle and foot: Secondary | ICD-10-CM | POA: Diagnosis not present

## 2022-06-10 DIAGNOSIS — Z7189 Other specified counseling: Secondary | ICD-10-CM | POA: Diagnosis not present

## 2022-06-13 DIAGNOSIS — M6281 Muscle weakness (generalized): Secondary | ICD-10-CM | POA: Diagnosis not present

## 2022-06-13 DIAGNOSIS — G9341 Metabolic encephalopathy: Secondary | ICD-10-CM | POA: Diagnosis not present

## 2022-06-13 DIAGNOSIS — R2689 Other abnormalities of gait and mobility: Secondary | ICD-10-CM | POA: Diagnosis not present

## 2022-06-13 DIAGNOSIS — M62472 Contracture of muscle, left ankle and foot: Secondary | ICD-10-CM | POA: Diagnosis not present

## 2022-06-14 DIAGNOSIS — R2689 Other abnormalities of gait and mobility: Secondary | ICD-10-CM | POA: Diagnosis not present

## 2022-06-14 DIAGNOSIS — G9341 Metabolic encephalopathy: Secondary | ICD-10-CM | POA: Diagnosis not present

## 2022-06-14 DIAGNOSIS — M6281 Muscle weakness (generalized): Secondary | ICD-10-CM | POA: Diagnosis not present

## 2022-06-14 DIAGNOSIS — M62472 Contracture of muscle, left ankle and foot: Secondary | ICD-10-CM | POA: Diagnosis not present

## 2022-06-15 DIAGNOSIS — M62472 Contracture of muscle, left ankle and foot: Secondary | ICD-10-CM | POA: Diagnosis not present

## 2022-06-15 DIAGNOSIS — R2689 Other abnormalities of gait and mobility: Secondary | ICD-10-CM | POA: Diagnosis not present

## 2022-06-15 DIAGNOSIS — G9341 Metabolic encephalopathy: Secondary | ICD-10-CM | POA: Diagnosis not present

## 2022-06-15 DIAGNOSIS — M6281 Muscle weakness (generalized): Secondary | ICD-10-CM | POA: Diagnosis not present

## 2022-06-16 DIAGNOSIS — M6281 Muscle weakness (generalized): Secondary | ICD-10-CM | POA: Diagnosis not present

## 2022-06-16 DIAGNOSIS — G9341 Metabolic encephalopathy: Secondary | ICD-10-CM | POA: Diagnosis not present

## 2022-06-16 DIAGNOSIS — R2689 Other abnormalities of gait and mobility: Secondary | ICD-10-CM | POA: Diagnosis not present

## 2022-06-16 DIAGNOSIS — M62472 Contracture of muscle, left ankle and foot: Secondary | ICD-10-CM | POA: Diagnosis not present

## 2022-06-17 DIAGNOSIS — M6281 Muscle weakness (generalized): Secondary | ICD-10-CM | POA: Diagnosis not present

## 2022-06-17 DIAGNOSIS — R2689 Other abnormalities of gait and mobility: Secondary | ICD-10-CM | POA: Diagnosis not present

## 2022-06-17 DIAGNOSIS — G9341 Metabolic encephalopathy: Secondary | ICD-10-CM | POA: Diagnosis not present

## 2022-06-17 DIAGNOSIS — M62472 Contracture of muscle, left ankle and foot: Secondary | ICD-10-CM | POA: Diagnosis not present

## 2022-06-20 DIAGNOSIS — R2689 Other abnormalities of gait and mobility: Secondary | ICD-10-CM | POA: Diagnosis not present

## 2022-06-20 DIAGNOSIS — G9341 Metabolic encephalopathy: Secondary | ICD-10-CM | POA: Diagnosis not present

## 2022-06-20 DIAGNOSIS — M6281 Muscle weakness (generalized): Secondary | ICD-10-CM | POA: Diagnosis not present

## 2022-06-20 DIAGNOSIS — M62472 Contracture of muscle, left ankle and foot: Secondary | ICD-10-CM | POA: Diagnosis not present

## 2022-07-08 DIAGNOSIS — E119 Type 2 diabetes mellitus without complications: Secondary | ICD-10-CM | POA: Diagnosis not present

## 2022-07-08 DIAGNOSIS — D510 Vitamin B12 deficiency anemia due to intrinsic factor deficiency: Secondary | ICD-10-CM | POA: Diagnosis not present

## 2022-07-08 DIAGNOSIS — E559 Vitamin D deficiency, unspecified: Secondary | ICD-10-CM | POA: Diagnosis not present

## 2022-07-08 DIAGNOSIS — E7800 Pure hypercholesterolemia, unspecified: Secondary | ICD-10-CM | POA: Diagnosis not present

## 2022-07-14 DIAGNOSIS — D3131 Benign neoplasm of right choroid: Secondary | ICD-10-CM | POA: Diagnosis not present

## 2022-07-14 DIAGNOSIS — E119 Type 2 diabetes mellitus without complications: Secondary | ICD-10-CM | POA: Diagnosis not present

## 2022-07-14 DIAGNOSIS — H401134 Primary open-angle glaucoma, bilateral, indeterminate stage: Secondary | ICD-10-CM | POA: Diagnosis not present

## 2022-07-26 DIAGNOSIS — E1122 Type 2 diabetes mellitus with diabetic chronic kidney disease: Secondary | ICD-10-CM | POA: Diagnosis not present

## 2022-07-26 DIAGNOSIS — E785 Hyperlipidemia, unspecified: Secondary | ICD-10-CM | POA: Diagnosis not present

## 2022-07-26 DIAGNOSIS — I152 Hypertension secondary to endocrine disorders: Secondary | ICD-10-CM | POA: Diagnosis not present

## 2022-07-26 DIAGNOSIS — I129 Hypertensive chronic kidney disease with stage 1 through stage 4 chronic kidney disease, or unspecified chronic kidney disease: Secondary | ICD-10-CM | POA: Diagnosis not present

## 2022-07-26 DIAGNOSIS — Z7902 Long term (current) use of antithrombotics/antiplatelets: Secondary | ICD-10-CM | POA: Diagnosis not present

## 2022-07-26 DIAGNOSIS — F0150 Vascular dementia without behavioral disturbance: Secondary | ICD-10-CM | POA: Diagnosis not present

## 2022-07-26 DIAGNOSIS — Z89429 Acquired absence of other toe(s), unspecified side: Secondary | ICD-10-CM | POA: Diagnosis not present

## 2022-07-26 DIAGNOSIS — N1832 Chronic kidney disease, stage 3b: Secondary | ICD-10-CM | POA: Diagnosis not present

## 2022-07-26 DIAGNOSIS — E11621 Type 2 diabetes mellitus with foot ulcer: Secondary | ICD-10-CM | POA: Diagnosis not present

## 2022-07-26 DIAGNOSIS — N400 Enlarged prostate without lower urinary tract symptoms: Secondary | ICD-10-CM | POA: Diagnosis not present

## 2022-07-26 DIAGNOSIS — Z794 Long term (current) use of insulin: Secondary | ICD-10-CM | POA: Diagnosis not present

## 2022-08-15 DIAGNOSIS — F5101 Primary insomnia: Secondary | ICD-10-CM | POA: Diagnosis not present

## 2022-08-15 DIAGNOSIS — I6381 Other cerebral infarction due to occlusion or stenosis of small artery: Secondary | ICD-10-CM | POA: Diagnosis not present

## 2022-08-15 DIAGNOSIS — F0150 Vascular dementia without behavioral disturbance: Secondary | ICD-10-CM | POA: Diagnosis not present

## 2022-08-24 DIAGNOSIS — R2689 Other abnormalities of gait and mobility: Secondary | ICD-10-CM | POA: Diagnosis not present

## 2022-08-24 DIAGNOSIS — F0390 Unspecified dementia without behavioral disturbance: Secondary | ICD-10-CM | POA: Diagnosis not present

## 2022-08-24 DIAGNOSIS — M6281 Muscle weakness (generalized): Secondary | ICD-10-CM | POA: Diagnosis not present

## 2022-08-25 DIAGNOSIS — R2689 Other abnormalities of gait and mobility: Secondary | ICD-10-CM | POA: Diagnosis not present

## 2022-08-25 DIAGNOSIS — M6281 Muscle weakness (generalized): Secondary | ICD-10-CM | POA: Diagnosis not present

## 2022-08-25 DIAGNOSIS — F0390 Unspecified dementia without behavioral disturbance: Secondary | ICD-10-CM | POA: Diagnosis not present

## 2022-08-26 DIAGNOSIS — F0390 Unspecified dementia without behavioral disturbance: Secondary | ICD-10-CM | POA: Diagnosis not present

## 2022-08-26 DIAGNOSIS — R2689 Other abnormalities of gait and mobility: Secondary | ICD-10-CM | POA: Diagnosis not present

## 2022-08-26 DIAGNOSIS — M6281 Muscle weakness (generalized): Secondary | ICD-10-CM | POA: Diagnosis not present

## 2022-08-29 DIAGNOSIS — F0390 Unspecified dementia without behavioral disturbance: Secondary | ICD-10-CM | POA: Diagnosis not present

## 2022-08-29 DIAGNOSIS — R2689 Other abnormalities of gait and mobility: Secondary | ICD-10-CM | POA: Diagnosis not present

## 2022-08-29 DIAGNOSIS — M6281 Muscle weakness (generalized): Secondary | ICD-10-CM | POA: Diagnosis not present

## 2022-08-30 DIAGNOSIS — M6281 Muscle weakness (generalized): Secondary | ICD-10-CM | POA: Diagnosis not present

## 2022-08-30 DIAGNOSIS — R2689 Other abnormalities of gait and mobility: Secondary | ICD-10-CM | POA: Diagnosis not present

## 2022-08-30 DIAGNOSIS — F0390 Unspecified dementia without behavioral disturbance: Secondary | ICD-10-CM | POA: Diagnosis not present

## 2022-08-31 DIAGNOSIS — R2689 Other abnormalities of gait and mobility: Secondary | ICD-10-CM | POA: Diagnosis not present

## 2022-08-31 DIAGNOSIS — M6281 Muscle weakness (generalized): Secondary | ICD-10-CM | POA: Diagnosis not present

## 2022-08-31 DIAGNOSIS — F0390 Unspecified dementia without behavioral disturbance: Secondary | ICD-10-CM | POA: Diagnosis not present

## 2022-09-01 DIAGNOSIS — M6281 Muscle weakness (generalized): Secondary | ICD-10-CM | POA: Diagnosis not present

## 2022-09-01 DIAGNOSIS — R2689 Other abnormalities of gait and mobility: Secondary | ICD-10-CM | POA: Diagnosis not present

## 2022-09-01 DIAGNOSIS — F0390 Unspecified dementia without behavioral disturbance: Secondary | ICD-10-CM | POA: Diagnosis not present

## 2022-09-02 DIAGNOSIS — F0390 Unspecified dementia without behavioral disturbance: Secondary | ICD-10-CM | POA: Diagnosis not present

## 2022-09-02 DIAGNOSIS — R2689 Other abnormalities of gait and mobility: Secondary | ICD-10-CM | POA: Diagnosis not present

## 2022-09-02 DIAGNOSIS — M6281 Muscle weakness (generalized): Secondary | ICD-10-CM | POA: Diagnosis not present

## 2022-09-05 DIAGNOSIS — M6281 Muscle weakness (generalized): Secondary | ICD-10-CM | POA: Diagnosis not present

## 2022-09-05 DIAGNOSIS — R2689 Other abnormalities of gait and mobility: Secondary | ICD-10-CM | POA: Diagnosis not present

## 2022-09-05 DIAGNOSIS — F0390 Unspecified dementia without behavioral disturbance: Secondary | ICD-10-CM | POA: Diagnosis not present

## 2022-09-06 DIAGNOSIS — M6281 Muscle weakness (generalized): Secondary | ICD-10-CM | POA: Diagnosis not present

## 2022-09-06 DIAGNOSIS — F0390 Unspecified dementia without behavioral disturbance: Secondary | ICD-10-CM | POA: Diagnosis not present

## 2022-09-06 DIAGNOSIS — R2689 Other abnormalities of gait and mobility: Secondary | ICD-10-CM | POA: Diagnosis not present

## 2022-09-07 DIAGNOSIS — F0390 Unspecified dementia without behavioral disturbance: Secondary | ICD-10-CM | POA: Diagnosis not present

## 2022-09-07 DIAGNOSIS — R2689 Other abnormalities of gait and mobility: Secondary | ICD-10-CM | POA: Diagnosis not present

## 2022-09-07 DIAGNOSIS — M6281 Muscle weakness (generalized): Secondary | ICD-10-CM | POA: Diagnosis not present

## 2022-09-08 DIAGNOSIS — R2689 Other abnormalities of gait and mobility: Secondary | ICD-10-CM | POA: Diagnosis not present

## 2022-09-08 DIAGNOSIS — F0390 Unspecified dementia without behavioral disturbance: Secondary | ICD-10-CM | POA: Diagnosis not present

## 2022-09-08 DIAGNOSIS — M6281 Muscle weakness (generalized): Secondary | ICD-10-CM | POA: Diagnosis not present

## 2022-09-09 DIAGNOSIS — M6281 Muscle weakness (generalized): Secondary | ICD-10-CM | POA: Diagnosis not present

## 2022-09-09 DIAGNOSIS — R2689 Other abnormalities of gait and mobility: Secondary | ICD-10-CM | POA: Diagnosis not present

## 2022-09-09 DIAGNOSIS — F0390 Unspecified dementia without behavioral disturbance: Secondary | ICD-10-CM | POA: Diagnosis not present

## 2022-09-12 DIAGNOSIS — F0390 Unspecified dementia without behavioral disturbance: Secondary | ICD-10-CM | POA: Diagnosis not present

## 2022-09-12 DIAGNOSIS — R2689 Other abnormalities of gait and mobility: Secondary | ICD-10-CM | POA: Diagnosis not present

## 2022-09-12 DIAGNOSIS — M6281 Muscle weakness (generalized): Secondary | ICD-10-CM | POA: Diagnosis not present

## 2022-09-13 DIAGNOSIS — F0390 Unspecified dementia without behavioral disturbance: Secondary | ICD-10-CM | POA: Diagnosis not present

## 2022-09-13 DIAGNOSIS — M6281 Muscle weakness (generalized): Secondary | ICD-10-CM | POA: Diagnosis not present

## 2022-09-13 DIAGNOSIS — R2689 Other abnormalities of gait and mobility: Secondary | ICD-10-CM | POA: Diagnosis not present

## 2022-09-14 DIAGNOSIS — F0390 Unspecified dementia without behavioral disturbance: Secondary | ICD-10-CM | POA: Diagnosis not present

## 2022-09-14 DIAGNOSIS — M6281 Muscle weakness (generalized): Secondary | ICD-10-CM | POA: Diagnosis not present

## 2022-09-14 DIAGNOSIS — R2689 Other abnormalities of gait and mobility: Secondary | ICD-10-CM | POA: Diagnosis not present

## 2022-09-15 DIAGNOSIS — R2689 Other abnormalities of gait and mobility: Secondary | ICD-10-CM | POA: Diagnosis not present

## 2022-09-15 DIAGNOSIS — F0390 Unspecified dementia without behavioral disturbance: Secondary | ICD-10-CM | POA: Diagnosis not present

## 2022-09-15 DIAGNOSIS — M6281 Muscle weakness (generalized): Secondary | ICD-10-CM | POA: Diagnosis not present

## 2022-09-16 DIAGNOSIS — F0390 Unspecified dementia without behavioral disturbance: Secondary | ICD-10-CM | POA: Diagnosis not present

## 2022-09-16 DIAGNOSIS — R2689 Other abnormalities of gait and mobility: Secondary | ICD-10-CM | POA: Diagnosis not present

## 2022-09-16 DIAGNOSIS — M6281 Muscle weakness (generalized): Secondary | ICD-10-CM | POA: Diagnosis not present

## 2022-09-19 DIAGNOSIS — R41841 Cognitive communication deficit: Secondary | ICD-10-CM | POA: Diagnosis not present

## 2022-09-19 DIAGNOSIS — F0390 Unspecified dementia without behavioral disturbance: Secondary | ICD-10-CM | POA: Diagnosis not present

## 2022-09-19 DIAGNOSIS — R1311 Dysphagia, oral phase: Secondary | ICD-10-CM | POA: Diagnosis not present

## 2022-09-19 DIAGNOSIS — R1312 Dysphagia, oropharyngeal phase: Secondary | ICD-10-CM | POA: Diagnosis not present

## 2022-09-19 DIAGNOSIS — R2689 Other abnormalities of gait and mobility: Secondary | ICD-10-CM | POA: Diagnosis not present

## 2022-09-19 DIAGNOSIS — M24571 Contracture, right ankle: Secondary | ICD-10-CM | POA: Diagnosis not present

## 2022-09-19 DIAGNOSIS — M6281 Muscle weakness (generalized): Secondary | ICD-10-CM | POA: Diagnosis not present

## 2022-09-19 DIAGNOSIS — M24572 Contracture, left ankle: Secondary | ICD-10-CM | POA: Diagnosis not present

## 2022-09-20 DIAGNOSIS — R41841 Cognitive communication deficit: Secondary | ICD-10-CM | POA: Diagnosis not present

## 2022-09-20 DIAGNOSIS — R1311 Dysphagia, oral phase: Secondary | ICD-10-CM | POA: Diagnosis not present

## 2022-09-20 DIAGNOSIS — M24572 Contracture, left ankle: Secondary | ICD-10-CM | POA: Diagnosis not present

## 2022-09-20 DIAGNOSIS — R1312 Dysphagia, oropharyngeal phase: Secondary | ICD-10-CM | POA: Diagnosis not present

## 2022-09-20 DIAGNOSIS — M24571 Contracture, right ankle: Secondary | ICD-10-CM | POA: Diagnosis not present

## 2022-09-20 DIAGNOSIS — M6281 Muscle weakness (generalized): Secondary | ICD-10-CM | POA: Diagnosis not present

## 2022-09-20 DIAGNOSIS — R2689 Other abnormalities of gait and mobility: Secondary | ICD-10-CM | POA: Diagnosis not present

## 2022-09-20 DIAGNOSIS — F0390 Unspecified dementia without behavioral disturbance: Secondary | ICD-10-CM | POA: Diagnosis not present

## 2022-09-22 DIAGNOSIS — R1312 Dysphagia, oropharyngeal phase: Secondary | ICD-10-CM | POA: Diagnosis not present

## 2022-09-22 DIAGNOSIS — M24571 Contracture, right ankle: Secondary | ICD-10-CM | POA: Diagnosis not present

## 2022-09-22 DIAGNOSIS — R41841 Cognitive communication deficit: Secondary | ICD-10-CM | POA: Diagnosis not present

## 2022-09-22 DIAGNOSIS — R2689 Other abnormalities of gait and mobility: Secondary | ICD-10-CM | POA: Diagnosis not present

## 2022-09-22 DIAGNOSIS — M24572 Contracture, left ankle: Secondary | ICD-10-CM | POA: Diagnosis not present

## 2022-09-22 DIAGNOSIS — F0390 Unspecified dementia without behavioral disturbance: Secondary | ICD-10-CM | POA: Diagnosis not present

## 2022-09-22 DIAGNOSIS — R1311 Dysphagia, oral phase: Secondary | ICD-10-CM | POA: Diagnosis not present

## 2022-09-22 DIAGNOSIS — M6281 Muscle weakness (generalized): Secondary | ICD-10-CM | POA: Diagnosis not present

## 2022-09-23 DIAGNOSIS — R2689 Other abnormalities of gait and mobility: Secondary | ICD-10-CM | POA: Diagnosis not present

## 2022-09-23 DIAGNOSIS — R1312 Dysphagia, oropharyngeal phase: Secondary | ICD-10-CM | POA: Diagnosis not present

## 2022-09-23 DIAGNOSIS — F0390 Unspecified dementia without behavioral disturbance: Secondary | ICD-10-CM | POA: Diagnosis not present

## 2022-09-23 DIAGNOSIS — R41841 Cognitive communication deficit: Secondary | ICD-10-CM | POA: Diagnosis not present

## 2022-09-23 DIAGNOSIS — M24572 Contracture, left ankle: Secondary | ICD-10-CM | POA: Diagnosis not present

## 2022-09-23 DIAGNOSIS — R1311 Dysphagia, oral phase: Secondary | ICD-10-CM | POA: Diagnosis not present

## 2022-09-23 DIAGNOSIS — M6281 Muscle weakness (generalized): Secondary | ICD-10-CM | POA: Diagnosis not present

## 2022-09-23 DIAGNOSIS — M24571 Contracture, right ankle: Secondary | ICD-10-CM | POA: Diagnosis not present

## 2022-09-26 DIAGNOSIS — M6281 Muscle weakness (generalized): Secondary | ICD-10-CM | POA: Diagnosis not present

## 2022-09-26 DIAGNOSIS — R1311 Dysphagia, oral phase: Secondary | ICD-10-CM | POA: Diagnosis not present

## 2022-09-26 DIAGNOSIS — R1312 Dysphagia, oropharyngeal phase: Secondary | ICD-10-CM | POA: Diagnosis not present

## 2022-09-26 DIAGNOSIS — R41841 Cognitive communication deficit: Secondary | ICD-10-CM | POA: Diagnosis not present

## 2022-09-26 DIAGNOSIS — M24572 Contracture, left ankle: Secondary | ICD-10-CM | POA: Diagnosis not present

## 2022-09-26 DIAGNOSIS — R2689 Other abnormalities of gait and mobility: Secondary | ICD-10-CM | POA: Diagnosis not present

## 2022-09-26 DIAGNOSIS — F0390 Unspecified dementia without behavioral disturbance: Secondary | ICD-10-CM | POA: Diagnosis not present

## 2022-09-26 DIAGNOSIS — M24571 Contracture, right ankle: Secondary | ICD-10-CM | POA: Diagnosis not present

## 2022-09-27 DIAGNOSIS — M24571 Contracture, right ankle: Secondary | ICD-10-CM | POA: Diagnosis not present

## 2022-09-27 DIAGNOSIS — R1312 Dysphagia, oropharyngeal phase: Secondary | ICD-10-CM | POA: Diagnosis not present

## 2022-09-27 DIAGNOSIS — R2689 Other abnormalities of gait and mobility: Secondary | ICD-10-CM | POA: Diagnosis not present

## 2022-09-27 DIAGNOSIS — R41841 Cognitive communication deficit: Secondary | ICD-10-CM | POA: Diagnosis not present

## 2022-09-27 DIAGNOSIS — F0390 Unspecified dementia without behavioral disturbance: Secondary | ICD-10-CM | POA: Diagnosis not present

## 2022-09-27 DIAGNOSIS — M6281 Muscle weakness (generalized): Secondary | ICD-10-CM | POA: Diagnosis not present

## 2022-09-27 DIAGNOSIS — R1311 Dysphagia, oral phase: Secondary | ICD-10-CM | POA: Diagnosis not present

## 2022-09-27 DIAGNOSIS — M24572 Contracture, left ankle: Secondary | ICD-10-CM | POA: Diagnosis not present

## 2022-09-28 DIAGNOSIS — R1311 Dysphagia, oral phase: Secondary | ICD-10-CM | POA: Diagnosis not present

## 2022-09-28 DIAGNOSIS — M6281 Muscle weakness (generalized): Secondary | ICD-10-CM | POA: Diagnosis not present

## 2022-09-28 DIAGNOSIS — R1312 Dysphagia, oropharyngeal phase: Secondary | ICD-10-CM | POA: Diagnosis not present

## 2022-09-28 DIAGNOSIS — F0390 Unspecified dementia without behavioral disturbance: Secondary | ICD-10-CM | POA: Diagnosis not present

## 2022-09-28 DIAGNOSIS — M24572 Contracture, left ankle: Secondary | ICD-10-CM | POA: Diagnosis not present

## 2022-09-28 DIAGNOSIS — M24571 Contracture, right ankle: Secondary | ICD-10-CM | POA: Diagnosis not present

## 2022-09-28 DIAGNOSIS — R41841 Cognitive communication deficit: Secondary | ICD-10-CM | POA: Diagnosis not present

## 2022-09-28 DIAGNOSIS — R2689 Other abnormalities of gait and mobility: Secondary | ICD-10-CM | POA: Diagnosis not present

## 2022-09-29 DIAGNOSIS — R41841 Cognitive communication deficit: Secondary | ICD-10-CM | POA: Diagnosis not present

## 2022-09-29 DIAGNOSIS — R1311 Dysphagia, oral phase: Secondary | ICD-10-CM | POA: Diagnosis not present

## 2022-09-29 DIAGNOSIS — R2689 Other abnormalities of gait and mobility: Secondary | ICD-10-CM | POA: Diagnosis not present

## 2022-09-29 DIAGNOSIS — M24572 Contracture, left ankle: Secondary | ICD-10-CM | POA: Diagnosis not present

## 2022-09-29 DIAGNOSIS — F0390 Unspecified dementia without behavioral disturbance: Secondary | ICD-10-CM | POA: Diagnosis not present

## 2022-09-29 DIAGNOSIS — M24571 Contracture, right ankle: Secondary | ICD-10-CM | POA: Diagnosis not present

## 2022-09-29 DIAGNOSIS — R1312 Dysphagia, oropharyngeal phase: Secondary | ICD-10-CM | POA: Diagnosis not present

## 2022-09-29 DIAGNOSIS — M6281 Muscle weakness (generalized): Secondary | ICD-10-CM | POA: Diagnosis not present

## 2022-10-01 DIAGNOSIS — M24572 Contracture, left ankle: Secondary | ICD-10-CM | POA: Diagnosis not present

## 2022-10-01 DIAGNOSIS — F0390 Unspecified dementia without behavioral disturbance: Secondary | ICD-10-CM | POA: Diagnosis not present

## 2022-10-01 DIAGNOSIS — R41841 Cognitive communication deficit: Secondary | ICD-10-CM | POA: Diagnosis not present

## 2022-10-01 DIAGNOSIS — R1312 Dysphagia, oropharyngeal phase: Secondary | ICD-10-CM | POA: Diagnosis not present

## 2022-10-01 DIAGNOSIS — R2689 Other abnormalities of gait and mobility: Secondary | ICD-10-CM | POA: Diagnosis not present

## 2022-10-01 DIAGNOSIS — M24571 Contracture, right ankle: Secondary | ICD-10-CM | POA: Diagnosis not present

## 2022-10-01 DIAGNOSIS — M6281 Muscle weakness (generalized): Secondary | ICD-10-CM | POA: Diagnosis not present

## 2022-10-01 DIAGNOSIS — R1311 Dysphagia, oral phase: Secondary | ICD-10-CM | POA: Diagnosis not present

## 2022-10-02 DIAGNOSIS — M6281 Muscle weakness (generalized): Secondary | ICD-10-CM | POA: Diagnosis not present

## 2022-10-02 DIAGNOSIS — R2689 Other abnormalities of gait and mobility: Secondary | ICD-10-CM | POA: Diagnosis not present

## 2022-10-02 DIAGNOSIS — F0390 Unspecified dementia without behavioral disturbance: Secondary | ICD-10-CM | POA: Diagnosis not present

## 2022-10-02 DIAGNOSIS — M24572 Contracture, left ankle: Secondary | ICD-10-CM | POA: Diagnosis not present

## 2022-10-02 DIAGNOSIS — R1312 Dysphagia, oropharyngeal phase: Secondary | ICD-10-CM | POA: Diagnosis not present

## 2022-10-02 DIAGNOSIS — R41841 Cognitive communication deficit: Secondary | ICD-10-CM | POA: Diagnosis not present

## 2022-10-02 DIAGNOSIS — R1311 Dysphagia, oral phase: Secondary | ICD-10-CM | POA: Diagnosis not present

## 2022-10-02 DIAGNOSIS — M24571 Contracture, right ankle: Secondary | ICD-10-CM | POA: Diagnosis not present

## 2022-10-04 DIAGNOSIS — M24572 Contracture, left ankle: Secondary | ICD-10-CM | POA: Diagnosis not present

## 2022-10-04 DIAGNOSIS — F0390 Unspecified dementia without behavioral disturbance: Secondary | ICD-10-CM | POA: Diagnosis not present

## 2022-10-04 DIAGNOSIS — R1311 Dysphagia, oral phase: Secondary | ICD-10-CM | POA: Diagnosis not present

## 2022-10-04 DIAGNOSIS — I509 Heart failure, unspecified: Secondary | ICD-10-CM | POA: Diagnosis not present

## 2022-10-04 DIAGNOSIS — D649 Anemia, unspecified: Secondary | ICD-10-CM | POA: Diagnosis not present

## 2022-10-04 DIAGNOSIS — R1312 Dysphagia, oropharyngeal phase: Secondary | ICD-10-CM | POA: Diagnosis not present

## 2022-10-04 DIAGNOSIS — I110 Hypertensive heart disease with heart failure: Secondary | ICD-10-CM | POA: Diagnosis not present

## 2022-10-04 DIAGNOSIS — M6281 Muscle weakness (generalized): Secondary | ICD-10-CM | POA: Diagnosis not present

## 2022-10-04 DIAGNOSIS — J310 Chronic rhinitis: Secondary | ICD-10-CM | POA: Diagnosis not present

## 2022-10-04 DIAGNOSIS — M24571 Contracture, right ankle: Secondary | ICD-10-CM | POA: Diagnosis not present

## 2022-10-04 DIAGNOSIS — R2689 Other abnormalities of gait and mobility: Secondary | ICD-10-CM | POA: Diagnosis not present

## 2022-10-04 DIAGNOSIS — R41841 Cognitive communication deficit: Secondary | ICD-10-CM | POA: Diagnosis not present

## 2022-10-04 DIAGNOSIS — R067 Sneezing: Secondary | ICD-10-CM | POA: Diagnosis not present

## 2022-10-04 DIAGNOSIS — R059 Cough, unspecified: Secondary | ICD-10-CM | POA: Diagnosis not present

## 2022-10-04 DIAGNOSIS — E785 Hyperlipidemia, unspecified: Secondary | ICD-10-CM | POA: Diagnosis not present

## 2022-10-04 DIAGNOSIS — E1122 Type 2 diabetes mellitus with diabetic chronic kidney disease: Secondary | ICD-10-CM | POA: Diagnosis not present

## 2022-10-05 DIAGNOSIS — R1311 Dysphagia, oral phase: Secondary | ICD-10-CM | POA: Diagnosis not present

## 2022-10-05 DIAGNOSIS — F0390 Unspecified dementia without behavioral disturbance: Secondary | ICD-10-CM | POA: Diagnosis not present

## 2022-10-05 DIAGNOSIS — M6281 Muscle weakness (generalized): Secondary | ICD-10-CM | POA: Diagnosis not present

## 2022-10-05 DIAGNOSIS — R2689 Other abnormalities of gait and mobility: Secondary | ICD-10-CM | POA: Diagnosis not present

## 2022-10-05 DIAGNOSIS — R1312 Dysphagia, oropharyngeal phase: Secondary | ICD-10-CM | POA: Diagnosis not present

## 2022-10-05 DIAGNOSIS — M24571 Contracture, right ankle: Secondary | ICD-10-CM | POA: Diagnosis not present

## 2022-10-05 DIAGNOSIS — M24572 Contracture, left ankle: Secondary | ICD-10-CM | POA: Diagnosis not present

## 2022-10-05 DIAGNOSIS — R41841 Cognitive communication deficit: Secondary | ICD-10-CM | POA: Diagnosis not present

## 2022-10-06 DIAGNOSIS — R1312 Dysphagia, oropharyngeal phase: Secondary | ICD-10-CM | POA: Diagnosis not present

## 2022-10-06 DIAGNOSIS — R1311 Dysphagia, oral phase: Secondary | ICD-10-CM | POA: Diagnosis not present

## 2022-10-06 DIAGNOSIS — M24571 Contracture, right ankle: Secondary | ICD-10-CM | POA: Diagnosis not present

## 2022-10-06 DIAGNOSIS — F0390 Unspecified dementia without behavioral disturbance: Secondary | ICD-10-CM | POA: Diagnosis not present

## 2022-10-06 DIAGNOSIS — R2689 Other abnormalities of gait and mobility: Secondary | ICD-10-CM | POA: Diagnosis not present

## 2022-10-06 DIAGNOSIS — M6281 Muscle weakness (generalized): Secondary | ICD-10-CM | POA: Diagnosis not present

## 2022-10-06 DIAGNOSIS — R41841 Cognitive communication deficit: Secondary | ICD-10-CM | POA: Diagnosis not present

## 2022-10-06 DIAGNOSIS — M24572 Contracture, left ankle: Secondary | ICD-10-CM | POA: Diagnosis not present

## 2022-10-07 DIAGNOSIS — F0390 Unspecified dementia without behavioral disturbance: Secondary | ICD-10-CM | POA: Diagnosis not present

## 2022-10-07 DIAGNOSIS — M24571 Contracture, right ankle: Secondary | ICD-10-CM | POA: Diagnosis not present

## 2022-10-07 DIAGNOSIS — R1311 Dysphagia, oral phase: Secondary | ICD-10-CM | POA: Diagnosis not present

## 2022-10-07 DIAGNOSIS — I10 Essential (primary) hypertension: Secondary | ICD-10-CM | POA: Diagnosis not present

## 2022-10-07 DIAGNOSIS — R2689 Other abnormalities of gait and mobility: Secondary | ICD-10-CM | POA: Diagnosis not present

## 2022-10-07 DIAGNOSIS — M6281 Muscle weakness (generalized): Secondary | ICD-10-CM | POA: Diagnosis not present

## 2022-10-07 DIAGNOSIS — R1312 Dysphagia, oropharyngeal phase: Secondary | ICD-10-CM | POA: Diagnosis not present

## 2022-10-07 DIAGNOSIS — D510 Vitamin B12 deficiency anemia due to intrinsic factor deficiency: Secondary | ICD-10-CM | POA: Diagnosis not present

## 2022-10-07 DIAGNOSIS — E119 Type 2 diabetes mellitus without complications: Secondary | ICD-10-CM | POA: Diagnosis not present

## 2022-10-07 DIAGNOSIS — M24572 Contracture, left ankle: Secondary | ICD-10-CM | POA: Diagnosis not present

## 2022-10-07 DIAGNOSIS — E7800 Pure hypercholesterolemia, unspecified: Secondary | ICD-10-CM | POA: Diagnosis not present

## 2022-10-07 DIAGNOSIS — E559 Vitamin D deficiency, unspecified: Secondary | ICD-10-CM | POA: Diagnosis not present

## 2022-10-07 DIAGNOSIS — R41841 Cognitive communication deficit: Secondary | ICD-10-CM | POA: Diagnosis not present

## 2022-10-10 DIAGNOSIS — R1312 Dysphagia, oropharyngeal phase: Secondary | ICD-10-CM | POA: Diagnosis not present

## 2022-10-10 DIAGNOSIS — F0390 Unspecified dementia without behavioral disturbance: Secondary | ICD-10-CM | POA: Diagnosis not present

## 2022-10-10 DIAGNOSIS — R1311 Dysphagia, oral phase: Secondary | ICD-10-CM | POA: Diagnosis not present

## 2022-10-10 DIAGNOSIS — M6281 Muscle weakness (generalized): Secondary | ICD-10-CM | POA: Diagnosis not present

## 2022-10-10 DIAGNOSIS — M24572 Contracture, left ankle: Secondary | ICD-10-CM | POA: Diagnosis not present

## 2022-10-10 DIAGNOSIS — M24571 Contracture, right ankle: Secondary | ICD-10-CM | POA: Diagnosis not present

## 2022-10-10 DIAGNOSIS — R2689 Other abnormalities of gait and mobility: Secondary | ICD-10-CM | POA: Diagnosis not present

## 2022-10-10 DIAGNOSIS — R41841 Cognitive communication deficit: Secondary | ICD-10-CM | POA: Diagnosis not present

## 2022-10-11 DIAGNOSIS — M6281 Muscle weakness (generalized): Secondary | ICD-10-CM | POA: Diagnosis not present

## 2022-10-11 DIAGNOSIS — R1312 Dysphagia, oropharyngeal phase: Secondary | ICD-10-CM | POA: Diagnosis not present

## 2022-10-11 DIAGNOSIS — R41841 Cognitive communication deficit: Secondary | ICD-10-CM | POA: Diagnosis not present

## 2022-10-11 DIAGNOSIS — R1311 Dysphagia, oral phase: Secondary | ICD-10-CM | POA: Diagnosis not present

## 2022-10-11 DIAGNOSIS — M24571 Contracture, right ankle: Secondary | ICD-10-CM | POA: Diagnosis not present

## 2022-10-11 DIAGNOSIS — M24572 Contracture, left ankle: Secondary | ICD-10-CM | POA: Diagnosis not present

## 2022-10-11 DIAGNOSIS — F0390 Unspecified dementia without behavioral disturbance: Secondary | ICD-10-CM | POA: Diagnosis not present

## 2022-10-11 DIAGNOSIS — R2689 Other abnormalities of gait and mobility: Secondary | ICD-10-CM | POA: Diagnosis not present

## 2022-10-12 DIAGNOSIS — F0390 Unspecified dementia without behavioral disturbance: Secondary | ICD-10-CM | POA: Diagnosis not present

## 2022-10-12 DIAGNOSIS — M6281 Muscle weakness (generalized): Secondary | ICD-10-CM | POA: Diagnosis not present

## 2022-10-12 DIAGNOSIS — M24572 Contracture, left ankle: Secondary | ICD-10-CM | POA: Diagnosis not present

## 2022-10-12 DIAGNOSIS — R1311 Dysphagia, oral phase: Secondary | ICD-10-CM | POA: Diagnosis not present

## 2022-10-12 DIAGNOSIS — R2689 Other abnormalities of gait and mobility: Secondary | ICD-10-CM | POA: Diagnosis not present

## 2022-10-12 DIAGNOSIS — M24571 Contracture, right ankle: Secondary | ICD-10-CM | POA: Diagnosis not present

## 2022-10-12 DIAGNOSIS — R41841 Cognitive communication deficit: Secondary | ICD-10-CM | POA: Diagnosis not present

## 2022-10-12 DIAGNOSIS — R1312 Dysphagia, oropharyngeal phase: Secondary | ICD-10-CM | POA: Diagnosis not present

## 2022-10-13 DIAGNOSIS — M6281 Muscle weakness (generalized): Secondary | ICD-10-CM | POA: Diagnosis not present

## 2022-10-13 DIAGNOSIS — R1311 Dysphagia, oral phase: Secondary | ICD-10-CM | POA: Diagnosis not present

## 2022-10-13 DIAGNOSIS — F0390 Unspecified dementia without behavioral disturbance: Secondary | ICD-10-CM | POA: Diagnosis not present

## 2022-10-13 DIAGNOSIS — M24571 Contracture, right ankle: Secondary | ICD-10-CM | POA: Diagnosis not present

## 2022-10-13 DIAGNOSIS — R1312 Dysphagia, oropharyngeal phase: Secondary | ICD-10-CM | POA: Diagnosis not present

## 2022-10-13 DIAGNOSIS — R2689 Other abnormalities of gait and mobility: Secondary | ICD-10-CM | POA: Diagnosis not present

## 2022-10-13 DIAGNOSIS — R41841 Cognitive communication deficit: Secondary | ICD-10-CM | POA: Diagnosis not present

## 2022-10-13 DIAGNOSIS — M24572 Contracture, left ankle: Secondary | ICD-10-CM | POA: Diagnosis not present

## 2022-10-14 DIAGNOSIS — E1151 Type 2 diabetes mellitus with diabetic peripheral angiopathy without gangrene: Secondary | ICD-10-CM | POA: Diagnosis not present

## 2022-10-14 DIAGNOSIS — R1312 Dysphagia, oropharyngeal phase: Secondary | ICD-10-CM | POA: Diagnosis not present

## 2022-10-14 DIAGNOSIS — Z794 Long term (current) use of insulin: Secondary | ICD-10-CM | POA: Diagnosis not present

## 2022-10-14 DIAGNOSIS — E1122 Type 2 diabetes mellitus with diabetic chronic kidney disease: Secondary | ICD-10-CM | POA: Diagnosis not present

## 2022-10-14 DIAGNOSIS — R1311 Dysphagia, oral phase: Secondary | ICD-10-CM | POA: Diagnosis not present

## 2022-10-14 DIAGNOSIS — I739 Peripheral vascular disease, unspecified: Secondary | ICD-10-CM | POA: Diagnosis not present

## 2022-10-14 DIAGNOSIS — N1832 Chronic kidney disease, stage 3b: Secondary | ICD-10-CM | POA: Diagnosis not present

## 2022-10-14 DIAGNOSIS — R41841 Cognitive communication deficit: Secondary | ICD-10-CM | POA: Diagnosis not present

## 2022-10-14 DIAGNOSIS — Z89429 Acquired absence of other toe(s), unspecified side: Secondary | ICD-10-CM | POA: Diagnosis not present

## 2022-10-14 DIAGNOSIS — F0390 Unspecified dementia without behavioral disturbance: Secondary | ICD-10-CM | POA: Diagnosis not present

## 2022-10-14 DIAGNOSIS — M24576 Contracture, unspecified foot: Secondary | ICD-10-CM | POA: Diagnosis not present

## 2022-10-14 DIAGNOSIS — R2689 Other abnormalities of gait and mobility: Secondary | ICD-10-CM | POA: Diagnosis not present

## 2022-10-14 DIAGNOSIS — M24571 Contracture, right ankle: Secondary | ICD-10-CM | POA: Diagnosis not present

## 2022-10-14 DIAGNOSIS — M24572 Contracture, left ankle: Secondary | ICD-10-CM | POA: Diagnosis not present

## 2022-10-14 DIAGNOSIS — Z7982 Long term (current) use of aspirin: Secondary | ICD-10-CM | POA: Diagnosis not present

## 2022-10-14 DIAGNOSIS — B351 Tinea unguium: Secondary | ICD-10-CM | POA: Diagnosis not present

## 2022-10-14 DIAGNOSIS — M6281 Muscle weakness (generalized): Secondary | ICD-10-CM | POA: Diagnosis not present

## 2022-10-14 DIAGNOSIS — M24573 Contracture, unspecified ankle: Secondary | ICD-10-CM | POA: Diagnosis not present

## 2022-10-17 DIAGNOSIS — M24571 Contracture, right ankle: Secondary | ICD-10-CM | POA: Diagnosis not present

## 2022-10-17 DIAGNOSIS — F0390 Unspecified dementia without behavioral disturbance: Secondary | ICD-10-CM | POA: Diagnosis not present

## 2022-10-17 DIAGNOSIS — R1312 Dysphagia, oropharyngeal phase: Secondary | ICD-10-CM | POA: Diagnosis not present

## 2022-10-17 DIAGNOSIS — R41841 Cognitive communication deficit: Secondary | ICD-10-CM | POA: Diagnosis not present

## 2022-10-17 DIAGNOSIS — R1311 Dysphagia, oral phase: Secondary | ICD-10-CM | POA: Diagnosis not present

## 2022-10-17 DIAGNOSIS — M24572 Contracture, left ankle: Secondary | ICD-10-CM | POA: Diagnosis not present

## 2022-10-17 DIAGNOSIS — R2689 Other abnormalities of gait and mobility: Secondary | ICD-10-CM | POA: Diagnosis not present

## 2022-10-17 DIAGNOSIS — M6281 Muscle weakness (generalized): Secondary | ICD-10-CM | POA: Diagnosis not present

## 2022-10-18 DIAGNOSIS — R41841 Cognitive communication deficit: Secondary | ICD-10-CM | POA: Diagnosis not present

## 2022-10-18 DIAGNOSIS — M24572 Contracture, left ankle: Secondary | ICD-10-CM | POA: Diagnosis not present

## 2022-10-18 DIAGNOSIS — R1311 Dysphagia, oral phase: Secondary | ICD-10-CM | POA: Diagnosis not present

## 2022-10-18 DIAGNOSIS — R1312 Dysphagia, oropharyngeal phase: Secondary | ICD-10-CM | POA: Diagnosis not present

## 2022-10-18 DIAGNOSIS — M24571 Contracture, right ankle: Secondary | ICD-10-CM | POA: Diagnosis not present

## 2022-10-18 DIAGNOSIS — R2689 Other abnormalities of gait and mobility: Secondary | ICD-10-CM | POA: Diagnosis not present

## 2022-10-18 DIAGNOSIS — M6281 Muscle weakness (generalized): Secondary | ICD-10-CM | POA: Diagnosis not present

## 2022-10-18 DIAGNOSIS — F0390 Unspecified dementia without behavioral disturbance: Secondary | ICD-10-CM | POA: Diagnosis not present

## 2023-05-29 ENCOUNTER — Other Ambulatory Visit: Payer: Self-pay

## 2023-05-29 DIAGNOSIS — I739 Peripheral vascular disease, unspecified: Secondary | ICD-10-CM

## 2023-06-07 NOTE — Progress Notes (Signed)
Patient ID: David Ford, male   DOB: 08-11-1940, 82 y.o.   MRN: [id number]  Reason for Consult: No chief complaint on file.   Referred by Sherin Quarry, DPM  Subjective:     HPI  David Ford is a 82 y.o. male with HTN, HLD, CKD and diabetes presenting for evaluation of PAD.  He has had a history of osteomyelitis of the left foot with healed first and second toe amputations.  He currently denies claudication, rest pain or tissue loss.  He is a never smoker.  Past Medical History:  Diagnosis Date   AKI (acute kidney injury) (HCC) 06/18/2015   Cancer (HCC)    hx of prostate cancer   Chronic kidney disease    Diabetes mellitus without complication (HCC)    DKA (diabetic ketoacidoses) 06/18/2015   Glaucoma    Hyperlipidemia    Hypertension    Family History  Problem Relation Age of Onset   Diabetes Mother    Hypertension Mother    Stroke Mother    Diabetes Sister    Glaucoma Sister    Diabetes Brother    Glaucoma Brother    Amblyopia Neg Hx    Blindness Neg Hx    Cataracts Neg Hx    Macular degeneration Neg Hx    Retinal detachment Neg Hx    Strabismus Neg Hx    Retinitis pigmentosa Neg Hx    Past Surgical History:  Procedure Laterality Date   AMPUTATION Left 07/03/2015   Procedure: Left Foot 1st and 2nd Ray Amputation;  Surgeon: Nadara Mustard, MD;  Location: [facility] OR;  Service: Orthopedics;  Laterality: Left;   BASCILIC VEIN TRANSPOSITION Left 07/14/2015   Procedure: BRACHIOBASILIC VEIN TRANSPOSITION  ;  Surgeon: Sherren Kerns, MD;  Location: [facility] OR;  Service: Vascular;  Laterality: Left;   IRIDOTOMY / IRIDECTOMY     prostate seeds      Short Social History:  Social History   Tobacco Use   Smoking status: Never   Smokeless tobacco: Never  Substance Use Topics   Alcohol use: Yes    Comment: once a week    No Known Allergies  Current Outpatient Medications  Medication Sig Dispense Refill   amLODipine (NORVASC) 10 MG tablet Take 1 tablet (10 mg total)  by mouth daily. 90 tablet 0   aspirin EC 325 MG EC tablet Take 1 tablet (325 mg total) by mouth daily. 30 tablet 0   atorvastatin (LIPITOR) 10 MG tablet Take 1 tablet (10 mg total) by mouth daily. (Patient not taking: Reported on 12/20/2021) 90 tablet 1   atorvastatin (LIPITOR) 20 MG tablet Take 20 mg by mouth daily.     Blood Glucose Monitoring Suppl (TRUE METRIX METER) w/Device KIT USE AS DIRECTED 1 kit 0   brimonidine (ALPHAGAN) 0.2 % ophthalmic solution Place 1 drop into both eyes in the morning and at bedtime.     carvedilol (COREG) 3.125 MG tablet Take 1 tablet (3.125 mg total) by mouth 2 (two) times daily with a meal. 180 tablet 3   dorzolamide (TRUSOPT) 2 % ophthalmic solution SMARTSIG:In Eye(s)     dorzolamide-timolol (COSOPT) 22.3-6.8 MG/ML ophthalmic solution Place 1 drop into both eyes 2 (two) times daily.      doxycycline (VIBRAMYCIN) 100 MG capsule Take 100 mg by mouth 2 (two) times daily.     feeding supplement (ENSURE ENLIVE / ENSURE PLUS) LIQD Take 237 mLs by mouth 2 (two) times daily between meals. 237 mL 12  fluticasone (FLONASE) 50 MCG/ACT nasal spray Place 2 sprays into both nostrils daily. (Patient not taking: Reported on 12/20/2021) 16 g 6   insulin aspart (NOVOLOG) 100 UNIT/ML injection Inject 0-9 Units into the skin every 4 (four) hours. 10 mL 11   insulin glargine (LANTUS SOLOSTAR) 100 UNIT/ML Solostar Pen Inject 5 Units into the skin daily. 45 mL 0   Insulin Pen Needle (PEN NEEDLES) 32G X 5 MM MISC 1 application by Does not apply route 4 (four) times daily as needed (to inject insulin). 100 each 11   latanoprost (XALATAN) 0.005 % ophthalmic solution INSTILL 1 DROP INTO BOTH EYES AT BEDTIME (Patient taking differently: Place 1 drop into both eyes at bedtime.) 3 mL 3   levofloxacin (LEVAQUIN) 750 MG tablet Take 750 mg by mouth daily.     pantoprazole (PROTONIX) 40 MG tablet Take 1 tablet (40 mg total) by mouth 2 (two) times daily. (Patient not taking: Reported on 12/20/2021) 180  tablet 3   polyethylene glycol (MIRALAX) 17 g packet Take 17 g by mouth daily as needed for mild constipation. 14 each 0   sertraline (ZOLOFT) 25 MG tablet Take 1 tablet (25 mg total) by mouth daily. (Patient not taking: Reported on 12/20/2021) 25 tablet 3   tamsulosin (FLOMAX) 0.4 MG CAPS capsule Take 1 capsule (0.4 mg total) by mouth daily. (Patient taking differently: Take 0.4 mg by mouth at bedtime.) 90 capsule 3   traZODone (DESYREL) 150 MG tablet Take 1 tablet (150 mg total) by mouth at bedtime as needed for up to 5 days for sleep. 5 tablet 0   triamcinolone ointment (KENALOG) 0.5 % APPLY TOPICALLY 2 TIMES DAILY (Patient taking differently: Apply 1 application  topically 2 (two) times daily as needed (rash).) 30 g 2   TRUE METRIX BLOOD GLUCOSE TEST test strip TEST BLOOD SUGAR FOUR TIMES DAILY AND AS NEEDED (Patient taking differently: 3 (three) times daily.) 450 strip 1   TRUEplus Lancets 33G MISC CHECK BLOOD SUGAR FOUR TIMES DAILY  AND AS NEEDED (Patient taking differently: 1 each by Other route 3 (three) times daily.) 500 each 3   Vitamin D3 (VITAMIN D) 25 MCG tablet Take 1,000 Units by mouth daily.     No current facility-administered medications for this visit.    REVIEW OF SYSTEMS   All other systems were reviewed and are negative     Objective:  Objective   Vitals:   06/09/23 0956  BP: (!) 184/75  Pulse: 66  Resp: 20  Temp: 97.7 F (36.5 C)  SpO2: 98%   There is no height or weight on file to calculate BMI.  Physical Exam General: no acute distress Cardiac: hemodynamically stable, nontachycardic Pulm: normal work of breathing Neuro: alert, no focal deficit Extremities: Healed toe amputations on the left, no wounds on the right, no edema bilaterally Vascular:   Right: Palpable DP  Left: Palpable DP   Data: ABI: Unable to be obtained today due to patient's inability to get into the bed  Echo from 2023 reviewed EF is 45 to 50%, RSVP is normal, no valvular  abnormalities.  Most recent CMP from July 2023 reviewed Creatinine 1.93 at that time     Assessment/Plan:     David Ford is a 82 y.o. male with HTN, HLD, CKD and diabetes presenting for evaluation of PAD.  He has had previous toe amputations on the left that have healed.  He has no signs or symptoms of PAD and has palpable DP pulses bilaterally.  Continue incisional care for heel toe amputations Encourage ambulation Follow-up as needed   Daria Pastures MD [facility]

## 2023-06-09 ENCOUNTER — Ambulatory Visit (HOSPITAL_COMMUNITY)
Admission: RE | Admit: 2023-06-09 | Discharge: 2023-06-09 | Disposition: A | Discharge status: Home / Self Care | Payer: Medicare Other | Source: Ambulatory Visit | Attending: Vascular Surgery | Admitting: Vascular Surgery

## 2023-06-09 ENCOUNTER — Encounter: Payer: Self-pay | Admitting: Vascular Surgery

## 2023-06-09 ENCOUNTER — Ambulatory Visit (INDEPENDENT_AMBULATORY_CARE_PROVIDER_SITE_OTHER): Payer: Medicare Other | Admitting: Vascular Surgery

## 2023-06-09 VITALS — BP 184/75 | HR 66 | Temp 97.7°F | Resp 20

## 2023-06-09 DIAGNOSIS — I739 Peripheral vascular disease, unspecified: Secondary | ICD-10-CM

## 2023-06-09 DIAGNOSIS — E1169 Type 2 diabetes mellitus with other specified complication: Secondary | ICD-10-CM

## 2023-06-09 DIAGNOSIS — I70221 Atherosclerosis of native arteries of extremities with rest pain, right leg: Secondary | ICD-10-CM
